# Patient Record
Sex: Male | Born: 1935 | Race: White | Hispanic: No | Marital: Married | Smoking: Former smoker
Health system: Southern US, Community
[De-identification: ages and names within clinical notes are randomized; demographics above are authoritative.]

## PROBLEM LIST (undated history)

## (undated) DIAGNOSIS — I4510 Unspecified right bundle-branch block: Secondary | ICD-10-CM

## (undated) DIAGNOSIS — E669 Obesity, unspecified: Secondary | ICD-10-CM

## (undated) DIAGNOSIS — M5135 Other intervertebral disc degeneration, thoracolumbar region: Secondary | ICD-10-CM

## (undated) DIAGNOSIS — Z7901 Long term (current) use of anticoagulants: Secondary | ICD-10-CM

## (undated) DIAGNOSIS — R972 Elevated prostate specific antigen [PSA]: Secondary | ICD-10-CM

## (undated) DIAGNOSIS — Z8739 Personal history of other diseases of the musculoskeletal system and connective tissue: Secondary | ICD-10-CM

## (undated) DIAGNOSIS — I2510 Atherosclerotic heart disease of native coronary artery without angina pectoris: Secondary | ICD-10-CM

## (undated) DIAGNOSIS — E785 Hyperlipidemia, unspecified: Secondary | ICD-10-CM

## (undated) DIAGNOSIS — N138 Other obstructive and reflux uropathy: Secondary | ICD-10-CM

## (undated) DIAGNOSIS — N400 Enlarged prostate without lower urinary tract symptoms: Secondary | ICD-10-CM

## (undated) DIAGNOSIS — M47814 Spondylosis without myelopathy or radiculopathy, thoracic region: Secondary | ICD-10-CM

## (undated) DIAGNOSIS — N401 Enlarged prostate with lower urinary tract symptoms: Secondary | ICD-10-CM

## (undated) DIAGNOSIS — I4891 Unspecified atrial fibrillation: Secondary | ICD-10-CM

## (undated) DIAGNOSIS — I10 Essential (primary) hypertension: Secondary | ICD-10-CM

## (undated) DIAGNOSIS — D369 Benign neoplasm, unspecified site: Secondary | ICD-10-CM

## (undated) DIAGNOSIS — J309 Allergic rhinitis, unspecified: Secondary | ICD-10-CM

## (undated) DIAGNOSIS — M51369 Other intervertebral disc degeneration, lumbar region without mention of lumbar back pain or lower extremity pain: Secondary | ICD-10-CM

## (undated) DIAGNOSIS — K4090 Unilateral inguinal hernia, without obstruction or gangrene, not specified as recurrent: Secondary | ICD-10-CM

## (undated) DIAGNOSIS — I82409 Acute embolism and thrombosis of unspecified deep veins of unspecified lower extremity: Secondary | ICD-10-CM

## (undated) DIAGNOSIS — M5136 Other intervertebral disc degeneration, lumbar region: Secondary | ICD-10-CM

## (undated) DIAGNOSIS — I872 Venous insufficiency (chronic) (peripheral): Secondary | ICD-10-CM

## (undated) DIAGNOSIS — M1990 Unspecified osteoarthritis, unspecified site: Secondary | ICD-10-CM

## (undated) DIAGNOSIS — E882 Lipomatosis, not elsewhere classified: Secondary | ICD-10-CM

## (undated) DIAGNOSIS — R61 Generalized hyperhidrosis: Secondary | ICD-10-CM

## (undated) DIAGNOSIS — N281 Cyst of kidney, acquired: Secondary | ICD-10-CM

## (undated) DIAGNOSIS — D1720 Benign lipomatous neoplasm of skin and subcutaneous tissue of unspecified limb: Secondary | ICD-10-CM

## (undated) DIAGNOSIS — K219 Gastro-esophageal reflux disease without esophagitis: Secondary | ICD-10-CM

## (undated) DIAGNOSIS — K5790 Diverticulosis of intestine, part unspecified, without perforation or abscess without bleeding: Secondary | ICD-10-CM

## (undated) DIAGNOSIS — D649 Anemia, unspecified: Secondary | ICD-10-CM

## (undated) DIAGNOSIS — I700 Atherosclerosis of aorta: Secondary | ICD-10-CM

## (undated) DIAGNOSIS — J439 Emphysema, unspecified: Secondary | ICD-10-CM

## (undated) DIAGNOSIS — N210 Calculus in bladder: Secondary | ICD-10-CM

## (undated) DIAGNOSIS — C3490 Malignant neoplasm of unspecified part of unspecified bronchus or lung: Secondary | ICD-10-CM

## (undated) DIAGNOSIS — R0609 Other forms of dyspnea: Secondary | ICD-10-CM

## (undated) DIAGNOSIS — L570 Actinic keratosis: Secondary | ICD-10-CM

## (undated) DIAGNOSIS — I429 Cardiomyopathy, unspecified: Secondary | ICD-10-CM

## (undated) HISTORY — DX: Elevated prostate specific antigen (PSA): R97.20

## (undated) HISTORY — DX: Acute embolism and thrombosis of unspecified deep veins of unspecified lower extremity: I82.409

## (undated) HISTORY — DX: Malignant neoplasm of unspecified part of unspecified bronchus or lung: C34.90

## (undated) HISTORY — DX: Anemia, unspecified: D64.9

## (undated) HISTORY — DX: Generalized hyperhidrosis: R61

## (undated) HISTORY — DX: Lipomatosis, not elsewhere classified: E88.2

## (undated) HISTORY — DX: Personal history of other diseases of the musculoskeletal system and connective tissue: Z87.39

## (undated) HISTORY — PX: TONSILLECTOMY AND ADENOIDECTOMY: SUR1326

## (undated) HISTORY — DX: Essential (primary) hypertension: I10

## (undated) HISTORY — DX: Actinic keratosis: L57.0

## (undated) HISTORY — DX: Benign lipomatous neoplasm of skin and subcutaneous tissue of unspecified limb: D17.20

## (undated) HISTORY — PX: PROSTATE BIOPSY: SHX241

## (undated) HISTORY — PX: DENTAL SURGERY: SHX609

## (undated) HISTORY — PX: GUM SURGERY: SHX658

## (undated) HISTORY — DX: Obesity, unspecified: E66.9

## (undated) MED FILL — Dexamethasone Sodium Phosphate Inj 100 MG/10ML: INTRAMUSCULAR | Qty: 1 | Status: AC

---

## 1995-08-26 HISTORY — PX: OTHER SURGICAL HISTORY: SHX169

## 2004-10-01 ENCOUNTER — Encounter: Payer: Self-pay | Admitting: Family Medicine

## 2004-10-23 ENCOUNTER — Encounter: Payer: Self-pay | Admitting: Family Medicine

## 2004-11-23 ENCOUNTER — Encounter: Payer: Self-pay | Admitting: Family Medicine

## 2005-04-11 ENCOUNTER — Ambulatory Visit: Payer: Self-pay | Admitting: Internal Medicine

## 2005-04-11 LAB — HM COLONOSCOPY

## 2005-09-22 ENCOUNTER — Ambulatory Visit: Payer: Self-pay | Admitting: *Deleted

## 2007-04-07 ENCOUNTER — Ambulatory Visit: Payer: Self-pay | Admitting: Family Medicine

## 2010-01-24 ENCOUNTER — Ambulatory Visit: Payer: Self-pay | Admitting: Family Medicine

## 2011-10-01 ENCOUNTER — Ambulatory Visit: Payer: Self-pay | Admitting: Ophthalmology

## 2013-01-14 ENCOUNTER — Ambulatory Visit: Payer: Self-pay | Admitting: Family Medicine

## 2013-06-03 ENCOUNTER — Ambulatory Visit: Payer: Self-pay | Admitting: Family Medicine

## 2013-06-30 ENCOUNTER — Ambulatory Visit: Payer: Self-pay | Admitting: Pain Medicine

## 2013-07-14 ENCOUNTER — Ambulatory Visit: Payer: Self-pay | Admitting: Pain Medicine

## 2013-07-19 ENCOUNTER — Ambulatory Visit: Payer: Self-pay | Admitting: Pain Medicine

## 2013-08-04 ENCOUNTER — Ambulatory Visit: Payer: Self-pay | Admitting: Pain Medicine

## 2014-08-31 DIAGNOSIS — N411 Chronic prostatitis: Secondary | ICD-10-CM | POA: Diagnosis not present

## 2014-08-31 DIAGNOSIS — N400 Enlarged prostate without lower urinary tract symptoms: Secondary | ICD-10-CM | POA: Diagnosis not present

## 2014-08-31 DIAGNOSIS — I82409 Acute embolism and thrombosis of unspecified deep veins of unspecified lower extremity: Secondary | ICD-10-CM | POA: Diagnosis not present

## 2014-08-31 DIAGNOSIS — I10 Essential (primary) hypertension: Secondary | ICD-10-CM | POA: Diagnosis not present

## 2014-11-06 DIAGNOSIS — I480 Paroxysmal atrial fibrillation: Secondary | ICD-10-CM | POA: Diagnosis not present

## 2014-11-06 DIAGNOSIS — I82402 Acute embolism and thrombosis of unspecified deep veins of left lower extremity: Secondary | ICD-10-CM | POA: Diagnosis not present

## 2014-11-06 DIAGNOSIS — I38 Endocarditis, valve unspecified: Secondary | ICD-10-CM | POA: Diagnosis not present

## 2014-11-06 DIAGNOSIS — I10 Essential (primary) hypertension: Secondary | ICD-10-CM | POA: Diagnosis not present

## 2014-11-07 DIAGNOSIS — I82409 Acute embolism and thrombosis of unspecified deep veins of unspecified lower extremity: Secondary | ICD-10-CM | POA: Diagnosis not present

## 2014-11-07 DIAGNOSIS — N411 Chronic prostatitis: Secondary | ICD-10-CM | POA: Diagnosis not present

## 2014-11-07 DIAGNOSIS — Z0000 Encounter for general adult medical examination without abnormal findings: Secondary | ICD-10-CM | POA: Diagnosis not present

## 2014-11-07 DIAGNOSIS — N400 Enlarged prostate without lower urinary tract symptoms: Secondary | ICD-10-CM | POA: Diagnosis not present

## 2014-11-28 ENCOUNTER — Ambulatory Visit
Admit: 2014-11-28 | Disposition: A | Discharge status: Home / Self Care | Payer: Self-pay | Attending: Family Medicine | Admitting: Family Medicine

## 2014-11-28 DIAGNOSIS — I82409 Acute embolism and thrombosis of unspecified deep veins of unspecified lower extremity: Secondary | ICD-10-CM | POA: Diagnosis not present

## 2014-11-28 DIAGNOSIS — R05 Cough: Secondary | ICD-10-CM | POA: Diagnosis not present

## 2014-11-28 DIAGNOSIS — J189 Pneumonia, unspecified organism: Secondary | ICD-10-CM | POA: Diagnosis not present

## 2014-11-28 DIAGNOSIS — R5381 Other malaise: Secondary | ICD-10-CM | POA: Diagnosis not present

## 2014-12-14 DIAGNOSIS — J189 Pneumonia, unspecified organism: Secondary | ICD-10-CM | POA: Diagnosis not present

## 2014-12-14 DIAGNOSIS — R5381 Other malaise: Secondary | ICD-10-CM | POA: Diagnosis not present

## 2014-12-14 DIAGNOSIS — I82409 Acute embolism and thrombosis of unspecified deep veins of unspecified lower extremity: Secondary | ICD-10-CM | POA: Diagnosis not present

## 2015-01-11 DIAGNOSIS — I4891 Unspecified atrial fibrillation: Secondary | ICD-10-CM | POA: Diagnosis not present

## 2015-01-11 DIAGNOSIS — I82409 Acute embolism and thrombosis of unspecified deep veins of unspecified lower extremity: Secondary | ICD-10-CM | POA: Diagnosis not present

## 2015-01-11 DIAGNOSIS — J189 Pneumonia, unspecified organism: Secondary | ICD-10-CM | POA: Diagnosis not present

## 2015-01-24 ENCOUNTER — Other Ambulatory Visit: Payer: Self-pay | Admitting: Family Medicine

## 2015-01-24 DIAGNOSIS — I480 Paroxysmal atrial fibrillation: Secondary | ICD-10-CM | POA: Diagnosis not present

## 2015-01-25 ENCOUNTER — Telehealth: Payer: Self-pay

## 2015-01-25 LAB — PROTIME-INR
INR: 2.1 — AB (ref 0.8–1.2)
Prothrombin Time: 21.7 s — ABNORMAL HIGH (ref 9.1–12.0)

## 2015-01-25 NOTE — Telephone Encounter (Signed)
Tried to call pt. Left message to call back.

## 2015-01-25 NOTE — Telephone Encounter (Signed)
INR 2.1--same dose--PT 1 month.

## 2015-01-25 NOTE — Telephone Encounter (Signed)
PT/INR  Results    PT 21.7    INR 2.1

## 2015-01-26 NOTE — Telephone Encounter (Signed)
Pt returned call. Thanks TNP °

## 2015-01-26 NOTE — Telephone Encounter (Signed)
Pt verbalized fully understanding of MD's instructions, transferred pt to front desk to make appointment in for weeks.

## 2015-01-30 ENCOUNTER — Telehealth: Payer: Self-pay

## 2015-01-30 NOTE — Telephone Encounter (Signed)
Spoke with patient and patient states he was already advised of this, thank you-aa

## 2015-01-30 NOTE — Telephone Encounter (Signed)
-----   Message from Jerrol Banana., MD sent at 01/30/2015  7:18 AM EDT ----- INR good--same dose --repeat PT 1 month

## 2015-02-28 DIAGNOSIS — N411 Chronic prostatitis: Secondary | ICD-10-CM | POA: Insufficient documentation

## 2015-02-28 DIAGNOSIS — N400 Enlarged prostate without lower urinary tract symptoms: Secondary | ICD-10-CM | POA: Insufficient documentation

## 2015-02-28 DIAGNOSIS — I480 Paroxysmal atrial fibrillation: Secondary | ICD-10-CM | POA: Insufficient documentation

## 2015-02-28 DIAGNOSIS — M4800 Spinal stenosis, site unspecified: Secondary | ICD-10-CM | POA: Insufficient documentation

## 2015-02-28 DIAGNOSIS — R972 Elevated prostate specific antigen [PSA]: Secondary | ICD-10-CM | POA: Insufficient documentation

## 2015-02-28 DIAGNOSIS — M549 Dorsalgia, unspecified: Secondary | ICD-10-CM

## 2015-02-28 DIAGNOSIS — M1990 Unspecified osteoarthritis, unspecified site: Secondary | ICD-10-CM | POA: Insufficient documentation

## 2015-02-28 DIAGNOSIS — R5381 Other malaise: Secondary | ICD-10-CM | POA: Insufficient documentation

## 2015-02-28 DIAGNOSIS — I82409 Acute embolism and thrombosis of unspecified deep veins of unspecified lower extremity: Secondary | ICD-10-CM | POA: Insufficient documentation

## 2015-02-28 DIAGNOSIS — Z709 Sex counseling, unspecified: Secondary | ICD-10-CM | POA: Insufficient documentation

## 2015-02-28 DIAGNOSIS — E669 Obesity, unspecified: Secondary | ICD-10-CM | POA: Insufficient documentation

## 2015-02-28 DIAGNOSIS — D649 Anemia, unspecified: Secondary | ICD-10-CM | POA: Insufficient documentation

## 2015-02-28 DIAGNOSIS — G8929 Other chronic pain: Secondary | ICD-10-CM | POA: Insufficient documentation

## 2015-02-28 DIAGNOSIS — J309 Allergic rhinitis, unspecified: Secondary | ICD-10-CM | POA: Insufficient documentation

## 2015-02-28 DIAGNOSIS — J189 Pneumonia, unspecified organism: Secondary | ICD-10-CM | POA: Insufficient documentation

## 2015-02-28 DIAGNOSIS — I10 Essential (primary) hypertension: Secondary | ICD-10-CM | POA: Insufficient documentation

## 2015-02-28 DIAGNOSIS — M9979 Connective tissue and disc stenosis of intervertebral foramina of abdomen and other regions: Secondary | ICD-10-CM | POA: Insufficient documentation

## 2015-02-28 DIAGNOSIS — N529 Male erectile dysfunction, unspecified: Secondary | ICD-10-CM | POA: Insufficient documentation

## 2015-02-28 DIAGNOSIS — E882 Lipomatosis, not elsewhere classified: Secondary | ICD-10-CM | POA: Insufficient documentation

## 2015-02-28 DIAGNOSIS — R61 Generalized hyperhidrosis: Secondary | ICD-10-CM | POA: Insufficient documentation

## 2015-02-28 DIAGNOSIS — R5383 Other fatigue: Secondary | ICD-10-CM

## 2015-03-02 ENCOUNTER — Ambulatory Visit (INDEPENDENT_AMBULATORY_CARE_PROVIDER_SITE_OTHER): Payer: Commercial Managed Care - HMO

## 2015-03-02 DIAGNOSIS — I480 Paroxysmal atrial fibrillation: Secondary | ICD-10-CM | POA: Diagnosis not present

## 2015-03-02 DIAGNOSIS — I4891 Unspecified atrial fibrillation: Secondary | ICD-10-CM | POA: Insufficient documentation

## 2015-03-02 DIAGNOSIS — I82409 Acute embolism and thrombosis of unspecified deep veins of unspecified lower extremity: Secondary | ICD-10-CM | POA: Insufficient documentation

## 2015-03-02 DIAGNOSIS — I10 Essential (primary) hypertension: Secondary | ICD-10-CM | POA: Insufficient documentation

## 2015-03-02 DIAGNOSIS — E782 Mixed hyperlipidemia: Secondary | ICD-10-CM | POA: Insufficient documentation

## 2015-03-02 DIAGNOSIS — I38 Endocarditis, valve unspecified: Secondary | ICD-10-CM | POA: Insufficient documentation

## 2015-03-02 LAB — POCT INR
INR: 2.9
PT: 35

## 2015-03-02 NOTE — Patient Instructions (Addendum)
PT/INR  INR 2.9  Continue same dose, Warfarin 5 mg Daily. Come back in 4 weeks for PT/INR. Call if any questions or concerns.

## 2015-03-14 ENCOUNTER — Other Ambulatory Visit: Payer: Self-pay | Admitting: Family Medicine

## 2015-03-16 ENCOUNTER — Other Ambulatory Visit: Payer: Self-pay | Admitting: Family Medicine

## 2015-03-30 ENCOUNTER — Ambulatory Visit (INDEPENDENT_AMBULATORY_CARE_PROVIDER_SITE_OTHER): Payer: Commercial Managed Care - HMO

## 2015-03-30 DIAGNOSIS — I480 Paroxysmal atrial fibrillation: Secondary | ICD-10-CM | POA: Diagnosis not present

## 2015-03-30 LAB — POCT INR
INR: 3.1
PT: 37

## 2015-03-30 NOTE — Patient Instructions (Signed)
PT/INR today  INR 3.1   Hold Coumadin today and then take same dose, coumadin 5 mg daily. Come back in 2 weeks. Call if any questions or concerns.

## 2015-04-27 ENCOUNTER — Ambulatory Visit (INDEPENDENT_AMBULATORY_CARE_PROVIDER_SITE_OTHER): Payer: Commercial Managed Care - HMO

## 2015-04-27 DIAGNOSIS — I480 Paroxysmal atrial fibrillation: Secondary | ICD-10-CM

## 2015-04-27 LAB — POCT INR: INR: 4

## 2015-04-27 MED ORDER — WARFARIN SODIUM 4 MG PO TABS: 4.0000 mg | ORAL_TABLET | Freq: Once | ORAL | Status: DC

## 2015-04-27 NOTE — Patient Instructions (Signed)
Hold Coumadin for 2 days (Today and Tomorrow) and then take 4 mg daily except 5 mg on Mondays and Fridays.Call if any questions or concerns. Come back in 2 weeks.

## 2015-05-09 DIAGNOSIS — E782 Mixed hyperlipidemia: Secondary | ICD-10-CM | POA: Diagnosis not present

## 2015-05-09 DIAGNOSIS — I482 Chronic atrial fibrillation: Secondary | ICD-10-CM | POA: Diagnosis not present

## 2015-05-09 DIAGNOSIS — I10 Essential (primary) hypertension: Secondary | ICD-10-CM | POA: Diagnosis not present

## 2015-05-11 ENCOUNTER — Other Ambulatory Visit: Payer: Self-pay

## 2015-05-11 ENCOUNTER — Ambulatory Visit (INDEPENDENT_AMBULATORY_CARE_PROVIDER_SITE_OTHER): Payer: Commercial Managed Care - HMO

## 2015-05-11 DIAGNOSIS — Z23 Encounter for immunization: Secondary | ICD-10-CM

## 2015-05-11 DIAGNOSIS — I480 Paroxysmal atrial fibrillation: Secondary | ICD-10-CM | POA: Diagnosis not present

## 2015-05-11 LAB — POCT INR
INR: 2
PT: 23.7

## 2015-05-11 MED ORDER — WARFARIN SODIUM 4 MG PO TABS: 4.0000 mg | ORAL_TABLET | Freq: Once | ORAL | Status: DC

## 2015-05-11 NOTE — Patient Instructions (Signed)
Take 4 mg daily except 5 mg on Mondays, Wednesday and Fridays.Call if any questions or concerns. Come back in 2 weeks.

## 2015-05-11 NOTE — Telephone Encounter (Signed)
Pt is requesting a refill with [facility].   Thanks,

## 2015-05-25 ENCOUNTER — Ambulatory Visit (INDEPENDENT_AMBULATORY_CARE_PROVIDER_SITE_OTHER): Payer: Commercial Managed Care - HMO

## 2015-05-25 DIAGNOSIS — I480 Paroxysmal atrial fibrillation: Secondary | ICD-10-CM

## 2015-05-25 LAB — POCT INR
INR: 2.7
PT: 32.8

## 2015-05-25 NOTE — Patient Instructions (Signed)
Take 4 mg daily except 5 mg on Mondays, Wednesday and Fridays.Call if any questions or concerns. Come back in 4 weeks.

## 2015-06-22 ENCOUNTER — Ambulatory Visit (INDEPENDENT_AMBULATORY_CARE_PROVIDER_SITE_OTHER): Payer: Commercial Managed Care - HMO

## 2015-06-22 DIAGNOSIS — I480 Paroxysmal atrial fibrillation: Secondary | ICD-10-CM

## 2015-06-22 LAB — POCT INR
INR: 3
PT: 36.3

## 2015-06-22 NOTE — Patient Instructions (Signed)
Anticoagulation Dose Instructions as of 06/22/2015      Dorene Grebe Tue Wed Thu Fri Sat   New Dose 4 mg 5 mg 4 mg 4 mg 4 mg 5 mg 4 mg    Description        Take 4 mg daily except 5 mg on Mondays and Fridays.Call if any questions or concerns. Come back in 4 weeks.

## 2015-06-25 DIAGNOSIS — Z961 Presence of intraocular lens: Secondary | ICD-10-CM | POA: Diagnosis not present

## 2015-07-25 ENCOUNTER — Telehealth: Payer: Self-pay

## 2015-07-25 ENCOUNTER — Ambulatory Visit: Payer: Self-pay

## 2015-07-25 ENCOUNTER — Other Ambulatory Visit: Payer: Self-pay

## 2015-07-25 ENCOUNTER — Ambulatory Visit (INDEPENDENT_AMBULATORY_CARE_PROVIDER_SITE_OTHER): Payer: Commercial Managed Care - HMO

## 2015-07-25 DIAGNOSIS — I480 Paroxysmal atrial fibrillation: Secondary | ICD-10-CM

## 2015-07-25 LAB — POCT INR
INR: 2.5
PT: 29.7

## 2015-07-25 MED ORDER — WARFARIN SODIUM 4 MG PO TABS: 4.0000 mg | ORAL_TABLET | Freq: Once | ORAL | Status: DC

## 2015-07-25 NOTE — Patient Instructions (Signed)
Take 4 mg daily except 5 mg on Mondays and Fridays.Call if any questions or concerns. Come back in 4 weeks.

## 2015-07-25 NOTE — Telephone Encounter (Signed)
Patient is requesting a refill on Warfarin 4 mg tablet be sent to [facility].

## 2015-07-25 NOTE — Progress Notes (Signed)
Patient ID: Casey Gallegos, male   DOB: 05-01-1935, 80 y.o.   MRN: [id number] Anticoagulation Dose Instructions as of 07/25/2015      Dorene Grebe Tue Wed Thu Fri Sat   New Dose 4 mg 5 mg 4 mg 4 mg 4 mg 5 mg 4 mg    Description        Take 4 mg daily except 5 mg on Mondays and Fridays.Call if any questions or concerns. Come back in 4 weeks.

## 2015-08-24 ENCOUNTER — Ambulatory Visit (INDEPENDENT_AMBULATORY_CARE_PROVIDER_SITE_OTHER): Payer: Commercial Managed Care - HMO

## 2015-08-24 DIAGNOSIS — I480 Paroxysmal atrial fibrillation: Secondary | ICD-10-CM

## 2015-08-24 LAB — POCT INR
INR: 2.4
PT: 28.6

## 2015-08-24 NOTE — Patient Instructions (Signed)
Anticoagulation Dose Instructions as of 08/24/2015      Casey Gallegos Tue Wed Thu Fri Sat   New Dose 4 mg 5 mg 4 mg 4 mg 4 mg 5 mg 4 mg    Description        Take 4 mg daily except 5 mg on Mondays and Fridays.Call if any questions or concerns. Come back in 4 weeks.

## 2015-09-21 ENCOUNTER — Ambulatory Visit (INDEPENDENT_AMBULATORY_CARE_PROVIDER_SITE_OTHER): Payer: Commercial Managed Care - HMO

## 2015-09-21 DIAGNOSIS — I480 Paroxysmal atrial fibrillation: Secondary | ICD-10-CM | POA: Diagnosis not present

## 2015-09-21 LAB — POCT INR
INR: 2.5
PT: 29.7

## 2015-09-21 NOTE — Patient Instructions (Signed)
Anticoagulation Dose Instructions as of 09/21/2015      Dorene Grebe Tue Wed Thu Fri Sat   New Dose 4 mg 5 mg 4 mg 4 mg 4 mg 5 mg 4 mg    Description        Take 4 mg daily except 5 mg on Mondays and Fridays.Call if any questions or concerns. Come back in 4 weeks.

## 2015-10-18 ENCOUNTER — Ambulatory Visit (INDEPENDENT_AMBULATORY_CARE_PROVIDER_SITE_OTHER): Payer: Commercial Managed Care - HMO | Admitting: Family Medicine

## 2015-10-18 VITALS — BP 142/78 | HR 60 | Temp 97.5°F | Resp 16 | Ht 69.0 in | Wt 218.0 lb

## 2015-10-18 DIAGNOSIS — Z1211 Encounter for screening for malignant neoplasm of colon: Secondary | ICD-10-CM | POA: Diagnosis not present

## 2015-10-18 DIAGNOSIS — M5136 Other intervertebral disc degeneration, lumbar region: Secondary | ICD-10-CM | POA: Diagnosis not present

## 2015-10-18 DIAGNOSIS — I10 Essential (primary) hypertension: Secondary | ICD-10-CM

## 2015-10-18 DIAGNOSIS — Z0000 Encounter for general adult medical examination without abnormal findings: Secondary | ICD-10-CM | POA: Diagnosis not present

## 2015-10-18 DIAGNOSIS — E785 Hyperlipidemia, unspecified: Secondary | ICD-10-CM

## 2015-10-18 MED ORDER — OXYCODONE-ACETAMINOPHEN 5-325 MG PO TABS: 1.0000 | ORAL_TABLET | Freq: Three times a day (TID) | ORAL | Status: DC | PRN

## 2015-10-18 NOTE — Progress Notes (Signed)
Patient ID: Casey Gallegos, male   DOB: 26-Apr-1935, 81 y.o.   MRN: [id number] [name], Male    DOB: [birth year], 81 y.o.   MRN: [id number] Visit Date: 10/18/2015  Today's Provider: [name], MD   Chief Complaint  Patient presents with  . Medicare Wellness   Subjective:   Casey Gallegos is a 81 y.o. male who presents today for his Subsequent Annual Wellness Visit. He feels well. He reports exercising daily. He reports he is sleeping well. He needs refill on his oxycodone for degenerative disc disease.his anticoagulation for atrial fibrillation is going well. No indication or recurrent pulmonary embolus. He needs lab work drawn for follow-up of hypertension and hyperlipidemia. Review of Systems  Constitutional: Negative.   HENT: Positive for hearing loss and tinnitus.   Eyes: Negative.   Respiratory: Negative.   Cardiovascular: Positive for leg swelling.  Gastrointestinal: Negative.   Endocrine: Negative.   Genitourinary: Negative.   Musculoskeletal: Positive for back pain.  Skin: Negative.   Allergic/Immunologic: Negative.   Neurological: Negative.   Hematological: Negative.   Psychiatric/Behavioral: Negative.     Patient Active Problem List   Diagnosis Date Noted  . A-fib ([place]) 03/02/2015  . Deep vein thrombosis ([place]) 03/02/2015  . Essential (primary) hypertension 03/02/2015  . Combined fat and carbohydrate induced hyperlipemia 03/02/2015  . Heart valve disease 03/02/2015  . Allergic rhinitis 02/28/2015  . Absolute anemia 02/28/2015  . 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 Back pain, chronic 02/28/2015  . Sex counseling 02/28/2015  . Narrowing of intervertebral disc space 02/28/2015  . Acute thromboembolism of deep veins of lower extremity ([place]) 02/28/2015  . ED (erectile dysfunction) of organic origin 02/28/2015  . Abnormal prostate specific antigen 02/28/2015  . BP (high blood pressure) 02/28/2015  . Lipomatosis 02/28/2015  . Malaise and fatigue  02/28/2015  . Generalized hyperhidrosis 02/28/2015  . Arthritis, degenerative 02/28/2015  . Adiposity 02/28/2015  . AF (paroxysmal atrial fibrillation) ([place]) 02/28/2015  . Chronic prostatitis 02/28/2015  . Spinal stenosis 02/28/2015  . Atypical pneumonia 02/28/2015    Social History   Social History  . Marital Status: Married    Spouse Name: N/A  . Number of Children: N/A  . Years of Education: N/A   Occupational History  . Not on file.   Social History Main Topics  . Smoking status: Former Smoker    Quit date: 08/26/1971  . Smokeless tobacco: Not on file     Comment: Smoked for about 20 years.  . Alcohol Use: Yes     Comment: Occasional  . Drug Use: Not on file  . Sexual Activity: Not on file   Other Topics Concern  . Not on file   Social History Narrative  . No narrative on file    Past Surgical History  Procedure Laterality Date  . Bilateral radical keratotomy Bilateral 1997  . Gum surgery    . Dental surgery      Patient had implants  . Tonsillectomy and adenoidectomy  age 53  . L4 - s1 decompression Left     Left-sided    His family history includes CAD in his father; Cancer in his mother; Diabetes in his son; Heart attack in his father; Hypertension in his sister.    Outpatient Prescriptions Prior to Visit  Medication Sig Dispense Refill  . amLODipine (NORVASC) 5 MG tablet TAKE 1 TABLET EVERY DAY 90 tablet 3  . benazepril (LOTENSIN) 40 MG tablet TAKE 1 TABLET EVERY DAY  90 tablet 3  . carvedilol (COREG) 6.25 MG tablet TAKE 1 TABLET TWICE DAILY 180 tablet 3  . doxazosin (CARDURA) 8 MG tablet TAKE 1 TABLET EVERY DAY 90 tablet 3  . gabapentin (NEURONTIN) 300 MG capsule Take by mouth.    . MULTIPLE VITAMIN PO Take by mouth.    . sildenafil (VIAGRA) 100 MG tablet Take by mouth.    . traMADol (ULTRAM) 50 MG tablet Take 50 mg by mouth.    . triamterene-hydrochlorothiazide (MAXZIDE-25) 37.5-25 MG per tablet TAKE 1 TABLET EVERY DAY 90 tablet 3  . warfarin  (COUMADIN) 1 MG tablet Take by mouth.    . warfarin (COUMADIN) 4 MG tablet Take 1 tablet (4 mg total) by mouth one time only at 6 PM. 90 tablet 3  . warfarin (COUMADIN) 5 MG tablet TAKE 1 TABLET EVERY DAY 90 tablet 3   No facility-administered medications prior to visit.    Allergies  Allergen Reactions  . Doxycycline     Sun sensitivity    Patient Care Team: Jerrol Banana., MD as PCP - General (Family Medicine)  Objective:   Vitals:  Filed Vitals:   10/18/15 1005  BP: 142/78  Pulse: 60  Temp: 97.5 F (36.4 C)  TempSrc: Oral  Resp: 16  Height: '5\' 9"'$  (1.753 m)  Weight: 218 lb (98.884 kg)    Physical Exam  Constitutional: He is oriented to person, place, and time. He appears well-developed and well-nourished.  HENT:  Head: Normocephalic and atraumatic.  Right Ear: External ear normal.  Left Ear: External ear normal.  Nose: Nose normal.  Mouth/Throat: Oropharynx is clear and moist.  Eyes: Conjunctivae and EOM are normal. Pupils are equal, round, and reactive to light.  Neck: Normal range of motion. Neck supple.  Cardiovascular: Normal rate, regular rhythm, normal heart sounds and intact distal pulses.   Pulmonary/Chest: Effort normal and breath sounds normal.  Abdominal: Soft. Bowel sounds are normal.  Musculoskeletal: Normal range of motion.  Neurological: He is alert and oriented to person, place, and time.  Skin: Skin is warm and dry.  Psychiatric: He has a normal mood and affect. His behavior is normal. Judgment and thought content normal.    Activities of Daily Living In your present state of health, do you have any difficulty performing the following activities: 10/18/2015  Hearing? Y  Vision? N  Difficulty concentrating or making decisions? N  Walking or climbing stairs? N  Dressing or bathing? N  Doing errands, shopping? N    Fall Risk Assessment Fall Risk  10/18/2015  Falls in the past year? No     Depression Screen PHQ 2/9 Scores 10/18/2015   PHQ - 2 Score 0    Cognitive Testing - 6-CIT    Year: 0 4 points  Month: 0 3 points  Memorize "Pia Mau, [address], [place]"  Time (within 1 hour:) 0 3 points  Count backwards from 20: 0 2 4 points  Name months of year: 0 2 4 points  Repeat Address: 0 '2 4 6 8 10 '$ points   Total Score: 5/28  Interpretation : Normal (0-7) Abnormal (8-28)    Assessment & Plan:     Annual Wellness Visit  Reviewed patient's Family Medical History Reviewed and updated list of patient's medical providers Assessment of cognitive impairment was done Assessed patient's functional ability Established a written schedule for health screening [place] Completed and Reviewed  Exercise Activities and Dietary recommendations Goals    None  Immunization History  Administered Date(s) Administered  . Influenza, High Dose Seasonal PF 05/11/2015  . Pneumococcal Conjugate-13 01/09/2014  . Pneumococcal Polysaccharide-23 08/08/2004  . Td 10/25/2008    Health Maintenance  Topic Date Due  . ZOSTAVAX  10/04/1996  . INFLUENZA VACCINE  03/25/2016  . TETANUS/TDAP  10/26/2018  . PNA vac Low Risk Adult  Completed      Discussed health benefits of physical activity, and encouraged him to engage in regular exercise appropriate for his age and condition.  Chronic low back pain secondary due to degenerative disc disease Oxycodone  Hypertension Atrial fibrillation Hyperlipidemia DVT/pulmonary embolus Labs drawn for follow-up of above problems. I have done the exam and reviewed the above chart and it is accurate to the best of my knowledge.  Miguel Aschoff MD [facility] 10/18/2015 10:08 AM  ------------------------------------------------------------------------------------------------------------

## 2015-10-19 LAB — LIPID PANEL WITH LDL/HDL RATIO
CHOLESTEROL TOTAL: 178 mg/dL (ref 100–199)
HDL: 67 mg/dL (ref >39)
LDL CALC: 97 mg/dL (ref 0–99)
LDl/HDL Ratio: 1.4 ratio units (ref 0.0–3.6)
TRIGLYCERIDES: 72 mg/dL (ref 0–149)
VLDL CHOLESTEROL CAL: 14 mg/dL (ref 5–40)

## 2015-10-19 LAB — COMPREHENSIVE METABOLIC PANEL
A/G RATIO: 1.7 (ref 1.1–2.5)
ALBUMIN: 4.1 g/dL (ref 3.5–4.8)
ALK PHOS: 67 IU/L (ref 39–117)
ALT: 20 IU/L (ref 0–44)
AST: 22 IU/L (ref 0–40)
BILIRUBIN TOTAL: 0.8 mg/dL (ref 0.0–1.2)
BUN / CREAT RATIO: 21 (ref 10–22)
BUN: 23 mg/dL (ref 8–27)
CHLORIDE: 105 mmol/L (ref 96–106)
CO2: 25 mmol/L (ref 18–29)
Calcium: 9.1 mg/dL (ref 8.6–10.2)
Creatinine, Ser: 1.12 mg/dL (ref 0.76–1.27)
GFR calc non Af Amer: 62 mL/min/{1.73_m2} (ref >59)
GFR, EST AFRICAN AMERICAN: 72 mL/min/{1.73_m2} (ref >59)
GLOBULIN, TOTAL: 2.4 g/dL (ref 1.5–4.5)
Glucose: 114 mg/dL — ABNORMAL HIGH (ref 65–99)
Potassium: 5.1 mmol/L (ref 3.5–5.2)
SODIUM: 145 mmol/L — AB (ref 134–144)
Total Protein: 6.5 g/dL (ref 6.0–8.5)

## 2015-10-19 LAB — CBC WITH DIFFERENTIAL/PLATELET
BASOS: 1 %
Basophils Absolute: 0 10*3/uL (ref 0.0–0.2)
EOS (ABSOLUTE): 0.1 10*3/uL (ref 0.0–0.4)
EOS: 2 %
HEMATOCRIT: 39.5 % (ref 37.5–51.0)
HEMOGLOBIN: 13.3 g/dL (ref 12.6–17.7)
IMMATURE GRANS (ABS): 0 10*3/uL (ref 0.0–0.1)
Immature Granulocytes: 0 %
LYMPHS ABS: 1.5 10*3/uL (ref 0.7–3.1)
Lymphs: 29 %
MCH: 30.9 pg (ref 26.6–33.0)
MCHC: 33.7 g/dL (ref 31.5–35.7)
MCV: 92 fL (ref 79–97)
MONOCYTES: 11 %
Monocytes Absolute: 0.5 10*3/uL (ref 0.1–0.9)
NEUTROS ABS: 2.8 10*3/uL (ref 1.4–7.0)
Neutrophils: 57 %
Platelets: 162 10*3/uL (ref 150–379)
RBC: 4.3 x10E6/uL (ref 4.14–5.80)
RDW: 14.4 % (ref 12.3–15.4)
WBC: 5 10*3/uL (ref 3.4–10.8)

## 2015-10-19 LAB — TSH: TSH: 1.83 u[IU]/mL (ref 0.450–4.500)

## 2015-10-22 ENCOUNTER — Ambulatory Visit (INDEPENDENT_AMBULATORY_CARE_PROVIDER_SITE_OTHER): Payer: Commercial Managed Care - HMO

## 2015-10-22 DIAGNOSIS — I480 Paroxysmal atrial fibrillation: Secondary | ICD-10-CM

## 2015-10-22 LAB — POCT INR
INR: 2
PT: 23.7

## 2015-10-22 NOTE — Patient Instructions (Signed)
Anticoagulation Dose Instructions as of 10/22/2015      Dorene Grebe Tue Wed Thu Fri Sat   New Dose 4 mg 5 mg 4 mg 5 mg 4 mg 5 mg 4 mg    Description        Take 4 mg daily except 5 mg on Mondays, Wednesday and Fridays.Call if any questions or concerns. Come back in 3 weeks.

## 2015-10-22 NOTE — Progress Notes (Signed)
Advised  ED 

## 2015-10-23 ENCOUNTER — Telehealth: Payer: Self-pay

## 2015-10-23 NOTE — Telephone Encounter (Signed)
Patient was not seen.

## 2015-11-07 DIAGNOSIS — I429 Cardiomyopathy, unspecified: Secondary | ICD-10-CM | POA: Diagnosis not present

## 2015-11-07 DIAGNOSIS — I420 Dilated cardiomyopathy: Secondary | ICD-10-CM | POA: Insufficient documentation

## 2015-11-07 DIAGNOSIS — I10 Essential (primary) hypertension: Secondary | ICD-10-CM | POA: Diagnosis not present

## 2015-11-07 DIAGNOSIS — I482 Chronic atrial fibrillation: Secondary | ICD-10-CM | POA: Diagnosis not present

## 2015-11-09 ENCOUNTER — Telehealth: Payer: Self-pay

## 2015-11-09 NOTE — Telephone Encounter (Signed)
Was pt advised of lab results-aa

## 2015-11-15 DIAGNOSIS — Z86010 Personal history of colonic polyps: Secondary | ICD-10-CM | POA: Diagnosis not present

## 2015-11-15 DIAGNOSIS — Z1211 Encounter for screening for malignant neoplasm of colon: Secondary | ICD-10-CM | POA: Diagnosis not present

## 2015-11-16 ENCOUNTER — Ambulatory Visit (INDEPENDENT_AMBULATORY_CARE_PROVIDER_SITE_OTHER): Payer: Commercial Managed Care - HMO

## 2015-11-16 DIAGNOSIS — I480 Paroxysmal atrial fibrillation: Secondary | ICD-10-CM | POA: Diagnosis not present

## 2015-11-16 LAB — POCT INR
INR: 1.9
PT: 23.4

## 2015-11-16 NOTE — Patient Instructions (Signed)
Anticoagulation Dose Instructions as of 11/16/2015      Casey Gallegos Tue Wed Thu Fri Sat   New Dose 0 mg 0 mg 0 mg 0 mg 0 mg 0 mg 0 mg    Description        5 mg daily except 4 mg on M-W-F, f/u in 2 weeks

## 2015-11-22 DIAGNOSIS — I38 Endocarditis, valve unspecified: Secondary | ICD-10-CM

## 2015-11-22 DIAGNOSIS — I429 Cardiomyopathy, unspecified: Secondary | ICD-10-CM | POA: Diagnosis not present

## 2015-11-22 DIAGNOSIS — I428 Other cardiomyopathies: Secondary | ICD-10-CM

## 2015-11-22 HISTORY — DX: Other cardiomyopathies: I42.8

## 2015-11-22 HISTORY — DX: Endocarditis, valve unspecified: I38

## 2015-11-27 DIAGNOSIS — I482 Chronic atrial fibrillation: Secondary | ICD-10-CM | POA: Diagnosis not present

## 2015-11-27 DIAGNOSIS — I071 Rheumatic tricuspid insufficiency: Secondary | ICD-10-CM | POA: Diagnosis not present

## 2015-11-27 DIAGNOSIS — I480 Paroxysmal atrial fibrillation: Secondary | ICD-10-CM | POA: Diagnosis not present

## 2015-11-27 DIAGNOSIS — I429 Cardiomyopathy, unspecified: Secondary | ICD-10-CM | POA: Diagnosis not present

## 2015-11-27 DIAGNOSIS — I10 Essential (primary) hypertension: Secondary | ICD-10-CM | POA: Diagnosis not present

## 2015-11-30 ENCOUNTER — Ambulatory Visit (INDEPENDENT_AMBULATORY_CARE_PROVIDER_SITE_OTHER): Payer: Commercial Managed Care - HMO

## 2015-11-30 DIAGNOSIS — I480 Paroxysmal atrial fibrillation: Secondary | ICD-10-CM | POA: Diagnosis not present

## 2015-11-30 LAB — POCT INR
INR: 1.9
PT: 22.9

## 2015-11-30 NOTE — Patient Instructions (Signed)
Anticoagulation Dose Instructions as of 11/30/2015      Casey Gallegos Tue Wed Thu Fri Sat   New Dose 0 mg 0 mg 0 mg 0 mg 0 mg 0 mg 0 mg    Description        5 mg daily except 4 mg on M-F, f/u in 2 weeks

## 2015-12-21 ENCOUNTER — Ambulatory Visit (INDEPENDENT_AMBULATORY_CARE_PROVIDER_SITE_OTHER): Payer: Commercial Managed Care - HMO

## 2015-12-21 DIAGNOSIS — I480 Paroxysmal atrial fibrillation: Secondary | ICD-10-CM | POA: Diagnosis not present

## 2015-12-21 LAB — POCT INR
INR: 2.3
PT: 27.3

## 2015-12-21 NOTE — Patient Instructions (Signed)
Anticoagulation Dose Instructions as of 12/21/2015      Dorene Grebe Tue Wed Thu Fri Sat   New Dose 0 mg 0 mg 0 mg 0 mg 0 mg 0 mg 0 mg    Description        5 mg daily except 4 mg on M-F, f/u in 4 weeks

## 2016-01-10 ENCOUNTER — Encounter: Payer: Self-pay | Admitting: *Deleted

## 2016-01-11 ENCOUNTER — Encounter: Payer: Self-pay | Admitting: Anesthesiology

## 2016-01-11 ENCOUNTER — Ambulatory Visit: Payer: Commercial Managed Care - HMO | Admitting: Anesthesiology

## 2016-01-11 ENCOUNTER — Encounter
Admission: RE | Disposition: A | Discharge status: Home / Self Care | Payer: Self-pay | Source: Ambulatory Visit | Attending: Unknown Physician Specialty

## 2016-01-11 ENCOUNTER — Ambulatory Visit
Admission: RE | Admit: 2016-01-11 | Discharge: 2016-01-11 | Disposition: A | Discharge status: Home / Self Care | Payer: Commercial Managed Care - HMO | Source: Ambulatory Visit | Attending: Unknown Physician Specialty | Admitting: Unknown Physician Specialty

## 2016-01-11 DIAGNOSIS — Z7901 Long term (current) use of anticoagulants: Secondary | ICD-10-CM | POA: Diagnosis not present

## 2016-01-11 DIAGNOSIS — Z86718 Personal history of other venous thrombosis and embolism: Secondary | ICD-10-CM | POA: Diagnosis not present

## 2016-01-11 DIAGNOSIS — Z86010 Personal history of colonic polyps: Secondary | ICD-10-CM | POA: Diagnosis not present

## 2016-01-11 DIAGNOSIS — K635 Polyp of colon: Secondary | ICD-10-CM | POA: Insufficient documentation

## 2016-01-11 DIAGNOSIS — D122 Benign neoplasm of ascending colon: Secondary | ICD-10-CM | POA: Insufficient documentation

## 2016-01-11 DIAGNOSIS — Z79899 Other long term (current) drug therapy: Secondary | ICD-10-CM | POA: Insufficient documentation

## 2016-01-11 DIAGNOSIS — D120 Benign neoplasm of cecum: Secondary | ICD-10-CM | POA: Diagnosis not present

## 2016-01-11 DIAGNOSIS — K640 First degree hemorrhoids: Secondary | ICD-10-CM | POA: Insufficient documentation

## 2016-01-11 DIAGNOSIS — Z79891 Long term (current) use of opiate analgesic: Secondary | ICD-10-CM | POA: Insufficient documentation

## 2016-01-11 DIAGNOSIS — D123 Benign neoplasm of transverse colon: Secondary | ICD-10-CM | POA: Insufficient documentation

## 2016-01-11 DIAGNOSIS — I10 Essential (primary) hypertension: Secondary | ICD-10-CM | POA: Insufficient documentation

## 2016-01-11 DIAGNOSIS — Z1211 Encounter for screening for malignant neoplasm of colon: Secondary | ICD-10-CM | POA: Insufficient documentation

## 2016-01-11 DIAGNOSIS — Z87891 Personal history of nicotine dependence: Secondary | ICD-10-CM | POA: Diagnosis not present

## 2016-01-11 DIAGNOSIS — D125 Benign neoplasm of sigmoid colon: Secondary | ICD-10-CM | POA: Diagnosis not present

## 2016-01-11 HISTORY — PX: COLONOSCOPY WITH PROPOFOL: SHX5780

## 2016-01-11 LAB — HM COLONOSCOPY

## 2016-01-11 LAB — SURGICAL PATHOLOGY

## 2016-01-11 SURGERY — COLONOSCOPY WITH PROPOFOL
Anesthesia: General

## 2016-01-11 MED ORDER — MIDAZOLAM HCL 2 MG/2ML IJ SOLN
INTRAMUSCULAR | Status: DC | PRN
  Administered 2016-01-11: 1 mg via INTRAVENOUS

## 2016-01-11 MED ORDER — PROPOFOL 500 MG/50ML IV EMUL
INTRAVENOUS | Status: DC | PRN
  Administered 2016-01-11: 120 ug/kg/min via INTRAVENOUS

## 2016-01-11 MED ORDER — SODIUM CHLORIDE 0.9 % IV SOLN
INTRAVENOUS | Status: DC
Start: 2016-01-11 — End: 2016-01-11
  Administered 2016-01-11: 1000 mL via INTRAVENOUS

## 2016-01-11 MED ORDER — FENTANYL CITRATE (PF) 100 MCG/2ML IJ SOLN
INTRAMUSCULAR | Status: DC | PRN
  Administered 2016-01-11: 50 ug via INTRAVENOUS

## 2016-01-11 MED ORDER — LIDOCAINE HCL (PF) 1 % IJ SOLN
2.0000 mL | Freq: Once | INTRAMUSCULAR | Status: AC
  Administered 2016-01-11: 0.03 mL via INTRADERMAL

## 2016-01-11 MED ORDER — LIDOCAINE HCL (PF) 1 % IJ SOLN
INTRAMUSCULAR | Status: AC
  Administered 2016-01-11: 0.03 mL via INTRADERMAL
  Filled 2016-01-11: qty 2

## 2016-01-11 NOTE — Anesthesia Procedure Notes (Signed)
Performed by: COOK-MARTIN, Barry [place] Pre-anesthesia Checklist: Patient identified, Emergency Drugs available, Suction available, Patient being monitored and Timeout performed Patient Re-evaluated:Patient Re-evaluated prior to inductionOxygen Delivery Method: Nasal cannula Preoxygenation: Pre-oxygenation with 100% oxygen Intubation Type: IV induction Placement Confirmation: positive ETCO2 and CO2 detector       

## 2016-01-11 NOTE — Op Note (Signed)
[facility] Patient Name: Casey Gallegos Procedure Date: 01/11/2016 11:50 AM MRN: [id number] Account #: [id number] Date of Birth: [birth year] Admit Type: Outpatient Age: 81 Room: [facility] ENDO ROOM 1 Gender: Male Note Status: Finalized Procedure:            Colonoscopy Indications:          High risk colon cancer surveillance: Personal history                        of colonic polyps Providers:            Manya Silvas, MD Referring MD:         Laney Pastor (Referring MD) Medicines:            Propofol per Anesthesia Complications:        No immediate complications. Procedure:            Pre-Anesthesia Assessment:                       - After reviewing the risks and benefits, the patient                        was deemed in satisfactory condition to undergo the                        procedure.                       After obtaining informed consent, the colonoscope was                        passed under direct vision. Throughout the procedure,                        the patient's blood pressure, pulse, and oxygen                        saturations were monitored continuously. The                        Colonoscope was introduced through the anus and                        advanced to the the cecum, identified by appendiceal                        orifice and ileocecal valve. The colonoscopy was                        performed without difficulty. The patient tolerated the                        procedure well. The quality of the bowel preparation                        was good. Findings:      A diminutive polyp was found in the cecum. The polyp was sessile. The       polyp was removed with a hot snare. Resection and retrieval were       complete.      A small polyp was found in the  ascending colon. The polyp was sessile.       The polyp was removed with a hot snare. Resection and retrieval were       complete.      A diminutive polyp was  found in the transverse colon. The polyp was       sessile. The polyp was removed with a jumbo cold forceps. Resection and       retrieval were complete.      A small polyp was found in the splenic flexure. The polyp was sessile.       The polyp was removed with a hot snare. Resection and retrieval were       complete.      A diminutive polyp was found in the sigmoid colon. The polyp was       sessile. The polyp was removed with a jumbo cold forceps. Resection and       retrieval were complete.      Internal hemorrhoids were found during endoscopy. The hemorrhoids were       small and Grade I (internal hemorrhoids that do not prolapse).      The exam was otherwise without abnormality. Impression:           - One diminutive polyp in the cecum, removed with a hot                        snare. Resected and retrieved.                       - One small polyp in the ascending colon, removed with                        a hot snare. Resected and retrieved.                       - One diminutive polyp in the transverse colon, removed                        with a jumbo cold forceps. Resected and retrieved.                       - One small polyp at the splenic flexure, removed with                        a hot snare. Resected and retrieved.                       - One diminutive polyp in the sigmoid colon, removed                        with a jumbo cold forceps. Resected and retrieved.                       - Internal hemorrhoids.                       - The examination was otherwise normal. Recommendation:       - Await pathology results. Manya Silvas, MD 01/11/2016 12:29:38 PM This report has been signed electronically. Number of Addenda: 0 Note Initiated On: 01/11/2016 11:50 AM Scope Withdrawal Time: 0 hours 20 minutes 56 seconds  Total Procedure Duration: 0  hours 26 minutes 2 seconds       [facility]

## 2016-01-11 NOTE — Anesthesia Postprocedure Evaluation (Signed)
Anesthesia Post Note  [name]  Procedure(s) Performed: Procedure(s) (LRB): COLONOSCOPY WITH PROPOFOL (N/A)  Patient location during evaluation: Endoscopy Anesthesia Type: General Level of consciousness: awake and alert Pain management: pain level controlled Vital Signs Assessment: post-procedure vital signs reviewed and stable Respiratory status: spontaneous breathing, nonlabored ventilation, respiratory function stable and patient connected to nasal cannula oxygen Cardiovascular status: blood pressure returned to baseline and stable Postop Assessment: no signs of nausea or vomiting Anesthetic complications: no    Last Vitals:  Filed Vitals:   01/11/16 1300 01/11/16 1310  BP: 110/67 126/89  Pulse: 67 68  Temp:    Resp: 20 15    Last Pain: There were no vitals filed for this visit.               Martha Clan

## 2016-01-11 NOTE — Transfer of Care (Signed)
Immediate Anesthesia Transfer of Care Note  [name]  Procedure(s) Performed: Procedure(s): COLONOSCOPY WITH PROPOFOL (N/A)  Patient Location: PACU  Anesthesia Type:General  Level of Consciousness: awake and sedated  Airway & Oxygen Therapy: Patient Spontanous Breathing and Patient connected to nasal cannula oxygen  Post-op Assessment: Report given to RN and Post -op Vital signs reviewed and stable  Post vital signs: Reviewed and stable  Last Vitals:  Filed Vitals:   01/11/16 1103  BP: 107/70  Pulse: 75  Temp: 36.6 C  Resp: 16    Last Pain: There were no vitals filed for this visit.       Complications: No apparent anesthesia complications

## 2016-01-11 NOTE — Anesthesia Preprocedure Evaluation (Signed)
Anesthesia Evaluation  Patient identified by MRN, date of birth, ID band Patient awake    Reviewed: Allergy & Precautions, H&P , NPO status , Patient's Chart, lab work & pertinent test results, reviewed documented beta blocker date and time   History of Anesthesia Complications Negative for: history of anesthetic complications  Airway Mallampati: III  TM Distance: >3 FB Neck ROM: full    Dental no notable dental hx. (+) Implants, Missing   Pulmonary neg pulmonary ROS, former smoker,    Pulmonary exam normal breath sounds clear to auscultation       Cardiovascular Exercise Tolerance: Good hypertension, (-) angina+ Peripheral Vascular Disease  (-) CAD, (-) Past MI, (-) Cardiac Stents and (-) CABG Normal cardiovascular exam+ dysrhythmias Atrial Fibrillation (-) Valvular Problems/Murmurs Rhythm:regular Rate:Normal     Neuro/Psych negative neurological ROS  negative psych ROS   GI/Hepatic negative GI ROS, Neg liver ROS,   Endo/Other  negative endocrine ROS  Renal/GU negative Renal ROS  negative genitourinary   Musculoskeletal   Abdominal   Peds  Hematology negative hematology ROS (+)   Anesthesia Other Findings Past Medical History:   H/O degenerative disc disease                                Night sweats                                                 DVT (deep venous thrombosis) (HCC)                             Comment:Monitor with PT/INR. Target 2.0 - 3.0    Anemia                                                       Obesity                                                      Lipomatosis                                                  Hypertension                                                 Reproductive/Obstetrics negative OB ROS                             Anesthesia Physical Anesthesia Plan  ASA: II  Anesthesia Plan: General   Post-op Pain Management:    Induction:    Airway Management Planned:   Additional Equipment:   Intra-op Plan:   Post-operative  Plan:   Informed Consent: I have reviewed the patients History and Physical, chart, labs and discussed the procedure including the risks, benefits and alternatives for the proposed anesthesia with the patient or authorized representative who has indicated his/her understanding and acceptance.   Dental Advisory Given  Plan Discussed with: Anesthesiologist, CRNA and Surgeon  Anesthesia Plan Comments:         Anesthesia Quick Evaluation

## 2016-01-11 NOTE — H&P (Signed)
Primary Care Physician:  [name], MD Primary Gastroenterologist:  Dr. Vira Agar  Pre-Procedure History & Physical: HPI:  Casey Gallegos is a 81 y.o. male is here for an colonoscopy.   Past Medical History  Diagnosis Date  . H/O degenerative disc disease   . Night sweats   . DVT (deep venous thrombosis) (HCC)     Monitor with PT/INR. Target 2.0 - 3.0   . Anemia   . Obesity   . Lipomatosis   . Hypertension     Past Surgical History  Procedure Laterality Date  . Bilateral radical keratotomy Bilateral 1997  . Gum surgery    . Dental surgery      Patient had implants  . Tonsillectomy and adenoidectomy  age 46  . L4 - s1 decompression Left     Left-sided    Prior to Admission medications   Medication Sig Start Date End Date Taking? Authorizing Provider  amLODipine (NORVASC) 5 MG tablet TAKE 1 TABLET EVERY DAY 03/16/15  Yes Jerrol Banana., MD  benazepril (LOTENSIN) 40 MG tablet TAKE 1 TABLET EVERY DAY 03/16/15  Yes Jerrol Banana., MD  carvedilol (COREG) 6.25 MG tablet TAKE 1 TABLET TWICE DAILY 03/16/15  Yes Jerrol Banana., MD  doxazosin (CARDURA) 8 MG tablet TAKE 1 TABLET EVERY DAY 03/16/15  Yes Jerrol Banana., MD  gabapentin (NEURONTIN) 300 MG capsule Take by mouth. 01/12/15  Yes Historical Provider, MD  MULTIPLE VITAMIN PO Take by mouth.   Yes Historical Provider, MD  oxyCODONE-acetaminophen (ROXICET) 5-325 MG tablet Take 1 tablet by mouth every 8 (eight) hours as needed for severe pain. 10/18/15  Yes Richard Maceo Pro., MD  sildenafil (VIAGRA) 100 MG tablet Take by mouth. 11/27/11  Yes Historical Provider, MD  traMADol (ULTRAM) 50 MG tablet Take 50 mg by mouth. Reported on 01/11/2016   Yes Historical Provider, MD  triamterene-hydrochlorothiazide (MAXZIDE-25) 37.5-25 MG per tablet TAKE 1 TABLET EVERY DAY 03/16/15  Yes Jerrol Banana., MD  warfarin (COUMADIN) 1 MG tablet Take by mouth. 08/10/13  Yes Historical Provider, MD  warfarin (COUMADIN)  4 MG tablet Take 1 tablet (4 mg total) by mouth one time only at 6 PM. 07/25/15  Yes Jerrol Banana., MD  warfarin (COUMADIN) 5 MG tablet TAKE 1 TABLET EVERY DAY 03/14/15  Yes Jerrol Banana., MD    Allergies as of 01/02/2016 - Review Complete 10/18/2015  Allergen Reaction Noted  . Doxycycline  02/28/2015    Family History  Problem Relation Age of Onset  . Cancer Mother     secondary to cancinoma of the jaw from her dipping snuff.  . Heart attack Father   . CAD Father   . Hypertension Sister   . Diabetes Son     Type I diabetes    Social History   Social History  . Marital Status: Married    Spouse Name: N/A  . Number of Children: N/A  . Years of Education: N/A   Occupational History  . Not on file.   Social History Main Topics  . Smoking status: Former Smoker    Quit date: 08/26/1971  . Smokeless tobacco: Not on file     Comment: Smoked for about 20 years.  . Alcohol Use: Yes     Comment: Occasional  . Drug Use: Not on file  . Sexual Activity: Not on file   Other Topics Concern  . Not on file  Social History Narrative    Review of Systems: See HPI, otherwise negative ROS  Physical Exam: BP 107/70 mmHg  Pulse 75  Temp(Src) 97.9 F (36.6 C) (Tympanic)  Resp 16  Ht '5\' 9"'$  (1.753 m)  Wt 90.719 kg (200 lb)  BMI 29.52 kg/m2  SpO2 99% General:   Alert,  pleasant and cooperative in NAD Head:  Normocephalic and atraumatic. Neck:  Supple; no masses or thyromegaly. Lungs:  Clear throughout to auscultation.    Heart:  Regular rate and rhythm. Abdomen:  Soft, nontender and nondistended. Normal bowel sounds, without guarding, and without rebound.   Neurologic:  Alert and  oriented x4;  grossly normal neurologically.  Impression/Plan: PISTOL KESSENICH is here for an colonoscopy to be performed for [facility] colon polyps  Risks, benefits, limitations, and alternatives regarding  colonoscopy have been reviewed with the patient.  Questions have been answered.   All parties agreeable.   Gaylyn Cheers, MD  01/11/2016, 11:54 AM

## 2016-01-15 ENCOUNTER — Encounter: Payer: Self-pay | Admitting: Unknown Physician Specialty

## 2016-01-18 ENCOUNTER — Ambulatory Visit (INDEPENDENT_AMBULATORY_CARE_PROVIDER_SITE_OTHER): Payer: Commercial Managed Care - HMO

## 2016-01-18 DIAGNOSIS — I480 Paroxysmal atrial fibrillation: Secondary | ICD-10-CM

## 2016-01-18 LAB — POCT INR
INR: 1.3
PT: 15.9

## 2016-01-18 NOTE — Patient Instructions (Signed)
Anticoagulation Dose Instructions as of 01/18/2016      Casey Gallegos Tue Wed Thu Fri Sat   New Dose 0 mg 0 mg 0 mg 0 mg 0 mg 0 mg 0 mg    Description        5 mg daily except 4 mg on M-F, f/u in 2 weeks

## 2016-01-31 ENCOUNTER — Ambulatory Visit (INDEPENDENT_AMBULATORY_CARE_PROVIDER_SITE_OTHER): Payer: Commercial Managed Care - HMO | Admitting: Emergency Medicine

## 2016-01-31 DIAGNOSIS — I480 Paroxysmal atrial fibrillation: Secondary | ICD-10-CM | POA: Diagnosis not present

## 2016-01-31 LAB — POCT INR
INR: 2.4
PT: 29.3

## 2016-01-31 NOTE — Patient Instructions (Addendum)
Anticoagulation Dose Instructions as of 01/31/2016      Dorene Grebe Tue Wed Thu Fri Sat   New Dose 5 mg 4 mg 5 mg 5 mg 5 mg 4 mg 5 mg    Description        Continue same dose, f/u in 2 weeks and if is still at target, will go back to 1 month.

## 2016-02-14 ENCOUNTER — Other Ambulatory Visit: Payer: Self-pay

## 2016-02-14 ENCOUNTER — Ambulatory Visit (INDEPENDENT_AMBULATORY_CARE_PROVIDER_SITE_OTHER): Payer: Commercial Managed Care - HMO

## 2016-02-14 DIAGNOSIS — I480 Paroxysmal atrial fibrillation: Secondary | ICD-10-CM

## 2016-02-14 LAB — POCT INR
INR: 3.9
PT: 47.1

## 2016-02-14 MED ORDER — GABAPENTIN 300 MG PO CAPS
300.0000 mg | ORAL_CAPSULE | Freq: Three times a day (TID) | ORAL | Status: DC
Start: 2016-02-14 — End: 2017-03-22

## 2016-02-14 MED ORDER — TRIAMTERENE-HCTZ 37.5-25 MG PO TABS: 1.0000 | ORAL_TABLET | Freq: Every day | ORAL | Status: DC

## 2016-02-14 MED ORDER — WARFARIN SODIUM 4 MG PO TABS: 4.0000 mg | ORAL_TABLET | Freq: Once | ORAL | Status: DC

## 2016-02-14 MED ORDER — DOXAZOSIN MESYLATE 8 MG PO TABS: 8.0000 mg | ORAL_TABLET | Freq: Every day | ORAL | Status: DC

## 2016-02-14 MED ORDER — CARVEDILOL 6.25 MG PO TABS: 6.2500 mg | ORAL_TABLET | Freq: Two times a day (BID) | ORAL | Status: DC

## 2016-02-14 MED ORDER — BENAZEPRIL HCL 40 MG PO TABS: 40.0000 mg | ORAL_TABLET | Freq: Every day | ORAL | Status: DC

## 2016-02-14 MED ORDER — AMLODIPINE BESYLATE 5 MG PO TABS: 5.0000 mg | ORAL_TABLET | Freq: Every day | ORAL | Status: DC

## 2016-02-14 NOTE — Patient Instructions (Signed)
Anticoagulation Dose Instructions as of 02/14/2016      Dorene Grebe Tue Wed Thu Fri Sat   New Dose 5 mg 5 mg 5 mg 4 mg 5 mg 5 mg 4 mg    Description        Skip today and tomorrow then take 5 mg daily except 4 mg on Saturdays and Wednesdays. Re check in 1 to 2 weeks.

## 2016-02-15 ENCOUNTER — Ambulatory Visit: Payer: Commercial Managed Care - HMO

## 2016-03-05 ENCOUNTER — Ambulatory Visit (INDEPENDENT_AMBULATORY_CARE_PROVIDER_SITE_OTHER): Payer: Commercial Managed Care - HMO

## 2016-03-05 DIAGNOSIS — I482 Chronic atrial fibrillation, unspecified: Secondary | ICD-10-CM

## 2016-03-05 LAB — POCT INR
INR: 2.1
PT: 24.7

## 2016-03-05 NOTE — Patient Instructions (Signed)
Anticoagulation Dose Instructions as of 03/05/2016      Dorene Grebe Tue Wed Thu Fri Sat   New Dose 5 mg 5 mg 5 mg 4 mg 5 mg 5 mg 4 mg    Description        Take 5 mg daily except 4 mg on Saturdays and Wednesdays. Re check in 3-4 weeks

## 2016-04-04 ENCOUNTER — Ambulatory Visit (INDEPENDENT_AMBULATORY_CARE_PROVIDER_SITE_OTHER): Payer: Commercial Managed Care - HMO

## 2016-04-04 DIAGNOSIS — I482 Chronic atrial fibrillation, unspecified: Secondary | ICD-10-CM

## 2016-04-04 LAB — POCT INR
INR: 2.7
PT: 32.2

## 2016-04-04 NOTE — Patient Instructions (Signed)
Anticoagulation Dose Instructions as of 04/04/2016      Casey Gallegos Tue Wed Thu Fri Sat   New Dose 5 mg 5 mg 5 mg 4 mg 5 mg 5 mg 4 mg    Description   Take 5 mg daily except 4 mg on Saturdays and Wednesdays. Re check in 4 weeks

## 2016-04-11 ENCOUNTER — Ambulatory Visit (INDEPENDENT_AMBULATORY_CARE_PROVIDER_SITE_OTHER): Payer: Commercial Managed Care - HMO | Admitting: Family Medicine

## 2016-04-11 ENCOUNTER — Ambulatory Visit
Admission: RE | Admit: 2016-04-11 | Discharge: 2016-04-11 | Disposition: A | Discharge status: Home / Self Care | Payer: Commercial Managed Care - HMO | Source: Ambulatory Visit | Attending: Family Medicine | Admitting: Family Medicine

## 2016-04-11 VITALS — BP 82/52 | HR 76 | Temp 97.4°F | Resp 18 | Wt 198.0 lb

## 2016-04-11 DIAGNOSIS — J40 Bronchitis, not specified as acute or chronic: Secondary | ICD-10-CM | POA: Diagnosis not present

## 2016-04-11 DIAGNOSIS — R05 Cough: Secondary | ICD-10-CM | POA: Insufficient documentation

## 2016-04-11 DIAGNOSIS — R059 Cough, unspecified: Secondary | ICD-10-CM

## 2016-04-11 DIAGNOSIS — R300 Dysuria: Secondary | ICD-10-CM

## 2016-04-11 LAB — POCT URINALYSIS DIPSTICK
Bilirubin, UA: NEGATIVE
Glucose, UA: NEGATIVE
Ketones, UA: NEGATIVE
LEUKOCYTES UA: NEGATIVE
Nitrite, UA: NEGATIVE
Spec Grav, UA: 1.01
UROBILINOGEN UA: 2
pH, UA: 7.5

## 2016-04-11 MED ORDER — CEFDINIR 300 MG PO CAPS: 600.0000 mg | ORAL_CAPSULE | Freq: Every day | ORAL | 0 refills | Status: DC

## 2016-04-11 NOTE — Progress Notes (Signed)
[name] Male    DOB: 05/12/1935   81 y.o.   MRN: [id number] Visit Date: 04/11/2016  Today's Provider: Lelon Huh, MD   Chief Complaint  Patient presents with  . URI  . Fatigue   Subjective:    URI   This is a new problem. Episode onset: 1 week ago. The problem has been unchanged. Maximum temperature: had fever for 3 days; now resolved. Associated symptoms include abdominal pain, congestion, coughing (productive with yellow phlegm), dysuria, neck pain, rhinorrhea, sneezing and a sore throat (resolved). Pertinent negatives include no chest pain, diarrhea, ear pain, headaches, nausea, vomiting or wheezing. Treatments tried: Nyquil. The treatment provided no relief.       Allergies  Allergen Reactions  . Doxycycline     Sun sensitivity  . Prednisone    Current Meds  Medication Sig  . amLODipine (NORVASC) 5 MG tablet Take 1 tablet (5 mg total) by mouth daily.  . benazepril (LOTENSIN) 40 MG tablet Take 1 tablet (40 mg total) by mouth daily.  . carvedilol (COREG) 6.25 MG tablet Take 1 tablet (6.25 mg total) by mouth 2 (two) times daily.  Marland Kitchen doxazosin (CARDURA) 8 MG tablet Take 1 tablet (8 mg total) by mouth daily.  Marland Kitchen gabapentin (NEURONTIN) 300 MG capsule Take 1 capsule (300 mg total) by mouth 3 (three) times daily.  . MULTIPLE VITAMIN PO Take by mouth.  . oxyCODONE-acetaminophen (ROXICET) 5-325 MG tablet Take 1 tablet by mouth every 8 (eight) hours as needed for severe pain.  . sildenafil (VIAGRA) 100 MG tablet Take by mouth.  . traMADol (ULTRAM) 50 MG tablet Take 50 mg by mouth. Reported on 01/11/2016  . triamterene-hydrochlorothiazide (MAXZIDE-25) 37.5-25 MG tablet Take 1 tablet by mouth daily.  Marland Kitchen warfarin (COUMADIN) 1 MG tablet Take by mouth.  . warfarin (COUMADIN) 4 MG tablet Take 1 tablet (4 mg total) by mouth one time only at 6 PM.  . warfarin (COUMADIN) 5 MG tablet TAKE 1 TABLET EVERY DAY    Review of Systems  Constitutional: Negative for appetite  change, chills and fever.  HENT: Positive for congestion, postnasal drip, rhinorrhea, sneezing and sore throat (resolved). Negative for ear pain.   Respiratory: Positive for cough (productive with yellow phlegm). Negative for chest tightness, shortness of breath and wheezing.   Cardiovascular: Negative for chest pain and palpitations.  Gastrointestinal: Positive for abdominal pain. Negative for diarrhea, nausea and vomiting.  Genitourinary: Positive for dysuria.  Musculoskeletal: Positive for back pain, myalgias and neck pain.  Neurological: Positive for weakness (when standing). Negative for headaches.    Social History  Substance Use Topics  . Smoking status: Former Smoker    Quit date: 08/26/1971  . Smokeless tobacco: Not on file     Comment: Smoked for about 20 years.  . Alcohol use Yes     Comment: Occasional   Objective:   BP (!) 82/52 (BP Location: Right Arm, Patient Position: Sitting, Cuff Size: Large)   Pulse 76   Temp 97.4 F (36.3 C) (Oral)   Resp 18   Wt 198 lb (89.8 kg)   SpO2 97% Comment: room air  BMI 29.24 kg/m   Physical Exam  General Appearance:    Alert, cooperative, no distress  HENT:   ENT exam normal, no neck nodes or sinus tenderness  Eyes:    PERRL, conjunctiva/corneas clear, EOM's intact       Lungs:     Occasional expiratory wheeze, no rales, ,  respirations unlabored  Heart:    Regular rate and rhythm  Neurologic:   Awake, alert, oriented x 3. No apparent focal neurological           defect.      Chest XR: Negiative     Assessment & Plan:     1. Cough  - DG Chest 2 View; Future  2. Dysuria  - POCT Urinalysis Dipstick  3. Bronchitis  - cefdinir (OMNICEF) 300 MG capsule; Take 2 capsules (600 mg total) by mouth daily.  Dispense: 20 capsule; Refill: 0       Lelon Huh, MD  [facility]

## 2016-04-13 ENCOUNTER — Encounter: Payer: Self-pay | Admitting: Emergency Medicine

## 2016-04-13 ENCOUNTER — Emergency Department
Admission: EM | Admit: 2016-04-13 | Discharge: 2016-04-13 | Disposition: A | Discharge status: Home / Self Care | Payer: Commercial Managed Care - HMO | Attending: Emergency Medicine | Admitting: Emergency Medicine

## 2016-04-13 ENCOUNTER — Emergency Department: Payer: Commercial Managed Care - HMO

## 2016-04-13 DIAGNOSIS — Z87891 Personal history of nicotine dependence: Secondary | ICD-10-CM | POA: Diagnosis not present

## 2016-04-13 DIAGNOSIS — Z789 Other specified health status: Secondary | ICD-10-CM

## 2016-04-13 DIAGNOSIS — Y658 Other specified misadventures during surgical and medical care: Secondary | ICD-10-CM | POA: Diagnosis not present

## 2016-04-13 DIAGNOSIS — J40 Bronchitis, not specified as acute or chronic: Secondary | ICD-10-CM | POA: Diagnosis not present

## 2016-04-13 DIAGNOSIS — T887XXA Unspecified adverse effect of drug or medicament, initial encounter: Secondary | ICD-10-CM | POA: Diagnosis not present

## 2016-04-13 DIAGNOSIS — R05 Cough: Secondary | ICD-10-CM | POA: Diagnosis present

## 2016-04-13 DIAGNOSIS — I10 Essential (primary) hypertension: Secondary | ICD-10-CM | POA: Diagnosis not present

## 2016-04-13 DIAGNOSIS — R0602 Shortness of breath: Secondary | ICD-10-CM | POA: Diagnosis not present

## 2016-04-13 DIAGNOSIS — Z79899 Other long term (current) drug therapy: Secondary | ICD-10-CM | POA: Insufficient documentation

## 2016-04-13 DIAGNOSIS — I4891 Unspecified atrial fibrillation: Secondary | ICD-10-CM | POA: Insufficient documentation

## 2016-04-13 DIAGNOSIS — T368X5A Adverse effect of other systemic antibiotics, initial encounter: Secondary | ICD-10-CM | POA: Insufficient documentation

## 2016-04-13 DIAGNOSIS — Z7901 Long term (current) use of anticoagulants: Secondary | ICD-10-CM | POA: Insufficient documentation

## 2016-04-13 HISTORY — DX: Unspecified atrial fibrillation: I48.91

## 2016-04-13 LAB — BASIC METABOLIC PANEL
ANION GAP: 9 (ref 5–15)
BUN: 22 mg/dL — AB (ref 6–20)
CALCIUM: 9 mg/dL (ref 8.9–10.3)
CO2: 25 mmol/L (ref 22–32)
Chloride: 104 mmol/L (ref 101–111)
Creatinine, Ser: 1.12 mg/dL (ref 0.61–1.24)
GFR calc Af Amer: >60 mL/min (ref >60)
Glucose, Bld: 132 mg/dL — ABNORMAL HIGH (ref 65–99)
POTASSIUM: 3.5 mmol/L (ref 3.5–5.1)
SODIUM: 138 mmol/L (ref 135–145)

## 2016-04-13 LAB — CBC
HEMATOCRIT: 41.1 % (ref 40.0–52.0)
HEMOGLOBIN: 14.6 g/dL (ref 13.0–18.0)
MCH: 32.3 pg (ref 26.0–34.0)
MCHC: 35.4 g/dL (ref 32.0–36.0)
MCV: 91.3 fL (ref 80.0–100.0)
Platelets: 173 10*3/uL (ref 150–440)
RBC: 4.5 MIL/uL (ref 4.40–5.90)
RDW: 14 % (ref 11.5–14.5)
WBC: 6.5 10*3/uL (ref 3.8–10.6)

## 2016-04-13 LAB — TROPONIN I

## 2016-04-13 MED ORDER — AZITHROMYCIN 500 MG PO TABS
500.0000 mg | ORAL_TABLET | Freq: Once | ORAL | Status: AC
  Administered 2016-04-13: 500 mg via ORAL
  Filled 2016-04-13: qty 1

## 2016-04-13 MED ORDER — AZITHROMYCIN 250 MG PO TABS: ORAL_TABLET | ORAL | 0 refills | Status: DC

## 2016-04-13 NOTE — Discharge Instructions (Signed)
You were evaluated for coughing up yellow phlegm, and sensory smell changes after starting the antibiotic. Stop taking the previous antibiotic, and now take azithromycin.  Return to the emergency room for any worsening trouble breathing, shortness breath, chest pain, fever, confusion or altered mental status, or any other symptoms concerning to you.

## 2016-04-13 NOTE — ED Provider Notes (Signed)
[facility] Emergency Department Provider Note ____________________________________________   I have reviewed the triage vital signs and the triage nursing note.  HISTORY  Chief Complaint Shortness of Breath   Historian Patient and spouse  HPI Casey Gallegos is a 81 y.o. male who reports that he has had upper respiratory symptoms for about 2 weeks now. His wife got better from the upper respiratory symptoms about a week ago and because he is not improving he saw his primary care physician's office on Friday. Dr. Rosanna Randy was not available, and so he saw Dr. Caryn Section. He had a chest x-ray which she was told was normal and he was diagnosed with bronchitis and placed on cefdinir  antibiotic. He took 2 pills on Friday, 2 pills on Saturday, and 2 pills this morning. Since yesterday he is reporting extreme changes in his sense of smell. He is having conversion to food, and really everything. He thinks it's due to the antibiotic.  No fevers, but persistent cough productive of yellow sputum. No confusion altered mental status. Spouse states that he has an issue ongoing with occasionally low blood pressure when he stands up. No coughing up blood. No pleuritic chest pain. No wheezing or paroxysms of cough.    Past Medical History:  Diagnosis Date  . A-fib ([place])   . Anemia   . DVT (deep venous thrombosis) (HCC)    Monitor with PT/INR. Target 2.0 - 3.0   . H/O degenerative disc disease   . Hypertension   . Lipomatosis   . Night sweats   . Obesity     Patient Active Problem List   Diagnosis Date Noted  . A-fib ([place]) 03/02/2015  . Deep vein thrombosis ([place]) 03/02/2015  . Essential (primary) hypertension 03/02/2015  . Combined fat and carbohydrate induced hyperlipemia 03/02/2015  . Heart valve disease 03/02/2015  . Allergic rhinitis 02/28/2015  . Absolute anemia 02/28/2015  . 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 Back pain, chronic 02/28/2015  . Sex counseling  02/28/2015  . Narrowing of intervertebral disc space 02/28/2015  . Acute thromboembolism of deep veins of lower extremity ([place]) 02/28/2015  . ED (erectile dysfunction) of organic origin 02/28/2015  . Abnormal prostate specific antigen 02/28/2015  . BP (high blood pressure) 02/28/2015  . Lipomatosis 02/28/2015  . Malaise and fatigue 02/28/2015  . Generalized hyperhidrosis 02/28/2015  . Arthritis, degenerative 02/28/2015  . Adiposity 02/28/2015  . AF (paroxysmal atrial fibrillation) ([place]) 02/28/2015  . Chronic prostatitis 02/28/2015  . Spinal stenosis 02/28/2015  . Atypical pneumonia 02/28/2015    Past Surgical History:  Procedure Laterality Date  . Bilateral Radical Keratotomy Bilateral 1997  . COLONOSCOPY WITH PROPOFOL N/A 01/11/2016   Procedure: COLONOSCOPY WITH PROPOFOL;  Surgeon: Manya Silvas, MD;  Location: [facility] ENDOSCOPY;  Service: Endoscopy;  Laterality: N/A; repeat 3 years, tubular adenoma  . deep vein thrombosis    . DENTAL SURGERY     Patient had implants  . GUM SURGERY    . L4 - S1 decompression Left    Left-sided  . TONSILLECTOMY AND ADENOIDECTOMY  age 79    Prior to Admission medications   Medication Sig Start Date End Date Taking? Authorizing Provider  amLODipine (NORVASC) 5 MG tablet Take 1 tablet (5 mg total) by mouth daily. 02/14/16   Jerrol Banana., MD  azithromycin (ZITHROMAX) 250 MG tablet '250mg'$  daily for 4 days 04/13/16   Lisa Roca, MD  benazepril (LOTENSIN) 40 MG tablet Take 1 tablet (40 mg total)  by mouth daily. 02/14/16   Richard Maceo Pro., MD  carvedilol (COREG) 6.25 MG tablet Take 1 tablet (6.25 mg total) by mouth 2 (two) times daily. 02/14/16   Richard Maceo Pro., MD  cefdinir (OMNICEF) 300 MG capsule Take 2 capsules (600 mg total) by mouth daily. 04/11/16 04/21/16  Birdie Sons, MD  doxazosin (CARDURA) 8 MG tablet Take 1 tablet (8 mg total) by mouth daily. 02/14/16   Richard Maceo Pro., MD  gabapentin (NEURONTIN) 300 MG capsule Take  1 capsule (300 mg total) by mouth 3 (three) times daily. 02/14/16   Richard Maceo Pro., MD  MULTIPLE VITAMIN PO Take by mouth.    Historical Provider, MD  oxyCODONE-acetaminophen (ROXICET) 5-325 MG tablet Take 1 tablet by mouth every 8 (eight) hours as needed for severe pain. 10/18/15   Jerrol Banana., MD  sildenafil (VIAGRA) 100 MG tablet Take by mouth. 11/27/11   Historical Provider, MD  traMADol (ULTRAM) 50 MG tablet Take 50 mg by mouth. Reported on 01/11/2016    Historical Provider, MD  triamterene-hydrochlorothiazide (MAXZIDE-25) 37.5-25 MG tablet Take 1 tablet by mouth daily. 02/14/16   Richard Maceo Pro., MD  warfarin (COUMADIN) 1 MG tablet Take by mouth. 08/10/13   Historical Provider, MD  warfarin (COUMADIN) 4 MG tablet Take 1 tablet (4 mg total) by mouth one time only at 6 PM. 02/14/16   Jerrol Banana., MD  warfarin (COUMADIN) 5 MG tablet TAKE 1 TABLET EVERY DAY 03/14/15   Jerrol Banana., MD    Allergies  Allergen Reactions  . Doxycycline     Sun sensitivity  . Prednisone     Family History  Problem Relation Age of Onset  . Cancer Mother     secondary to cancinoma of the jaw from her dipping snuff.  . Heart attack Father   . CAD Father   . Hypertension Sister   . Diabetes Son     Type I diabetes    Social History Social History  Substance Use Topics  . Smoking status: Former Smoker    Quit date: 08/26/1971  . Smokeless tobacco: Never Used     Comment: Smoked for about 20 years.  . Alcohol use Yes     Comment: Occasional    Review of Systems  Constitutional: Negative for feverThis week. Eyes: Negative for visual changes. ENT: Negative for sore throat. Perception of smell is hypersensitive. Cardiovascular: Negative for chest pain. Respiratory: Negative for shortness of breath. Positive for intermittent cough. Gastrointestinal: Negative for abdominal pain, vomiting and diarrhea. Genitourinary: Negative for dysuria. Musculoskeletal: Negative for  back pain. Skin: Negative for rash. Neurological: Negative for headache. 10 point Review of Systems otherwise negative ____________________________________________   PHYSICAL EXAM:  VITAL SIGNS: ED Triage Vitals  Enc Vitals Group     BP 04/13/16 0813 121/79     Pulse Rate 04/13/16 0813 81     Resp 04/13/16 0813 20     Temp 04/13/16 0813 97.7 F (36.5 C)     Temp Source 04/13/16 0813 Oral     SpO2 04/13/16 0813 100 %     Weight 04/13/16 0815 198 lb (89.8 kg)     Height 04/13/16 0815 '5\' 9"'$  (1.753 m)     Head Circumference --      Peak Flow --      Pain Score 04/13/16 0815 0     Pain Loc --      Pain Edu? --  Excl. in [place]? --      Constitutional: Alert and oriented. Well appearing and in no distress. HEENT   Head: Normocephalic and atraumatic.      Eyes: Conjunctivae are normal. PERRL. Normal extraocular movements.      Ears:         Nose: No congestion/rhinnorhea.   Mouth/Throat: Mucous membranes are moist.   Neck: No stridor. Cardiovascular/Chest: Normal rate, regular rhythm.  No murmurs, rubs, or gallops. Respiratory: Normal respiratory effort without tachypnea nor retractions. Breath sounds are clear and equal bilaterally. No wheezes/rales/rhonchi.  Able to take a big deep breath without any coughing or wheezing. Gastrointestinal: Soft. No distention, no guarding, no rebound. Nontender.    Genitourinary/rectal:Deferred Musculoskeletal: Nontender with normal range of motion in all extremities. No joint effusions.  No lower extremity tenderness.  No edema. Neurologic:  Normal speech and language. No gross or focal neurologic deficits are appreciated. Skin:  Skin is warm, dry and intact. No rash noted. Psychiatric: Somewhat anxious and upset, but overall speech and behavior are normal. Patient exhibits appropriate insight and judgment.  ____________________________________________   EKG I, Lisa Roca, MD, the attending physician have personally viewed and  interpreted all ECGs.  90 bpm. Right bundle branch block. Normal axis. Nonspecific ST and T-wave ____________________________________________  LABS (pertinent positives/negatives)  Labs Reviewed  BASIC METABOLIC PANEL - Abnormal; Notable for the following:       Result Value   Glucose, Bld 132 (*)    BUN 22 (*)    All other components within normal limits  CBC  TROPONIN I  URINALYSIS COMPLETEWITH MICROSCOPIC ([facility] ONLY)    ____________________________________________  RADIOLOGY All Xrays were viewed by me. Imaging interpreted by Radiologist.  Chest x-ray two-view: No edema or consolidation. __________________________________________  PROCEDURES  Procedure(s) performed: None  Critical Care performed: None  ____________________________________________   ED COURSE / ASSESSMENT AND PLAN  Pertinent labs & imaging results that were available during my care of the patient were reviewed by me and considered in my medical decision making (see chart for details).   Mr. Gains is here complaining of side effect due to the antibiotic he started on Friday. He states he is unable to take it anymore because of this changed to sense of smell.  In terms of the indication for an antibiotic, I do think he is having upper respiratory infection/bronchitis. He is productive of yellow sputum, and although his laboratory studies and x-ray are clear, I will change his antibiotic to azithromycin.  No wheezing and I don't think that an albuterol inhaler would be helpful this point in time. He is not presenting with any worsening in terms of no hypoxia or altered mental status for something such as sepsis.   Patient will make a follow-up appointment with Dr. Alben Spittle office this week.    CONSULTATIONS:   None   Patient / Family / Caregiver informed of clinical course, medical decision-making process, and agree with plan.   I discussed return precautions, follow-up instructions, and  discharged instructions with patient and/or family.   ___________________________________________   FINAL CLINICAL IMPRESSION(S) / ED DIAGNOSES   Final diagnoses:  Antibiotic drug intolerance  Bronchitis              Note: This dictation was prepared with Dragon dictation. Any transcriptional errors that result from this process are unintentional    Lisa Roca, MD 04/13/16 1313

## 2016-04-13 NOTE — ED Triage Notes (Signed)
Seen by Dr. Alm Bustard and diagnosed with bronchitis last Friday.  Patient has been feeling sick for about one week.  Started on Cefdinir, but patient not improved.  Patient c/o feeling ill, sensitive to smells, feels like something is "hung up" in esophagus and intermittent SOB.

## 2016-04-15 ENCOUNTER — Ambulatory Visit (INDEPENDENT_AMBULATORY_CARE_PROVIDER_SITE_OTHER): Payer: Commercial Managed Care - HMO | Admitting: Family Medicine

## 2016-04-15 VITALS — BP 108/76 | HR 82 | Temp 97.6°F | Resp 18 | Wt 194.0 lb

## 2016-04-15 DIAGNOSIS — F418 Other specified anxiety disorders: Secondary | ICD-10-CM | POA: Diagnosis not present

## 2016-04-15 DIAGNOSIS — I482 Chronic atrial fibrillation, unspecified: Secondary | ICD-10-CM

## 2016-04-15 DIAGNOSIS — J40 Bronchitis, not specified as acute or chronic: Secondary | ICD-10-CM

## 2016-04-15 DIAGNOSIS — M5136 Other intervertebral disc degeneration, lumbar region: Secondary | ICD-10-CM

## 2016-04-15 DIAGNOSIS — F32A Depression, unspecified: Secondary | ICD-10-CM

## 2016-04-15 DIAGNOSIS — R5383 Other fatigue: Secondary | ICD-10-CM

## 2016-04-15 DIAGNOSIS — N410 Acute prostatitis: Secondary | ICD-10-CM

## 2016-04-15 DIAGNOSIS — R431 Parosmia: Secondary | ICD-10-CM | POA: Diagnosis not present

## 2016-04-15 DIAGNOSIS — N4889 Other specified disorders of penis: Secondary | ICD-10-CM | POA: Diagnosis not present

## 2016-04-15 DIAGNOSIS — F329 Major depressive disorder, single episode, unspecified: Secondary | ICD-10-CM

## 2016-04-15 DIAGNOSIS — F419 Anxiety disorder, unspecified: Secondary | ICD-10-CM

## 2016-04-15 DIAGNOSIS — R439 Unspecified disturbances of smell and taste: Secondary | ICD-10-CM

## 2016-04-15 LAB — POCT URINALYSIS DIPSTICK
BILIRUBIN UA: NEGATIVE
GLUCOSE UA: NEGATIVE
Ketones, UA: NEGATIVE
LEUKOCYTES UA: NEGATIVE
NITRITE UA: NEGATIVE
Protein, UA: NEGATIVE
RBC UA: NEGATIVE
Spec Grav, UA: 1.02
UROBILINOGEN UA: 0.2
pH, UA: 7

## 2016-04-15 MED ORDER — SULFAMETHOXAZOLE-TRIMETHOPRIM 800-160 MG PO TABS: 1.0000 | ORAL_TABLET | Freq: Two times a day (BID) | ORAL | 0 refills | Status: DC

## 2016-04-15 MED ORDER — SERTRALINE HCL 50 MG PO TABS: 50.0000 mg | ORAL_TABLET | Freq: Every day | ORAL | 5 refills | Status: DC

## 2016-04-15 MED ORDER — DOXYCYCLINE MONOHYDRATE 100 MG PO TABS
100.0000 mg | ORAL_TABLET | Freq: Two times a day (BID) | ORAL | 0 refills | Status: DC
Start: 2016-04-15 — End: 2016-04-29

## 2016-04-15 MED ORDER — ALPRAZOLAM 0.25 MG PO TABS: 0.2500 mg | ORAL_TABLET | Freq: Four times a day (QID) | ORAL | 2 refills | Status: DC | PRN

## 2016-04-15 NOTE — Progress Notes (Signed)
Subjective:  HPI  Patient is here to follow up after ER visit and other concerns. Patient was seen by Dr Caryn Section on the 18th and was diagnosed with bronchitis and treated with Cefdnir. After 3 doses he noticed his smell of things has become more intense and really bothered him. Wife states he developed anxiety and panic feeling and had trouble breathing because of that. Patient went to ER on 8/20 at that time, chest xray was repeated and it was clear, EKG was done his O2 was normal. His antibiotic was switched to [place].  Patient states that the smell sensation is better but is still there and bothers him. He has also had severe back pain and penile pain. Urine was not checked at ER. He feels bad and does not understand why. Wife states he has felt weak and has not gardened in 6 days which is unusual for him. He also feels like he has "something hung in his throat."  MMSE - Mini Mental State Exam 04/15/2016  Orientation to time 4  Orientation to Place 5  Registration 3  Attention/ Calculation 5  Recall 1  Language- name 2 objects 2  Language- repeat 1  Language- follow 3 step command 3  Language- read & follow direction 1  Write a sentence 1  Copy design 1  Total score 27   Depression screen [facility] 2/9 04/15/2016 10/18/2015  Decreased Interest 1 0  Down, Depressed, Hopeless 0 0  PHQ - 2 Score 1 0  Altered sleeping 0 -  Tired, decreased energy 1 -  Change in appetite 1 -  Feeling bad or failure about yourself  0 -  Trouble concentrating 0 -  Moving slowly or fidgety/restless 1 -  Suicidal thoughts 0 -  PHQ-9 Score 4 -  Difficult doing work/chores Not difficult at all -     Prior to Admission medications   Medication Sig Start Date End Date Taking? Authorizing Provider  amLODipine (NORVASC) 5 MG tablet Take 1 tablet (5 mg total) by mouth daily. 02/14/16  Yes Richard Maceo Pro., MD  azithromycin [facility]) 250 MG tablet '250mg'$  daily for 4 days 04/13/16  Yes Lisa Roca, MD    benazepril (LOTENSIN) 40 MG tablet Take 1 tablet (40 mg total) by mouth daily. 02/14/16  Yes Richard Maceo Pro., MD  carvedilol (COREG) 6.25 MG tablet Take 1 tablet (6.25 mg total) by mouth 2 (two) times daily. 02/14/16  Yes Richard Maceo Pro., MD  doxazosin (CARDURA) 8 MG tablet Take 1 tablet (8 mg total) by mouth daily. 02/14/16  Yes Richard Maceo Pro., MD  gabapentin (NEURONTIN) 300 MG capsule Take 1 capsule (300 mg total) by mouth 3 (three) times daily. 02/14/16  Yes Richard Maceo Pro., MD  MULTIPLE VITAMIN PO Take by mouth.   Yes Historical Provider, MD  oxyCODONE-acetaminophen (ROXICET) 5-325 MG tablet Take 1 tablet by mouth every 8 (eight) hours as needed for severe pain. 10/18/15  Yes Richard Maceo Pro., MD  sildenafil (VIAGRA) 100 MG tablet Take by mouth. 11/27/11  Yes Historical Provider, MD  traMADol (ULTRAM) 50 MG tablet Take 50 mg by mouth. Reported on 01/11/2016   Yes Historical Provider, MD  triamterene-hydrochlorothiazide (MAXZIDE-25) 37.5-25 MG tablet Take 1 tablet by mouth daily. 02/14/16  Yes Richard Maceo Pro., MD  warfarin (COUMADIN) 1 MG tablet Take by mouth. 08/10/13  Yes Historical Provider, MD  warfarin (COUMADIN) 4 MG tablet Take 1 tablet (4 mg total) by mouth  one time only at 6 PM. 02/14/16  Yes Jerrol Banana., MD  warfarin (COUMADIN) 5 MG tablet TAKE 1 TABLET EVERY DAY 03/14/15  Yes Jerrol Banana., MD    Patient Active Problem List   Diagnosis Date Noted  . A-fib ([place]) 03/02/2015  . Deep vein thrombosis ([place]) 03/02/2015  . Essential (primary) hypertension 03/02/2015  . Combined fat and carbohydrate induced hyperlipemia 03/02/2015  . Heart valve disease 03/02/2015  . Allergic rhinitis 02/28/2015  . Absolute anemia 02/28/2015  . 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 Back pain, chronic 02/28/2015  . Sex counseling 02/28/2015  . Narrowing of intervertebral disc space 02/28/2015  . Acute thromboembolism of deep veins of lower extremity ([place])  02/28/2015  . ED (erectile dysfunction) of organic origin 02/28/2015  . Abnormal prostate specific antigen 02/28/2015  . BP (high blood pressure) 02/28/2015  . Lipomatosis 02/28/2015  . Malaise and fatigue 02/28/2015  . Generalized hyperhidrosis 02/28/2015  . Arthritis, degenerative 02/28/2015  . Adiposity 02/28/2015  . AF (paroxysmal atrial fibrillation) ([place]) 02/28/2015  . Chronic prostatitis 02/28/2015  . Spinal stenosis 02/28/2015  . Atypical pneumonia 02/28/2015    Past Medical History:  Diagnosis Date  . A-fib ([place])   . Anemia   . DVT (deep venous thrombosis) (HCC)    Monitor with PT/INR. Target 2.0 - 3.0   . H/O degenerative disc disease   . Hypertension   . Lipomatosis   . Night sweats   . Obesity     Social History   Social History  . Marital status: Married    Spouse name: N/A  . Number of children: N/A  . Years of education: N/A   Occupational History  . Not on file.   Social History Main Topics  . Smoking status: Former Smoker    Quit date: 08/26/1971  . Smokeless tobacco: Never Used     Comment: Smoked for about 20 years.  . Alcohol use Yes     Comment: Occasional  . Drug use: Unknown  . Sexual activity: Not on file   Other Topics Concern  . Not on file   Social History Narrative  . No narrative on file    Allergies  Allergen Reactions  . Doxycycline     Sun sensitivity  . Prednisone     Review of Systems  Constitutional: Positive for malaise/fatigue.  HENT:       Increased smell sensation  Eyes: Negative.   Respiratory: Negative.        Feels like something is stuck in the throat/chest.  Cardiovascular: Negative.   Gastrointestinal: Negative.   Genitourinary: Positive for dysuria.  Musculoskeletal: Positive for back pain and joint pain.  Skin: Negative.   Neurological: Positive for weakness.  Endo/Heme/Allergies: Negative.   Psychiatric/Behavioral: The patient is nervous/anxious.     Immunization History  Administered  Date(s) Administered  . Influenza, High Dose Seasonal PF 05/11/2015  . Pneumococcal Conjugate-13 01/09/2014  . Pneumococcal Polysaccharide-23 08/08/2004  . Td 10/25/2008   Objective:  BP 108/76   Pulse 82   Temp 97.6 F (36.4 C)   Resp 18   Wt 194 lb (88 kg)   SpO2 98%   BMI 28.65 kg/m   Physical Exam  Constitutional: He is oriented to person, place, and time and well-developed, well-nourished, and in no distress.  HENT:  Head: Normocephalic and atraumatic.  Right Ear: External ear normal.  Left Ear: External ear normal.  Nose: Nose normal.  Eyes: Conjunctivae are normal.  Pupils are equal, round, and reactive to light.  Neck: Normal range of motion. Neck supple.  Cardiovascular: Normal rate, regular rhythm, normal heart sounds and intact distal pulses.   No murmur heard. Pulmonary/Chest: Effort normal and breath sounds normal. No respiratory distress. He has no wheezes.  Abdominal: He exhibits no distension. There is no tenderness. There is no rebound.  Neurological: He is alert and oriented to person, place, and time.  Skin: Skin is warm and dry.  Psychiatric: Affect normal.    Lab Results  Component Value Date   WBC 6.5 04/13/2016   HGB 14.6 04/13/2016   HCT 41.1 04/13/2016   PLT 173 04/13/2016   GLUCOSE 132 (H) 04/13/2016   CHOL 178 10/18/2015   TRIG 72 10/18/2015   HDL 67 10/18/2015   LDLCALC 97 10/18/2015   TSH 1.830 10/18/2015   INR 2.7 04/04/2016    CMP     Component Value Date/Time   NA 138 04/13/2016 0819   NA 145 (H) 10/18/2015 1111   K 3.5 04/13/2016 0819   CL 104 04/13/2016 0819   CO2 25 04/13/2016 0819   GLUCOSE 132 (H) 04/13/2016 0819   BUN 22 (H) 04/13/2016 0819   BUN 23 10/18/2015 1111   CREATININE 1.12 04/13/2016 0819   CALCIUM 9.0 04/13/2016 0819   PROT 6.5 10/18/2015 1111   ALBUMIN 4.1 10/18/2015 1111   AST 22 10/18/2015 1111   ALT 20 10/18/2015 1111   ALKPHOS 67 10/18/2015 1111   BILITOT 0.8 10/18/2015 1111   GFRNONAA >60  04/13/2016 0819   GFRAA >60 04/13/2016 0819    Assessment and Plan :  1. Chronic atrial fibrillation (HCC) PT/INR 1 week. Follow up visit in 2 weeks.  2. Smell disturbance Discussed options for this. Offered referral to ENT if symptoms not better. This could becoming from depression and/or prostatitis issue. Patient feels anxious about this, will go ahead and refer to ENT.  3. Other fatigue UA is normal. Multifactorial probably. - POCT urinalysis dipstick  4. Penile pain Will treat as possible prostatitis. - POCT urinalysis dipstick  5. Acute prostatitis/chronic Will treat as possible infection. Follow.  6. Bronchitis The "choking sensation" present is probably due to bronchitis. Will follow. May need referral. Patient can use Robitussin.  7.MCI/possible early dementia Could be multifactorial at this time. MMSE 27/30 today. Follow.  8.DDD Discussed the use of Oxycodone can be contributing to the memory issues he is having and may need to switch to [place]. Will follow.  9.HTN-stop Maxzide. Re check on the next visit.  Patient was seen and examined by Dr. Eulas Post and note was scribed by Theressa Millard, RMA. I have done the exam and reviewed the above chart and it is accurate to the best of my knowledge.    Miguel Aschoff MD [place] 04/15/2016 2:33 PM

## 2016-04-15 NOTE — Patient Instructions (Addendum)
Can use Robitussin over the counter. Stop Maxzide. Start Doxycycline on Saturday 04/19/16.

## 2016-04-22 ENCOUNTER — Other Ambulatory Visit: Payer: Self-pay | Admitting: Family Medicine

## 2016-04-23 ENCOUNTER — Ambulatory Visit (INDEPENDENT_AMBULATORY_CARE_PROVIDER_SITE_OTHER): Payer: Commercial Managed Care - HMO

## 2016-04-23 DIAGNOSIS — I482 Chronic atrial fibrillation, unspecified: Secondary | ICD-10-CM

## 2016-04-23 LAB — POCT INR: INR: 3.8

## 2016-04-23 NOTE — Progress Notes (Signed)
Take 4 mg daily. Return in 1 week

## 2016-04-29 ENCOUNTER — Encounter: Payer: Self-pay | Admitting: Family Medicine

## 2016-04-29 ENCOUNTER — Ambulatory Visit (INDEPENDENT_AMBULATORY_CARE_PROVIDER_SITE_OTHER): Payer: Commercial Managed Care - HMO | Admitting: Family Medicine

## 2016-04-29 VITALS — BP 138/74 | HR 64 | Temp 97.8°F | Resp 16 | Wt 214.0 lb

## 2016-04-29 DIAGNOSIS — R300 Dysuria: Secondary | ICD-10-CM | POA: Diagnosis not present

## 2016-04-29 DIAGNOSIS — F419 Anxiety disorder, unspecified: Secondary | ICD-10-CM

## 2016-04-29 DIAGNOSIS — J40 Bronchitis, not specified as acute or chronic: Secondary | ICD-10-CM

## 2016-04-29 DIAGNOSIS — F329 Major depressive disorder, single episode, unspecified: Secondary | ICD-10-CM

## 2016-04-29 DIAGNOSIS — F418 Other specified anxiety disorders: Secondary | ICD-10-CM

## 2016-04-29 DIAGNOSIS — R431 Parosmia: Secondary | ICD-10-CM

## 2016-04-29 DIAGNOSIS — I482 Chronic atrial fibrillation, unspecified: Secondary | ICD-10-CM

## 2016-04-29 DIAGNOSIS — N410 Acute prostatitis: Secondary | ICD-10-CM

## 2016-04-29 DIAGNOSIS — R439 Unspecified disturbances of smell and taste: Secondary | ICD-10-CM

## 2016-04-29 LAB — POCT INR: INR: 2.9

## 2016-04-29 NOTE — Progress Notes (Signed)
Subjective:  HPI Pt is here for a 2 week follow up for prostatitis, bronchitis and hypertension. He reports that he is feeling much better today. The "choking" sensation in his throat and his sensitivity to smell has gone away. He finished his Doxycycline yesterday. Wife canceled his appointments for ENT yesterday as well.    INR was 2.9 today. He is taking 4 mg daily.   Prior to Admission medications   Medication Sig Start Date End Date Taking? Authorizing Provider  ALPRAZolam (XANAX) 0.25 MG tablet Take 1 tablet (0.25 mg total) by mouth every 6 (six) hours as needed for anxiety. 04/15/16   Robben Jagiello Maceo Pro., MD  amLODipine (NORVASC) 5 MG tablet Take 1 tablet (5 mg total) by mouth daily. 02/14/16   Jace Dowe Maceo Pro., MD  benazepril (LOTENSIN) 40 MG tablet Take 1 tablet (40 mg total) by mouth daily. 02/14/16   Antwaun Buth Maceo Pro., MD  carvedilol (COREG) 6.25 MG tablet Take 1 tablet (6.25 mg total) by mouth 2 (two) times daily. 02/14/16   Perl Folmar Maceo Pro., MD  doxazosin (CARDURA) 8 MG tablet Take 1 tablet (8 mg total) by mouth daily. 02/14/16   Jaquanda Wickersham Maceo Pro., MD  doxycycline (ADOXA) 100 MG tablet Take 1 tablet (100 mg total) by mouth 2 (two) times daily. 04/15/16   Maddux First Maceo Pro., MD  gabapentin (NEURONTIN) 300 MG capsule Take 1 capsule (300 mg total) by mouth 3 (three) times daily. 02/14/16   Daily Doe Maceo Pro., MD  MULTIPLE VITAMIN PO Take by mouth.    Historical Provider, MD  oxyCODONE-acetaminophen (ROXICET) 5-325 MG tablet Take 1 tablet by mouth every 8 (eight) hours as needed for severe pain. 10/18/15   Armie Moren Maceo Pro., MD  sertraline (ZOLOFT) 50 MG tablet Take 1 tablet (50 mg total) by mouth daily. 04/15/16   Jerrol Banana., MD  sildenafil (VIAGRA) 100 MG tablet Take by mouth. 11/27/11   Historical Provider, MD  traMADol (ULTRAM) 50 MG tablet Take 50 mg by mouth. Reported on 01/11/2016    Historical Provider, MD  warfarin (COUMADIN) 1 MG tablet Take  by mouth. 08/10/13   Historical Provider, MD  warfarin (COUMADIN) 4 MG tablet Take 1 tablet (4 mg total) by mouth one time only at 6 PM. 02/14/16   Jerrol Banana., MD  warfarin (COUMADIN) 5 MG tablet TAKE 1 TABLET EVERY DAY 04/22/16   Jerrol Banana., MD    Patient Active Problem List   Diagnosis Date Noted  . A-fib ([place]) 03/02/2015  . Deep vein thrombosis ([place]) 03/02/2015  . Essential (primary) hypertension 03/02/2015  . Combined fat and carbohydrate induced hyperlipemia 03/02/2015  . Heart valve disease 03/02/2015  . Allergic rhinitis 02/28/2015  . Absolute anemia 02/28/2015  . 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 Back pain, chronic 02/28/2015  . Sex counseling 02/28/2015  . Narrowing of intervertebral disc space 02/28/2015  . Acute thromboembolism of deep veins of lower extremity ([place]) 02/28/2015  . ED (erectile dysfunction) of organic origin 02/28/2015  . Abnormal prostate specific antigen 02/28/2015  . BP (high blood pressure) 02/28/2015  . Lipomatosis 02/28/2015  . Malaise and fatigue 02/28/2015  . Generalized hyperhidrosis 02/28/2015  . Arthritis, degenerative 02/28/2015  . Adiposity 02/28/2015  . AF (paroxysmal atrial fibrillation) ([place]) 02/28/2015  . Chronic prostatitis 02/28/2015  . Spinal stenosis 02/28/2015  . Atypical pneumonia 02/28/2015    Past Medical History:  Diagnosis Date  . A-fib (  HCC)   . Anemia   . DVT (deep venous thrombosis) (HCC)    Monitor with PT/INR. Target 2.0 - 3.0   . H/O degenerative disc disease   . Hypertension   . Lipomatosis   . Night sweats   . Obesity     Social History   Social History  . Marital status: Married    Spouse name: N/A  . Number of children: N/A  . Years of education: N/A   Occupational History  . Not on file.   Social History Main Topics  . Smoking status: Former Smoker    Quit date: 08/26/1971  . Smokeless tobacco: Never Used     Comment: Smoked for about 20 years.  . Alcohol use Yes      Comment: Occasional  . Drug use: Unknown  . Sexual activity: Not on file   Other Topics Concern  . Not on file   Social History Narrative  . No narrative on file    Allergies  Allergen Reactions  . Cefdinir Nausea Only    hypersensitivity to smell  . Doxycycline     Sun sensitivity  . Prednisone   . Sertraline Other (See Comments)    Hallucinations     Review of Systems  Constitutional: Negative.   HENT: Negative.   Eyes: Negative.   Respiratory: Negative.   Cardiovascular: Negative.   Gastrointestinal: Negative.   Genitourinary: Negative.   Musculoskeletal: Negative.   Skin: Negative.   Neurological: Negative.   Endo/Heme/Allergies: Negative.   Psychiatric/Behavioral: Negative.     Immunization History  Administered Date(s) Administered  . Influenza, High Dose Seasonal PF 05/11/2015  . Pneumococcal Conjugate-13 01/09/2014  . Pneumococcal Polysaccharide-23 08/08/2004  . Td 10/25/2008   Objective:  BP 138/74 (BP Location: Left Arm, Patient Position: Sitting, Cuff Size: Normal)   Pulse 64   Temp 97.8 F (36.6 C) (Oral)   Resp 16   Wt 214 lb (97.1 kg)   BMI 31.60 kg/m   Physical Exam  Constitutional: He is oriented to person, place, and time and well-developed, well-nourished, and in no distress.  HENT:  Head: Normocephalic and atraumatic.  Right Ear: External ear normal.  Left Ear: External ear normal.  Nose: Nose normal.  Eyes: Conjunctivae and EOM are normal. Pupils are equal, round, and reactive to light.  Neck: Normal range of motion. Neck supple.  Cardiovascular: Normal rate, regular rhythm, normal heart sounds and intact distal pulses.   Pulmonary/Chest: Effort normal and breath sounds normal.  Abdominal: Soft.  Musculoskeletal: Normal range of motion. He exhibits edema (trace).  Neurological: He is alert and oriented to person, place, and time. He has normal reflexes. Gait normal. GCS score is 15.  Skin: Skin is warm and dry.  Psychiatric:  Mood, memory, affect and judgment normal.    Lab Results  Component Value Date   WBC 6.5 04/13/2016   HGB 14.6 04/13/2016   HCT 41.1 04/13/2016   PLT 173 04/13/2016   GLUCOSE 132 (H) 04/13/2016   CHOL 178 10/18/2015   TRIG 72 10/18/2015   HDL 67 10/18/2015   LDLCALC 97 10/18/2015   TSH 1.830 10/18/2015   INR 3.8 04/23/2016    CMP     Component Value Date/Time   NA 138 04/13/2016 0819   NA 145 (H) 10/18/2015 1111   K 3.5 04/13/2016 0819   CL 104 04/13/2016 0819   CO2 25 04/13/2016 0819   GLUCOSE 132 (H) 04/13/2016 0819   BUN 22 (H) 04/13/2016 2595  BUN 23 10/18/2015 1111   CREATININE 1.12 04/13/2016 0819   CALCIUM 9.0 04/13/2016 0819   PROT 6.5 10/18/2015 1111   ALBUMIN 4.1 10/18/2015 1111   AST 22 10/18/2015 1111   ALT 20 10/18/2015 1111   ALKPHOS 67 10/18/2015 1111   BILITOT 0.8 10/18/2015 1111   GFRNONAA >60 04/13/2016 0819   GFRAA >60 04/13/2016 0819    Assessment and Plan :  1. Chronic atrial fibrillation (HCC)  - POCT INR 2.9 today. Continue 4 mg daily and recheck in 2-3 weeks.   2. Acute prostatitis Improved.   3. Bronchitis   4. Anxiety and depression 40% improved per wife. Patient and wife are pleased with progress and they would like to stay on the same dose of medication. Return to clinic 2-3 months  5. Smell disturbance  6. Mild cognitive impairment 7. Atrial fibrillation Lifelong anticoagulation 8. History of DVT  HPI, Exam, and A&P Transcribed under the direction and in the presence of Lemon Whitacre L. Cranford Mon, MD  Electronically Signed: Webb Laws, [place] MD [facility] 04/29/2016 1:49 PM

## 2016-05-01 ENCOUNTER — Telehealth: Payer: Self-pay

## 2016-05-01 LAB — URINE CULTURE: Organism ID, Bacteria: NO GROWTH

## 2016-05-01 NOTE — Telephone Encounter (Signed)
Pt advised.   Thanks,   -Laura  

## 2016-05-01 NOTE — Telephone Encounter (Signed)
-----   Message from Jerrol Banana., MD sent at 05/01/2016  9:09 AM EDT ----- Urine is normal.

## 2016-05-02 ENCOUNTER — Ambulatory Visit: Payer: Commercial Managed Care - HMO

## 2016-05-21 ENCOUNTER — Ambulatory Visit (INDEPENDENT_AMBULATORY_CARE_PROVIDER_SITE_OTHER): Payer: Commercial Managed Care - HMO

## 2016-05-21 DIAGNOSIS — Z23 Encounter for immunization: Secondary | ICD-10-CM

## 2016-05-21 DIAGNOSIS — I482 Chronic atrial fibrillation, unspecified: Secondary | ICD-10-CM

## 2016-05-21 LAB — POCT INR
INR: 1.9
PT: 23.9

## 2016-05-21 NOTE — Patient Instructions (Signed)
Anticoagulation Dose Instructions as of 05/21/2016      Casey Gallegos Tue Wed Thu Fri Sat   New Dose 4 mg 4 mg 4 mg 4 mg 4 mg 4 mg 4 mg    Description   Take 8 mg for 2 days, then resume 4 mg daily. Return in 2 weeks

## 2016-05-28 DIAGNOSIS — I071 Rheumatic tricuspid insufficiency: Secondary | ICD-10-CM | POA: Diagnosis not present

## 2016-05-28 DIAGNOSIS — I82409 Acute embolism and thrombosis of unspecified deep veins of unspecified lower extremity: Secondary | ICD-10-CM | POA: Diagnosis not present

## 2016-05-28 DIAGNOSIS — I10 Essential (primary) hypertension: Secondary | ICD-10-CM | POA: Diagnosis not present

## 2016-05-28 DIAGNOSIS — I482 Chronic atrial fibrillation: Secondary | ICD-10-CM | POA: Diagnosis not present

## 2016-05-28 DIAGNOSIS — I429 Cardiomyopathy, unspecified: Secondary | ICD-10-CM | POA: Diagnosis not present

## 2016-05-28 DIAGNOSIS — E782 Mixed hyperlipidemia: Secondary | ICD-10-CM | POA: Diagnosis not present

## 2016-06-04 ENCOUNTER — Telehealth: Payer: Self-pay

## 2016-06-04 ENCOUNTER — Ambulatory Visit (INDEPENDENT_AMBULATORY_CARE_PROVIDER_SITE_OTHER): Payer: Commercial Managed Care - HMO

## 2016-06-04 DIAGNOSIS — I482 Chronic atrial fibrillation, unspecified: Secondary | ICD-10-CM

## 2016-06-04 DIAGNOSIS — M5136 Other intervertebral disc degeneration, lumbar region: Secondary | ICD-10-CM

## 2016-06-04 LAB — POCT INR
AVAILABLE: 22
INR: 1.8

## 2016-06-04 NOTE — Patient Instructions (Signed)
Anticoagulation Dose Instructions as of 06/04/2016      Dorene Grebe Tue Wed Thu Fri Sat   New Dose 4 mg 5 mg 4 mg 5 mg 4 mg 5 mg 4 mg    Description   Take  5 mg  M,W, F and every other day '4mg'$ . Return in 2 weeks

## 2016-06-04 NOTE — Telephone Encounter (Signed)
Patient is requesting refill on his prescription for Oxycodone Hcl '10mg'$  sent over to [facility]. KW

## 2016-06-05 ENCOUNTER — Other Ambulatory Visit: Payer: Self-pay

## 2016-06-05 DIAGNOSIS — M5136 Other intervertebral disc degeneration, lumbar region: Secondary | ICD-10-CM

## 2016-06-05 MED ORDER — OXYCODONE-ACETAMINOPHEN 5-325 MG PO TABS: 1.0000 | ORAL_TABLET | Freq: Four times a day (QID) | ORAL | 0 refills | Status: DC | PRN

## 2016-06-05 MED ORDER — OXYCODONE-ACETAMINOPHEN 5-325 MG PO TABS: 1.0000 | ORAL_TABLET | Freq: Three times a day (TID) | ORAL | 0 refills | Status: DC | PRN

## 2016-06-05 NOTE — Telephone Encounter (Signed)
Pt advised, RX ready-aa

## 2016-06-05 NOTE — Telephone Encounter (Signed)
Ok to write rx for 100 tabs.

## 2016-06-20 ENCOUNTER — Ambulatory Visit (INDEPENDENT_AMBULATORY_CARE_PROVIDER_SITE_OTHER): Payer: Commercial Managed Care - HMO

## 2016-06-20 DIAGNOSIS — I482 Chronic atrial fibrillation, unspecified: Secondary | ICD-10-CM

## 2016-06-20 LAB — POCT INR
INR: 2.4
PT: 28.7

## 2016-06-20 NOTE — Patient Instructions (Signed)
Anticoagulation Dose Instructions as of 06/20/2016      Casey Gallegos Tue Wed Thu Fri Sat   New Dose 4 mg 5 mg 4 mg 5 mg 4 mg 5 mg 4 mg    Description   Alternate 4 mg and 5 mg. Return in 4 weeks

## 2016-06-30 ENCOUNTER — Telehealth: Payer: Self-pay | Admitting: Family Medicine

## 2016-06-30 DIAGNOSIS — N411 Chronic prostatitis: Secondary | ICD-10-CM

## 2016-06-30 DIAGNOSIS — N400 Enlarged prostate without lower urinary tract symptoms: Secondary | ICD-10-CM

## 2016-06-30 NOTE — Telephone Encounter (Signed)
Order put in-aa

## 2016-06-30 NOTE — Telephone Encounter (Signed)
Pt is requesting referral to see Dr Hollice Espy for urinary frequency.history of enlarged prostate.He states that he used to see Dr Bernardo Heater and had records transferred to Dr Erlene Quan.He already has appointment scheduled 07/16/16 but has [employer] and needs approval from PCP

## 2016-07-03 ENCOUNTER — Telehealth: Payer: Self-pay | Admitting: Family Medicine

## 2016-07-03 NOTE — Telephone Encounter (Signed)
Called Pt to schedule AWV with [facility] and CPE with Rosanna Randy 2/27- knb

## 2016-07-08 IMAGING — CR DG CHEST 2V
1 series · 2 of 2 positions shown · non-contrast
Comparison: Chest x-ray of 01/14/2013

CLINICAL DATA: Productive cough for 3 weeks

EXAM:
CHEST  2 VIEW

[Series 1: kdxr chest pa (or ap) and lat · 0.14mm/px · 2 of 2 slices shown]
[im 1/2]
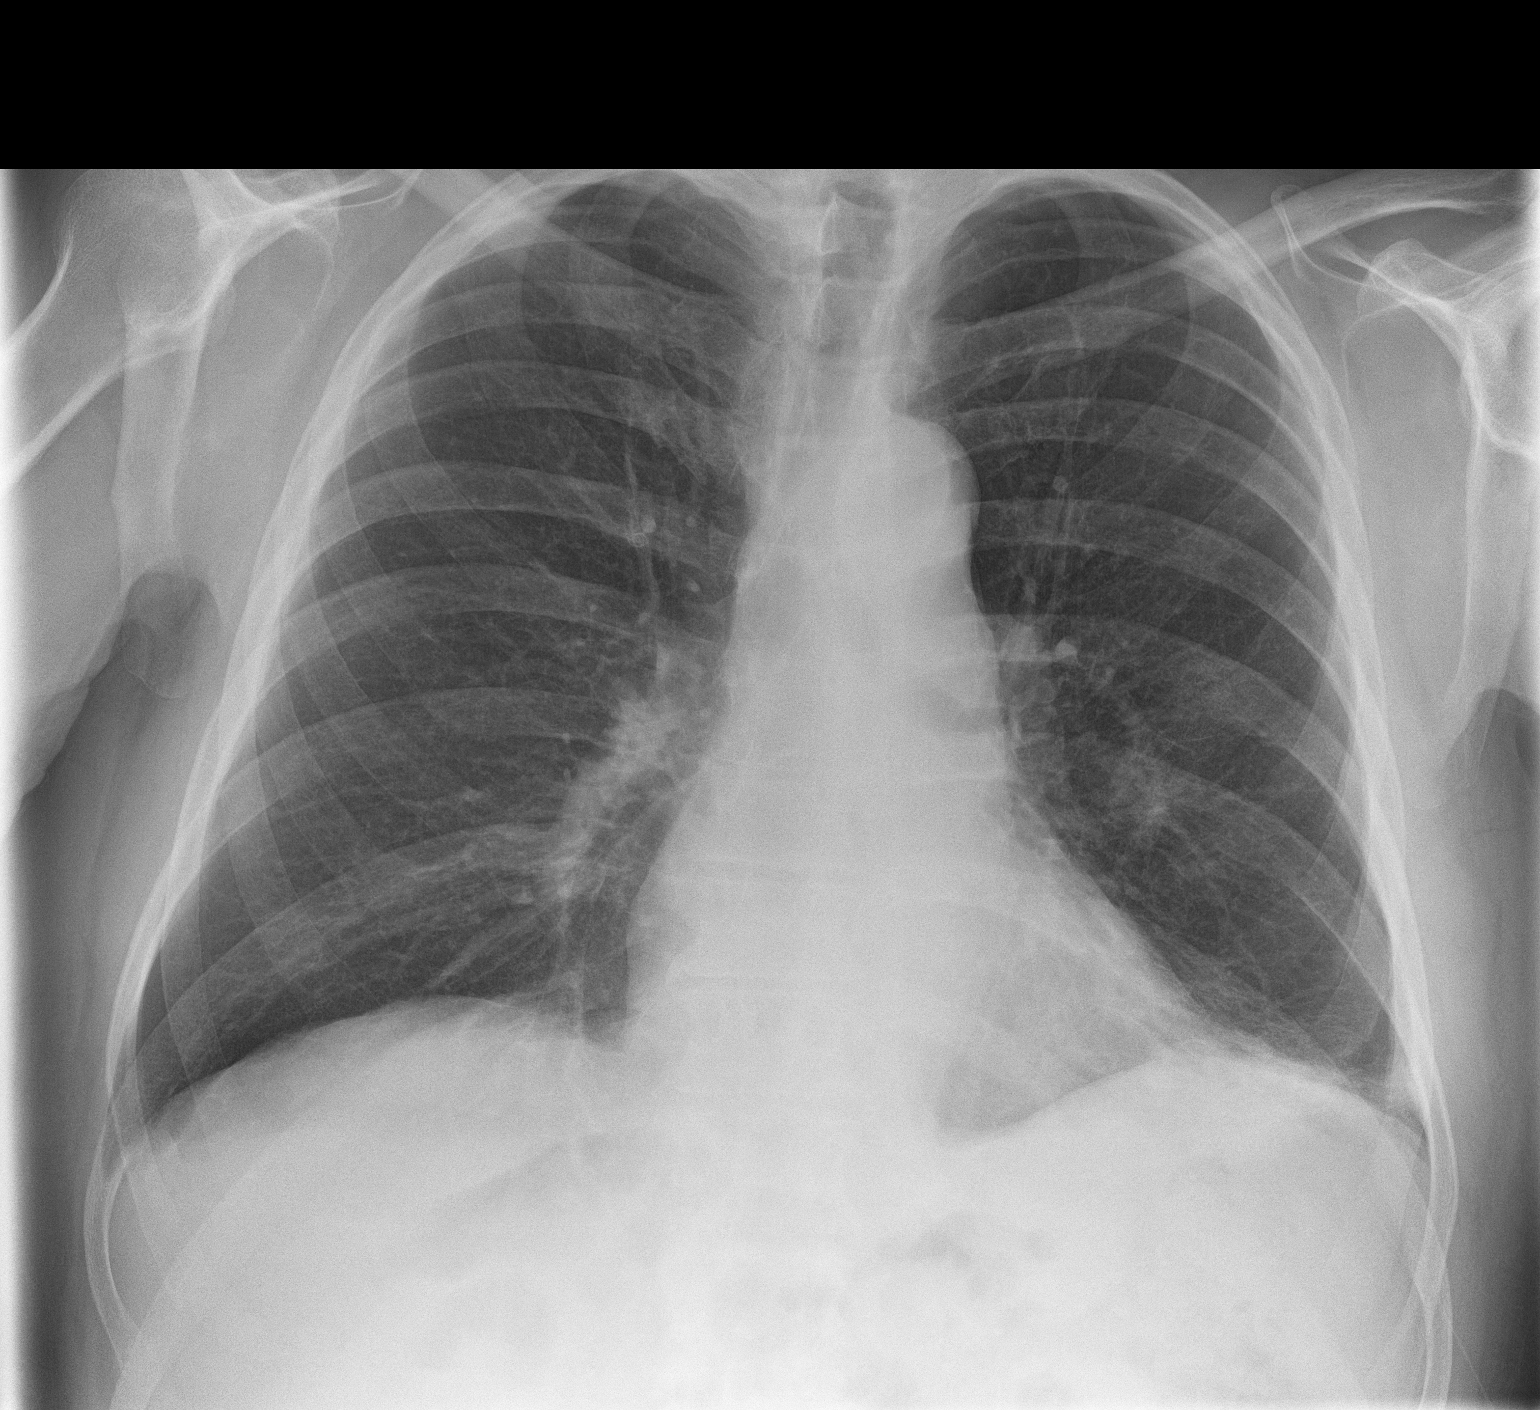
[im 2/2]
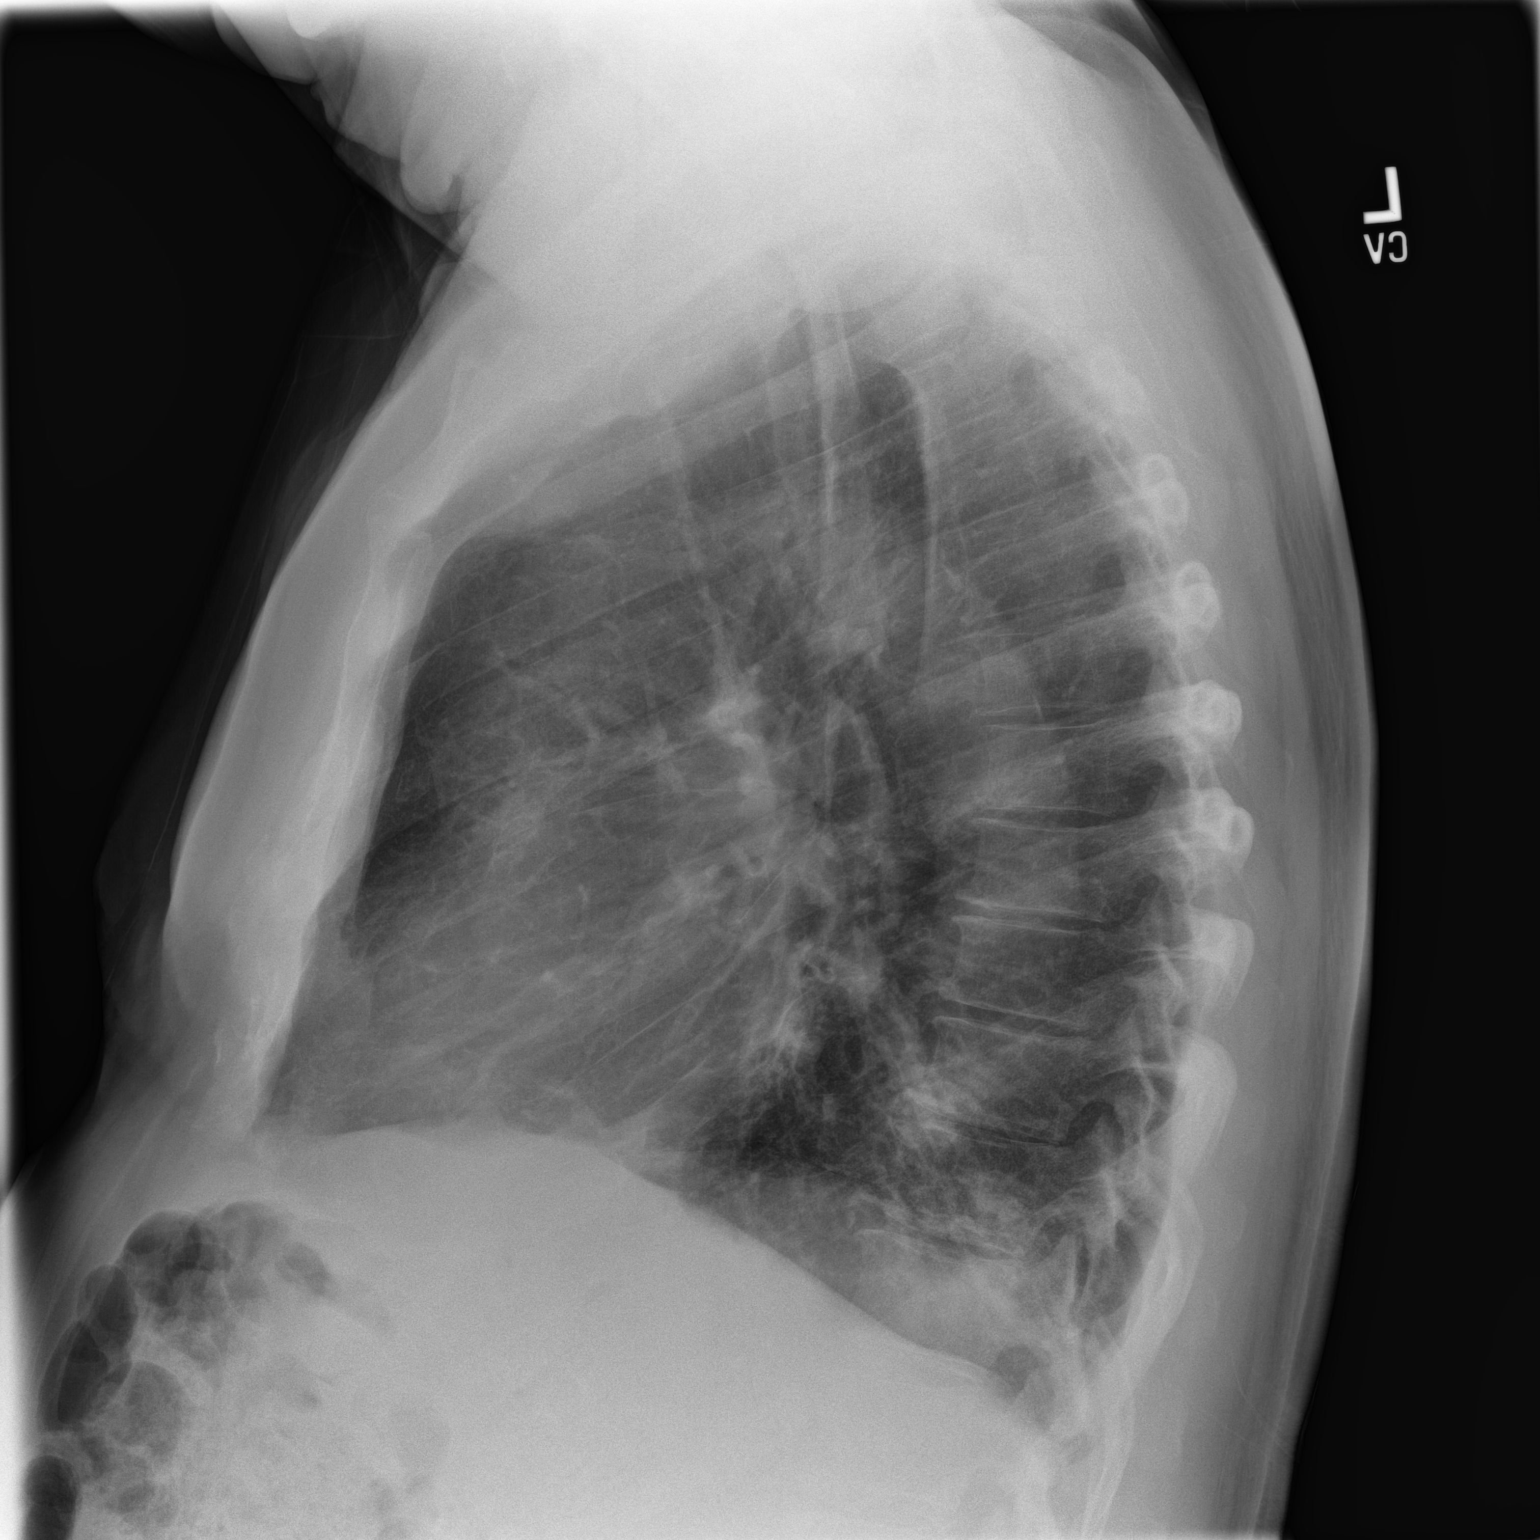

[2 of 2 positions shown; findings below may reference images not displayed]

FINDINGS: There is a parenchymal opacity deep at the left lung base consistent
with left lower lobe pneumonia. No definite effusion is seen. There
is some peribronchial thickening as well which may indicate
bronchitis. The right lung is clear. Mediastinal and hilar contours
are unremarkable. The heart is within normal limits in size. No bony
abnormality is seen.
IMPRESSION: 1. Left lower lobe pneumonia.
2. Probable bronchitis.

## 2016-07-16 ENCOUNTER — Ambulatory Visit (INDEPENDENT_AMBULATORY_CARE_PROVIDER_SITE_OTHER): Payer: Commercial Managed Care - HMO

## 2016-07-16 ENCOUNTER — Ambulatory Visit: Payer: Commercial Managed Care - HMO | Admitting: Urology

## 2016-07-16 ENCOUNTER — Encounter: Payer: Self-pay | Admitting: Urology

## 2016-07-16 VITALS — BP 175/95 | HR 60 | Ht 69.0 in | Wt 208.0 lb

## 2016-07-16 DIAGNOSIS — N138 Other obstructive and reflux uropathy: Secondary | ICD-10-CM | POA: Diagnosis not present

## 2016-07-16 DIAGNOSIS — I482 Chronic atrial fibrillation, unspecified: Secondary | ICD-10-CM

## 2016-07-16 DIAGNOSIS — N401 Enlarged prostate with lower urinary tract symptoms: Secondary | ICD-10-CM | POA: Diagnosis not present

## 2016-07-16 DIAGNOSIS — Z87898 Personal history of other specified conditions: Secondary | ICD-10-CM

## 2016-07-16 DIAGNOSIS — N529 Male erectile dysfunction, unspecified: Secondary | ICD-10-CM | POA: Diagnosis not present

## 2016-07-16 LAB — URINALYSIS, COMPLETE
Bilirubin, UA: NEGATIVE
Glucose, UA: NEGATIVE
KETONES UA: NEGATIVE
Leukocytes, UA: NEGATIVE
NITRITE UA: NEGATIVE
PH UA: 7 (ref 5.0–7.5)
Protein, UA: NEGATIVE
RBC, UA: NEGATIVE
SPEC GRAV UA: 1.015 (ref 1.005–1.030)
Urobilinogen, Ur: 0.2 mg/dL (ref 0.2–1.0)

## 2016-07-16 LAB — POCT INR
INR: 2.7
PT: 32.3

## 2016-07-16 LAB — BLADDER SCAN AMB NON-IMAGING

## 2016-07-16 LAB — MICROSCOPIC EXAMINATION
Bacteria, UA: NONE SEEN
EPITHELIAL CELLS (NON RENAL): NONE SEEN /HPF (ref 0–10)
WBC UA: NONE SEEN /HPF (ref 0–?)

## 2016-07-16 MED ORDER — FINASTERIDE 5 MG PO TABS: 5.0000 mg | ORAL_TABLET | Freq: Every day | ORAL | 3 refills | Status: DC

## 2016-07-16 NOTE — Progress Notes (Signed)
07/16/2016 9:41 AM   Casey Gallegos 1935-06-15 [id number]  Referring provider: Jerrol Banana., MD [address] [place], [place] [address]  Chief Complaint  Patient presents with  . Urinary Frequency    New Patient  . Dysuria    HPI: 81 year old male who presents today to establish care of Casey Gallegos. He was previously followed by [facility] urology and is interested in establishing care here [place].  BPH with LUTS  TRUS vol 72 cc in 2012.  IPSS today 26/3  His primary complain today is incomplete bladder emyting.  He notes that his is worst in the AM and voids several times first thing in the AM to try and empty.  He has a weak stram.  He also has .  He gets up 1 nightly to void.   He has some post void dribbling.  He occationally has urgency particurly when getting up after sitting prolonged period of time.    He occationally has dysuria inclduing today.  No blood in the urine.  PVR 237 today  He is on cardura 8 mg fo r BPH.    ED  He does have baseline ED.  He has used used Viagra in the past (100 mg) but did not help much.  Cialsis did work best.  He is not intesretd in treatment at time.    History of elevated PSA Personal history of elevated PSA status post negative prostate biopsy in 05/2011 by Dr. Bernardo Heater.    Review of previous records indicate that his PSA was as high as 11.6 in May 2011. He was treated with him. Course of antibiotics and decreased his PSA to 4.5. His history of fluctuating PSA and has been as high as 13.      IPSS    Row Name 07/16/16 1000         International Prostate Symptom Score   How often have you had the sensation of not emptying your bladder? Almost always     How often have you had to urinate less than every two hours? Almost always     How often have you found you stopped and started again several times when you urinated? About half the time     How often have you found it difficult to  postpone urination? More than half the time     How often have you had a weak urinary stream? Almost always     How often have you had to strain to start urination? About half the time     How many times did you typically get up at night to urinate? 1 Time     Total IPSS Score 26       Quality of Life due to urinary symptoms   If you were to spend the rest of your life with your urinary condition just the way it is now how would you feel about that? Mixed        Score:  1-7 Mild 8-19 Moderate 20-35 Severe   PMH: Past Medical History:  Diagnosis Date  . A-fib ([place])   . Anemia   . DVT (deep venous thrombosis) (HCC)    Monitor with PT/INR. Target 2.0 - 3.0   . Elevated PSA   . H/O degenerative disc disease   . Hypertension   . Lipomatosis   . Night sweats   . Obesity     Surgical History: Past Surgical History:  Procedure Laterality Date  . Bilateral  Radical Keratotomy Bilateral 1997  . COLONOSCOPY WITH PROPOFOL N/A 01/11/2016   Procedure: COLONOSCOPY WITH PROPOFOL;  Surgeon: Manya Silvas, MD;  Location: [facility] ENDOSCOPY;  Service: Endoscopy;  Laterality: N/A; repeat 3 years, tubular adenoma  . deep vein thrombosis    . DENTAL SURGERY     Patient had implants  . GUM SURGERY    . L4 - S1 decompression Left    Left-sided  . PROSTATE BIOPSY    . TONSILLECTOMY AND ADENOIDECTOMY  age 11    Home Medications:    Medication List       Accurate as of 07/16/16 11:59 PM. Always use your most recent med list.          ALPRAZolam 0.25 MG tablet Commonly known as:  XANAX Take 1 tablet (0.25 mg total) by mouth every 6 (six) hours as needed for anxiety.   amLODipine 5 MG tablet Commonly known as:  NORVASC Take 1 tablet (5 mg total) by mouth daily.   benazepril 40 MG tablet Commonly known as:  LOTENSIN Take 1 tablet (40 mg total) by mouth daily.   carvedilol 6.25 MG tablet Commonly known as:  COREG Take 1 tablet (6.25 mg total) by mouth 2 (two) times daily.     doxazosin 8 MG tablet Commonly known as:  CARDURA Take 1 tablet (8 mg total) by mouth daily.   finasteride 5 MG tablet Commonly known as:  PROSCAR Take 1 tablet (5 mg total) by mouth daily.   gabapentin 300 MG capsule Commonly known as:  NEURONTIN Take 1 capsule (300 mg total) by mouth 3 (three) times daily.   MULTIPLE VITAMIN PO Take by mouth.   oxyCODONE-acetaminophen 5-325 MG tablet Commonly known as:  ROXICET Take 1 tablet by mouth every 6 (six) hours as needed for severe pain.   VIAGRA 100 MG tablet Generic drug:  sildenafil Take by mouth.   warfarin 1 MG tablet Commonly known as:  COUMADIN Take by mouth.   warfarin 4 MG tablet Commonly known as:  COUMADIN Take 1 tablet (4 mg total) by mouth one time only at 6 PM.   warfarin 5 MG tablet Commonly known as:  COUMADIN TAKE 1 TABLET EVERY DAY       Allergies:  Allergies  Allergen Reactions  . Cefdinir Nausea Only    hypersensitivity to smell  . Doxycycline     Sun sensitivity  . Prednisone   . Sertraline Other (See Comments)    Hallucinations     Family History: Family History  Problem Relation Age of Onset  . Cancer Mother     secondary to cancinoma of the jaw from her dipping snuff.  . Heart attack Father   . CAD Father   . Hypertension Sister   . Diabetes Son     Type I diabetes  . Prostate cancer Neg Hx   . Kidney cancer Neg Hx     Social History:  reports that he quit smoking about 44 years ago. He has never used smokeless tobacco. He reports that he drinks alcohol. He reports that he does not use drugs.  ROS: UROLOGY Frequent Urination?: Yes Hard to postpone urination?: Yes Burning/pain with urination?: Yes Get up at night to urinate?: Yes Leakage of urine?: Yes Urine stream starts and stops?: Yes Trouble starting stream?: Yes Do you have to strain to urinate?: Yes Blood in urine?: No Urinary tract infection?: Yes Sexually transmitted disease?: No Injury to kidneys or bladder?:  No Painful intercourse?: No Weak  stream?: Yes Erection problems?: Yes Penile pain?: Yes  Gastrointestinal Nausea?: No Vomiting?: No Indigestion/heartburn?: No Diarrhea?: No Constipation?: No  Constitutional Fever: No Night sweats?: No Weight loss?: No Fatigue?: No  Skin Skin rash/lesions?: No Itching?: No  Eyes Blurred vision?: No Double vision?: No  Ears/Nose/Throat Sore throat?: No Sinus problems?: No  Hematologic/Lymphatic Swollen glands?: No Easy bruising?: Yes  Cardiovascular Leg swelling?: Yes Chest pain?: No  Respiratory Cough?: No Shortness of breath?: No  Endocrine Excessive thirst?: No  Musculoskeletal Back pain?: Yes Joint pain?: No  Neurological Headaches?: No Dizziness?: No  Psychologic Depression?: No Anxiety?: No  Physical Exam: BP (!) 175/95   Pulse 60   Ht '5\' 9"'$  (1.753 m)   Wt 208 lb (94.3 kg)   BMI 30.72 kg/m   Constitutional:  Alert and oriented, No acute distress. HEENT: [place] AT, moist mucus membranes.  Trachea midline, no masses. Cardiovascular: No clubbing, cyanosis, or edema. Respiratory: Normal respiratory effort, no increased work of breathing. GI: Abdomen is soft, nontender, nondistended, no abdominal masses GU: No CVA tenderness.  Rectal: Sphincter tone. 50+ cc prostate, no nodules, nontender. Skin: No rashes, bruises or suspicious lesions. Neurologic: Grossly intact, no focal deficits, moving all 4 extremities. Psychiatric: Normal mood and affect.  Laboratory Data: Lab Results  Component Value Date   WBC 6.5 04/13/2016   HGB 14.6 04/13/2016   HCT 41.1 04/13/2016   MCV 91.3 04/13/2016   PLT 173 04/13/2016    Lab Results  Component Value Date   CREATININE 1.12 04/13/2016    Urinalysis Results for orders placed or performed in visit on 07/16/16  Microscopic Examination  Result Value Ref Range   WBC, UA None seen 0 - 5 /hpf   RBC, UA 0-2 0 - 2 /hpf   Epithelial Cells (non renal) None seen 0 - 10  /hpf   Bacteria, UA None seen None seen/Few  Urinalysis, Complete  Result Value Ref Range   Specific Gravity, UA 1.015 1.005 - 1.030   pH, UA 7.0 5.0 - 7.5   Color, UA Yellow Yellow   Appearance Ur Clear Clear   Leukocytes, UA Negative Negative   Protein, UA Negative Negative/Trace   Glucose, UA Negative Negative   Ketones, UA Negative Negative   RBC, UA Negative Negative   Bilirubin, UA Negative Negative   Urobilinogen, Ur 0.2 0.2 - 1.0 mg/dL   Nitrite, UA Negative Negative   Microscopic Examination See below:   PSA  Result Value Ref Range   Prostate Specific Ag, Serum 12.1 (H) 0.0 - 4.0 ng/mL  BLADDER SCAN AMB NON-IMAGING  Result Value Ref Range   Scan Result 258m      Assessment & Plan:   1. BPH with obstruction/lower urinary tract symptoms/ incomplete bladder emptying Fairly symptomatic with primarily obstructive urinary symptoms Elevated postvoid residual today, patient states he does better in the afternoon and evening's Reviewed signs and symptoms of urinary retention in detail along with instructions for seeking urgent care for unable to void Recommend the addition of finasteride to regimen- and continue Cardura If fails maximal medical therapy, may need outlet procedure If symptoms fail to improve, will plan for cystoscopy next visit to evaluate his anatomy for surgical planning purposes Removal of this plan - Urinalysis, Complete - BLADDER SCAN AMB NON-IMAGING - PSA  2. History of elevated PSA Check PSA today came history of worsening urinary symptoms- known history of PSA fluctuation up to 13 in the past Will likely not intervene unless PSA is significantly  elevated  3. Erectile dysfunction, unspecified erectile dysfunction type Discussed alternative options for treatment of refractory ED, not interested at this time   Return in about 3 months (around 10/16/2016) for recheck IPSS, PVR, possible cysto.  Hollice Espy, MD  [facility] [address], [place], [place] [address] [phone]

## 2016-07-16 NOTE — Patient Instructions (Signed)
Anticoagulation Dose Instructions as of 07/16/2016      Casey Gallegos Tue Wed Thu Fri Sat   New Dose 4 mg 5 mg 4 mg 5 mg 4 mg 5 mg 4 mg    Description   Alternate 4 mg and 5 mg. Return in 4 weeks

## 2016-07-17 LAB — PSA: PROSTATE SPECIFIC AG, SERUM: 12.1 ng/mL — AB (ref 0.0–4.0)

## 2016-07-28 ENCOUNTER — Ambulatory Visit (INDEPENDENT_AMBULATORY_CARE_PROVIDER_SITE_OTHER): Payer: Commercial Managed Care - HMO | Admitting: Family Medicine

## 2016-07-28 ENCOUNTER — Encounter: Payer: Self-pay | Admitting: Family Medicine

## 2016-07-28 VITALS — BP 136/72 | HR 64 | Temp 97.6°F | Resp 16 | Wt 209.0 lb

## 2016-07-28 DIAGNOSIS — I10 Essential (primary) hypertension: Secondary | ICD-10-CM | POA: Diagnosis not present

## 2016-07-28 DIAGNOSIS — R739 Hyperglycemia, unspecified: Secondary | ICD-10-CM | POA: Diagnosis not present

## 2016-07-28 DIAGNOSIS — R972 Elevated prostate specific antigen [PSA]: Secondary | ICD-10-CM | POA: Diagnosis not present

## 2016-07-28 DIAGNOSIS — I482 Chronic atrial fibrillation, unspecified: Secondary | ICD-10-CM

## 2016-07-28 NOTE — Progress Notes (Signed)
Subjective:  HPI  Hypertension, follow-up:  BP Readings from Last 3 Encounters:  07/28/16 136/72  07/16/16 (!) 175/95  04/29/16 138/74    He was last seen for hypertension 3 months ago.  BP at that visit was 175/95. Management since that visit includes Pt BP was low (he had bronchitis) and Triamterene- HCTZ was stopped, but pt noticed it going back up and restarted the medication. He reports good compliance with treatment. He is not having side effects.  He is exercising. He is adherent to low salt diet.   Outside blood pressures are running about like today, but it was elevated this morning. He is experiencing none.  Patient denies chest pain, chest pressure/discomfort, claudication, dyspnea, exertional chest pressure/discomfort, fatigue, irregular heart beat, lower extremity edema, near-syncope, orthopnea, palpitations, paroxysmal nocturnal dyspnea and syncope.   Cardiovascular risk factors include advanced age (older than 82 for men, 26 for women), hypertension, male gender and smoking/ tobacco exposure.  Wt Readings from Last 3 Encounters:  07/28/16 209 lb (94.8 kg)  07/16/16 208 lb (94.3 kg)  04/29/16 214 lb (97.1 kg)   ------------------------------------------------------------------------ Anxiety/Depression- Pt reports that he is doing well emotionally.   Pt went to Urology because Dr. Bernardo Heater was in [facility] and he did not want to drive there. He has questions about his PSA. She checked it and he saw it on MyChart but Ashley at [facility] did not call him.   Prior to Admission medications   Medication Sig Start Date End Date Taking? Authorizing Provider  ALPRAZolam (XANAX) 0.25 MG tablet Take 1 tablet (0.25 mg total) by mouth every 6 (six) hours as needed for anxiety. 04/15/16   Richard Maceo Pro., MD  amLODipine (NORVASC) 5 MG tablet Take 1 tablet (5 mg total) by mouth daily. 02/14/16   Richard Maceo Pro., MD  benazepril (LOTENSIN) 40 MG tablet Take 1 tablet (40 mg  total) by mouth daily. 02/14/16   Richard Maceo Pro., MD  carvedilol (COREG) 6.25 MG tablet Take 1 tablet (6.25 mg total) by mouth 2 (two) times daily. 02/14/16   Richard Maceo Pro., MD  doxazosin (CARDURA) 8 MG tablet Take 1 tablet (8 mg total) by mouth daily. 02/14/16   Richard Maceo Pro., MD  finasteride (PROSCAR) 5 MG tablet Take 1 tablet (5 mg total) by mouth daily. 07/16/16   Hollice Espy, MD  gabapentin (NEURONTIN) 300 MG capsule Take 1 capsule (300 mg total) by mouth 3 (three) times daily. 02/14/16   Richard Maceo Pro., MD  MULTIPLE VITAMIN PO Take by mouth.    Historical Provider, MD  oxyCODONE-acetaminophen (ROXICET) 5-325 MG tablet Take 1 tablet by mouth every 6 (six) hours as needed for severe pain. 06/05/16   Richard Maceo Pro., MD  sildenafil (VIAGRA) 100 MG tablet Take by mouth. 11/27/11   Historical Provider, MD  warfarin (COUMADIN) 1 MG tablet Take by mouth. 08/10/13   Historical Provider, MD  warfarin (COUMADIN) 4 MG tablet Take 1 tablet (4 mg total) by mouth one time only at 6 PM. 02/14/16   Jerrol Banana., MD  warfarin (COUMADIN) 5 MG tablet TAKE 1 TABLET EVERY DAY 04/22/16   Jerrol Banana., MD    Patient Active Problem List   Diagnosis Date Noted  . A-fib ([place]) 03/02/2015  . Deep vein thrombosis ([place]) 03/02/2015  . Essential (primary) hypertension 03/02/2015  . Combined fat and carbohydrate induced hyperlipemia 03/02/2015  . Heart valve disease 03/02/2015  .  Allergic rhinitis 02/28/2015  . Absolute anemia 02/28/2015  . 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 Back pain, chronic 02/28/2015  . Sex counseling 02/28/2015  . Narrowing of intervertebral disc space 02/28/2015  . Acute thromboembolism of deep veins of lower extremity ([place]) 02/28/2015  . ED (erectile dysfunction) of organic origin 02/28/2015  . Abnormal prostate specific antigen 02/28/2015  . BP (high blood pressure) 02/28/2015  . Lipomatosis 02/28/2015  . Malaise and fatigue  02/28/2015  . Generalized hyperhidrosis 02/28/2015  . Arthritis, degenerative 02/28/2015  . Adiposity 02/28/2015  . AF (paroxysmal atrial fibrillation) ([place]) 02/28/2015  . Chronic prostatitis 02/28/2015  . Spinal stenosis 02/28/2015  . Atypical pneumonia 02/28/2015    Past Medical History:  Diagnosis Date  . A-fib ([place])   . Anemia   . DVT (deep venous thrombosis) (HCC)    Monitor with PT/INR. Target 2.0 - 3.0   . Elevated PSA   . H/O degenerative disc disease   . Hypertension   . Lipomatosis   . Night sweats   . Obesity     Social History   Social History  . Marital status: Married    Spouse name: N/A  . Number of children: N/A  . Years of education: N/A   Occupational History  . Not on file.   Social History Main Topics  . Smoking status: Former Smoker    Quit date: 08/26/1971  . Smokeless tobacco: Never Used     Comment: Smoked for about 20 years.  . Alcohol use Yes     Comment: Occasional  . Drug use: No  . Sexual activity: Not on file   Other Topics Concern  . Not on file   Social History Narrative  . No narrative on file    Allergies  Allergen Reactions  . Cefdinir Nausea Only    hypersensitivity to smell  . Doxycycline     Sun sensitivity  . Prednisone   . Sertraline Other (See Comments)    Hallucinations     Review of Systems  Constitutional: Negative.   HENT: Negative.   Eyes: Negative.   Respiratory: Negative.   Cardiovascular: Negative.   Gastrointestinal: Negative.   Genitourinary: Negative.   Musculoskeletal: Negative.   Skin: Negative.   Neurological: Negative.   Endo/Heme/Allergies: Negative.   Psychiatric/Behavioral: Negative.     Immunization History  Administered Date(s) Administered  . Influenza, High Dose Seasonal PF 05/11/2015, 05/21/2016  . Pneumococcal Conjugate-13 01/09/2014  . Pneumococcal Polysaccharide-23 08/08/2004  . Td 10/25/2008    Objective:  BP 136/72 (BP Location: Left Arm, Patient Position: Sitting,  Cuff Size: Normal)   Pulse 64   Temp 97.6 F (36.4 C) (Oral)   Resp 16   Wt 209 lb (94.8 kg)   BMI 30.86 kg/m   Physical Exam  Constitutional: He is oriented to person, place, and time and well-developed, well-nourished, and in no distress.  HENT:  Head: Normocephalic and atraumatic.  Eyes: Conjunctivae and EOM are normal. Pupils are equal, round, and reactive to light.  Neck: Normal range of motion. Neck supple.  Cardiovascular: Normal rate, regular rhythm, normal heart sounds and intact distal pulses.   Pulmonary/Chest: Effort normal and breath sounds normal.  Abdominal: Soft.  Musculoskeletal: Normal range of motion.  Neurological: He is alert and oriented to person, place, and time. He has normal reflexes. Gait normal. GCS score is 15.  Skin: Skin is warm and dry.  Psychiatric: Mood, memory, affect and judgment normal.    Lab Results  Component Value Date   WBC 6.5 04/13/2016   HGB 14.6 04/13/2016   HCT 41.1 04/13/2016   PLT 173 04/13/2016   GLUCOSE 132 (H) 04/13/2016   CHOL 178 10/18/2015   TRIG 72 10/18/2015   HDL 67 10/18/2015   LDLCALC 97 10/18/2015   TSH 1.830 10/18/2015   INR 2.7 07/16/2016    CMP     Component Value Date/Time   NA 138 04/13/2016 0819   NA 145 (H) 10/18/2015 1111   K 3.5 04/13/2016 0819   CL 104 04/13/2016 0819   CO2 25 04/13/2016 0819   GLUCOSE 132 (H) 04/13/2016 0819   BUN 22 (H) 04/13/2016 0819   BUN 23 10/18/2015 1111   CREATININE 1.12 04/13/2016 0819   CALCIUM 9.0 04/13/2016 0819   PROT 6.5 10/18/2015 1111   ALBUMIN 4.1 10/18/2015 1111   AST 22 10/18/2015 1111   ALT 20 10/18/2015 1111   ALKPHOS 67 10/18/2015 1111   BILITOT 0.8 10/18/2015 1111   GFRNONAA >60 04/13/2016 0819   GFRAA >60 04/13/2016 0819    Assessment and Plan :  1. Essential hypertension  - CBC with Differential/Platelet - TSH  2. Chronic atrial fibrillation (HCC)   3. Abnormal prostate specific antigen   4. Hyperglycemia  - Hemoglobin A1c -  Comprehensive metabolic panel 5. LS DDD Pt in chronic pain and this is stasrting to affect him emotionally. 6.Mild Depression Follow clinically.  HPI, Exam, and A&P Transcribed under the direction and in the presence of Richard L. Cranford Mon, MD  Electronically Signed: Webb Laws, [place] MD [facility] 07/28/2016 11:29 AM

## 2016-07-29 LAB — CBC WITH DIFFERENTIAL/PLATELET
BASOS: 0 %
Basophils Absolute: 0 10*3/uL (ref 0.0–0.2)
EOS (ABSOLUTE): 0.1 10*3/uL (ref 0.0–0.4)
Eos: 2 %
Hematocrit: 40.4 % (ref 37.5–51.0)
Hemoglobin: 13.2 g/dL (ref 13.0–17.7)
IMMATURE GRANULOCYTES: 0 %
Immature Grans (Abs): 0 10*3/uL (ref 0.0–0.1)
LYMPHS ABS: 1.8 10*3/uL (ref 0.7–3.1)
Lymphs: 28 %
MCH: 30.6 pg (ref 26.6–33.0)
MCHC: 32.7 g/dL (ref 31.5–35.7)
MCV: 94 fL (ref 79–97)
MONOS ABS: 0.7 10*3/uL (ref 0.1–0.9)
Monocytes: 11 %
NEUTROS PCT: 59 %
Neutrophils Absolute: 3.7 10*3/uL (ref 1.4–7.0)
PLATELETS: 176 10*3/uL (ref 150–379)
RBC: 4.32 x10E6/uL (ref 4.14–5.80)
RDW: 14.9 % (ref 12.3–15.4)
WBC: 6.3 10*3/uL (ref 3.4–10.8)

## 2016-07-29 LAB — COMPREHENSIVE METABOLIC PANEL
ALK PHOS: 72 IU/L (ref 39–117)
ALT: 24 IU/L (ref 0–44)
AST: 18 IU/L (ref 0–40)
Albumin/Globulin Ratio: 1.9 (ref 1.2–2.2)
Albumin: 4.1 g/dL (ref 3.5–4.8)
BUN/Creatinine Ratio: 22 (ref 10–24)
BUN: 22 mg/dL (ref 8–27)
Bilirubin Total: 0.8 mg/dL (ref 0.0–1.2)
CO2: 25 mmol/L (ref 18–29)
CREATININE: 1 mg/dL (ref 0.76–1.27)
Calcium: 9.1 mg/dL (ref 8.6–10.2)
Chloride: 103 mmol/L (ref 96–106)
GFR calc Af Amer: 82 mL/min/{1.73_m2} (ref >59)
GFR calc non Af Amer: 71 mL/min/{1.73_m2} (ref >59)
Globulin, Total: 2.2 g/dL (ref 1.5–4.5)
Glucose: 93 mg/dL (ref 65–99)
Potassium: 4.3 mmol/L (ref 3.5–5.2)
Sodium: 141 mmol/L (ref 134–144)
TOTAL PROTEIN: 6.3 g/dL (ref 6.0–8.5)

## 2016-07-29 LAB — HEMOGLOBIN A1C
Est. average glucose Bld gHb Est-mCnc: 108 mg/dL
Hgb A1c MFr Bld: 5.4 % (ref 4.8–5.6)

## 2016-07-29 LAB — TSH: TSH: 2.27 u[IU]/mL (ref 0.450–4.500)

## 2016-08-04 ENCOUNTER — Telehealth: Payer: Self-pay | Admitting: Family Medicine

## 2016-08-04 NOTE — Telephone Encounter (Signed)
Pt called this morning saying someone has called him Friday.  He thinks it could be about labs.  His call back is [phone]  [facility]

## 2016-08-05 DIAGNOSIS — H43813 Vitreous degeneration, bilateral: Secondary | ICD-10-CM | POA: Diagnosis not present

## 2016-08-21 ENCOUNTER — Telehealth: Payer: Self-pay | Admitting: Family Medicine

## 2016-08-21 NOTE — Telephone Encounter (Signed)
Called Pt to schedule AWV with [facility] - knb °

## 2016-08-27 ENCOUNTER — Ambulatory Visit (INDEPENDENT_AMBULATORY_CARE_PROVIDER_SITE_OTHER): Payer: Commercial Managed Care - HMO

## 2016-08-27 DIAGNOSIS — I482 Chronic atrial fibrillation, unspecified: Secondary | ICD-10-CM

## 2016-08-27 LAB — POCT INR
INR: 2.4
PT: 29.4

## 2016-08-27 NOTE — Patient Instructions (Signed)
Anticoagulation Dose Instructions as of 08/27/2016      Casey Gallegos Tue Wed Thu Fri Sat   New Dose 4 mg 5 mg 4 mg 5 mg 4 mg 5 mg 4 mg    Description   Alternate 4 mg and 5 mg. Return in 4 weeks

## 2016-09-05 NOTE — Telephone Encounter (Signed)
2nd message Called Pt to schedule AWV with [facility] - knb

## 2016-09-24 ENCOUNTER — Ambulatory Visit (INDEPENDENT_AMBULATORY_CARE_PROVIDER_SITE_OTHER): Payer: Medicare HMO

## 2016-09-24 DIAGNOSIS — I482 Chronic atrial fibrillation, unspecified: Secondary | ICD-10-CM

## 2016-09-24 LAB — POCT INR
INR: 2.2
PT: 26.9

## 2016-09-24 NOTE — Patient Instructions (Signed)
Anticoagulation Dose Instructions as of 09/24/2016      Casey Gallegos Tue Wed Thu Fri Sat   New Dose 4 mg 5 mg 4 mg 5 mg 4 mg 5 mg 4 mg    Description   Alternate 4 mg and 5 mg. Return in 4 weeks

## 2016-10-22 ENCOUNTER — Ambulatory Visit (INDEPENDENT_AMBULATORY_CARE_PROVIDER_SITE_OTHER): Payer: Medicare HMO

## 2016-10-22 ENCOUNTER — Encounter: Payer: Self-pay | Admitting: Urology

## 2016-10-22 ENCOUNTER — Telehealth: Payer: Self-pay | Admitting: Family Medicine

## 2016-10-22 ENCOUNTER — Ambulatory Visit: Payer: Medicare HMO | Admitting: Urology

## 2016-10-22 VITALS — BP 148/75 | HR 62 | Ht 69.0 in | Wt 212.0 lb

## 2016-10-22 DIAGNOSIS — R3129 Other microscopic hematuria: Secondary | ICD-10-CM | POA: Diagnosis not present

## 2016-10-22 DIAGNOSIS — Z87898 Personal history of other specified conditions: Secondary | ICD-10-CM | POA: Diagnosis not present

## 2016-10-22 DIAGNOSIS — N401 Enlarged prostate with lower urinary tract symptoms: Secondary | ICD-10-CM

## 2016-10-22 DIAGNOSIS — N529 Male erectile dysfunction, unspecified: Secondary | ICD-10-CM | POA: Diagnosis not present

## 2016-10-22 DIAGNOSIS — R319 Hematuria, unspecified: Secondary | ICD-10-CM | POA: Diagnosis not present

## 2016-10-22 DIAGNOSIS — I482 Chronic atrial fibrillation, unspecified: Secondary | ICD-10-CM

## 2016-10-22 DIAGNOSIS — N138 Other obstructive and reflux uropathy: Secondary | ICD-10-CM | POA: Diagnosis not present

## 2016-10-22 LAB — URINALYSIS, COMPLETE
Bilirubin, UA: NEGATIVE
GLUCOSE, UA: NEGATIVE
Ketones, UA: NEGATIVE
Leukocytes, UA: NEGATIVE
NITRITE UA: NEGATIVE
PH UA: 7.5 (ref 5.0–7.5)
Protein, UA: NEGATIVE
Specific Gravity, UA: 1.015 (ref 1.005–1.030)
UUROB: 1 mg/dL (ref 0.2–1.0)

## 2016-10-22 LAB — MICROSCOPIC EXAMINATION: Bacteria, UA: NONE SEEN

## 2016-10-22 LAB — BLADDER SCAN AMB NON-IMAGING

## 2016-10-22 LAB — POCT INR
INR: 3.5
PT: 42.1

## 2016-10-22 NOTE — Patient Instructions (Signed)
Anticoagulation Dose Instructions as of 10/22/2016      Casey Gallegos Tue Wed Thu Fri Sat   New Dose 4 mg 5 mg 4 mg 5 mg 4 mg 5 mg 4 mg    Description   Hold x 2 days then resume Alternate 4 mg and 5 mg. Return in 2 weeks

## 2016-10-22 NOTE — Progress Notes (Signed)
10/22/2016 5:10 PM   Casey Gallegos 01-May-1935 [id number]  Referring provider: Jerrol Banana., MD [address] [place], [place] [address]  Chief Complaint  Patient presents with  . Benign Prostatic Hypertrophy    HPI: 82 year old male with BPH, incomplete bladder emptying, and history of elevated PSA.    BPH with LUTS  TRUS vol 72 cc in 2012.  IPSS today 12/2 improved from 26/3 last visit.    He occationally has dysuria inclduing today again.  No gross blood in the urine.  Overall, he is improved.   He has less frequency, urgency, and feels like he is doing a better job emptying his bladder.  PVR 196, slightly improved from 237.    He is on cardura 8 mg fo BPH and finasteride 5 mg started 06/2016.    ED  He does have baseline ED.  He has used used Viagra in the past (100 mg) but did not help much.  Cialsis did work best.  He is not intesretd in treatment at time.    History of elevated PSA Personal history of elevated PSA status post negative prostate biopsy in 05/2011 by Dr. Bernardo Heater.    Review of previous records indicate that his PSA was as high as 11.6 in May 2011. He was treated with him. Course of antibiotics and decreased his PSA to 4.5. His history of fluctuating PSA and has been as high as 13.    Most recent PSA 12.1 on 06/2016.  Rectal exam on 06/2016 shows 50+ cc prostate, otherwise no nodules.      IPSS    Row Name 10/22/16 1000         International Prostate Symptom Score   How often have you had the sensation of not emptying your bladder? Less than half the time     How often have you had to urinate less than every two hours? About half the time     How often have you found you stopped and started again several times when you urinated? Less than half the time     How often have you found it difficult to postpone urination? Less than half the time     How often have you had a weak urinary stream? Less than half the time     How often  have you had to strain to start urination? Less than 1 in 5 times     How many times did you typically get up at night to urinate? 1 Time     Total IPSS Score 13       Quality of Life due to urinary symptoms   If you were to spend the rest of your life with your urinary condition just the way it is now how would you feel about that? Mostly Satisfied        Score:  1-7 Mild 8-19 Moderate 20-35 Severe   PMH: Past Medical History:  Diagnosis Date  . A-fib ([place])   . Anemia   . DVT (deep venous thrombosis) ([facility])    Monitor with PT/INR. Target 2.0 - 3.0   . Elevated PSA   . H/O degenerative disc disease   . Hypertension   . Lipomatosis   . Night sweats   . Obesity     Surgical History: Past Surgical History:  Procedure Laterality Date  . Bilateral Radical Keratotomy Bilateral 1997  . COLONOSCOPY WITH PROPOFOL N/A 01/11/2016   Procedure: COLONOSCOPY WITH PROPOFOL;  Surgeon: Herbie Baltimore  Federico Flake, MD;  Location: [facility] ENDOSCOPY;  Service: Endoscopy;  Laterality: N/A; repeat 3 years, tubular adenoma  . deep vein thrombosis    . DENTAL SURGERY     Patient had implants  . GUM SURGERY    . L4 - S1 decompression Left    Left-sided  . PROSTATE BIOPSY    . TONSILLECTOMY AND ADENOIDECTOMY  age 43    Home Medications:  Allergies as of 10/22/2016      Reactions   Cefdinir Nausea Only   hypersensitivity to smell   Doxycycline    Sun sensitivity   Prednisone    Sertraline Other (See Comments)   Hallucinations       Medication List       Accurate as of 10/22/16  5:10 PM. Always use your most recent med list.          ALPRAZolam 0.25 MG tablet Commonly known as:  XANAX Take 1 tablet (0.25 mg total) by mouth every 6 (six) hours as needed for anxiety.   amLODipine 5 MG tablet Commonly known as:  NORVASC Take 1 tablet (5 mg total) by mouth daily.   benazepril 40 MG tablet Commonly known as:  LOTENSIN Take 1 tablet (40 mg total) by mouth daily.   carvedilol 6.25 MG  tablet Commonly known as:  COREG Take 1 tablet (6.25 mg total) by mouth 2 (two) times daily.   doxazosin 8 MG tablet Commonly known as:  CARDURA Take 1 tablet (8 mg total) by mouth daily.   finasteride 5 MG tablet Commonly known as:  PROSCAR Take 1 tablet (5 mg total) by mouth daily.   gabapentin 300 MG capsule Commonly known as:  NEURONTIN Take 1 capsule (300 mg total) by mouth 3 (three) times daily.   MULTIPLE VITAMIN PO Take by mouth.   oxyCODONE-acetaminophen 5-325 MG tablet Commonly known as:  ROXICET Take 1 tablet by mouth every 6 (six) hours as needed for severe pain.   triamterene-hydrochlorothiazide 37.5-25 MG tablet Commonly known as:  MAXZIDE-25   VIAGRA 100 MG tablet Generic drug:  sildenafil Take by mouth.   warfarin 1 MG tablet Commonly known as:  COUMADIN Take by mouth.   warfarin 4 MG tablet Commonly known as:  COUMADIN Take 1 tablet (4 mg total) by mouth one time only at 6 PM.   warfarin 5 MG tablet Commonly known as:  COUMADIN TAKE 1 TABLET EVERY DAY       Allergies:  Allergies  Allergen Reactions  . Cefdinir Nausea Only    hypersensitivity to smell  . Doxycycline     Sun sensitivity  . Prednisone   . Sertraline Other (See Comments)    Hallucinations     Family History: Family History  Problem Relation Age of Onset  . Cancer Mother     secondary to cancinoma of the jaw from her dipping snuff.  . Heart attack Father   . CAD Father   . Hypertension Sister   . Diabetes Son     Type I diabetes  . Prostate cancer Neg Hx   . Kidney cancer Neg Hx     Social History:  reports that he quit smoking about 45 years ago. He has never used smokeless tobacco. He reports that he drinks alcohol. He reports that he does not use drugs.  ROS: UROLOGY Frequent Urination?: No Hard to postpone urination?: No Burning/pain with urination?: Yes Get up at night to urinate?: No Leakage of urine?: No Urine stream starts and stops?:  No Trouble  starting stream?: No Do you have to strain to urinate?: No Blood in urine?: No Urinary tract infection?: No Sexually transmitted disease?: No Injury to kidneys or bladder?: No Painful intercourse?: No Weak stream?: No Erection problems?: No Penile pain?: No  Gastrointestinal Nausea?: No Vomiting?: No Indigestion/heartburn?: No Diarrhea?: No Constipation?: No  Constitutional Fever: No Night sweats?: No Weight loss?: No Fatigue?: No  Skin Skin rash/lesions?: No Itching?: No  Eyes Blurred vision?: No Double vision?: No  Ears/Nose/Throat Sore throat?: No Sinus problems?: No  Hematologic/Lymphatic Swollen glands?: No Easy bruising?: No  Cardiovascular Leg swelling?: No Chest pain?: No  Respiratory Cough?: No Shortness of breath?: No  Endocrine Excessive thirst?: No  Musculoskeletal Back pain?: No Joint pain?: No  Neurological Headaches?: No Dizziness?: No  Psychologic Depression?: No Anxiety?: No  Physical Exam: BP (!) 148/75   Pulse 62   Ht '5\' 9"'$  (1.753 m)   Wt 212 lb (96.2 kg)   BMI 31.31 kg/m   Constitutional:  Alert and oriented, No acute distress. HEENT: [place] AT, moist mucus membranes.  Trachea midline, no masses. Cardiovascular: No clubbing, cyanosis, or edema. Respiratory: Normal respiratory effort, no increased work of breathing. GI: Abdomen is soft, nontender, nondistended, no abdominal masses GU: No CVA tenderness.  Skin: No rashes, bruises or suspicious lesions. Neurologic: Grossly intact, no focal deficits, moving all 4 extremities. Psychiatric: Normal mood and affect.  Laboratory Data: Lab Results  Component Value Date   WBC 6.3 07/28/2016   HGB 14.6 04/13/2016   HCT 40.4 07/28/2016   MCV 94 07/28/2016   PLT 176 07/28/2016    Lab Results  Component Value Date   CREATININE 1.00 07/28/2016    Urinalysis Results for orders placed or performed in visit on 10/22/16  Microscopic Examination  Result Value Ref Range    WBC, UA 0-5 0 - 5 /hpf   RBC, UA 3-10 (A) 0 - 2 /hpf   Epithelial Cells (non renal) 0-10 0 - 10 /hpf   Bacteria, UA None seen None seen/Few  Urinalysis, Complete  Result Value Ref Range   Specific Gravity, UA 1.015 1.005 - 1.030   pH, UA 7.5 5.0 - 7.5   Color, UA Yellow Yellow   Appearance Ur Clear Clear   Leukocytes, UA Negative Negative   Protein, UA Negative Negative/Trace   Glucose, UA Negative Negative   Ketones, UA Negative Negative   RBC, UA Trace (A) Negative   Bilirubin, UA Negative Negative   Urobilinogen, Ur 1.0 0.2 - 1.0 mg/dL   Nitrite, UA Negative Negative   Microscopic Examination See below:   BLADDER SCAN AMB NON-IMAGING  Result Value Ref Range   Scan Result 196lb    UA reviewed 3-10 RBC/ HPF, otherwise negative  Assessment & Plan:   1. BPH with obstruction/lower urinary tract symptoms/ incomplete bladder emptying Improving on dual therapy with finasteride and Cardura Discussed outlet procedure for refractory symptoms, currently not interested PVR today still mildly elevated, we'll continue to follow No significant sequela of incomplete emptying including no recurrent infections, stones, or overt retention We'll continue to follow closely - Urinalysis, Complete - BLADDER SCAN AMB NON-IMAGING - PSA  2. History of elevated PSA Check PSA today came history of worsening urinary symptoms- known history of PSA fluctuation up to 13 in the past Stable, will defer any further PSA screening given age and comorbidities  3. Erectile dysfunction, unspecified erectile dysfunction type Previously scussed alternative options for treatment of refractory ED, not interested at this  time  4. Microsocpic hematuria/ dysuria Repeat UA next visit Urine cytology today given dysuria and microscopic came We'll discuss whether or not to pursue microscopic hematuria given his age and comorbidities if persistent next visit   Return in about 4 months (around 02/19/2017) for PVR,  IPSS.  Hollice Espy, MD  [facility] [address] [place],  [address] [phone]

## 2016-10-22 NOTE — Telephone Encounter (Signed)
Called Pt to schedule AWV with [facility] - knb °

## 2016-10-29 NOTE — Telephone Encounter (Signed)
2nd call - knb

## 2016-11-03 ENCOUNTER — Other Ambulatory Visit: Payer: Self-pay | Admitting: Family Medicine

## 2016-11-04 ENCOUNTER — Other Ambulatory Visit: Payer: Self-pay | Admitting: Urology

## 2016-11-05 ENCOUNTER — Ambulatory Visit (INDEPENDENT_AMBULATORY_CARE_PROVIDER_SITE_OTHER): Payer: Medicare HMO

## 2016-11-05 DIAGNOSIS — I482 Chronic atrial fibrillation, unspecified: Secondary | ICD-10-CM

## 2016-11-05 LAB — POCT INR
INR: 2.4
PT: 29.2

## 2016-11-05 NOTE — Patient Instructions (Signed)
Anticoagulation Dose Instructions as of 11/05/2016      Dorene Grebe Tue Wed Thu Fri Sat   New Dose 4 mg 5 mg 4 mg 5 mg 4 mg 5 mg 4 mg    Description    Alternate 4 mg and 5 mg. Return in 4 weeks

## 2016-11-25 DIAGNOSIS — I420 Dilated cardiomyopathy: Secondary | ICD-10-CM | POA: Diagnosis not present

## 2016-11-25 DIAGNOSIS — E782 Mixed hyperlipidemia: Secondary | ICD-10-CM | POA: Diagnosis not present

## 2016-11-25 DIAGNOSIS — I10 Essential (primary) hypertension: Secondary | ICD-10-CM | POA: Diagnosis not present

## 2016-11-25 DIAGNOSIS — I482 Chronic atrial fibrillation: Secondary | ICD-10-CM | POA: Diagnosis not present

## 2016-12-03 ENCOUNTER — Ambulatory Visit (INDEPENDENT_AMBULATORY_CARE_PROVIDER_SITE_OTHER): Payer: Medicare HMO

## 2016-12-03 ENCOUNTER — Other Ambulatory Visit: Payer: Self-pay | Admitting: Family Medicine

## 2016-12-03 DIAGNOSIS — I482 Chronic atrial fibrillation, unspecified: Secondary | ICD-10-CM

## 2016-12-03 LAB — POCT INR
INR: 2.1
PT: 24.8

## 2016-12-03 NOTE — Progress Notes (Signed)
Anticoagulation Dose Instructions as of 12/03/2016      Casey Gallegos Tue Wed Thu Fri Sat   New Dose 4 mg 5 mg 4 mg 5 mg 4 mg 5 mg 4 mg    Description    Alternate 4 mg and 5 mg. Return in 4 weeks

## 2016-12-31 ENCOUNTER — Ambulatory Visit (INDEPENDENT_AMBULATORY_CARE_PROVIDER_SITE_OTHER): Payer: Medicare HMO

## 2016-12-31 DIAGNOSIS — I482 Chronic atrial fibrillation, unspecified: Secondary | ICD-10-CM

## 2016-12-31 LAB — POCT INR
INR: 2.7
PT: 33

## 2016-12-31 NOTE — Patient Instructions (Signed)
Anticoagulation Dose Instructions as of 12/31/2016      Casey Gallegos Tue Wed Thu Fri Sat   New Dose 4 mg 5 mg 4 mg 5 mg 4 mg 5 mg 4 mg    Description    Alternate 4 mg and 5 mg. Recheck at your FU on 02/02/2017.

## 2017-01-13 ENCOUNTER — Ambulatory Visit (INDEPENDENT_AMBULATORY_CARE_PROVIDER_SITE_OTHER): Payer: Medicare HMO

## 2017-01-13 VITALS — BP 137/62 | HR 72 | Temp 97.5°F | Ht 69.0 in | Wt 212.8 lb

## 2017-01-13 DIAGNOSIS — Z0000 Encounter for general adult medical examination without abnormal findings: Secondary | ICD-10-CM | POA: Diagnosis not present

## 2017-01-13 NOTE — Progress Notes (Signed)
Subjective:   Casey Gallegos is a 82 y.o. male who presents for Medicare Annual/Subsequent preventive examination.  Review of Systems:  N/A  Cardiac Risk Factors include: advanced age (>20mn, >>110women);obesity (BMI >30kg/m2);dyslipidemia;hypertension;male gender     Objective:    Vitals: BP 137/62 (BP Location: Right Arm)   Pulse 72   Temp 97.5 F (36.4 C) (Oral)   Ht '5\' 9"'$  (1.753 m)   Wt 212 lb 12.8 oz (96.5 kg)   BMI 31.43 kg/m   Body mass index is 31.43 kg/m.  Tobacco History  Smoking Status  . Former Smoker  . Quit date: 08/26/1971  Smokeless Tobacco  . Never Used    Comment: Smoked for about 20 years.     Counseling given: Not Answered   Past Medical History:  Diagnosis Date  . [place]   . Anemia   . DVT (deep venous thrombosis) (H[facility])    Monitor with PT/INR. Target 2.0 - 3.0   . Elevated PSA   . H/O degenerative disc disease   . Hypertension   . Lipomatosis   . Night sweats   . Obesity    Past Surgical History:  Procedure Laterality Date  . Bilateral Radical Keratotomy Bilateral 1997  . COLONOSCOPY WITH PROPOFOL N/A 01/11/2016   Procedure: COLONOSCOPY WITH PROPOFOL;  Surgeon: RManya Silvas MD;  [facility];  Service: Endoscopy;  Laterality: N/A; repeat 3 years, tubular adenoma  . deep vein thrombosis    . DENTAL SURGERY     Patient had implants  . GUM SURGERY    . L4 - S1 decompression Left    Left-sided  . PROSTATE BIOPSY    . TONSILLECTOMY AND ADENOIDECTOMY  age 82  Family History  Problem Relation Age of Onset  . Cancer Mother        secondary to cancinoma of the jaw from her dipping snuff.  . Heart attack Father   . CAD Father   . Hypertension Sister   . Diabetes Son        Type I diabetes  . Prostate cancer Neg Hx   . Kidney cancer Neg Hx    History  Sexual Activity  . Sexual activity: Not on file    Outpatient Encounter Prescriptions as of 01/13/2017  Medication Sig  . amLODipine (NORVASC) 5 MG tablet TAKE  1 TABLET EVERY DAY  . benazepril (LOTENSIN) 40 MG tablet TAKE 1 TABLET EVERY DAY  . carvedilol (COREG) 6.25 MG tablet TAKE 1 TABLET TWICE DAILY  . doxazosin (CARDURA) 8 MG tablet TAKE 1 TABLET EVERY DAY  . finasteride (PROSCAR) 5 MG tablet Take 1 tablet (5 mg total) by mouth daily.  .Marland Kitchengabapentin (NEURONTIN) 300 MG capsule Take 1 capsule (300 mg total) by mouth 3 (three) times daily.  . MULTIPLE VITAMIN PO Take by mouth.  . oxyCODONE-acetaminophen (ROXICET) 5-325 MG tablet Take 1 tablet by mouth every 6 (six) hours as needed for severe pain.  .Marland Kitchentriamterene-hydrochlorothiazide (MAXZIDE-25) 37.5-25 MG tablet TAKE 1 TABLET EVERY DAY  . warfarin (COUMADIN) 4 MG tablet Take 1 tablet (4 mg total) by mouth one time only at 6 PM. (Patient taking differently: Take 4 mg by mouth. )  . warfarin (COUMADIN) 5 MG tablet TAKE 1 TABLET EVERY DAY (Patient taking differently: TAKE 1 TABLET EVERY other DAY, alternates with 4 mg)  . ALPRAZolam (XANAX) 0.25 MG tablet Take 1 tablet (0.25 mg total) by mouth every 6 (six) hours as needed for anxiety. (Patient  not taking: Reported on 01/13/2017)  . sildenafil (VIAGRA) 100 MG tablet Take by mouth.  . warfarin (COUMADIN) 1 MG tablet Take by mouth.   No facility-administered encounter medications on file as of 01/13/2017.     Activities of Daily Living In your present state of health, do you have any difficulty performing the following activities: 01/13/2017  Hearing? Y  Vision? Y  Difficulty concentrating or making decisions? N  Walking or climbing stairs? N  Dressing or bathing? N  Doing errands, shopping? N  Preparing Food and eating ? N  Using the Toilet? N  In the past six months, have you accidently leaked urine? N  Do you have problems with loss of bowel control? N  Managing your Medications? N  Managing your Finances? N  Housekeeping or managing your Housekeeping? N  Some recent data might be hidden    Patient Care Team: Jerrol Banana., MD as  PCP - General (Family Medicine) Hollice Espy, MD as Consulting Physician (Urology) Corey Skains, MD as Consulting Physician (Cardiology) Leandrew Koyanagi, MD as Referring Physician (Ophthalmology) Barbette Merino, NP as Nurse Practitioner (Nurse Practitioner)   Assessment:     Exercise Activities and Dietary recommendations Current Exercise Habits: Structured exercise class (gardening), Type of exercise: Other - see comments;strength training/weights;stretching (bicycle), Time (Minutes): 60, Frequency (Times/Week): 5, Weekly Exercise (Minutes/Week): 300, Intensity: Moderate  Goals    . Increase water intake      Fall Risk Fall Risk  01/13/2017 04/15/2016 10/18/2015  Falls in the past year? No No No   Depression Screen PHQ 2/9 Scores 01/13/2017 04/15/2016 10/18/2015  PHQ - 2 Score 2 1 0  PHQ- 9 Score 5 4 -    Cognitive Function MMSE - Mini Mental State Exam 04/15/2016  Orientation to time 4  Orientation to Place 5  Registration 3  Attention/ Calculation 5  Recall 1  Language- name 2 objects 2  Language- repeat 1  Language- follow 3 step command 3  Language- read & follow direction 1  Write a sentence 1  Copy design 1  Total score 27     6CIT Screen 01/13/2017  What Year? 0 points  What month? 0 points  What time? 0 points  Count back from 20 0 points  Months in reverse 0 points  Repeat phrase 2 points  Total Score 2    Immunization History  Administered Date(s) Administered  . Influenza, High Dose Seasonal PF 05/11/2015, 05/21/2016  . Pneumococcal Conjugate-13 01/09/2014  . Pneumococcal Polysaccharide-23 08/08/2004  . Td 10/25/2008   Screening Tests Health Maintenance  Topic Date Due  . INFLUENZA VACCINE  03/25/2017  . TETANUS/TDAP  10/26/2018  . PNA vac Low Risk Adult  Completed      Plan:  I have personally reviewed and addressed the Medicare Annual Wellness questionnaire and have noted the following in the patient's chart:  A. Medical and  social history B. Use of alcohol, tobacco or illicit drugs  C. Current medications and supplements D. Functional ability and status E.  Nutritional status F.  Physical activity G. Advance directives H. List of other physicians I.  Hospitalizations, surgeries, and ER visits in previous 12 months J.  [place] such as hearing and vision if needed, cognitive and depression L. Referrals and appointments - none  In addition, I have reviewed and discussed with patient certain preventive protocols, quality metrics, and best practice recommendations. A written personalized care plan for preventive services as well as general  preventive health recommendations were provided to patient.  See attached scanned questionnaire for additional information.   Signed,  Fabio Neighbors, LPN Nurse Health Advisor   MD Recommendations: None. I have reviewed the health advisors note, was  available for consultation and I agree with documentation and plan. Miguel Aschoff MD [facility]

## 2017-01-13 NOTE — Patient Instructions (Signed)
Casey Gallegos , Thank you for taking time to come for your Medicare Wellness Visit. I appreciate your ongoing commitment to your health goals. Please review the following plan we discussed and let me know if I can assist you in the future.   Screening recommendations/referrals: Colonoscopy: completed 10/22/16 Recommended yearly ophthalmology/optometry visit for glaucoma screening and checkup Recommended yearly dental visit for hygiene and checkup  Vaccinations: Influenza vaccine: up to date, due 04/2017 Pneumococcal vaccine: completed series Tdap vaccine: completed 10/25/08, due 10/2018 Shingles vaccine: declined   Advanced directives: Please bring a copy of your POA (Power of [place]) and/or Living Will to your next appointment.   Conditions/risks identified: Recommend increasing water intake to 5-6 glasses a day.  Next appointment: 02/02/17 @ 9:00 AM  Preventive Care 65 Years and Older, Male Preventive care refers to lifestyle choices and visits with your health care provider that can promote health and wellness. What does preventive care include?  A yearly physical exam. This is also called an annual well check.  Dental exams once or twice a year.  Routine eye exams. Ask your health care provider how often you should have your eyes checked.  Personal lifestyle choices, including:  Daily care of your teeth and gums.  Regular physical activity.  Eating a healthy diet.  Avoiding tobacco and drug use.  Limiting alcohol use.  Practicing safe sex.  Taking low doses of aspirin every day.  Taking vitamin and mineral supplements as recommended by your health care provider. What happens during an annual well check? The services and screenings done by your health care provider during your annual well check will depend on your age, overall health, lifestyle risk factors, and family history of disease. Counseling  Your health care provider may ask you questions about  your:  Alcohol use.  Tobacco use.  Drug use.  Emotional well-being.  Home and relationship well-being.  Sexual activity.  Eating habits.  History of falls.  Memory and ability to understand (cognition).  Work and work Statistician. Screening  You may have the following tests or measurements:  Height, weight, and BMI.  Blood pressure.  Lipid and cholesterol levels. These may be checked every 5 years, or more frequently if you are over 80 years old.  Skin check.  Lung cancer screening. You may have this screening every year starting at age 80 if you have a 30-pack-year history of smoking and currently smoke or have quit within the past 15 years.  Fecal occult blood test (FOBT) of the stool. You may have this test every year starting at age 80.  Flexible sigmoidoscopy or colonoscopy. You may have a sigmoidoscopy every 5 years or a colonoscopy every 10 years starting at age 80.  Prostate cancer screening. Recommendations will vary depending on your family history and other risks.  Hepatitis C blood test.  Hepatitis B blood test.  Sexually transmitted disease (STD) testing.  Diabetes screening. This is done by checking your blood sugar (glucose) after you have not eaten for a while (fasting). You may have this done every 1-3 years.  Abdominal aortic aneurysm (AAA) screening. You may need this if you are a current or former smoker.  Osteoporosis. You may be screened starting at age 18 if you are at high risk. Talk with your health care provider about your test results, treatment options, and if necessary, the need for more tests. Vaccines  Your health care provider may recommend certain vaccines, such as:  Influenza vaccine. This is recommended every year.  Tetanus, diphtheria, and acellular pertussis (Tdap, Td) vaccine. You may need a Td booster every 10 years.  Zoster vaccine. You may need this after age 3.  Pneumococcal 13-valent conjugate (PCV13) vaccine.  One dose is recommended after age 80.  Pneumococcal polysaccharide (PPSV23) vaccine. One dose is recommended after age 80. Talk to your health care provider about which screenings and vaccines you need and how often you need them. This information is not intended to replace advice given to you by your health care provider. Make sure you discuss any questions you have with your health care provider. Document Released: 09/07/2015 Document Revised: 04/30/2016 Document Reviewed: 06/12/2015 Elsevier Interactive Patient Education  2017 [place] Prevention in the Home Falls can cause injuries. They can happen to people of all ages. There are many things you can do to make your home safe and to help prevent falls. What can I do on the outside of my home?  Regularly fix the edges of walkways and driveways and fix any cracks.  Remove anything that might make you trip as you walk through a door, such as a raised step or threshold.  Trim any bushes or trees on the path to your home.  Use bright outdoor lighting.  Clear any walking paths of anything that might make someone trip, such as rocks or tools.  Regularly check to see if handrails are loose or broken. Make sure that both sides of any steps have handrails.  Any raised decks and porches should have guardrails on the edges.  Have any leaves, snow, or ice cleared regularly.  Use sand or salt on walking paths during winter.  Clean up any spills in your garage right away. This includes oil or grease spills. What can I do in the bathroom?  Use night lights.  Install grab bars by the toilet and in the tub and shower. Do not use towel bars as grab bars.  Use non-skid mats or decals in the tub or shower.  If you need to sit down in the shower, use a plastic, non-slip stool.  Keep the floor dry. Clean up any water that spills on the floor as soon as it happens.  Remove soap buildup in the tub or shower regularly.  Attach bath  mats securely with double-sided non-slip rug tape.  Do not have throw rugs and other things on the floor that can make you trip. What can I do in the bedroom?  Use night lights.  Make sure that you have a light by your bed that is easy to reach.  Do not use any sheets or blankets that are too big for your bed. They should not hang down onto the floor.  Have a firm chair that has side arms. You can use this for support while you get dressed.  Do not have throw rugs and other things on the floor that can make you trip. What can I do in the kitchen?  Clean up any spills right away.  Avoid walking on wet floors.  Keep items that you use a lot in easy-to-reach places.  If you need to reach something above you, use a strong step stool that has a grab bar.  Keep electrical cords out of the way.  Do not use floor polish or wax that makes floors slippery. If you must use wax, use non-skid floor wax.  Do not have throw rugs and other things on the floor that can make you trip. What can I do  with my stairs?  Do not leave any items on the stairs.  Make sure that there are handrails on both sides of the stairs and use them. Fix handrails that are broken or loose. Make sure that handrails are as long as the stairways.  Check any carpeting to make sure that it is firmly attached to the stairs. Fix any carpet that is loose or worn.  Avoid having throw rugs at the top or bottom of the stairs. If you do have throw rugs, attach them to the floor with carpet tape.  Make sure that you have a light switch at the top of the stairs and the bottom of the stairs. If you do not have them, ask someone to add them for you. What else can I do to help prevent falls?  Wear shoes that:  Do not have high heels.  Have rubber bottoms.  Are comfortable and fit you well.  Are closed at the toe. Do not wear sandals.  If you use a stepladder:  Make sure that it is fully opened. Do not climb a closed  stepladder.  Make sure that both sides of the stepladder are locked into place.  Ask someone to hold it for you, if possible.  Clearly mark and make sure that you can see:  Any grab bars or handrails.  First and last steps.  Where the edge of each step is.  Use tools that help you move around (mobility aids) if they are needed. These include:  Canes.  Walkers.  Scooters.  Crutches.  Turn on the lights when you go into a dark area. Replace any light bulbs as soon as they burn out.  Set up your furniture so you have a clear path. Avoid moving your furniture around.  If any of your floors are uneven, fix them.  If there are any pets around you, be aware of where they are.  Review your medicines with your doctor. Some medicines can make you feel dizzy. This can increase your chance of falling. Ask your doctor what other things that you can do to help prevent falls. This information is not intended to replace advice given to you by your health care provider. Make sure you discuss any questions you have with your health care provider. Document Released: 06/07/2009 Document Revised: 01/17/2016 Document Reviewed: 09/15/2014 Elsevier Interactive Patient Education  2017 [employer].

## 2017-01-15 ENCOUNTER — Other Ambulatory Visit: Payer: Self-pay | Admitting: Family Medicine

## 2017-01-15 DIAGNOSIS — I480 Paroxysmal atrial fibrillation: Secondary | ICD-10-CM

## 2017-02-02 ENCOUNTER — Ambulatory Visit (INDEPENDENT_AMBULATORY_CARE_PROVIDER_SITE_OTHER): Payer: Medicare HMO | Admitting: Family Medicine

## 2017-02-02 ENCOUNTER — Encounter: Payer: Self-pay | Admitting: Family Medicine

## 2017-02-02 VITALS — BP 136/72 | HR 68 | Temp 97.8°F | Resp 16 | Ht 69.0 in | Wt 203.0 lb

## 2017-02-02 DIAGNOSIS — I482 Chronic atrial fibrillation, unspecified: Secondary | ICD-10-CM

## 2017-02-02 DIAGNOSIS — M5136 Other intervertebral disc degeneration, lumbar region: Secondary | ICD-10-CM | POA: Diagnosis not present

## 2017-02-02 DIAGNOSIS — I10 Essential (primary) hypertension: Secondary | ICD-10-CM

## 2017-02-02 LAB — POCT INR
ESTIMATED AVERAGE GLUCOSE: 26.8
INR: 2.2

## 2017-02-02 MED ORDER — OXYCODONE-ACETAMINOPHEN 5-325 MG PO TABS: 1.0000 | ORAL_TABLET | Freq: Four times a day (QID) | ORAL | 0 refills | Status: DC | PRN

## 2017-02-02 NOTE — Progress Notes (Signed)
[name] Male    DOB: Oct 24, 1934   82 y.o.   MRN: [id number] Visit Date: 02/02/2017  Today's Provider: [name], MD   Chief Complaint  Patient presents with  . Hypertension  . Atrial Fibrillation   Subjective:    HPI  Hypertension, follow-up:  BP Readings from Last 3 Encounters:  02/02/17 136/72  01/13/17 137/62  10/22/16 (!) 148/75    He was last seen for hypertension 6 months ago.  BP at that visit was 148/72. Management since that visit includes no changes. He reports good compliance with treatment. He is not having side effects.  He is exercising. He is adherent to low salt diet.   Outside blood pressures are checked daily. He is experiencing none.  Patient denies exertional chest pressure/discomfort, lower extremity edema and palpitations.     Weight trend: stable Wt Readings from Last 3 Encounters:  02/02/17 203 lb (92.1 kg)  01/13/17 212 lb 12.8 oz (96.5 kg)  10/22/16 212 lb (96.2 kg)    Current diet: well balanced   Atrial Fibrillation, follow up: Patient was last seen 1 month ago. No changes were made in his Warfarin dose. He is currently alternating 4mg  and 5mg , and denies any side effects.    Anticoagulation Warfarin Dose Instructions as of 02/02/2017      Dorene Grebe Tue Wed Thu Fri Sat   New Dose 4 mg 5 mg 4 mg 5 mg 4 mg 5 mg 4 mg    Description    Alternate 4 mg and 5 mg.      Lab Results  Component Value Date   INR 2.2 02/02/2017   INR 2.7 12/31/2016   INR 2.1 12/03/2016           Allergies  Allergen Reactions  . Cefdinir Nausea Only    hypersensitivity to smell  . Doxycycline     Sun sensitivity  . Prednisone   . Sertraline Other (See Comments)    Hallucinations      Current Outpatient Prescriptions:  .  amLODipine (NORVASC) 5 MG tablet, TAKE 1 TABLET EVERY DAY, Disp: 90 tablet, Rfl: 3 .  benazepril (LOTENSIN) 40 MG tablet, TAKE 1 TABLET EVERY DAY, Disp: 90 tablet, Rfl: 3 .  carvedilol  (COREG) 6.25 MG tablet, TAKE 1 TABLET TWICE DAILY, Disp: 180 tablet, Rfl: 3 .  doxazosin (CARDURA) 8 MG tablet, TAKE 1 TABLET EVERY DAY, Disp: 90 tablet, Rfl: 3 .  finasteride (PROSCAR) 5 MG tablet, Take 1 tablet (5 mg total) by mouth daily., Disp: 90 tablet, Rfl: 3 .  gabapentin (NEURONTIN) 300 MG capsule, Take 1 capsule (300 mg total) by mouth 3 (three) times daily., Disp: 270 capsule, Rfl: 3 .  MULTIPLE VITAMIN PO, Take by mouth., Disp: , Rfl:  .  oxyCODONE-acetaminophen (ROXICET) 5-325 MG tablet, Take 1 tablet by mouth every 6 (six) hours as needed for severe pain., Disp: 100 tablet, Rfl: 0 .  sildenafil (VIAGRA) 100 MG tablet, Take by mouth., Disp: , Rfl:  .  triamterene-hydrochlorothiazide (MAXZIDE-25) 37.5-25 MG tablet, TAKE 1 TABLET EVERY DAY, Disp: 90 tablet, Rfl: 3 .  warfarin (COUMADIN) 1 MG tablet, Take by mouth., Disp: , Rfl:  .  warfarin (COUMADIN) 4 MG tablet, TAKE 1 TABLET ONE TIME DAILY  AT  6PM, Disp: 90 tablet, Rfl: 3 .  warfarin (COUMADIN) 5 MG tablet, TAKE 1 TABLET EVERY DAY, Disp: 90 tablet, Rfl: 3 .  ALPRAZolam (XANAX) 0.25 MG tablet, Take  1 tablet (0.25 mg total) by mouth every 6 (six) hours as needed for anxiety. (Patient not taking: Reported on 01/13/2017), Disp: 55 tablet, Rfl: 2  Review of Systems  Constitutional: Negative.   Eyes: Negative.   Respiratory: Negative.   Cardiovascular: Negative.   Endocrine: Negative.   Musculoskeletal: Positive for back pain.  Allergic/Immunologic: Negative.   Neurological: Negative.   Psychiatric/Behavioral: Negative.     Social History  Substance Use Topics  . Smoking status: Former Smoker    Quit date: 08/26/1971  . Smokeless tobacco: Never Used     Comment: Smoked for about 20 years.  . Alcohol use Yes     Comment: Occasional   Objective:   BP 136/72 (BP Location: Right Arm, Patient Position: Sitting, Cuff Size: Normal)   Pulse 68   Temp 97.8 F (36.6 C)   Resp 16   Ht 5\' 9"  (1.753 m)   Wt 203 lb (92.1 kg)   SpO2  99%   BMI 29.98 kg/m  Vitals:   02/02/17 1036  BP: 136/72  Pulse: 68  Resp: 16  Temp: 97.8 F (36.6 C)  SpO2: 99%  Weight: 203 lb (92.1 kg)  Height: 5\' 9"  (1.753 m)     Physical Exam  Constitutional: He is oriented to person, place, and time. He appears well-developed and well-nourished.  HENT:  Head: Normocephalic and atraumatic.  Right Ear: External ear normal.  Left Ear: External ear normal.  Nose: Nose normal.  Eyes: Conjunctivae are normal. No scleral icterus.  Neck: No thyromegaly present.  Cardiovascular: Normal rate, regular rhythm and normal heart sounds.   Pulmonary/Chest: Effort normal and breath sounds normal.  Abdominal: Soft.  Musculoskeletal: Normal range of motion. He exhibits no edema.  Neurological: He is alert and oriented to person, place, and time.  Skin: Skin is warm and dry.  Psychiatric: He has a normal mood and affect. His behavior is normal. Judgment and thought content normal.        Assessment & Plan:     1. Essential hypertension   2. Chronic atrial fibrillation (HCC)  - POCT INR  3. DDD (degenerative disc disease), lumbar Chronic back pain. - oxyCODONE-acetaminophen (ROXICET) 5-325 MG tablet; Take 1 tablet by mouth every 6 (six) hours as needed for severe pain.  Dispense: 50 tablet; Refill: 0       Madysyn Hanken Cranford Mon, MD  [facility]

## 2017-02-02 NOTE — Patient Instructions (Addendum)
Be sure to stay hydrated during the hot summer months.  Ok to take 1/2 tablet of the triamterene/HCTZ when BP drops too low.

## 2017-02-19 ENCOUNTER — Encounter: Payer: Self-pay | Admitting: Urology

## 2017-02-19 ENCOUNTER — Ambulatory Visit: Payer: Medicare HMO | Admitting: Urology

## 2017-02-19 VITALS — BP 119/68 | HR 91 | Ht 69.0 in | Wt 200.0 lb

## 2017-02-19 DIAGNOSIS — R3129 Other microscopic hematuria: Secondary | ICD-10-CM

## 2017-02-19 DIAGNOSIS — N400 Enlarged prostate without lower urinary tract symptoms: Secondary | ICD-10-CM | POA: Diagnosis not present

## 2017-02-19 LAB — URINALYSIS, COMPLETE
BILIRUBIN UA: NEGATIVE
GLUCOSE, UA: NEGATIVE
Ketones, UA: NEGATIVE
Leukocytes, UA: NEGATIVE
Nitrite, UA: NEGATIVE
PH UA: 6.5 (ref 5.0–7.5)
PROTEIN UA: NEGATIVE
RBC, UA: NEGATIVE
SPEC GRAV UA: 1.02 (ref 1.005–1.030)
Urobilinogen, Ur: 4 mg/dL — ABNORMAL HIGH (ref 0.2–1.0)

## 2017-02-19 LAB — BLADDER SCAN AMB NON-IMAGING

## 2017-02-19 NOTE — Progress Notes (Signed)
02/19/2017 9:35 AM   [name] June 09, 1935 [id number]  Referring provider: Jerrol Banana., MD [address] [place], [place] [address]  Chief Complaint  Patient presents with  . Benign Prostatic Hypertrophy    4 months    HPI: 82 year old male with BPH, incomplete bladder emptying, history of elevated PSA, and microscopic hematuria.    BPH with LUTS  TRUS vol 72 cc in 2012.  IPSS today as below, Overall quite pleased and improved.  Today, he is doing quite well. At last visit, he was having dysuria but this is since completely resolved. He no longer gets up at night for the most part. His stream is improved. He feels he is emptying his bladder better. No significant urgency or frequency.  PVR today 9 cc, down from great than 200 cc in the past.     He is on cardura 8 mg fo BPH and finasteride 5 mg started 06/2016.   ED  He does have baseline ED.  He has used used Viagra in the past (100 mg) but did not help much.  Cialsis did work best.  He is not intesretd in treatment at time.    History of elevated PSA Personal history of elevated PSA status post negative prostate biopsy in 05/2011 by Dr. Bernardo Heater.    Review of previous records indicate that his PSA was as high as 11.6 in May 2011. He was treated with him. Course of antibiotics and decreased his PSA to 4.5. His history of fluctuating PSA and has been as high as 13.    Most recent PSA 12.1 on 06/2016.  Rectal exam on 06/2016 shows 50+ cc prostate, otherwise no nodules.   Microscopic hematuria Incidental microscopic hematuria last visit 09/1016 (3-10 RBC) with associated dysuria which has resolved.  Urine cytology 09/1016 negative.    UA today without blood.    He is on chronic Coumadin.  Smoked 1 ppd x 20 years, quit 23 yeear       [place] Name 02/19/17 0900         International Prostate Symptom Score   How often have you had the sensation of not emptying your bladder? Less than 1  in 5     How often have you had to urinate less than every two hours? Less than half the time     How often have you found you stopped and started again several times when you urinated? Less than 1 in 5 times     How often have you found it difficult to postpone urination? Less than 1 in 5 times     How often have you had a weak urinary stream? Less than 1 in 5 times     How often have you had to strain to start urination? Less than 1 in 5 times     How many times did you typically get up at night to urinate? 1 Time     Total IPSS Score 8       Quality of Life due to urinary symptoms   If you were to spend the rest of your life with your urinary condition just the way it is now how would you feel about that? Pleased        Score:  1-7 Mild 8-19 Moderate 20-35 Severe   PMH: Past Medical History:  Diagnosis Date  . A-fib ([place])   . Anemia   . DVT (deep venous thrombosis) ([place])  Monitor with PT/INR. Target 2.0 - 3.0   . Elevated PSA   . H/O degenerative disc disease   . Hypertension   . Lipomatosis   . Night sweats   . Obesity     Surgical History: Past Surgical History:  Procedure Laterality Date  . Bilateral Radical Keratotomy Bilateral 1997  . COLONOSCOPY WITH PROPOFOL N/A 01/11/2016   Procedure: COLONOSCOPY WITH PROPOFOL;  Surgeon: Manya Silvas, MD;  Location[facility] ENDOSCOPY;  Service: Endoscopy;  Laterality: N/A; repeat 3 years, tubular adenoma  . deep vein thrombosis    . DENTAL SURGERY     Patient had implants  . GUM SURGERY    . L4 - S1 decompression Left    Left-sided  . PROSTATE BIOPSY    . TONSILLECTOMY AND ADENOIDECTOMY  age 60    Home Medications:  Allergies as of 02/19/2017      Reactions   Cefdinir Nausea Only   hypersensitivity to smell   Doxycycline    Sun sensitivity   Prednisone    Sertraline Other (See Comments)   Hallucinations       Medication List       Accurate as of 02/19/17  9:35 AM. Always use your most recent med list.            ALPRAZolam 0.25 MG tablet Commonly known as:  XANAX Take 1 tablet (0.25 mg total) by mouth every 6 (six) hours as needed for anxiety.   amLODipine 5 MG tablet Commonly known as:  NORVASC TAKE 1 TABLET EVERY DAY   benazepril 40 MG tablet Commonly known as:  LOTENSIN TAKE 1 TABLET EVERY DAY   carvedilol 6.25 MG tablet Commonly known as:  COREG TAKE 1 TABLET TWICE DAILY   doxazosin 8 MG tablet Commonly known as:  CARDURA TAKE 1 TABLET EVERY DAY   finasteride 5 MG tablet Commonly known as:  PROSCAR Take 1 tablet (5 mg total) by mouth daily.   gabapentin 300 MG capsule Commonly known as:  NEURONTIN Take 1 capsule (300 mg total) by mouth 3 (three) times daily.   MULTIPLE VITAMIN PO Take by mouth.   oxyCODONE-acetaminophen 5-325 MG tablet Commonly known as:  ROXICET Take 1 tablet by mouth every 6 (six) hours as needed for severe pain.   triamterene-hydrochlorothiazide 37.5-25 MG tablet Commonly known as:  MAXZIDE-25 TAKE 1 TABLET EVERY DAY   VIAGRA 100 MG tablet Generic drug:  sildenafil Take by mouth.   warfarin 1 MG tablet Commonly known as:  COUMADIN Take by mouth.   warfarin 4 MG tablet Commonly known as:  COUMADIN TAKE 1 TABLET ONE TIME DAILY  AT  6PM   warfarin 5 MG tablet Commonly known as:  COUMADIN TAKE 1 TABLET EVERY DAY       Allergies:  Allergies  Allergen Reactions  . Cefdinir Nausea Only    hypersensitivity to smell  . Doxycycline     Sun sensitivity  . Prednisone   . Sertraline Other (See Comments)    Hallucinations     Family History: Family History  Problem Relation Age of Onset  . Cancer Mother        secondary to cancinoma of the jaw from her dipping snuff.  . Heart attack Father   . CAD Father   . Hypertension Sister   . Diabetes Son        Type I diabetes  . Prostate cancer Neg Hx   . Kidney cancer Neg Hx     Social History:  reports that he quit smoking about 45 years ago. He has never used smokeless tobacco.  He reports that he drinks alcohol. He reports that he does not use drugs.  ROS: UROLOGY Frequent Urination?: No Hard to postpone urination?: No Burning/pain with urination?: No Get up at night to urinate?: No Leakage of urine?: No Urine stream starts and stops?: No Trouble starting stream?: No Do you have to strain to urinate?: No Blood in urine?: No Urinary tract infection?: No Sexually transmitted disease?: No Injury to kidneys or bladder?: No Painful intercourse?: No Weak stream?: No Erection problems?: No Penile pain?: No  Gastrointestinal Nausea?: No Vomiting?: No Indigestion/heartburn?: No Diarrhea?: No Constipation?: No  Constitutional Fever: No Night sweats?: No Weight loss?: No Fatigue?: No  Skin Skin rash/lesions?: No Itching?: No  Eyes Blurred vision?: No Double vision?: No  Ears/Nose/Throat Sore throat?: No Sinus problems?: No  Hematologic/Lymphatic Swollen glands?: No Easy bruising?: No  Cardiovascular Leg swelling?: No Chest pain?: No  Respiratory Cough?: No Shortness of breath?: No  Endocrine Excessive thirst?: No  Musculoskeletal Back pain?: No Joint pain?: No  Neurological Headaches?: No Dizziness?: No  Psychologic Depression?: No Anxiety?: No  Physical Exam: BP 119/68   Pulse 91   Ht 5\' 9"  (1.753 m)   Wt 200 lb (90.7 kg)   BMI 29.53 kg/m   Constitutional:  Alert and oriented, No acute distress. HEENT: [place] AT, moist mucus membranes.  Trachea midline, no masses. Cardiovascular: No clubbing, cyanosis, or edema. Respiratory: Normal respiratory effort, no increased work of breathing. GI: Abdomen is soft, nontender, nondistended. GU: No CVA tenderness.  Skin: No rashes, bruises or suspicious lesions. Neurologic: Grossly intact, no focal deficits, moving all 4 extremities. Psychiatric: Normal mood and affect.  Laboratory Data: Lab Results  Component Value Date   WBC 6.3 07/28/2016   HGB 13.2 07/28/2016   HCT 40.4  07/28/2016   MCV 94 07/28/2016   PLT 176 07/28/2016    Lab Results  Component Value Date   CREATININE 1.00 07/28/2016   Urinalysis: UA reviewed today, no evidence of blood   Imaging Results for orders placed or performed in visit on 02/19/17  BLADDER SCAN AMB NON-IMAGING  Result Value Ref Range   Scan Result 59ml     Assessment & Plan:   1. BPH with obstruction/lower urinary tract symptoms/ incomplete bladder emptying Improving on dual therapy with finasteride and Cardura PVR improved today REcheck symptoms in 6 months or sooner if symptomatic - Urinalysis, Complete - BLADDER SCAN AMB NON-IMAGING  2. History of elevated PSA Will defer any further PSA screening given age and comorbidities  3. Erectile dysfunction, unspecified erectile dysfunction type Previously scussed alternative options for treatment of refractory ED, not interested at this time  4. Microsocpic hematuria/ dysuria Incidental isolated episode of microscopic blood at the time of dysuria Urine cytology negative No evidence of blood today on UA We discussed pursuing at minimum cystoscopy for further evaluation, risk and benefits including possibility of undiagnosed bladder cancer After thoughtful lengthy discussion, he would like to defer this at this time and consider at next visit if persistent Declined upper tract imaging as well  Return in about 6 months (around 08/21/2017) for IPSS, PVR, UA, possible cysto if blood.  Hollice Espy, MD  [facility] [address] [place], [place], [place] [address] [phone]

## 2017-03-04 ENCOUNTER — Ambulatory Visit (INDEPENDENT_AMBULATORY_CARE_PROVIDER_SITE_OTHER): Payer: Medicare HMO

## 2017-03-04 DIAGNOSIS — I482 Chronic atrial fibrillation, unspecified: Secondary | ICD-10-CM

## 2017-03-04 LAB — POCT INR
INR: 1.8
PT: 21.1

## 2017-03-04 NOTE — Patient Instructions (Signed)
Anticoagulation Warfarin Dose Instructions as of 03/04/2017      Casey Gallegos Tue Wed Thu Fri Sat   New Dose 5 mg 4 mg 5 mg 5 mg 5 mg 4 mg 5 mg    Description   5 mg q d except 4 mg M and F F/U 2 weeks

## 2017-03-18 ENCOUNTER — Ambulatory Visit (INDEPENDENT_AMBULATORY_CARE_PROVIDER_SITE_OTHER): Payer: Medicare HMO

## 2017-03-18 DIAGNOSIS — I482 Chronic atrial fibrillation, unspecified: Secondary | ICD-10-CM

## 2017-03-18 LAB — POCT INR
INR: 1.8
PT: 22.1

## 2017-03-18 NOTE — Patient Instructions (Signed)
Anticoagulation Warfarin Dose Instructions as of 03/18/2017      Dorene Grebe Tue Wed Thu Fri Sat   New Dose 5 mg 5 mg 5 mg 5 mg 5 mg 5 mg 5 mg    Description   5 mg daily. Recheck 2 weeks.

## 2017-03-22 ENCOUNTER — Other Ambulatory Visit: Payer: Self-pay | Admitting: Family Medicine

## 2017-03-22 ENCOUNTER — Other Ambulatory Visit: Payer: Self-pay | Admitting: Urology

## 2017-04-01 ENCOUNTER — Ambulatory Visit (INDEPENDENT_AMBULATORY_CARE_PROVIDER_SITE_OTHER): Payer: Medicare HMO

## 2017-04-01 DIAGNOSIS — I482 Chronic atrial fibrillation, unspecified: Secondary | ICD-10-CM

## 2017-04-01 LAB — POCT INR
INR: 2.2
Prothrombin Time: 26.9

## 2017-04-01 NOTE — Patient Instructions (Signed)
Anticoagulation Warfarin Dose Instructions as of 04/01/2017      Casey Gallegos Tue Wed Thu Fri Sat   New Dose 5 mg 5 mg 5 mg 5 mg 5 mg 5 mg 5 mg    Description   5 mg daily. Recheck  4weeks.

## 2017-04-29 ENCOUNTER — Ambulatory Visit (INDEPENDENT_AMBULATORY_CARE_PROVIDER_SITE_OTHER): Payer: Medicare HMO

## 2017-04-29 DIAGNOSIS — I482 Chronic atrial fibrillation, unspecified: Secondary | ICD-10-CM

## 2017-04-29 LAB — POCT INR
INR: 2.7
Prothrombin Time: 32.1

## 2017-04-29 NOTE — Patient Instructions (Signed)
Anticoagulation Warfarin Dose Instructions as of 04/29/2017      Casey Gallegos Tue Wed Thu Fri Sat   New Dose 5 mg 5 mg 5 mg 5 mg 5 mg 5 mg 5 mg    Description   Hold 1 dose, return back to 5mg  daily starting tomorrow and return back to office in one month

## 2017-05-27 ENCOUNTER — Ambulatory Visit (INDEPENDENT_AMBULATORY_CARE_PROVIDER_SITE_OTHER): Payer: Medicare HMO

## 2017-05-27 ENCOUNTER — Ambulatory Visit: Payer: Self-pay

## 2017-05-27 DIAGNOSIS — Z23 Encounter for immunization: Secondary | ICD-10-CM

## 2017-05-27 DIAGNOSIS — I482 Chronic atrial fibrillation, unspecified: Secondary | ICD-10-CM

## 2017-05-27 LAB — POCT INR
INR: 2.9
Prothrombin Time: 35.3

## 2017-05-27 NOTE — Patient Instructions (Signed)
Anticoagulation Warfarin Dose Instructions as of 05/27/2017      Casey Gallegos Tue Wed Thu Fri Sat   New Dose 5 mg 5 mg 5 mg 5 mg 5 mg 5 mg 5 mg    Description   Hold one day and return back to 5mg  daily

## 2017-06-03 DIAGNOSIS — E782 Mixed hyperlipidemia: Secondary | ICD-10-CM | POA: Diagnosis not present

## 2017-06-03 DIAGNOSIS — I82409 Acute embolism and thrombosis of unspecified deep veins of unspecified lower extremity: Secondary | ICD-10-CM | POA: Diagnosis not present

## 2017-06-03 DIAGNOSIS — I071 Rheumatic tricuspid insufficiency: Secondary | ICD-10-CM | POA: Diagnosis not present

## 2017-06-03 DIAGNOSIS — I10 Essential (primary) hypertension: Secondary | ICD-10-CM | POA: Diagnosis not present

## 2017-06-03 DIAGNOSIS — I482 Chronic atrial fibrillation: Secondary | ICD-10-CM | POA: Diagnosis not present

## 2017-06-03 DIAGNOSIS — I420 Dilated cardiomyopathy: Secondary | ICD-10-CM | POA: Diagnosis not present

## 2017-06-24 ENCOUNTER — Ambulatory Visit (INDEPENDENT_AMBULATORY_CARE_PROVIDER_SITE_OTHER): Payer: Medicare HMO

## 2017-06-24 DIAGNOSIS — I482 Chronic atrial fibrillation, unspecified: Secondary | ICD-10-CM

## 2017-06-24 LAB — POCT INR
INR: 3.4
PT: 40.8

## 2017-06-24 NOTE — Patient Instructions (Signed)
Anticoagulation Warfarin Dose Instructions as of 06/24/2017      Casey Gallegos Tue Wed Thu Fri Sat   New Dose 4 mg 4 mg 4 mg 4 mg 4 mg 4 mg 4 mg    Description   Dx: Atrial Fibrillation ( I48.2) Current dose: 5mg  QD PT: 40.8 INR:3.4 Today's changes: change to 4mg  daily Recheck: 2 weeks

## 2017-06-26 ENCOUNTER — Ambulatory Visit: Payer: Self-pay

## 2017-07-02 ENCOUNTER — Other Ambulatory Visit: Payer: Self-pay | Admitting: Family Medicine

## 2017-07-07 ENCOUNTER — Other Ambulatory Visit: Payer: Self-pay

## 2017-07-07 DIAGNOSIS — I824Y9 Acute embolism and thrombosis of unspecified deep veins of unspecified proximal lower extremity: Secondary | ICD-10-CM

## 2017-07-08 ENCOUNTER — Ambulatory Visit: Payer: Medicare HMO

## 2017-07-08 DIAGNOSIS — I824Y9 Acute embolism and thrombosis of unspecified deep veins of unspecified proximal lower extremity: Secondary | ICD-10-CM | POA: Diagnosis not present

## 2017-07-09 ENCOUNTER — Telehealth: Payer: Self-pay | Admitting: Family Medicine

## 2017-07-09 LAB — PROTIME-INR
INR: 1.8 — AB
Prothrombin Time: 18.6 s — ABNORMAL HIGH (ref 9.0–11.5)

## 2017-07-09 NOTE — Telephone Encounter (Signed)
Dr Caryn Section I think I sent these results to you, please review-Anastasiya V Hopkins, RMA

## 2017-07-09 NOTE — Telephone Encounter (Signed)
Pt called to see how he needed to take his warfarin (COUMADIN) based on his PT/INR. The results were routed to another provider b/c Dr. Rosanna Randy is out of the office until Monday. Please advise. Thanks TNP

## 2017-07-29 ENCOUNTER — Ambulatory Visit: Payer: Medicare HMO

## 2017-07-29 DIAGNOSIS — I482 Chronic atrial fibrillation, unspecified: Secondary | ICD-10-CM

## 2017-07-29 LAB — POCT INR
INR: 2.7
PT: 32.1

## 2017-08-03 ENCOUNTER — Ambulatory Visit: Payer: Medicare HMO | Admitting: Family Medicine

## 2017-08-26 ENCOUNTER — Encounter: Payer: Self-pay | Admitting: Urology

## 2017-08-26 ENCOUNTER — Ambulatory Visit: Payer: Medicare HMO | Admitting: Urology

## 2017-08-26 VITALS — BP 101/65 | HR 80 | Ht 69.0 in | Wt 200.0 lb

## 2017-08-26 DIAGNOSIS — N401 Enlarged prostate with lower urinary tract symptoms: Secondary | ICD-10-CM | POA: Diagnosis not present

## 2017-08-26 DIAGNOSIS — Z87898 Personal history of other specified conditions: Secondary | ICD-10-CM

## 2017-08-26 DIAGNOSIS — R3129 Other microscopic hematuria: Secondary | ICD-10-CM

## 2017-08-26 DIAGNOSIS — N138 Other obstructive and reflux uropathy: Secondary | ICD-10-CM | POA: Diagnosis not present

## 2017-08-26 DIAGNOSIS — N529 Male erectile dysfunction, unspecified: Secondary | ICD-10-CM | POA: Diagnosis not present

## 2017-08-26 DIAGNOSIS — N400 Enlarged prostate without lower urinary tract symptoms: Secondary | ICD-10-CM

## 2017-08-26 LAB — URINALYSIS, COMPLETE
Bilirubin, UA: NEGATIVE
GLUCOSE, UA: NEGATIVE
KETONES UA: NEGATIVE
Leukocytes, UA: NEGATIVE
NITRITE UA: NEGATIVE
PROTEIN UA: NEGATIVE
RBC, UA: NEGATIVE
SPEC GRAV UA: 1.015 (ref 1.005–1.030)
UUROB: 0.2 mg/dL (ref 0.2–1.0)
pH, UA: 7 (ref 5.0–7.5)

## 2017-08-26 LAB — MICROSCOPIC EXAMINATION
Epithelial Cells (non renal): NONE SEEN /hpf (ref 0–10)
RBC, UA: NONE SEEN /hpf (ref 0–?)
WBC UA: NONE SEEN /HPF (ref 0–?)

## 2017-08-26 LAB — BLADDER SCAN AMB NON-IMAGING

## 2017-08-26 NOTE — Progress Notes (Signed)
08/26/2017 5:32 PM   [name] 1935-04-30 [id number]  Referring provider: Jerrol Banana., MD [address] [place], [place] [address]  Chief Complaint  Patient presents with  . Benign Prostatic Hypertrophy    57month    HPI: 83 year old male with BPH, incomplete bladder emptying, history of elevated PSA, and microscopic hematuria who returns for routine 6 months follow up.  BPH with LUTS TRUS vol 72 cc in 2012. IPSS today as below, 7/1.  Overall pleased. PVR 40 cc today, previously as high as 200 cc. He is on cardura 8 mg fo BPH and finasteride 5 mg started 06/2016.   ED  He does have baseline ED.  He has used used Viagra in the past (100 mg) but did not help much.  Cialsis did work best.  He is not intesretd in treatment at time.    History of elevated PSA Personal history of elevated PSA status post negative prostate biopsy in 05/2011 by Dr. Bernardo Heater.    Review of previous records indicate that his PSA was as high as 11.6 in May 2011. He was treated with him. Course of antibiotics and decreased his PSA to 4.5. His history of fluctuating PSA and has been as high as 13.    Most recent PSA 12.1 on 06/2016.  Rectal exam on 06/2016 shows 50+ cc prostate, otherwise no nodules.   No recent repeat PSA.    Microscopic hematuria Incidental microscopic hematuria visit 09/1016 (3-10 RBC) with associated dysuria which has resolved.  Urine cytology 09/1016 negative.    UA today without blood x multiple times including today again.  He is on chronic Coumadin.  Smoked 1 ppd x 20 years, quit 40 year  IPSS    Row Name 08/26/17 1000         International Prostate Symptom Score   How often have you had the sensation of not emptying your bladder?  Less than 1 in 5     How often have you had to urinate less than every two hours?  Less than 1 in 5 times     How often have you found you stopped and started again several times when you urinated?  Less than 1 in 5  times     How often have you found it difficult to postpone urination?  Less than 1 in 5 times     How often have you had a weak urinary stream?  Less than 1 in 5 times     How often have you had to strain to start urination?  Less than 1 in 5 times     How many times did you typically get up at night to urinate?  1 Time     Total IPSS Score  7       Quality of Life due to urinary symptoms   If you were to spend the rest of your life with your urinary condition just the way it is now how would you feel about that?  Pleased        Score:  1-7 Mild 8-19 Moderate 20-35 Severe   PMH: Past Medical History:  Diagnosis Date  . A-fib ([place])   . Anemia   . DVT (deep venous thrombosis) ([facility])    Monitor with PT/INR. Target 2.0 - 3.0   . Elevated PSA   . H/O degenerative disc disease   . Hypertension   . Lipomatosis   . Night sweats   . Obesity  Surgical History: Past Surgical History:  Procedure Laterality Date  . Bilateral Radical Keratotomy Bilateral 1997  . COLONOSCOPY WITH PROPOFOL N/A 01/11/2016   Procedure: COLONOSCOPY WITH PROPOFOL;  Surgeon: Manya Silvas, MD;  Location: [facility];  Service: Endoscopy;  Laterality: N/A; repeat 3 years, tubular adenoma  . deep vein thrombosis    . DENTAL SURGERY     Patient had implants  . GUM SURGERY    . L4 - S1 decompression Left    Left-sided  . PROSTATE BIOPSY    . TONSILLECTOMY AND ADENOIDECTOMY  age 33    Home Medications:  Allergies as of 08/26/2017      Reactions   Cefdinir Nausea Only   hypersensitivity to smell   Doxycycline    Sun sensitivity   Prednisone    Sertraline Other (See Comments)   Hallucinations       Medication List        Accurate as of 08/26/17  5:32 PM. Always use your most recent med list.          ALPRAZolam 0.25 MG tablet Commonly known as:  XANAX Take 1 tablet (0.25 mg total) by mouth every 6 (six) hours as needed for anxiety.   amLODipine 5 MG tablet Commonly known as:   NORVASC TAKE 1 TABLET EVERY DAY   benazepril 40 MG tablet Commonly known as:  LOTENSIN TAKE 1 TABLET EVERY DAY   carvedilol 6.25 MG tablet Commonly known as:  COREG TAKE 1 TABLET TWICE DAILY   doxazosin 8 MG tablet Commonly known as:  CARDURA TAKE 1 TABLET EVERY DAY   finasteride 5 MG tablet Commonly known as:  PROSCAR TAKE 1 TABLET EVERY DAY   gabapentin 300 MG capsule Commonly known as:  NEURONTIN TAKE 1 CAPSULE THREE TIMES DAILY   MULTIPLE VITAMIN PO Take by mouth.   triamterene-hydrochlorothiazide 37.5-25 MG tablet Commonly known as:  MAXZIDE-25 TAKE 1 TABLET EVERY DAY   VIAGRA 100 MG tablet Generic drug:  sildenafil Take by mouth.   warfarin 1 MG tablet Commonly known as:  COUMADIN Take as directed by the anticoagulation clinic. If you are unsure how to take this medication, talk to your nurse or doctor. Original instructions:  Take by mouth.   warfarin 4 MG tablet Commonly known as:  COUMADIN Take as directed by the anticoagulation clinic. If you are unsure how to take this medication, talk to your nurse or doctor. Original instructions:  TAKE 1 TABLET ONE TIME DAILY  AT  6PM   warfarin 5 MG tablet Commonly known as:  COUMADIN Take as directed by the anticoagulation clinic. If you are unsure how to take this medication, talk to your nurse or doctor. Original instructions:  TAKE 1 TABLET EVERY DAY       Allergies:  Allergies  Allergen Reactions  . Cefdinir Nausea Only    hypersensitivity to smell  . Doxycycline     Sun sensitivity  . Prednisone   . Sertraline Other (See Comments)    Hallucinations     Family History: Family History  Problem Relation Age of Onset  . Cancer Mother        secondary to cancinoma of the jaw from her dipping snuff.  . Heart attack Father   . CAD Father   . Hypertension Sister   . Diabetes Son        Type I diabetes  . Prostate cancer Neg Hx   . Kidney cancer Neg Hx     Social History:  reports that he quit  smoking about 46 years ago. he has never used smokeless tobacco. He reports that he drinks alcohol. He reports that he does not use drugs.  ROS: UROLOGY Frequent Urination?: No Hard to postpone urination?: No Burning/pain with urination?: No Get up at night to urinate?: No Leakage of urine?: No Urine stream starts and stops?: No Trouble starting stream?: No Do you have to strain to urinate?: No Blood in urine?: No Urinary tract infection?: No Sexually transmitted disease?: No Injury to kidneys or bladder?: No Painful intercourse?: No Weak stream?: No Erection problems?: No Penile pain?: No  Gastrointestinal Nausea?: No Vomiting?: No Indigestion/heartburn?: No Diarrhea?: No Constipation?: No  Constitutional Fever: No Night sweats?: No Weight loss?: No Fatigue?: No  Skin Skin rash/lesions?: No Itching?: No  Eyes Blurred vision?: No Double vision?: No  Ears/Nose/Throat Sore throat?: No Sinus problems?: No  Hematologic/Lymphatic Swollen glands?: No Easy bruising?: No  Cardiovascular Leg swelling?: No Chest pain?: No  Respiratory Cough?: No Shortness of breath?: No  Endocrine Excessive thirst?: No  Musculoskeletal Back pain?: No Joint pain?: No  Neurological Headaches?: No Dizziness?: No  Psychologic Depression?: No Anxiety?: No  Physical Exam: BP 101/65   Pulse 80   Ht 5\' 9"  (1.753 m)   Wt 200 lb (90.7 kg)   BMI 29.53 kg/m   Constitutional:  Alert and oriented, No acute distress. HEENT: [place] AT, moist mucus membranes.  Trachea midline, no masses. Cardiovascular: No clubbing, cyanosis, or edema. Respiratory: Normal respiratory effort, no increased work of breathing. GI: Abdomen is soft, nontender, nondistended. GU: No CVA tenderness.  Rectal: Deferred given age, will continue to check PSA since starting finasteride  Skin: No rashes, bruises or suspicious lesions. Neurologic: Grossly intact, no focal deficits, moving all 4  extremities. Psychiatric: Normal mood and affect.  Laboratory Data: Lab Results  Component Value Date   WBC 6.3 07/28/2016   HGB 13.2 07/28/2016   HCT 40.4 07/28/2016   MCV 94 07/28/2016   PLT 176 07/28/2016    Lab Results  Component Value Date   CREATININE 1.00 07/28/2016   Urinalysis: UA reviewed today, no evidence of blood  Imaging  Results for orders placed or performed in visit on 08/26/17  Microscopic Examination  Result Value Ref Range   WBC, UA None seen 0 - 5 /hpf   RBC, UA None seen 0 - 2 /hpf   Epithelial Cells (non renal) None seen 0 - 10 /hpf   Mucus, UA Present (A) Not Estab.   Bacteria, UA Few (A) None seen/Few  Urinalysis, Complete  Result Value Ref Range   Specific Gravity, UA 1.015 1.005 - 1.030   pH, UA 7.0 5.0 - 7.5   Color, UA Yellow Yellow   Appearance Ur Clear Clear   Leukocytes, UA Negative Negative   Protein, UA Negative Negative/Trace   Glucose, UA Negative Negative   Ketones, UA Negative Negative   RBC, UA Negative Negative   Bilirubin, UA Negative Negative   Urobilinogen, Ur 0.2 0.2 - 1.0 mg/dL   Nitrite, UA Negative Negative   Microscopic Examination See below:   BLADDER SCAN AMB NON-IMAGING  Result Value Ref Range   Scan Result 61ml    Results for orders placed or performed in visit on 08/26/17  Microscopic Examination  Result Value Ref Range   WBC, UA None seen 0 - 5 /hpf   RBC, UA None seen 0 - 2 /hpf   Epithelial Cells (non renal) None seen 0 - 10 /  hpf   Mucus, UA Present (A) Not Estab.   Bacteria, UA Few (A) None seen/Few  Urinalysis, Complete  Result Value Ref Range   Specific Gravity, UA 1.015 1.005 - 1.030   pH, UA 7.0 5.0 - 7.5   Color, UA Yellow Yellow   Appearance Ur Clear Clear   Leukocytes, UA Negative Negative   Protein, UA Negative Negative/Trace   Glucose, UA Negative Negative   Ketones, UA Negative Negative   RBC, UA Negative Negative   Bilirubin, UA Negative Negative   Urobilinogen, Ur 0.2 0.2 - 1.0  mg/dL   Nitrite, UA Negative Negative   Microscopic Examination See below:   BLADDER SCAN AMB NON-IMAGING  Result Value Ref Range   Scan Result 54ml      Assessment & Plan:   1. BPH with obstruction/lower urinary tract symptoms/ incomplete bladder emptying Improving on dual therapy with finasteride and Cardura PVR improved today REcheck in 12 months since doing very well - Urinalysis, Complete - BLADDER SCAN AMB NON-IMAGING  2. History of elevated PSA Recheck PSA x1 today after starting finasteride, anticipate drop in value Will likely defer further PSA screening based on age and comorbidities in the future  3. Erectile dysfunction, unspecified erectile dysfunction type Previously scussed alternative options for treatment of refractory ED, not interested at this time  4. Microsocpic hematuria/ dysuria Incidental isolated episode of microscopic blood at the time of dysuria Urine cytology negative No evidence of blood again today Declined cysto in the past, will continue to follow UA annually  Return in about 1 year (around 08/26/2018) for IPSS/ PVR/ UA.  Hollice Espy, MD  [facility] [address] [place], [place],  [address] [phone]

## 2017-08-27 LAB — PSA: Prostate Specific Ag, Serum: 5.5 ng/mL — ABNORMAL HIGH (ref 0.0–4.0)

## 2017-08-28 ENCOUNTER — Ambulatory Visit (INDEPENDENT_AMBULATORY_CARE_PROVIDER_SITE_OTHER): Payer: Medicare HMO

## 2017-08-28 DIAGNOSIS — I482 Chronic atrial fibrillation, unspecified: Secondary | ICD-10-CM

## 2017-08-28 LAB — POCT INR
INR: 2.7
PT: 32.4

## 2017-08-28 NOTE — Patient Instructions (Signed)
Description   Dx: Atrial Fibrillation ( I48.2) Alternate 4mg  and 5mg  daily. Recheck in 4 weeks.

## 2017-09-07 ENCOUNTER — Ambulatory Visit (INDEPENDENT_AMBULATORY_CARE_PROVIDER_SITE_OTHER): Payer: Medicare HMO | Admitting: Family Medicine

## 2017-09-07 ENCOUNTER — Other Ambulatory Visit: Payer: Self-pay

## 2017-09-07 ENCOUNTER — Other Ambulatory Visit: Payer: Self-pay | Admitting: Family Medicine

## 2017-09-07 VITALS — BP 98/56 | HR 72 | Temp 97.7°F | Resp 16 | Wt 211.0 lb

## 2017-09-07 DIAGNOSIS — I10 Essential (primary) hypertension: Secondary | ICD-10-CM

## 2017-09-07 DIAGNOSIS — D649 Anemia, unspecified: Secondary | ICD-10-CM | POA: Diagnosis not present

## 2017-09-07 DIAGNOSIS — R7309 Other abnormal glucose: Secondary | ICD-10-CM

## 2017-09-07 DIAGNOSIS — I38 Endocarditis, valve unspecified: Secondary | ICD-10-CM

## 2017-09-07 DIAGNOSIS — I480 Paroxysmal atrial fibrillation: Secondary | ICD-10-CM

## 2017-09-07 MED ORDER — TRIAMTERENE-HCTZ 37.5-25 MG PO TABS: ORAL_TABLET | ORAL | 12 refills | Status: DC

## 2017-09-07 NOTE — Progress Notes (Signed)
Casey Gallegos  MRN: [id number] DOB: 19-Dec-1934  Subjective:  HPI  The patient is an 83 year old male who presents for follow up of chronic disease.  He was last seen on 02/02/17.    Hypertension-On his last visit in June the patient was instructed that if his blood pressure were to drop low he was to decrease his Triamtene/HCTZ in half.  Patient states he has not had to decrease the dose any.  He checks his blood pressure periodically and states it runs 100-110 over 60-70.    Elevated glucose-the patient has not had his A1C checked since 07/2016.  At that time it was 5.4 and his glucose was 132.  Patient does not check his glucose and states he was unaware that his glucose was ever elevated.   Lipids-labs were normal on 10/18/15.  Patient had his INR last on 08/28/17 and was instructed to recheck in 4 weeks.    Patient Active Problem List   Diagnosis Date Noted  . A-fib ([place]) 03/02/2015  . Deep vein thrombosis ([place]) 03/02/2015  . Essential (primary) hypertension 03/02/2015  . Combined fat and carbohydrate induced hyperlipemia 03/02/2015  . Heart valve disease 03/02/2015  . Allergic rhinitis 02/28/2015  . Absolute anemia 02/28/2015  . 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 Back pain, chronic 02/28/2015  . Sex counseling 02/28/2015  . Narrowing of intervertebral disc space 02/28/2015  . Acute thromboembolism of deep veins of lower extremity ([place]) 02/28/2015  . ED (erectile dysfunction) of organic origin 02/28/2015  . Abnormal prostate specific antigen 02/28/2015  . BP (high blood pressure) 02/28/2015  . Lipomatosis 02/28/2015  . Malaise and fatigue 02/28/2015  . Generalized hyperhidrosis 02/28/2015  . Arthritis, degenerative 02/28/2015  . Adiposity 02/28/2015  . AF (paroxysmal atrial fibrillation) ([place]) 02/28/2015  . Chronic prostatitis 02/28/2015  . Spinal stenosis 02/28/2015  . Atypical pneumonia 02/28/2015    Past Medical History:  Diagnosis Date  . A-fib ([place])   .  Anemia   . DVT (deep venous thrombosis) ([facility])    Monitor with PT/INR. Target 2.0 - 3.0   . Elevated PSA   . H/O degenerative disc disease   . Hypertension   . Lipomatosis   . Night sweats   . Obesity     Social History   Socioeconomic History  . Marital status: Married    Spouse name: Not on file  . Number of children: Not on file  . Years of education: Not on file  . Highest education level: Not on file  Social Needs  . Financial resource strain: Not on file  . Food insecurity - worry: Not on file  . Food insecurity - inability: Not on file  . Transportation needs - medical: Not on file  . Transportation needs - non-medical: Not on file  Occupational History  . Not on file  Tobacco Use  . Smoking status: Former Smoker    Last attempt to quit: 08/26/1971    Years since quitting: 46.0  . Smokeless tobacco: Never Used  . Tobacco comment: Smoked for about 20 years.  Substance and Sexual Activity  . Alcohol use: Yes    Comment: Occasional  . Drug use: No  . Sexual activity: Not on file  Other Topics Concern  . Not on file  Social History Narrative  . Not on file    Outpatient Encounter Medications as of 09/07/2017  Medication Sig Note  . ALPRAZolam (XANAX) 0.25 MG tablet Take 1 tablet (0.25 mg total)  by mouth every 6 (six) hours as needed for anxiety.   Marland Kitchen amLODipine (NORVASC) 5 MG tablet TAKE 1 TABLET EVERY DAY   . benazepril (LOTENSIN) 40 MG tablet TAKE 1 TABLET EVERY DAY   . carvedilol (COREG) 6.25 MG tablet TAKE 1 TABLET TWICE DAILY   . doxazosin (CARDURA) 8 MG tablet TAKE 1 TABLET EVERY DAY   . finasteride (PROSCAR) 5 MG tablet TAKE 1 TABLET EVERY DAY   . gabapentin (NEURONTIN) 300 MG capsule TAKE 1 CAPSULE THREE TIMES DAILY   . MULTIPLE VITAMIN PO Take by mouth. 02/28/2015: Received from: [facility]  . sildenafil (VIAGRA) 100 MG tablet Take by mouth. 02/28/2015: Medication taken as needed. Samples given Received from: [facility]    . triamterene-hydrochlorothiazide (MAXZIDE-25) 37.5-25 MG tablet TAKE 1 TABLET EVERY DAY   . warfarin (COUMADIN) 1 MG tablet Take by mouth. 02/28/2015: Received from: [facility]  . warfarin (COUMADIN) 4 MG tablet TAKE 1 TABLET ONE TIME DAILY  AT  6PM   . warfarin (COUMADIN) 5 MG tablet TAKE 1 TABLET EVERY DAY    No facility-administered encounter medications on file as of 09/07/2017.     Allergies  Allergen Reactions  . Cefdinir Nausea Only    hypersensitivity to smell  . Doxycycline     Sun sensitivity  . Prednisone   . Sertraline Other (See Comments)    Hallucinations     Review of Systems  Constitutional: Negative for fever and malaise/fatigue.  HENT: Positive for congestion. Negative for ear discharge, ear pain, hearing loss, nosebleeds, sore throat and tinnitus. Sinus pain: Sinus pressure.   Eyes: Negative for blurred vision, double vision, photophobia, pain, discharge and redness.  Respiratory: Positive for cough (slight cough from sinus drainage) and wheezing (questionable). Negative for shortness of breath.   Cardiovascular: Negative for chest pain, palpitations, orthopnea and leg swelling.  Gastrointestinal: Negative.   Genitourinary: Negative.  Negative for frequency.  Musculoskeletal: Positive for back pain.  Neurological: Negative.  Negative for weakness.  Endo/Heme/Allergies: Negative for polydipsia.  Psychiatric/Behavioral: Negative.     Objective:  BP (!) 98/56 (BP Location: Right Arm, Patient Position: Sitting, Cuff Size: Normal)   Pulse 72   Temp 97.7 F (36.5 C) (Oral)   Resp 16   Wt 211 lb (95.7 kg)   BMI 31.16 kg/m   Physical Exam  Constitutional: He is oriented to person, place, and time and well-developed, well-nourished, and in no distress.  HENT:  Head: Normocephalic and atraumatic.  Eyes: Conjunctivae are normal.  Neck: No thyromegaly present.  Cardiovascular: Normal rate, regular rhythm and normal heart sounds.   Pulmonary/Chest: Effort normal and breath sounds normal.  Abdominal: Soft.  Neurological: He is alert and oriented to person, place, and time. Gait normal. GCS score is 15.  Skin: Skin is warm and dry.  Psychiatric: Mood, memory, affect and judgment normal.    Assessment and Plan :  1. Essential (primary) hypertension  - Comprehensive metabolic panel - Lipid Panel With LDL/HDL Ratio - TSH - triamterene-hydrochlorothiazide (MAXZIDE-25) 37.5-25 MG tablet; 1/2 tab daily  Dispense: 45 tablet; Refill: 12  2. Heart valve disease  - Lipid Panel With LDL/HDL Ratio  3. Anemia, unspecified type  - CBC with Differential/Platelet  4. Elevated glucose  - Comprehensive metabolic panel - Hemoglobin A1c - Lipid Panel With LDL/HDL Ratio - TSH 5.Afib  I have done the exam and reviewed the chart and it is accurate to the best of my knowledge. Development worker, community has  been used and  any errors in dictation or transcription are unintentional. Miguel Aschoff M.D. [facility]

## 2017-09-08 DIAGNOSIS — I10 Essential (primary) hypertension: Secondary | ICD-10-CM | POA: Diagnosis not present

## 2017-09-08 DIAGNOSIS — D649 Anemia, unspecified: Secondary | ICD-10-CM | POA: Diagnosis not present

## 2017-09-08 DIAGNOSIS — R7309 Other abnormal glucose: Secondary | ICD-10-CM | POA: Diagnosis not present

## 2017-09-08 DIAGNOSIS — I38 Endocarditis, valve unspecified: Secondary | ICD-10-CM | POA: Diagnosis not present

## 2017-09-09 LAB — CBC WITH DIFFERENTIAL/PLATELET
BASOS: 0 %
Basophils Absolute: 0 10*3/uL (ref 0.0–0.2)
EOS (ABSOLUTE): 0.2 10*3/uL (ref 0.0–0.4)
EOS: 2 %
HEMATOCRIT: 41.4 % (ref 37.5–51.0)
Hemoglobin: 14 g/dL (ref 13.0–17.7)
Immature Grans (Abs): 0 10*3/uL (ref 0.0–0.1)
Immature Granulocytes: 0 %
LYMPHS ABS: 1.8 10*3/uL (ref 0.7–3.1)
Lymphs: 25 %
MCH: 31.5 pg (ref 26.6–33.0)
MCHC: 33.8 g/dL (ref 31.5–35.7)
MCV: 93 fL (ref 79–97)
Monocytes Absolute: 0.6 10*3/uL (ref 0.1–0.9)
Monocytes: 8 %
NEUTROS ABS: 4.6 10*3/uL (ref 1.4–7.0)
NEUTROS PCT: 65 %
PLATELETS: 196 10*3/uL (ref 150–379)
RBC: 4.44 x10E6/uL (ref 4.14–5.80)
RDW: 15.1 % (ref 12.3–15.4)
WBC: 7.2 10*3/uL (ref 3.4–10.8)

## 2017-09-09 LAB — COMPREHENSIVE METABOLIC PANEL
A/G RATIO: 1.7 (ref 1.2–2.2)
ALT: 21 IU/L (ref 0–44)
AST: 21 IU/L (ref 0–40)
Albumin: 3.9 g/dL (ref 3.5–4.7)
Alkaline Phosphatase: 74 IU/L (ref 39–117)
BILIRUBIN TOTAL: 1.2 mg/dL (ref 0.0–1.2)
BUN/Creatinine Ratio: 20 (ref 10–24)
BUN: 28 mg/dL — ABNORMAL HIGH (ref 8–27)
CALCIUM: 9.1 mg/dL (ref 8.6–10.2)
CHLORIDE: 103 mmol/L (ref 96–106)
CO2: 26 mmol/L (ref 20–29)
Creatinine, Ser: 1.37 mg/dL — ABNORMAL HIGH (ref 0.76–1.27)
GFR calc Af Amer: 56 mL/min/{1.73_m2} — ABNORMAL LOW (ref >59)
GFR calc non Af Amer: 48 mL/min/{1.73_m2} — ABNORMAL LOW (ref >59)
GLOBULIN, TOTAL: 2.3 g/dL (ref 1.5–4.5)
Glucose: 103 mg/dL — ABNORMAL HIGH (ref 65–99)
POTASSIUM: 4.1 mmol/L (ref 3.5–5.2)
SODIUM: 143 mmol/L (ref 134–144)
Total Protein: 6.2 g/dL (ref 6.0–8.5)

## 2017-09-09 LAB — HEMOGLOBIN A1C
ESTIMATED AVERAGE GLUCOSE: 117 mg/dL
Hgb A1c MFr Bld: 5.7 % — ABNORMAL HIGH (ref 4.8–5.6)

## 2017-09-09 LAB — LIPID PANEL WITH LDL/HDL RATIO
Cholesterol, Total: 190 mg/dL (ref 100–199)
HDL: 62 mg/dL (ref >39)
LDL Calculated: 101 mg/dL — ABNORMAL HIGH (ref 0–99)
LDl/HDL Ratio: 1.6 ratio (ref 0.0–3.6)
TRIGLYCERIDES: 133 mg/dL (ref 0–149)
VLDL CHOLESTEROL CAL: 27 mg/dL (ref 5–40)

## 2017-09-09 LAB — TSH: TSH: 2.23 u[IU]/mL (ref 0.450–4.500)

## 2017-09-10 ENCOUNTER — Telehealth: Payer: Self-pay | Admitting: Emergency Medicine

## 2017-09-10 NOTE — Telephone Encounter (Signed)
-----   Message from Jerrol Banana., MD sent at 09/10/2017 12:34 PM EST ----- Labs okay but there is been a slight decline in kidney function.  Avoid Advil or Aleve.  This is not an abnormal decline with age either.

## 2017-09-10 NOTE — Telephone Encounter (Signed)
LMCTB

## 2017-09-10 NOTE — Telephone Encounter (Signed)
Advised patient of results.  

## 2017-09-17 DIAGNOSIS — H2640 Unspecified secondary cataract: Secondary | ICD-10-CM | POA: Diagnosis not present

## 2017-09-21 ENCOUNTER — Other Ambulatory Visit: Payer: Self-pay | Admitting: Family Medicine

## 2017-09-21 DIAGNOSIS — I10 Essential (primary) hypertension: Secondary | ICD-10-CM

## 2017-09-30 ENCOUNTER — Ambulatory Visit (INDEPENDENT_AMBULATORY_CARE_PROVIDER_SITE_OTHER): Payer: Medicare HMO

## 2017-09-30 DIAGNOSIS — I482 Chronic atrial fibrillation, unspecified: Secondary | ICD-10-CM

## 2017-09-30 LAB — POCT INR
INR: 2.6
PT: 31.7

## 2017-09-30 NOTE — Patient Instructions (Signed)
Dx: Atrial Fibrillation ( I48.2) Continue alternating 4mg  and 5mg  daily.  Recheck in four weeks.

## 2017-10-21 ENCOUNTER — Other Ambulatory Visit: Payer: Self-pay | Admitting: Physical Medicine and Rehabilitation

## 2017-10-21 DIAGNOSIS — M48062 Spinal stenosis, lumbar region with neurogenic claudication: Secondary | ICD-10-CM | POA: Diagnosis not present

## 2017-10-21 DIAGNOSIS — M5416 Radiculopathy, lumbar region: Secondary | ICD-10-CM | POA: Diagnosis not present

## 2017-10-21 DIAGNOSIS — M5136 Other intervertebral disc degeneration, lumbar region: Secondary | ICD-10-CM | POA: Diagnosis not present

## 2017-10-21 DIAGNOSIS — L819 Disorder of pigmentation, unspecified: Secondary | ICD-10-CM | POA: Diagnosis not present

## 2017-10-24 ENCOUNTER — Ambulatory Visit
Admission: RE | Admit: 2017-10-24 | Discharge: 2017-10-24 | Disposition: A | Discharge status: Home / Self Care | Payer: Medicare HMO | Source: Ambulatory Visit | Attending: Physical Medicine and Rehabilitation | Admitting: Physical Medicine and Rehabilitation

## 2017-10-24 DIAGNOSIS — M5416 Radiculopathy, lumbar region: Secondary | ICD-10-CM

## 2017-10-24 DIAGNOSIS — M5116 Intervertebral disc disorders with radiculopathy, lumbar region: Secondary | ICD-10-CM | POA: Insufficient documentation

## 2017-10-24 DIAGNOSIS — M8938 Hypertrophy of bone, other site: Secondary | ICD-10-CM | POA: Insufficient documentation

## 2017-10-24 DIAGNOSIS — M48061 Spinal stenosis, lumbar region without neurogenic claudication: Secondary | ICD-10-CM | POA: Diagnosis not present

## 2017-10-24 DIAGNOSIS — M2578 Osteophyte, vertebrae: Secondary | ICD-10-CM | POA: Insufficient documentation

## 2017-10-24 DIAGNOSIS — M545 Low back pain: Secondary | ICD-10-CM | POA: Diagnosis not present

## 2017-10-27 ENCOUNTER — Ambulatory Visit (INDEPENDENT_AMBULATORY_CARE_PROVIDER_SITE_OTHER): Payer: Medicare HMO | Admitting: Vascular Surgery

## 2017-10-27 ENCOUNTER — Encounter (INDEPENDENT_AMBULATORY_CARE_PROVIDER_SITE_OTHER): Payer: Self-pay | Admitting: Vascular Surgery

## 2017-10-27 VITALS — BP 121/76 | HR 56 | Resp 15 | Ht 69.0 in | Wt 209.0 lb

## 2017-10-27 DIAGNOSIS — M79609 Pain in unspecified limb: Secondary | ICD-10-CM | POA: Insufficient documentation

## 2017-10-27 DIAGNOSIS — I482 Chronic atrial fibrillation, unspecified: Secondary | ICD-10-CM

## 2017-10-27 DIAGNOSIS — I10 Essential (primary) hypertension: Secondary | ICD-10-CM

## 2017-10-27 DIAGNOSIS — M4800 Spinal stenosis, site unspecified: Secondary | ICD-10-CM

## 2017-10-27 DIAGNOSIS — M79605 Pain in left leg: Secondary | ICD-10-CM | POA: Diagnosis not present

## 2017-10-27 NOTE — Patient Instructions (Signed)

## 2017-10-27 NOTE — Progress Notes (Signed)
Patient ID: Casey Gallegos, male   DOB: 1935/08/04, 83 y.o.   MRN: [id number]  Chief Complaint  Patient presents with  . New Patient (Initial Visit)    LE color changes    HPI Casey Gallegos is a 83 y.o. male.  I am asked to see the patient by Dr. Ahmed Prima for evaluation of left leg pain and discoloration.  The patient reports having a blood clot in the left leg about 8 years ago.  He also has back surgery with spinal stenosis predominantly affecting the left leg.  He has noticed more discoloration and more prominent varicosities on the left leg.  This is associated with some swelling and heaviness in the leg.  He gets cramps at night.  He reports no ulceration or infection.  No significant right leg symptoms.  Walking seems to make the pain somewhat worse but not consistently so.  Swelling is an intermittent symptom.   Past Medical History:  Diagnosis Date  . A-fib ([place])   . Anemia   . DVT (deep venous thrombosis) ([facility])    Monitor with PT/INR. Target 2.0 - 3.0   . Elevated PSA   . H/O degenerative disc disease   . Hypertension   . Lipomatosis   . Night sweats   . Obesity     Past Surgical History:  Procedure Laterality Date  . Bilateral Radical Keratotomy Bilateral 1997  . COLONOSCOPY WITH PROPOFOL N/A 01/11/2016   Procedure: COLONOSCOPY WITH PROPOFOL;  Surgeon: Manya Silvas, MD;  Location: [facility] ENDOSCOPY;  Service: Endoscopy;  Laterality: N/A; repeat 3 years, tubular adenoma  . deep vein thrombosis    . DENTAL SURGERY     Patient had implants  . GUM SURGERY    . L4 - S1 decompression Left    Left-sided  . PROSTATE BIOPSY    . TONSILLECTOMY AND ADENOIDECTOMY  age 72    Family History  Problem Relation Age of Onset  . Cancer Mother        secondary to cancinoma of the jaw from her dipping snuff.  . Heart attack Father   . CAD Father   . Hypertension Sister   . Diabetes Son        Type I diabetes  . Prostate cancer Neg Hx   . Kidney cancer Neg Hx      Social  History Social History   Tobacco Use  . Smoking status: Former Smoker    Last attempt to quit: 08/26/1971    Years since quitting: 46.2  . Smokeless tobacco: Never Used  . Tobacco comment: Smoked for about 20 years.  Substance Use Topics  . Alcohol use: Yes    Comment: Occasional  . Drug use: No     Allergies  Allergen Reactions  . Cefdinir Nausea Only    hypersensitivity to smell  . Doxycycline     Sun sensitivity  . Prednisone   . Sertraline Other (See Comments)    Hallucinations     Current Outpatient Medications  Medication Sig Dispense Refill  . amLODipine (NORVASC) 5 MG tablet TAKE 1 TABLET EVERY DAY 90 tablet 3  . benazepril (LOTENSIN) 40 MG tablet TAKE 1 TABLET EVERY DAY 90 tablet 3  . carvedilol (COREG) 6.25 MG tablet TAKE 1 TABLET TWICE DAILY 180 tablet 3  . doxazosin (CARDURA) 8 MG tablet TAKE 1 TABLET EVERY DAY 90 tablet 3  . finasteride (PROSCAR) 5 MG tablet TAKE 1 TABLET EVERY DAY 90 tablet 3  .  gabapentin (NEURONTIN) 300 MG capsule TAKE 1 CAPSULE THREE TIMES DAILY 270 capsule 3  . Magnesium 500 MG TABS Take by mouth.    . MULTIPLE VITAMIN PO Take by mouth.    . sildenafil (VIAGRA) 100 MG tablet Take by mouth.    . triamterene-hydrochlorothiazide (MAXZIDE-25) 37.5-25 MG tablet TAKE 1/2 TABLET EVERY DAY 45 tablet 3  . warfarin (COUMADIN) 1 MG tablet Take by mouth.    . warfarin (COUMADIN) 4 MG tablet TAKE 1 TABLET ONE TIME DAILY  AT  6PM 90 tablet 3  . warfarin (COUMADIN) 5 MG tablet TAKE 1 TABLET EVERY DAY 90 tablet 3  . ALPRAZolam (XANAX) 0.25 MG tablet Take 1 tablet (0.25 mg total) by mouth every 6 (six) hours as needed for anxiety. (Patient not taking: Reported on 10/27/2017) 55 tablet 2   No current facility-administered medications for this visit.       REVIEW OF SYSTEMS (Negative unless checked)  Constitutional: [] Weight loss  [] Fever  [] Chills Cardiac: [] Chest pain   [] Chest pressure   [] Palpitations   [] Shortness of breath when laying flat    [] Shortness of breath at rest   [] Shortness of breath with exertion. Vascular:  [x] Pain in legs with walking   [x] Pain in legs at rest   [] Pain in legs when laying flat   [] Claudication   [] Pain in feet when walking  [] Pain in feet at rest  [] Pain in feet when laying flat   [x] History of DVT   [] Phlebitis   [x] Swelling in legs   [] Varicose veins   [] Non-healing ulcers Pulmonary:   [] Uses home oxygen   [] Productive cough   [] Hemoptysis   [] Wheeze  [] COPD   [] Asthma Neurologic:  [] Dizziness  [] Blackouts   [] Seizures   [] History of stroke   [] History of TIA  [] Aphasia   [] Temporary blindness   [] Dysphagia   [] Weakness or numbness in arms   [] Weakness or numbness in legs Musculoskeletal:  [x] Arthritis   [] Joint swelling   [] Joint pain   [x] Low back pain Hematologic:  [] Easy bruising  [] Easy bleeding   [] Hypercoagulable state   [] Anemic  [] Hepatitis Gastrointestinal:  [] Blood in stool   [] Vomiting blood  [] Gastroesophageal reflux/heartburn   [] Abdominal pain Genitourinary:  [] Chronic kidney disease   [] Difficult urination  [] Frequent urination  [] Burning with urination   [] Hematuria Skin:  [] Rashes   [] Ulcers   [] Wounds Psychological:  [] History of anxiety   []  History of major depression.    Physical Exam BP 121/76 (BP Location: Right Arm, Patient Position: Sitting)   Pulse (!) 56   Resp 15   Ht 5\' 9"  (1.753 m)   Wt 94.8 kg (209 lb)   BMI 30.86 kg/m  Gen:  WD/WN, NAD.  Appears younger than stated age Head: [place]/AT, No temporalis wasting. Ear/Nose/Throat: Hearing grossly intact, nares w/o erythema or drainage, oropharynx w/o Erythema/Exudate Eyes: Conjunctiva clear, sclera non-icteric  Neck: trachea midline.  No JVD.  Pulmonary:  Good air movement, respirations not labored, no use of accessory muscles Cardiac: Irregularly irregular Vascular:  Vessel Right Left  Radial Palpable Palpable                          PT  1+ palpable  1+ palpable  DP Palpable  1+ palpable   Gastrointestinal:  soft, non-tender/non-distended. Musculoskeletal: M/S 5/5 throughout.  Extremities without ischemic changes.  No deformity or atrophy.  Mild left lower extremity edema.  Stasis dermatitis with moderate varicosities of the left  lower extremities Neurologic: Sensation grossly intact in extremities.  Symmetrical.  Speech is fluent. Motor exam as listed above. Psychiatric: Judgment intact, Mood & affect appropriate for pt's clinical situation. Dermatologic: No rashes or ulcers noted.  No cellulitis or open wounds.  Radiology Mr Lumbar Spine Wo Contrast  Result Date: 10/24/2017 CLINICAL DATA:  Initial evaluation for left-sided low back pain radiating into left lower extremity, bilateral foot numbness, worse on left. History of prior lumbar surgery. EXAM: MRI LUMBAR SPINE WITHOUT CONTRAST TECHNIQUE: Multiplanar, multisequence MR imaging of the lumbar spine was performed. No intravenous contrast was administered. COMPARISON:  Prior MRI from 06/03/2013. FINDINGS: Segmentation: Normal segmentation. Lowest well-formed disc labeled the L5-S1 level. Alignment: Chronic 4 mm retrolisthesis of L3 on L4 and L4 on L5, similar to previous. Vertebral bodies otherwise normally aligned with preservation of the normal lumbar lordosis. Vertebrae: Vertebral body heights are maintained without evidence for acute or chronic fracture. Chronic endplate Schmorl's nodes present at the inferior endplates of L1 and L3. Chronic reactive endplate changes present about the L2-3 through L5-S1 interspaces. Underlying bone marrow signal intensity within normal limits. No discrete or worrisome osseous lesions. No abnormal marrow edema. Conus medullaris and cauda equina: Conus extends to the L1-2 level. Conus and cauda equina appear normal. Paraspinal and other soft tissues: Paraspinous soft tissues within normal limits. Scattered T2 hyperintense cyst noted within the kidneys bilaterally, right greater than left. Visualized visceral structures  otherwise unremarkable. Disc levels: T11-12: Seen only on sagittal projection. Diffuse disc bulge with disc desiccation. Mild bilateral facet hypertrophy. No significant spinal stenosis. Mild bilateral foraminal narrowing. T12-L1: Unremarkable. L1-2: Mild diffuse disc bulge. Mild right greater than left facet hypertrophy. No canal or foraminal stenosis. L2-3: Chronic intervertebral disc space narrowing with diffuse disc bulge and disc desiccation. Left far lateral/extraforaminal reactive endplate changes with marginal endplate osteophytic spurring. Exiting left L2 nerve root displaced by the intervertebral spurring/reactive endplate changes. Mild facet hypertrophy. No significant canal or neural foraminal stenosis. L3-4: Chronic intervertebral disc space narrowing with diffuse disc bulge with disc desiccation. Right far lateral/extraforaminal reactive endplate changes with marginal endplate osteophytic spurring. Moderate right with mild left facet arthrosis. Resultant moderate right lateral recess and foraminal stenosis. Either the right L3 or descending L4 nerve roots could be affected. Mild canal and left neural foraminal stenosis present as well. L4-5: Mild disc bulge with disc desiccation, intervertebral disc space narrowing, and reactive endplate changes, greater on the left. Sequelae of remote left hemi laminectomy. Mild to moderate facet hypertrophy. No significant canal or lateral recess stenosis. Moderate right with severe left L4 foraminal stenosis due to disc bulge, endplate changes, and facet disease. L5-S1: Chronic intervertebral disc space narrowing with diffuse disc bulge and disc desiccation. Chronic reactive endplate changes with marginal endplate osteophytic spurring. Remote left hemi laminectomy. Mild bilateral facet hypertrophy, slightly greater on the left. Left-sided ligamentum flavum thickening closely approximates the descending left S1 nerve root without impingement. No significant canal  stenosis. Moderate bilateral L5 foraminal stenosis. IMPRESSION: 1. Chronic degenerative disc bulge with reactive endplate changes and facet hypertrophy at L4-5 with resultant severe left and moderate right L4 foraminal narrowing. 2. Multifactorial degenerative changes at L5-S1 with resultant moderate bilateral L5 foraminal stenosis. 3. Right eccentric disc osteophyte with facet hypertrophy at L3-4, resulting in moderate right lateral recess and L3 foraminal stenosis. Either the right L3 or descending L4 nerve roots could be affected. Electronically Signed   By: Jeannine Boga M.D.   On: 10/24/2017 21:13  Labs Recent Results (from the past 2160 hour(s))  Urinalysis, Complete     Status: None   Collection Time: 08/26/17  9:59 AM  Result Value Ref Range   Specific Gravity, UA 1.015 1.005 - 1.030   pH, UA 7.0 5.0 - 7.5   Color, UA Yellow Yellow   Appearance Ur Clear Clear   Leukocytes, UA Negative Negative   Protein, UA Negative Negative/Trace   Glucose, UA Negative Negative   Ketones, UA Negative Negative   RBC, UA Negative Negative   Bilirubin, UA Negative Negative   Urobilinogen, Ur 0.2 0.2 - 1.0 mg/dL   Nitrite, UA Negative Negative   Microscopic Examination See below:   Microscopic Examination     Status: Abnormal   Collection Time: 08/26/17  9:59 AM  Result Value Ref Range   WBC, UA None seen 0 - 5 /hpf   RBC, UA None seen 0 - 2 /hpf   Epithelial Cells (non renal) None seen 0 - 10 /hpf   Mucus, UA Present (A) Not Estab.   Bacteria, UA Few (A) None seen/Few  BLADDER SCAN AMB NON-IMAGING     Status: None   Collection Time: 08/26/17 10:05 AM  Result Value Ref Range   Scan Result 30ml   PSA     Status: Abnormal   Collection Time: 08/26/17 10:32 AM  Result Value Ref Range   Prostate Specific Ag, Serum 5.5 (H) 0.0 - 4.0 ng/mL    Comment: Roche ECLIA methodology. According to the American Urological Association, Serum PSA should decrease and remain at undetectable levels  after radical prostatectomy. The AUA defines biochemical recurrence as an initial PSA value 0.2 ng/mL or greater followed by a subsequent confirmatory PSA value 0.2 ng/mL or greater. Values obtained with different assay methods or kits cannot be used interchangeably. Results cannot be interpreted as absolute evidence of the presence or absence of malignant disease.   POCT INR     Status: None   Collection Time: 08/28/17 11:18 AM  Result Value Ref Range   INR 2.7    PT 32.4   CBC with Differential/Platelet     Status: None   Collection Time: 09/08/17  9:04 AM  Result Value Ref Range   WBC 7.2 3.4 - 10.8 x10E3/uL   RBC 4.44 4.14 - 5.80 x10E6/uL   Hemoglobin 14.0 13.0 - 17.7 g/dL   Hematocrit 41.4 37.5 - 51.0 %   MCV 93 79 - 97 fL   MCH 31.5 26.6 - 33.0 pg   MCHC 33.8 31.5 - 35.7 g/dL   RDW 15.1 12.3 - 15.4 %   Platelets 196 150 - 379 x10E3/uL   Neutrophils 65 Not Estab. %   Lymphs 25 Not Estab. %   Monocytes 8 Not Estab. %   Eos 2 Not Estab. %   Basos 0 Not Estab. %   Neutrophils Absolute 4.6 1.4 - 7.0 x10E3/uL   Lymphocytes Absolute 1.8 0.7 - 3.1 x10E3/uL   Monocytes Absolute 0.6 0.1 - 0.9 x10E3/uL   EOS (ABSOLUTE) 0.2 0.0 - 0.4 x10E3/uL   Basophils Absolute 0.0 0.0 - 0.2 x10E3/uL   Immature Granulocytes 0 Not Estab. %   Immature Grans (Abs) 0.0 0.0 - 0.1 x10E3/uL  Comprehensive metabolic panel     Status: Abnormal   Collection Time: 09/08/17  9:04 AM  Result Value Ref Range   Glucose 103 (H) 65 - 99 mg/dL   BUN 28 (H) 8 - 27 mg/dL   Creatinine, Ser 1.37 (H) 0.76 -  1.27 mg/dL   GFR calc non Af Amer 48 (L) >59 mL/min/1.73   GFR calc Af Amer 56 (L) >59 mL/min/1.73   BUN/Creatinine Ratio 20 10 - 24   Sodium 143 134 - 144 mmol/L   Potassium 4.1 3.5 - 5.2 mmol/L   Chloride 103 96 - 106 mmol/L   CO2 26 20 - 29 mmol/L   Calcium 9.1 8.6 - 10.2 mg/dL   Total Protein 6.2 6.0 - 8.5 g/dL   Albumin 3.9 3.5 - 4.7 g/dL   Globulin, Total 2.3 1.5 - 4.5 g/dL   Albumin/Globulin  Ratio 1.7 1.2 - 2.2   Bilirubin Total 1.2 0.0 - 1.2 mg/dL   Alkaline Phosphatase 74 39 - 117 IU/L   AST 21 0 - 40 IU/L   ALT 21 0 - 44 IU/L  Hemoglobin A1c     Status: Abnormal   Collection Time: 09/08/17  9:04 AM  Result Value Ref Range   Hgb A1c MFr Bld 5.7 (H) 4.8 - 5.6 %    Comment:          Prediabetes: 5.7 - 6.4          Diabetes: >6.4          Glycemic control for adults with diabetes: <7.0    Est. average glucose Bld gHb Est-mCnc 117 mg/dL  Lipid Panel With LDL/HDL Ratio     Status: Abnormal   Collection Time: 09/08/17  9:04 AM  Result Value Ref Range   Cholesterol, Total 190 100 - 199 mg/dL   Triglycerides 133 0 - 149 mg/dL   HDL 62 >39 mg/dL   VLDL Cholesterol Cal 27 5 - 40 mg/dL   LDL Calculated 101 (H) 0 - 99 mg/dL   LDl/HDL Ratio 1.6 0.0 - 3.6 ratio    Comment:                                     LDL/HDL Ratio                                             Men  Women                               1/2 Avg.Risk  1.0    1.5                                   Avg.Risk  3.6    3.2                                2X Avg.Risk  6.2    5.0                                3X Avg.Risk  8.0    6.1   TSH     Status: None   Collection Time: 09/08/17  9:04 AM  Result Value Ref Range   TSH 2.230 0.450 - 4.500 uIU/mL  POCT INR     Status: Normal   Collection Time: 09/30/17 10:50 AM  Result  Value Ref Range   INR 2.6     Comment: Continue current alternating 4mg  and 5mg .  Recheck in four weeks.    PT 31.7     Assessment/Plan:  BP (high blood pressure) blood pressure control important in reducing the progression of atherosclerotic disease. On appropriate oral medications.   A-fib On anticoagulation  Spinal stenosis Could certainly be contributing to lower extremity symptoms  Pain in limb  Recommend:  The patient has atypical pain symptoms for pure atherosclerotic disease. However, on physical exam there is evidence of mixed venous and arterial disease, given the diminished  pulses and the edema associated with venous changes of the legs.  Noninvasive studies including ABI's and venous ultrasound of the legs will be obtained and the patient will follow up with me to review these studies.  The patient should continue walking and begin a more formal exercise program. The patient should continue his antiplatelet therapy and aggressive treatment of the lipid abnormalities.  The patient should begin wearing graduated compression socks 15-20 mmHg strength to control edema.       Leotis Pain 10/27/2017, 11:54 AM   This note was created with Dragon medical transcription system.  Any errors from dictation are unintentional.

## 2017-10-27 NOTE — Assessment & Plan Note (Signed)
Could certainly be contributing to lower extremity symptoms

## 2017-10-27 NOTE — Assessment & Plan Note (Signed)
blood pressure control important in reducing the progression of atherosclerotic disease. On appropriate oral medications.  

## 2017-10-27 NOTE — Assessment & Plan Note (Signed)
On anticoagulation 

## 2017-10-27 NOTE — Assessment & Plan Note (Signed)
Recommend:  The patient has atypical pain symptoms for pure atherosclerotic disease. However, on physical exam there is evidence of mixed venous and arterial disease, given the diminished pulses and the edema associated with venous changes of the legs.  Noninvasive studies including ABI's and venous ultrasound of the legs will be obtained and the patient will follow up with me to review these studies.  The patient should continue walking and begin a more formal exercise program. The patient should continue his antiplatelet therapy and aggressive treatment of the lipid abnormalities.  The patient should begin wearing graduated compression socks 15-20 mmHg strength to control edema.

## 2017-10-28 ENCOUNTER — Ambulatory Visit (INDEPENDENT_AMBULATORY_CARE_PROVIDER_SITE_OTHER): Payer: Medicare HMO

## 2017-10-28 DIAGNOSIS — I482 Chronic atrial fibrillation, unspecified: Secondary | ICD-10-CM

## 2017-10-28 LAB — POCT INR
INR: 2.5
PT: 30.2

## 2017-10-28 NOTE — Patient Instructions (Signed)
Description   Dx: Atrial Fibrillation ( I48.2) Continue alternating 4mg  and 5mg  daily.  Recheck in four weeks.

## 2017-11-11 ENCOUNTER — Ambulatory Visit (INDEPENDENT_AMBULATORY_CARE_PROVIDER_SITE_OTHER): Payer: Medicare HMO | Admitting: Vascular Surgery

## 2017-11-11 ENCOUNTER — Encounter (INDEPENDENT_AMBULATORY_CARE_PROVIDER_SITE_OTHER): Payer: Self-pay | Admitting: Vascular Surgery

## 2017-11-11 ENCOUNTER — Ambulatory Visit (INDEPENDENT_AMBULATORY_CARE_PROVIDER_SITE_OTHER): Payer: Medicare HMO

## 2017-11-11 VITALS — BP 156/90 | HR 61 | Resp 16 | Wt 212.0 lb

## 2017-11-11 DIAGNOSIS — I872 Venous insufficiency (chronic) (peripheral): Secondary | ICD-10-CM | POA: Insufficient documentation

## 2017-11-11 DIAGNOSIS — I890 Lymphedema, not elsewhere classified: Secondary | ICD-10-CM

## 2017-11-11 DIAGNOSIS — M79605 Pain in left leg: Secondary | ICD-10-CM

## 2017-11-11 NOTE — Progress Notes (Signed)
Subjective:    Patient ID: Casey Gallegos, male    DOB: May 03, 1935, 83 y.o.   MRN: [id number] Chief Complaint  Patient presents with  . Follow-up    pt conv abi,left ven reflux   Patient presents to review vascular studies.  The patient was last seen on October 27, 2017 for evaluation of left lower extremity swelling, varicose veins and bluish tint.  The patient's symptoms remained stable.  The patient has not started to engage in conservative therapy at this time.  The patient denies a worsening in his symptoms.  The patient underwent a bilateral ABI which was notable for no evidence of significant bilateral lower extremity arterial disease.  Bilateral triphasic tibials the patient underwent a left lower extremity venous duplex which was notable for abnormal reflux times in the left common femoral vein, left femoral vein in the thigh, left popliteal vein, left great saphenous vein at the proximal thigh, left great saphenous vein at the distal thigh, left great saphenous vein at the knee, left great saphenous vein at the proximal calf and left small saphenous vein at the level of the saphenous popliteal junction.  There is no evidence of deep vein or superficial thrombophlebitis.  The patient denies any fever, nausea vomiting.   Review of Systems  Constitutional: Negative.   HENT: Negative.   Eyes: Negative.   Respiratory: Negative.   Cardiovascular: Positive for leg swelling.       Varicose veins Bluish tint to toes  Gastrointestinal: Negative.   Endocrine: Negative.   Genitourinary: Negative.   Musculoskeletal: Negative.   Skin: Negative.   Allergic/Immunologic: Negative.   Neurological: Negative.   Hematological: Negative.   Psychiatric/Behavioral: Negative.       Objective:   Physical Exam  Constitutional: He is oriented to person, place, and time. He appears well-developed and well-nourished. No distress.  HENT:  Head: Normocephalic and atraumatic.  Eyes: Conjunctivae are normal.  Pupils are equal, round, and reactive to light.  Neck: Normal range of motion.  Cardiovascular: Normal rate, regular rhythm, normal heart sounds and intact distal pulses.  Pulses:      Radial pulses are 2+ on the right side, and 2+ on the left side.       Dorsalis pedis pulses are 2+ on the right side, and 2+ on the left side.       Posterior tibial pulses are 2+ on the right side, and 2+ on the left side.  Pulmonary/Chest: Effort normal and breath sounds normal.  Musculoskeletal: Normal range of motion. He exhibits edema (Mild to moderate left lower extremity edema).  Neurological: He is alert and oriented to person, place, and time.  Skin: Skin is warm and dry. He is not diaphoretic.  Psychiatric: He has a normal mood and affect. His behavior is normal. Judgment and thought content normal.  Vitals reviewed.  BP (!) 156/90 (BP Location: Right Arm)   Pulse 61   Resp 16   Wt 212 lb (96.2 kg)   BMI 31.31 kg/m   Past Medical History:  Diagnosis Date  . A-fib ([place])   . Anemia   . DVT (deep venous thrombosis) ([facility])    Monitor with PT/INR. Target 2.0 - 3.0   . Elevated PSA   . H/O degenerative disc disease   . Hypertension   . Lipomatosis   . Night sweats   . Obesity    Social History   Socioeconomic History  . Marital status: Married    Spouse name: Not  on file  . Number of children: Not on file  . Years of education: Not on file  . Highest education level: Not on file  Social Needs  . Financial resource strain: Not on file  . Food insecurity - worry: Not on file  . Food insecurity - inability: Not on file  . Transportation needs - medical: Not on file  . Transportation needs - non-medical: Not on file  Occupational History  . Not on file  Tobacco Use  . Smoking status: Former Smoker    Last attempt to quit: 08/26/1971    Years since quitting: 46.2  . Smokeless tobacco: Never Used  . Tobacco comment: Smoked for about 20 years.  Substance and Sexual Activity  .  Alcohol use: Yes    Comment: Occasional  . Drug use: No  . Sexual activity: Not on file  Other Topics Concern  . Not on file  Social History Narrative  . Not on file   Past Surgical History:  Procedure Laterality Date  . Bilateral Radical Keratotomy Bilateral 1997  . COLONOSCOPY WITH PROPOFOL N/A 01/11/2016   Procedure: COLONOSCOPY WITH PROPOFOL;  Surgeon: Manya Silvas, MD;  Location: [facility] ENDOSCOPY;  Service: Endoscopy;  Laterality: N/A; repeat 3 years, tubular adenoma  . deep vein thrombosis    . DENTAL SURGERY     Patient had implants  . GUM SURGERY    . L4 - S1 decompression Left    Left-sided  . PROSTATE BIOPSY    . TONSILLECTOMY AND ADENOIDECTOMY  age 58   Family History  Problem Relation Age of Onset  . Cancer Mother        secondary to cancinoma of the jaw from her dipping snuff.  . Heart attack Father   . CAD Father   . Hypertension Sister   . Diabetes Son        Type I diabetes  . Prostate cancer Neg Hx   . Kidney cancer Neg Hx    Allergies  Allergen Reactions  . Cefdinir Nausea Only    hypersensitivity to smell  . Doxycycline     Sun sensitivity  . Prednisone   . Sertraline Other (See Comments)    Hallucinations       Assessment & Plan:  Patient presents to review vascular studies.  The patient was last seen on October 27, 2017 for evaluation of left lower extremity swelling, varicose veins and bluish tint.  The patient's symptoms remained stable.  The patient has not started to engage in conservative therapy at this time.  The patient denies a worsening in his symptoms.  The patient underwent a bilateral ABI which was notable for no evidence of significant bilateral lower extremity arterial disease.  Bilateral triphasic tibials the patient underwent a left lower extremity venous duplex which was notable for abnormal reflux times in the left common femoral vein, left femoral vein in the thigh, left popliteal vein, left great saphenous vein at the proximal  thigh, left great saphenous vein at the distal thigh, left great saphenous vein at the knee, left great saphenous vein at the proximal calf and left small saphenous vein at the level of the saphenous popliteal junction.  There is no evidence of deep vein or superficial thrombophlebitis.  The patient denies any fever, nausea vomiting.  1. Chronic venous insufficiency - New The patient was encouraged to wear graduated compression stockings (20-30 mmHg) on a daily basis. The patient was instructed to begin wearing the stockings first thing in  the morning and removing them in the evening. The patient was instructed specifically not to sleep in the stockings. Prescription given. In addition, behavioral modification including elevation during the day will be initiated. Anti-inflammatories for pain. The patient is likely to benefit from endovenous laser ablation. I have discussed the risks and benefits of the procedure. The risks primarily include DVT, recanalization, bleeding, infection, and inability to gain access. The patient will follow up in three months to asses conservative management.  Information on chronic venous insufficiency and compression stockings was given to the patient. The patient was instructed to call the office in the interim if any worsening edema or ulcerations to the legs, feet or toes occurs. The patient expresses their understanding.  2. Lymphedema - New As above  Current Outpatient Medications on File Prior to Visit  Medication Sig Dispense Refill  . amLODipine (NORVASC) 5 MG tablet TAKE 1 TABLET EVERY DAY 90 tablet 3  . benazepril (LOTENSIN) 40 MG tablet TAKE 1 TABLET EVERY DAY 90 tablet 3  . carvedilol (COREG) 6.25 MG tablet TAKE 1 TABLET TWICE DAILY 180 tablet 3  . doxazosin (CARDURA) 8 MG tablet TAKE 1 TABLET EVERY DAY 90 tablet 3  . finasteride (PROSCAR) 5 MG tablet TAKE 1 TABLET EVERY DAY 90 tablet 3  . gabapentin (NEURONTIN) 300 MG capsule TAKE 1 CAPSULE THREE TIMES  DAILY 270 capsule 3  . Magnesium 500 MG TABS Take by mouth.    . MULTIPLE VITAMIN PO Take by mouth.    . sildenafil (VIAGRA) 100 MG tablet Take by mouth.    . triamterene-hydrochlorothiazide (MAXZIDE-25) 37.5-25 MG tablet TAKE 1/2 TABLET EVERY DAY 45 tablet 3  . warfarin (COUMADIN) 1 MG tablet Take by mouth.    . warfarin (COUMADIN) 4 MG tablet TAKE 1 TABLET ONE TIME DAILY  AT  6PM 90 tablet 3  . warfarin (COUMADIN) 5 MG tablet TAKE 1 TABLET EVERY DAY 90 tablet 3  . ALPRAZolam (XANAX) 0.25 MG tablet Take 1 tablet (0.25 mg total) by mouth every 6 (six) hours as needed for anxiety. (Patient not taking: Reported on 10/27/2017) 55 tablet 2   No current facility-administered medications on file prior to visit.    There are no Patient Instructions on file for this visit. No Follow-up on file.  KIMBERLY A STEGMAYER, PA-C

## 2017-11-20 IMAGING — CR DG CHEST 2V
1 series · 2 of 2 positions shown · non-contrast
Comparison: Lungs are mildly hyperexpanded.

CLINICAL DATA: Productive cough with sore throat weakness and
chills. Fever for 1 week.

EXAM:
CHEST  2 VIEW

[Series 1: dg chest 2 view · 0.14mm/px · 2 of 2 slices shown]
[im 1/2]
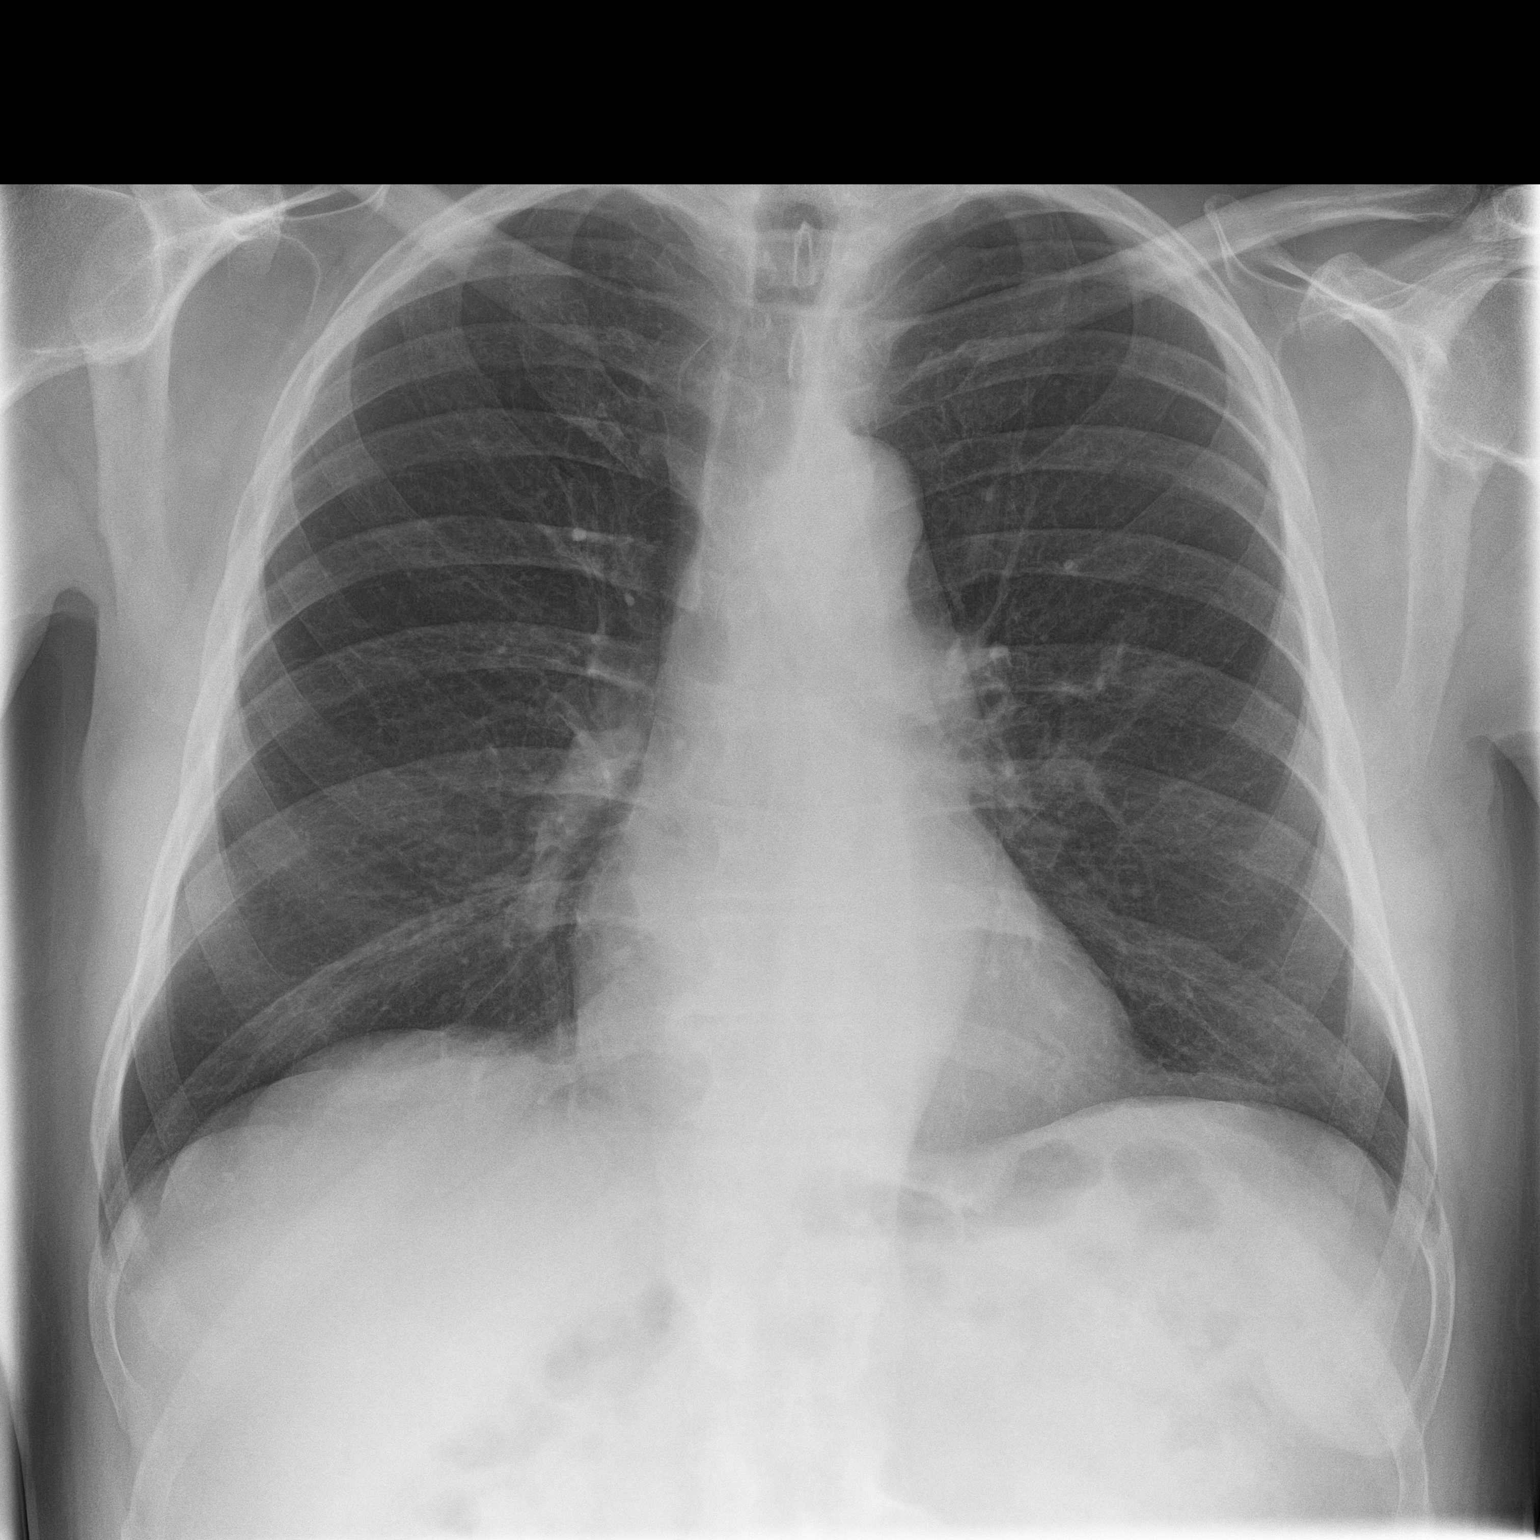
[im 2/2]
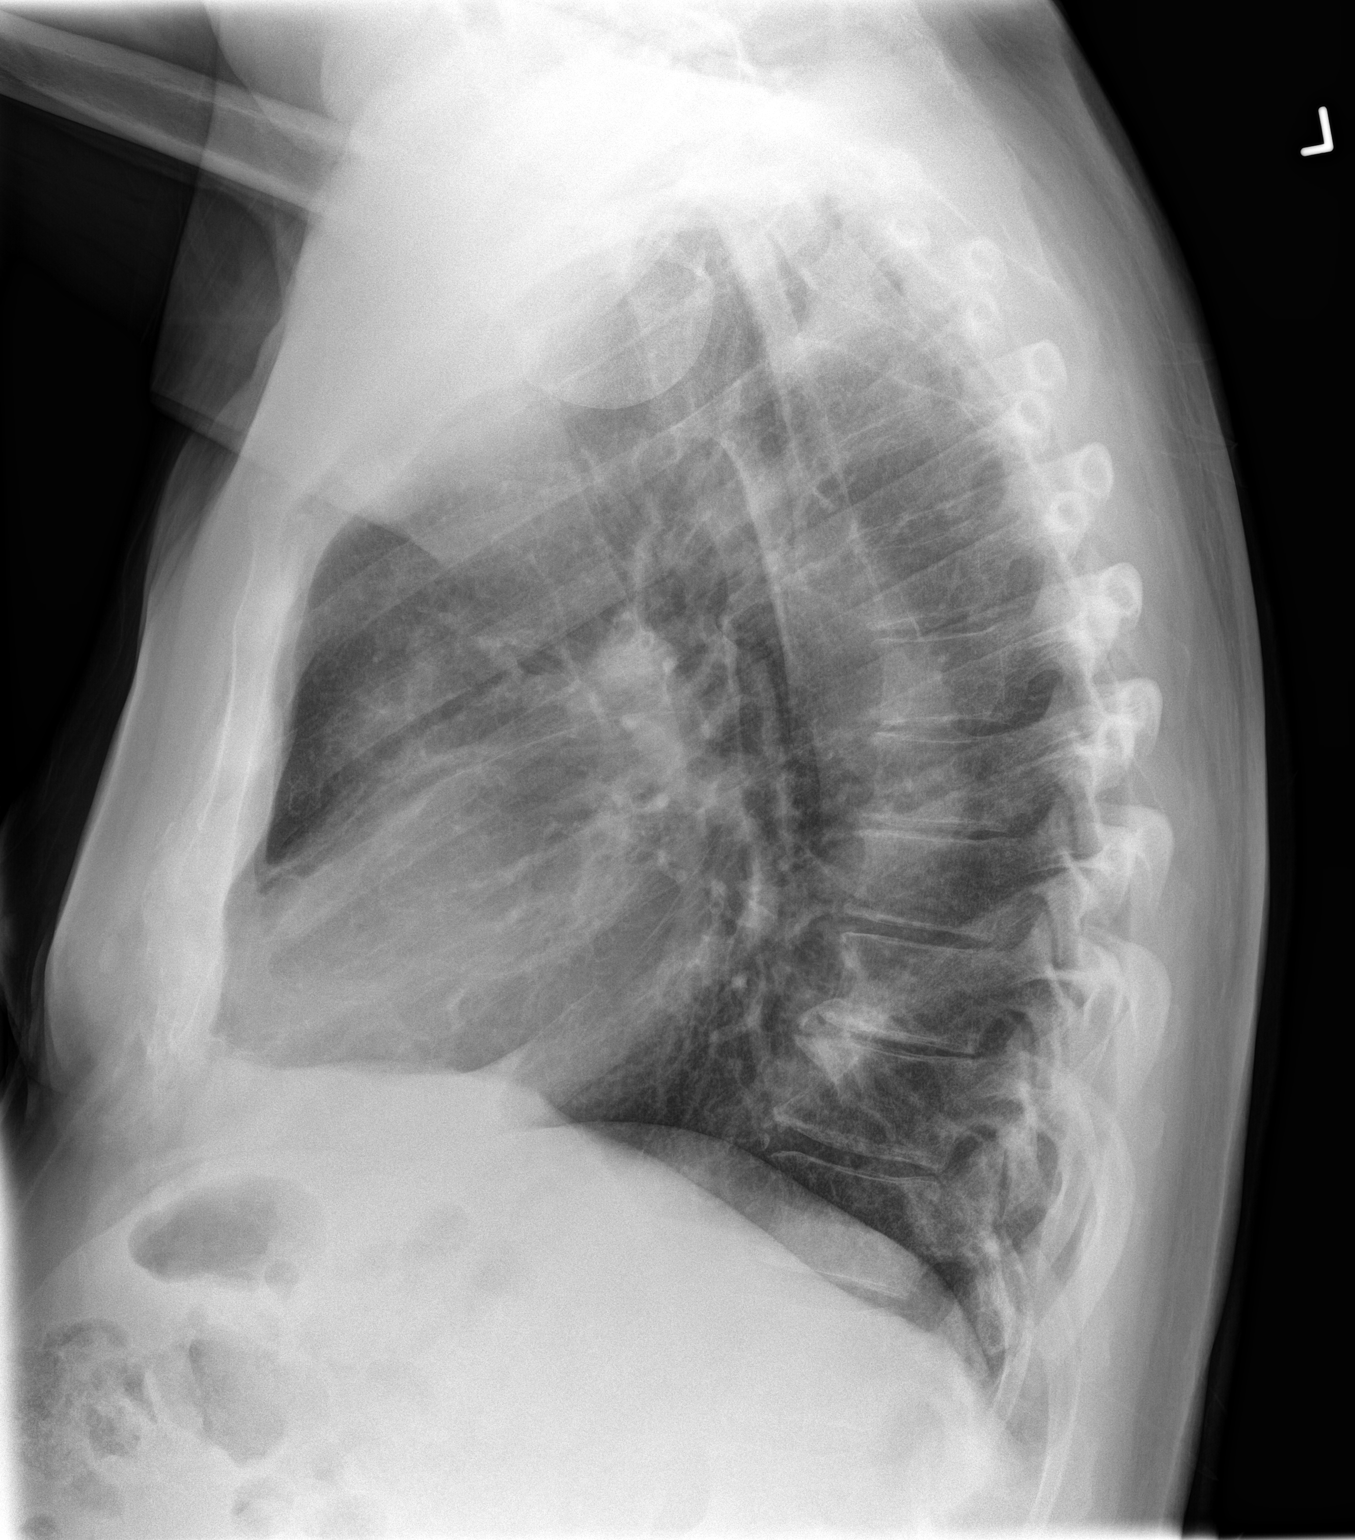

[2 of 2 positions shown; findings below may reference images not displayed]

The lungs are clear
wiithout focal pneumonia, edema, pneumothorax or pleural effusion.
The cardiopericardial silhouette is within normal limits for size.
The visualized bony structures of the thorax are intact.
FINDINGS: The heart size and mediastinal contours are within normal limits.
Both lungs are clear. The visualized skeletal structures are
unremarkable.
IMPRESSION: No active cardiopulmonary disease.

## 2017-11-22 IMAGING — CR DG CHEST 2V
1 series · 2 of 2 positions shown · non-contrast
Comparison: April 11, 2016

CLINICAL DATA: Shortness of Breath

EXAM:
CHEST  2 VIEW

[Series 1: w chest pa · 0.14mm/px · 2 of 2 slices shown]
[im 1/2]
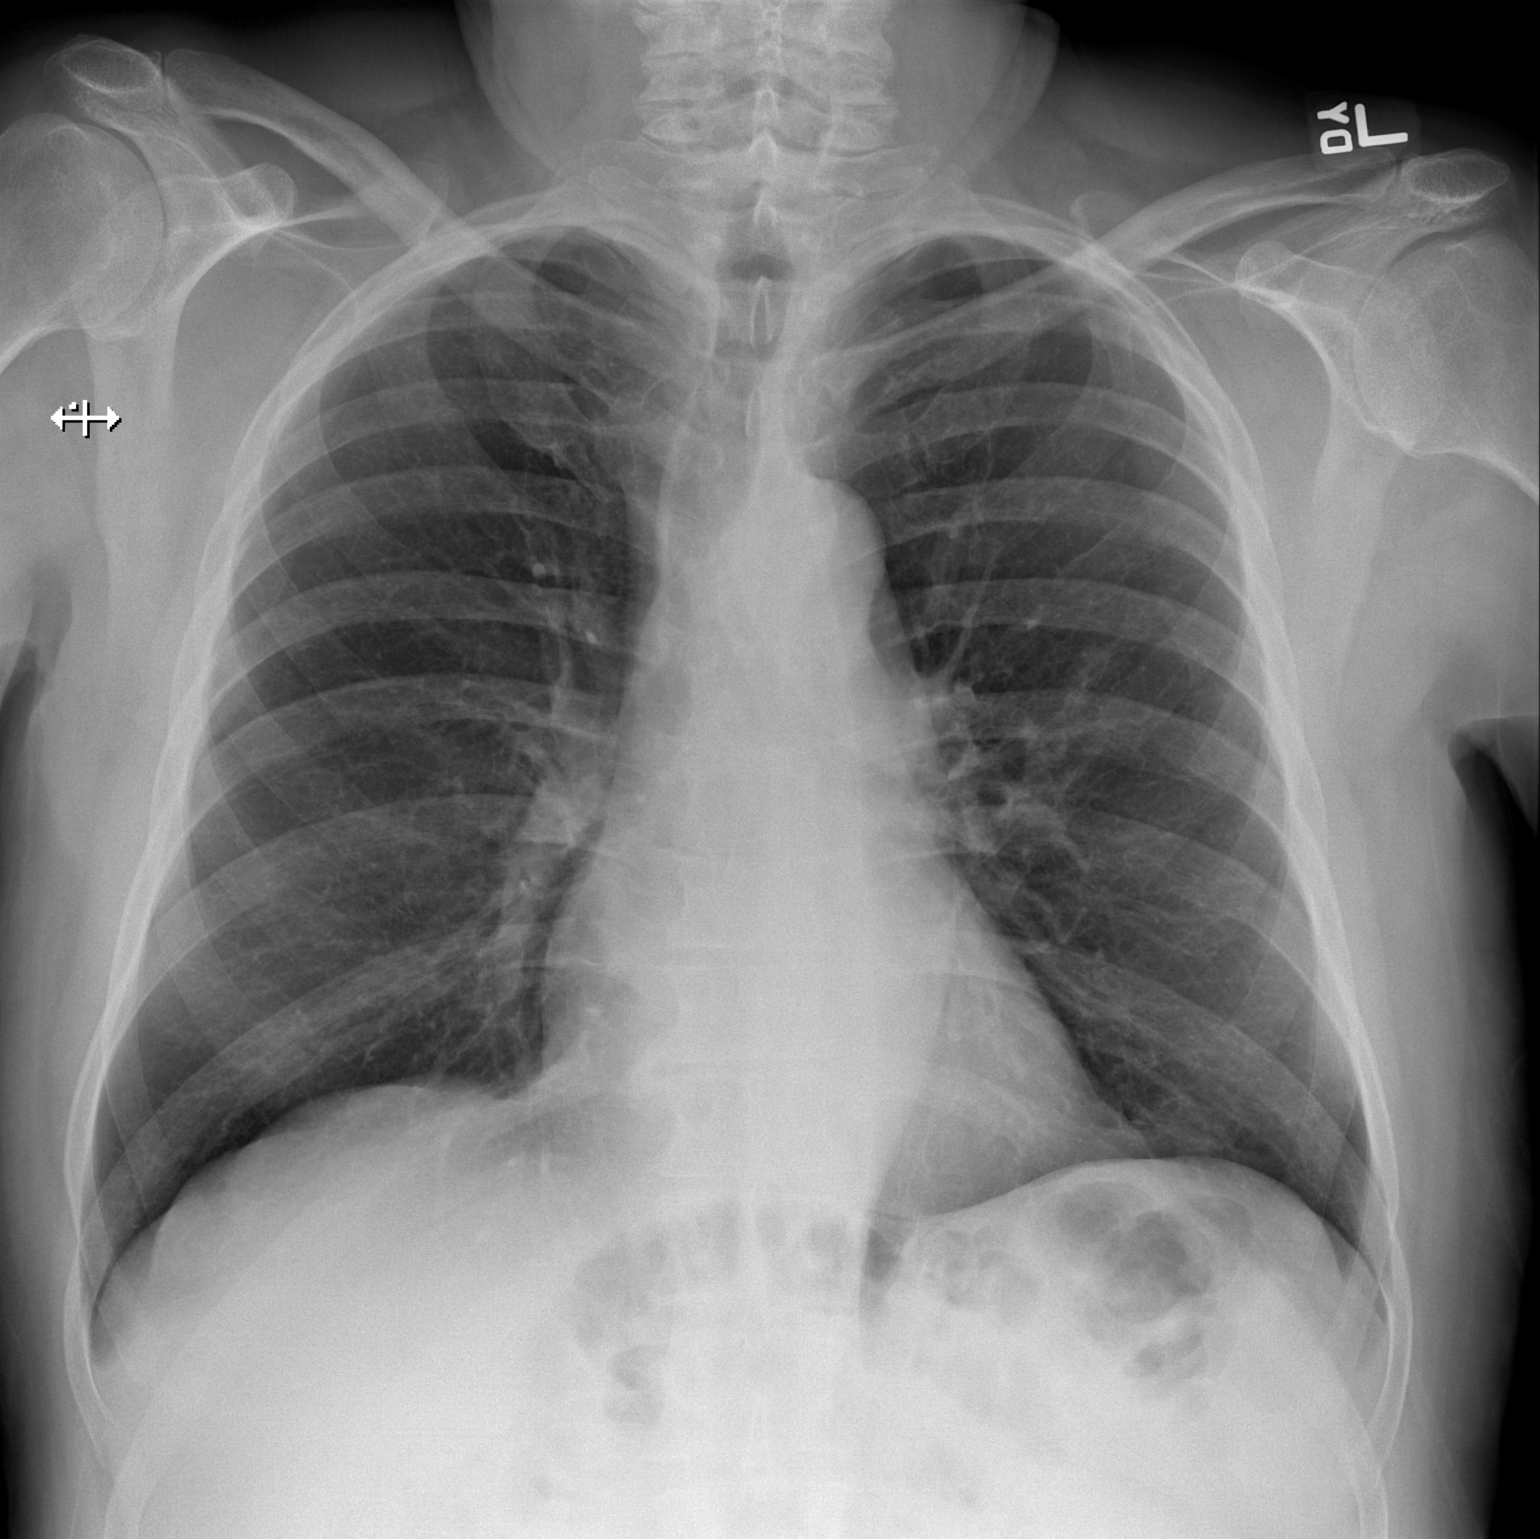
[im 2/2]
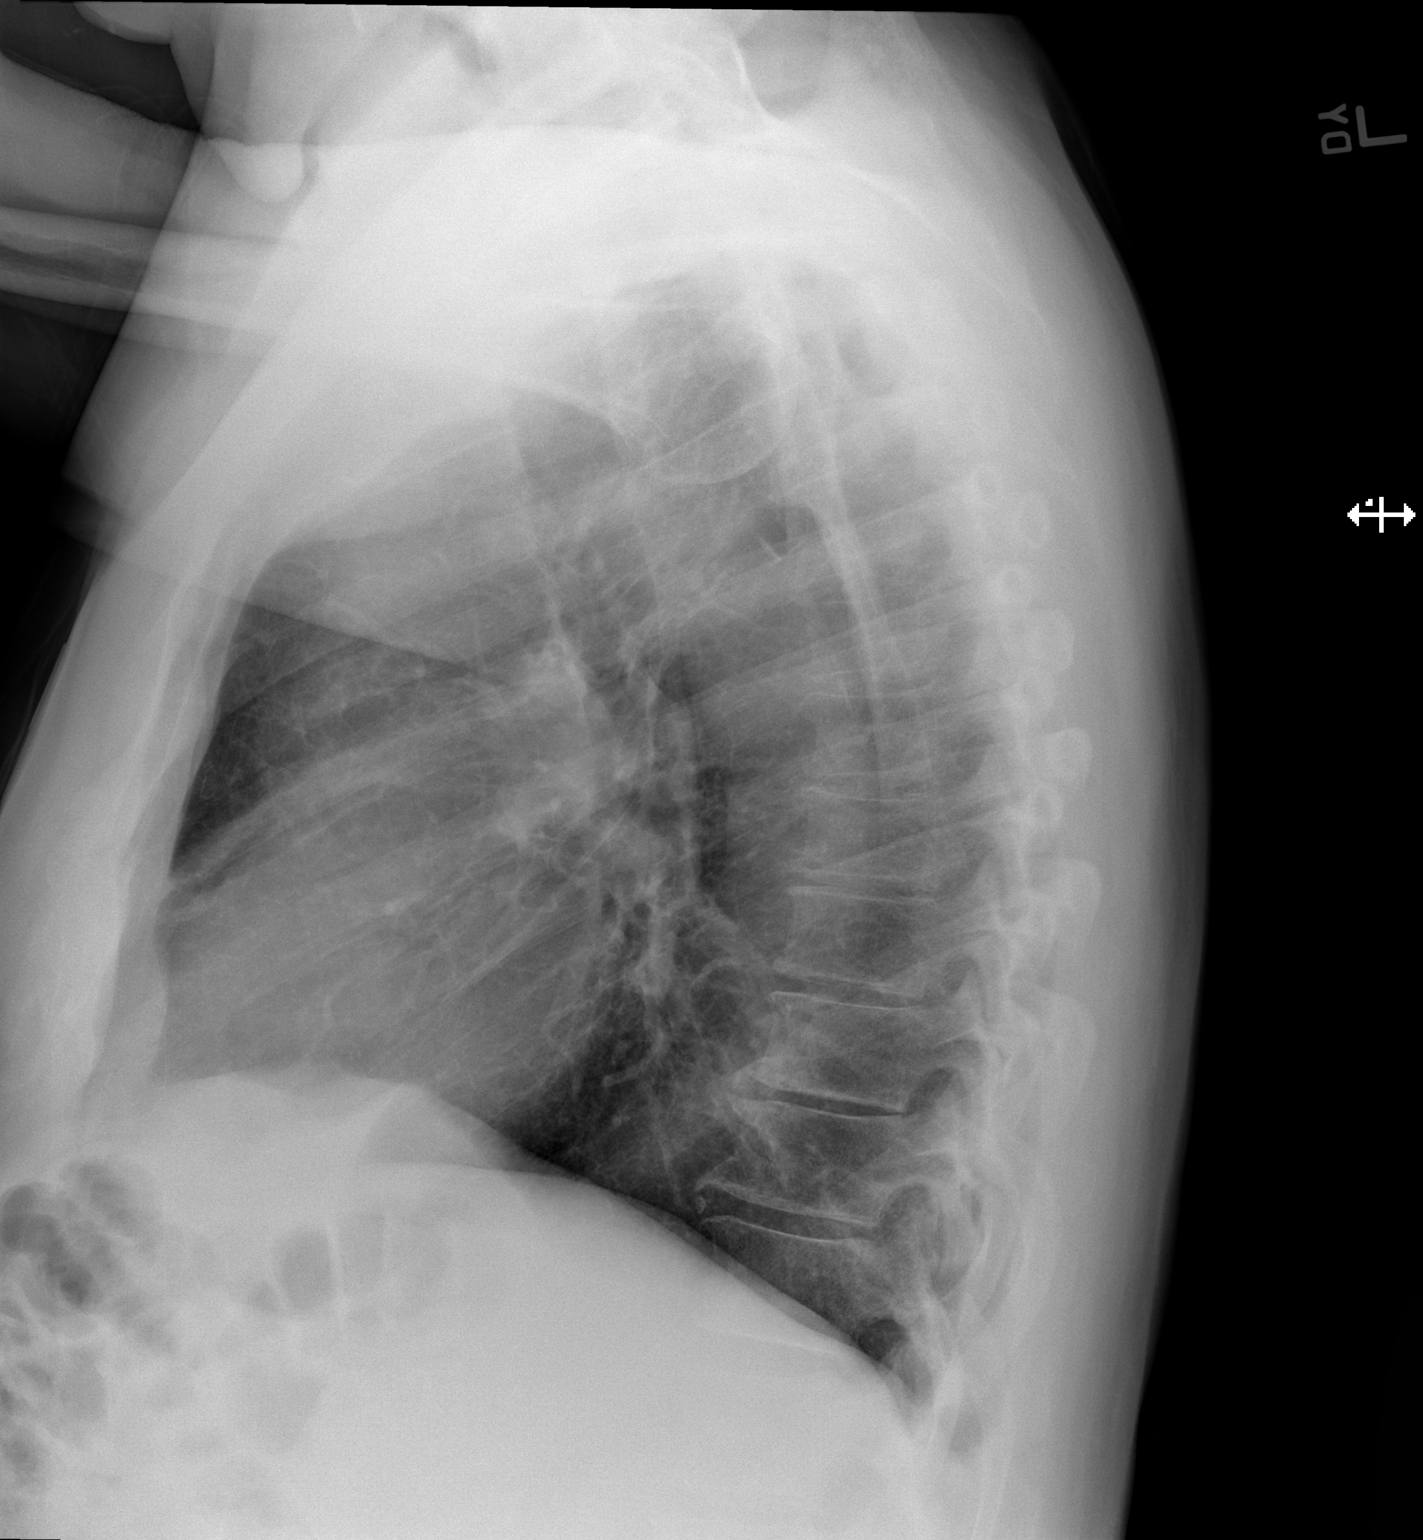

[2 of 2 positions shown; findings below may reference images not displayed]

FINDINGS: Lungs are clear. Heart size and pulmonary vascularity are normal. No
adenopathy. There is degenerative change in the lower thoracic spine
region.
IMPRESSION: No edema or consolidation.

## 2017-11-24 DIAGNOSIS — M5416 Radiculopathy, lumbar region: Secondary | ICD-10-CM | POA: Diagnosis not present

## 2017-11-24 DIAGNOSIS — L819 Disorder of pigmentation, unspecified: Secondary | ICD-10-CM | POA: Diagnosis not present

## 2017-11-24 DIAGNOSIS — M5136 Other intervertebral disc degeneration, lumbar region: Secondary | ICD-10-CM | POA: Diagnosis not present

## 2017-11-25 ENCOUNTER — Ambulatory Visit (INDEPENDENT_AMBULATORY_CARE_PROVIDER_SITE_OTHER): Payer: Medicare HMO

## 2017-11-25 DIAGNOSIS — I482 Chronic atrial fibrillation, unspecified: Secondary | ICD-10-CM

## 2017-11-25 LAB — POCT INR
INR: 2.7
PT: 32.1

## 2017-11-25 NOTE — Patient Instructions (Signed)
Description   Dx: Atrial Fibrillation ( I48.2) Continue alternating 4mg  and 5mg  daily.  Recheck in four weeks.

## 2017-12-07 ENCOUNTER — Ambulatory Visit: Payer: Self-pay | Admitting: Family Medicine

## 2017-12-09 DIAGNOSIS — I420 Dilated cardiomyopathy: Secondary | ICD-10-CM | POA: Diagnosis not present

## 2017-12-09 DIAGNOSIS — I10 Essential (primary) hypertension: Secondary | ICD-10-CM | POA: Diagnosis not present

## 2017-12-09 DIAGNOSIS — I952 Hypotension due to drugs: Secondary | ICD-10-CM | POA: Diagnosis not present

## 2017-12-09 DIAGNOSIS — I482 Chronic atrial fibrillation: Secondary | ICD-10-CM | POA: Diagnosis not present

## 2017-12-10 ENCOUNTER — Ambulatory Visit (INDEPENDENT_AMBULATORY_CARE_PROVIDER_SITE_OTHER): Payer: Medicare HMO | Admitting: Family Medicine

## 2017-12-10 ENCOUNTER — Other Ambulatory Visit: Payer: Self-pay

## 2017-12-10 VITALS — BP 118/72 | HR 60 | Temp 98.0°F | Resp 16 | Wt 203.0 lb

## 2017-12-10 DIAGNOSIS — I482 Chronic atrial fibrillation, unspecified: Secondary | ICD-10-CM

## 2017-12-10 DIAGNOSIS — I10 Essential (primary) hypertension: Secondary | ICD-10-CM | POA: Diagnosis not present

## 2017-12-10 DIAGNOSIS — M5416 Radiculopathy, lumbar region: Secondary | ICD-10-CM | POA: Diagnosis not present

## 2017-12-10 DIAGNOSIS — M2560 Stiffness of unspecified joint, not elsewhere classified: Secondary | ICD-10-CM | POA: Diagnosis not present

## 2017-12-10 DIAGNOSIS — M6281 Muscle weakness (generalized): Secondary | ICD-10-CM | POA: Diagnosis not present

## 2017-12-10 DIAGNOSIS — M9979 Connective tissue and disc stenosis of intervertebral foramina of abdomen and other regions: Secondary | ICD-10-CM | POA: Diagnosis not present

## 2017-12-10 DIAGNOSIS — M5442 Lumbago with sciatica, left side: Secondary | ICD-10-CM | POA: Diagnosis not present

## 2017-12-10 MED ORDER — OXYCODONE HCL 5 MG PO CAPS: 5.0000 mg | ORAL_CAPSULE | Freq: Four times a day (QID) | ORAL | 0 refills | Status: DC | PRN

## 2017-12-10 NOTE — Progress Notes (Signed)
Casey Gallegos  MRN: [id number] DOB: [birth year]  Subjective:  HPI  The patient is an 83 year old male who presents for follow  Up of chronic health.  He was last seen for this on 09/07/17.    Hyperglycemia-the patient had A1C done on 09/08/17 and it was 5.7 with diet control.    Hypertension-the patient had a visit with cardiology yesterday for hypotension.  The patient has had low blood pressure for the last 5 weeks associated with dyspnea and dizziness.   The patient was instructed to decrease his diuretic on the last visit.  Since that time the readings he has been getting have been better.    Patient Active Problem List   Diagnosis Date Noted  . Chronic venous insufficiency 11/11/2017  . Lymphedema 11/11/2017  . Pain in limb 10/27/2017  . A-fib ([place]) 03/02/2015  . Deep vein thrombosis ([place]) 03/02/2015  . Essential (primary) hypertension 03/02/2015  . Combined fat and carbohydrate induced hyperlipemia 03/02/2015  . Heart valve disease 03/02/2015  . Allergic rhinitis 02/28/2015  . Absolute anemia 02/28/2015  . 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 Back pain, chronic 02/28/2015  . Sex counseling 02/28/2015  . Narrowing of intervertebral disc space 02/28/2015  . Acute thromboembolism of deep veins of lower extremity ([place]) 02/28/2015  . ED (erectile dysfunction) of organic origin 02/28/2015  . Abnormal prostate specific antigen 02/28/2015  . BP (high blood pressure) 02/28/2015  . Lipomatosis 02/28/2015  . Malaise and fatigue 02/28/2015  . Generalized hyperhidrosis 02/28/2015  . Arthritis, degenerative 02/28/2015  . Adiposity 02/28/2015  . AF (paroxysmal atrial fibrillation) ([place]) 02/28/2015  . Chronic prostatitis 02/28/2015  . Spinal stenosis 02/28/2015  . Atypical pneumonia 02/28/2015    Past Medical History:  Diagnosis Date  . A-fib ([place])   . Anemia   . DVT (deep venous thrombosis) ([facility])    Monitor with PT/INR. Target 2.0 - 3.0   . Elevated PSA   . H/O  degenerative disc disease   . Hypertension   . Lipomatosis   . Night sweats   . Obesity     Social History   Socioeconomic History  . Marital status: Married    Spouse name: Not on file  . Number of children: Not on file  . Years of education: Not on file  . Highest education level: Not on file  Occupational History  . Not on file  Social Needs  . Financial resource strain: Not on file  . Food insecurity:    Worry: Not on file    Inability: Not on file  . Transportation needs:    Medical: Not on file    Non-medical: Not on file  Tobacco Use  . Smoking status: Former Smoker    Last attempt to quit: 08/26/1971    Years since quitting: 46.3  . Smokeless tobacco: Never Used  . Tobacco comment: Smoked for about 20 years.  Substance and Sexual Activity  . Alcohol use: Yes    Comment: Occasional  . Drug use: No  . Sexual activity: Not on file  Lifestyle  . Physical activity:    Days per week: Not on file    Minutes per session: Not on file  . Stress: Not on file  Relationships  . Social connections:    Talks on phone: Not on file    Gets together: Not on file    Attends religious service: Not on file    Active member of club or organization: Not on  file    Attends meetings of clubs or organizations: Not on file    Relationship status: Not on file  . Intimate partner violence:    Fear of current or ex partner: Not on file    Emotionally abused: Not on file    Physically abused: Not on file    Forced sexual activity: Not on file  Other Topics Concern  . Not on file  Social History Narrative  . Not on file    Outpatient Encounter Medications as of 12/10/2017  Medication Sig Note  . ALPRAZolam (XANAX) 0.25 MG tablet Take 1 tablet (0.25 mg total) by mouth every 6 (six) hours as needed for anxiety.   Marland Kitchen amLODipine (NORVASC) 5 MG tablet TAKE 1 TABLET EVERY DAY   . benazepril (LOTENSIN) 40 MG tablet TAKE 1 TABLET EVERY DAY   . carvedilol (COREG) 6.25 MG tablet TAKE 1  TABLET TWICE DAILY   . doxazosin (CARDURA) 8 MG tablet TAKE 1 TABLET EVERY DAY   . finasteride (PROSCAR) 5 MG tablet TAKE 1 TABLET EVERY DAY   . gabapentin (NEURONTIN) 300 MG capsule TAKE 1 CAPSULE THREE TIMES DAILY   . Magnesium 500 MG TABS Take by mouth.   . MULTIPLE VITAMIN PO Take by mouth. 02/28/2015: Received from: [facility]  . sildenafil (VIAGRA) 100 MG tablet Take by mouth. 02/28/2015: Medication taken as needed. Samples given Received from: [facility]  . triamterene-hydrochlorothiazide (MAXZIDE-25) 37.5-25 MG tablet TAKE 1/2 TABLET EVERY DAY   . warfarin (COUMADIN) 1 MG tablet Take by mouth. 02/28/2015: Received from: [facility]  . warfarin (COUMADIN) 4 MG tablet TAKE 1 TABLET ONE TIME DAILY  AT  6PM   . warfarin (COUMADIN) 5 MG tablet TAKE 1 TABLET EVERY DAY    No facility-administered encounter medications on file as of 12/10/2017.     Allergies  Allergen Reactions  . Cefdinir Nausea Only    hypersensitivity to smell  . Doxycycline     Sun sensitivity  . Prednisone   . Sertraline Other (See Comments)    Hallucinations     Review of Systems  Constitutional: Negative.   Eyes: Negative.   Respiratory: Negative.   Cardiovascular: Negative.   Musculoskeletal: Positive for back pain.  Neurological: Positive for sensory change.       Pt says he has intermittent numbness of feet.  Endo/Heme/Allergies: Negative.   Psychiatric/Behavioral: Negative.     Objective:  BP 118/72 (BP Location: Right Arm, Patient Position: Sitting, Cuff Size: Normal)   Pulse 60   Temp 98 F (36.7 C) (Oral)   Resp 16   Wt 203 lb (92.1 kg)   BMI 29.98 kg/m   Physical Exam  Constitutional: He is oriented to person, place, and time and well-developed, well-nourished, and in no distress.  HENT:  Head: Normocephalic and atraumatic.  Eyes: Conjunctivae are normal. No scleral icterus.  Neck: No thyromegaly present.  Cardiovascular: Normal rate,  regular rhythm, normal heart sounds and intact distal pulses.  Pulmonary/Chest: Effort normal.  Lymphadenopathy:    He has no cervical adenopathy.  Neurological: He is alert and oriented to person, place, and time. Gait normal. GCS score is 15.  Skin: Skin is warm and dry.  Psychiatric: Mood, memory, affect and judgment normal.    Assessment and Plan :  Afib HTN Chronic Back Pain/ LS DDD Refill Oxycodone 5mg  ,#100  I have done the exam and reviewed the chart and it is accurate to the best of my knowledge.  Development worker, community has been used and  any errors in dictation or transcription are unintentional. Miguel Aschoff M.D. [facility]

## 2017-12-15 ENCOUNTER — Other Ambulatory Visit: Payer: Self-pay

## 2017-12-15 ENCOUNTER — Telehealth: Payer: Self-pay

## 2017-12-15 NOTE — Telephone Encounter (Signed)
ok 

## 2017-12-15 NOTE — Telephone Encounter (Signed)
Patient needs his oxycodone changed from capsules to tablets and sent to [facility].

## 2017-12-16 DIAGNOSIS — M5416 Radiculopathy, lumbar region: Secondary | ICD-10-CM | POA: Diagnosis not present

## 2017-12-16 MED ORDER — OXYCODONE HCL 5 MG PO TABA: 5.0000 mg | ORAL_TABLET | Freq: Four times a day (QID) | ORAL | 0 refills | Status: DC | PRN

## 2017-12-18 ENCOUNTER — Other Ambulatory Visit: Payer: Self-pay | Admitting: Family Medicine

## 2017-12-18 NOTE — Telephone Encounter (Signed)
Medication is Oxycodone.

## 2017-12-18 NOTE — Telephone Encounter (Signed)
Spoke with pharmacy and pharmacist said there was nothing wrong with the rx. Pt advised.

## 2017-12-18 NOTE — Telephone Encounter (Signed)
Pt states his mail order pharmacy says there is a problem with the RX  And we need to cll [phone]  Pt is requesting we call him back once we have reach out to the pharmacy.    Pt is out of medication.

## 2017-12-22 DIAGNOSIS — M5416 Radiculopathy, lumbar region: Secondary | ICD-10-CM | POA: Diagnosis not present

## 2017-12-24 ENCOUNTER — Telehealth: Payer: Self-pay

## 2017-12-24 ENCOUNTER — Other Ambulatory Visit: Payer: Self-pay | Admitting: Emergency Medicine

## 2017-12-24 DIAGNOSIS — M5136 Other intervertebral disc degeneration, lumbar region: Secondary | ICD-10-CM

## 2017-12-24 DIAGNOSIS — M5416 Radiculopathy, lumbar region: Secondary | ICD-10-CM | POA: Diagnosis not present

## 2017-12-24 MED ORDER — OXYCODONE HCL 5 MG PO TABS
5.0000 mg | ORAL_TABLET | ORAL | 0 refills | Status: DC | PRN
Start: 2017-12-24 — End: 2019-04-18

## 2017-12-24 NOTE — Telephone Encounter (Signed)
[facility] called wanted to verify that we wanted this brand of Oxycodone (abuse deter) sent to pharmacy. I told her that we had issues with pt insurance not coving the capsules of the 5 mg and were sending in the tablets and could only be 30 at a time.  Needs the generic oxycodone tablets 5 mg sent to pharmacy. Please verify this is the correct one before sending in. They needs this sent ASAP so they can get it out to pt. Thanks.

## 2017-12-24 NOTE — Telephone Encounter (Signed)
LM for pt to CB and schedule AWV. Pt will be due after 01/13/18. -MM

## 2017-12-28 DIAGNOSIS — M5416 Radiculopathy, lumbar region: Secondary | ICD-10-CM | POA: Diagnosis not present

## 2017-12-30 ENCOUNTER — Ambulatory Visit: Payer: Self-pay

## 2017-12-30 ENCOUNTER — Ambulatory Visit (INDEPENDENT_AMBULATORY_CARE_PROVIDER_SITE_OTHER): Payer: Medicare HMO

## 2017-12-30 DIAGNOSIS — I482 Chronic atrial fibrillation, unspecified: Secondary | ICD-10-CM

## 2017-12-30 LAB — POCT INR
INR: 2.6
PT: 31.6

## 2017-12-30 NOTE — Patient Instructions (Signed)
Description   Dx: Atrial Fibrillation ( I48.2) Continue alternating 4mg  and 5mg  daily.  Recheck in four weeks.

## 2017-12-31 NOTE — Telephone Encounter (Signed)
Pt is scheduled on 03/10/18 by Con Memos.

## 2018-01-01 DIAGNOSIS — M5416 Radiculopathy, lumbar region: Secondary | ICD-10-CM | POA: Diagnosis not present

## 2018-01-04 ENCOUNTER — Telehealth (INDEPENDENT_AMBULATORY_CARE_PROVIDER_SITE_OTHER): Payer: Self-pay | Admitting: Vascular Surgery

## 2018-01-06 DIAGNOSIS — M5416 Radiculopathy, lumbar region: Secondary | ICD-10-CM | POA: Diagnosis not present

## 2018-01-11 DIAGNOSIS — M5416 Radiculopathy, lumbar region: Secondary | ICD-10-CM | POA: Diagnosis not present

## 2018-01-13 DIAGNOSIS — M5416 Radiculopathy, lumbar region: Secondary | ICD-10-CM | POA: Diagnosis not present

## 2018-01-19 DIAGNOSIS — M5416 Radiculopathy, lumbar region: Secondary | ICD-10-CM | POA: Diagnosis not present

## 2018-01-26 ENCOUNTER — Other Ambulatory Visit: Payer: Self-pay | Admitting: Urology

## 2018-01-26 DIAGNOSIS — M5416 Radiculopathy, lumbar region: Secondary | ICD-10-CM | POA: Diagnosis not present

## 2018-01-28 DIAGNOSIS — M5416 Radiculopathy, lumbar region: Secondary | ICD-10-CM | POA: Diagnosis not present

## 2018-01-29 ENCOUNTER — Ambulatory Visit (INDEPENDENT_AMBULATORY_CARE_PROVIDER_SITE_OTHER): Payer: Medicare HMO | Admitting: Vascular Surgery

## 2018-01-29 ENCOUNTER — Ambulatory Visit (INDEPENDENT_AMBULATORY_CARE_PROVIDER_SITE_OTHER): Payer: Medicare HMO | Admitting: Emergency Medicine

## 2018-01-29 DIAGNOSIS — I482 Chronic atrial fibrillation, unspecified: Secondary | ICD-10-CM

## 2018-01-29 LAB — POCT INR
INR: 2.1 (ref 2.0–3.0)
PT: 25.2

## 2018-01-29 NOTE — Patient Instructions (Signed)
Description   Dx: Atrial Fibrillation ( I48.2) Continue alternating 4mg  and 5mg  daily.  Recheck in four weeks.

## 2018-02-01 DIAGNOSIS — M5416 Radiculopathy, lumbar region: Secondary | ICD-10-CM | POA: Diagnosis not present

## 2018-02-09 DIAGNOSIS — M5416 Radiculopathy, lumbar region: Secondary | ICD-10-CM | POA: Diagnosis not present

## 2018-02-11 DIAGNOSIS — M5416 Radiculopathy, lumbar region: Secondary | ICD-10-CM | POA: Diagnosis not present

## 2018-03-10 ENCOUNTER — Ambulatory Visit: Payer: Medicare HMO

## 2018-03-10 ENCOUNTER — Ambulatory Visit (INDEPENDENT_AMBULATORY_CARE_PROVIDER_SITE_OTHER): Payer: Medicare HMO | Admitting: Family Medicine

## 2018-03-10 ENCOUNTER — Ambulatory Visit (INDEPENDENT_AMBULATORY_CARE_PROVIDER_SITE_OTHER): Payer: Medicare HMO

## 2018-03-10 VITALS — BP 114/62 | HR 66 | Temp 98.7°F | Ht 69.0 in | Wt 196.8 lb

## 2018-03-10 DIAGNOSIS — Z0000 Encounter for general adult medical examination without abnormal findings: Secondary | ICD-10-CM | POA: Diagnosis not present

## 2018-03-10 DIAGNOSIS — I482 Chronic atrial fibrillation, unspecified: Secondary | ICD-10-CM

## 2018-03-10 LAB — POCT INR
INR: 2.1 (ref 2.0–3.0)
PT: 25.3

## 2018-03-10 NOTE — Patient Instructions (Signed)
Description   Dx: Atrial Fibrillation ( I48.2) Continue alternating 4mg  and 5mg  daily.  Recheck in four weeks.

## 2018-03-10 NOTE — Progress Notes (Signed)
Subjective:   Casey Gallegos is a 83 y.o. male who presents for Medicare Annual/Subsequent preventive examination.  Review of Systems:  N/A  Cardiac Risk Factors include: advanced age (>3men, >39 women);hypertension;male gender     Objective:    Vitals: BP 114/62 (BP Location: Right Arm)   Pulse 66   Temp 98.7 F (37.1 C) (Oral)   Ht 5\' 9"  (1.753 m)   Wt 196 lb 12.8 oz (89.3 kg)   BMI 29.06 kg/m   Body mass index is 29.06 kg/m.  Advanced Directives 03/10/2018 01/13/2017 04/13/2016 01/11/2016 10/18/2015  Does Patient Have a Medical Advance Directive? Yes Yes No Yes Yes  Type of Paramedic of [place];Living will Living will;Healthcare Power of [place];Living will  Copy of [place] in Chart? No - copy requested No - copy requested - - -  Would patient like information on creating a medical advance directive? - - Yes - Educational materials given - -    Tobacco Social History   Tobacco Use  Smoking Status Former Smoker  . Last attempt to quit: 08/26/1971  . Years since quitting: 46.5  Smokeless Tobacco Never Used  Tobacco Comment   Smoked for about 20 years.     Counseling given: Not Answered Comment: Smoked for about 20 years.   Clinical Intake:  Pre-visit preparation completed: Yes  Pain : No/denies pain Pain Score: 0-No pain     Nutritional Status: BMI 25 -29 Overweight Nutritional Risks: None Diabetes: No  How often do you need to have someone help you when you read instructions, pamphlets, or other written materials from your doctor or pharmacy?: 1 - Never  Interpreter Needed?: No  Information entered by :: [facility], LPN  Past Medical History:  Diagnosis Date  . A-fib ([place])   . Anemia   . DVT (deep venous thrombosis) ([facility])    Monitor with PT/INR. Target 2.0 - 3.0   . Elevated PSA   . H/O degenerative disc disease   . Hypertension   . Lipomatosis   . Night sweats   .  Obesity    Past Surgical History:  Procedure Laterality Date  . Bilateral Radical Keratotomy Bilateral 1997  . COLONOSCOPY WITH PROPOFOL N/A 01/11/2016   Procedure: COLONOSCOPY WITH PROPOFOL;  Surgeon: Manya Silvas, MD;  Location: [facility] ENDOSCOPY;  Service: Endoscopy;  Laterality: N/A; repeat 3 years, tubular adenoma  . deep vein thrombosis    . DENTAL SURGERY     Patient had implants  . GUM SURGERY    . L4 - S1 decompression Left    Left-sided  . PROSTATE BIOPSY    . TONSILLECTOMY AND ADENOIDECTOMY  age 32   Family History  Problem Relation Age of Onset  . Cancer Mother        secondary to cancinoma of the jaw from her dipping snuff.  . Heart attack Father   . CAD Father   . Hypertension Sister   . Diabetes Son        Type I diabetes  . Prostate cancer Neg Hx   . Kidney cancer Neg Hx    Social History   Socioeconomic History  . Marital status: Married    Spouse name: Not on file  . Number of children: 3  . Years of education: Not on file  . Highest education level: Associate degree: occupational, Hotel manager, or vocational program  Occupational History  . Occupation: retired  Scientific laboratory technician  .  Financial resource strain: Not hard at all  . Food insecurity:    Worry: Never true    Inability: Never true  . Transportation needs:    Medical: No    Non-medical: No  Tobacco Use  . Smoking status: Former Smoker    Last attempt to quit: 08/26/1971    Years since quitting: 46.5  . Smokeless tobacco: Never Used  . Tobacco comment: Smoked for about 20 years.  Substance and Sexual Activity  . Alcohol use: Not Currently  . Drug use: No  . Sexual activity: Not on file  Lifestyle  . Physical activity:    Days per week: Not on file    Minutes per session: Not on file  . Stress: Not at all  Relationships  . Social connections:    Talks on phone: Not on file    Gets together: Not on file    Attends religious service: Not on file    Active member of club or organization: Not  on file    Attends meetings of clubs or organizations: Not on file    Relationship status: Not on file  Other Topics Concern  . Not on file  Social History Narrative  . Not on file    Outpatient Encounter Medications as of 03/10/2018  Medication Sig  . amLODipine (NORVASC) 5 MG tablet TAKE 1 TABLET EVERY DAY  . benazepril (LOTENSIN) 40 MG tablet TAKE 1 TABLET EVERY DAY  . carvedilol (COREG) 6.25 MG tablet TAKE 1 TABLET TWICE DAILY  . CORTIZONE-10 DIABETICS SKIN 1 % lotion Apply 1 application topically daily.   Marland Kitchen doxazosin (CARDURA) 8 MG tablet TAKE 1 TABLET EVERY DAY  . finasteride (PROSCAR) 5 MG tablet TAKE 1 TABLET EVERY DAY  . gabapentin (NEURONTIN) 300 MG capsule TAKE 1 CAPSULE THREE TIMES DAILY (Patient taking differently: TAKE 1 CAPSULE THREE TIMES DAILY (1 in AM and 2 in PM))  . Magnesium 500 MG TABS Take by mouth daily.   . MULTIPLE VITAMIN PO Take by mouth daily.   Marland Kitchen oxyCODONE (OXY IR/ROXICODONE) 5 MG immediate release tablet Take 1 tablet (5 mg total) by mouth every 4 (four) hours as needed for severe pain.  Marland Kitchen triamterene-hydrochlorothiazide (MAXZIDE-25) 37.5-25 MG tablet TAKE 1/2 TABLET EVERY DAY  . warfarin (COUMADIN) 4 MG tablet TAKE 1 TABLET ONE TIME DAILY  AT  6PM (Patient taking differently: TAKE 1 TABLET ONE TIME DAILY  AT  6PM every other day alternating with 5 mg.)  . warfarin (COUMADIN) 5 MG tablet TAKE 1 TABLET EVERY DAY (Patient taking differently: TAKE 1 TABLET EVERY OTHER DAY alternating with 4 mg)  . ALPRAZolam (XANAX) 0.25 MG tablet Take 1 tablet (0.25 mg total) by mouth every 6 (six) hours as needed for anxiety. (Patient not taking: Reported on 03/10/2018)  . OxyCODONE HCl, Abuse Deter, (OXAYDO) 5 MG TABA Take 5 mg by mouth every 6 (six) hours as needed. (Patient not taking: Reported on 03/10/2018)  . sildenafil (VIAGRA) 100 MG tablet Take by mouth as needed.   . warfarin (COUMADIN) 1 MG tablet Take by mouth.   No facility-administered encounter medications on  file as of 03/10/2018.     Activities of Daily Living In your present state of health, do you have any difficulty performing the following activities: 03/10/2018  Hearing? Y  Comment Pt declines use of hearing aids.   Vision? N  Difficulty concentrating or making decisions? N  Walking or climbing stairs? N  Dressing or bathing? N  Doing errands,  shopping? N  Preparing Food and eating ? N  Using the Toilet? N  In the past six months, have you accidently leaked urine? N  Do you have problems with loss of bowel control? N  Managing your Medications? N  Managing your Finances? N  Housekeeping or managing your Housekeeping? N  Some recent data might be hidden    Patient Care Team: Jerrol Banana., MD as PCP - General (Family Medicine) Hollice Espy, MD as Consulting Physician (Urology) Corey Skains, MD as Consulting Physician (Cardiology) Leandrew Koyanagi, MD as Referring Physician (Ophthalmology) Barbette Merino, NP as Nurse Practitioner (Nurse Practitioner)   Assessment:   This is a routine wellness examination for [place].  Exercise Activities and Dietary recommendations Current Exercise Habits: Structured exercise class, Type of exercise: strength training/weights;Other - see comments;treadmill;walking;stretching(stationary bike), Time (Minutes): 60, Frequency (Times/Week): 5, Weekly Exercise (Minutes/Week): 300, Intensity: Mild, Exercise limited by: None identified  Goals    . DIET - INCREASE WATER INTAKE     Recommend increasing water intake to 6-8 glasses a day.        Fall Risk Fall Risk  03/10/2018 01/13/2017 04/15/2016 10/18/2015  Falls in the past year? No No No No   Is the patient's home free of loose throw rugs in walkways, pet beds, electrical cords, etc?   yes      Grab bars in the bathroom? no      Handrails on the stairs?   no      Adequate lighting?   yes  Timed Get Up and Go Performed: N/A  Depression Screen PHQ 2/9 Scores 03/10/2018  01/13/2017 04/15/2016 10/18/2015  PHQ - 2 Score 2 2 1  0  PHQ- 9 Score 5 5 4  -    Cognitive Function: Pt declined screening today.   MMSE - Mini Mental State Exam 04/15/2016  Orientation to time 4  Orientation to Place 5  Registration 3  Attention/ Calculation 5  Recall 1  Language- name 2 objects 2  Language- repeat 1  Language- follow 3 step command 3  Language- read & follow direction 1  Write a sentence 1  Copy design 1  Total score 27     6CIT Screen 01/13/2017  What Year? 0 points  What month? 0 points  What time? 0 points  Count back from 20 0 points  Months in reverse 0 points  Repeat phrase 2 points  Total Score 2    Immunization History  Administered Date(s) Administered  . Influenza, High Dose Seasonal PF 05/11/2015, 05/21/2016, 05/27/2017  . Pneumococcal Conjugate-13 01/09/2014  . Pneumococcal Polysaccharide-23 08/08/2004  . Td 10/25/2008    Qualifies for Shingles Vaccine? Due for Shingles vaccine. Declined my offer to administer today. Education has been provided regarding the importance of this vaccine. Pt has been advised to call her insurance company to determine her out of pocket expense. Advised she may also receive this vaccine at her local pharmacy or Health Dept. Verbalized acceptance and understanding.  Screening Tests Health Maintenance  Topic Date Due  . INFLUENZA VACCINE  03/25/2018  . TETANUS/TDAP  10/26/2018  . PNA vac Low Risk Adult  Completed   Cancer Screenings: Lung: Low Dose CT Chest recommended if Age 27-80 years, 30 pack-year currently smoking OR have quit w/in 15years. Patient does not qualify. Colorectal: Up to date  Additional Screenings:  Hepatitis C Screening: N/A      Plan:  I have personally reviewed and addressed the Medicare Annual Wellness questionnaire and  have noted the following in the patient's chart:  A. Medical and social history B. Use of alcohol, tobacco or illicit drugs  C. Current medications and  supplements D. Functional ability and status E.  Nutritional status F.  Physical activity G. Advance directives H. List of other physicians I.  Hospitalizations, surgeries, and ER visits in previous 12 months J.  [place] such as hearing and vision if needed, cognitive and depression L. Referrals and appointments - none  In addition, I have reviewed and discussed with patient certain preventive protocols, quality metrics, and best practice recommendations. A written personalized care plan for preventive services as well as general preventive health recommendations were provided to patient.  See attached scanned questionnaire for additional information.   Signed,  Fabio Neighbors, LPN Nurse Health Advisor   Nurse Recommendations: None.

## 2018-03-10 NOTE — Patient Instructions (Addendum)
[name] , Thank you for taking time to come for your Medicare Wellness Visit. I appreciate your ongoing commitment to your health goals. Please review the following plan we discussed and let me know if I can assist you in the future.   Screening recommendations/referrals: Colonoscopy: Up to date Recommended yearly ophthalmology/optometry visit for glaucoma screening and checkup Recommended yearly dental visit for hygiene and checkup  Vaccinations: Influenza vaccine: Up to date Pneumococcal vaccine: Up to date Tdap vaccine: Up to date Shingles vaccine: Pt declines today.     Advanced directives: Please bring a copy of your POA (Power of Attorney) and/or Living Will to your next appointment.   Conditions/risks identified: Recommend increasing water intake to 6-8 glasses a day.   Next appointment: 10:00 AM today for PT/INR.  Preventive Care 22 Years and Older, Male Preventive care refers to lifestyle choices and visits with your health care provider that can promote health and wellness. What does preventive care include?  A yearly physical exam. This is also called an annual well check.  Dental exams once or twice a year.  Routine eye exams. Ask your health care provider how often you should have your eyes checked.  Personal lifestyle choices, including:  Daily care of your teeth and gums.  Regular physical activity.  Eating a healthy diet.  Avoiding tobacco and drug use.  Limiting alcohol use.  Practicing safe sex.  Taking low doses of aspirin every day.  Taking vitamin and mineral supplements as recommended by your health care provider. What happens during an annual well check? The services and screenings done by your health care provider during your annual well check will depend on your age, overall health, lifestyle risk factors, and family history of disease. Counseling  Your health care provider may ask you questions about your:  Alcohol use.  Tobacco  use.  Drug use.  Emotional well-being.  Home and relationship well-being.  Sexual activity.  Eating habits.  History of falls.  Memory and ability to understand (cognition).  Work and work Statistician. Screening  You may have the following tests or measurements:  Height, weight, and BMI.  Blood pressure.  Lipid and cholesterol levels. These may be checked every 5 years, or more frequently if you are over 18 years old.  Skin check.  Lung cancer screening. You may have this screening every year starting at age 59 if you have a 30-pack-year history of smoking and currently smoke or have quit within the past 15 years.  Fecal occult blood test (FOBT) of the stool. You may have this test every year starting at age 65.  Flexible sigmoidoscopy or colonoscopy. You may have a sigmoidoscopy every 5 years or a colonoscopy every 10 years starting at age 65.  Prostate cancer screening. Recommendations will vary depending on your family history and other risks.  Hepatitis C blood test.  Hepatitis B blood test.  Sexually transmitted disease (STD) testing.  Diabetes screening. This is done by checking your blood sugar (glucose) after you have not eaten for a while (fasting). You may have this done every 1-3 years.  Abdominal aortic aneurysm (AAA) screening. You may need this if you are a current or former smoker.  Osteoporosis. You may be screened starting at age 52 if you are at high risk. Talk with your health care provider about your test results, treatment options, and if necessary, the need for more tests. Vaccines  Your health care provider may recommend certain vaccines, such as:  Influenza vaccine.  This is recommended every year.  Tetanus, diphtheria, and acellular pertussis (Tdap, Td) vaccine. You may need a Td booster every 10 years.  Zoster vaccine. You may need this after age 67.  Pneumococcal 13-valent conjugate (PCV13) vaccine. One dose is recommended after age  71.  Pneumococcal polysaccharide (PPSV23) vaccine. One dose is recommended after age 86. Talk to your health care provider about which screenings and vaccines you need and how often you need them. This information is not intended to replace advice given to you by your health care provider. Make sure you discuss any questions you have with your health care provider. Document Released: 09/07/2015 Document Revised: 04/30/2016 Document Reviewed: 06/12/2015 Elsevier Interactive Patient Education  2017 [place] Prevention in the Home Falls can cause injuries. They can happen to people of all ages. There are many things you can do to make your home safe and to help prevent falls. What can I do on the outside of my home?  Regularly fix the edges of walkways and driveways and fix any cracks.  Remove anything that might make you trip as you walk through a door, such as a raised step or threshold.  Trim any bushes or trees on the path to your home.  Use bright outdoor lighting.  Clear any walking paths of anything that might make someone trip, such as rocks or tools.  Regularly check to see if handrails are loose or broken. Make sure that both sides of any steps have handrails.  Any raised decks and porches should have guardrails on the edges.  Have any leaves, snow, or ice cleared regularly.  Use sand or salt on walking paths during winter.  Clean up any spills in your garage right away. This includes oil or grease spills. What can I do in the bathroom?  Use night lights.  Install grab bars by the toilet and in the tub and shower. Do not use towel bars as grab bars.  Use non-skid mats or decals in the tub or shower.  If you need to sit down in the shower, use a plastic, non-slip stool.  Keep the floor dry. Clean up any water that spills on the floor as soon as it happens.  Remove soap buildup in the tub or shower regularly.  Attach bath mats securely with double-sided  non-slip rug tape.  Do not have throw rugs and other things on the floor that can make you trip. What can I do in the bedroom?  Use night lights.  Make sure that you have a light by your bed that is easy to reach.  Do not use any sheets or blankets that are too big for your bed. They should not hang down onto the floor.  Have a firm chair that has side arms. You can use this for support while you get dressed.  Do not have throw rugs and other things on the floor that can make you trip. What can I do in the kitchen?  Clean up any spills right away.  Avoid walking on wet floors.  Keep items that you use a lot in easy-to-reach places.  If you need to reach something above you, use a strong step stool that has a grab bar.  Keep electrical cords out of the way.  Do not use floor polish or wax that makes floors slippery. If you must use wax, use non-skid floor wax.  Do not have throw rugs and other things on the floor that can make  you trip. What can I do with my stairs?  Do not leave any items on the stairs.  Make sure that there are handrails on both sides of the stairs and use them. Fix handrails that are broken or loose. Make sure that handrails are as long as the stairways.  Check any carpeting to make sure that it is firmly attached to the stairs. Fix any carpet that is loose or worn.  Avoid having throw rugs at the top or bottom of the stairs. If you do have throw rugs, attach them to the floor with carpet tape.  Make sure that you have a light switch at the top of the stairs and the bottom of the stairs. If you do not have them, ask someone to add them for you. What else can I do to help prevent falls?  Wear shoes that:  Do not have high heels.  Have rubber bottoms.  Are comfortable and fit you well.  Are closed at the toe. Do not wear sandals.  If you use a stepladder:  Make sure that it is fully opened. Do not climb a closed stepladder.  Make sure that both  sides of the stepladder are locked into place.  Ask someone to hold it for you, if possible.  Clearly mark and make sure that you can see:  Any grab bars or handrails.  First and last steps.  Where the edge of each step is.  Use tools that help you move around (mobility aids) if they are needed. These include:  Canes.  Walkers.  Scooters.  Crutches.  Turn on the lights when you go into a dark area. Replace any light bulbs as soon as they burn out.  Set up your furniture so you have a clear path. Avoid moving your furniture around.  If any of your floors are uneven, fix them.  If there are any pets around you, be aware of where they are.  Review your medicines with your doctor. Some medicines can make you feel dizzy. This can increase your chance of falling. Ask your doctor what other things that you can do to help prevent falls. This information is not intended to replace advice given to you by your health care provider. Make sure you discuss any questions you have with your health care provider. Document Released: 06/07/2009 Document Revised: 01/17/2016 Document Reviewed: 09/15/2014 Elsevier Interactive Patient Education  2017 [employer].

## 2018-03-14 NOTE — Progress Notes (Signed)
INR adjusted. Depression ok.

## 2018-04-07 ENCOUNTER — Ambulatory Visit (INDEPENDENT_AMBULATORY_CARE_PROVIDER_SITE_OTHER): Payer: Medicare HMO

## 2018-04-07 DIAGNOSIS — I482 Chronic atrial fibrillation, unspecified: Secondary | ICD-10-CM

## 2018-04-07 LAB — POCT INR
INR: 1.6 — AB (ref 2.0–3.0)
PT: 19.1

## 2018-04-07 NOTE — Patient Instructions (Signed)
Increase to 5mg  daily recheck in one week

## 2018-04-12 ENCOUNTER — Ambulatory Visit (INDEPENDENT_AMBULATORY_CARE_PROVIDER_SITE_OTHER): Payer: Medicare HMO | Admitting: Family Medicine

## 2018-04-12 VITALS — BP 124/72 | HR 60 | Temp 97.6°F | Resp 16 | Wt 196.0 lb

## 2018-04-12 DIAGNOSIS — I482 Chronic atrial fibrillation, unspecified: Secondary | ICD-10-CM

## 2018-04-12 DIAGNOSIS — M9979 Connective tissue and disc stenosis of intervertebral foramina of abdomen and other regions: Secondary | ICD-10-CM

## 2018-04-12 DIAGNOSIS — I10 Essential (primary) hypertension: Secondary | ICD-10-CM

## 2018-04-12 LAB — POCT INR
INR: 1.9 — AB (ref 2.0–3.0)
PT: 22.4

## 2018-04-12 NOTE — Patient Instructions (Signed)
Description   Dx: Atrial Fibrillation ( I48.2) 10 mg for 2 days, then resume 5mg  daily recheck in 2 weeks

## 2018-04-12 NOTE — Progress Notes (Signed)
Casey Gallegos  MRN: [id number] DOB: Jun 28, 1935  Subjective:  HPI   The patient is an 83 year old male who presents for follow up of chronic health.  He was last seen for this on 12/10/17.   He feels well.  He stays very active tending his garden. Hypertension-The patient has had good readings on his last couple of visit.   BP Readings from Last 3 Encounters:  04/12/18 124/72  03/10/18 114/62  12/10/17 118/72   Atrial Fibrillation-The patient is due to have his INR checked today.    Patient Active Problem List   Diagnosis Date Noted  . Chronic venous insufficiency 11/11/2017  . Lymphedema 11/11/2017  . Pain in limb 10/27/2017  . A-fib ([place]) 03/02/2015  . Deep vein thrombosis ([place]) 03/02/2015  . Essential (primary) hypertension 03/02/2015  . Combined fat and carbohydrate induced hyperlipemia 03/02/2015  . Heart valve disease 03/02/2015  . Allergic rhinitis 02/28/2015  . Absolute anemia 02/28/2015  . 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 Back pain, chronic 02/28/2015  . Sex counseling 02/28/2015  . Narrowing of intervertebral disc space 02/28/2015  . Acute thromboembolism of deep veins of lower extremity ([place]) 02/28/2015  . ED (erectile dysfunction) of organic origin 02/28/2015  . Abnormal prostate specific antigen 02/28/2015  . BP (high blood pressure) 02/28/2015  . Lipomatosis 02/28/2015  . Malaise and fatigue 02/28/2015  . Generalized hyperhidrosis 02/28/2015  . Arthritis, degenerative 02/28/2015  . Adiposity 02/28/2015  . AF (paroxysmal atrial fibrillation) ([place]) 02/28/2015  . Chronic prostatitis 02/28/2015  . Spinal stenosis 02/28/2015  . Atypical pneumonia 02/28/2015    Past Medical History:  Diagnosis Date  . A-fib ([place])   . Anemia   . DVT (deep venous thrombosis) (HCC)    Monitor with PT/INR. Target 2.0 - 3.0   . Elevated PSA   . H/O degenerative disc disease   . Hypertension   . Lipomatosis   . Night sweats   . Obesity     Social History    Socioeconomic History  . Marital status: Married    Spouse name: Not on file  . Number of children: 3  . Years of education: Not on file  . Highest education level: Associate degree: occupational, Hotel manager, or vocational program  Occupational History  . Occupation: retired  Scientific laboratory technician  . Financial resource strain: Not hard at all  . Food insecurity:    Worry: Never true    Inability: Never true  . Transportation needs:    Medical: No    Non-medical: No  Tobacco Use  . Smoking status: Former Smoker    Last attempt to quit: 08/26/1971    Years since quitting: 46.6  . Smokeless tobacco: Never Used  . Tobacco comment: Smoked for about 20 years.  Substance and Sexual Activity  . Alcohol use: Not Currently  . Drug use: No  . Sexual activity: Not on file  Lifestyle  . Physical activity:    Days per week: Not on file    Minutes per session: Not on file  . Stress: Not at all  Relationships  . Social connections:    Talks on phone: Not on file    Gets together: Not on file    Attends religious service: Not on file    Active member of club or organization: Not on file    Attends meetings of clubs or organizations: Not on file    Relationship status: Not on file  . Intimate partner violence:  Fear of current or ex partner: Not on file    Emotionally abused: Not on file    Physically abused: Not on file    Forced sexual activity: Not on file  Other Topics Concern  . Not on file  Social History Narrative  . Not on file    Outpatient Encounter Medications as of 04/12/2018  Medication Sig Note  . amLODipine (NORVASC) 5 MG tablet TAKE 1 TABLET EVERY DAY   . benazepril (LOTENSIN) 40 MG tablet TAKE 1 TABLET EVERY DAY   . carvedilol (COREG) 6.25 MG tablet TAKE 1 TABLET TWICE DAILY   . CORTIZONE-10 DIABETICS SKIN 1 % lotion Apply 1 application topically daily.    Marland Kitchen doxazosin (CARDURA) 8 MG tablet TAKE 1 TABLET EVERY DAY   . finasteride (PROSCAR) 5 MG tablet TAKE 1 TABLET  EVERY DAY   . gabapentin (NEURONTIN) 300 MG capsule TAKE 1 CAPSULE THREE TIMES DAILY (Patient taking differently: TAKE 1 CAPSULE THREE TIMES DAILY (1 in AM and 2 in PM))   . Magnesium 500 MG TABS Take by mouth daily.    . MULTIPLE VITAMIN PO Take by mouth daily.  02/28/2015: Received from: [facility]  . oxyCODONE (OXY IR/ROXICODONE) 5 MG immediate release tablet Take 1 tablet (5 mg total) by mouth every 4 (four) hours as needed for severe pain.   Marland Kitchen triamterene-hydrochlorothiazide (MAXZIDE-25) 37.5-25 MG tablet TAKE 1/2 TABLET EVERY DAY   . warfarin (COUMADIN) 1 MG tablet Take by mouth. 02/28/2015: Received from: [facility]  . warfarin (COUMADIN) 4 MG tablet TAKE 1 TABLET ONE TIME DAILY  AT  6PM (Patient taking differently: TAKE 1 TABLET ONE TIME DAILY  AT  6PM every other day alternating with 5 mg.)   . warfarin (COUMADIN) 5 MG tablet TAKE 1 TABLET EVERY DAY (Patient taking differently: TAKE 1 TABLET EVERY OTHER DAY alternating with 4 mg)   . [DISCONTINUED] ALPRAZolam (XANAX) 0.25 MG tablet Take 1 tablet (0.25 mg total) by mouth every 6 (six) hours as needed for anxiety. (Patient not taking: Reported on 03/10/2018)   . [DISCONTINUED] OxyCODONE HCl, Abuse Deter, (OXAYDO) 5 MG TABA Take 5 mg by mouth every 6 (six) hours as needed. (Patient not taking: Reported on 03/10/2018)   . [DISCONTINUED] sildenafil (VIAGRA) 100 MG tablet Take by mouth as needed.  02/28/2015: Medication taken as needed. Samples given Received from: [facility]   No facility-administered encounter medications on file as of 04/12/2018.     Allergies  Allergen Reactions  . Cefdinir Nausea Only    hypersensitivity to smell  . Doxycycline     Sun sensitivity  . Prednisone   . Sertraline Other (See Comments)    Hallucinations     Review of Systems  Constitutional: Negative for fever and malaise/fatigue.  Respiratory: Negative for cough, shortness of breath and wheezing.    Cardiovascular: Negative for chest pain, palpitations, orthopnea, claudication and leg swelling.    Objective:  BP 124/72 (BP Location: Right Arm, Patient Position: Sitting, Cuff Size: Normal)   Pulse 60   Temp 97.6 F (36.4 C) (Oral)   Resp 16   Wt 196 lb (88.9 kg)   BMI 28.94 kg/m   Physical Exam  Constitutional: He is oriented to person, place, and time and well-developed, well-nourished, and in no distress.  HENT:  Head: Normocephalic and atraumatic.  Right Ear: External ear normal.  Left Ear: External ear normal.  Nose: Nose normal.  Eyes: Conjunctivae are normal. No scleral icterus.  Neck: No thyromegaly present.  Cardiovascular: Normal rate, regular rhythm and normal heart sounds.  Pulmonary/Chest: Effort normal and breath sounds normal.  Abdominal: Soft. Bowel sounds are normal.  Musculoskeletal: He exhibits no edema.  Lymphadenopathy:    He has no cervical adenopathy.  Neurological: He is alert and oriented to person, place, and time. Gait normal. GCS score is 15.  Skin: Skin is warm and dry.  Psychiatric: Mood, memory, affect and judgment normal.    Assessment and Plan :  1. Chronic atrial fibrillation (HCC) 10mg  for 2 days then back to 5mg --INR 2 weeks. - POCT INR--1.9 2.LS DDD Chronic left foot drop.Pain controlled. 3.HTN  I have done the exam and reviewed the chart and it is accurate to the best of my knowledge. Development worker, community has been used and  any errors in dictation or transcription are unintentional. Miguel Aschoff M.D. [facility]

## 2018-04-28 ENCOUNTER — Ambulatory Visit (INDEPENDENT_AMBULATORY_CARE_PROVIDER_SITE_OTHER): Payer: Medicare HMO

## 2018-04-28 DIAGNOSIS — I482 Chronic atrial fibrillation, unspecified: Secondary | ICD-10-CM

## 2018-04-28 LAB — POCT INR
INR: 2.4 (ref 2.0–3.0)
PT: 28.9

## 2018-04-28 NOTE — Progress Notes (Deleted)
Continue 5mg  a day.  Recheck in three weeks.

## 2018-04-28 NOTE — Progress Notes (Signed)
Here for PT

## 2018-04-28 NOTE — Patient Instructions (Signed)
Continue 5mg  a day.  Recheck in three weeks.

## 2018-05-21 ENCOUNTER — Other Ambulatory Visit: Payer: Self-pay | Admitting: Family Medicine

## 2018-05-26 ENCOUNTER — Ambulatory Visit (INDEPENDENT_AMBULATORY_CARE_PROVIDER_SITE_OTHER): Payer: Medicare HMO

## 2018-05-26 VITALS — BP 124/70 | Ht 70.0 in | Wt 196.0 lb

## 2018-05-26 DIAGNOSIS — I4891 Unspecified atrial fibrillation: Secondary | ICD-10-CM

## 2018-05-26 DIAGNOSIS — I482 Chronic atrial fibrillation, unspecified: Secondary | ICD-10-CM

## 2018-05-26 DIAGNOSIS — Z23 Encounter for immunization: Secondary | ICD-10-CM

## 2018-05-26 LAB — POCT INR
INR: 2.4 (ref 2.0–3.0)
PT: 28.9

## 2018-05-26 NOTE — Patient Instructions (Signed)
Description   Dx: Atrial Fibrillation ( I48.2) Continue 5mg  a day.  Recheck in four weeks.

## 2018-05-26 NOTE — Progress Notes (Signed)
Patient here today for PT/INR and influenza vaccine.

## 2018-06-09 DIAGNOSIS — R9431 Abnormal electrocardiogram [ECG] [EKG]: Secondary | ICD-10-CM | POA: Diagnosis not present

## 2018-06-09 DIAGNOSIS — E782 Mixed hyperlipidemia: Secondary | ICD-10-CM | POA: Diagnosis not present

## 2018-06-09 DIAGNOSIS — I420 Dilated cardiomyopathy: Secondary | ICD-10-CM | POA: Diagnosis not present

## 2018-06-09 DIAGNOSIS — I482 Chronic atrial fibrillation, unspecified: Secondary | ICD-10-CM | POA: Diagnosis not present

## 2018-06-09 DIAGNOSIS — I10 Essential (primary) hypertension: Secondary | ICD-10-CM | POA: Diagnosis not present

## 2018-06-23 ENCOUNTER — Ambulatory Visit (INDEPENDENT_AMBULATORY_CARE_PROVIDER_SITE_OTHER): Payer: Medicare HMO

## 2018-06-23 DIAGNOSIS — I4891 Unspecified atrial fibrillation: Secondary | ICD-10-CM

## 2018-06-23 DIAGNOSIS — I824Y9 Acute embolism and thrombosis of unspecified deep veins of unspecified proximal lower extremity: Secondary | ICD-10-CM | POA: Diagnosis not present

## 2018-06-23 DIAGNOSIS — I482 Chronic atrial fibrillation, unspecified: Secondary | ICD-10-CM | POA: Diagnosis not present

## 2018-06-23 LAB — POCT INR: INR: 2.3 (ref 2.0–3.0)

## 2018-07-12 ENCOUNTER — Other Ambulatory Visit: Payer: Self-pay | Admitting: Family Medicine

## 2018-07-12 DIAGNOSIS — I10 Essential (primary) hypertension: Secondary | ICD-10-CM

## 2018-07-20 ENCOUNTER — Ambulatory Visit (INDEPENDENT_AMBULATORY_CARE_PROVIDER_SITE_OTHER): Payer: Medicare HMO

## 2018-07-20 DIAGNOSIS — I482 Chronic atrial fibrillation, unspecified: Secondary | ICD-10-CM

## 2018-07-20 LAB — POCT INR
INR: 2.2 (ref 2.0–3.0)
PT: 26

## 2018-07-20 NOTE — Patient Instructions (Signed)
Description   Dx: Atrial Fibrillation ( I48.2) Current Coumadin dose: 5mg  daily PT: 26.0  INR: 2.2 Today's Changes: NO CHANGE Recheck: 4 weeks

## 2018-08-04 ENCOUNTER — Other Ambulatory Visit: Payer: Self-pay | Admitting: Family Medicine

## 2018-08-04 DIAGNOSIS — I480 Paroxysmal atrial fibrillation: Secondary | ICD-10-CM

## 2018-08-24 ENCOUNTER — Ambulatory Visit (INDEPENDENT_AMBULATORY_CARE_PROVIDER_SITE_OTHER): Payer: Medicare HMO

## 2018-08-24 DIAGNOSIS — I4891 Unspecified atrial fibrillation: Secondary | ICD-10-CM

## 2018-08-24 DIAGNOSIS — I482 Chronic atrial fibrillation, unspecified: Secondary | ICD-10-CM | POA: Diagnosis not present

## 2018-08-24 LAB — POCT INR
INR: 2.9 (ref 2.0–3.0)
PT: 34.3

## 2018-08-24 NOTE — Patient Instructions (Addendum)
Description   Dx: Atrial Fibrillation ( I48.2) Current Coumadin dose: 5mg  daily. No changes. Recheck in 4 weeks.

## 2018-08-31 ENCOUNTER — Encounter: Payer: Self-pay | Admitting: Urology

## 2018-08-31 ENCOUNTER — Ambulatory Visit: Payer: Medicare HMO | Admitting: Urology

## 2018-08-31 VITALS — BP 128/84 | HR 65 | Ht 71.0 in | Wt 204.0 lb

## 2018-08-31 DIAGNOSIS — R3129 Other microscopic hematuria: Secondary | ICD-10-CM

## 2018-08-31 DIAGNOSIS — R339 Retention of urine, unspecified: Secondary | ICD-10-CM | POA: Diagnosis not present

## 2018-08-31 DIAGNOSIS — N401 Enlarged prostate with lower urinary tract symptoms: Secondary | ICD-10-CM

## 2018-08-31 DIAGNOSIS — N138 Other obstructive and reflux uropathy: Secondary | ICD-10-CM | POA: Diagnosis not present

## 2018-08-31 LAB — URINALYSIS, COMPLETE
Bilirubin, UA: NEGATIVE
Glucose, UA: NEGATIVE
Ketones, UA: NEGATIVE
LEUKOCYTES UA: NEGATIVE
NITRITE UA: NEGATIVE
Protein, UA: NEGATIVE
RBC, UA: NEGATIVE
Specific Gravity, UA: 1.015 (ref 1.005–1.030)
Urobilinogen, Ur: 0.2 mg/dL (ref 0.2–1.0)
pH, UA: 6 (ref 5.0–7.5)

## 2018-08-31 LAB — MICROSCOPIC EXAMINATION
Epithelial Cells (non renal): NONE SEEN /hpf (ref 0–10)
WBC UA: NONE SEEN /HPF (ref 0–5)

## 2018-08-31 LAB — BLADDER SCAN AMB NON-IMAGING

## 2018-08-31 NOTE — Progress Notes (Signed)
08/31/2018  3:27 PM   Casey Gallegos November 28, 1934 [id number]  Referring provider: Jerrol Banana., MD [address] [place], [place] [address]  Chief Complaint  Patient presents with  . Hematuria    HPI: 84 year old male with BPH, incomplete bladder emptying, history of elevated PSA, and microscopic hematuria who returns for routine 12 month follow up.  BPH with LUTS TRUS vol 72 cc in 2012. IPSS today as below, 9/2.  Mostly satisfied. PVR 107 cc today, previously was 40 cc. He remains on cardura 8 mg and finasteride 5 mg (since 06/2016).  He admits to emptying very well throughout the day but reports his first time of the day it is more difficult. He reports that 2-3 weeks ago he was experiencing difficulty starting stream and frequency that lasted 3-4 days. He started drinking cranberry juice and his symptoms ceased. Denied associated fevers.  ED  Not discussed today. Baseline ED.  He has previously used Viagra in the past (100 mg) to no avail. He found Cialsis most effective.  He remains uninterested in treatment at time.   History of elevated PSA Personal history of elevated PSA status post negative prostate biopsy in 05/2011 by Dr. Bernardo Heater.    Review of previous records indicate that his PSA was as high as 11.6 in May 2011. He was treated with a course of antibiotics and decreased his PSA to 4.5. His history of fluctuating PSA and has been as high as 13.    Most recent PSA was 5.5 (08/26/2017) which is down significantly from his prior of 12.1 (06/2016).  Microscopic hematuria Incidental microscopic hematuria visit 09/1016 (3-10 RBC) with associated dysuria which has resolved.  Urine cytology 09/1016 negative.    UAs since have resulted without blood multiple times.  Today returned the same.   He is on chronic Coumadin.  Smoked 1 ppd x 20 years, quit 40 year  IPSS    Row Name 08/31/18 1000         International Prostate Symptom Score   How often have you  had the sensation of not emptying your bladder?  Less than 1 in 5     How often have you had to urinate less than every two hours?  Less than half the time     How often have you found you stopped and started again several times when you urinated?  Less than half the time     How often have you found it difficult to postpone urination?  Less than half the time     How often have you had a weak urinary stream?  Less than 1 in 5 times     How often have you had to strain to start urination?  Not at All     How many times did you typically get up at night to urinate?  1 Time     Total IPSS Score  9       Quality of Life due to urinary symptoms   If you were to spend the rest of your life with your urinary condition just the way it is now how would you feel about that?  Mostly Satisfied        Score:  1-7 Mild 8-19 Moderate 20-35 Severe   PMH: Past Medical History:  Diagnosis Date  . A-fib ([place])   . Anemia   . DVT (deep venous thrombosis) (HCC)    Monitor with PT/INR. Target 2.0 - 3.0   . Elevated  PSA   . H/O degenerative disc disease   . Hypertension   . Lipomatosis   . Night sweats   . Obesity     Surgical History: Past Surgical History:  Procedure Laterality Date  . Bilateral Radical Keratotomy Bilateral 1997  . COLONOSCOPY WITH PROPOFOL N/A 01/11/2016   Procedure: COLONOSCOPY WITH PROPOFOL;  Surgeon: Manya Silvas, MD;  Location: [facility] ENDOSCOPY;  Service: Endoscopy;  Laterality: N/A; repeat 3 years, tubular adenoma  . deep vein thrombosis    . DENTAL SURGERY     Patient had implants  . GUM SURGERY    . L4 - S1 decompression Left    Left-sided  . PROSTATE BIOPSY    . TONSILLECTOMY AND ADENOIDECTOMY  age 28    Home Medications:  Allergies as of 08/31/2018      Reactions   Cefdinir Nausea Only   hypersensitivity to smell   Doxycycline    Sun sensitivity   Prednisone    Sertraline Other (See Comments)   Hallucinations       Medication List       Accurate  as of August 31, 2018  3:27 PM. Always use your most recent med list.        amLODipine 5 MG tablet Commonly known as:  NORVASC TAKE 1 TABLET EVERY DAY   benazepril 40 MG tablet Commonly known as:  LOTENSIN TAKE 1 TABLET EVERY DAY   carvedilol 6.25 MG tablet Commonly known as:  COREG TAKE 1 TABLET TWICE DAILY   CORTIZONE-10 DIABETICS SKIN 1 % lotion Generic drug:  hydrocortisone Apply 1 application topically daily.   doxazosin 8 MG tablet Commonly known as:  CARDURA TAKE 1 TABLET EVERY DAY   finasteride 5 MG tablet Commonly known as:  PROSCAR TAKE 1 TABLET EVERY DAY   gabapentin 300 MG capsule Commonly known as:  NEURONTIN TAKE 1 CAPSULE THREE TIMES DAILY (1 in AM and 2 in PM)   LUBRICANT EYE DROPS OP   Magnesium 500 MG Tabs Take by mouth daily.   MULTIPLE VITAMIN PO Take by mouth daily.   oxyCODONE 5 MG immediate release tablet Commonly known as:  Oxy IR/ROXICODONE Take 1 tablet (5 mg total) by mouth every 4 (four) hours as needed for severe pain.   triamterene-hydrochlorothiazide 37.5-25 MG tablet Commonly known as:  MAXZIDE-25 TAKE 1/2 TABLET EVERY DAY   warfarin 1 MG tablet Commonly known as:  COUMADIN Take as directed by the anticoagulation clinic. If you are unsure how to take this medication, talk to your nurse or doctor. Original instructions:  Take by mouth.   warfarin 5 MG tablet Commonly known as:  COUMADIN Take as directed by the anticoagulation clinic. If you are unsure how to take this medication, talk to your nurse or doctor. Original instructions:  TAKE 1 TABLET EVERY OTHER DAY alternating with 4 mg   warfarin 4 MG tablet Commonly known as:  COUMADIN Take as directed by the anticoagulation clinic. If you are unsure how to take this medication, talk to your nurse or doctor. Original instructions:  TAKE 1 TABLET ONE TIME DAILY  AT  6PM every other day alternating with 5 mg.       Allergies:  Allergies  Allergen Reactions  . Cefdinir  Nausea Only    hypersensitivity to smell  . Doxycycline     Sun sensitivity  . Prednisone   . Sertraline Other (See Comments)    Hallucinations     Family History: Family History  Problem Relation  Age of Onset  . Cancer Mother        secondary to cancinoma of the jaw from her dipping snuff.  . Heart attack Father   . CAD Father   . Hypertension Sister   . Diabetes Son        Type I diabetes  . Prostate cancer Neg Hx   . Kidney cancer Neg Hx     Social History:  reports that he quit smoking about 47 years ago. He has never used smokeless tobacco. He reports previous alcohol use. He reports that he does not use drugs.  ROS: UROLOGY Frequent Urination?: No Hard to postpone urination?: No Burning/pain with urination?: No Get up at night to urinate?: No Leakage of urine?: No Urine stream starts and stops?: No Trouble starting stream?: No Do you have to strain to urinate?: No Blood in urine?: No Urinary tract infection?: No Sexually transmitted disease?: No Injury to kidneys or bladder?: No Painful intercourse?: No Weak stream?: No Erection problems?: No Penile pain?: No  Gastrointestinal Nausea?: No Vomiting?: No Indigestion/heartburn?: No Diarrhea?: No Constipation?: No  Constitutional Fever: No Night sweats?: No Weight loss?: No Fatigue?: No  Skin Skin rash/lesions?: No Itching?: No  Eyes Blurred vision?: No Double vision?: No  Ears/Nose/Throat Sore throat?: No Sinus problems?: No  Hematologic/Lymphatic Swollen glands?: No Easy bruising?: No  Cardiovascular Leg swelling?: No Chest pain?: No  Respiratory Cough?: No Shortness of breath?: No  Endocrine Excessive thirst?: No  Musculoskeletal Back pain?: No Joint pain?: No  Neurological Headaches?: No Dizziness?: No  Psychologic Depression?: No Anxiety?: No  Physical Exam: BP 128/84 (BP Location: Left Arm, Patient Position: Sitting)   Pulse 65   Ht 5\' 11"  (1.803 m)   Wt 204  lb (92.5 kg)   BMI 28.45 kg/m   Constitutional:  Alert and oriented, No acute distress. Respiratory: Normal respiratory effort, no increased work of breathing. GI: Abdomen is soft, nontender, nondistended. GU: No CVA tenderness.  Rectal: Deferred given age Skin: No rashes, bruises or suspicious lesions. Neurologic: Grossly intact, no focal deficits, moving all 4 extremities. Psychiatric: Normal mood and affect.  Laboratory Data: Lab Results  Component Value Date   WBC 7.2 09/08/2017   HGB 14.0 09/08/2017   HCT 41.4 09/08/2017   MCV 93 09/08/2017   PLT 196 09/08/2017    Lab Results  Component Value Date   CREATININE 1.37 (H) 09/08/2017   Urinalysis: UA reviewed today, no evidence of blood. See Epic.  Imaging  Results for orders placed or performed in visit on 08/31/18  Microscopic Examination  Result Value Ref Range   WBC, UA None seen 0 - 5 /hpf   RBC, UA 0-2 0 - 2 /hpf   Epithelial Cells (non renal) None seen 0 - 10 /hpf   Casts Present (A) None seen /lpf   Cast Type Hyaline casts N/A   Mucus, UA Present (A) Not Estab.   Bacteria, UA Moderate (A) None seen/Few  Urinalysis, Complete  Result Value Ref Range   Specific Gravity, UA 1.015 1.005 - 1.030   pH, UA 6.0 5.0 - 7.5   Color, UA Yellow Yellow   Appearance Ur Clear Clear   Leukocytes, UA Negative Negative   Protein, UA Negative Negative/Trace   Glucose, UA Negative Negative   Ketones, UA Negative Negative   RBC, UA Negative Negative   Bilirubin, UA Negative Negative   Urobilinogen, Ur 0.2 0.2 - 1.0 mg/dL   Nitrite, UA Negative Negative   Microscopic Examination  See below:   BLADDER SCAN AMB NON-IMAGING  Result Value Ref Range   Scan Result 157ml     Assessment & Plan:   1. BPH with obstruction/lower urinary tract symptoms/ incomplete bladder emptying Stable on dual therapy with finasteride and Cardura PVR worsened today, but overall reasonable Will continue to monitor closely Return  2. History  of elevated PSA Repeat PSA last year with appropriate decline after starting finasteride. Will defer prostate cancer screening today based on agent comorbidities Consider PSA next visit pending overall health status  3. Microsocpic hematuria UA today revealed no evidence of blood Previously declined cysto, will continue to follow UA annually  Return in about 1 year (around 09/01/2019) for IPSS, PVR, UA, possible PSA.   [facility] [address], [place], [place] [address] [phone]  I have reviewed the above documentation for accuracy and completeness, and I agree with the above.   Hollice Espy, MD

## 2018-09-18 ENCOUNTER — Other Ambulatory Visit: Payer: Self-pay | Admitting: Family Medicine

## 2018-09-21 ENCOUNTER — Ambulatory Visit (INDEPENDENT_AMBULATORY_CARE_PROVIDER_SITE_OTHER): Payer: Medicare HMO | Admitting: *Deleted

## 2018-09-21 DIAGNOSIS — I482 Chronic atrial fibrillation, unspecified: Secondary | ICD-10-CM

## 2018-09-21 LAB — POCT INR
INR: 2.7 (ref 2.0–3.0)
PT: 32.2

## 2018-09-21 NOTE — Patient Instructions (Signed)
Dx: Atrial Fibrillation ( I48.2) Current Coumadin dose: 5mg  daily. No changes. Recheck in 4 weeks.

## 2018-09-23 ENCOUNTER — Telehealth: Payer: Self-pay

## 2018-09-23 NOTE — Telephone Encounter (Signed)
Patient states spoke with [facility] order pharmacy today and they have not received the refill approval for gabapentin (NEURONTIN) 300 MG capsule.

## 2018-09-23 NOTE — Telephone Encounter (Signed)
Advised that the pharmacy has the Rx and asked for clarification of the directions this morning and this was done.

## 2018-10-19 ENCOUNTER — Ambulatory Visit (INDEPENDENT_AMBULATORY_CARE_PROVIDER_SITE_OTHER): Payer: Medicare HMO

## 2018-10-19 DIAGNOSIS — I482 Chronic atrial fibrillation, unspecified: Secondary | ICD-10-CM | POA: Diagnosis not present

## 2018-10-19 LAB — POCT INR
INR: 3 (ref 2.0–3.0)
PT: 35.6

## 2018-10-19 NOTE — Patient Instructions (Signed)
Description   Dx: Atrial Fibrillation ( I48.2) Current Coumadin dose: 5mg  daily. No changes. Recheck in 4 weeks.

## 2018-11-16 ENCOUNTER — Ambulatory Visit (INDEPENDENT_AMBULATORY_CARE_PROVIDER_SITE_OTHER): Payer: Medicare HMO

## 2018-11-16 ENCOUNTER — Other Ambulatory Visit: Payer: Self-pay | Admitting: Urology

## 2018-11-16 ENCOUNTER — Other Ambulatory Visit: Payer: Self-pay

## 2018-11-16 DIAGNOSIS — I482 Chronic atrial fibrillation, unspecified: Secondary | ICD-10-CM

## 2018-11-16 DIAGNOSIS — I4891 Unspecified atrial fibrillation: Secondary | ICD-10-CM

## 2018-11-16 LAB — POCT INR
INR: 2.5 (ref 2.0–3.0)
PT: 30

## 2018-11-16 NOTE — Patient Instructions (Signed)
Description   Dx: Atrial Fibrillation ( I48.2) Current Coumadin dose: 5mg  daily. No changes. Recheck in 4 weeks.

## 2018-12-09 DIAGNOSIS — I10 Essential (primary) hypertension: Secondary | ICD-10-CM | POA: Diagnosis not present

## 2018-12-09 DIAGNOSIS — I420 Dilated cardiomyopathy: Secondary | ICD-10-CM | POA: Diagnosis not present

## 2018-12-09 DIAGNOSIS — I482 Chronic atrial fibrillation, unspecified: Secondary | ICD-10-CM | POA: Diagnosis not present

## 2018-12-20 DIAGNOSIS — I482 Chronic atrial fibrillation, unspecified: Secondary | ICD-10-CM | POA: Diagnosis not present

## 2018-12-21 ENCOUNTER — Other Ambulatory Visit: Payer: Self-pay

## 2018-12-21 ENCOUNTER — Ambulatory Visit (INDEPENDENT_AMBULATORY_CARE_PROVIDER_SITE_OTHER): Payer: Medicare HMO

## 2018-12-21 DIAGNOSIS — I4891 Unspecified atrial fibrillation: Secondary | ICD-10-CM

## 2018-12-21 DIAGNOSIS — I482 Chronic atrial fibrillation, unspecified: Secondary | ICD-10-CM | POA: Diagnosis not present

## 2018-12-21 LAB — POCT INR
INR: 2.4 (ref 2.0–3.0)
PT: 28.3

## 2018-12-21 NOTE — Patient Instructions (Signed)
Continue 5mg  daily; recheck in four weeks.

## 2018-12-23 ENCOUNTER — Other Ambulatory Visit: Payer: Self-pay | Admitting: Family Medicine

## 2018-12-23 DIAGNOSIS — I480 Paroxysmal atrial fibrillation: Secondary | ICD-10-CM

## 2018-12-23 DIAGNOSIS — I482 Chronic atrial fibrillation, unspecified: Secondary | ICD-10-CM | POA: Diagnosis not present

## 2018-12-23 DIAGNOSIS — I420 Dilated cardiomyopathy: Secondary | ICD-10-CM | POA: Diagnosis not present

## 2018-12-28 ENCOUNTER — Ambulatory Visit (INDEPENDENT_AMBULATORY_CARE_PROVIDER_SITE_OTHER): Payer: Medicare HMO | Admitting: Family Medicine

## 2018-12-28 ENCOUNTER — Encounter: Payer: Self-pay | Admitting: Family Medicine

## 2018-12-28 ENCOUNTER — Other Ambulatory Visit: Payer: Self-pay

## 2018-12-28 VITALS — BP 124/74 | HR 68 | Temp 97.6°F | Wt 202.4 lb

## 2018-12-28 DIAGNOSIS — R7309 Other abnormal glucose: Secondary | ICD-10-CM | POA: Diagnosis not present

## 2018-12-28 DIAGNOSIS — N400 Enlarged prostate without lower urinary tract symptoms: Secondary | ICD-10-CM

## 2018-12-28 DIAGNOSIS — I10 Essential (primary) hypertension: Secondary | ICD-10-CM

## 2018-12-28 DIAGNOSIS — E782 Mixed hyperlipidemia: Secondary | ICD-10-CM | POA: Diagnosis not present

## 2018-12-28 DIAGNOSIS — I429 Cardiomyopathy, unspecified: Secondary | ICD-10-CM | POA: Diagnosis not present

## 2018-12-28 DIAGNOSIS — R972 Elevated prostate specific antigen [PSA]: Secondary | ICD-10-CM | POA: Diagnosis not present

## 2018-12-28 DIAGNOSIS — I82409 Acute embolism and thrombosis of unspecified deep veins of unspecified lower extremity: Secondary | ICD-10-CM

## 2018-12-28 DIAGNOSIS — I4891 Unspecified atrial fibrillation: Secondary | ICD-10-CM | POA: Diagnosis not present

## 2018-12-28 NOTE — Progress Notes (Signed)
[name] Male    DOB: [birth year]   84 y.o.   MRN: [id number] Visit Date: 12/28/2018  Today's Provider: [name], MD   Chief Complaint  Patient presents with   Hypertension   Subjective:     HPI  Hypertension, follow-up:  BP Readings from Last 3 Encounters:  12/28/18 124/74  08/31/18 128/84  05/26/18 124/70    He was last seen for hypertension 9 months ago.  BP at that visit was 124/72. Management changes since that visit include no change. He reports good compliance with treatment. He is not having side effects.  He is exercising. He is adherent to low salt diet.   Outside blood pressures are being checked at home. He is experiencing none.  Patient denies chest pain, chest pressure/discomfort, claudication, dyspnea, exertional chest pressure/discomfort, fatigue, irregular heart beat, lower extremity edema, near-syncope, orthopnea, palpitations, paroxysmal nocturnal dyspnea, syncope and tachypnea.   Cardiovascular risk factors include advanced age (older than 29 for men, 15 for women), hypertension and male gender.  Use of agents associated with hypertension: none.     Weight trend: stable Wt Readings from Last 3 Encounters:  12/28/18 202 lb 6.4 oz (91.8 kg)  08/31/18 204 lb (92.5 kg)  05/26/18 196 lb (88.9 kg)   Overall patient has been feeling fairly well.  He remains active with his garden during the coronavirus pandemic. ------------------------------------------------------------------------  Allergies  Allergen Reactions   Cefdinir Nausea Only    hypersensitivity to smell   Doxycycline     Sun sensitivity   Prednisone    Sertraline Other (See Comments)    Hallucinations      Current Outpatient Medications:    amLODipine (NORVASC) 5 MG tablet, TAKE 1 TABLET EVERY DAY, Disp: 90 tablet, Rfl: 3   benazepril (LOTENSIN) 40 MG tablet, TAKE 1 TABLET EVERY DAY, Disp: 90 tablet, Rfl: 3   Carboxymethylcellulose Sodium  (LUBRICANT EYE DROPS OP), , Disp: , Rfl:    carvedilol (COREG) 6.25 MG tablet, TAKE 1 TABLET TWICE DAILY, Disp: 180 tablet, Rfl: 3   CORTIZONE-10 DIABETICS SKIN 1 % lotion, Apply 1 application topically daily. , Disp: , Rfl:    doxazosin (CARDURA) 8 MG tablet, TAKE 1 TABLET EVERY DAY, Disp: 90 tablet, Rfl: 3   finasteride (PROSCAR) 5 MG tablet, TAKE 1 TABLET EVERY DAY, Disp: 90 tablet, Rfl: 3   gabapentin (NEURONTIN) 300 MG capsule, TAKE 1 CAPSULE THREE TIMES DAILY (1 IN THE MORNING AND 2 IN THE EVENING), Disp: 270 capsule, Rfl: 3   Magnesium 500 MG TABS, Take by mouth daily. , Disp: , Rfl:    MULTIPLE VITAMIN PO, Take by mouth daily. , Disp: , Rfl:    oxyCODONE (OXY IR/ROXICODONE) 5 MG immediate release tablet, Take 1 tablet (5 mg total) by mouth every 4 (four) hours as needed for severe pain., Disp: 30 tablet, Rfl: 0   triamterene-hydrochlorothiazide (MAXZIDE-25) 37.5-25 MG tablet, TAKE 1/2 TABLET EVERY DAY, Disp: 45 tablet, Rfl: 3   warfarin (COUMADIN) 1 MG tablet, Take by mouth., Disp: , Rfl:    warfarin (COUMADIN) 4 MG tablet, TAKE 1 TABLET EVERY OTHER DAY AT 6PM ALTERNATING WITH 5MG , Disp: 45 tablet, Rfl: 2   warfarin (COUMADIN) 5 MG tablet, TAKE 1 TABLET EVERY OTHER DAY alternating with 4 mg, Disp: 90 tablet, Rfl: 0  Review of Systems  Constitutional: Negative.   Eyes: Negative.   Respiratory: Negative.   Cardiovascular: Negative.   Gastrointestinal: Negative.   Endocrine:  Negative.   Musculoskeletal: Negative.   Allergic/Immunologic: Negative.   Psychiatric/Behavioral: Negative.     Social History   Tobacco Use   Smoking status: Former Smoker    Last attempt to quit: 08/26/1971    Years since quitting: 47.3   Smokeless tobacco: Never Used   Tobacco comment: Smoked for about 20 years.  Substance Use Topics   Alcohol use: Not Currently      Objective:   BP 124/74 (BP Location: Right Arm, Patient Position: Sitting, Cuff Size: Normal)    Pulse 68    Temp 97.6  F (36.4 C) (Oral)    Wt 202 lb 6.4 oz (91.8 kg)    SpO2 97%    BMI 28.23 kg/m  Vitals:   12/28/18 0812  BP: 124/74  Pulse: 68  Temp: 97.6 F (36.4 C)  TempSrc: Oral  SpO2: 97%  Weight: 202 lb 6.4 oz (91.8 kg)     Physical Exam Vitals signs reviewed.  Constitutional:      Appearance: He is well-developed.  HENT:     Head: Normocephalic and atraumatic.     Right Ear: External ear normal.     Left Ear: External ear normal.     Nose: Nose normal.  Eyes:     General: No scleral icterus.    Conjunctiva/sclera: Conjunctivae normal.  Neck:     Thyroid: No thyromegaly.  Cardiovascular:     Rate and Rhythm: Normal rate and regular rhythm.     Heart sounds: Normal heart sounds.  Pulmonary:     Effort: Pulmonary effort is normal.     Breath sounds: Normal breath sounds.  Abdominal:     Palpations: Abdomen is soft.  Musculoskeletal: Normal range of motion.  Skin:    General: Skin is warm and dry.  Neurological:     Mental Status: He is alert and oriented to person, place, and time. Mental status is at baseline.  Psychiatric:        Mood and Affect: Mood normal.        Behavior: Behavior normal.        Thought Content: Thought content normal.        Judgment: Judgment normal.         Assessment & Plan    1. Atrial fibrillation, unspecified type ([place]) On warfarin.  2. Elevated glucose   3. Essential hypertension Controlled on amlodipine, benazepril, carvedolol, backside. RTC 1months. - CBC with Differential - Comprehensive Metabolic Panel (CMET) - TSH  4. Elevated PSA  - PSA  5. Combined fat and carbohydrate induced hyperlipemia  - Lipid Profile  6. BPH with elevated PSA  - PSA  7. Acute thromboembolism of deep veins of lower extremity, unspecified laterality ([place])   8. Cardiomyopathy, unspecified type [facility]) Per cardiology,Dr K.     Leasha Goldberger Cranford Mon, MD  [facility]

## 2018-12-29 DIAGNOSIS — E782 Mixed hyperlipidemia: Secondary | ICD-10-CM | POA: Diagnosis not present

## 2018-12-29 DIAGNOSIS — I10 Essential (primary) hypertension: Secondary | ICD-10-CM | POA: Diagnosis not present

## 2018-12-29 DIAGNOSIS — I482 Chronic atrial fibrillation, unspecified: Secondary | ICD-10-CM | POA: Diagnosis not present

## 2018-12-29 DIAGNOSIS — I420 Dilated cardiomyopathy: Secondary | ICD-10-CM | POA: Diagnosis not present

## 2018-12-29 LAB — COMPREHENSIVE METABOLIC PANEL
ALT: 19 IU/L (ref 0–44)
AST: 23 IU/L (ref 0–40)
Albumin/Globulin Ratio: 2.1 (ref 1.2–2.2)
Albumin: 4 g/dL (ref 3.6–4.6)
Alkaline Phosphatase: 68 IU/L (ref 39–117)
BUN/Creatinine Ratio: 19 (ref 10–24)
BUN: 22 mg/dL (ref 8–27)
Bilirubin Total: 0.8 mg/dL (ref 0.0–1.2)
CO2: 22 mmol/L (ref 20–29)
Calcium: 9.1 mg/dL (ref 8.6–10.2)
Chloride: 105 mmol/L (ref 96–106)
Creatinine, Ser: 1.16 mg/dL (ref 0.76–1.27)
GFR calc Af Amer: 67 mL/min/{1.73_m2} (ref >59)
GFR calc non Af Amer: 58 mL/min/{1.73_m2} — ABNORMAL LOW (ref >59)
Globulin, Total: 1.9 g/dL (ref 1.5–4.5)
Glucose: 97 mg/dL (ref 65–99)
Potassium: 4 mmol/L (ref 3.5–5.2)
Sodium: 142 mmol/L (ref 134–144)
Total Protein: 5.9 g/dL — ABNORMAL LOW (ref 6.0–8.5)

## 2018-12-29 LAB — CBC WITH DIFFERENTIAL/PLATELET
Basophils Absolute: 0 10*3/uL (ref 0.0–0.2)
Basos: 1 %
EOS (ABSOLUTE): 0.2 10*3/uL (ref 0.0–0.4)
Eos: 3 %
Hematocrit: 38.7 % (ref 37.5–51.0)
Hemoglobin: 13.5 g/dL (ref 13.0–17.7)
Immature Grans (Abs): 0 10*3/uL (ref 0.0–0.1)
Immature Granulocytes: 0 %
Lymphocytes Absolute: 1.4 10*3/uL (ref 0.7–3.1)
Lymphs: 27 %
MCH: 32.3 pg (ref 26.6–33.0)
MCHC: 34.9 g/dL (ref 31.5–35.7)
MCV: 93 fL (ref 79–97)
Monocytes Absolute: 0.6 10*3/uL (ref 0.1–0.9)
Monocytes: 12 %
Neutrophils Absolute: 2.9 10*3/uL (ref 1.4–7.0)
Neutrophils: 57 %
Platelets: 148 10*3/uL — ABNORMAL LOW (ref 150–450)
RBC: 4.18 x10E6/uL (ref 4.14–5.80)
RDW: 13.5 % (ref 11.6–15.4)
WBC: 5.1 10*3/uL (ref 3.4–10.8)

## 2018-12-29 LAB — LIPID PANEL
Chol/HDL Ratio: 2.4 ratio (ref 0.0–5.0)
Cholesterol, Total: 177 mg/dL (ref 100–199)
HDL: 75 mg/dL (ref >39)
LDL Calculated: 89 mg/dL (ref 0–99)
Triglycerides: 66 mg/dL (ref 0–149)
VLDL Cholesterol Cal: 13 mg/dL (ref 5–40)

## 2018-12-29 LAB — TSH: TSH: 1.93 u[IU]/mL (ref 0.450–4.500)

## 2018-12-29 LAB — PSA: Prostate Specific Ag, Serum: 3.7 ng/mL (ref 0.0–4.0)

## 2018-12-30 DIAGNOSIS — I429 Cardiomyopathy, unspecified: Secondary | ICD-10-CM | POA: Insufficient documentation

## 2019-01-19 ENCOUNTER — Ambulatory Visit (INDEPENDENT_AMBULATORY_CARE_PROVIDER_SITE_OTHER): Payer: Medicare HMO

## 2019-01-19 ENCOUNTER — Other Ambulatory Visit: Payer: Self-pay

## 2019-01-19 DIAGNOSIS — I4891 Unspecified atrial fibrillation: Secondary | ICD-10-CM

## 2019-01-19 DIAGNOSIS — I482 Chronic atrial fibrillation, unspecified: Secondary | ICD-10-CM

## 2019-01-19 LAB — POCT INR
INR: 2.9 (ref 2.0–3.0)
PT: 34.3

## 2019-01-19 NOTE — Patient Instructions (Signed)
Dx: Atrial Fibrillation ( I48.2) Current Coumadin dose: 5mg  daily. No changes. Recheck in 4 weeks.

## 2019-02-02 ENCOUNTER — Other Ambulatory Visit: Payer: Self-pay | Admitting: Family Medicine

## 2019-02-16 ENCOUNTER — Other Ambulatory Visit: Payer: Self-pay

## 2019-02-16 ENCOUNTER — Ambulatory Visit (INDEPENDENT_AMBULATORY_CARE_PROVIDER_SITE_OTHER): Payer: Medicare HMO

## 2019-02-16 DIAGNOSIS — I482 Chronic atrial fibrillation, unspecified: Secondary | ICD-10-CM

## 2019-02-16 DIAGNOSIS — I4891 Unspecified atrial fibrillation: Secondary | ICD-10-CM

## 2019-02-16 LAB — POCT INR
INR: 3.5 — AB (ref 2.0–3.0)
PT: 42

## 2019-03-02 ENCOUNTER — Other Ambulatory Visit: Payer: Self-pay

## 2019-03-02 ENCOUNTER — Ambulatory Visit (INDEPENDENT_AMBULATORY_CARE_PROVIDER_SITE_OTHER): Payer: Medicare HMO

## 2019-03-02 DIAGNOSIS — I4891 Unspecified atrial fibrillation: Secondary | ICD-10-CM

## 2019-03-02 DIAGNOSIS — I482 Chronic atrial fibrillation, unspecified: Secondary | ICD-10-CM

## 2019-03-02 LAB — POCT INR
INR: 2.4 (ref 2.0–3.0)
PT: 28.3

## 2019-03-02 NOTE — Patient Instructions (Signed)
Description   Dx: A-Fib  Current Coumadin dose: 5mg  QD PT: 28.3 INR: 2.4 Today's Changes: NO CHANGE Recheck: 4 weeks

## 2019-03-16 ENCOUNTER — Other Ambulatory Visit: Payer: Self-pay

## 2019-03-16 ENCOUNTER — Ambulatory Visit (INDEPENDENT_AMBULATORY_CARE_PROVIDER_SITE_OTHER): Payer: Medicare HMO

## 2019-03-16 ENCOUNTER — Ambulatory Visit: Payer: Self-pay

## 2019-03-16 DIAGNOSIS — Z0000 Encounter for general adult medical examination without abnormal findings: Secondary | ICD-10-CM | POA: Diagnosis not present

## 2019-03-16 NOTE — Progress Notes (Signed)
Subjective:   Casey Gallegos is a 84 y.o. male who presents for Medicare Annual/Subsequent preventive examination.    This visit is being conducted through telemedicine due to the COVID-19 pandemic. This patient has given me verbal consent via doximity to conduct this visit, patient states they are participating from their home address. Some vital signs may be absent or patient reported.    Patient identification: identified by name, DOB, and current address  Review of Systems:  N/A  Cardiac Risk Factors include: advanced age (>70men, >36 women);hypertension;male gender     Objective:    Vitals: There were no vitals taken for this visit.  There is no height or weight on file to calculate BMI. Unable to obtain vitals due to visit being conducted via telephonically.   Advanced Directives 03/16/2019 03/10/2018 01/13/2017 04/13/2016 01/11/2016 10/18/2015  Does Patient Have a Medical Advance Directive? Yes Yes Yes No Yes Yes  Type of Paramedic of [place];Living will [place];Living will Living will;Healthcare Power of [place];Living will  Copy of [place] in Chart? No - copy requested No - copy requested No - copy requested - - -  Would patient like information on creating a medical advance directive? - - - Yes - Educational materials given - -    Tobacco Social History   Tobacco Use  Smoking Status Former Smoker  . Quit date: 08/26/1971  . Years since quitting: 47.5  Smokeless Tobacco Never Used  Tobacco Comment   Smoked for about 20 years.     Counseling given: Not Answered Comment: Smoked for about 20 years.   Clinical Intake:  Pre-visit preparation completed: Yes  Pain Score: 0-No pain     Nutritional Risks: None Diabetes: No  How often do you need to have someone help you when you read instructions, pamphlets, or other written materials from your doctor or pharmacy?: 1 -  Never  Interpreter Needed?: No  Information entered by :: [facility], LPN  Past Medical History:  Diagnosis Date  . A-fib ([place])   . Anemia   . DVT (deep venous thrombosis) ([facility])    Monitor with PT/INR. Target 2.0 - 3.0   . Elevated PSA   . H/O degenerative disc disease   . Hypertension   . Lipomatosis   . Night sweats   . Obesity    Past Surgical History:  Procedure Laterality Date  . Bilateral Radical Keratotomy Bilateral 1997  . COLONOSCOPY WITH PROPOFOL N/A 01/11/2016   Procedure: COLONOSCOPY WITH PROPOFOL;  Surgeon: Manya Silvas, MD;  Location: [facility] ENDOSCOPY;  Service: Endoscopy;  Laterality: N/A; repeat 3 years, tubular adenoma  . deep vein thrombosis    . DENTAL SURGERY     Patient had implants  . GUM SURGERY    . L4 - S1 decompression Left    Left-sided  . PROSTATE BIOPSY    . TONSILLECTOMY AND ADENOIDECTOMY  age 46   Family History  Problem Relation Age of Onset  . Cancer Mother        secondary to cancinoma of the jaw from her dipping snuff.  . Heart attack Father   . CAD Father   . Hypertension Sister   . Diabetes Son        Type I diabetes  . Prostate cancer Neg Hx   . Kidney cancer Neg Hx    Social History   Socioeconomic History  . Marital status: Married  Spouse name: Not on file  . Number of children: 3  . Years of education: Not on file  . Highest education level: Associate degree: occupational, Hotel manager, or vocational program  Occupational History  . Occupation: retired  Scientific laboratory technician  . Financial resource strain: Not hard at all  . Food insecurity    Worry: Never true    Inability: Never true  . Transportation needs    Medical: No    Non-medical: No  Tobacco Use  . Smoking status: Former Smoker    Quit date: 08/26/1971    Years since quitting: 47.5  . Smokeless tobacco: Never Used  . Tobacco comment: Smoked for about 20 years.  Substance and Sexual Activity  . Alcohol use: Not Currently  . Drug use: No  . Sexual activity: Not  on file  Lifestyle  . Physical activity    Days per week: 0 days    Minutes per session: 0 min  . Stress: Not at all  Relationships  . Social Herbalist on phone: Patient refused    Gets together: Patient refused    Attends religious service: Patient refused    Active member of club or organization: Patient refused    Attends meetings of clubs or organizations: Patient refused    Relationship status: Patient refused  Other Topics Concern  . Not on file  Social History Narrative  . Not on file    Outpatient Encounter Medications as of 03/16/2019  Medication Sig  . amLODipine (NORVASC) 5 MG tablet TAKE 1 TABLET EVERY DAY  . benazepril (LOTENSIN) 40 MG tablet TAKE 1 TABLET EVERY DAY  . Carboxymethylcellulose Sodium (LUBRICANT EYE DROPS OP) as needed.   . carvedilol (COREG) 6.25 MG tablet TAKE 1 TABLET TWICE DAILY  . CORTIZONE-10 DIABETICS SKIN 1 % lotion Apply 1 application topically as needed.   . doxazosin (CARDURA) 8 MG tablet TAKE 1 TABLET EVERY DAY  . finasteride (PROSCAR) 5 MG tablet TAKE 1 TABLET EVERY DAY  . gabapentin (NEURONTIN) 300 MG capsule TAKE 1 CAPSULE THREE TIMES DAILY (1 IN THE MORNING AND 2 IN THE EVENING)  . Magnesium 500 MG TABS Take by mouth daily.   . Multiple Vitamin (MULTI-VITAMIN) tablet Take 1 tablet by mouth daily.   . MULTIPLE VITAMIN PO Take by mouth daily.   Marland Kitchen oxyCODONE (OXY IR/ROXICODONE) 5 MG immediate release tablet Take 1 tablet (5 mg total) by mouth every 4 (four) hours as needed for severe pain.  Marland Kitchen triamterene-hydrochlorothiazide (MAXZIDE-25) 37.5-25 MG tablet TAKE 1/2 TABLET EVERY DAY  . warfarin (COUMADIN) 5 MG tablet TAKE 1 TABLET EVERY OTHER DAY alternating with 4 mg (Patient taking differently: Take 5 mg by mouth one time only at 6 PM. )  . warfarin (COUMADIN) 1 MG tablet Take by mouth.  . warfarin (COUMADIN) 4 MG tablet TAKE 1 TABLET EVERY OTHER DAY AT 6PM ALTERNATING WITH 5MG  (Patient not taking: Reported on 03/16/2019)   No  facility-administered encounter medications on file as of 03/16/2019.     Activities of Daily Living In your present state of health, do you have any difficulty performing the following activities: 03/16/2019  Hearing? N  Vision? N  Difficulty concentrating or making decisions? N  Walking or climbing stairs? N  Dressing or bathing? N  Doing errands, shopping? N  Preparing Food and eating ? N  Using the Toilet? N  In the past six months, have you accidently leaked urine? N  Do you have problems with loss  of bowel control? N  Managing your Medications? N  Managing your Finances? N  Housekeeping or managing your Housekeeping? N  Some recent data might be hidden    Patient Care Team: Jerrol Banana., MD as PCP - General (Family Medicine) Hollice Espy, MD as Consulting Physician (Urology) Corey Skains, MD as Consulting Physician (Cardiology) Leandrew Koyanagi, MD as Referring Physician (Ophthalmology)   Assessment:   This is a routine wellness examination for Lucille.  Exercise Activities and Dietary recommendations Current Exercise Habits: The patient does not participate in regular exercise at present, Exercise limited by: None identified  Goals    . DIET - INCREASE WATER INTAKE     Recommend increasing water intake to 6-8 glasses a day.        Fall Risk: Fall Risk  03/16/2019 12/28/2018 03/10/2018 01/13/2017 04/15/2016  Falls in the past year? 0 0 No No No    FALL RISK PREVENTION PERTAINING TO THE HOME:  Any stairs in or around the home? No  If so, are there any without handrails? N/A  Home free of loose throw rugs in walkways, pet beds, electrical cords, etc? Yes  Adequate lighting in your home to reduce risk of falls? Yes   ASSISTIVE DEVICES UTILIZED TO PREVENT FALLS:  Life alert? No  Use of a cane, walker or w/c? No  Grab bars in the bathroom? No  Shower chair or bench in shower? No  Elevated toilet seat or a handicapped toilet? No   TIMED UP AND  GO:  Was the test performed? No .    Depression Screen   PHQ 2/9 Scores 03/16/2019 03/10/2018 01/13/2017 04/15/2016  PHQ - 2 Score 0 2 2 1   PHQ- 9 Score - 5 5 4     Cognitive Function: Declined today.  MMSE - Mini Mental State Exam 04/15/2016  Orientation to time 4  Orientation to Place 5  Registration 3  Attention/ Calculation 5  Recall 1  Language- name 2 objects 2  Language- repeat 1  Language- follow 3 step command 3  Language- read & follow direction 1  Write a sentence 1  Copy design 1  Total score 27     6CIT Screen 01/13/2017  What Year? 0 points  What month? 0 points  What time? 0 points  Count back from 20 0 points  Months in reverse 0 points  Repeat phrase 2 points  Total Score 2    Immunization History  Administered Date(s) Administered  . Influenza, High Dose Seasonal PF 05/11/2015, 05/21/2016, 05/27/2017, 05/26/2018  . Pneumococcal Conjugate-13 01/09/2014  . Pneumococcal Polysaccharide-23 08/08/2004  . Td 10/25/2008    Qualifies for Shingles Vaccine? Yes . Due for Shingrix. Education has been provided regarding the importance of this vaccine. Pt has been advised to call insurance company to determine out of pocket expense. Advised may also receive vaccine at local pharmacy or Health Dept. Verbalized acceptance and understanding.  Tdap: Although this vaccine is not a covered service during a Wellness Exam, does the patient still wish to receive this vaccine today?  No .   Flu Vaccine: Up to date  Pneumococcal Vaccine: Up to date  Screening Tests Health Maintenance  Topic Date Due  . TETANUS/TDAP  10/26/2018  . INFLUENZA VACCINE  03/26/2019  . PNA vac Low Risk Adult  Completed   Cancer Screenings:  Colorectal Screening: No longer required.   Lung Cancer Screening: (Low Dose CT Chest recommended if Age 44-80 years, 30 pack-year currently smoking OR  have quit w/in 15years.) does not qualify.   Additional Screening:  Vision Screening:  Recommended annual ophthalmology exams for early detection of glaucoma and other disorders of the eye.  Dental Screening: Recommended annual dental exams for proper oral hygiene  Community Resource Referral:  CRR required this visit?  No        Plan:  I have personally reviewed and addressed the Medicare Annual Wellness questionnaire and have noted the following in the patient's chart:  A. Medical and social history B. Use of alcohol, tobacco or illicit drugs  C. Current medications and supplements D. Functional ability and status E.  Nutritional status F.  Physical activity G. Advance directives H. List of other physicians I.  Hospitalizations, surgeries, and ER visits in previous 12 months J.  [place] such as hearing and vision if needed, cognitive and depression L. Referrals and appointments   In addition, I have reviewed and discussed with patient certain preventive protocols, quality metrics, and best practice recommendations. A written personalized care plan for preventive services as well as general preventive health recommendations were provided to patient.   [place] Score, [place]  [id number] Nurse Health Advisor   Nurse Notes: None.

## 2019-03-16 NOTE — Patient Instructions (Signed)
[name] , Thank you for taking time to come for your Medicare Wellness Visit. I appreciate your ongoing commitment to your health goals. Please review the following plan we discussed and let me know if I can assist you in the future.   Screening recommendations/referrals: Colonoscopy: No longer required.  Recommended yearly ophthalmology/optometry visit for glaucoma screening and checkup Recommended yearly dental visit for hygiene and checkup  Vaccinations: Influenza vaccine: Up to date Pneumococcal vaccine: Completed series Tdap vaccine: Pt declines today.  Shingles vaccine: Pt declines today.     Advanced directives: Please bring a copy of your POA (Power of Attorney) and/or Living Will to your next appointment.   Conditions/risks identified: Continue to increase water intake to 6-8 8 oz glasses a day.  Next appointment: 03/30/19 for PT/INR check.  Preventive Care 32 Years and Older, Male Preventive care refers to lifestyle choices and visits with your health care provider that can promote health and wellness. What does preventive care include?  A yearly physical exam. This is also called an annual well check.  Dental exams once or twice a year.  Routine eye exams. Ask your health care provider how often you should have your eyes checked.  Personal lifestyle choices, including:  Daily care of your teeth and gums.  Regular physical activity.  Eating a healthy diet.  Avoiding tobacco and drug use.  Limiting alcohol use.  Practicing safe sex.  Taking low doses of aspirin every day.  Taking vitamin and mineral supplements as recommended by your health care provider. What happens during an annual well check? The services and screenings done by your health care provider during your annual well check will depend on your age, overall health, lifestyle risk factors, and family history of disease. Counseling  Your health care provider may ask you questions about your:   Alcohol use.  Tobacco use.  Drug use.  Emotional well-being.  Home and relationship well-being.  Sexual activity.  Eating habits.  History of falls.  Memory and ability to understand (cognition).  Work and work Statistician. Screening  You may have the following tests or measurements:  Height, weight, and BMI.  Blood pressure.  Lipid and cholesterol levels. These may be checked every 5 years, or more frequently if you are over 3 years old.  Skin check.  Lung cancer screening. You may have this screening every year starting at age 87 if you have a 30-pack-year history of smoking and currently smoke or have quit within the past 15 years.  Fecal occult blood test (FOBT) of the stool. You may have this test every year starting at age 21.  Flexible sigmoidoscopy or colonoscopy. You may have a sigmoidoscopy every 5 years or a colonoscopy every 10 years starting at age 4.  Prostate cancer screening. Recommendations will vary depending on your family history and other risks.  Hepatitis C blood test.  Hepatitis B blood test.  Sexually transmitted disease (STD) testing.  Diabetes screening. This is done by checking your blood sugar (glucose) after you have not eaten for a while (fasting). You may have this done every 1-3 years.  Abdominal aortic aneurysm (AAA) screening. You may need this if you are a current or former smoker.  Osteoporosis. You may be screened starting at age 64 if you are at high risk. Talk with your health care provider about your test results, treatment options, and if necessary, the need for more tests. Vaccines  Your health care provider may recommend certain vaccines, such as:  Influenza vaccine. This is recommended every year.  Tetanus, diphtheria, and acellular pertussis (Tdap, Td) vaccine. You may need a Td booster every 10 years.  Zoster vaccine. You may need this after age 79.  Pneumococcal 13-valent conjugate (PCV13) vaccine. One dose is  recommended after age 51.  Pneumococcal polysaccharide (PPSV23) vaccine. One dose is recommended after age 76. Talk to your health care provider about which screenings and vaccines you need and how often you need them. This information is not intended to replace advice given to you by your health care provider. Make sure you discuss any questions you have with your health care provider. Document Released: 09/07/2015 Document Revised: 04/30/2016 Document Reviewed: 06/12/2015 Elsevier Interactive Patient Education  2017 [place] [employer] Prevention in the Home Falls can cause injuries. They can happen to people of all ages. There are many things you can do to make your home safe and to help prevent falls. What can I do on the outside of my home?  Regularly fix the edges of walkways and driveways and fix any cracks.  Remove anything that might make you trip as you walk through a door, such as a raised step or threshold.  Trim any bushes or trees on the path to your home.  Use bright outdoor lighting.  Clear any walking paths of anything that might make someone trip, such as rocks or tools.  Regularly check to see if handrails are loose or broken. Make sure that both sides of any steps have handrails.  Any raised decks and porches should have guardrails on the edges.  Have any leaves, snow, or ice cleared regularly.  Use sand or salt on walking paths during winter.  Clean up any spills in your garage right away. This includes oil or grease spills. What can I do in the bathroom?  Use night lights.  Install grab bars by the toilet and in the tub and shower. Do not use towel bars as grab bars.  Use non-skid mats or decals in the tub or shower.  If you need to sit down in the shower, use a plastic, non-slip stool.  Keep the floor dry. Clean up any water that spills on the floor as soon as it happens.  Remove soap buildup in the tub or shower regularly.  Attach bath mats  securely with double-sided non-slip rug tape.  Do not have throw rugs and other things on the floor that can make you trip. What can I do in the bedroom?  Use night lights.  Make sure that you have a light by your bed that is easy to reach.  Do not use any sheets or blankets that are too big for your bed. They should not hang down onto the floor.  Have a firm chair that has side arms. You can use this for support while you get dressed.  Do not have throw rugs and other things on the floor that can make you trip. What can I do in the kitchen?  Clean up any spills right away.  Avoid walking on wet floors.  Keep items that you use a lot in easy-to-reach places.  If you need to reach something above you, use a strong step stool that has a grab bar.  Keep electrical cords out of the way.  Do not use floor polish or wax that makes floors slippery. If you must use wax, use non-skid floor wax.  Do not have throw rugs and other things on the floor that  can make you trip. What can I do with my stairs?  Do not leave any items on the stairs.  Make sure that there are handrails on both sides of the stairs and use them. Fix handrails that are broken or loose. Make sure that handrails are as long as the stairways.  Check any carpeting to make sure that it is firmly attached to the stairs. Fix any carpet that is loose or worn.  Avoid having throw rugs at the top or bottom of the stairs. If you do have throw rugs, attach them to the floor with carpet tape.  Make sure that you have a light switch at the top of the stairs and the bottom of the stairs. If you do not have them, ask someone to add them for you. What else can I do to help prevent falls?  Wear shoes that:  Do not have high heels.  Have rubber bottoms.  Are comfortable and fit you well.  Are closed at the toe. Do not wear sandals.  If you use a stepladder:  Make sure that it is fully opened. Do not climb a closed  stepladder.  Make sure that both sides of the stepladder are locked into place.  Ask someone to hold it for you, if possible.  Clearly mark and make sure that you can see:  Any grab bars or handrails.  First and last steps.  Where the edge of each step is.  Use tools that help you move around (mobility aids) if they are needed. These include:  Canes.  Walkers.  Scooters.  Crutches.  Turn on the lights when you go into a dark area. Replace any light bulbs as soon as they burn out.  Set up your furniture so you have a clear path. Avoid moving your furniture around.  If any of your floors are uneven, fix them.  If there are any pets around you, be aware of where they are.  Review your medicines with your doctor. Some medicines can make you feel dizzy. This can increase your chance of falling. Ask your doctor what other things that you can do to help prevent falls. This information is not intended to replace advice given to you by your health care provider. Make sure you discuss any questions you have with your health care provider. Document Released: 06/07/2009 Document Revised: 01/17/2016 Document Reviewed: 09/15/2014 Elsevier Interactive Patient Education  2017 [employer].

## 2019-03-30 ENCOUNTER — Ambulatory Visit (INDEPENDENT_AMBULATORY_CARE_PROVIDER_SITE_OTHER): Payer: Medicare HMO

## 2019-03-30 ENCOUNTER — Other Ambulatory Visit: Payer: Self-pay

## 2019-03-30 DIAGNOSIS — I482 Chronic atrial fibrillation, unspecified: Secondary | ICD-10-CM | POA: Diagnosis not present

## 2019-03-30 DIAGNOSIS — I4891 Unspecified atrial fibrillation: Secondary | ICD-10-CM

## 2019-03-30 LAB — POCT INR
INR: 3.5 — AB (ref 2.0–3.0)
PT: 41.6

## 2019-03-30 NOTE — Patient Instructions (Signed)
Description   Hold Coumadin for 2 days, then resume at 5 mg daily.  Follow up in 2 weeks

## 2019-04-13 ENCOUNTER — Ambulatory Visit (INDEPENDENT_AMBULATORY_CARE_PROVIDER_SITE_OTHER): Payer: Medicare HMO

## 2019-04-13 ENCOUNTER — Other Ambulatory Visit: Payer: Self-pay

## 2019-04-13 DIAGNOSIS — I482 Chronic atrial fibrillation, unspecified: Secondary | ICD-10-CM

## 2019-04-13 DIAGNOSIS — I4891 Unspecified atrial fibrillation: Secondary | ICD-10-CM | POA: Diagnosis not present

## 2019-04-13 LAB — POCT INR
INR: 3.7 — AB (ref 2.0–3.0)
PT: 44.3

## 2019-04-13 NOTE — Patient Instructions (Signed)
Description    $ mg daily

## 2019-04-18 ENCOUNTER — Other Ambulatory Visit: Payer: Self-pay | Admitting: Family Medicine

## 2019-04-18 DIAGNOSIS — M5136 Other intervertebral disc degeneration, lumbar region: Secondary | ICD-10-CM

## 2019-04-18 NOTE — Telephone Encounter (Signed)
Pt needing a refill on:  oxyCODONE (OXY IR/ROXICODONE) 5 MG immediate release tablet  Please fill at:  [place], [place] [phone] (Phone) [phone] (Fax)     Thanks, [facility]

## 2019-04-18 NOTE — Telephone Encounter (Signed)
Please review. Thanks!  

## 2019-04-19 MED ORDER — OXYCODONE HCL 5 MG PO TABS: 5.0000 mg | ORAL_TABLET | ORAL | 0 refills | Status: DC | PRN

## 2019-04-22 ENCOUNTER — Other Ambulatory Visit: Payer: Self-pay | Admitting: Family Medicine

## 2019-04-22 DIAGNOSIS — I10 Essential (primary) hypertension: Secondary | ICD-10-CM

## 2019-04-27 ENCOUNTER — Ambulatory Visit (INDEPENDENT_AMBULATORY_CARE_PROVIDER_SITE_OTHER): Payer: Medicare HMO

## 2019-04-27 ENCOUNTER — Other Ambulatory Visit: Payer: Self-pay

## 2019-04-27 DIAGNOSIS — I4891 Unspecified atrial fibrillation: Secondary | ICD-10-CM

## 2019-04-27 DIAGNOSIS — I482 Chronic atrial fibrillation, unspecified: Secondary | ICD-10-CM

## 2019-04-27 LAB — POCT INR
INR: 2.1 (ref 2.0–3.0)
PT: 24.9

## 2019-04-27 NOTE — Patient Instructions (Signed)
Description   4 mg daily     

## 2019-05-10 ENCOUNTER — Other Ambulatory Visit: Payer: Self-pay

## 2019-05-10 ENCOUNTER — Ambulatory Visit (INDEPENDENT_AMBULATORY_CARE_PROVIDER_SITE_OTHER): Payer: Medicare HMO | Admitting: *Deleted

## 2019-05-10 DIAGNOSIS — Z23 Encounter for immunization: Secondary | ICD-10-CM

## 2019-05-10 DIAGNOSIS — I4891 Unspecified atrial fibrillation: Secondary | ICD-10-CM | POA: Diagnosis not present

## 2019-05-10 DIAGNOSIS — I482 Chronic atrial fibrillation, unspecified: Secondary | ICD-10-CM

## 2019-05-10 LAB — POCT INR
INR: 1.8 — AB (ref 2.0–3.0)
PT: 21.8

## 2019-05-10 NOTE — Patient Instructions (Signed)
Changes made today: Take 5 mg M,W,F and 4 mg all other days. Recheck in 2 weeks.

## 2019-05-24 ENCOUNTER — Other Ambulatory Visit: Payer: Self-pay

## 2019-05-24 ENCOUNTER — Ambulatory Visit (INDEPENDENT_AMBULATORY_CARE_PROVIDER_SITE_OTHER): Payer: Medicare HMO

## 2019-05-24 DIAGNOSIS — I482 Chronic atrial fibrillation, unspecified: Secondary | ICD-10-CM | POA: Diagnosis not present

## 2019-05-24 LAB — POCT INR
INR: 2.1 (ref 2.0–3.0)
PT: 25.8

## 2019-05-24 NOTE — Patient Instructions (Signed)
Description   No changes made today: Take 5 mg M,W,F and 4 mg all other days. Recheck in 4 weeks.

## 2019-06-22 ENCOUNTER — Ambulatory Visit (INDEPENDENT_AMBULATORY_CARE_PROVIDER_SITE_OTHER): Payer: Medicare HMO

## 2019-06-22 ENCOUNTER — Other Ambulatory Visit: Payer: Self-pay

## 2019-06-22 DIAGNOSIS — I482 Chronic atrial fibrillation, unspecified: Secondary | ICD-10-CM | POA: Diagnosis not present

## 2019-06-22 LAB — POCT INR
INR: 2 (ref 2.0–3.0)
PT: 24.4

## 2019-06-22 NOTE — Patient Instructions (Signed)
Description   No changes made today: Take 5 mg M,W,F and 4 mg all other days. Recheck in 4 weeks.

## 2019-06-28 DIAGNOSIS — Z961 Presence of intraocular lens: Secondary | ICD-10-CM | POA: Diagnosis not present

## 2019-07-04 NOTE — Progress Notes (Signed)
[name], Male    DOB: 09-18-1934, 84 y.o.   MRN: [id number] Visit Date: 07/05/2019  Today's Provider: [name], MD   Chief Complaint  Patient presents with  . Annual Exam   Subjective:     Annual wellness visit Casey Gallegos is a 84 y.o. male. He feels well. He reports exercising Everyday at the gym walking at treadmill and stationary bike. He reports he is sleeping well. ----------------------------------------------------------- Last Labs 12/28/2018  Review of Systems  Constitutional: Negative.   HENT: Negative.   Eyes: Negative.   Respiratory: Negative.   Cardiovascular: Negative.   Gastrointestinal: Negative.   Endocrine: Negative.   Musculoskeletal: Positive for back pain.  Allergic/Immunologic: Negative.   Neurological: Positive for numbness.       Feet go Numb from time to time.   Psychiatric/Behavioral: Negative.     Social History   Socioeconomic History  . Marital status: Married    Spouse name: Not on file  . Number of children: 3  . Years of education: Not on file  . Highest education level: Associate degree: occupational, Hotel manager, or vocational program  Occupational History  . Occupation: retired  Scientific laboratory technician  . Financial resource strain: Not hard at all  . Food insecurity    Worry: Never true    Inability: Never true  . Transportation needs    Medical: No    Non-medical: No  Tobacco Use  . Smoking status: Former Smoker    Quit date: 08/26/1971    Years since quitting: 47.8  . Smokeless tobacco: Never Used  . Tobacco comment: Smoked for about 20 years.  Substance and Sexual Activity  . Alcohol use: Not Currently  . Drug use: No  . Sexual activity: Not on file  Lifestyle  . Physical activity    Days per week: 0 days    Minutes per session: 0 min  . Stress: Not at all  Relationships  . Social Herbalist on phone: Patient refused    Gets together: Patient refused    Attends religious service:  Patient refused    Active member of club or organization: Patient refused    Attends meetings of clubs or organizations: Patient refused    Relationship status: Patient refused  . Intimate partner violence    Fear of current or ex partner: Patient refused    Emotionally abused: Patient refused    Physically abused: Patient refused    Forced sexual activity: Patient refused  Other Topics Concern  . Not on file  Social History Narrative  . Not on file    Past Medical History:  Diagnosis Date  . A-fib ([place])   . Anemia   . DVT (deep venous thrombosis) (HCC)    Monitor with PT/INR. Target 2.0 - 3.0   . Elevated PSA   . H/O degenerative disc disease   . Hypertension   . Lipomatosis   . Night sweats   . Obesity      Patient Active Problem List   Diagnosis Date Noted  . Cardiomyopathy ([place]) 12/30/2018  . Chronic venous insufficiency 11/11/2017  . Lymphedema 11/11/2017  . Pain in limb 10/27/2017  . A-fib ([place]) 03/02/2015  . Deep vein thrombosis ([place]) 03/02/2015  . Essential (primary) hypertension 03/02/2015  . Combined fat and carbohydrate induced hyperlipemia 03/02/2015  . Heart valve disease 03/02/2015  . Allergic rhinitis 02/28/2015  . Absolute anemia 02/28/2015  . Benign fibroma of  prostate 02/28/2015  . Back pain, chronic 02/28/2015  . Sex counseling 02/28/2015  . Narrowing of intervertebral disc space 02/28/2015  . Acute thromboembolism of deep veins of lower extremity ([place]) 02/28/2015  . ED (erectile dysfunction) of organic origin 02/28/2015  . Abnormal prostate specific antigen 02/28/2015  . BP (high blood pressure) 02/28/2015  . Lipomatosis 02/28/2015  . Malaise and fatigue 02/28/2015  . Generalized hyperhidrosis 02/28/2015  . Arthritis, degenerative 02/28/2015  . Adiposity 02/28/2015  . AF (paroxysmal atrial fibrillation) ([place]) 02/28/2015  . Chronic prostatitis 02/28/2015  . Spinal stenosis 02/28/2015  . Atypical pneumonia 02/28/2015    Past Surgical  History:  Procedure Laterality Date  . Bilateral Radical Keratotomy Bilateral 1997  . COLONOSCOPY WITH PROPOFOL N/A 01/11/2016   Procedure: COLONOSCOPY WITH PROPOFOL;  Surgeon: Manya Silvas, MD;  Location: [facility] ENDOSCOPY;  Service: Endoscopy;  Laterality: N/A; repeat 3 years, tubular adenoma  . deep vein thrombosis    . DENTAL SURGERY     Patient had implants  . GUM SURGERY    . L4 - S1 decompression Left    Left-sided  . PROSTATE BIOPSY    . TONSILLECTOMY AND ADENOIDECTOMY  age 23    His family history includes CAD in his father; Cancer in his mother; Diabetes in his son; Heart attack in his father; Hypertension in his sister. There is no history of Prostate cancer or Kidney cancer.   Current Outpatient Medications:  .  amLODipine (NORVASC) 5 MG tablet, TAKE 1 TABLET EVERY DAY, Disp: 90 tablet, Rfl: 3 .  benazepril (LOTENSIN) 40 MG tablet, TAKE 1 TABLET EVERY DAY, Disp: 90 tablet, Rfl: 3 .  Carboxymethylcellulose Sodium (LUBRICANT EYE DROPS OP), as needed. , Disp: , Rfl:  .  carvedilol (COREG) 6.25 MG tablet, TAKE 1 TABLET TWICE DAILY, Disp: 180 tablet, Rfl: 3 .  CORTIZONE-10 DIABETICS SKIN 1 % lotion, Apply 1 application topically as needed. , Disp: , Rfl:  .  doxazosin (CARDURA) 8 MG tablet, TAKE 1 TABLET EVERY DAY, Disp: 90 tablet, Rfl: 3 .  finasteride (PROSCAR) 5 MG tablet, TAKE 1 TABLET EVERY DAY, Disp: 90 tablet, Rfl: 3 .  gabapentin (NEURONTIN) 300 MG capsule, TAKE 1 CAPSULE THREE TIMES DAILY (1 IN THE MORNING AND 2 IN THE EVENING), Disp: 270 capsule, Rfl: 3 .  Magnesium 500 MG TABS, Take by mouth daily. , Disp: , Rfl:  .  Multiple Vitamin (MULTI-VITAMIN) tablet, Take 1 tablet by mouth daily. , Disp: , Rfl:  .  MULTIPLE VITAMIN PO, Take by mouth daily. , Disp: , Rfl:  .  oxyCODONE (OXY IR/ROXICODONE) 5 MG immediate release tablet, Take 1 tablet (5 mg total) by mouth every 4 (four) hours as needed for severe pain., Disp: 30 tablet, Rfl: 0 .  triamterene-hydrochlorothiazide  (MAXZIDE-25) 37.5-25 MG tablet, TAKE 1/2 TABLET EVERY DAY, Disp: 45 tablet, Rfl: 3 .  warfarin (COUMADIN) 1 MG tablet, Take by mouth., Disp: , Rfl:  .  warfarin (COUMADIN) 4 MG tablet, TAKE 1 TABLET EVERY OTHER DAY AT 6PM ALTERNATING WITH 5MG  (Patient not taking: Reported on 03/16/2019), Disp: 45 tablet, Rfl: 2 .  warfarin (COUMADIN) 5 MG tablet, TAKE 1 TABLET EVERY OTHER DAY alternating with 4 mg (Patient taking differently: Take 5 mg by mouth one time only at 6 PM. ), Disp: 90 tablet, Rfl: 0  Patient Care Team: Jerrol Banana., MD as PCP - General (Family Medicine) Hollice Espy, MD as Consulting Physician (Urology) Corey Skains, MD as Consulting Physician (Cardiology)  Leandrew Koyanagi, MD as Referring Physician (Ophthalmology)    Objective:    Vitals: BP 124/76   Pulse (!) 55   Temp (!) 97.1 F (36.2 C) (Temporal)   Resp 16   Ht 5\' 9"  (1.753 m)   Wt 199 lb (90.3 kg)   BMI 29.39 kg/m   Physical Exam Vitals signs reviewed.  Constitutional:      Appearance: He is well-developed.  HENT:     Head: Normocephalic and atraumatic.     Right Ear: External ear normal.     Left Ear: External ear normal.     Nose: Nose normal.  Eyes:     General: No scleral icterus.    Conjunctiva/sclera: Conjunctivae normal.  Neck:     Thyroid: No thyromegaly.  Cardiovascular:     Rate and Rhythm: Normal rate and regular rhythm.     Heart sounds: Normal heart sounds.  Pulmonary:     Effort: Pulmonary effort is normal.     Breath sounds: Normal breath sounds.  Abdominal:     Palpations: Abdomen is soft.  Musculoskeletal: Normal range of motion.  Skin:    General: Skin is warm and dry.  Neurological:     Mental Status: He is alert and oriented to person, place, and time. Mental status is at baseline.  Psychiatric:        Mood and Affect: Mood normal.        Behavior: Behavior normal.        Thought Content: Thought content normal.        Judgment: Judgment normal.      Activities of Daily Living In your present state of health, do you have any difficulty performing the following activities: 03/16/2019  Hearing? N  Vision? N  Difficulty concentrating or making decisions? N  Walking or climbing stairs? N  Dressing or bathing? N  Doing errands, shopping? N  Preparing Food and eating ? N  Using the Toilet? N  In the past six months, have you accidently leaked urine? N  Do you have problems with loss of bowel control? N  Managing your Medications? N  Managing your Finances? N  Housekeeping or managing your Housekeeping? N  Some recent data might be hidden    Fall Risk Assessment Fall Risk  03/16/2019 12/28/2018 03/10/2018 01/13/2017 04/15/2016  Falls in the past year? 0 0 No No No     Depression Screen PHQ 2/9 Scores 03/16/2019 03/10/2018 01/13/2017 04/15/2016  PHQ - 2 Score 0 2 2 1   PHQ- 9 Score - 5 5 4     6CIT Screen 01/13/2017  What Year? 0 points  What month? 0 points  What time? 0 points  Count back from 20 0 points  Months in reverse 0 points  Repeat phrase 2 points  Total Score 2      Assessment & Plan:     Annual Wellness Visit  Reviewed patient's Family Medical History Reviewed and updated list of patient's medical providers Assessment of cognitive impairment was done Assessed patient's functional ability Established a written schedule for health screening [place] Completed and Reviewed  Exercise Activities and Dietary recommendations Goals    . DIET - INCREASE WATER INTAKE     Recommend increasing water intake to 6-8 glasses a day.        Immunization History  Administered Date(s) Administered  . Fluad Quad(high Dose 65+) 05/10/2019  . Influenza, High Dose Seasonal PF 05/11/2015, 05/21/2016, 05/27/2017, 05/26/2018  . Pneumococcal Conjugate-13 01/09/2014  .  Pneumococcal Polysaccharide-23 08/08/2004  . Td 10/25/2008    Health Maintenance  Topic Date Due  . TETANUS/TDAP  10/26/2018  . INFLUENZA  VACCINE  Completed  . PNA vac Low Risk Adult  Completed     Discussed health benefits of physical activity, and encouraged him to engage in regular exercise appropriate for his age and condition.   1. Annual physical exam   2. Encounter for Medicare annual wellness exam   3. Essential (primary) hypertension   4. AF (paroxysmal atrial fibrillation) (HCC) Chronic coumadin.  5. Narrowing of intervertebral disc space   6. Chronic bilateral low back pain with bilateral sciatica Stable.  ------------------------------------------------------------------------------------------------------------    [name], MD  [facility]

## 2019-07-05 ENCOUNTER — Encounter: Payer: Self-pay | Admitting: Family Medicine

## 2019-07-05 ENCOUNTER — Ambulatory Visit (INDEPENDENT_AMBULATORY_CARE_PROVIDER_SITE_OTHER): Payer: Medicare HMO | Admitting: Family Medicine

## 2019-07-05 ENCOUNTER — Other Ambulatory Visit: Payer: Self-pay

## 2019-07-05 VITALS — BP 124/76 | HR 55 | Temp 97.1°F | Resp 16 | Ht 69.0 in | Wt 199.0 lb

## 2019-07-05 DIAGNOSIS — M5441 Lumbago with sciatica, right side: Secondary | ICD-10-CM

## 2019-07-05 DIAGNOSIS — I480 Paroxysmal atrial fibrillation: Secondary | ICD-10-CM

## 2019-07-05 DIAGNOSIS — Z0000 Encounter for general adult medical examination without abnormal findings: Secondary | ICD-10-CM | POA: Diagnosis not present

## 2019-07-05 DIAGNOSIS — M9979 Connective tissue and disc stenosis of intervertebral foramina of abdomen and other regions: Secondary | ICD-10-CM

## 2019-07-05 DIAGNOSIS — M5442 Lumbago with sciatica, left side: Secondary | ICD-10-CM

## 2019-07-05 DIAGNOSIS — I10 Essential (primary) hypertension: Secondary | ICD-10-CM | POA: Diagnosis not present

## 2019-07-05 DIAGNOSIS — G8929 Other chronic pain: Secondary | ICD-10-CM

## 2019-07-05 NOTE — Patient Instructions (Signed)
No Changes were made at this Visit.

## 2019-07-11 DIAGNOSIS — I420 Dilated cardiomyopathy: Secondary | ICD-10-CM | POA: Diagnosis not present

## 2019-07-11 DIAGNOSIS — E782 Mixed hyperlipidemia: Secondary | ICD-10-CM | POA: Diagnosis not present

## 2019-07-11 DIAGNOSIS — I82402 Acute embolism and thrombosis of unspecified deep veins of left lower extremity: Secondary | ICD-10-CM | POA: Diagnosis not present

## 2019-07-11 DIAGNOSIS — I482 Chronic atrial fibrillation, unspecified: Secondary | ICD-10-CM | POA: Diagnosis not present

## 2019-07-11 DIAGNOSIS — I10 Essential (primary) hypertension: Secondary | ICD-10-CM | POA: Diagnosis not present

## 2019-07-19 ENCOUNTER — Other Ambulatory Visit: Payer: Self-pay | Admitting: *Deleted

## 2019-07-19 ENCOUNTER — Other Ambulatory Visit: Payer: Self-pay

## 2019-07-19 ENCOUNTER — Ambulatory Visit (INDEPENDENT_AMBULATORY_CARE_PROVIDER_SITE_OTHER): Payer: Medicare HMO | Admitting: *Deleted

## 2019-07-19 DIAGNOSIS — I482 Chronic atrial fibrillation, unspecified: Secondary | ICD-10-CM

## 2019-07-19 DIAGNOSIS — M5136 Other intervertebral disc degeneration, lumbar region: Secondary | ICD-10-CM

## 2019-07-19 LAB — POCT INR
INR: 2.3 (ref 2.0–3.0)
PT: 27.2

## 2019-07-19 MED ORDER — OXYCODONE HCL 5 MG PO TABS: 5.0000 mg | ORAL_TABLET | ORAL | 0 refills | Status: DC | PRN

## 2019-07-19 NOTE — Patient Instructions (Signed)
No changes made today: Take 5 mg M,W,F and 4 mg all other days. Recheck in 4 weeks.

## 2019-07-19 NOTE — Telephone Encounter (Signed)
Please advise refill? 

## 2019-07-20 ENCOUNTER — Ambulatory Visit: Payer: Self-pay

## 2019-07-21 ENCOUNTER — Other Ambulatory Visit: Payer: Self-pay | Admitting: Family Medicine

## 2019-07-21 DIAGNOSIS — I480 Paroxysmal atrial fibrillation: Secondary | ICD-10-CM

## 2019-08-16 ENCOUNTER — Other Ambulatory Visit: Payer: Self-pay

## 2019-08-16 ENCOUNTER — Ambulatory Visit (INDEPENDENT_AMBULATORY_CARE_PROVIDER_SITE_OTHER): Payer: Medicare HMO

## 2019-08-16 DIAGNOSIS — I4891 Unspecified atrial fibrillation: Secondary | ICD-10-CM | POA: Diagnosis not present

## 2019-08-16 DIAGNOSIS — I482 Chronic atrial fibrillation, unspecified: Secondary | ICD-10-CM

## 2019-08-16 LAB — POCT INR
INR: 2 (ref 2.0–3.0)
PT: 28

## 2019-08-16 NOTE — Patient Instructions (Signed)
Description   No changes made today: Take 5 mg M,W,F and 4 mg all other days. Recheck in 4 weeks.

## 2019-08-25 ENCOUNTER — Other Ambulatory Visit: Payer: Self-pay | Admitting: Urology

## 2019-08-31 ENCOUNTER — Other Ambulatory Visit: Payer: Self-pay | Admitting: Family Medicine

## 2019-09-06 ENCOUNTER — Ambulatory Visit: Payer: Medicare HMO | Admitting: Urology

## 2019-09-06 ENCOUNTER — Other Ambulatory Visit: Payer: Self-pay

## 2019-09-06 ENCOUNTER — Encounter: Payer: Self-pay | Admitting: Urology

## 2019-09-06 VITALS — BP 137/85 | HR 85 | Ht 69.0 in | Wt 200.0 lb

## 2019-09-06 DIAGNOSIS — R3129 Other microscopic hematuria: Secondary | ICD-10-CM | POA: Diagnosis not present

## 2019-09-06 DIAGNOSIS — Z87448 Personal history of other diseases of urinary system: Secondary | ICD-10-CM | POA: Diagnosis not present

## 2019-09-06 DIAGNOSIS — N401 Enlarged prostate with lower urinary tract symptoms: Secondary | ICD-10-CM

## 2019-09-06 DIAGNOSIS — R339 Retention of urine, unspecified: Secondary | ICD-10-CM | POA: Diagnosis not present

## 2019-09-06 DIAGNOSIS — N138 Other obstructive and reflux uropathy: Secondary | ICD-10-CM | POA: Diagnosis not present

## 2019-09-06 LAB — URINALYSIS, COMPLETE
Bilirubin, UA: NEGATIVE
Glucose, UA: NEGATIVE
Ketones, UA: NEGATIVE
Leukocytes,UA: NEGATIVE
Nitrite, UA: NEGATIVE
Protein,UA: NEGATIVE
Specific Gravity, UA: 1.02 (ref 1.005–1.030)
Urobilinogen, Ur: 0.2 mg/dL (ref 0.2–1.0)
pH, UA: 7 (ref 5.0–7.5)

## 2019-09-06 LAB — MICROSCOPIC EXAMINATION
Bacteria, UA: NONE SEEN
WBC, UA: NONE SEEN /hpf (ref 0–5)

## 2019-09-06 LAB — BLADDER SCAN AMB NON-IMAGING

## 2019-09-06 NOTE — Progress Notes (Signed)
09/06/2019 10:37 AM   Casey Gallegos 02-26-1935 [id number]  Referring provider: Jerrol Banana., MD [address] [place],  [place] [address]  Chief Complaint  Patient presents with  . Hematuria    1 yr follow up    HPI: 85 year old male with BPH, incomplete bladder emptying, history of elevated PSA, and microscopic hematuria who returns for routine annual follow up.  HE was last seen 08/2018.    BPH with LUTS TRUS vol 72 cc in 2012.  IPSS 12/2.    PVR has been moderately elevated in the past around 100.  PVR today is 121.  He remains on cardura 8 mg and finasteride 5 mg (since 06/2016).    He reports that since starting finasteride, his symptoms have improved markedly.  He believes that this medication has been very helpful.  He is overall pleased with his urinary symptoms.  He does have some daytime frequency which he associates with hydrochlorothiazide.  The effect wears off several hours after taking this medication.  No urinary tract infections over the past year.  No episodes of gross hematuria.  No episodes of urinary retention.  ED  Not discussed today.  History of elevated PSA Personal history of elevated PSA status post negative prostate biopsy in 05/2011 by Dr. Bernardo Heater.    Review of previous records indicate that his PSA was as high as 13.  Most recent PSA performed by PCP on 12/28/2018 was 3.7.  Microscopic hematuria Incidental microscopic hematuria visit 09/1016 (3-10 RBC) with associated dysuria which has resolved.  Urine cytology 09/1016 negative.    He is on chronic Coumadin.  Smoked 1 ppd x 20 years, quit 40 year  Analysis today is negative.    IPSS    Row Name 09/06/19 1000         International Prostate Symptom Score   How often have you had the sensation of not emptying your bladder?  Less than 1 in 5     How often have you had to urinate less than every two hours?  Less than half the time     How often have you found  you stopped and started again several times when you urinated?  Less than half the time     How often have you found it difficult to postpone urination?  Less than half the time     How often have you had a weak urinary stream?  Less than half the time     How often have you had to strain to start urination?  Less than half the time     How many times did you typically get up at night to urinate?  1 Time     Total IPSS Score  12       Quality of Life due to urinary symptoms   If you were to spend the rest of your life with your urinary condition just the way it is now how would you feel about that?  Mostly Satisfied        Score:  1-7 Mild 8-19 Moderate 20-35 Severe   PMH: Past Medical History:  Diagnosis Date  . A-fib ([place])   . Anemia   . DVT (deep venous thrombosis) (HCC)    Monitor with PT/INR. Target 2.0 - 3.0   . Elevated PSA   . H/O degenerative disc disease   . Hypertension   . Lipomatosis   . Night sweats   . Obesity  Surgical History: Past Surgical History:  Procedure Laterality Date  . Bilateral Radical Keratotomy Bilateral 1997  . COLONOSCOPY WITH PROPOFOL N/A 01/11/2016   Procedure: COLONOSCOPY WITH PROPOFOL;  Surgeon: Manya Silvas, MD;  Location: [facility] ENDOSCOPY;  Service: Endoscopy;  Laterality: N/A; repeat 3 years, tubular adenoma  . deep vein thrombosis    . DENTAL SURGERY     Patient had implants  . GUM SURGERY    . L4 - S1 decompression Left    Left-sided  . PROSTATE BIOPSY    . TONSILLECTOMY AND ADENOIDECTOMY  age 48    Home Medications:  Allergies as of 09/06/2019      Reactions   Cefdinir Nausea Only   hypersensitivity to smell   Doxycycline    Sun sensitivity   Prednisone    Sertraline Other (See Comments)   Hallucinations       Medication List       Accurate as of September 06, 2019 10:37 AM. If you have any questions, ask your nurse or doctor.        amLODipine 5 MG tablet Commonly known as: NORVASC TAKE 1 TABLET EVERY  DAY   benazepril 40 MG tablet Commonly known as: LOTENSIN TAKE 1 TABLET EVERY DAY   carvedilol 6.25 MG tablet Commonly known as: COREG TAKE 1 TABLET TWICE DAILY   Cortizone-10 Diabetics Skin 1 % lotion Generic drug: hydrocortisone Apply 1 application topically as needed.   doxazosin 8 MG tablet Commonly known as: CARDURA TAKE 1 TABLET EVERY DAY   finasteride 5 MG tablet Commonly known as: PROSCAR TAKE 1 TABLET EVERY DAY   gabapentin 300 MG capsule Commonly known as: NEURONTIN TAKE 1 CAPSULE THREE TIMES DAILY (1 IN THE MORNING AND 2 IN THE EVENING)   LUBRICANT EYE DROPS OP as needed.   Magnesium 500 MG Tabs Take by mouth daily.   MULTIPLE VITAMIN PO Take by mouth daily.   Multi-Vitamin tablet Take 1 tablet by mouth daily.   oxyCODONE 5 MG immediate release tablet Commonly known as: Oxy IR/ROXICODONE Take 1 tablet (5 mg total) by mouth every 4 (four) hours as needed for severe pain.   triamterene-hydrochlorothiazide 37.5-25 MG tablet Commonly known as: MAXZIDE-25 TAKE 1/2 TABLET EVERY DAY   warfarin 1 MG tablet Commonly known as: COUMADIN Take as directed by the anticoagulation clinic. If you are unsure how to take this medication, talk to your nurse or doctor. Original instructions: Take by mouth.   warfarin 4 MG tablet Commonly known as: COUMADIN Take as directed by the anticoagulation clinic. If you are unsure how to take this medication, talk to your nurse or doctor. Original instructions: TAKE 1 TABLET EVERY OTHER DAY AT 6PM ALTERNATING WITH 5MG    warfarin 5 MG tablet Commonly known as: COUMADIN Take as directed by the anticoagulation clinic. If you are unsure how to take this medication, talk to your nurse or doctor. Original instructions: Take 1 tablet (5 mg total) by mouth daily at 6 PM.       Allergies:  Allergies  Allergen Reactions  . Cefdinir Nausea Only    hypersensitivity to smell  . Doxycycline     Sun sensitivity  . Prednisone   .  Sertraline Other (See Comments)    Hallucinations     Family History: Family History  Problem Relation Age of Onset  . Cancer Mother        secondary to cancinoma of the jaw from her dipping snuff.  . Heart attack Father   .  CAD Father   . Hypertension Sister   . Diabetes Son        Type I diabetes  . Prostate cancer Neg Hx   . Kidney cancer Neg Hx     Social History:  reports that he quit smoking about 48 years ago. He has never used smokeless tobacco. He reports previous alcohol use. He reports that he does not use drugs.  ROS: UROLOGY Frequent Urination?: No Hard to postpone urination?: No Burning/pain with urination?: No Get up at night to urinate?: No Leakage of urine?: No Urine stream starts and stops?: No Trouble starting stream?: No Do you have to strain to urinate?: No Blood in urine?: No Urinary tract infection?: No Sexually transmitted disease?: No Injury to kidneys or bladder?: No Painful intercourse?: No Weak stream?: No Erection problems?: No Penile pain?: No  Gastrointestinal Nausea?: No Vomiting?: No Indigestion/heartburn?: No Diarrhea?: No Constipation?: No  Constitutional Fever: No Night sweats?: No Weight loss?: No Fatigue?: No  Skin Skin rash/lesions?: No Itching?: No  Eyes Blurred vision?: No Double vision?: No  Ears/Nose/Throat Sore throat?: No Sinus problems?: No  Hematologic/Lymphatic Swollen glands?: No Easy bruising?: Yes  Cardiovascular Leg swelling?: Yes Chest pain?: No  Respiratory Cough?: No Shortness of breath?: No  Endocrine Excessive thirst?: No  Musculoskeletal Back pain?: No Joint pain?: No  Neurological Headaches?: No Dizziness?: No  Psychologic Depression?: No Anxiety?: No  Physical Exam: BP 137/85 (BP Location: Left Arm, Patient Position: Sitting, Cuff Size: Large)   Pulse 85   Ht 5\' 9"  (1.753 m)   Wt 200 lb (90.7 kg)   BMI 29.53 kg/m   Constitutional:  Alert and oriented, No acute  distress. HEENT: [place] AT, moist mucus membranes.  Trachea midline, no masses. Cardiovascular: No clubbing, cyanosis, or edema. Respiratory: Normal respiratory effort, no increased work of breathing. Skin: No rashes, bruises or suspicious lesions. Neurologic: Grossly intact, no focal deficits, moving all 4 extremities. Psychiatric: Normal mood and affect.  Laboratory Data: Lab Results  Component Value Date   WBC 5.1 12/28/2018   HGB 13.5 12/28/2018   HCT 38.7 12/28/2018   MCV 93 12/28/2018   PLT 148 (L) 12/28/2018    Lab Results  Component Value Date   CREATININE 1.16 12/28/2018   PSA as above  Urinalysis Negative, no evidence of microscopic hematuria, see epic.  Pertinent Imaging: Results for orders placed or performed in visit on 09/06/19  Bladder Scan (Post Void Residual) in office  Result Value Ref Range   Scan Result 157mL     Assessment & Plan:    1. BPH with obstruction/lower urinary tract symptoms Symptoms well controlled on finasteride/doxazosin We will continue these medications indefinitely Reviewed the option of outlet surgery, not interested in any surgical procedure PSA is low and at baseline, will likely defer further PSA screening given his age and comorbidities - Bladder Scan (Post Void Residual) in office  2. Incomplete bladder emptying PVRs have been borderline elevated, will continue to follow annually Overall, he has no sequela including no history of frank urinary retention, infections, or any other warning symptoms Urinary retention precautions reviewed  3. History of hematuria No evidence of microscopic hematuria, will continue to check annually - Urinalysis, Complete   Return in 1 year (on 09/05/2020) for [facility] for , IPSS/ PVR/ UA, 1 yr follow up.  Hollice Espy, MD  [facility] [address], [place], [place] [address] [phone]

## 2019-09-13 ENCOUNTER — Other Ambulatory Visit: Payer: Self-pay

## 2019-09-13 ENCOUNTER — Ambulatory Visit (INDEPENDENT_AMBULATORY_CARE_PROVIDER_SITE_OTHER): Payer: Medicare HMO

## 2019-09-13 DIAGNOSIS — I482 Chronic atrial fibrillation, unspecified: Secondary | ICD-10-CM | POA: Diagnosis not present

## 2019-09-13 DIAGNOSIS — I4891 Unspecified atrial fibrillation: Secondary | ICD-10-CM

## 2019-09-13 DIAGNOSIS — M5136 Other intervertebral disc degeneration, lumbar region: Secondary | ICD-10-CM

## 2019-09-13 LAB — POCT INR
INR: 2.4 (ref 2.0–3.0)
PT: 28.9

## 2019-09-13 MED ORDER — OXYCODONE HCL 5 MG PO TABS: 5.0000 mg | ORAL_TABLET | ORAL | 0 refills | Status: DC | PRN

## 2019-09-26 ENCOUNTER — Other Ambulatory Visit: Payer: Self-pay | Admitting: Family Medicine

## 2019-10-11 ENCOUNTER — Other Ambulatory Visit: Payer: Self-pay

## 2019-10-11 ENCOUNTER — Ambulatory Visit (INDEPENDENT_AMBULATORY_CARE_PROVIDER_SITE_OTHER): Payer: Medicare HMO

## 2019-10-11 DIAGNOSIS — I4891 Unspecified atrial fibrillation: Secondary | ICD-10-CM | POA: Diagnosis not present

## 2019-10-11 LAB — POCT INR
INR: 2.4 (ref 2.0–3.0)
PT: 29.3

## 2019-10-11 NOTE — Patient Instructions (Signed)
Description   No changes made today: Take 5 mg M,W,F and 4 mg all other days. Recheck in 4 weeks.

## 2019-11-08 ENCOUNTER — Other Ambulatory Visit: Payer: Self-pay

## 2019-11-08 ENCOUNTER — Ambulatory Visit (INDEPENDENT_AMBULATORY_CARE_PROVIDER_SITE_OTHER): Payer: Medicare HMO

## 2019-11-08 DIAGNOSIS — I4891 Unspecified atrial fibrillation: Secondary | ICD-10-CM | POA: Diagnosis not present

## 2019-11-08 LAB — POCT INR
INR: 2.3 (ref 2.0–3.0)
PT: 28.1

## 2019-11-08 NOTE — Patient Instructions (Signed)
Description   No changes made today: Take 5 mg M,W,F and 4 mg all other days. Recheck in 4 weeks.

## 2019-12-06 ENCOUNTER — Ambulatory Visit (INDEPENDENT_AMBULATORY_CARE_PROVIDER_SITE_OTHER): Payer: Medicare HMO

## 2019-12-06 ENCOUNTER — Other Ambulatory Visit: Payer: Self-pay

## 2019-12-06 DIAGNOSIS — I4891 Unspecified atrial fibrillation: Secondary | ICD-10-CM

## 2019-12-06 LAB — POCT INR
INR: 2.2 (ref 2.0–3.0)
PT: 25.9

## 2019-12-06 NOTE — Patient Instructions (Signed)
Description   No changes made today: Take 5 mg M,W,F and 4 mg all other days. Recheck in 4 weeks.

## 2019-12-10 ENCOUNTER — Other Ambulatory Visit: Payer: Self-pay | Admitting: Family Medicine

## 2019-12-10 DIAGNOSIS — I10 Essential (primary) hypertension: Secondary | ICD-10-CM

## 2019-12-10 NOTE — Telephone Encounter (Signed)
Requested medication (s) are due for refill today: no  Requested medication (s) are on the active medication list: yes  Last refill:  Warfarin:08/31/19 #90 1 RF   Maxzide-25:04/22/19 #45 3 RF   Future visit scheduled: 01/03/20 yes  Notes to clinic:  Coumadin not delegated to NT to refill Maxzide should have enough med to last until 01/20/19   Requested Prescriptions  Pending Prescriptions Disp Refills   warfarin (COUMADIN) 5 MG tablet [Pharmacy Med Name: WARFARIN SODIUM 5 MG Tablet] 90 tablet 1    Sig: TAKE 1 TABLET EVERY DAY  AT  [place]      Hematology:  Anticoagulants - warfarin Failed - 12/10/2019  9:21 AM      Failed - This refill cannot be delegated      Failed - If the patient is managed by Coumadin Clinic - route to their Pool. If not, forward to the provider.      Failed - Valid encounter within last 3 months    Recent Outpatient Visits           5 months ago Annual physical exam   [facility] Jerrol Banana., MD   11 months ago Essential hypertension   [facility] Jerrol Banana., MD   1 year ago Essential (primary) hypertension   [facility] Jerrol Banana., MD   1 year ago Chronic atrial fibrillation [facility])   [facility] Jerrol Banana., MD   2 years ago Chronic atrial fibrillation [facility])   [facility] Jerrol Banana., MD       Future Appointments             In 1 week Ralene Bathe, MD [place]   In 9 months McGowan, Gordan [facility]            Passed - INR in normal range and within 30 days    INR  Date Value Ref Range Status  12/06/2019 2.2 2.0 - 3.0 Final  07/08/2017 1.8 (H)  Final    Comment:    Reference Range                     0.9-1.1 Moderate-intensity Warfarin Therapy 2.0-3.0 Higher-intensity Warfarin Therapy   3.0-4.0  .             triamterene-hydrochlorothiazide (MAXZIDE-25) 37.5-25  MG tablet [Pharmacy Med Name: TRIAMTERENE/HYDROCHLOROTHIAZIDE 37.5-25 MG Tablet] 45 tablet 3    Sig: TAKE 1/2 TABLET EVERY DAY      Cardiovascular: Diuretic Combos Passed - 12/10/2019  9:21 AM      Passed - K in normal range and within 360 days    Potassium  Date Value Ref Range Status  12/28/2018 4.0 3.5 - 5.2 mmol/L Final          Passed - Na in normal range and within 360 days    Sodium  Date Value Ref Range Status  12/28/2018 142 134 - 144 mmol/L Final          Passed - Cr in normal range and within 360 days    Creatinine, Ser  Date Value Ref Range Status  12/28/2018 1.16 0.76 - 1.27 mg/dL Final          Passed - Ca in normal range and within 360 days    Calcium  Date Value Ref Range Status  12/28/2018 9.1 8.6 - 10.2 mg/dL Final  Passed - Last BP in normal range    BP Readings from Last 1 Encounters:  09/06/19 137/85          Passed - Valid encounter within last 6 months    Recent Outpatient Visits           5 months ago Annual physical exam   [facility] Jerrol Banana., MD   11 months ago Essential hypertension   [facility] Jerrol Banana., MD   1 year ago Essential (primary) hypertension   [facility] Jerrol Banana., MD   1 year ago Chronic atrial fibrillation [facility])   [facility] Jerrol Banana., MD   2 years ago Chronic atrial fibrillation [facility])   [facility] Jerrol Banana., MD       Future Appointments             In 1 week Ralene Bathe, MD [place]   In 9 months McGowan, Shannon A, [place]

## 2019-12-21 ENCOUNTER — Ambulatory Visit: Payer: Medicare HMO | Admitting: Dermatology

## 2019-12-29 NOTE — Progress Notes (Signed)
Established patient visit   [name]   DOB: February 23, 1935   85 y.o. Male  MRN: [id number] Visit Date: 01/03/2020  Today's healthcare provider: [name], MD   Chief Complaint  Patient presents with  . Hypertension   Subjective    HPI  Patient complains of worsening neuropathic symptoms of his legs and feet with tingling and discomfort and a cold feeling along  with numbness. Hypertension, follow-up  BP Readings from Last 3 Encounters:  01/03/20 121/73  09/06/19 137/85  07/05/19 124/76   Wt Readings from Last 3 Encounters:  01/03/20 202 lb 12.8 oz (92 kg)  09/06/19 200 lb (90.7 kg)  07/05/19 199 lb (90.3 kg)     He was last seen for hypertension 6 months ago.  BP at that visit was 124/76. Management since that visit includes; continue medications. He reports excellent compliance with treatment. He is not having side effects.  He is exercising. He is not adherent to low salt diet.   Outside blood pressures are is checking.  He does not smoke.  Use of agents associated with hypertension: none.   ---------------------------------------------------------------------------------------------------  AF (paroxysmal atrial fibrillation) (HCC) From 07/04/2020-Chronic coumadin. Last 12/06/2019 INR-2.2, PT-25.9  Chronic bilateral low back pain with bilateral sciatica From 07/04/2020-Stable.  Social History   Tobacco Use  . Smoking status: Former Smoker    Quit date: 08/26/1971    Years since quitting: 48.3  . Smokeless tobacco: Never Used  . Tobacco comment: Smoked for about 20 years.  Substance Use Topics  . Alcohol use: Not Currently  . Drug use: No       Medications: Outpatient Medications Prior to Visit  Medication Sig  . amLODipine (NORVASC) 5 MG tablet TAKE 1 TABLET EVERY DAY  . benazepril (LOTENSIN) 40 MG tablet TAKE 1 TABLET EVERY DAY  . carvedilol (COREG) 6.25 MG tablet TAKE 1 TABLET TWICE DAILY  . CORTIZONE-10 DIABETICS SKIN 1  % lotion Apply 1 application topically as needed.   . doxazosin (CARDURA) 8 MG tablet TAKE 1 TABLET EVERY DAY  . finasteride (PROSCAR) 5 MG tablet TAKE 1 TABLET EVERY DAY  . gabapentin (NEURONTIN) 300 MG capsule TAKE 1 CAPSULE EVERY MORNING AND TAKE 2 CAPSULES EVERY EVENING  . Magnesium 500 MG TABS Take by mouth daily.   . Multiple Vitamin (MULTI-VITAMIN) tablet Take 1 tablet by mouth daily.   . MULTIPLE VITAMIN PO Take by mouth daily.   Marland Kitchen oxyCODONE (OXY IR/ROXICODONE) 5 MG immediate release tablet Take 1 tablet (5 mg total) by mouth every 4 (four) hours as needed for severe pain.  Marland Kitchen triamterene-hydrochlorothiazide (MAXZIDE-25) 37.5-25 MG tablet TAKE 1/2 TABLET EVERY DAY  . warfarin (COUMADIN) 1 MG tablet Take by mouth.  . warfarin (COUMADIN) 4 MG tablet TAKE 1 TABLET EVERY OTHER DAY AT 6PM ALTERNATING WITH 5MG   . warfarin (COUMADIN) 5 MG tablet TAKE 1 TABLET EVERY DAY  AT  6PM  . Carboxymethylcellulose Sodium (LUBRICANT EYE DROPS OP) as needed.    No facility-administered medications prior to visit.    Review of Systems  Constitutional: Negative for appetite change, chills and fever.  HENT: Negative.   Eyes: Negative.   Respiratory: Negative for chest tightness, shortness of breath and wheezing.   Cardiovascular: Negative for chest pain and palpitations.  Gastrointestinal: Negative for abdominal pain, nausea and vomiting.  Endocrine: Negative.   Musculoskeletal: Positive for back pain.  Allergic/Immunologic: Negative.   Neurological: Positive for numbness.  Hematological: Negative.   Psychiatric/Behavioral:  Negative.        Objective    BP 121/73 (BP Location: Right Arm, Patient Position: Sitting, Cuff Size: Large)   Pulse 70   Temp (!) 96.9 F (36.1 C) (Temporal)   Ht 5\' 9"  (1.753 m)   Wt 202 lb 12.8 oz (92 kg)   SpO2 98%   BMI 29.95 kg/m  BP Readings from Last 3 Encounters:  01/03/20 121/73  09/06/19 137/85  07/05/19 124/76      Physical Exam Vitals reviewed.   Constitutional:      Appearance: He is well-developed.  HENT:     Head: Normocephalic and atraumatic.     Right Ear: External ear normal.     Left Ear: External ear normal.     Nose: Nose normal.  Eyes:     General: No scleral icterus.    Conjunctiva/sclera: Conjunctivae normal.  Neck:     Thyroid: No thyromegaly.  Cardiovascular:     Rate and Rhythm: Normal rate and regular rhythm.     Heart sounds: Normal heart sounds.  Pulmonary:     Effort: Pulmonary effort is normal.     Breath sounds: Normal breath sounds.  Abdominal:     Palpations: Abdomen is soft.  Skin:    General: Skin is warm and dry.     Capillary Refill: Capillary refill takes 2 to 3 seconds.  Neurological:     Mental Status: He is alert and oriented to person, place, and time. Mental status is at baseline.     Comments: Mild decrease sensation of feet legs distally  Psychiatric:        Mood and Affect: Mood normal.        Behavior: Behavior normal.        Thought Content: Thought content normal.        Judgment: Judgment normal.       No results found for any visits on 01/03/20.  Assessment & Plan     1. AF (paroxysmal atrial fibrillation) (HCC) Lifelong Coumadin - POCT INR; Standing - POCT INR - B12 - CBC with Differential/Platelet - Comprehensive metabolic panel - TSH - Lipid panel  2. Essential (primary) hypertension Good control with Dyazide amlodipine benazepril and carvedilol and doxazosin - B12 - CBC with Differential/Platelet - Comprehensive metabolic panel - TSH - Lipid panel  3. Atrial fibrillation, unspecified type ([place])  - B12 - CBC with Differential/Platelet - Comprehensive metabolic panel - TSH - Lipid panel  4. Combined hyperlipemia  - B12 - CBC with Differential/Platelet - Comprehensive metabolic panel - TSH - Lipid panel  5. Chronic kidney disease, unspecified CKD stage Avoid nonsteroidals - B12 - CBC with Differential/Platelet - Comprehensive metabolic  panel - TSH - Lipid panel  6. Tingling B12 level  - gabapentin (NEURONTIN) 300 MG capsule; TAKE 1 CAPSULE EVERY MORNING, TAKE 1 IN THE AFTERNOON AND TAKE 2 CAPSULES AT BED TIME  Dispense: 360 capsule; Refill: 3  7. DDD (degenerative disc disease), lumbar  - oxyCODONE (OXY IR/ROXICODONE) 5 MG immediate release tablet; Take 1 tablet (5 mg total) by mouth every 4 (four) hours as needed for severe pain.  Dispense: 30 tablet; Refill: 0  8. Polyneuropathy Increase gabapentin to 300 twice a day and 600 mg at bedtime   No follow-ups on file.      I, [name], MD, have reviewed all documentation for this visit. The documentation on 01/08/20 for the exam, diagnosis, procedures, and orders are all accurate and complete.  Dezarae Mcclaran Cranford Mon, MD  [facility] [phone] (phone) [phone] (fax)  [place]

## 2020-01-03 ENCOUNTER — Other Ambulatory Visit: Payer: Self-pay

## 2020-01-03 ENCOUNTER — Ambulatory Visit (INDEPENDENT_AMBULATORY_CARE_PROVIDER_SITE_OTHER): Payer: Medicare HMO | Admitting: Family Medicine

## 2020-01-03 ENCOUNTER — Encounter: Payer: Self-pay | Admitting: Family Medicine

## 2020-01-03 VITALS — BP 121/73 | HR 70 | Temp 96.9°F | Ht 69.0 in | Wt 202.8 lb

## 2020-01-03 DIAGNOSIS — I4891 Unspecified atrial fibrillation: Secondary | ICD-10-CM | POA: Diagnosis not present

## 2020-01-03 DIAGNOSIS — I480 Paroxysmal atrial fibrillation: Secondary | ICD-10-CM

## 2020-01-03 DIAGNOSIS — M5136 Other intervertebral disc degeneration, lumbar region: Secondary | ICD-10-CM

## 2020-01-03 DIAGNOSIS — N189 Chronic kidney disease, unspecified: Secondary | ICD-10-CM

## 2020-01-03 DIAGNOSIS — E782 Mixed hyperlipidemia: Secondary | ICD-10-CM | POA: Diagnosis not present

## 2020-01-03 DIAGNOSIS — R202 Paresthesia of skin: Secondary | ICD-10-CM

## 2020-01-03 DIAGNOSIS — I10 Essential (primary) hypertension: Secondary | ICD-10-CM | POA: Diagnosis not present

## 2020-01-03 DIAGNOSIS — G629 Polyneuropathy, unspecified: Secondary | ICD-10-CM | POA: Diagnosis not present

## 2020-01-03 LAB — POCT INR
INR: 1.9 — AB (ref 2.0–3.0)
PT: 30

## 2020-01-03 MED ORDER — GABAPENTIN 300 MG PO CAPS: ORAL_CAPSULE | ORAL | 3 refills | Status: DC

## 2020-01-03 NOTE — Patient Instructions (Addendum)
WARFARIN - TAKE 4 MG Monday, Wednesday & Friday AND 5 MG EVERY OTHER DAY!!

## 2020-01-04 DIAGNOSIS — N189 Chronic kidney disease, unspecified: Secondary | ICD-10-CM | POA: Diagnosis not present

## 2020-01-04 DIAGNOSIS — D649 Anemia, unspecified: Secondary | ICD-10-CM | POA: Diagnosis not present

## 2020-01-04 DIAGNOSIS — R5383 Other fatigue: Secondary | ICD-10-CM | POA: Diagnosis not present

## 2020-01-04 DIAGNOSIS — E782 Mixed hyperlipidemia: Secondary | ICD-10-CM | POA: Diagnosis not present

## 2020-01-04 DIAGNOSIS — R5381 Other malaise: Secondary | ICD-10-CM | POA: Diagnosis not present

## 2020-01-04 DIAGNOSIS — I480 Paroxysmal atrial fibrillation: Secondary | ICD-10-CM | POA: Diagnosis not present

## 2020-01-04 DIAGNOSIS — I10 Essential (primary) hypertension: Secondary | ICD-10-CM | POA: Diagnosis not present

## 2020-01-04 DIAGNOSIS — I4891 Unspecified atrial fibrillation: Secondary | ICD-10-CM | POA: Diagnosis not present

## 2020-01-05 LAB — CBC WITH DIFFERENTIAL/PLATELET
Basophils Absolute: 0 10*3/uL (ref 0.0–0.2)
Basos: 1 %
EOS (ABSOLUTE): 0.2 10*3/uL (ref 0.0–0.4)
Eos: 4 %
Hematocrit: 36.6 % — ABNORMAL LOW (ref 37.5–51.0)
Hemoglobin: 12.5 g/dL — ABNORMAL LOW (ref 13.0–17.7)
Immature Grans (Abs): 0 10*3/uL (ref 0.0–0.1)
Immature Granulocytes: 0 %
Lymphocytes Absolute: 1.3 10*3/uL (ref 0.7–3.1)
Lymphs: 26 %
MCH: 31.7 pg (ref 26.6–33.0)
MCHC: 34.2 g/dL (ref 31.5–35.7)
MCV: 93 fL (ref 79–97)
Monocytes Absolute: 0.5 10*3/uL (ref 0.1–0.9)
Monocytes: 11 %
Neutrophils Absolute: 2.8 10*3/uL (ref 1.4–7.0)
Neutrophils: 58 %
Platelets: 140 10*3/uL — ABNORMAL LOW (ref 150–450)
RBC: 3.94 x10E6/uL — ABNORMAL LOW (ref 4.14–5.80)
RDW: 13.5 % (ref 11.6–15.4)
WBC: 4.8 10*3/uL (ref 3.4–10.8)

## 2020-01-05 LAB — COMPREHENSIVE METABOLIC PANEL
ALT: 17 IU/L (ref 0–44)
AST: 21 IU/L (ref 0–40)
Albumin/Globulin Ratio: 2.2 (ref 1.2–2.2)
Albumin: 4 g/dL (ref 3.6–4.6)
Alkaline Phosphatase: 68 IU/L (ref 39–117)
BUN/Creatinine Ratio: 22 (ref 10–24)
BUN: 24 mg/dL (ref 8–27)
Bilirubin Total: 0.8 mg/dL (ref 0.0–1.2)
CO2: 22 mmol/L (ref 20–29)
Calcium: 8.7 mg/dL (ref 8.6–10.2)
Chloride: 109 mmol/L — ABNORMAL HIGH (ref 96–106)
Creatinine, Ser: 1.09 mg/dL (ref 0.76–1.27)
GFR calc Af Amer: 72 mL/min/{1.73_m2} (ref >59)
GFR calc non Af Amer: 62 mL/min/{1.73_m2} (ref >59)
Globulin, Total: 1.8 g/dL (ref 1.5–4.5)
Glucose: 105 mg/dL — ABNORMAL HIGH (ref 65–99)
Potassium: 4.1 mmol/L (ref 3.5–5.2)
Sodium: 143 mmol/L (ref 134–144)
Total Protein: 5.8 g/dL — ABNORMAL LOW (ref 6.0–8.5)

## 2020-01-05 LAB — LIPID PANEL
Chol/HDL Ratio: 2.4 ratio (ref 0.0–5.0)
Cholesterol, Total: 165 mg/dL (ref 100–199)
HDL: 70 mg/dL (ref >39)
LDL Chol Calc (NIH): 82 mg/dL (ref 0–99)
Triglycerides: 69 mg/dL (ref 0–149)
VLDL Cholesterol Cal: 13 mg/dL (ref 5–40)

## 2020-01-05 LAB — TSH: TSH: 1.39 u[IU]/mL (ref 0.450–4.500)

## 2020-01-05 LAB — VITAMIN B12: Vitamin B-12: 362 pg/mL (ref 232–1245)

## 2020-01-08 MED ORDER — OXYCODONE HCL 5 MG PO TABS: 5.0000 mg | ORAL_TABLET | ORAL | 0 refills | Status: DC | PRN

## 2020-01-09 ENCOUNTER — Telehealth: Payer: Self-pay

## 2020-01-09 NOTE — Telephone Encounter (Signed)
Patient given results through [place] and has read Dr. Alben Spittle comments.

## 2020-01-09 NOTE — Telephone Encounter (Signed)
-----   Message from Jerrol Banana., MD sent at 01/08/2020 10:31 AM EDT ----- Labs stable.

## 2020-01-11 ENCOUNTER — Other Ambulatory Visit: Payer: Self-pay | Admitting: Family Medicine

## 2020-01-16 DIAGNOSIS — I2699 Other pulmonary embolism without acute cor pulmonale: Secondary | ICD-10-CM | POA: Diagnosis not present

## 2020-01-16 DIAGNOSIS — I10 Essential (primary) hypertension: Secondary | ICD-10-CM | POA: Diagnosis not present

## 2020-01-16 DIAGNOSIS — I071 Rheumatic tricuspid insufficiency: Secondary | ICD-10-CM | POA: Diagnosis not present

## 2020-01-16 DIAGNOSIS — I420 Dilated cardiomyopathy: Secondary | ICD-10-CM | POA: Diagnosis not present

## 2020-01-16 DIAGNOSIS — E782 Mixed hyperlipidemia: Secondary | ICD-10-CM | POA: Diagnosis not present

## 2020-01-16 DIAGNOSIS — I482 Chronic atrial fibrillation, unspecified: Secondary | ICD-10-CM | POA: Diagnosis not present

## 2020-01-16 DIAGNOSIS — I82409 Acute embolism and thrombosis of unspecified deep veins of unspecified lower extremity: Secondary | ICD-10-CM | POA: Diagnosis not present

## 2020-01-24 ENCOUNTER — Ambulatory Visit (INDEPENDENT_AMBULATORY_CARE_PROVIDER_SITE_OTHER): Payer: Medicare HMO

## 2020-01-24 ENCOUNTER — Other Ambulatory Visit: Payer: Self-pay

## 2020-01-24 DIAGNOSIS — I480 Paroxysmal atrial fibrillation: Secondary | ICD-10-CM

## 2020-01-24 LAB — POCT INR
INR: 2.1 (ref 2.0–3.0)
PT: 24.7

## 2020-01-24 NOTE — Patient Instructions (Signed)
Description   No changes made today: Take 4 mg M,W,F and 5 mg all other days. Recheck in 4 weeks.

## 2020-02-21 ENCOUNTER — Other Ambulatory Visit: Payer: Self-pay

## 2020-02-21 ENCOUNTER — Ambulatory Visit (INDEPENDENT_AMBULATORY_CARE_PROVIDER_SITE_OTHER): Payer: Medicare HMO

## 2020-02-21 DIAGNOSIS — I4811 Longstanding persistent atrial fibrillation: Secondary | ICD-10-CM | POA: Diagnosis not present

## 2020-02-21 DIAGNOSIS — I480 Paroxysmal atrial fibrillation: Secondary | ICD-10-CM | POA: Diagnosis not present

## 2020-02-21 LAB — POCT INR
INR: 2.7 (ref 2.0–3.0)
PT: 32

## 2020-02-21 NOTE — Patient Instructions (Signed)
Continue 5mg  daily except 4mg  M, W, F.  Recheck in four weeks.

## 2020-03-02 ENCOUNTER — Other Ambulatory Visit: Payer: Self-pay | Admitting: Family Medicine

## 2020-03-15 ENCOUNTER — Telehealth: Payer: Self-pay | Admitting: Family Medicine

## 2020-03-15 NOTE — Telephone Encounter (Signed)
Copied from [place] [phone]. Topic: Medicare AWV >> Mar 15, 2020  1:53 PM Cher Nakai R wrote: Reason for CRM: Left message for patient to call back and schedule Medicare Annual Wellness Visit (AWV) either virtually or in office.  Last AWV  03/16/2019  Please schedule at anytime with [facility] Health Advisor.

## 2020-03-20 ENCOUNTER — Other Ambulatory Visit: Payer: Self-pay

## 2020-03-20 ENCOUNTER — Ambulatory Visit (INDEPENDENT_AMBULATORY_CARE_PROVIDER_SITE_OTHER): Payer: Medicare HMO

## 2020-03-20 DIAGNOSIS — I480 Paroxysmal atrial fibrillation: Secondary | ICD-10-CM

## 2020-03-20 LAB — POCT INR
INR: 2.6 (ref 2.0–3.0)
PT: 31.3

## 2020-03-20 NOTE — Patient Instructions (Signed)
Description   No changes made today: Take 4 mg M,W,F and 5 mg all other days. Recheck in 4 weeks.(patient has an appointment with Dr. Darnell Level 04/04/2020 requested for his INR to be check then instead of the 08/17 so he doesn't have to come in so often.

## 2020-03-22 ENCOUNTER — Other Ambulatory Visit: Payer: Self-pay

## 2020-03-22 ENCOUNTER — Ambulatory Visit: Payer: Medicare HMO | Admitting: Dermatology

## 2020-03-22 ENCOUNTER — Encounter: Payer: Self-pay | Admitting: Dermatology

## 2020-03-22 DIAGNOSIS — L814 Other melanin hyperpigmentation: Secondary | ICD-10-CM | POA: Diagnosis not present

## 2020-03-22 DIAGNOSIS — L720 Epidermal cyst: Secondary | ICD-10-CM | POA: Diagnosis not present

## 2020-03-22 DIAGNOSIS — D171 Benign lipomatous neoplasm of skin and subcutaneous tissue of trunk: Secondary | ICD-10-CM | POA: Diagnosis not present

## 2020-03-22 DIAGNOSIS — D1723 Benign lipomatous neoplasm of skin and subcutaneous tissue of right leg: Secondary | ICD-10-CM | POA: Diagnosis not present

## 2020-03-22 DIAGNOSIS — Q825 Congenital non-neoplastic nevus: Secondary | ICD-10-CM

## 2020-03-22 DIAGNOSIS — D229 Melanocytic nevi, unspecified: Secondary | ICD-10-CM

## 2020-03-22 DIAGNOSIS — L821 Other seborrheic keratosis: Secondary | ICD-10-CM

## 2020-03-22 DIAGNOSIS — L570 Actinic keratosis: Secondary | ICD-10-CM | POA: Diagnosis not present

## 2020-03-22 DIAGNOSIS — L578 Other skin changes due to chronic exposure to nonionizing radiation: Secondary | ICD-10-CM

## 2020-03-22 DIAGNOSIS — Z1283 Encounter for screening for malignant neoplasm of skin: Secondary | ICD-10-CM

## 2020-03-22 DIAGNOSIS — D1800 Hemangioma unspecified site: Secondary | ICD-10-CM

## 2020-03-22 NOTE — Patient Instructions (Addendum)
Recommend daily broad spectrum sunscreen SPF 30+ to sun-exposed areas, reapply every 2 hours as needed. Call for new or changing lesions. Prior to procedure, discussed risks of blister formation, small wound, skin dyspigmentation, or rare scar following cryotherapy.  Liquid nitrogen was applied for 10-12 seconds to the skin lesion and the expected blistering or scabbing reaction explained. Do not pick at the area. Patient reminded to expect hypopigmented scars from the procedure. Return if lesion fails to fully resolve.  Cryotherapy Aftercare  . Wash gently with soap and water everyday.   Marland Kitchen Apply Vaseline and Band-Aid daily until healed.   Pre-Operative Instructions  You are scheduled for a surgical procedure at [facility]. We recommend you read the following instructions. If you have any questions or concerns, please call the office at [phone].  1. Shower and wash the entire body with soap and water the day of your surgery paying special attention to cleansing at and around the planned surgery site.  2. Avoid aspirin or aspirin containing products at least fourteen (14) days prior to your surgical procedure and for at least one week (7 Days) after your surgical procedure. If you take aspirin on a regular basis for heart disease or history of stroke or for any other reason, we may recommend you continue taking aspirin but please notify us if you take this on a regular basis. Aspirin can cause more bleeding to occur during surgery as well as prolonged bleeding and bruising after surgery.   3. Avoid other nonsteroidal pain medications at least one week prior to surgery and at least one week prior to your surgery. These include medications such as Ibuprofen (Motrin, Advil and Nuprin), Naprosyn, Voltaren, Relafen, etc. If medications are used for therapeutic reasons, please inform us as they can cause increased bleeding or prolonged bleeding during and bruising after surgical procedures.    4. Please advice [place] if you are taking any "blood thinner" medications such as Coumadin or Dipyridamole or Plavix or similar medications. These cause increased bleeding and prolonged bleeding during and bruising after surgical procedures. We may have to consider discontinuing these medications briefly prior to and shortly after your surgery, if safe to do so.   5. Please inform us of all medications you are currently taking. All medications that are taken regularly should be taken the day of surgery as you always do. Nevertheless, we need to be informed of what medications you are taking prior to surgery to whether they will affect the procedure or cause any complications.   6. Please inform us of any medication allergies. Also inform us of whether you have allergies to Latex or rubber products or whether you have had any adverse reaction to Lidocaine or Epinephrine.  7. Please inform us of any prosthetic or artificial body parts such as artificial heart valve, joint replacements, etc., or similar condition that might require preoperative antibiotics.   8. We recommend avoidance of alcohol at least two weeks prior to surgery and continued avoidence for at least two weeks after surgery.   9. We recommend discontinuation of tobacco smoking at least two weeks prior to surgery and continued abstinence for at least two weeks after surgery.  10. Do not plan strenuous exercise, strenuous work or strenuous lifting for approximately four weeks after your surgery.   11. We request if you are unable to make your scheduled surgical appointment, please call us at least a week in advance or as soon as you are aware of a problem  sot aht we can cancel or reschedule you.   12. You MAKE TAKE TYLENOL (acetaminophen) for pain as it is not a blood thinner.   13. PLEASE PLAN TO BE IN TOWN FOR TWO WEEKS FOLLOWING SURGERY, THIS IS IMPORTANT SO YOU CAN BE CHECKED FOR DRESSING CHANGES, SUTURE REMOVAL AND TO MONITOR FOR  POSSIBLE COMPLICATIONS.

## 2020-03-22 NOTE — Progress Notes (Signed)
New Patient Visit  Subjective  Casey Gallegos is a 85 y.o. male who presents for the following: TBSE. The patient presents for Total-Body Skin Exam (TBSE) for skin cancer screening and mole check. Patient presents today as a new patient to establish care. He does have a few areas of concern on his back and chest, neck and right elbow. Patient states that he does not have a skin cancer hx.  Patient has familial hereditary lipomatosis, patient has multiple lipoma. Patient also has history of Afib that is currently being followed by Dr. Serafina Royals  The following portions of the chart were reviewed this encounter and updated as appropriate:  Tobacco  Allergies  Meds  Problems  Med Hx  Surg Hx  Fam Hx     Review of Systems:  No other skin or systemic complaints except as noted in HPI or Assessment and Plan.  Objective  Well appearing patient in no apparent distress; mood and affect are within normal limits.  A full examination was performed including scalp, head, eyes, ears, nose, lips, neck, chest, axillae, abdomen, back, buttocks, bilateral upper extremities, bilateral lower extremities, hands, feet, fingers, toes, fingernails, and toenails. All findings within normal limits unless otherwise noted below.  Objective  Right Antecubital Fossa: 4.0 cm x 2.0 cm rubbery subcutaneous nodule with symptoms and/or recent change.  Discussed surgical excision to remove, including resulting scar and possible recurrence.  Patient will schedule for surgery. Pre-op information given.   Objective  Left Abdomen (side) - Upper: 3.0 cm rubbery subcutaneous nodule with symptoms and/or recent change.  Discussed surgical excision to remove, including resulting scar and possible recurrence.  Patient will schedule for surgery. Pre-op information given.   Objective  Neck - Posterior, Right Upper Back: Cyst with symptoms and/or recent change.  Discussed surgical excision to remove, including resulting  scar and possible recurrence.  Patient will schedule for surgery. Pre-op information given.   Objective  Left posterior neck x 2 (2): Erythematous thin papules/macules with gritty scale.   Objective  Left Knee - Anterior: Red patch   Assessment & Plan    Lipoma of right upper extremity Right Antecubital Fossa Discussed removal of symptomatic lipoma as needed  Lipoma of torso Left Abdomen (side) - Upper Discussed excision if symptomatic  Epidermal inclusion cyst (2) Neck - Posterior; Right Upper Back Discussed surgical option.  Patient may consider.  Nondangerous discussed.  AK (actinic keratosis) Left posterior neck x 2  Cryotherapy today with Liquid nitrogen destruction after consent. Prior to procedure, discussed risks of blister formation, small wound, skin dyspigmentation, or rare scar following cryotherapy.   Vascular birthmark Left Knee - Anterior   Lentigines - Scattered tan macules - Discussed due to sun exposure - Benign, observe - Call for any changes  Seborrheic Keratoses - Stuck-on, waxy, tan-brown papules and plaques  - Discussed benign etiology and prognosis. - Observe - Call for any changes  Melanocytic Nevi - Tan-brown and/or pink-flesh-colored symmetric macules and papules - Benign appearing on exam today - Observation - Call clinic for new or changing moles - Recommend daily use of broad spectrum spf 30+ sunscreen to sun-exposed areas.   Hemangiomas - Red papules - Discussed benign nature - Observe - Call for any changes  Actinic Damage - diffuse scaly erythematous macules with underlying dyspigmentation - Recommend daily broad spectrum sunscreen SPF 30+ to sun-exposed areas, reapply every 2 hours as needed.  - Call for new or changing lesions.  Skin cancer screening performed today.  Return for  Schedule first avail surgery R anticubital Lipoma.  Marene Lenz, CMA, am acting as scribe for Sarina Ser, MD . Documentation:  I have reviewed the above documentation for accuracy and completeness, and I agree with the above.  Sarina Ser, MD

## 2020-03-26 ENCOUNTER — Encounter: Payer: Self-pay | Admitting: Dermatology

## 2020-04-02 NOTE — Progress Notes (Signed)
I,[name],acting as a scribe for [name], MD.,have documented all relevant documentation on the behalf of [name], MD,as directed by  [name], MD while in the presence of [name], MD.   Established patient visit   [name]   DOB: October 25, 1934   85 y.o. Male  MRN: [id number] Visit Date: 04/04/2020  Today's healthcare provider: [name], MD   Chief Complaint  Patient presents with  . Atrial Fibrillation  . Follow-up  . Hypertension   Subjective    HPI  Patient doing well and having no complaints.  He is off his Dyazide.  He is on over-the-counter B12.  He works out daily and is active in his garden.  Does not mind the need of the summer.  He tries to stay hydrated. Hypertension, follow-up  BP Readings from Last 3 Encounters:  04/04/20 122/63  01/03/20 121/73  09/06/19 137/85   Wt Readings from Last 3 Encounters:  04/04/20 199 lb (90.3 kg)  01/03/20 202 lb 12.8 oz (92 kg)  09/06/19 200 lb (90.7 kg)     He was last seen for hypertension 3 months ago.  BP at that visit was 121/73. Management since that visit includes; Good control with Dyazide amlodipine benazepril and carvedilol and doxazosin. He reports good compliance with treatment. He is not having side effects. none He is exercising. He is adherent to low salt diet.   Outside blood pressures are normal.  He does not smoke.  Use of agents associated with hypertension: none.   --------------------------------------------------------------------  AF (paroxysmal atrial fibrillation) (HCC) From 01/03/2020-Lifelong Coumadin.  Chronic kidney disease, unspecified CKD stage From 01/03/2020-Avoid nonsteroidals  DDD (degenerative disc disease), lumbar From 01/03/2020-refilled oxycodone 5 MG.  Polyneuropathy From 01/03/2020-Increased gabapentin to 300 twice a day and 600 mg at bedtime.      Medications: Outpatient Medications Prior to Visit   Medication Sig  . amLODipine (NORVASC) 5 MG tablet TAKE 1 TABLET EVERY DAY  . benazepril (LOTENSIN) 40 MG tablet TAKE 1 TABLET EVERY DAY  . carvedilol (COREG) 6.25 MG tablet TAKE 1 TABLET TWICE DAILY  . CORTIZONE-10 DIABETICS SKIN 1 % lotion Apply 1 application topically as needed.   . doxazosin (CARDURA) 8 MG tablet TAKE 1 TABLET EVERY DAY  . finasteride (PROSCAR) 5 MG tablet TAKE 1 TABLET EVERY DAY  . gabapentin (NEURONTIN) 300 MG capsule TAKE 1 CAPSULE EVERY MORNING, TAKE 1 IN THE AFTERNOON AND TAKE 2 CAPSULES AT BED TIME  . Magnesium 500 MG TABS Take by mouth daily.   . Multiple Vitamin (MULTI-VITAMIN) tablet Take 1 tablet by mouth daily.   . MULTIPLE VITAMIN PO Take by mouth daily.   Marland Kitchen oxyCODONE (OXY IR/ROXICODONE) 5 MG immediate release tablet Take 1 tablet (5 mg total) by mouth every 4 (four) hours as needed for severe pain.  Marland Kitchen triamterene-hydrochlorothiazide (MAXZIDE-25) 37.5-25 MG tablet TAKE 1/2 TABLET EVERY DAY  . warfarin (COUMADIN) 1 MG tablet Take by mouth.  . warfarin (COUMADIN) 4 MG tablet TAKE 1 TABLET EVERY OTHER DAY AT 6PM ALTERNATING WITH 5MG   . warfarin (COUMADIN) 5 MG tablet TAKE 1 TABLET EVERY DAY  AT  6PM  . Carboxymethylcellulose Sodium (LUBRICANT EYE DROPS OP) as needed.  (Patient not taking: Reported on 04/04/2020)   No facility-administered medications prior to visit.    Review of Systems  Constitutional: Negative for appetite change, chills and fever.  Respiratory: Negative for chest tightness, shortness of breath and wheezing.  Cardiovascular: Negative for chest pain and palpitations.  Gastrointestinal: Negative for abdominal pain, nausea and vomiting.       Objective    BP 122/63 (BP Location: Right Arm, Patient Position: Sitting, Cuff Size: Large)   Pulse (!) 53   Temp 98.1 F (36.7 C) (Other (Comment))   Resp 18   Ht 5\' 10"  (1.778 m)   Wt 199 lb (90.3 kg)   SpO2 96%   BMI 28.55 kg/m  BP Readings from Last 3 Encounters:  04/04/20 122/63   01/03/20 121/73  09/06/19 137/85   Wt Readings from Last 3 Encounters:  04/04/20 199 lb (90.3 kg)  01/03/20 202 lb 12.8 oz (92 kg)  09/06/19 200 lb (90.7 kg)      Physical Exam  BP 122/63 (BP Location: Right Arm, Patient Position: Sitting, Cuff Size: Large)   Pulse (!) 53   Temp 98.1 F (36.7 C) (Other (Comment))   Resp 18   Ht 5\' 10"  (1.778 m)   Wt 199 lb (90.3 kg)   SpO2 96%   BMI 28.55 kg/m   General Appearance:    Alert, cooperative, no distress, appears stated age  Head:    Normocephalic, without obvious abnormality, atraumatic  Eyes:    PERRL, conjunctiva/corneas clear, EOM's intact, fundi    benign, both eyes       Ears:    Normal TM's and external ear canals, both ears  Nose:   Nares normal, septum midline, mucosa normal, no drainage   or sinus tenderness  Throat:   Lips, mucosa, and tongue normal; teeth and gums normal  Neck:   Supple, symmetrical, trachea midline, no adenopathy;       thyroid:  No enlargement/tenderness/nodules; no carotid   bruit or JVD  Back:     Symmetric, no curvature, ROM normal, no CVA tenderness  Lungs:     Clear to auscultation bilaterally, respirations unlabored  Chest wall:    No tenderness or deformity  Heart:    Regular rate and rhythm, S1 and S2 normal, no murmur, rub   or gallop  Abdomen:     Soft, non-tender, bowel sounds active all four quadrants,    no masses, no organomegaly  Genitalia:    Normal male without lesion, discharge or tenderness  Rectal:    Normal tone, normal prostate, no masses or tenderness;   guaiac negative stool  Extremities:   Extremities normal, atraumatic, no cyanosis or edema  Pulses:   2+ and symmetric all extremities  Skin:   Skin color, texture, turgor normal, no rashes or lesions  Lymph nodes:   Cervical, supraclavicular, and axillary nodes normal  Neurologic:   CNII-XII intact. Normal strength, sensation and reflexes      throughout     Results for orders placed or performed in visit on  04/04/20  POCT INR  Result Value Ref Range   INR 2.6 2.0 - 3.0   PT 30.7     Assessment & Plan     1. AF (paroxysmal atrial fibrillation) ([place]) Patient has been on Coumadin well controlled for years. - POCT INR  2. Longstanding persistent atrial fibrillation ([place])   3. Essential (primary) hypertension    Return in about 3 months (around 07/05/2020) for AWV.         Allisson Schindel Cranford Mon, MD  [facility] [phone] (phone) [phone] (fax)  [place]

## 2020-04-04 ENCOUNTER — Encounter: Payer: Self-pay | Admitting: Family Medicine

## 2020-04-04 ENCOUNTER — Ambulatory Visit (INDEPENDENT_AMBULATORY_CARE_PROVIDER_SITE_OTHER): Payer: Medicare HMO | Admitting: Family Medicine

## 2020-04-04 ENCOUNTER — Other Ambulatory Visit: Payer: Self-pay

## 2020-04-04 VITALS — BP 122/63 | HR 53 | Temp 98.1°F | Resp 18 | Ht 70.0 in | Wt 199.0 lb

## 2020-04-04 DIAGNOSIS — I4811 Longstanding persistent atrial fibrillation: Secondary | ICD-10-CM | POA: Diagnosis not present

## 2020-04-04 DIAGNOSIS — I10 Essential (primary) hypertension: Secondary | ICD-10-CM | POA: Diagnosis not present

## 2020-04-04 DIAGNOSIS — I480 Paroxysmal atrial fibrillation: Secondary | ICD-10-CM

## 2020-04-04 LAB — POCT INR
INR: 2.6 (ref 2.0–3.0)
PT: 30.7

## 2020-04-04 NOTE — Patient Instructions (Signed)
No changes made today: Take 4 mg M,W,F and 5 mg all other days. Recheck in 4 weeks. 05/02/2020

## 2020-04-04 NOTE — Progress Notes (Deleted)
Subjective:   Casey Gallegos is a 85 y.o. male who presents for Medicare Annual/Subsequent preventive examination.  I connected with Halden Phegley today by telephone and verified that I am speaking with the correct person using two identifiers. Location patient: home Location provider: work Persons participating in the virtual visit: patient, provider.   I discussed the limitations, risks, security and privacy concerns of performing an evaluation and management service by telephone and the availability of in person appointments. I also discussed with the patient that there may be a patient responsible charge related to this service. The patient expressed understanding and verbally consented to this telephonic visit.    Interactive audio and video telecommunications were attempted between this provider and patient, however failed, due to patient having technical difficulties OR patient did not have access to video capability.  We continued and completed visit with audio only.   Review of Systems    N/A        Objective:    There were no vitals filed for this visit. There is no height or weight on file to calculate BMI.  Advanced Directives 03/16/2019 03/10/2018 01/13/2017 04/13/2016 01/11/2016 10/18/2015  Does Patient Have a Medical Advance Directive? Yes Yes Yes No Yes Yes  Type of Paramedic of [place];Living will [place];Living will Living will;Healthcare Power of [place];Living will  Copy of [place] in Chart? No - copy requested No - copy requested No - copy requested - - -  Would patient like information on creating a medical advance directive? - - - Yes - Educational materials given - -    Current Medications (verified) Outpatient Encounter Medications as of 04/05/2020  Medication Sig  . amLODipine (NORVASC) 5 MG tablet TAKE 1 TABLET EVERY DAY  . benazepril (LOTENSIN) 40 MG tablet TAKE  1 TABLET EVERY DAY  . Carboxymethylcellulose Sodium (LUBRICANT EYE DROPS OP) as needed.  (Patient not taking: Reported on 04/04/2020)  . carvedilol (COREG) 6.25 MG tablet TAKE 1 TABLET TWICE DAILY  . CORTIZONE-10 DIABETICS SKIN 1 % lotion Apply 1 application topically as needed.   . doxazosin (CARDURA) 8 MG tablet TAKE 1 TABLET EVERY DAY  . finasteride (PROSCAR) 5 MG tablet TAKE 1 TABLET EVERY DAY  . gabapentin (NEURONTIN) 300 MG capsule TAKE 1 CAPSULE EVERY MORNING, TAKE 1 IN THE AFTERNOON AND TAKE 2 CAPSULES AT BED TIME  . Magnesium 500 MG TABS Take by mouth daily.   . Multiple Vitamin (MULTI-VITAMIN) tablet Take 1 tablet by mouth daily.   . MULTIPLE VITAMIN PO Take by mouth daily.   Marland Kitchen oxyCODONE (OXY IR/ROXICODONE) 5 MG immediate release tablet Take 1 tablet (5 mg total) by mouth every 4 (four) hours as needed for severe pain.  Marland Kitchen triamterene-hydrochlorothiazide (MAXZIDE-25) 37.5-25 MG tablet TAKE 1/2 TABLET EVERY DAY  . warfarin (COUMADIN) 1 MG tablet Take by mouth.  . warfarin (COUMADIN) 4 MG tablet TAKE 1 TABLET EVERY OTHER DAY AT 6PM ALTERNATING WITH 5MG   . warfarin (COUMADIN) 5 MG tablet TAKE 1 TABLET EVERY DAY  AT  6PM   No facility-administered encounter medications on file as of 04/05/2020.    Allergies (verified) Cefdinir, Doxycycline, Prednisone, and Sertraline   History: Past Medical History:  Diagnosis Date  . A-fib ([place])   . Anemia   . DVT (deep venous thrombosis) ([facility])    Monitor with PT/INR. Target 2.0 - 3.0   . Elevated PSA   . H/O  degenerative disc disease   . Hypertension   . Lipomatosis   . Night sweats   . Obesity    Past Surgical History:  Procedure Laterality Date  . Bilateral Radical Keratotomy Bilateral 1997  . COLONOSCOPY WITH PROPOFOL N/A 01/11/2016   Procedure: COLONOSCOPY WITH PROPOFOL;  Surgeon: Manya Silvas, MD;  Location: [facility] ENDOSCOPY;  Service: Endoscopy;  Laterality: N/A; repeat 3 years, tubular adenoma  . deep vein thrombosis    . DENTAL  SURGERY     Patient had implants  . GUM SURGERY    . L4 - S1 decompression Left    Left-sided  . PROSTATE BIOPSY    . TONSILLECTOMY AND ADENOIDECTOMY  age 52   Family History  Problem Relation Age of Onset  . Cancer Mother        secondary to cancinoma of the jaw from her dipping snuff.  . Heart attack Father   . CAD Father   . Hypertension Sister   . Diabetes Son        Type I diabetes  . Prostate cancer Neg Hx   . Kidney cancer Neg Hx    Social History   Socioeconomic History  . Marital status: Married    Spouse name: Not on file  . Number of children: 3  . Years of education: Not on file  . Highest education level: Associate degree: occupational, Hotel manager, or vocational program  Occupational History  . Occupation: retired  Tobacco Use  . Smoking status: Former Smoker    Quit date: 08/26/1971    Years since quitting: 48.6  . Smokeless tobacco: Never Used  . Tobacco comment: Smoked for about 20 years.  Vaping Use  . Vaping Use: Never used  Substance and Sexual Activity  . Alcohol use: Not Currently  . Drug use: No  . Sexual activity: Not on file  Other Topics Concern  . Not on file  Social History Narrative  . Not on file   Social Determinants of Health   Financial Resource Strain:   . Difficulty of Paying Living Expenses:   Food Insecurity:   . Worried About Charity fundraiser in the Last Year:   . Arboriculturist in the Last Year:   Transportation Needs:   . Film/video editor (Medical):   Marland Kitchen Lack of Transportation (Non-Medical):   Physical Activity:   . Days of Exercise per Week:   . Minutes of Exercise per Session:   Stress:   . Feeling of Stress :   Social Connections:   . Frequency of Communication with Friends and Family:   . Frequency of Social Gatherings with Friends and Family:   . Attends Religious Services:   . Active Member of Clubs or Organizations:   . Attends Archivist Meetings:   Marland Kitchen Marital Status:     Tobacco  Counseling Counseling given: Not Answered Comment: Smoked for about 20 years.   Clinical Intake:                 Diabetic? No         Activities of Daily Living No flowsheet data found.  Patient Care Team: Jerrol Banana., MD as PCP - General (Family Medicine) Hollice Espy, MD as Consulting Physician (Urology) Corey Skains, MD as Consulting Physician (Cardiology) Leandrew Koyanagi, MD as Referring Physician (Ophthalmology)  Indicate any recent Medical Services you may have received from other than [facility] providers in the past year (date may be approximate).  Assessment:   This is a routine wellness examination for Mohammed[place].  Hearing/Vision screen No exam data present  Dietary issues and exercise activities discussed:    Goals    . DIET - INCREASE WATER INTAKE     Recommend increasing water intake to 6-8 glasses a day.       Depression Screen PHQ 2/9 Scores 03/16/2019 03/10/2018 01/13/2017 04/15/2016 10/18/2015  PHQ - 2 Score 0 2 2 1  0  PHQ- 9 Score - 5 5 4  -    Fall Risk Fall Risk  03/16/2019 12/28/2018 03/10/2018 01/13/2017 04/15/2016  Falls in the past year? 0 0 No No No    Any stairs in or around the home? {YES/NO:21197} If so, are there any without handrails? {YES/NO:21197} Home free of loose throw rugs in walkways, pet beds, electrical cords, etc? Yes  Adequate lighting in your home to reduce risk of falls? Yes   ASSISTIVE DEVICES UTILIZED TO PREVENT FALLS:  Life alert? {YES/NO:21197} Use of a cane, walker or w/c? {YES/NO:21197} Grab bars in the bathroom? {YES/NO:21197} Shower chair or bench in shower? {YES/NO:21197} Elevated toilet seat or a handicapped toilet? {YES/NO:21197}  Cognitive Function: MMSE - Mini Mental State Exam 04/15/2016  Orientation to time 4  Orientation to Place 5  Registration 3  Attention/ Calculation 5  Recall 1  Language- name 2 objects 2  Language- repeat 1  Language- follow 3 step command 3    Language- read & follow direction 1  Write a sentence 1  Copy design 1  Total score 27     6CIT Screen 01/13/2017  What Year? 0 points  What month? 0 points  What time? 0 points  Count back from 20 0 points  Months in reverse 0 points  Repeat phrase 2 points  Total Score 2    Immunizations Immunization History  Administered Date(s) Administered  . Fluad Quad(high Dose 65+) 05/10/2019  . Influenza, High Dose Seasonal PF 05/11/2015, 05/21/2016, 05/27/2017, 05/26/2018  . PFIZER SARS-COV-2 Vaccination 09/09/2019, 09/30/2019  . Pneumococcal Conjugate-13 01/09/2014  . Pneumococcal Polysaccharide-23 08/08/2004  . Td 10/25/2008    TDAP status: Due, Education has been provided regarding the importance of this vaccine. Advised may receive this vaccine at local pharmacy or Health Dept. Aware to provide a copy of the vaccination record if obtained from local pharmacy or Health Dept. Verbalized acceptance and understanding. Flu Vaccine status: Up to date Pneumococcal vaccine status: Up to date Covid-19 vaccine status: Completed vaccines  Qualifies for Shingles Vaccine? Yes   Zostavax completed No   Shingrix Completed?: No.    Education has been provided regarding the importance of this vaccine. Patient has been advised to call insurance company to determine out of pocket expense if they have not yet received this vaccine. Advised may also receive vaccine at local pharmacy or Health Dept. Verbalized acceptance and understanding.  Screening Tests Health Maintenance  Topic Date Due  . TETANUS/TDAP  10/26/2018  . INFLUENZA VACCINE  03/25/2020  . COVID-19 Vaccine  Completed  . PNA vac Low Risk Adult  Completed    Health Maintenance  Health Maintenance Due  Topic Date Due  . TETANUS/TDAP  10/26/2018  . INFLUENZA VACCINE  03/25/2020    Colorectal cancer screening: No longer required.    Additional Screening:  Vision Screening: Recommended annual ophthalmology exams for early  detection of glaucoma and other disorders of the eye. Is the patient up to date with their annual eye exam?  Yes  Who is the provider or  what is the name of the office in which the patient attends annual eye exams? *** If pt is not established with a provider, would they like to be referred to a provider to establish care? No .   Dental Screening: Recommended annual dental exams for proper oral hygiene  Community Resource Referral / Chronic Care Management: CRR required this visit?  No   CCM required this visit?  No      Plan:     I have personally reviewed and noted the following in the patient's chart:   . Medical and social history . Use of alcohol, tobacco or illicit drugs  . Current medications and supplements . Functional ability and status . Nutritional status . Physical activity . Advanced directives . List of other physicians . Hospitalizations, surgeries, and ER visits in previous 12 months . Vitals . Screenings to include cognitive, depression, and falls . Referrals and appointments  In addition, I have reviewed and discussed with patient certain preventive protocols, quality metrics, and best practice recommendations. A written personalized care plan for preventive services as well as general preventive health recommendations were provided to patient.     Eldene Plocher [place], [place]   [id number]   Nurse Notes: ***

## 2020-04-05 ENCOUNTER — Telehealth: Payer: Self-pay

## 2020-04-05 ENCOUNTER — Other Ambulatory Visit: Payer: Self-pay

## 2020-04-05 NOTE — Telephone Encounter (Signed)
LMTCB to r/s missed AWV. Direct # left to CB on.

## 2020-04-10 ENCOUNTER — Ambulatory Visit: Payer: Self-pay

## 2020-04-18 ENCOUNTER — Telehealth: Payer: Self-pay | Admitting: Family Medicine

## 2020-04-18 NOTE — Telephone Encounter (Signed)
Copied from [place] [id number]. Topic: Medicare AWV >> Apr 18, 2020  4:39 PM Cher Nakai R wrote: Reason for CRM:  Left message for patient to call back and schedule Medicare Annual Wellness Visit (AWV) either virtually or in office.  Last AWV 03/16/2019  Please schedule at anytime with [facility] Health Advisor.  If any questions, please contact me at [phone]

## 2020-04-24 ENCOUNTER — Other Ambulatory Visit: Payer: Self-pay | Admitting: Family Medicine

## 2020-04-24 NOTE — Telephone Encounter (Signed)
Requested medication (s) are due for refill today: yes  Requested medication (s) are on the active medication list: yes  Last refill:  03/27/2020  Future visit scheduled: yes   Notes to clinic:  This refill cannot be delegated   Requested Prescriptions  Pending Prescriptions Disp Refills   warfarin (COUMADIN) 5 MG tablet [Pharmacy Med Name: WARFARIN SODIUM 5 MG Tablet] 90 tablet 1    Sig: TAKE 1 TABLET EVERY DAY  AT  [place]      Hematology:  Anticoagulants - warfarin Failed - 04/24/2020  9:12 AM      Failed - This refill cannot be delegated      Failed - If the patient is managed by Coumadin Clinic - route to their Pool. If not, forward to the provider.      Passed - INR in normal range and within 30 days    INR  Date Value Ref Range Status  04/04/2020 2.6 2.0 - 3.0 Final  07/08/2017 1.8 (H)  Final    Comment:    Reference Range                     0.9-1.1 Moderate-intensity Warfarin Therapy 2.0-3.0 Higher-intensity Warfarin Therapy   3.0-4.0  .           Passed - Valid encounter within last 3 months    Recent Outpatient Visits           2 weeks ago AF (paroxysmal atrial fibrillation) [facility])   [facility] Jerrol Banana., MD   3 months ago AF (paroxysmal atrial fibrillation) [facility])   [facility] Jerrol Banana., MD   9 months ago Annual physical exam   [facility] Jerrol Banana., MD   1 year ago Essential hypertension   [facility] Jerrol Banana., MD   2 years ago Essential (primary) hypertension   [facility] Jerrol Banana., MD       Future Appointments             In 4 months McGowan, Gordan Payment [facility]             Signed Prescriptions Disp Refills   benazepril (LOTENSIN) 40 MG tablet 90 tablet 1    Sig: TAKE 1 TABLET EVERY DAY      Cardiovascular:  ACE Inhibitors Passed - 04/24/2020  9:12 AM      Passed - Cr in  normal range and within 180 days    Creatinine, Ser  Date Value Ref Range Status  01/04/2020 1.09 0.76 - 1.27 mg/dL Final          Passed - K in normal range and within 180 days    Potassium  Date Value Ref Range Status  01/04/2020 4.1 3.5 - 5.2 mmol/L Final          Passed - Patient is not pregnant      Passed - Last BP in normal range    BP Readings from Last 1 Encounters:  04/04/20 122/63          Passed - Valid encounter within last 6 months    Recent Outpatient Visits           2 weeks ago AF (paroxysmal atrial fibrillation) [facility])   [facility] Jerrol Banana., MD   3 months ago AF (paroxysmal atrial fibrillation) [facility])   [facility] Post  Brooke Bonito., MD   9 months ago Annual physical exam   [facility] Jerrol Banana., MD   1 year ago Essential hypertension   [facility] Jerrol Banana., MD   2 years ago Essential (primary) hypertension   [facility] Jerrol Banana., MD       Future Appointments             In 4 months McGowan, Gordan Payment [facility]

## 2020-05-01 ENCOUNTER — Ambulatory Visit (INDEPENDENT_AMBULATORY_CARE_PROVIDER_SITE_OTHER): Payer: Medicare HMO

## 2020-05-01 ENCOUNTER — Other Ambulatory Visit: Payer: Self-pay

## 2020-05-01 DIAGNOSIS — M5136 Other intervertebral disc degeneration, lumbar region: Secondary | ICD-10-CM

## 2020-05-01 DIAGNOSIS — I480 Paroxysmal atrial fibrillation: Secondary | ICD-10-CM | POA: Diagnosis not present

## 2020-05-01 DIAGNOSIS — I482 Chronic atrial fibrillation, unspecified: Secondary | ICD-10-CM

## 2020-05-01 DIAGNOSIS — Z23 Encounter for immunization: Secondary | ICD-10-CM | POA: Diagnosis not present

## 2020-05-01 LAB — POCT INR
INR: 2.4 (ref 2.0–3.0)
PT: 28.4

## 2020-05-01 NOTE — Patient Instructions (Signed)
Description   No changes made today: Take 4 mg M,W,F and 5 mg all other days. Recheck in 4 weeks.

## 2020-05-02 MED ORDER — OXYCODONE HCL 5 MG PO TABS: 5.0000 mg | ORAL_TABLET | ORAL | 0 refills | Status: DC | PRN

## 2020-05-08 ENCOUNTER — Telehealth: Payer: Self-pay

## 2020-05-08 NOTE — Telephone Encounter (Signed)
Per Dr. Serafina Royals, patient to stop Warfarin three days prior to surgery. Patient informed.

## 2020-05-14 ENCOUNTER — Other Ambulatory Visit: Payer: Self-pay | Admitting: Urology

## 2020-05-15 ENCOUNTER — Other Ambulatory Visit: Payer: Self-pay

## 2020-05-15 ENCOUNTER — Ambulatory Visit: Payer: Medicare HMO | Admitting: Dermatology

## 2020-05-15 ENCOUNTER — Telehealth: Payer: Self-pay

## 2020-05-15 DIAGNOSIS — D1721 Benign lipomatous neoplasm of skin and subcutaneous tissue of right arm: Secondary | ICD-10-CM

## 2020-05-15 DIAGNOSIS — L570 Actinic keratosis: Secondary | ICD-10-CM

## 2020-05-15 MED ORDER — MUPIROCIN 2 % EX OINT: 1.0000 "application " | TOPICAL_OINTMENT | Freq: Every day | CUTANEOUS | 0 refills | Status: DC

## 2020-05-15 NOTE — Patient Instructions (Addendum)
  Restart Warfarin on Friday 05/18/20     Wound Care Instructions  1. Cleanse wound gently with soap and water once a day then pat dry with clean gauze. Apply a thing coat of Petrolatum (petroleum jelly, "Vaseline") over the wound (unless you have an allergy to this). We recommend that you use a new, sterile tube of Vaseline. Do not pick or remove scabs. Do not remove the yellow or white "healing tissue" from the base of the wound.  2. Cover the wound with fresh, clean, nonstick gauze and secure with paper tape. You may use Band-Aids in place of gauze and tape if the would is small enough, but would recommend trimming much of the tape off as there is often too much. Sometimes Band-Aids can irritate the skin.  3. You should call the office for your biopsy report after 1 week if you have not already been contacted.  4. If you experience any problems, such as abnormal amounts of bleeding, swelling, significant bruising, significant pain, or evidence of infection, please call the office immediately.  5. FOR ADULT SURGERY PATIENTS: If you need something for pain relief you may take 1 extra strength Tylenol (acetaminophen) AND 2 Ibuprofen (200mg  each) together every 4 hours as needed for pain. (do not take these if you are allergic to them or if you have a reason you should not take them.) Typically, you may only need pain medication for 1 to 3 days.

## 2020-05-15 NOTE — Progress Notes (Signed)
Follow-Up Visit   Subjective  Casey Gallegos is a 85 y.o. male who presents for the following: Lipoma (R anticubital, pt presents for excision) and Actinic Keratosis (L post neck, txted with LN2 03/22/20, pt states not ).  The following portions of the chart were reviewed this encounter and updated as appropriate:  Tobacco  Allergies  Meds  Problems  Med Hx  Surg Hx  Fam Hx     Review of Systems:  No other skin or systemic complaints except as noted in HPI or Assessment and Plan.  Objective  Well appearing patient in no apparent distress; mood and affect are within normal limits.  A focused examination was performed including R arm. Relevant physical exam findings are noted in the Assessment and Plan.  Objective    Right Antecubital: Rubbery nodule 4.1 x 2.0cm  R tricep, R deltoid: Rubbery nodules  Objective  L posterior neck: Residual pink scaly macules   Assessment & Plan  Lipoma of right upper extremity (2) Right Antecubital; R tricep, R deltoid  Discussed excising lipomas of R tricep and R deltoid in 2 surgeries  Start Mupirocin oint qd to excision site  Pt to restart warfarin on Friday 05/18/20  Skin excision - Right Antecubital  Lesion length (cm):  4.1 Lesion width (cm):  2 Margin per side (cm):  0 Total excision diameter (cm):  4.1 Informed consent: discussed and consent obtained   Timeout: patient name, date of birth, surgical site, and procedure verified   Procedure prep:  Patient was prepped and draped in usual sterile fashion Prep type:  Isopropyl alcohol and povidone-iodine Anesthesia: the lesion was anesthetized in a standard fashion   Anesthetic:  1% lidocaine w/ epinephrine 1-100,000 buffered w/ 8.4% NaHCO3 Instrument used: #15 blade   Hemostasis achieved with: pressure   Hemostasis achieved with comment:  Electrocautery Outcome: patient tolerated procedure well with no complications   Post-procedure details: sterile dressing applied and  wound care instructions given   Dressing type: bandage and pressure dressing (Mupirocin)    Skin repair - Right Antecubital Complexity:  Complex Final length (cm):  3 Reason for type of repair: reduce tension to allow closure, reduce the risk of dehiscence, infection, and necrosis, reduce subcutaneous dead space and avoid a hematoma, allow closure of the large defect, preserve normal anatomy, preserve normal anatomical and functional relationships and enhance both functionality and cosmetic results   Undermining: area extensively undermined   Undermining comment:  Undermining Defect 2.5 Subcutaneous layers (deep stitches):  Suture size:  4-0 Suture type: Vicryl (polyglactin 910)   Subcutaneous suture technique: Inverted Dermal. Fine/surface layer approximation (top stitches):  Suture size:  4-0 Suture type: nylon   Stitches: horizontal mattress   Stitches comment:  Nylon Suture removal (days):  7 Hemostasis achieved with: pressure Outcome: patient tolerated procedure well with no complications   Post-procedure details: sterile dressing applied and wound care instructions given   Dressing type: bandage and pressure dressing (Mupirocin)   Additional details:  Horizontal mattress x 3  mupirocin ointment (BACTROBAN) 2 % - Right Antecubital  Specimen 1 - Surgical pathology Differential Diagnosis: D48.5 Lipoma vs othr Check Margins: No Rubbery nodule 4.2 x 2.0cm  AK (actinic keratosis) L posterior neck  Plan Ln2 on f/u  Return in about 1 week (around 05/22/2020) for suture removal, and txt AKs L post neck, surgeries x 2 for Lipomas.   I, [name], RMA, am acting as scribe for Sarina Ser, MD .  Documentation: I have  reviewed the above documentation for accuracy and completeness, and I agree with the above.  Sarina Ser, MD

## 2020-05-15 NOTE — Telephone Encounter (Signed)
Left pt msg to call if any problems after today's surgery.Casey Gallegos

## 2020-05-16 ENCOUNTER — Encounter: Payer: Self-pay | Admitting: Dermatology

## 2020-05-18 ENCOUNTER — Other Ambulatory Visit: Payer: Self-pay | Admitting: Family Medicine

## 2020-05-22 ENCOUNTER — Ambulatory Visit (INDEPENDENT_AMBULATORY_CARE_PROVIDER_SITE_OTHER): Payer: Medicare HMO | Admitting: Dermatology

## 2020-05-22 ENCOUNTER — Encounter: Payer: Self-pay | Admitting: Dermatology

## 2020-05-22 ENCOUNTER — Other Ambulatory Visit: Payer: Self-pay

## 2020-05-22 DIAGNOSIS — Z4802 Encounter for removal of sutures: Secondary | ICD-10-CM

## 2020-05-22 DIAGNOSIS — L570 Actinic keratosis: Secondary | ICD-10-CM

## 2020-05-22 DIAGNOSIS — D1721 Benign lipomatous neoplasm of skin and subcutaneous tissue of right arm: Secondary | ICD-10-CM

## 2020-05-22 NOTE — Progress Notes (Signed)
   Follow-Up Visit   Subjective  Casey Gallegos is a 85 y.o. male who presents for the following: Lipoma bx proven post op (R anticubitum, 1 wk f/u, pt presents for suture removal) and Actinic Keratosis (L post neck, residual aks to txt). He would like a spot of the right shoulder checked.  He thinks it is a lipoma and wonders about excision on this.  The following portions of the chart were reviewed this encounter and updated as appropriate:  Tobacco  Allergies  Meds  Problems  Med Hx  Surg Hx  Fam Hx     Review of Systems:  No other skin or systemic complaints except as noted in HPI or Assessment and Plan.  Objective  Well appearing patient in no apparent distress; mood and affect are within normal limits.  A focused examination was performed including neck, R arm. Relevant physical exam findings are noted in the Assessment and Plan.  Objective  L post neck x 4 (4): Pink scaly macules   R anterior deltoid; Right anticubital  Objective  R anterior deltoid: 4.2cm rubbery nodule  Right anticubital: Healing excision site   Assessment & Plan  AK (actinic keratosis) (4) L post neck x 4  Destruction of lesion - L post neck x 4 Complexity: simple   Destruction method: cryotherapy   Informed consent: discussed and consent obtained   Timeout:  patient name, date of birth, surgical site, and procedure verified Lesion destroyed using liquid nitrogen: Yes   Region frozen until ice ball extended beyond lesion: Yes   Outcome: patient tolerated procedure well with no complications   Post-procedure details: wound care instructions given    Lipoma of right upper extremity (2) R anterior deltoid; Right anticubital  Bx proven, R anticubital  Healing excision site Wound cleansed, sutures removed, wound cleansed and steri strips applied. Discussed pathology results.   R ant deltoid will schedule pt for excision  Other Related Medications mupirocin ointment (BACTROBAN) 2  %  Return for to be scheduled for surgery  Lipoma of R ant deltoid.  I, [name], RMA, am acting as scribe for Sarina Ser, MD .  Documentation: I have reviewed the above documentation for accuracy and completeness, and I agree with the above.  Sarina Ser, MD

## 2020-05-23 ENCOUNTER — Telehealth: Payer: Self-pay

## 2020-05-23 NOTE — Telephone Encounter (Signed)
Copied from [place] [id number]. Topic: General - Other >> May 23, 2020  1:45 PM [name] wrote: Patient would like clarification on if he can get his inr checked after his AWV. Please advise

## 2020-05-28 NOTE — Telephone Encounter (Signed)
This encounter was created in error - please disregard.

## 2020-05-28 NOTE — Progress Notes (Signed)
Subjective:   Casey Gallegos is a 85 y.o. male who presents for Medicare Annual/Subsequent preventive examination.  I connected with Casey Gallegos today by telephone and verified that I am speaking with the correct person using two identifiers. Location patient: home Location provider: work Persons participating in the virtual visit: patient, provider.   I discussed the limitations, risks, security and privacy concerns of performing an evaluation and management service by telephone and the availability of in person appointments. I also discussed with the patient that there may be a patient responsible charge related to this service. The patient expressed understanding and verbally consented to this telephonic visit.    Interactive audio and video telecommunications were attempted between this provider and patient, however failed, due to patient having technical difficulties OR patient did not have access to video capability.  We continued and completed visit with audio only.   Review of Systems    N/A  Cardiac Risk Factors include: advanced age (>22men, >20 women);hypertension;male gender     Objective:    There were no vitals filed for this visit. There is no height or weight on file to calculate BMI.  Advanced Directives 05/29/2020 03/16/2019 03/10/2018 01/13/2017 04/13/2016 01/11/2016 10/18/2015  Does Patient Have a Medical Advance Directive? Yes Yes Yes Yes No Yes Yes  Type of Paramedic of [place];Living will [place];Living will [place];Living will Living will;Healthcare Power of [place];Living will  Copy of Casey Gallegos in Chart? Yes - validated most recent copy scanned in chart (See row information) No - copy requested No - copy requested No - copy requested - - -  Would patient like information on creating a medical advance directive? No - Patient declined - - - Yes -  Educational materials given - -    Current Medications (verified) Outpatient Encounter Medications as of 05/29/2020  Medication Sig  . amLODipine (NORVASC) 5 MG tablet TAKE 1 TABLET EVERY DAY  . benazepril (LOTENSIN) 40 MG tablet TAKE 1 TABLET EVERY DAY  . Carboxymethylcellulose Sodium (LUBRICANT EYE DROPS OP) as needed.   . carvedilol (COREG) 6.25 MG tablet TAKE 1 TABLET TWICE DAILY  . CORTIZONE-10 DIABETICS SKIN 1 % lotion Apply 1 application topically as needed.   . doxazosin (CARDURA) 8 MG tablet TAKE 1 TABLET EVERY DAY  . finasteride (PROSCAR) 5 MG tablet TAKE 1 TABLET EVERY DAY  . gabapentin (NEURONTIN) 300 MG capsule TAKE 1 CAPSULE EVERY MORNING, TAKE 1 IN THE AFTERNOON AND TAKE 2 CAPSULES AT BED TIME  . Magnesium 500 MG TABS Take by mouth daily.   . Multiple Vitamin (MULTI-VITAMIN) tablet Take 1 tablet by mouth daily.   Marland Kitchen oxyCODONE (OXY IR/ROXICODONE) 5 MG immediate release tablet Take 1 tablet (5 mg total) by mouth every 4 (four) hours as needed for severe pain.  Marland Kitchen triamterene-hydrochlorothiazide (MAXZIDE-25) 37.5-25 MG tablet TAKE 1/2 TABLET EVERY DAY  . warfarin (COUMADIN) 5 MG tablet TAKE 1 TABLET EVERY DAY  AT  6PM  . MULTIPLE VITAMIN PO Take by mouth daily.  (Patient not taking: Reported on 05/29/2020)  . mupirocin ointment (BACTROBAN) 2 % Apply 1 application topically daily. Qd to excision site on right arm (Patient not taking: Reported on 05/29/2020)  . warfarin (COUMADIN) 1 MG tablet Take by mouth. (Patient not taking: Reported on 05/29/2020)  . warfarin (COUMADIN) 4 MG tablet TAKE 1 TABLET EVERY OTHER DAY AT 6PM ALTERNATING WITH 5MG  (Patient not taking:  Reported on 05/29/2020)   No facility-administered encounter medications on file as of 05/29/2020.    Allergies (verified) Cefdinir, Doxycycline, Prednisone, and Sertraline   History: Past Medical History:  Diagnosis Date  . A-fib ([place])   . Anemia   . DVT (deep venous thrombosis) ([facility])    Monitor with PT/INR. Target 2.0  - 3.0   . Elevated PSA   . H/O degenerative disc disease   . Hypertension   . Lipoma of arm   . Lipomatosis   . Night sweats   . Obesity    Past Surgical History:  Procedure Laterality Date  . Bilateral Radical Keratotomy Bilateral 1997  . COLONOSCOPY WITH PROPOFOL N/A 01/11/2016   Procedure: COLONOSCOPY WITH PROPOFOL;  Surgeon: Manya Silvas, MD;  Location[facility] ENDOSCOPY;  Service: Endoscopy;  Laterality: N/A; repeat 3 years, tubular adenoma  . deep vein thrombosis    . DENTAL SURGERY     Patient had implants  . GUM SURGERY    . L4 - S1 decompression Left    Left-sided  . PROSTATE BIOPSY    . TONSILLECTOMY AND ADENOIDECTOMY  age 23   Family History  Problem Relation Age of Onset  . Cancer Mother        secondary to cancinoma of the jaw from her dipping snuff.  . Heart attack Father   . CAD Father   . Hypertension Sister   . Diabetes Son        Type I diabetes  . Prostate cancer Neg Hx   . Kidney cancer Neg Hx    Social History   Socioeconomic History  . Marital status: Married    Spouse name: Not on file  . Number of children: 3  . Years of education: Not on file  . Highest education level: Associate degree: occupational, Hotel manager, or vocational program  Occupational History  . Occupation: retired  Tobacco Use  . Smoking status: Former Smoker    Quit date: 08/26/1971    Years since quitting: 48.7  . Smokeless tobacco: Never Used  . Tobacco comment: Smoked for about 20 years.  Vaping Use  . Vaping Use: Never used  Substance and Sexual Activity  . Alcohol use: Not Currently  . Drug use: No  . Sexual activity: Not on file  Other Topics Concern  . Not on file  Social History Narrative  . Not on file   Social Determinants of Health   Financial Resource Strain: Low Risk   . Difficulty of Paying Living Expenses: Not hard at all  Food Insecurity: No Food Insecurity  . Worried About Charity fundraiser in the Last Year: Never true  . Ran Out of Food in the  Last Year: Never true  Transportation Needs: No Transportation Needs  . Lack of Transportation (Medical): No  . Lack of Transportation (Non-Medical): No  Physical Activity: Sufficiently Active  . Days of Exercise per Week: 6 days  . Minutes of Exercise per Session: 60 min  Stress: No Stress Concern Present  . Feeling of Stress : Not at all  Social Connections: Moderately Integrated  . Frequency of Communication with Friends and Family: More than three times a week  . Frequency of Social Gatherings with Friends and Family: More than three times a week  . Attends Religious Services: More than 4 times per year  . Active Member of Clubs or Organizations: No  . Attends Archivist Meetings: Never  . Marital Status: Married    Tobacco  Counseling Counseling given: Not Answered Comment: Smoked for about 20 years.   Clinical Intake:  Pre-visit preparation completed: Yes  Pain : No/denies pain     Nutritional Risks: None Diabetes: No  How often do you need to have someone help you when you read instructions, pamphlets, or other written materials from your doctor or pharmacy?: 1 - Never  Diabetic? No  Interpreter Needed?: No  Information entered by :: [place], LPN   Activities of Daily Living In your present state of health, do you have any difficulty performing the following activities: 05/29/2020  Hearing? N  Vision? N  Difficulty concentrating or making decisions? N  Walking or climbing stairs? N  Dressing or bathing? N  Doing errands, shopping? N  Preparing Food and eating ? N  Using the Toilet? N  In the past six months, have you accidently leaked urine? N  Do you have problems with loss of bowel control? N  Managing your Medications? N  Managing your Finances? N  Housekeeping or managing your Housekeeping? N  Some recent data might be hidden    Patient Care Team: Jerrol Banana., MD as PCP - General (Family Medicine) Hollice Espy, MD as  Consulting Physician (Urology) Corey Skains, MD as Consulting Physician (Cardiology) Leandrew Koyanagi, MD as Referring Physician (Ophthalmology) Ralene Bathe, MD (Dermatology)  Indicate any recent Medical Services you may have received from other than [facility] providers in the past year (date may be approximate).     Assessment:   This is a routine wellness examination for Jermaine.  Hearing/Vision screen No exam data present  Dietary issues and exercise activities discussed: Current Exercise Habits: Structured exercise class, Type of exercise: treadmill;strength training/weights;stretching, Time (Minutes): 60, Frequency (Times/Week): 6, Weekly Exercise (Minutes/Week): 360, Intensity: Mild, Exercise limited by: None identified  Goals    . DIET - INCREASE WATER INTAKE     Recommend increasing water intake to 6-8 glasses a day.       Depression Screen PHQ 2/9 Scores 05/29/2020 03/16/2019 03/10/2018 01/13/2017 04/15/2016 10/18/2015  PHQ - 2 Score 0 0 2 2 1  0  PHQ- 9 Score - - 5 5 4  -    Fall Risk Fall Risk  05/29/2020 03/16/2019 12/28/2018 03/10/2018 01/13/2017  Falls in the past year? 0 0 0 No No  Number falls in past yr: 0 - - - -  Injury with Fall? 0 - - - -    Any stairs in or around the home? No  If so, are there any without handrails? No  Home free of loose throw rugs in walkways, pet beds, electrical cords, etc? Yes  Adequate lighting in your home to reduce risk of falls? Yes   ASSISTIVE DEVICES UTILIZED TO PREVENT FALLS:  Life alert? No  Use of a cane, walker or w/c? No  Grab bars in the bathroom? No  Shower chair or bench in shower? No  Elevated toilet seat or a handicapped toilet? No    Cognitive Function: Declined today.   MMSE - Mini Mental State Exam 04/15/2016  Orientation to time 4  Orientation to Place 5  Registration 3  Attention/ Calculation 5  Recall 1  Language- name 2 objects 2  Language- repeat 1  Language- follow 3 step command 3  Language-  read & follow direction 1  Write a sentence 1  Copy design 1  Total score 27     6CIT Screen 01/13/2017  What Year? 0 points  What month? 0 points  What time? 0 points  Count back from 20 0 points  Months in reverse 0 points  Repeat phrase 2 points  Total Score 2    Immunizations Immunization History  Administered Date(s) Administered  . Fluad Quad(high Dose 65+) 05/10/2019  . Influenza, High Dose Seasonal PF 05/11/2015, 05/21/2016, 05/27/2017, 05/26/2018, 05/01/2020  . PFIZER SARS-COV-2 Vaccination 09/09/2019, 09/30/2019  . Pneumococcal Conjugate-13 01/09/2014  . Pneumococcal Polysaccharide-23 08/08/2004  . Td 10/25/2008    TDAP status: Due, Education has been provided regarding the importance of this vaccine. Advised may receive this vaccine at local pharmacy or Health Dept. Aware to provide a copy of the vaccination record if obtained from local pharmacy or Health Dept. Verbalized acceptance and understanding. Flu Vaccine status: Up to date Pneumococcal vaccine status: Up to date Covid-19 vaccine status: Completed vaccines  Qualifies for Shingles Vaccine? Yes   Zostavax completed No   Shingrix Completed?: No.    Education has been provided regarding the importance of this vaccine. Patient has been advised to call insurance company to determine out of pocket expense if they have not yet received this vaccine. Advised may also receive vaccine at local pharmacy or Health Dept. Verbalized acceptance and understanding.  Screening Tests Health Maintenance  Topic Date Due  . TETANUS/TDAP  10/26/2018  . INFLUENZA VACCINE  Completed  . COVID-19 Vaccine  Completed  . PNA vac Low Risk Adult  Completed    Health Maintenance  Health Maintenance Due  Topic Date Due  . TETANUS/TDAP  10/26/2018    Colorectal cancer screening: No longer required.   Lung Cancer Screening: (Low Dose CT Chest recommended if Age 43-80 years, 30 pack-year currently smoking OR have quit w/in  15years.) does not qualify.   Additional Screening:  Vision Screening: Recommended annual ophthalmology exams for early detection of glaucoma and other disorders of the eye. Is the patient up to date with their annual eye exam?  Yes  Who is the provider or what is the name of the office in which the patient attends annual eye exams? Dr Wallace Going @ [place] If pt is not established with a provider, would they like to be referred to a provider to establish care? No .   Dental Screening: Recommended annual dental exams for proper oral hygiene  Community Resource Referral / Chronic Care Management: CRR required this visit?  No   CCM required this visit?  No      Plan:     I have personally reviewed and noted the following in the patient's chart:   . Medical and social history . Use of alcohol, tobacco or illicit drugs  . Current medications and supplements . Functional ability and status . Nutritional status . Physical activity . Advanced directives . List of other physicians . Hospitalizations, surgeries, and ER visits in previous 12 months . Vitals . Screenings to include cognitive, depression, and falls . Referrals and appointments  In addition, I have reviewed and discussed with patient certain preventive protocols, quality metrics, and best practice recommendations. A written personalized care plan for preventive services as well as general preventive health recommendations were provided to patient.     Gesselle Fitzsimons [place], [place]   [id number]/08/[id number]   Nurse Notes: None.

## 2020-05-29 ENCOUNTER — Ambulatory Visit (INDEPENDENT_AMBULATORY_CARE_PROVIDER_SITE_OTHER): Payer: Medicare HMO

## 2020-05-29 ENCOUNTER — Other Ambulatory Visit: Payer: Self-pay

## 2020-05-29 DIAGNOSIS — I480 Paroxysmal atrial fibrillation: Secondary | ICD-10-CM | POA: Diagnosis not present

## 2020-05-29 DIAGNOSIS — Z0000 Encounter for general adult medical examination without abnormal findings: Secondary | ICD-10-CM

## 2020-05-29 LAB — POCT INR
INR: 1.6 — AB (ref 2.0–3.0)
PT: 18.7

## 2020-05-29 NOTE — Patient Instructions (Signed)
Casey Gallegos , Thank you for taking time to come for your Medicare Wellness Visit. I appreciate your ongoing commitment to your health goals. Please review the following plan we discussed and let me know if I can assist you in the future.   Screening recommendations/referrals: Colonoscopy: No longer required.  Recommended yearly ophthalmology/optometry visit for glaucoma screening and checkup Recommended yearly dental visit for hygiene and checkup  Vaccinations: Influenza vaccine: Done 05/01/20 Pneumococcal vaccine: Completed series Tdap vaccine: Currently due, declined at this time. Shingles vaccine: Shingrix discussed. Please contact your pharmacy for coverage information.     Advanced directives: Currently on file.  Conditions/risks identified: Recommend increasing water intake to 6-8 glasses a day.   Next appointment: 07/25/20 @ 9:20 AM with Dr Rosanna Randy. Declined scheduling an AWV for 2022 at this time.   Preventive Care 42 Years and Older, Male Preventive care refers to lifestyle choices and visits with your health care provider that can promote health and wellness. What does preventive care include?  A yearly physical exam. This is also called an annual well check.  Dental exams once or twice a year.  Routine eye exams. Ask your health care provider how often you should have your eyes checked.  Personal lifestyle choices, including:  Daily care of your teeth and gums.  Regular physical activity.  Eating a healthy diet.  Avoiding tobacco and drug use.  Limiting alcohol use.  Practicing safe sex.  Taking low doses of aspirin every day.  Taking vitamin and mineral supplements as recommended by your health care provider. What happens during an annual well check? The services and screenings done by your health care provider during your annual well check will depend on your age, overall health, lifestyle risk factors, and family history of disease. Counseling  Your health  care provider may ask you questions about your:  Alcohol use.  Tobacco use.  Drug use.  Emotional well-being.  Home and relationship well-being.  Sexual activity.  Eating habits.  History of falls.  Memory and ability to understand (cognition).  Work and work Statistician. Screening  You may have the following tests or measurements:  Height, weight, and BMI.  Blood pressure.  Lipid and cholesterol levels. These may be checked every 5 years, or more frequently if you are over 57 years old.  Skin check.  Lung cancer screening. You may have this screening every year starting at age 2 if you have a 30-pack-year history of smoking and currently smoke or have quit within the past 15 years.  Fecal occult blood test (FOBT) of the stool. You may have this test every year starting at age 62.  Flexible sigmoidoscopy or colonoscopy. You may have a sigmoidoscopy every 5 years or a colonoscopy every 10 years starting at age 20.  Prostate cancer screening. Recommendations will vary depending on your family history and other risks.  Hepatitis C blood test.  Hepatitis B blood test.  Sexually transmitted disease (STD) testing.  Diabetes screening. This is done by checking your blood sugar (glucose) after you have not eaten for a while (fasting). You may have this done every 1-3 years.  Abdominal aortic aneurysm (AAA) screening. You may need this if you are a current or former smoker.  Osteoporosis. You may be screened starting at age 38 if you are at high risk. Talk with your health care provider about your test results, treatment options, and if necessary, the need for more tests. Vaccines  Your health care provider may recommend certain vaccines,  such as:  Influenza vaccine. This is recommended every year.  Tetanus, diphtheria, and acellular pertussis (Tdap, Td) vaccine. You may need a Td booster every 10 years.  Zoster vaccine. You may need this after age  36.  Pneumococcal 13-valent conjugate (PCV13) vaccine. One dose is recommended after age 64.  Pneumococcal polysaccharide (PPSV23) vaccine. One dose is recommended after age 89. Talk to your health care provider about which screenings and vaccines you need and how often you need them. This information is not intended to replace advice given to you by your health care provider. Make sure you discuss any questions you have with your health care provider. Document Released: 09/07/2015 Document Revised: 04/30/2016 Document Reviewed: 06/12/2015 Elsevier Interactive Patient Education  2017 [place] Prevention in the Home Falls can cause injuries. They can happen to people of all ages. There are many things you can do to make your home safe and to help prevent falls. What can I do on the outside of my home?  Regularly fix the edges of walkways and driveways and fix any cracks.  Remove anything that might make you trip as you walk through a door, such as a raised step or threshold.  Trim any bushes or trees on the path to your home.  Use bright outdoor lighting.  Clear any walking paths of anything that might make someone trip, such as rocks or tools.  Regularly check to see if handrails are loose or broken. Make sure that both sides of any steps have handrails.  Any raised decks and porches should have guardrails on the edges.  Have any leaves, snow, or ice cleared regularly.  Use sand or salt on walking paths during winter.  Clean up any spills in your garage right away. This includes oil or grease spills. What can I do in the bathroom?  Use night lights.  Install grab bars by the toilet and in the tub and shower. Do not use towel bars as grab bars.  Use non-skid mats or decals in the tub or shower.  If you need to sit down in the shower, use a plastic, non-slip stool.  Keep the floor dry. Clean up any water that spills on the floor as soon as it happens.  Remove  soap buildup in the tub or shower regularly.  Attach bath mats securely with double-sided non-slip rug tape.  Do not have throw rugs and other things on the floor that can make you trip. What can I do in the bedroom?  Use night lights.  Make sure that you have a light by your bed that is easy to reach.  Do not use any sheets or blankets that are too big for your bed. They should not hang down onto the floor.  Have a firm chair that has side arms. You can use this for support while you get dressed.  Do not have throw rugs and other things on the floor that can make you trip. What can I do in the kitchen?  Clean up any spills right away.  Avoid walking on wet floors.  Keep items that you use a lot in easy-to-reach places.  If you need to reach something above you, use a strong step stool that has a grab bar.  Keep electrical cords out of the way.  Do not use floor polish or wax that makes floors slippery. If you must use wax, use non-skid floor wax.  Do not have throw rugs and other things on  the floor that can make you trip. What can I do with my stairs?  Do not leave any items on the stairs.  Make sure that there are handrails on both sides of the stairs and use them. Fix handrails that are broken or loose. Make sure that handrails are as long as the stairways.  Check any carpeting to make sure that it is firmly attached to the stairs. Fix any carpet that is loose or worn.  Avoid having throw rugs at the top or bottom of the stairs. If you do have throw rugs, attach them to the floor with carpet tape.  Make sure that you have a light switch at the top of the stairs and the bottom of the stairs. If you do not have them, ask someone to add them for you. What else can I do to help prevent falls?  Wear shoes that:  Do not have high heels.  Have rubber bottoms.  Are comfortable and fit you well.  Are closed at the toe. Do not wear sandals.  If you use a  stepladder:  Make sure that it is fully opened. Do not climb a closed stepladder.  Make sure that both sides of the stepladder are locked into place.  Ask someone to hold it for you, if possible.  Clearly mark and make sure that you can see:  Any grab bars or handrails.  First and last steps.  Where the edge of each step is.  Use tools that help you move around (mobility aids) if they are needed. These include:  Canes.  Walkers.  Scooters.  Crutches.  Turn on the lights when you go into a dark area. Replace any light bulbs as soon as they burn out.  Set up your furniture so you have a clear path. Avoid moving your furniture around.  If any of your floors are uneven, fix them.  If there are any pets around you, be aware of where they are.  Review your medicines with your doctor. Some medicines can make you feel dizzy. This can increase your chance of falling. Ask your doctor what other things that you can do to help prevent falls. This information is not intended to replace advice given to you by your health care provider. Make sure you discuss any questions you have with your health care provider. Document Released: 06/07/2009 Document Revised: 01/17/2016 Document Reviewed: 09/15/2014 Elsevier Interactive Patient Education  2017 [employer].

## 2020-05-29 NOTE — Patient Instructions (Signed)
Description   Today's changes: Take 5 mg daily.Recheck in 1-2 weeks.

## 2020-05-29 NOTE — Telephone Encounter (Signed)
Completed telephonic AWV today.

## 2020-06-13 ENCOUNTER — Ambulatory Visit (INDEPENDENT_AMBULATORY_CARE_PROVIDER_SITE_OTHER): Payer: Medicare HMO

## 2020-06-13 ENCOUNTER — Other Ambulatory Visit: Payer: Self-pay

## 2020-06-13 DIAGNOSIS — I480 Paroxysmal atrial fibrillation: Secondary | ICD-10-CM | POA: Diagnosis not present

## 2020-06-13 LAB — POCT INR
INR: 3.1 — AB (ref 2.0–3.0)
PT: 37.6

## 2020-06-13 NOTE — Patient Instructions (Signed)
Description   Today's changes: Take 5 mg daily except 2.5 mg on Wed.  Recheck in 2 weeks.

## 2020-06-27 ENCOUNTER — Other Ambulatory Visit: Payer: Self-pay

## 2020-06-27 ENCOUNTER — Ambulatory Visit (INDEPENDENT_AMBULATORY_CARE_PROVIDER_SITE_OTHER): Payer: Medicare HMO

## 2020-06-27 DIAGNOSIS — I480 Paroxysmal atrial fibrillation: Secondary | ICD-10-CM

## 2020-06-27 LAB — POCT INR
INR: 3.1 — AB (ref 2.0–3.0)
PT: 37

## 2020-06-27 NOTE — Patient Instructions (Signed)
Description   Today's changes: Take 5 mg daily except 2.5 mg on Wed.  Recheck in 2 weeks.

## 2020-06-28 DIAGNOSIS — H26491 Other secondary cataract, right eye: Secondary | ICD-10-CM | POA: Diagnosis not present

## 2020-07-10 ENCOUNTER — Ambulatory Visit: Payer: Medicare HMO | Admitting: Dermatology

## 2020-07-11 ENCOUNTER — Other Ambulatory Visit: Payer: Self-pay

## 2020-07-11 ENCOUNTER — Ambulatory Visit (INDEPENDENT_AMBULATORY_CARE_PROVIDER_SITE_OTHER): Payer: Medicare HMO

## 2020-07-11 DIAGNOSIS — I480 Paroxysmal atrial fibrillation: Secondary | ICD-10-CM

## 2020-07-11 LAB — POCT INR
INR: 2.9 (ref 2.0–3.0)
PT: 34.3

## 2020-07-11 NOTE — Patient Instructions (Signed)
Description   Take 5 mg daily except 2.5 mg on Wed.  Recheck in 3 weeks.

## 2020-07-24 ENCOUNTER — Other Ambulatory Visit: Payer: Self-pay

## 2020-07-24 ENCOUNTER — Ambulatory Visit: Payer: Medicare HMO | Admitting: Dermatology

## 2020-07-24 DIAGNOSIS — D485 Neoplasm of uncertain behavior of skin: Secondary | ICD-10-CM | POA: Diagnosis not present

## 2020-07-24 DIAGNOSIS — D1721 Benign lipomatous neoplasm of skin and subcutaneous tissue of right arm: Secondary | ICD-10-CM | POA: Diagnosis not present

## 2020-07-24 MED ORDER — AZITHROMYCIN 250 MG PO TABS: ORAL_TABLET | ORAL | 0 refills | Status: DC

## 2020-07-24 NOTE — Patient Instructions (Signed)

## 2020-07-24 NOTE — Progress Notes (Signed)
   Follow-Up Visit   Subjective  Casey Gallegos is a 85 y.o. male who presents for the following: lipoma (R ant deltoid - patient is here today for excision).  The following portions of the chart were reviewed this encounter and updated as appropriate:  Tobacco  Allergies  Meds  Problems  Med Hx  Surg Hx  Fam Hx     Review of Systems:  No other skin or systemic complaints except as noted in HPI or Assessment and Plan.  Objective  Well appearing patient in no apparent distress; mood and affect are within normal limits.  A focused examination was performed including the trunk and extremities. Relevant physical exam findings are noted in the Assessment and Plan.  Objective  Right anterior deltoid: 5.1 cm Rubbery nodule   Assessment & Plan  Neoplasm of uncertain behavior of skin -symptomatic growing lipoma Right anterior deltoid  Skin excision  Lesion length (cm):  5.1 Lesion width (cm):  5.1 Margin per side (cm):  0.1 Total excision diameter (cm):  5.3 Informed consent: discussed and consent obtained   Timeout: patient name, date of birth, surgical site, and procedure verified   Procedure prep:  Patient was prepped and draped in usual sterile fashion Prep type:  Isopropyl alcohol and povidone-iodine Anesthesia: the lesion was anesthetized in a standard fashion   Anesthesia comment:  8.0 cc Anesthetic:  1% lidocaine w/ epinephrine 1-100,000 buffered w/ 8.4% NaHCO3 Instrument used: #15 blade   Hemostasis achieved with: pressure   Outcome: patient tolerated procedure well with no complications    Skin repair Complexity:  Complex  Final length (cm):  3 Informed consent: discussed and consent obtained   Timeout: patient name, date of birth, surgical site, and procedure verified   Procedure prep:  Patient was prepped and draped in usual sterile fashion Prep type:  Povidone-iodine Anesthesia: the lesion was anesthetized in a standard fashion   Reason for type of repair:  reduce tension to allow closure, reduce the risk of dehiscence, infection, and necrosis, reduce subcutaneous dead space and avoid a hematoma, allow closure of the large defect, preserve normal anatomy, preserve normal anatomical and functional relationships and enhance both functionality and cosmetic results   Undermining comment:  Undermining defect 5.1cm Subcutaneous layers (deep stitches):  Suture size:  2-0 Suture type: Vicryl (polyglactin 910)   Fine/surface layer approximation (top stitches):  Suture size:  3-0 Suture type: nylon   Stitches: horizontal mattress   Suture removal (days):  7 Hemostasis achieved with: suture and pressure Outcome: patient tolerated procedure well with no complications   Post-procedure details: sterile dressing applied and wound care instructions given   Dressing type: bandage, pressure dressing and bacitracin    azithromycin (ZITHROMAX Z-PAK) 250 MG tablet  Specimen 1 - Surgical pathology Differential Diagnosis: D48.5 Lipoma vs other Check Margins: No Rubbery nodule 5.1cm  Start Mupirocin 2% ointment to aa's QD after dressing change (patient already has at home). Start Z-pak since patient is allergic to Doxycycline and Cefdinir. OK to restart anticoagulant in three days.  Return in about 1 week (around 07/31/2020).  Luther Redo, CMA, am acting as scribe for Sarina Ser, MD .  I, Casey Gallegos, RMA, am acting as scribe for Sarina Ser, MD .  Documentation: I have reviewed the above documentation for accuracy and completeness, and I agree with the above.  Sarina Ser, MD

## 2020-07-25 ENCOUNTER — Encounter: Payer: Self-pay | Admitting: Family Medicine

## 2020-07-25 ENCOUNTER — Ambulatory Visit (INDEPENDENT_AMBULATORY_CARE_PROVIDER_SITE_OTHER): Payer: Medicare HMO | Admitting: Family Medicine

## 2020-07-25 ENCOUNTER — Telehealth: Payer: Self-pay

## 2020-07-25 VITALS — BP 131/71 | HR 60 | Temp 98.1°F | Resp 18 | Ht 70.0 in | Wt 205.0 lb

## 2020-07-25 DIAGNOSIS — I10 Essential (primary) hypertension: Secondary | ICD-10-CM

## 2020-07-25 DIAGNOSIS — I480 Paroxysmal atrial fibrillation: Secondary | ICD-10-CM

## 2020-07-25 DIAGNOSIS — E782 Mixed hyperlipidemia: Secondary | ICD-10-CM | POA: Diagnosis not present

## 2020-07-25 DIAGNOSIS — Z0000 Encounter for general adult medical examination without abnormal findings: Secondary | ICD-10-CM | POA: Diagnosis not present

## 2020-07-25 DIAGNOSIS — N189 Chronic kidney disease, unspecified: Secondary | ICD-10-CM | POA: Diagnosis not present

## 2020-07-25 NOTE — Patient Instructions (Signed)
Take over-the-counter Glycolax and Peri colace daily.

## 2020-07-25 NOTE — Telephone Encounter (Signed)
Pt doing fine after yesterdays surgery./sh 

## 2020-07-25 NOTE — Progress Notes (Signed)
I,[name],acting as a scribe for Casey Durie, MD.,have documented all relevant documentation on the behalf of Casey Durie, MD,as directed by  Casey Durie, MD while in the presence of Casey Durie, MD.   Complete physical exam   [name]   DOB: December 05, 1934   85 y.o. Male  MRN: [id number] Visit Date: 07/25/2020  Today's healthcare provider: [name], MD   Chief Complaint  Patient presents with  . Annual Exam   Subjective    Casey Gallegos is a 85 y.o. male who presents today for a complete physical exam.  Patient works out daily.  He remains very active.  He is married and has a Casey Gallegos of 3 (1 daughter and 2 sons) and has 8 grandchildren. He reports consuming a general and low sodium diet. Home exercise routine includes walking. He generally feels well. He reports sleeping well. He does not have additional problems to discuss today.  He is feeling well. HPI  He has chronic back pain and left-sided neuropathy.  He takes an occasional oxycodone for this.  Much less frequently than in the past. Patient had AWV with [facility] 04/05/2020.  Past Medical History:  Diagnosis Date  . A-fib ([place])   . Anemia   . DVT (deep venous thrombosis) ([facility])    Monitor with PT/INR. Target 2.0 - 3.0   . Elevated PSA   . H/O degenerative disc disease   . Hypertension   . Lipoma of arm   . Lipomatosis   . Night sweats   . Obesity    Past Surgical History:  Procedure Laterality Date  . Bilateral Radical Keratotomy Bilateral 1997  . COLONOSCOPY WITH PROPOFOL N/A 01/11/2016   Procedure: COLONOSCOPY WITH PROPOFOL;  Surgeon: Manya Silvas, MD;  Location: [facility] ENDOSCOPY;  Service: Endoscopy;  Laterality: N/A; repeat 3 years, tubular adenoma  . deep vein thrombosis    . DENTAL SURGERY     Patient had implants  . GUM SURGERY    . L4 - S1 decompression Left    Left-sided  . PROSTATE BIOPSY    . TONSILLECTOMY AND ADENOIDECTOMY  age 34   Social History    Socioeconomic History  . Marital status: Married    Spouse name: Not on file  . Number of children: 3  . Years of education: Not on file  . Highest education level: Associate degree: occupational, Hotel manager, or vocational program  Occupational History  . Occupation: retired  Tobacco Use  . Smoking status: Former Smoker    Quit date: 08/26/1971    Years since quitting: 48.9  . Smokeless tobacco: Never Used  . Tobacco comment: Smoked for about 20 years.  Vaping Use  . Vaping Use: Never used  Substance and Sexual Activity  . Alcohol use: Not Currently  . Drug use: No  . Sexual activity: Not on file  Other Topics Concern  . Not on file  Social History Narrative  . Not on file   Social Determinants of Health   Financial Resource Strain: Low Risk   . Difficulty of Paying Living Expenses: Not hard at all  Food Insecurity: No Food Insecurity  . Worried About Charity fundraiser in the Last Year: Never true  . Ran Out of Food in the Last Year: Never true  Transportation Needs: No Transportation Needs  . Lack of Transportation (Medical): No  . Lack of Transportation (Non-Medical): No  Physical Activity: Sufficiently Active  . Days  of Exercise per Week: 6 days  . Minutes of Exercise per Session: 60 min  Stress: No Stress Concern Present  . Feeling of Stress : Not at all  Social Connections: Moderately Integrated  . Frequency of Communication with Friends and Family: More than three times a week  . Frequency of Social Gatherings with Friends and Family: More than three times a week  . Attends Religious Services: More than 4 times per year  . Active Member of Clubs or Organizations: No  . Attends Archivist Meetings: Never  . Marital Status: Married  Human resources officer Violence: Not At Risk  . Fear of Current or Ex-Partner: No  . Emotionally Abused: No  . Physically Abused: No  . Sexually Abused: No   Family Status  Relation Name Status  . Casey Gallegos  Deceased at age  22       died from cancer  . Casey Gallegos  Deceased at age 53       with CAD and MI  . Casey Gallegos  Alive  . Casey Gallegos  Alive  . Neg Hx  (Not Specified)   Family History  Problem Relation Age of Onset  . Cancer Casey Gallegos        secondary to cancinoma of the jaw from her dipping snuff.  . Heart attack Casey Gallegos   . CAD Casey Gallegos   . Hypertension Casey Gallegos   . Diabetes Casey Gallegos        Type I diabetes  . Prostate cancer Neg Hx   . Kidney cancer Neg Hx    Allergies  Allergen Reactions  . Cefdinir Nausea Only    hypersensitivity to smell  . Doxycycline     Sun sensitivity  . Prednisone   . Sertraline Other (See Comments)    Hallucinations     Patient Care Team: Jerrol Banana., MD as PCP - General (Family Medicine) Hollice Espy, MD as Consulting Physician (Urology) Corey Skains, MD as Consulting Physician (Cardiology) Leandrew Koyanagi, MD as Referring Physician (Ophthalmology) Ralene Bathe, MD (Dermatology)   Medications: Outpatient Medications Prior to Visit  Medication Sig  . amLODipine (NORVASC) 5 MG tablet TAKE 1 TABLET EVERY DAY  . azithromycin (ZITHROMAX Z-PAK) 250 MG tablet Take as directed  . benazepril (LOTENSIN) 40 MG tablet TAKE 1 TABLET EVERY DAY  . Carboxymethylcellulose Sodium (LUBRICANT EYE DROPS OP) as needed.   . carvedilol (COREG) 6.25 MG tablet TAKE 1 TABLET TWICE DAILY  . CORTIZONE-10 DIABETICS SKIN 1 % lotion Apply 1 application topically as needed.   . doxazosin (CARDURA) 8 MG tablet TAKE 1 TABLET EVERY DAY  . finasteride (PROSCAR) 5 MG tablet TAKE 1 TABLET EVERY DAY  . gabapentin (NEURONTIN) 300 MG capsule TAKE 1 CAPSULE EVERY MORNING, TAKE 1 IN THE AFTERNOON AND TAKE 2 CAPSULES AT BED TIME  . Magnesium 500 MG TABS Take by mouth daily.   . Multiple Vitamin (MULTI-VITAMIN) tablet Take 1 tablet by mouth daily.   . MULTIPLE VITAMIN PO Take by mouth daily.   . mupirocin ointment (BACTROBAN) 2 % Apply 1 application topically daily. Qd to excision site on right  arm  . oxyCODONE (OXY IR/ROXICODONE) 5 MG immediate release tablet Take 1 tablet (5 mg total) by mouth every 4 (four) hours as needed for severe pain.  Marland Kitchen triamterene-hydrochlorothiazide (MAXZIDE-25) 37.5-25 MG tablet TAKE 1/2 TABLET EVERY DAY  . warfarin (COUMADIN) 1 MG tablet Take by mouth.   . warfarin (COUMADIN) 4 MG tablet TAKE 1 TABLET EVERY OTHER DAY AT  6PM ALTERNATING WITH 5MG   . warfarin (COUMADIN) 5 MG tablet TAKE 1 TABLET EVERY DAY  AT  6PM   No facility-administered medications prior to visit.    Review of Systems  Cardiovascular: Positive for leg swelling.  Gastrointestinal: Positive for constipation.  Musculoskeletal: Positive for back pain.  All other systems reviewed and are negative.   Last hemoglobin A1c Lab Results  Component Value Date   HGBA1C 5.7 (H) 09/08/2017      Objective    BP 131/71 (BP Location: Left Arm, Patient Position: Sitting, Cuff Size: Large)   Pulse 60   Temp 98.1 F (36.7 C) (Oral)   Resp 18   Ht 5\' 10"  (1.778 m)   Wt 205 lb (93 kg)   SpO2 99%   BMI 29.41 kg/m  BP Readings from Last 3 Encounters:  07/25/20 131/71  04/04/20 122/63  01/03/20 121/73   Wt Readings from Last 3 Encounters:  07/25/20 205 lb (93 kg)  04/04/20 199 lb (90.3 kg)  01/03/20 202 lb 12.8 oz (92 kg)      Physical Exam Vitals reviewed.  Constitutional:      Appearance: He is well-developed.  HENT:     Head: Normocephalic and atraumatic.     Right Ear: External ear normal.     Left Ear: External ear normal.     Nose: Nose normal.  Eyes:     General: No scleral icterus.    Conjunctiva/sclera: Conjunctivae normal.  Neck:     Thyroid: No thyromegaly.  Cardiovascular:     Rate and Rhythm: Normal rate and regular rhythm.     Heart sounds: Normal heart sounds.  Pulmonary:     Effort: Pulmonary effort is normal.     Breath sounds: Normal breath sounds.  Abdominal:     Palpations: Abdomen is soft.  Skin:    General: Skin is warm and dry.     Comments:  He has multiple small lipomas.  One that was painful was removed from his right upper arm yesterday.  Neurological:     Mental Status: He is alert and oriented to person, place, and time. Mental status is at baseline.  Psychiatric:        Mood and Affect: Mood normal.        Behavior: Behavior normal.        Thought Content: Thought content normal.        Judgment: Judgment normal.       Last depression screening scores PHQ 2/9 Scores 05/29/2020 03/16/2019 03/10/2018  PHQ - 2 Score 0 0 2  PHQ- 9 Score - - 5   Last fall risk screening Fall Risk  05/29/2020  Falls in the past year? 0  Number falls in past yr: 0  Injury with Fall? 0   Last Audit-C alcohol use screening Alcohol Use Disorder Test (AUDIT) 05/29/2020  1. How often do you have a drink containing alcohol? 0  2. How many drinks containing alcohol do you have on a typical day when you are drinking? 0  3. How often do you have six or more drinks on one occasion? 0  AUDIT-C Score 0  Alcohol Brief Interventions/Follow-up AUDIT Score <7 follow-up not indicated   A score of 3 or more in women, and 4 or more in men indicates increased risk for alcohol abuse, EXCEPT if all of the points are from question 1   No results found for any visits on 07/25/20.  Assessment & Plan    Routine  Health Maintenance and Physical Exam  Exercise Activities and Dietary recommendations Goals    . DIET - INCREASE WATER INTAKE     Recommend increasing water intake to 6-8 glasses a day.        Immunization History  Administered Date(s) Administered  . Fluad Quad(high Dose 65+) 05/10/2019  . Influenza, High Dose Seasonal PF 05/11/2015, 05/21/2016, 05/27/2017, 05/26/2018, 05/01/2020  . PFIZER SARS-COV-2 Vaccination 09/09/2019, 09/30/2019, 05/25/2020  . Pneumococcal Conjugate-13 01/09/2014  . Pneumococcal Polysaccharide-23 08/08/2004  . Td 10/25/2008    Health Maintenance  Topic Date Due  . TETANUS/TDAP  10/26/2018  . INFLUENZA VACCINE   Completed  . COVID-19 Vaccine  Completed  . PNA vac Low Risk Adult  Completed    Discussed health benefits of physical activity, and encouraged him to engage in regular exercise appropriate for his age and condition.  1. Annual physical exam  - TSH - CBC w/Diff/Platelet - Comprehensive Metabolic Panel (CMET)  2. Essential hypertension  - TSH - CBC w/Diff/Platelet - Comprehensive Metabolic Panel (CMET)  3. AF (paroxysmal atrial fibrillation) (HCC)  - TSH - CBC w/Diff/Platelet - Comprehensive Metabolic Panel (CMET)  4. Combined fat and carbohydrate induced hyperlipemia  - TSH - CBC w/Diff/Platelet - Comprehensive Metabolic Panel (CMET)  5. Chronic kidney disease, unspecified CKD stage  - TSH - CBC w/Diff/Platelet - Comprehensive Metabolic Panel (CMET)   Return in about 6 months (around 01/23/2021).        Farhiya Rosten Cranford Mon, MD  [facility] [phone] (phone) [phone] (fax)  [place]

## 2020-07-26 ENCOUNTER — Encounter: Payer: Self-pay | Admitting: Dermatology

## 2020-07-26 LAB — CBC WITH DIFFERENTIAL/PLATELET
Basophils Absolute: 0 10*3/uL (ref 0.0–0.2)
Basos: 1 %
EOS (ABSOLUTE): 0.2 10*3/uL (ref 0.0–0.4)
Eos: 3 %
Hematocrit: 38.1 % (ref 37.5–51.0)
Hemoglobin: 13.3 g/dL (ref 13.0–17.7)
Immature Grans (Abs): 0 10*3/uL (ref 0.0–0.1)
Immature Granulocytes: 1 %
Lymphocytes Absolute: 1.2 10*3/uL (ref 0.7–3.1)
Lymphs: 19 %
MCH: 31.9 pg (ref 26.6–33.0)
MCHC: 34.9 g/dL (ref 31.5–35.7)
MCV: 91 fL (ref 79–97)
Monocytes Absolute: 0.7 10*3/uL (ref 0.1–0.9)
Monocytes: 11 %
Neutrophils Absolute: 4.2 10*3/uL (ref 1.4–7.0)
Neutrophils: 65 %
Platelets: 168 10*3/uL (ref 150–450)
RBC: 4.17 x10E6/uL (ref 4.14–5.80)
RDW: 13.5 % (ref 11.6–15.4)
WBC: 6.4 10*3/uL (ref 3.4–10.8)

## 2020-07-26 LAB — COMPREHENSIVE METABOLIC PANEL
ALT: 15 IU/L (ref 0–44)
AST: 19 IU/L (ref 0–40)
Albumin/Globulin Ratio: 1.6 (ref 1.2–2.2)
Albumin: 4 g/dL (ref 3.6–4.6)
Alkaline Phosphatase: 88 IU/L (ref 44–121)
BUN/Creatinine Ratio: 19 (ref 10–24)
BUN: 21 mg/dL (ref 8–27)
Bilirubin Total: 1.4 mg/dL — ABNORMAL HIGH (ref 0.0–1.2)
CO2: 24 mmol/L (ref 20–29)
Calcium: 9 mg/dL (ref 8.6–10.2)
Chloride: 107 mmol/L — ABNORMAL HIGH (ref 96–106)
Creatinine, Ser: 1.1 mg/dL (ref 0.76–1.27)
GFR calc Af Amer: 71 mL/min/{1.73_m2} (ref >59)
GFR calc non Af Amer: 62 mL/min/{1.73_m2} (ref >59)
Globulin, Total: 2.5 g/dL (ref 1.5–4.5)
Glucose: 72 mg/dL (ref 65–99)
Potassium: 4.5 mmol/L (ref 3.5–5.2)
Sodium: 141 mmol/L (ref 134–144)
Total Protein: 6.5 g/dL (ref 6.0–8.5)

## 2020-07-26 LAB — TSH: TSH: 2.33 u[IU]/mL (ref 0.450–4.500)

## 2020-07-27 DIAGNOSIS — I480 Paroxysmal atrial fibrillation: Secondary | ICD-10-CM | POA: Diagnosis not present

## 2020-07-27 DIAGNOSIS — I420 Dilated cardiomyopathy: Secondary | ICD-10-CM | POA: Diagnosis not present

## 2020-07-27 DIAGNOSIS — I495 Sick sinus syndrome: Secondary | ICD-10-CM | POA: Diagnosis not present

## 2020-07-27 DIAGNOSIS — E782 Mixed hyperlipidemia: Secondary | ICD-10-CM | POA: Diagnosis not present

## 2020-07-27 DIAGNOSIS — I82409 Acute embolism and thrombosis of unspecified deep veins of unspecified lower extremity: Secondary | ICD-10-CM | POA: Diagnosis not present

## 2020-07-27 DIAGNOSIS — I482 Chronic atrial fibrillation, unspecified: Secondary | ICD-10-CM | POA: Diagnosis not present

## 2020-07-27 DIAGNOSIS — I071 Rheumatic tricuspid insufficiency: Secondary | ICD-10-CM | POA: Diagnosis not present

## 2020-07-27 DIAGNOSIS — I10 Essential (primary) hypertension: Secondary | ICD-10-CM | POA: Diagnosis not present

## 2020-07-31 ENCOUNTER — Other Ambulatory Visit: Payer: Self-pay

## 2020-07-31 ENCOUNTER — Ambulatory Visit (INDEPENDENT_AMBULATORY_CARE_PROVIDER_SITE_OTHER): Payer: Medicare HMO | Admitting: Dermatology

## 2020-07-31 DIAGNOSIS — L720 Epidermal cyst: Secondary | ICD-10-CM

## 2020-07-31 DIAGNOSIS — D1721 Benign lipomatous neoplasm of skin and subcutaneous tissue of right arm: Secondary | ICD-10-CM

## 2020-07-31 NOTE — Progress Notes (Signed)
   Follow-Up Visit   Subjective  Casey Gallegos is a 85 y.o. male who presents for the following: Follow-up (Post op of right ant deltoid - Lipoma).  The following portions of the chart were reviewed this encounter and updated as appropriate:  Tobacco  Allergies  Meds  Problems  Med Hx  Surg Hx  Fam Hx      Objective  Well appearing patient in no apparent distress; mood and affect are within normal limits.  A focused examination was performed including right anterior deltoid. Relevant physical exam findings are noted in the Assessment and Plan.  Objective  Right ant deltoid: Well healed   Objective  Neck - Posterior: Subcutaneous nodule.   Assessment & Plan  Lipoma of right upper extremity Right ant deltoid  Biopsy proven Lipoma discussed  Encounter for Removal of Sutures - Incision site at the R ant deltoid  is clean, dry and intact - Wound cleansed, sutures removed, wound cleansed and steri strips applied.  - Discussed pathology results showing Lipoma  - Patient advised to keep steri-strips dry until they fall off. - Scars remodel for a full year. - Once steri-strips fall off, patient can apply over-the-counter silicone scar cream each night to help with scar remodeling if desired. - Patient advised to call with any concerns or if they notice any new or changing lesions.   Epidermal inclusion cyst Neck - Posterior  Plan on surgery removal  Return for return for cyst surgery .  IMarye Round, CMA, am acting as scribe for Sarina Ser, MD .  Documentation: I have reviewed the above documentation for accuracy and completeness, and I agree with the above.  Sarina Ser, MD

## 2020-08-01 ENCOUNTER — Ambulatory Visit (INDEPENDENT_AMBULATORY_CARE_PROVIDER_SITE_OTHER): Payer: Medicare HMO

## 2020-08-01 DIAGNOSIS — I480 Paroxysmal atrial fibrillation: Secondary | ICD-10-CM | POA: Diagnosis not present

## 2020-08-01 LAB — POCT INR
INR: 1.6 — AB (ref 2.0–3.0)
PT: 18.9

## 2020-08-01 NOTE — Patient Instructions (Signed)
Description   Take 5 mg today and then resume taking 5 mg daily except 2.5 mg on Wed.  Recheck in 3 weeks.

## 2020-08-02 ENCOUNTER — Telehealth: Payer: Self-pay

## 2020-08-02 NOTE — Telephone Encounter (Signed)
-----   Message from Jerrol Banana., MD sent at 08/02/2020  7:56 AM EST ----- If he is alternating 4 and 5 mg of Coumadin daily would go to 5 mg daily except for 4 mg on Tuesday and Saturday.  Repeat PT/INR in 2 weeks

## 2020-08-02 NOTE — Telephone Encounter (Signed)
Written by Jerrol Banana., MD on 08/02/2020 7:56 AM EST Seen by patient [name] on 08/02/2020 8:46 AM

## 2020-08-06 ENCOUNTER — Encounter: Payer: Self-pay | Admitting: Dermatology

## 2020-08-22 ENCOUNTER — Ambulatory Visit (INDEPENDENT_AMBULATORY_CARE_PROVIDER_SITE_OTHER): Payer: Medicare HMO

## 2020-08-22 ENCOUNTER — Other Ambulatory Visit: Payer: Self-pay

## 2020-08-22 DIAGNOSIS — M5136 Other intervertebral disc degeneration, lumbar region: Secondary | ICD-10-CM

## 2020-08-22 DIAGNOSIS — I480 Paroxysmal atrial fibrillation: Secondary | ICD-10-CM | POA: Diagnosis not present

## 2020-08-22 LAB — POCT INR
INR: 2.3 (ref 2.0–3.0)
PT: 28.1

## 2020-08-22 MED ORDER — OXYCODONE HCL 5 MG PO TABS: 5.0000 mg | ORAL_TABLET | ORAL | 0 refills | Status: DC | PRN

## 2020-08-22 NOTE — Patient Instructions (Signed)
Description   Take 5 mg daily except 2.5 mg on Wed.  Recheck in 4 weeks.

## 2020-08-30 DIAGNOSIS — I420 Dilated cardiomyopathy: Secondary | ICD-10-CM | POA: Diagnosis not present

## 2020-08-30 DIAGNOSIS — I071 Rheumatic tricuspid insufficiency: Secondary | ICD-10-CM | POA: Diagnosis not present

## 2020-08-30 DIAGNOSIS — I482 Chronic atrial fibrillation, unspecified: Secondary | ICD-10-CM | POA: Diagnosis not present

## 2020-08-31 DIAGNOSIS — E782 Mixed hyperlipidemia: Secondary | ICD-10-CM | POA: Diagnosis not present

## 2020-08-31 DIAGNOSIS — R9431 Abnormal electrocardiogram [ECG] [EKG]: Secondary | ICD-10-CM | POA: Diagnosis not present

## 2020-08-31 DIAGNOSIS — I4821 Permanent atrial fibrillation: Secondary | ICD-10-CM | POA: Diagnosis not present

## 2020-08-31 DIAGNOSIS — I420 Dilated cardiomyopathy: Secondary | ICD-10-CM | POA: Diagnosis not present

## 2020-08-31 DIAGNOSIS — I482 Chronic atrial fibrillation, unspecified: Secondary | ICD-10-CM | POA: Diagnosis not present

## 2020-08-31 DIAGNOSIS — I10 Essential (primary) hypertension: Secondary | ICD-10-CM | POA: Diagnosis not present

## 2020-09-04 NOTE — Progress Notes (Signed)
09/05/2020 12:06 PM   Casey Gallegos [birth year] [id number]  Referring provider: Jerrol Gallegos., MD [address] [place],  Casey Gallegos [id number]  Chief Complaint  Patient presents with  . Benign Prostatic Hypertrophy   Urological history 1. High risk hematuria - Former smoker - Incidental microscopic hematuria visit 09/1016 (3-10 RBC) with associated dysuria which has resolved.  Urine cytology 09/1016 negative - declined work up - UA today negative for micro heme  2. Elevated PSA - negative prostate biopsy in 05/2011 by Casey Gallegos - PSA trend  12.1 in 2015  5.5 in 2016  3.7 in 2021  3. BPH with LU TS - I PSS: 8/2 - taking doxazosin 8 mg and finasteride 5 mg daily  4. Incomplete bladder emptying - PVR's ~100 mL   HPI: Casey Gallegos is an 87 year old male who presents today for a year follow up.  He is mostly satisfied with his urinary symptoms except for the occasional nocturia x 1.  Patient denies any modifying or aggravating factors.  Patient denies any gross hematuria, dysuria or suprapubic/flank pain.  Patient denies any fevers, chills, nausea or vomiting.   He has had no episodes of frank urinary retention, straining to urinate or weak urinary stream.  He also has had not had any issues with urinary tract infections.  His UA is negative for micro heme.   PVR is 195 mL.     IPSS    Row Name 09/05/20 1000         International Prostate Symptom Score   How often have you had the sensation of not emptying your bladder? Less than 1 in 5     How often have you had to urinate less than every two hours? Less than half the time     How often have you found you stopped and started again several times when you urinated? Less than 1 in 5 times     How often have you found it difficult to postpone urination? Less than 1 in 5 times     How often have you had a weak urinary stream? Less than 1 in 5 times     How often have you had to strain to start urination? Less  than 1 in 5 times     How many times did you typically get up at night to urinate? 1 Time     Total IPSS Score 8           Quality of Life due to urinary symptoms   If you were to spend the rest of your life with your urinary condition just the way it is now how would you feel about that? Mostly Satisfied            Score:  1-7 Mild 8-19 Moderate 20-35 Severe    PMH: Past Medical History:  Diagnosis Date  . A-fib (Casey Gallegos)   . Anemia   . DVT (deep venous thrombosis) ([facility])    Monitor with PT/INR. Target 2.0 - 3.0   . Elevated PSA   . H/O degenerative disc disease   . Hypertension   . Lipoma of arm   . Lipomatosis   . Night sweats   . Obesity     Surgical History: Past Surgical History:  Procedure Laterality Date  . Bilateral Radical Keratotomy Bilateral 1997  . COLONOSCOPY WITH PROPOFOL N/A 01/11/2016   Procedure: COLONOSCOPY WITH PROPOFOL;  Surgeon: Casey Silvas, MD;  Location: [facility] ENDOSCOPY;  Service: Endoscopy;  Laterality: N/A; repeat 3 years, tubular adenoma  . deep vein thrombosis    . DENTAL SURGERY     Patient had implants  . GUM SURGERY    . L4 - S1 decompression Left    Left-sided  . PROSTATE BIOPSY    . TONSILLECTOMY AND ADENOIDECTOMY  age 17    Home Medications:  Allergies as of 09/05/2020      Reactions   Cefdinir Nausea Only   hypersensitivity to smell   Doxycycline    Sun sensitivity   Prednisone    Sertraline Other (See Comments)   Hallucinations       Medication List       Accurate as of September 05, 2020 11:59 PM. If you have any questions, ask your nurse or doctor.        STOP taking these medications   azithromycin 250 MG tablet Commonly known as: Zithromax Z-Pak Stopped by: Casey Smigelski, PA-C   LUBRICANT EYE DROPS OP Stopped by: Casey Council, PA-C     TAKE these medications   amLODipine 5 MG tablet Commonly known as: NORVASC TAKE 1 TABLET EVERY DAY   benazepril 40 MG tablet Commonly known as: LOTENSIN TAKE 1  TABLET EVERY DAY   carvedilol 6.25 MG tablet Commonly known as: COREG TAKE 1 TABLET TWICE DAILY   Cortizone-10 Diabetics Skin 1 % lotion Generic drug: hydrocortisone Apply 1 application topically as needed.   doxazosin 8 MG tablet Commonly known as: CARDURA TAKE 1 TABLET EVERY DAY   finasteride 5 MG tablet Commonly known as: PROSCAR TAKE 1 TABLET EVERY DAY   gabapentin 300 MG capsule Commonly known as: NEURONTIN TAKE 1 CAPSULE EVERY MORNING, TAKE 1 IN THE AFTERNOON AND TAKE 2 CAPSULES AT BED TIME   Magnesium 500 MG Tabs Take by mouth daily.   MULTIPLE VITAMIN PO Take by mouth daily.   Multi-Vitamin tablet Take 1 tablet by mouth daily.   oxyCODONE 5 MG immediate release tablet Commonly known as: Oxy IR/ROXICODONE Take 1 tablet (5 mg total) by mouth every 4 (four) hours as needed for severe pain.   triamterene-hydrochlorothiazide 37.5-25 MG tablet Commonly known as: MAXZIDE-25 TAKE 1/2 TABLET EVERY DAY   warfarin 1 MG tablet Commonly known as: COUMADIN Take as directed by the anticoagulation clinic. If you are unsure how to take this medication, talk to your nurse or doctor. Original instructions: Take by mouth.   warfarin 4 MG tablet Commonly known as: COUMADIN Take as directed by the anticoagulation clinic. If you are unsure how to take this medication, talk to your nurse or doctor. Original instructions: TAKE 1 TABLET EVERY OTHER DAY AT 6PM ALTERNATING WITH 5MG    warfarin 5 MG tablet Commonly known as: COUMADIN Take as directed by the anticoagulation clinic. If you are unsure how to take this medication, talk to your nurse or doctor. Original instructions: TAKE 1 TABLET EVERY DAY  AT  6PM       Allergies:  Allergies  Allergen Reactions  . Cefdinir Nausea Only    hypersensitivity to smell  . Doxycycline     Sun sensitivity  . Prednisone   . Sertraline Other (See Comments)    Hallucinations     Family History: Family History  Problem Relation Age  of Onset  . Cancer Mother        secondary to cancinoma of the jaw from her dipping snuff.  . Heart attack Father   . CAD Father   . Hypertension Sister   .  Diabetes Son        Type I diabetes  . Prostate cancer Neg Hx   . Kidney cancer Neg Hx     Social History:  reports that he quit smoking about 49 years ago. He has never used smokeless tobacco. He reports previous alcohol use. He reports that he does not use drugs.  Pertinent ROS in HPI  Physical Exam: BP 140/74   Pulse (!) 56   Temp 98 F (36.7 C)   Ht 5\' 10"  (1.778 m)   Wt 199 lb (90.3 kg)   BMI 28.55 kg/m   Constitutional:  Well nourished. Alert and oriented, No acute distress. HEENT:  AT, mask in place.  Trachea midline Cardiovascular: No clubbing, cyanosis, or edema. Respiratory: Normal respiratory effort, no increased work of breathing. Neurologic: Grossly intact, no focal deficits, moving all 4 extremities. Psychiatric: Normal mood and affect.  Laboratory Data: Lab Results  Component Value Date   WBC 6.4 07/25/2020   HGB 13.3 07/25/2020   HCT 38.1 07/25/2020   MCV 91 07/25/2020   PLT 168 07/25/2020    Lab Results  Component Value Date   CREATININE 1.10 07/25/2020   PSA as above  Urinalysis Component     Latest Ref Rng & Units 09/05/2020  Specific Gravity, UA     1.005 - 1.030 1.020  pH, UA     5.0 - 7.5 7.0  Color, UA     Yellow Yellow  Appearance Ur     Clear Clear  Leukocytes,UA     Negative Negative  Protein,UA     Negative/Trace Negative  Glucose, UA     Negative Negative  Ketones, UA     Negative Negative  RBC, UA     Negative Negative  Bilirubin, UA     Negative Negative  Urobilinogen, Ur     0.2 - 1.0 mg/dL 0.2  Nitrite, UA     Negative Negative  Microscopic Examination      See below:   Component     Latest Ref Rng & Units 09/05/2020  WBC, UA     0 - 5 /hpf None seen  RBC     0 - 2 /hpf 0-2  Epithelial Cells (non renal)     0 - 10 /hpf None seen  Crystals      N/A Present (A)  Crystal Type     N/A Amorphous Sediment  Bacteria, UA     None seen/Few None seen  I have reviewed the labs.  Pertinent Imaging Results for CROSLEY, STEJSKAL (MRN [id number]) as of 09/05/2020 10:46  Ref. Range 09/05/2020 10:31  Scan Result Unknown 195    Assessment & Plan:    1. BPH with obstruction/lower urinary tract symptoms Symptoms well controlled on finasteride/doxazosin Patient will continue these medications indefinitely He is not interested in any surgical procedure at this time Bladder scan completed Return in 1 year for IPSS and PVR  2. Incomplete bladder emptying His PVRs have been borderline elevated, so we will continue to follow annually He has had no issues with urinary retention, infections or other warning symptoms Urinary retention precautions reviewed  3. History of hematuria No complaints of gross hematuria UA is negative for micro heme Return in 1 year for UA and patient to report any episodes of gross hematuria in the interim  Return in about 1 year (around 09/05/2021) for IPSS, PVR and UA.  Casey Gallegos, [place] [address] [place]  [place], [place] [address] [phone]

## 2020-09-05 ENCOUNTER — Encounter: Payer: Self-pay | Admitting: Urology

## 2020-09-05 ENCOUNTER — Ambulatory Visit: Payer: Medicare HMO | Admitting: Urology

## 2020-09-05 ENCOUNTER — Other Ambulatory Visit: Payer: Self-pay

## 2020-09-05 VITALS — BP 140/74 | HR 56 | Temp 98.0°F | Ht 70.0 in | Wt 199.0 lb

## 2020-09-05 DIAGNOSIS — R339 Retention of urine, unspecified: Secondary | ICD-10-CM

## 2020-09-05 DIAGNOSIS — R319 Hematuria, unspecified: Secondary | ICD-10-CM

## 2020-09-05 DIAGNOSIS — N138 Other obstructive and reflux uropathy: Secondary | ICD-10-CM

## 2020-09-05 DIAGNOSIS — N401 Enlarged prostate with lower urinary tract symptoms: Secondary | ICD-10-CM | POA: Diagnosis not present

## 2020-09-05 LAB — BLADDER SCAN AMB NON-IMAGING: Scan Result: 195

## 2020-09-06 LAB — URINALYSIS, COMPLETE
Bilirubin, UA: NEGATIVE
Glucose, UA: NEGATIVE
Ketones, UA: NEGATIVE
Leukocytes,UA: NEGATIVE
Nitrite, UA: NEGATIVE
Protein,UA: NEGATIVE
RBC, UA: NEGATIVE
Specific Gravity, UA: 1.02 (ref 1.005–1.030)
Urobilinogen, Ur: 0.2 mg/dL (ref 0.2–1.0)
pH, UA: 7 (ref 5.0–7.5)

## 2020-09-06 LAB — MICROSCOPIC EXAMINATION
Bacteria, UA: NONE SEEN
Epithelial Cells (non renal): NONE SEEN /hpf (ref 0–10)
WBC, UA: NONE SEEN /hpf (ref 0–5)

## 2020-09-19 ENCOUNTER — Other Ambulatory Visit: Payer: Self-pay

## 2020-09-19 ENCOUNTER — Ambulatory Visit (INDEPENDENT_AMBULATORY_CARE_PROVIDER_SITE_OTHER): Payer: Medicare HMO

## 2020-09-19 DIAGNOSIS — I480 Paroxysmal atrial fibrillation: Secondary | ICD-10-CM | POA: Diagnosis not present

## 2020-09-19 LAB — POCT INR
INR: 2.4 (ref 2.0–3.0)
PT: 28.6

## 2020-09-19 NOTE — Patient Instructions (Signed)
Description   Take 5 mg daily except 2.5 mg on Wed.  Recheck in 4 weeks.

## 2020-09-28 ENCOUNTER — Other Ambulatory Visit: Payer: Self-pay | Admitting: Family Medicine

## 2020-09-28 NOTE — Telephone Encounter (Signed)
Requested medication (s) are due for refill today: yes  Requested medication (s) are on the active medication list:  yes  Last refill:  08/14/2020  Future visit scheduled: yes  Notes to clinic:  this refill cannot be delegated    Requested Prescriptions  Pending Prescriptions Disp Refills   warfarin (COUMADIN) 5 MG tablet [Pharmacy Med Name: WARFARIN SODIUM 5 MG Tablet] 90 tablet 1    Sig: TAKE 1 TABLET EVERY DAY  AT  [place]      Hematology:  Anticoagulants - warfarin Failed - 09/28/2020 11:35 AM      Failed - This refill cannot be delegated      Failed - If the patient is managed by Coumadin Clinic - route to their Pool. If not, forward to the provider.      Failed - Valid encounter within last 3 months    Recent Outpatient Visits           2 months ago Annual physical exam   [facility] Jerrol Banana., MD   5 months ago AF (paroxysmal atrial fibrillation) [facility])   [facility] Jerrol Banana., MD   8 months ago AF (paroxysmal atrial fibrillation) [facility])   [facility] Jerrol Banana., MD   1 year ago Annual physical exam   [facility] Jerrol Banana., MD   1 year ago Essential hypertension   [facility] Jerrol Banana., MD       Future Appointments             In 2 months Jerrol Banana., MD [facility]   In 11 months McGowan, Gordan Payment [facility]             Passed - INR in normal range and within 30 days    INR  Date Value Ref Range Status  09/19/2020 2.4 2.0 - 3.0 Final  07/08/2017 1.8 (H)  Final    Comment:    Reference Range                     0.9-1.1 Moderate-intensity Warfarin Therapy 2.0-3.0 Higher-intensity Warfarin Therapy   3.0-4.0  .            Signed Prescriptions Disp Refills   benazepril (LOTENSIN) 40 MG tablet 90 tablet 1    Sig: TAKE 1 TABLET EVERY DAY      Cardiovascular:   ACE Inhibitors Failed - 09/28/2020 11:35 AM      Failed - Last BP in normal range    BP Readings from Last 1 Encounters:  09/05/20 140/74          Passed - Cr in normal range and within 180 days    Creatinine, Ser  Date Value Ref Range Status  07/25/2020 1.10 0.76 - 1.27 mg/dL Final          Passed - K in normal range and within 180 days    Potassium  Date Value Ref Range Status  07/25/2020 4.5 3.5 - 5.2 mmol/L Final          Passed - Patient is not pregnant      Passed - Valid encounter within last 6 months    Recent Outpatient Visits           2 months ago Annual physical exam   [facility] Jerrol Banana., MD   5 months ago AF (  paroxysmal atrial fibrillation) [facility])   [facility] Jerrol Banana., MD   8 months ago AF (paroxysmal atrial fibrillation) [facility])   [facility] Jerrol Banana., MD   1 year ago Annual physical exam   [facility] Jerrol Banana., MD   1 year ago Essential hypertension   [facility] Jerrol Banana., MD       Future Appointments             In 2 months Jerrol Banana., MD [facility] [place]   In 11 months McGowan, Gordan Payment [facility]

## 2020-10-09 ENCOUNTER — Other Ambulatory Visit: Payer: Self-pay

## 2020-10-09 ENCOUNTER — Ambulatory Visit: Payer: Medicare HMO | Admitting: Dermatology

## 2020-10-09 ENCOUNTER — Encounter: Payer: Self-pay | Admitting: Dermatology

## 2020-10-09 ENCOUNTER — Other Ambulatory Visit: Payer: Self-pay | Admitting: Family Medicine

## 2020-10-09 ENCOUNTER — Other Ambulatory Visit: Payer: Self-pay | Admitting: Dermatology

## 2020-10-09 ENCOUNTER — Telehealth: Payer: Self-pay

## 2020-10-09 DIAGNOSIS — L720 Epidermal cyst: Secondary | ICD-10-CM

## 2020-10-09 DIAGNOSIS — L578 Other skin changes due to chronic exposure to nonionizing radiation: Secondary | ICD-10-CM | POA: Diagnosis not present

## 2020-10-09 DIAGNOSIS — L570 Actinic keratosis: Secondary | ICD-10-CM

## 2020-10-09 DIAGNOSIS — D492 Neoplasm of unspecified behavior of bone, soft tissue, and skin: Secondary | ICD-10-CM

## 2020-10-09 NOTE — Telephone Encounter (Signed)
Lft pt message to call if any problems after today's surgery.Casey Gallegos

## 2020-10-09 NOTE — Patient Instructions (Signed)

## 2020-10-09 NOTE — Progress Notes (Signed)
Follow-Up Visit   Subjective  Casey Gallegos is a 86 y.o. male who presents for the following: Cyst (Posterior neck, pt presents for excison).  The following portions of the chart were reviewed this encounter and updated as appropriate:   Tobacco  Allergies  Meds  Problems  Med Hx  Surg Hx  Fam Hx     Review of Systems:  No other skin or systemic complaints except as noted in HPI or Assessment and Plan.  Objective  Well appearing patient in no apparent distress; mood and affect are within normal limits.  A focused examination was performed including posterior neck. Relevant physical exam findings are noted in the Assessment and Plan.  Objective  Posterior neck: Cystic pap 1.2cm  Objective  L post neck: Pink macule  Assessment & Plan  Neoplasm of skin Posterior neck  Skin excision  Lesion length (cm):  1.2 Lesion width (cm):  1.2 Margin per side (cm):  0 Total excision diameter (cm):  1.2 Informed consent: discussed and consent obtained   Timeout: patient name, date of birth, surgical site, and procedure verified   Procedure prep:  Patient was prepped and draped in usual sterile fashion Prep type:  Isopropyl alcohol and povidone-iodine Anesthesia: the lesion was anesthetized in a standard fashion   Anesthetic:  1% lidocaine w/ epinephrine 1-100,000 buffered w/ 8.4% NaHCO3 (13cc) Instrument used comment:  15c blade Hemostasis achieved with: pressure   Hemostasis achieved with comment:  Electrocautery Outcome: patient tolerated procedure well with no complications   Post-procedure details: sterile dressing applied and wound care instructions given   Dressing type: bandage and pressure dressing (Mupirocin)    Skin repair Complexity:  Complex Final length (cm):  3 Reason for type of repair: reduce tension to allow closure, reduce the risk of dehiscence, infection, and necrosis, reduce subcutaneous dead space and avoid a hematoma, allow closure of the large defect,  preserve normal anatomy, preserve normal anatomical and functional relationships and enhance both functionality and cosmetic results   Undermining: area extensively undermined   Undermining comment:  Undermining Defect 1.2cm Subcutaneous layers (deep stitches):  Suture size:  4-0 Suture type: Vicryl (polyglactin 910)   Subcutaneous suture technique: Inverted Dermal. Fine/surface layer approximation (top stitches):  Suture size:  4-0 Suture type: nylon   Stitches: horizontal mattress   Suture removal (days):  7 Hemostasis achieved with: pressure Outcome: patient tolerated procedure well with no complications   Post-procedure details: sterile dressing applied and wound care instructions given   Dressing type: bandage, pressure dressing and bacitracin (Mupirocin)    Specimen 1 - Surgical pathology Differential Diagnosis: D48.5 Cyst vs other  Check Margins: No Cystic pap 1.2cm  Cyst vs other, posterior neck, excised today  Start Mupirocin oint qd to excision site with dressing changes  AK (actinic keratosis) L post neck Residual AK vs other,  Plan recheck and possible LN2 on f/u  Actinic Damage - chronic, secondary to cumulative UV radiation exposure/sun exposure over time - diffuse scaly erythematous macules with underlying dyspigmentation - Recommend daily broad spectrum sunscreen SPF 30+ to sun-exposed areas, reapply every 2 hours as needed.  - Call for new or changing lesions.  Return in about 1 week (around 10/16/2020) for suture removal, Residual AK vs other LN2 L post neck.   I, [name], RMA, am acting as scribe for Sarina Ser, MD .  Documentation: I have reviewed the above documentation for accuracy and completeness, and I agree with the above.  Sarina Ser, MD

## 2020-10-16 ENCOUNTER — Encounter: Payer: Self-pay | Admitting: Dermatology

## 2020-10-16 ENCOUNTER — Other Ambulatory Visit: Payer: Self-pay

## 2020-10-16 ENCOUNTER — Ambulatory Visit (INDEPENDENT_AMBULATORY_CARE_PROVIDER_SITE_OTHER): Payer: Medicare HMO | Admitting: Dermatology

## 2020-10-16 DIAGNOSIS — L570 Actinic keratosis: Secondary | ICD-10-CM | POA: Diagnosis not present

## 2020-10-16 DIAGNOSIS — L720 Epidermal cyst: Secondary | ICD-10-CM

## 2020-10-16 DIAGNOSIS — L578 Other skin changes due to chronic exposure to nonionizing radiation: Secondary | ICD-10-CM

## 2020-10-16 DIAGNOSIS — Z4802 Encounter for removal of sutures: Secondary | ICD-10-CM

## 2020-10-16 NOTE — Progress Notes (Signed)
   Follow-Up Visit   Subjective  Casey Gallegos is a 86 y.o. male who presents for the following: post op./suture removal  (Patient is here today for suture removal for pathology proven cyst of the post neck) and Actinic Keratosis (L post neck - recheck and treat if persistent today).  The following portions of the chart were reviewed this encounter and updated as appropriate:   Tobacco  Allergies  Meds  Problems  Med Hx  Surg Hx  Fam Hx     Review of Systems:  No other skin or systemic complaints except as noted in HPI or Assessment and Plan.  Objective  Well appearing patient in no apparent distress; mood and affect are within normal limits.  A focused examination was performed including the neck . Relevant physical exam findings are noted in the Assessment and Plan.  Objective  L post neck x 1: Erythematous thin papules/macules with gritty scale.   Objective  Post neck: Healing excision site   Assessment & Plan  AK (actinic keratosis) L post neck x 1  Recheck at follow up appointment - if persistent plan biopsy   Destruction of lesion - L post neck x 1 Complexity: simple   Destruction method: cryotherapy   Informed consent: discussed and consent obtained   Timeout:  patient name, date of birth, surgical site, and procedure verified Lesion destroyed using liquid nitrogen: Yes   Region frozen until ice ball extended beyond lesion: Yes   Outcome: patient tolerated procedure well with no complications   Post-procedure details: wound care instructions given    Epidermal inclusion cyst Post neck  Encounter for Removal of Sutures - Incision site at the post neck is clean, dry and intact - Wound cleansed, sutures removed, wound cleansed and steri strips applied.  - Discussed pathology results showing a benign cyst  - Patient advised to keep steri-strips dry until they fall off. - Scars remodel for a full year. - Once steri-strips fall off, patient can apply  over-the-counter silicone scar cream each night to help with scar remodeling if desired. - Patient advised to call with any concerns or if they notice any new or changing lesions.   Actinic Damage - chronic, secondary to cumulative UV radiation exposure/sun exposure over time - diffuse scaly erythematous macules with underlying dyspigmentation - Recommend daily broad spectrum sunscreen SPF 30+ to sun-exposed areas, reapply every 2 hours as needed.  - Call for new or changing lesions.  Return in about 2 months (around 12/14/2020) for AK recheck, if + bx.  Luther Redo, CMA, am acting as scribe for Sarina Ser, MD .  Documentation: I have reviewed the above documentation for accuracy and completeness, and I agree with the above.  Sarina Ser, MD

## 2020-10-17 ENCOUNTER — Ambulatory Visit (INDEPENDENT_AMBULATORY_CARE_PROVIDER_SITE_OTHER): Payer: Medicare HMO

## 2020-10-17 DIAGNOSIS — I480 Paroxysmal atrial fibrillation: Secondary | ICD-10-CM

## 2020-10-17 LAB — POCT INR
INR: 1.7 — AB (ref 2.0–3.0)
PT: 20.9

## 2020-10-17 NOTE — Patient Instructions (Signed)
Description   Patient is to take 10 mg today and then resume taking 5 mg daily. Return in 3 weeks

## 2020-11-07 ENCOUNTER — Ambulatory Visit (INDEPENDENT_AMBULATORY_CARE_PROVIDER_SITE_OTHER): Payer: Medicare HMO

## 2020-11-07 ENCOUNTER — Other Ambulatory Visit: Payer: Self-pay

## 2020-11-07 DIAGNOSIS — I4811 Longstanding persistent atrial fibrillation: Secondary | ICD-10-CM | POA: Diagnosis not present

## 2020-11-07 DIAGNOSIS — I480 Paroxysmal atrial fibrillation: Secondary | ICD-10-CM

## 2020-11-07 DIAGNOSIS — M5136 Other intervertebral disc degeneration, lumbar region: Secondary | ICD-10-CM

## 2020-11-07 LAB — POCT INR
INR: 3.1 — AB (ref 2.0–3.0)
PT: 37.4

## 2020-11-07 NOTE — Patient Instructions (Signed)
Change to 5mg  daily except 2.5mg  on Wednesdays.  Recheck in 3 weeks.

## 2020-11-09 MED ORDER — OXYCODONE HCL 5 MG PO TABS: 5.0000 mg | ORAL_TABLET | ORAL | 0 refills | Status: DC | PRN

## 2020-11-28 ENCOUNTER — Ambulatory Visit (INDEPENDENT_AMBULATORY_CARE_PROVIDER_SITE_OTHER): Payer: Medicare HMO | Admitting: *Deleted

## 2020-11-28 ENCOUNTER — Ambulatory Visit: Payer: Self-pay | Admitting: Family Medicine

## 2020-11-28 ENCOUNTER — Other Ambulatory Visit: Payer: Self-pay

## 2020-11-28 DIAGNOSIS — I4811 Longstanding persistent atrial fibrillation: Secondary | ICD-10-CM | POA: Diagnosis not present

## 2020-11-28 DIAGNOSIS — I480 Paroxysmal atrial fibrillation: Secondary | ICD-10-CM

## 2020-11-28 LAB — POCT INR
INR: 2.2 (ref 2.0–3.0)
PT: 26.9

## 2020-11-28 NOTE — Patient Instructions (Signed)
Continue 5 mg daily except 2.5mg  on Wednesdays.  Recheck in 3 weeks.

## 2020-12-07 ENCOUNTER — Other Ambulatory Visit: Payer: Self-pay | Admitting: Family Medicine

## 2020-12-07 DIAGNOSIS — I10 Essential (primary) hypertension: Secondary | ICD-10-CM

## 2020-12-10 DIAGNOSIS — M5136 Other intervertebral disc degeneration, lumbar region: Secondary | ICD-10-CM | POA: Diagnosis not present

## 2020-12-10 DIAGNOSIS — M5416 Radiculopathy, lumbar region: Secondary | ICD-10-CM | POA: Diagnosis not present

## 2020-12-10 DIAGNOSIS — M48062 Spinal stenosis, lumbar region with neurogenic claudication: Secondary | ICD-10-CM | POA: Diagnosis not present

## 2020-12-19 ENCOUNTER — Ambulatory Visit: Payer: Medicare HMO | Admitting: Dermatology

## 2020-12-19 ENCOUNTER — Encounter: Payer: Self-pay | Admitting: Dermatology

## 2020-12-19 ENCOUNTER — Other Ambulatory Visit: Payer: Self-pay

## 2020-12-19 DIAGNOSIS — L570 Actinic keratosis: Secondary | ICD-10-CM | POA: Diagnosis not present

## 2020-12-19 DIAGNOSIS — L821 Other seborrheic keratosis: Secondary | ICD-10-CM | POA: Diagnosis not present

## 2020-12-19 DIAGNOSIS — Z872 Personal history of diseases of the skin and subcutaneous tissue: Secondary | ICD-10-CM

## 2020-12-19 DIAGNOSIS — L578 Other skin changes due to chronic exposure to nonionizing radiation: Secondary | ICD-10-CM

## 2020-12-19 DIAGNOSIS — L814 Other melanin hyperpigmentation: Secondary | ICD-10-CM | POA: Diagnosis not present

## 2020-12-19 NOTE — Progress Notes (Signed)
   Follow-Up Visit   Subjective  Casey Gallegos is a 86 y.o. male who presents for the following: Actinic Keratosis (Recheck the L post neck for persistence ). The patient has other spots to be checked today.  The following portions of the chart were reviewed this encounter and updated as appropriate:   Tobacco  Allergies  Meds  Problems  Med Hx  Surg Hx  Fam Hx     Review of Systems:  No other skin or systemic complaints except as noted in HPI or Assessment and Plan.  Objective  Well appearing patient in no apparent distress; mood and affect are within normal limits.  A focused examination was performed including the face and neck. Relevant physical exam findings are noted in the Assessment and Plan.  Objective  L post neck: Clear.   Objective  L forehead x 1: Erythematous thin papules/macules with gritty scale.   Assessment & Plan  History of actinic keratosis L post neck  Clear. Observe for recurrence. Call clinic for new or changing lesions.  Recommend regular skin exams, daily broad-spectrum spf 30+ sunscreen use, and photoprotection.     AK (actinic keratosis) L forehead x 1  Destruction of lesion - L forehead x 1 Complexity: simple   Destruction method: cryotherapy   Informed consent: discussed and consent obtained   Timeout:  patient name, date of birth, surgical site, and procedure verified Lesion destroyed using liquid nitrogen: Yes   Region frozen until ice ball extended beyond lesion: Yes   Outcome: patient tolerated procedure well with no complications   Post-procedure details: wound care instructions given     Actinic Damage - chronic, secondary to cumulative UV radiation exposure/sun exposure over time - diffuse scaly erythematous macules with underlying dyspigmentation - Recommend daily broad spectrum sunscreen SPF 30+ to sun-exposed areas, reapply every 2 hours as needed.  - Recommend staying in the shade or wearing long sleeves, sun glasses  (UVA+UVB protection) and wide brim hats (4-inch brim around the entire circumference of the hat). - Call for new or changing lesions.  Lentigines - Scattered tan macules - Due to sun exposure - Benign-appering, observe - Recommend daily broad spectrum sunscreen SPF 30+ to sun-exposed areas, reapply every 2 hours as needed. - Call for any changes  Seborrheic Keratoses - Stuck-on, waxy, tan-brown papules and/or plaques  - Benign-appearing - Discussed benign etiology and prognosis. - Observe - Call for any changes  Return in about 1 year (around 12/19/2021) for UBSE.  Luther Redo, CMA, am acting as scribe for Sarina Ser, MD .  Documentation: I have reviewed the above documentation for accuracy and completeness, and I agree with the above.  Sarina Ser, MD

## 2020-12-20 ENCOUNTER — Encounter: Payer: Self-pay | Admitting: Dermatology

## 2020-12-20 DIAGNOSIS — R791 Abnormal coagulation profile: Secondary | ICD-10-CM | POA: Diagnosis not present

## 2020-12-21 DIAGNOSIS — M5416 Radiculopathy, lumbar region: Secondary | ICD-10-CM | POA: Diagnosis not present

## 2020-12-21 DIAGNOSIS — M5136 Other intervertebral disc degeneration, lumbar region: Secondary | ICD-10-CM | POA: Diagnosis not present

## 2020-12-21 DIAGNOSIS — R791 Abnormal coagulation profile: Secondary | ICD-10-CM | POA: Diagnosis not present

## 2020-12-21 DIAGNOSIS — M48062 Spinal stenosis, lumbar region with neurogenic claudication: Secondary | ICD-10-CM | POA: Diagnosis not present

## 2020-12-24 ENCOUNTER — Encounter: Payer: Self-pay | Admitting: Family Medicine

## 2020-12-24 ENCOUNTER — Other Ambulatory Visit: Payer: Self-pay

## 2020-12-24 ENCOUNTER — Ambulatory Visit (INDEPENDENT_AMBULATORY_CARE_PROVIDER_SITE_OTHER): Payer: Medicare HMO | Admitting: Family Medicine

## 2020-12-24 VITALS — BP 131/75 | HR 59 | Temp 98.4°F | Resp 16 | Ht 69.5 in | Wt 188.0 lb

## 2020-12-24 DIAGNOSIS — M5442 Lumbago with sciatica, left side: Secondary | ICD-10-CM

## 2020-12-24 DIAGNOSIS — I10 Essential (primary) hypertension: Secondary | ICD-10-CM | POA: Diagnosis not present

## 2020-12-24 DIAGNOSIS — G8929 Other chronic pain: Secondary | ICD-10-CM

## 2020-12-24 DIAGNOSIS — M5441 Lumbago with sciatica, right side: Secondary | ICD-10-CM

## 2020-12-24 DIAGNOSIS — M9979 Connective tissue and disc stenosis of intervertebral foramina of abdomen and other regions: Secondary | ICD-10-CM

## 2020-12-24 DIAGNOSIS — I482 Chronic atrial fibrillation, unspecified: Secondary | ICD-10-CM

## 2020-12-24 NOTE — Patient Instructions (Signed)
Alpha lipoic acid daily.

## 2020-12-24 NOTE — Progress Notes (Signed)
I,Casey Gallegos,acting as a scribe for [name], MD.,have documented all relevant documentation on the behalf of [name], MD,as directed by  [name], MD while in the presence of [name], MD.   Established patient visit   [name]   DOB: 1934-11-12   86 y.o. Male  MRN: [id number] Visit Date: 12/24/2020  Today's healthcare provider: [name], MD   Chief Complaint  Patient presents with  . Follow-up  . Hypertension   Subjective    HPI  Patient comes in today for follow-up.  He feels pretty well but his neuropathy is slowly getting worse. He remains active daily.  He takes medications as prescribed. Hypertension, follow-up  BP Readings from Last 3 Encounters:  12/24/20 131/75  09/05/20 140/74  07/25/20 131/71   Wt Readings from Last 3 Encounters:  12/24/20 188 lb (85.3 kg)  09/05/20 199 lb (90.3 kg)  07/25/20 205 lb (93 kg)     He was last seen for hypertension 5 months ago.  BP at that visit was 131/71. Management since that visit includes; amlodipine and Coreg. He reports good compliance with treatment. He is not having side effects.  He is not exercising. He is adherent to low salt diet.   Outside blood pressures are checked occasionally.  He does not smoke.  Use of agents associated with hypertension: none.         Medications: Outpatient Medications Prior to Visit  Medication Sig  . amLODipine (NORVASC) 5 MG tablet TAKE 1 TABLET EVERY DAY  . benazepril (LOTENSIN) 40 MG tablet TAKE 1 TABLET EVERY DAY  . carvedilol (COREG) 6.25 MG tablet TAKE 1 TABLET TWICE DAILY  . CORTIZONE-10 DIABETICS SKIN 1 % lotion Apply 1 application topically as needed.   . doxazosin (CARDURA) 8 MG tablet TAKE 1 TABLET EVERY DAY  . finasteride (PROSCAR) 5 MG tablet TAKE 1 TABLET EVERY DAY  . gabapentin (NEURONTIN) 300 MG capsule TAKE 1 CAPSULE EVERY MORNING, TAKE 1 IN THE AFTERNOON AND TAKE 2 CAPSULES AT BED TIME   . Magnesium 500 MG TABS Take by mouth daily.   . Multiple Vitamin (MULTI-VITAMIN) tablet Take 1 tablet by mouth daily.   . MULTIPLE VITAMIN PO Take by mouth daily.   Marland Kitchen oxyCODONE (OXY IR/ROXICODONE) 5 MG immediate release tablet Take 1 tablet (5 mg total) by mouth every 4 (four) hours as needed for severe pain.  Marland Kitchen triamterene-hydrochlorothiazide (MAXZIDE-25) 37.5-25 MG tablet TAKE 1/2 TABLET EVERY DAY  . warfarin (COUMADIN) 1 MG tablet Take by mouth.   . warfarin (COUMADIN) 4 MG tablet TAKE 1 TABLET EVERY OTHER DAY AT 6PM ALTERNATING WITH 5MG   . warfarin (COUMADIN) 5 MG tablet TAKE 1 TABLET EVERY DAY  AT  6PM   No facility-administered medications prior to visit.    Review of Systems  Constitutional: Negative for appetite change, chills and fever.  Respiratory: Negative for chest tightness, shortness of breath and wheezing.   Cardiovascular: Negative for chest pain and palpitations.  Gastrointestinal: Negative for abdominal pain, nausea and vomiting.        Objective    BP 131/75   Pulse (!) 59   Temp 98.4 F (36.9 C)   Resp 16   Ht 5' 9.5" (1.765 m)   Wt 188 lb (85.3 kg)   BMI 27.36 kg/m  BP Readings from Last 3 Encounters:  12/24/20 131/75  09/05/20 140/74  07/25/20 131/71   Wt Readings from Last 3  Encounters:  12/24/20 188 lb (85.3 kg)  09/05/20 199 lb (90.3 kg)  07/25/20 205 lb (93 kg)       Physical Exam Vitals reviewed.  Constitutional:      Appearance: He is well-developed.  HENT:     Head: Normocephalic and atraumatic.     Right Ear: External ear normal.     Left Ear: External ear normal.     Nose: Nose normal.  Eyes:     General: No scleral icterus.    Conjunctiva/sclera: Conjunctivae normal.  Neck:     Thyroid: No thyromegaly.  Cardiovascular:     Rate and Rhythm: Normal rate and regular rhythm.     Heart sounds: Normal heart sounds.  Pulmonary:     Effort: Pulmonary effort is normal.     Breath sounds: Normal breath sounds.  Abdominal:      Palpations: Abdomen is soft.  Skin:    General: Skin is warm and dry.     Capillary Refill: Capillary refill takes 2 to 3 seconds.     Comments: Very well tanned.  Neurological:     Mental Status: He is alert and oriented to person, place, and time. Mental status is at baseline.     Comments: Mild decrease sensation of feet legs distally  Psychiatric:        Mood and Affect: Mood normal.        Behavior: Behavior normal.        Thought Content: Thought content normal.        Judgment: Judgment normal.       No results found for any visits on 12/24/20.  Assessment & Plan      1. Chronic atrial fibrillation (HCC) Lifelong Coumadin.  INR between 2-3  2. Primary hypertension Well-controlled on benazepril and carvedilol and Maxide  3. 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  4. Chronic bilateral low back pain with bilateral sciatica Chronic neuropathy.  Patient already on gabapentin a good dose.  Try daily alpha lipoic acid.   No follow-ups on file.      I, [name], MD, have reviewed all documentation for this visit. The documentation on 12/26/20 for the exam, diagnosis, procedures, and orders are all accurate and complete.    Kishia Shackett Cranford Mon, MD  [facility] [phone] (phone) [phone] (fax)  [place]

## 2021-01-14 DIAGNOSIS — M48062 Spinal stenosis, lumbar region with neurogenic claudication: Secondary | ICD-10-CM | POA: Diagnosis not present

## 2021-01-14 DIAGNOSIS — M5416 Radiculopathy, lumbar region: Secondary | ICD-10-CM | POA: Diagnosis not present

## 2021-01-14 DIAGNOSIS — M5136 Other intervertebral disc degeneration, lumbar region: Secondary | ICD-10-CM | POA: Diagnosis not present

## 2021-01-23 ENCOUNTER — Ambulatory Visit (INDEPENDENT_AMBULATORY_CARE_PROVIDER_SITE_OTHER): Payer: Medicare HMO | Admitting: *Deleted

## 2021-01-23 ENCOUNTER — Other Ambulatory Visit: Payer: Self-pay

## 2021-01-23 DIAGNOSIS — I480 Paroxysmal atrial fibrillation: Secondary | ICD-10-CM | POA: Diagnosis not present

## 2021-01-23 DIAGNOSIS — I482 Chronic atrial fibrillation, unspecified: Secondary | ICD-10-CM

## 2021-01-23 LAB — POCT INR
INR: 2.2 (ref 2.0–3.0)
PT: 26.3

## 2021-01-23 NOTE — Patient Instructions (Signed)
Continue 5 mg daily except 2.5mg  on Wednesdays.  Patient will have PT/INR checked at [facility] 02/06/2021 for a procedure. Than will resume schedule here 03/06/2021.

## 2021-02-06 DIAGNOSIS — Z7901 Long term (current) use of anticoagulants: Secondary | ICD-10-CM | POA: Diagnosis not present

## 2021-02-07 DIAGNOSIS — M5416 Radiculopathy, lumbar region: Secondary | ICD-10-CM | POA: Diagnosis not present

## 2021-02-07 DIAGNOSIS — M48062 Spinal stenosis, lumbar region with neurogenic claudication: Secondary | ICD-10-CM | POA: Diagnosis not present

## 2021-02-15 ENCOUNTER — Other Ambulatory Visit: Payer: Self-pay | Admitting: Family Medicine

## 2021-02-18 ENCOUNTER — Other Ambulatory Visit: Payer: Self-pay | Admitting: Family Medicine

## 2021-02-18 ENCOUNTER — Other Ambulatory Visit: Payer: Self-pay | Admitting: Urology

## 2021-03-04 DIAGNOSIS — M5136 Other intervertebral disc degeneration, lumbar region: Secondary | ICD-10-CM | POA: Diagnosis not present

## 2021-03-04 DIAGNOSIS — M48062 Spinal stenosis, lumbar region with neurogenic claudication: Secondary | ICD-10-CM | POA: Diagnosis not present

## 2021-03-04 DIAGNOSIS — M5416 Radiculopathy, lumbar region: Secondary | ICD-10-CM | POA: Diagnosis not present

## 2021-03-06 ENCOUNTER — Ambulatory Visit (INDEPENDENT_AMBULATORY_CARE_PROVIDER_SITE_OTHER): Payer: Medicare HMO

## 2021-03-06 ENCOUNTER — Other Ambulatory Visit: Payer: Self-pay

## 2021-03-06 DIAGNOSIS — I480 Paroxysmal atrial fibrillation: Secondary | ICD-10-CM

## 2021-03-06 LAB — POCT INR
INR: 2.5 (ref 2.0–3.0)
PT: 29.7

## 2021-03-06 NOTE — Patient Instructions (Signed)
Description   Continue 5 mg daily except 2.5mg  on Wednesdays.

## 2021-03-11 ENCOUNTER — Other Ambulatory Visit: Payer: Self-pay | Admitting: Family Medicine

## 2021-03-11 DIAGNOSIS — I10 Essential (primary) hypertension: Secondary | ICD-10-CM

## 2021-03-11 NOTE — Telephone Encounter (Signed)
Requested medications are due for refill today.  yes  Requested medications are on the active medications list.  yes  Last refill. 09/28/2020  Future visit scheduled.   yes  Notes to clinic.  Medication not delegated.

## 2021-03-11 NOTE — Telephone Encounter (Signed)
Requested Prescriptions  Pending Prescriptions Disp Refills  . triamterene-hydrochlorothiazide (MAXZIDE-25) 37.5-25 MG tablet [Pharmacy Med Name: TRIAMTERENE/HYDROCHLOROTHIAZIDE 37.5-25 MG Tablet] 45 tablet 0    Sig: TAKE 1/2 TABLET EVERY DAY     Cardiovascular: Diuretic Combos Passed - 03/11/2021  6:05 PM      Passed - K in normal range and within 360 days    Potassium  Date Value Ref Range Status  07/25/2020 4.5 3.5 - 5.2 mmol/L Final         Passed - Na in normal range and within 360 days    Sodium  Date Value Ref Range Status  07/25/2020 141 134 - 144 mmol/L Final         Passed - Cr in normal range and within 360 days    Creatinine, Ser  Date Value Ref Range Status  07/25/2020 1.10 0.76 - 1.27 mg/dL Final         Passed - Ca in normal range and within 360 days    Calcium  Date Value Ref Range Status  07/25/2020 9.0 8.6 - 10.2 mg/dL Final         Passed - Last BP in normal range    BP Readings from Last 1 Encounters:  12/24/20 131/75         Passed - Valid encounter within last 6 months    Recent Outpatient Visits          2 months ago Chronic atrial fibrillation [facility])   [facility] Jerrol Banana., MD   7 months ago Annual physical exam   [facility] Jerrol Banana., MD   11 months ago AF (paroxysmal atrial fibrillation) [facility])   [facility] Jerrol Banana., MD   1 year ago AF (paroxysmal atrial fibrillation) [facility])   [facility] Jerrol Banana., MD   1 year ago Annual physical exam   [facility] Jerrol Banana., MD      Future Appointments            In 1 month Jerrol Banana., MD [facility] [place]   In 5 months McGowan, Gordan Payment [place]   In 9 months Ralene Bathe, MD [place]           . warfarin (COUMADIN) 5 MG tablet [Pharmacy Med Name: WARFARIN SODIUM 5 MG Tablet] 90  tablet 1    Sig: TAKE 1 TABLET EVERY DAY  AT  6PM     Hematology:  Anticoagulants - warfarin Failed - 03/11/2021  6:05 PM      Failed - This refill cannot be delegated      Failed - If the patient is managed by Coumadin Clinic - route to their Pool. If not, forward to the provider.      Passed - INR in normal range and within 30 days    INR  Date Value Ref Range Status  03/06/2021 2.5 2.0 - 3.0 Final  07/08/2017 1.8 (H)  Final    Comment:    Reference Range                     0.9-1.1 Moderate-intensity Warfarin Therapy 2.0-3.0 Higher-intensity Warfarin Therapy   3.0-4.0  .          Passed - Valid encounter within last 3 months    Recent Outpatient Visits          2  months ago Chronic atrial fibrillation [facility])   [facility] Jerrol Banana., MD   7 months ago Annual physical exam   [facility] Jerrol Banana., MD   11 months ago AF (paroxysmal atrial fibrillation) [facility])   [facility] Jerrol Banana., MD   1 year ago AF (paroxysmal atrial fibrillation) [facility])   [facility] Jerrol Banana., MD   1 year ago Annual physical exam   [facility] Jerrol Banana., MD      Future Appointments            In 1 month Jerrol Banana., MD [facility] [place]   In 5 months McGowan, Gordan Payment [place]   In 9 months Ralene Bathe, MD [place]

## 2021-03-31 ENCOUNTER — Other Ambulatory Visit: Payer: Self-pay | Admitting: Family Medicine

## 2021-03-31 DIAGNOSIS — R202 Paresthesia of skin: Secondary | ICD-10-CM

## 2021-03-31 NOTE — Telephone Encounter (Signed)
Requested Prescriptions  Pending Prescriptions Disp Refills  . gabapentin (NEURONTIN) 300 MG capsule [Pharmacy Med Name: GABAPENTIN 300 MG Capsule] 360 capsule 3    Sig: TAKE 1 CAPSULE EVERY MORNING, 1 CAPSULE IN THE AFTERNOON AND 2 CAPSULES AT BEDTIME     Neurology: Anticonvulsants - gabapentin Passed - 03/31/2021  8:37 AM      Passed - Valid encounter within last 12 months    Recent Outpatient Visits          3 months ago Chronic atrial fibrillation [facility])   [facility] Jerrol Banana., MD   8 months ago Annual physical exam   [facility] Jerrol Banana., MD   12 months ago AF (paroxysmal atrial fibrillation) [facility])   [facility] Jerrol Banana., MD   1 year ago AF (paroxysmal atrial fibrillation) [facility])   [facility] Jerrol Banana., MD   1 year ago Annual physical exam   [facility] Jerrol Banana., MD      Future Appointments            In 1 month Jerrol Banana., MD [facility] [place]   In 5 months McGowan, Gordan Payment [place]   In 8 months Ralene Bathe, MD [place]

## 2021-04-03 ENCOUNTER — Other Ambulatory Visit: Payer: Self-pay

## 2021-04-03 ENCOUNTER — Ambulatory Visit (INDEPENDENT_AMBULATORY_CARE_PROVIDER_SITE_OTHER): Payer: Medicare HMO

## 2021-04-03 DIAGNOSIS — I480 Paroxysmal atrial fibrillation: Secondary | ICD-10-CM

## 2021-04-03 LAB — POCT INR
INR: 3.5 — AB (ref 2.0–3.0)
PT: 15

## 2021-04-03 NOTE — Patient Instructions (Signed)
Description   Continue 5 mg daily except 2.5mg  on Monday, Wednesday, and Friday

## 2021-04-17 ENCOUNTER — Other Ambulatory Visit: Payer: Self-pay

## 2021-04-17 ENCOUNTER — Ambulatory Visit (INDEPENDENT_AMBULATORY_CARE_PROVIDER_SITE_OTHER): Payer: Medicare HMO

## 2021-04-17 DIAGNOSIS — I482 Chronic atrial fibrillation, unspecified: Secondary | ICD-10-CM

## 2021-04-17 DIAGNOSIS — I480 Paroxysmal atrial fibrillation: Secondary | ICD-10-CM | POA: Diagnosis not present

## 2021-04-17 LAB — POCT INR
INR: 1.8 — AB (ref 2.0–3.0)
Prothrombin Time: 21.4

## 2021-04-17 NOTE — Patient Instructions (Signed)
Description    5 mg daily except 2.5mg  on Monday and Friday

## 2021-05-01 ENCOUNTER — Encounter: Payer: Self-pay | Admitting: Family Medicine

## 2021-05-01 ENCOUNTER — Other Ambulatory Visit: Payer: Self-pay

## 2021-05-01 ENCOUNTER — Ambulatory Visit (INDEPENDENT_AMBULATORY_CARE_PROVIDER_SITE_OTHER): Payer: Medicare HMO | Admitting: Family Medicine

## 2021-05-01 VITALS — BP 149/83 | HR 52 | Resp 18 | Ht 69.0 in | Wt 180.0 lb

## 2021-05-01 DIAGNOSIS — I480 Paroxysmal atrial fibrillation: Secondary | ICD-10-CM | POA: Diagnosis not present

## 2021-05-01 DIAGNOSIS — I10 Essential (primary) hypertension: Secondary | ICD-10-CM | POA: Diagnosis not present

## 2021-05-01 DIAGNOSIS — M9979 Connective tissue and disc stenosis of intervertebral foramina of abdomen and other regions: Secondary | ICD-10-CM | POA: Diagnosis not present

## 2021-05-01 DIAGNOSIS — E782 Mixed hyperlipidemia: Secondary | ICD-10-CM | POA: Diagnosis not present

## 2021-05-01 DIAGNOSIS — N189 Chronic kidney disease, unspecified: Secondary | ICD-10-CM | POA: Diagnosis not present

## 2021-05-01 NOTE — Progress Notes (Signed)
I,[name],acting as a scribe for [name], MD.,have documented all relevant documentation on the behalf of [name], MD,as directed by  [name], MD while in the presence of [name], MD.   Established patient visit   [name]   DOB: 01-19-1935   86 y.o. Male  MRN: [id number] Visit Date: 05/01/2021  Today's healthcare provider: [name], MD   Chief Complaint  Patient presents with   Follow-up   Hypertension   Hyperlipidemia   Subjective    HPI  Patient comes in today for follow-up.  He has had a 35 pound intentional weight loss working on dietary changes.  He is feeling fairly well.  He does have some irritation around his tailbone that has occurred as he has lost his weight.  Home blood pressure today was 117/74. Hypertension, follow-up  BP Readings from Last 3 Encounters:  05/01/21 (!) 149/83  12/24/20 131/75  09/05/20 140/74   Wt Readings from Last 3 Encounters:  05/01/21 180 lb (81.6 kg)  12/24/20 188 lb (85.3 kg)  09/05/20 199 lb (90.3 kg)     He was last seen for hypertension 4 months ago.  BP at that visit was 131/75. Management since that visit includes; Well-controlled on benazepril and carvedilol and Maxide. He reports good compliance with treatment. He is not having side effects. none He is exercising. He is not adherent to low salt diet.   Outside blood pressures are 115/74.  He does not smoke.  Use of agents associated with hypertension: none.   ---------------------------------------------------------------------------------------------------     Medications: Outpatient Medications Prior to Visit  Medication Sig   amLODipine (NORVASC) 5 MG tablet TAKE 1 TABLET EVERY DAY   benazepril (LOTENSIN) 40 MG tablet TAKE 1 TABLET EVERY DAY   carvedilol (COREG) 6.25 MG tablet TAKE 1 TABLET TWICE DAILY   CORTIZONE-10 DIABETICS SKIN 1 % lotion Apply 1 application topically as needed.     doxazosin (CARDURA) 8 MG tablet TAKE 1 TABLET EVERY DAY   finasteride (PROSCAR) 5 MG tablet TAKE 1 TABLET EVERY DAY   gabapentin (NEURONTIN) 300 MG capsule TAKE 1 CAPSULE EVERY MORNING, 1 CAPSULE IN THE AFTERNOON AND 2 CAPSULES AT BEDTIME   Magnesium 500 MG TABS Take by mouth daily.    Multiple Vitamin (MULTI-VITAMIN) tablet Take 1 tablet by mouth daily.    MULTIPLE VITAMIN PO Take by mouth daily.    oxyCODONE (OXY IR/ROXICODONE) 5 MG immediate release tablet Take 1 tablet (5 mg total) by mouth every 4 (four) hours as needed for severe pain.   triamterene-hydrochlorothiazide (MAXZIDE-25) 37.5-25 MG tablet TAKE 1/2 TABLET EVERY DAY   warfarin (COUMADIN) 1 MG tablet Take by mouth.    warfarin (COUMADIN) 4 MG tablet TAKE 1 TABLET EVERY OTHER DAY AT 6PM ALTERNATING WITH 5MG    warfarin (COUMADIN) 5 MG tablet TAKE 1 TABLET EVERY DAY  AT  6PM   No facility-administered medications prior to visit.    Review of Systems  Constitutional:  Negative for appetite change, chills and fever.  Respiratory:  Negative for chest tightness, shortness of breath and wheezing.   Cardiovascular:  Negative for chest pain and palpitations.  Gastrointestinal:  Negative for abdominal pain, nausea and vomiting.       Objective    BP (!) 149/83 (BP Location: Left Arm, Patient Position: Sitting, Cuff Size: Normal)   Pulse (!) 52   Resp 18   Ht 5\' 9"  (1.753 m)  Wt 180 lb (81.6 kg)   SpO2 98%   BMI 26.58 kg/m  BP Readings from Last 3 Encounters:  05/01/21 (!) 149/83  12/24/20 131/75  09/05/20 140/74   Wt Readings from Last 3 Encounters:  05/01/21 180 lb (81.6 kg)  12/24/20 188 lb (85.3 kg)  09/05/20 199 lb (90.3 kg)      Physical Exam Vitals reviewed.  Constitutional:      Appearance: He is well-developed.  HENT:     Head: Normocephalic and atraumatic.     Right Ear: External ear normal.     Left Ear: External ear normal.     Nose: Nose normal.  Eyes:     General: No scleral icterus.     Conjunctiva/sclera: Conjunctivae normal.  Neck:     Thyroid: No thyromegaly.  Cardiovascular:     Rate and Rhythm: Normal rate and regular rhythm.     Heart sounds: Normal heart sounds.  Pulmonary:     Effort: Pulmonary effort is normal.     Breath sounds: Normal breath sounds.  Abdominal:     Palpations: Abdomen is soft.  Skin:    General: Skin is warm and dry.     Capillary Refill: Capillary refill takes 2 to 3 seconds.     Comments: Very well tanned.  Mild tenderness all around the coccyx with some hypertrophic skin there but no discernible skin lesions, certainly no skin breakdown at this time.  Neurological:     Mental Status: He is alert and oriented to person, place, and time. Mental status is at baseline.     Comments: Mild decrease sensation of feet legs distally  Psychiatric:        Mood and Affect: Mood normal.        Behavior: Behavior normal.        Thought Content: Thought content normal.        Judgment: Judgment normal.      No results found for any visits on 05/01/21.  Assessment & Plan     1. Essential hypertension Good home blood pressure readings.  On amlodipine benazepril Coreg and Maxide - Lipid panel - TSH - CBC w/Diff/Platelet - Comprehensive Metabolic Panel (CMET)  2. Combined fat and carbohydrate induced hyperlipemia  - Lipid panel - TSH - CBC w/Diff/Platelet - Comprehensive Metabolic Panel (CMET)  3. AF (paroxysmal atrial fibrillation) (HCC) On Coumadin - Lipid panel - TSH - CBC w/Diff/Platelet - Comprehensive Metabolic Panel (CMET)  4. Chronic kidney disease, unspecified CKD stage  - Lipid panel - TSH - CBC w/Diff/Platelet - Comprehensive Metabolic Panel (CMET)   Return in about 4 months (around 08/31/2021).      I, [name], MD, have reviewed all documentation for this visit. The documentation on 05/04/21 for the exam, diagnosis, procedures, and orders are all accurate and complete.    Monserat Prestigiacomo Cranford Mon, MD   [facility] [phone] (phone) [phone] (fax)  [place]

## 2021-05-06 DIAGNOSIS — Z7901 Long term (current) use of anticoagulants: Secondary | ICD-10-CM | POA: Diagnosis not present

## 2021-05-06 DIAGNOSIS — M5416 Radiculopathy, lumbar region: Secondary | ICD-10-CM | POA: Diagnosis not present

## 2021-05-06 DIAGNOSIS — M48062 Spinal stenosis, lumbar region with neurogenic claudication: Secondary | ICD-10-CM | POA: Diagnosis not present

## 2021-05-06 DIAGNOSIS — M5136 Other intervertebral disc degeneration, lumbar region: Secondary | ICD-10-CM | POA: Diagnosis not present

## 2021-05-06 DIAGNOSIS — R200 Anesthesia of skin: Secondary | ICD-10-CM | POA: Diagnosis not present

## 2021-05-08 ENCOUNTER — Ambulatory Visit (INDEPENDENT_AMBULATORY_CARE_PROVIDER_SITE_OTHER): Payer: Medicare HMO

## 2021-05-08 ENCOUNTER — Other Ambulatory Visit: Payer: Self-pay

## 2021-05-08 DIAGNOSIS — M5136 Other intervertebral disc degeneration, lumbar region: Secondary | ICD-10-CM

## 2021-05-08 DIAGNOSIS — I482 Chronic atrial fibrillation, unspecified: Secondary | ICD-10-CM

## 2021-05-08 DIAGNOSIS — I480 Paroxysmal atrial fibrillation: Secondary | ICD-10-CM

## 2021-05-08 LAB — POCT INR
INR: 1.9 — AB (ref 2.0–3.0)
PT: 23

## 2021-05-08 MED ORDER — OXYCODONE HCL 5 MG PO TABS: 5.0000 mg | ORAL_TABLET | ORAL | 0 refills | Status: DC | PRN

## 2021-05-08 NOTE — Patient Instructions (Signed)
Increase to 5mg  daily recheck in three weeks.

## 2021-05-14 DIAGNOSIS — Z7901 Long term (current) use of anticoagulants: Secondary | ICD-10-CM | POA: Diagnosis not present

## 2021-05-15 DIAGNOSIS — M48062 Spinal stenosis, lumbar region with neurogenic claudication: Secondary | ICD-10-CM | POA: Diagnosis not present

## 2021-05-15 DIAGNOSIS — M5416 Radiculopathy, lumbar region: Secondary | ICD-10-CM | POA: Diagnosis not present

## 2021-05-16 DIAGNOSIS — R69 Illness, unspecified: Secondary | ICD-10-CM | POA: Diagnosis not present

## 2021-06-04 DIAGNOSIS — I420 Dilated cardiomyopathy: Secondary | ICD-10-CM | POA: Diagnosis not present

## 2021-06-04 DIAGNOSIS — E782 Mixed hyperlipidemia: Secondary | ICD-10-CM | POA: Diagnosis not present

## 2021-06-04 DIAGNOSIS — I482 Chronic atrial fibrillation, unspecified: Secondary | ICD-10-CM | POA: Diagnosis not present

## 2021-06-04 DIAGNOSIS — I10 Essential (primary) hypertension: Secondary | ICD-10-CM | POA: Diagnosis not present

## 2021-06-05 ENCOUNTER — Other Ambulatory Visit: Payer: Self-pay

## 2021-06-05 ENCOUNTER — Ambulatory Visit (INDEPENDENT_AMBULATORY_CARE_PROVIDER_SITE_OTHER): Payer: Medicare HMO

## 2021-06-05 DIAGNOSIS — I480 Paroxysmal atrial fibrillation: Secondary | ICD-10-CM

## 2021-06-05 DIAGNOSIS — I482 Chronic atrial fibrillation, unspecified: Secondary | ICD-10-CM

## 2021-06-05 LAB — POCT INR
INR: 3 (ref 2.0–3.0)
PT: 36.1

## 2021-06-05 NOTE — Patient Instructions (Signed)
Continue 5mg  daily.  Recheck in four weeks.

## 2021-06-10 DIAGNOSIS — M5136 Other intervertebral disc degeneration, lumbar region: Secondary | ICD-10-CM | POA: Diagnosis not present

## 2021-06-10 DIAGNOSIS — M48062 Spinal stenosis, lumbar region with neurogenic claudication: Secondary | ICD-10-CM | POA: Diagnosis not present

## 2021-06-10 DIAGNOSIS — M5416 Radiculopathy, lumbar region: Secondary | ICD-10-CM | POA: Diagnosis not present

## 2021-06-11 DIAGNOSIS — I482 Chronic atrial fibrillation, unspecified: Secondary | ICD-10-CM | POA: Diagnosis not present

## 2021-06-19 DIAGNOSIS — I482 Chronic atrial fibrillation, unspecified: Secondary | ICD-10-CM | POA: Diagnosis not present

## 2021-06-27 DIAGNOSIS — I420 Dilated cardiomyopathy: Secondary | ICD-10-CM | POA: Diagnosis not present

## 2021-06-27 DIAGNOSIS — I10 Essential (primary) hypertension: Secondary | ICD-10-CM | POA: Diagnosis not present

## 2021-06-27 DIAGNOSIS — E782 Mixed hyperlipidemia: Secondary | ICD-10-CM | POA: Diagnosis not present

## 2021-06-27 DIAGNOSIS — I482 Chronic atrial fibrillation, unspecified: Secondary | ICD-10-CM | POA: Diagnosis not present

## 2021-06-28 DIAGNOSIS — H26491 Other secondary cataract, right eye: Secondary | ICD-10-CM | POA: Diagnosis not present

## 2021-07-03 ENCOUNTER — Ambulatory Visit: Payer: Medicare HMO

## 2021-07-03 ENCOUNTER — Ambulatory Visit (INDEPENDENT_AMBULATORY_CARE_PROVIDER_SITE_OTHER): Payer: Medicare HMO

## 2021-07-03 ENCOUNTER — Other Ambulatory Visit: Payer: Self-pay

## 2021-07-03 DIAGNOSIS — I480 Paroxysmal atrial fibrillation: Secondary | ICD-10-CM | POA: Diagnosis not present

## 2021-07-03 DIAGNOSIS — Z23 Encounter for immunization: Secondary | ICD-10-CM | POA: Diagnosis not present

## 2021-07-03 LAB — POCT INR
INR: 3 (ref 2.0–3.0)
PT: 36.1

## 2021-07-03 NOTE — Patient Instructions (Signed)
Description   Continue 5mg  daily.  Recheck in four weeks.

## 2021-07-05 ENCOUNTER — Other Ambulatory Visit: Payer: Self-pay | Admitting: Family Medicine

## 2021-07-10 ENCOUNTER — Other Ambulatory Visit: Payer: Self-pay | Admitting: Family Medicine

## 2021-07-10 DIAGNOSIS — I10 Essential (primary) hypertension: Secondary | ICD-10-CM

## 2021-07-10 NOTE — Telephone Encounter (Signed)
Requested Prescriptions  Pending Prescriptions Disp Refills  . benazepril (LOTENSIN) 40 MG tablet [Pharmacy Med Name: BENAZEPRIL HCL 40 MG Tablet] 90 tablet 0    Sig: TAKE 1 TABLET EVERY DAY     Cardiovascular:  ACE Inhibitors Failed - 07/10/2021 10:09 AM      Failed - Cr in normal range and within 180 days    Creatinine, Ser  Date Value Ref Range Status  07/25/2020 1.10 0.76 - 1.27 mg/dL Final         Failed - K in normal range and within 180 days    Potassium  Date Value Ref Range Status  07/25/2020 4.5 3.5 - 5.2 mmol/L Final         Failed - Last BP in normal range    BP Readings from Last 1 Encounters:  05/01/21 (!) 149/83         Passed - Patient is not pregnant      Passed - Valid encounter within last 6 months    Recent Outpatient Visits          2 months ago Essential hypertension   [place], Richard L Jr., MD   6 months ago Chronic atrial fibrillation [facility])   [facility] Jerrol Banana., MD   11 months ago Annual physical exam   [facility] Jerrol Banana., MD   1 year ago AF (paroxysmal atrial fibrillation) [facility])   [facility] Jerrol Banana., MD   1 year ago AF (paroxysmal atrial fibrillation) [facility])   [facility] Jerrol Banana., MD      Future Appointments            In 1 month McGowan, Gordan Payment [place]   In 5 months Ralene Bathe, MD [place]           . triamterene-hydrochlorothiazide (MAXZIDE-25) 37.5-25 MG tablet [Pharmacy Med Name: TRIAMTERENE/HYDROCHLOROTHIAZIDE 37.5-25 MG Tablet] 45 tablet 0    Sig: TAKE 1/2 TABLET EVERY DAY     Cardiovascular: Diuretic Combos Failed - 07/10/2021 10:09 AM      Failed - Last BP in normal range    BP Readings from Last 1 Encounters:  05/01/21 (!) 149/83         Passed - K in normal range and within 360 days    Potassium  Date Value Ref Range  Status  07/25/2020 4.5 3.5 - 5.2 mmol/L Final         Passed - Na in normal range and within 360 days    Sodium  Date Value Ref Range Status  07/25/2020 141 134 - 144 mmol/L Final         Passed - Cr in normal range and within 360 days    Creatinine, Ser  Date Value Ref Range Status  07/25/2020 1.10 0.76 - 1.27 mg/dL Final         Passed - Ca in normal range and within 360 days    Calcium  Date Value Ref Range Status  07/25/2020 9.0 8.6 - 10.2 mg/dL Final         Passed - Valid encounter within last 6 months    Recent Outpatient Visits          2 months ago Essential hypertension   [facility] Jerrol Banana., MD   6 months ago Chronic atrial fibrillation [facility])   [facility]  Brooke Bonito., MD   11 months ago Annual physical exam   [facility] Jerrol Banana., MD   1 year ago AF (paroxysmal atrial fibrillation) [facility])   [facility] Jerrol Banana., MD   1 year ago AF (paroxysmal atrial fibrillation) [facility])   [facility] Jerrol Banana., MD      Future Appointments            In 1 month McGowan, Gordan Payment [place]   In 5 months Ralene Bathe, MD [place]

## 2021-07-31 ENCOUNTER — Ambulatory Visit: Payer: Medicare HMO

## 2021-07-31 ENCOUNTER — Other Ambulatory Visit: Payer: Self-pay

## 2021-07-31 DIAGNOSIS — I482 Chronic atrial fibrillation, unspecified: Secondary | ICD-10-CM

## 2021-07-31 NOTE — Patient Instructions (Signed)
Description   Continue 5mg  daily.  Recheck in four weeks.

## 2021-08-19 ENCOUNTER — Other Ambulatory Visit: Payer: Self-pay | Admitting: Family Medicine

## 2021-08-20 NOTE — Telephone Encounter (Signed)
Requested medication (s) are due for refill today: yes  Requested medication (s) are on the active medication list: yes  Last refill:  03/12/21 #90 with 1 refill  Future visit scheduled: yes  Notes to clinic:  Please review for refill. Refill not delegated per protocol.     Requested Prescriptions  Pending Prescriptions Disp Refills   warfarin (COUMADIN) 5 MG tablet [Pharmacy Med Name: WARFARIN SODIUM 5 MG Tablet] 90 tablet 1    Sig: TAKE 1 TABLET EVERY DAY  AT  6PM     Hematology:  Anticoagulants - warfarin Failed - 08/19/2021  9:04 AM      Failed - This refill cannot be delegated      Failed - If the patient is managed by Coumadin Clinic - route to their Pool. If not, forward to the provider.      Failed - INR in normal range and within 30 days    INR  Date Value Ref Range Status  07/03/2021 3.0 2.0 - 3.0 Final  07/08/2017 1.8 (H)  Final    Comment:    Reference Range                     0.9-1.1 Moderate-intensity Warfarin Therapy 2.0-3.0 Higher-intensity Warfarin Therapy   3.0-4.0  .           Failed - Valid encounter within last 3 months    Recent Outpatient Visits           3 months ago Essential hypertension   [facility] Jerrol Banana., MD   7 months ago Chronic atrial fibrillation [facility])   [facility] Jerrol Banana., MD   1 year ago Annual physical exam   [facility] Jerrol Banana., MD   1 year ago AF (paroxysmal atrial fibrillation) [facility])   [facility] Jerrol Banana., MD   1 year ago AF (paroxysmal atrial fibrillation) [facility])   [facility] Jerrol Banana., MD       Future Appointments             In 2 weeks McGowan, Gordan Payment [place]   In 4 months Ralene Bathe, MD [place]

## 2021-08-28 ENCOUNTER — Ambulatory Visit (INDEPENDENT_AMBULATORY_CARE_PROVIDER_SITE_OTHER): Payer: Medicare HMO

## 2021-08-28 ENCOUNTER — Other Ambulatory Visit: Payer: Self-pay

## 2021-08-28 DIAGNOSIS — E782 Mixed hyperlipidemia: Secondary | ICD-10-CM | POA: Diagnosis not present

## 2021-08-28 DIAGNOSIS — I480 Paroxysmal atrial fibrillation: Secondary | ICD-10-CM | POA: Diagnosis not present

## 2021-08-28 DIAGNOSIS — N189 Chronic kidney disease, unspecified: Secondary | ICD-10-CM | POA: Diagnosis not present

## 2021-08-28 DIAGNOSIS — I10 Essential (primary) hypertension: Secondary | ICD-10-CM | POA: Diagnosis not present

## 2021-08-28 LAB — POCT INR
INR: 3.2 — AB (ref 2.0–3.0)
PT: 38.8

## 2021-08-28 NOTE — Patient Instructions (Signed)
Description   Hold for 2 days then continue 5mg  daily.  Recheck in 3 weeks.

## 2021-08-29 LAB — CBC WITH DIFFERENTIAL/PLATELET
Basophils Absolute: 0.1 10*3/uL (ref 0.0–0.2)
Basos: 1 %
EOS (ABSOLUTE): 0.2 10*3/uL (ref 0.0–0.4)
Eos: 3 %
Hematocrit: 36.8 % — ABNORMAL LOW (ref 37.5–51.0)
Hemoglobin: 13.1 g/dL (ref 13.0–17.7)
Immature Grans (Abs): 0 10*3/uL (ref 0.0–0.1)
Immature Granulocytes: 0 %
Lymphocytes Absolute: 1.3 10*3/uL (ref 0.7–3.1)
Lymphs: 20 %
MCH: 32.7 pg (ref 26.6–33.0)
MCHC: 35.6 g/dL (ref 31.5–35.7)
MCV: 92 fL (ref 79–97)
Monocytes Absolute: 0.6 10*3/uL (ref 0.1–0.9)
Monocytes: 10 %
Neutrophils Absolute: 4.2 10*3/uL (ref 1.4–7.0)
Neutrophils: 66 %
Platelets: 144 10*3/uL — ABNORMAL LOW (ref 150–450)
RBC: 4.01 x10E6/uL — ABNORMAL LOW (ref 4.14–5.80)
RDW: 12.6 % (ref 11.6–15.4)
WBC: 6.3 10*3/uL (ref 3.4–10.8)

## 2021-08-29 LAB — LIPID PANEL
Chol/HDL Ratio: 2.2 ratio (ref 0.0–5.0)
Cholesterol, Total: 172 mg/dL (ref 100–199)
HDL: 77 mg/dL (ref >39)
LDL Chol Calc (NIH): 85 mg/dL (ref 0–99)
Triglycerides: 51 mg/dL (ref 0–149)
VLDL Cholesterol Cal: 10 mg/dL (ref 5–40)

## 2021-08-29 LAB — COMPREHENSIVE METABOLIC PANEL
ALT: 15 IU/L (ref 0–44)
AST: 20 IU/L (ref 0–40)
Albumin/Globulin Ratio: 2.2 (ref 1.2–2.2)
Albumin: 3.9 g/dL (ref 3.6–4.6)
Alkaline Phosphatase: 78 IU/L (ref 44–121)
BUN/Creatinine Ratio: 16 (ref 10–24)
BUN: 17 mg/dL (ref 8–27)
Bilirubin Total: 1 mg/dL (ref 0.0–1.2)
CO2: 24 mmol/L (ref 20–29)
Calcium: 9.1 mg/dL (ref 8.6–10.2)
Chloride: 107 mmol/L — ABNORMAL HIGH (ref 96–106)
Creatinine, Ser: 1.05 mg/dL (ref 0.76–1.27)
Globulin, Total: 1.8 g/dL (ref 1.5–4.5)
Glucose: 104 mg/dL — ABNORMAL HIGH (ref 70–99)
Potassium: 4.1 mmol/L (ref 3.5–5.2)
Sodium: 145 mmol/L — ABNORMAL HIGH (ref 134–144)
Total Protein: 5.7 g/dL — ABNORMAL LOW (ref 6.0–8.5)
eGFR: 70 mL/min/{1.73_m2} (ref >59)

## 2021-08-29 LAB — TSH: TSH: 2.39 u[IU]/mL (ref 0.450–4.500)

## 2021-09-05 NOTE — Progress Notes (Signed)
09/06/2021 7:01 PM   Casey Gallegos 07-08-1935 [id number]  Referring provider: Jerrol Banana., MD [address] [place],  [place] [address]  Chief Complaint  Patient presents with   Benign Prostatic Hypertrophy   Urological history 1. High risk hematuria - Former smoker - Incidental microscopic hematuria visit 09/1016 (3-10 RBC) with associated dysuria which has resolved.  Urine cytology 09/1016 negative - declined work up - no reports of gross heme - UA today negative for micro heme   2. Elevated PSA - negative prostate biopsy in 05/2011 by Dr. Bernardo Heater - PSA trend  12.1 in 2015  5.5 in 2016  3.7 in 2021  3. BPH with LU TS - I PSS: 7/2 - taking doxazosin 8 mg and finasteride 5 mg daily  4. Incomplete bladder emptying - PVR's ~150 mL   HPI: Casey Gallegos is a 87 y.o.  male who presents today for a year follow up.    He was seen a year ago for medication refill and recheck.  His urinary complaint at that visit was an occasional nocturia x1.  I PSS 8/2.  PVR 195 mL.  Today, he has his baseline nocturia x 1.     His UA is negative for micro heme.   PVR is 192 mL.     IPSS     Row Name 09/06/21 0800         International Prostate Symptom Score   How often have you had the sensation of not emptying your bladder? Less than 1 in 5     How often have you had to urinate less than every two hours? Less than 1 in 5 times     How often have you found you stopped and started again several times when you urinated? Less than 1 in 5 times     How often have you found it difficult to postpone urination? Less than 1 in 5 times     How often have you had a weak urinary stream? Less than 1 in 5 times     How often have you had to strain to start urination? Less than 1 in 5 times     How many times did you typically get up at night to urinate? 1 Time     Total IPSS Score 7       Quality of Life due to urinary symptoms   If you were to spend the rest of your  life with your urinary condition just the way it is now how would you feel about that? Mostly Satisfied               Score:  1-7 Mild 8-19 Moderate 20-35 Severe     PMH: Past Medical History:  Diagnosis Date   A-fib ([place])    Actinic keratosis    Anemia    DVT (deep venous thrombosis) (HCC)    Monitor with PT/INR. Target 2.0 - 3.0    Elevated PSA    H/O degenerative disc disease    Hypertension    Lipoma of arm    Lipomatosis    Night sweats    Obesity     Surgical History: Past Surgical History:  Procedure Laterality Date   Bilateral Radical Keratotomy Bilateral 1997   COLONOSCOPY WITH PROPOFOL N/A 01/11/2016   Procedure: COLONOSCOPY WITH PROPOFOL;  Surgeon: Manya Silvas, MD;  [facility]: [facility] ENDOSCOPY;  Service: Endoscopy;  Laterality: N/A; repeat 3 years, tubular adenoma  deep vein thrombosis     DENTAL SURGERY     Patient had implants   GUM SURGERY     L4 - S1 decompression Left    Left-sided   PROSTATE BIOPSY     TONSILLECTOMY AND ADENOIDECTOMY  age 58    Home Medications:  Allergies as of 09/06/2021       Reactions   Cefdinir Nausea Only   hypersensitivity to smell   Doxycycline    Sun sensitivity   Prednisone    Sertraline Other (See Comments)   Hallucinations         Medication List        Accurate as of September 06, 2021 11:59 PM. If you have any questions, ask your nurse or doctor.          amLODipine 5 MG tablet Commonly known as: NORVASC TAKE 1 TABLET EVERY DAY   benazepril 40 MG tablet Commonly known as: LOTENSIN TAKE 1 TABLET EVERY DAY   carvedilol 6.25 MG tablet Commonly known as: COREG TAKE 1 TABLET TWICE DAILY   Cortizone-10 Diabetics Skin 1 % lotion Generic drug: hydrocortisone Apply 1 application topically as needed.   doxazosin 8 MG tablet Commonly known as: CARDURA TAKE 1 TABLET EVERY DAY   finasteride 5 MG tablet Commonly known as: PROSCAR TAKE 1 TABLET EVERY DAY   gabapentin 300 MG  capsule Commonly known as: NEURONTIN TAKE 1 CAPSULE EVERY MORNING, 1 CAPSULE IN THE AFTERNOON AND 2 CAPSULES AT BEDTIME   Magnesium 500 MG Tabs Take by mouth daily.   MULTIPLE VITAMIN PO Take by mouth daily.   Multi-Vitamin tablet Take 1 tablet by mouth daily.   oxyCODONE 5 MG immediate release tablet Commonly known as: Oxy IR/ROXICODONE Take 1 tablet (5 mg total) by mouth every 4 (four) hours as needed for severe pain.   triamterene-hydrochlorothiazide 37.5-25 MG tablet Commonly known as: MAXZIDE-25 TAKE 1/2 TABLET EVERY DAY   warfarin 1 MG tablet Commonly known as: COUMADIN Take as directed by the anticoagulation clinic. If you are unsure how to take this medication, talk to your nurse or doctor. Original instructions: Take by mouth.   warfarin 4 MG tablet Commonly known as: COUMADIN Take as directed by the anticoagulation clinic. If you are unsure how to take this medication, talk to your nurse or doctor. Original instructions: TAKE 1 TABLET EVERY OTHER DAY AT 6PM ALTERNATING WITH 5MG    warfarin 5 MG tablet Commonly known as: COUMADIN Take as directed by the anticoagulation clinic. If you are unsure how to take this medication, talk to your nurse or doctor. Original instructions: TAKE 1 TABLET EVERY DAY  AT  6PM        Allergies:  Allergies  Allergen Reactions   Cefdinir Nausea Only    hypersensitivity to smell   Doxycycline     Sun sensitivity   Prednisone    Sertraline Other (See Comments)    Hallucinations     Family History: Family History  Problem Relation Age of Onset   Cancer Mother        secondary to cancinoma of the jaw from her dipping snuff.   Heart attack Father    CAD Father    Hypertension Sister    Diabetes Son        Type I diabetes   Prostate cancer Neg Hx    Kidney cancer Neg Hx     Social History:  reports that he quit smoking about 50 years ago. His smoking use included cigarettes.  He has never used smokeless tobacco. He  reports that he does not currently use alcohol. He reports that he does not use drugs.  Pertinent ROS in HPI  Physical Exam: BP (!) 142/71    Pulse (!) 56    Ht 5\' 9"  (1.753 m)    Wt 172 lb (78 kg)    BMI 25.40 kg/m   Constitutional:  Well nourished. Alert and oriented, No acute distress. HEENT: [place] AT, mask in place.  Trachea midline Cardiovascular: No clubbing, cyanosis, or edema. Respiratory: Normal respiratory effort, no increased work of breathing. Neurologic: Grossly intact, no focal deficits, moving all 4 extremities. Psychiatric: Normal mood and affect.   Laboratory Data: Lab Results  Component Value Date   WBC 6.3 08/28/2021   HGB 13.1 08/28/2021   HCT 36.8 (L) 08/28/2021   MCV 92 08/28/2021   PLT 144 (L) 08/28/2021    Lab Results  Component Value Date   CREATININE 1.05 08/28/2021   Component     Latest Ref Rng & Units 08/28/2021  TSH     0.450 - 4.500 uIU/mL 2.390   Component     Latest Ref Rng & Units 08/28/2021  Cholesterol, Total     100 - 199 mg/dL 172  Triglycerides     0 - 149 mg/dL 51  HDL Cholesterol     >39 mg/dL 77  VLDL Cholesterol Cal     5 - 40 mg/dL 10  LDL Chol Calc (NIH)     0 - 99 mg/dL 85  Total CHOL/HDL Ratio     0.0 - 5.0 ratio 2.2    Urinalysis Results for orders placed or performed in visit on 09/06/21  Microscopic Examination   Urine  Result Value Ref Range   WBC, UA 0-5 0 - 5 /hpf   RBC 0-2 0 - 2 /hpf   Epithelial Cells (non renal) 0-10 0 - 10 /hpf   Crystals Present N/A   Crystal Type Amorphous Sediment N/A   Bacteria, UA None seen None seen/Few  Urinalysis, Complete  Result Value Ref Range   Specific Gravity, UA 1.015 1.005 - 1.030   pH, UA 7.5 5.0 - 7.5   Color, UA Yellow Yellow   Appearance Ur Cloudy (A) Clear   Leukocytes,UA Negative Negative   Protein,UA Negative Negative/Trace   Glucose, UA Negative Negative   Ketones, UA Negative Negative   RBC, UA Negative Negative   Bilirubin, UA Negative Negative    Urobilinogen, Ur 1.0 0.2 - 1.0 mg/dL   Nitrite, UA Negative Negative   Microscopic Examination See below:   Bladder Scan (Post Void Residual) in office  Result Value Ref Range   Scan Result 114mL     I have reviewed the labs.  Pertinent Imaging  09/06/21 08:29  Scan Result 166mL     Assessment & Plan:    1. BPH with LUTS -aged out of PSA screening --UA benign -PVR < 300 cc -symptoms - well-controlled on finasteride/doxazosin -continue conservative management, avoiding bladder irritants and timed voiding's -Continue doxazosin 8 mg daily and finasteride 5 mg daily indefinitely-refills given  2. Incomplete bladder emptying -Serum creatinine 1.05 -No issues with urinary tract infection or retention episodes over the last year -Reviewed return precautions -PVR's continue to be moderate, so we will continue with annual monitoring  3. High risk hematuria -incidental micro heme 2018 - cytology negative -declined formal workup  -no reports of gross heme -UA negative for micro heme -continue with annual urinalysis  Return in  about 1 year (around 09/06/2022) for UA, I PSS and PVR .  Zara Council, PA-C  [facility] [address], [place], [place] [address] [phone]

## 2021-09-06 ENCOUNTER — Ambulatory Visit: Payer: Medicare HMO | Admitting: Urology

## 2021-09-06 ENCOUNTER — Other Ambulatory Visit: Payer: Self-pay

## 2021-09-06 ENCOUNTER — Encounter: Payer: Self-pay | Admitting: Urology

## 2021-09-06 VITALS — BP 142/71 | HR 56 | Ht 69.0 in | Wt 172.0 lb

## 2021-09-06 DIAGNOSIS — R319 Hematuria, unspecified: Secondary | ICD-10-CM

## 2021-09-06 DIAGNOSIS — R339 Retention of urine, unspecified: Secondary | ICD-10-CM

## 2021-09-06 DIAGNOSIS — N138 Other obstructive and reflux uropathy: Secondary | ICD-10-CM | POA: Diagnosis not present

## 2021-09-06 DIAGNOSIS — N401 Enlarged prostate with lower urinary tract symptoms: Secondary | ICD-10-CM

## 2021-09-06 LAB — MICROSCOPIC EXAMINATION: Bacteria, UA: NONE SEEN

## 2021-09-06 LAB — URINALYSIS, COMPLETE
Bilirubin, UA: NEGATIVE
Glucose, UA: NEGATIVE
Ketones, UA: NEGATIVE
Leukocytes,UA: NEGATIVE
Nitrite, UA: NEGATIVE
Protein,UA: NEGATIVE
RBC, UA: NEGATIVE
Specific Gravity, UA: 1.015 (ref 1.005–1.030)
Urobilinogen, Ur: 1 mg/dL (ref 0.2–1.0)
pH, UA: 7.5 (ref 5.0–7.5)

## 2021-09-06 LAB — BLADDER SCAN AMB NON-IMAGING

## 2021-09-10 ENCOUNTER — Ambulatory Visit (INDEPENDENT_AMBULATORY_CARE_PROVIDER_SITE_OTHER): Payer: Medicare HMO | Admitting: Family Medicine

## 2021-09-10 ENCOUNTER — Encounter: Payer: Medicare HMO | Admitting: Family Medicine

## 2021-09-10 ENCOUNTER — Other Ambulatory Visit: Payer: Self-pay

## 2021-09-10 ENCOUNTER — Encounter: Payer: Self-pay | Admitting: Family Medicine

## 2021-09-10 VITALS — BP 130/62 | HR 63 | Temp 98.3°F | Resp 18 | Ht 69.0 in | Wt 178.0 lb

## 2021-09-10 DIAGNOSIS — R972 Elevated prostate specific antigen [PSA]: Secondary | ICD-10-CM

## 2021-09-10 DIAGNOSIS — E782 Mixed hyperlipidemia: Secondary | ICD-10-CM | POA: Diagnosis not present

## 2021-09-10 DIAGNOSIS — I480 Paroxysmal atrial fibrillation: Secondary | ICD-10-CM

## 2021-09-10 DIAGNOSIS — I10 Essential (primary) hypertension: Secondary | ICD-10-CM

## 2021-09-10 DIAGNOSIS — M5441 Lumbago with sciatica, right side: Secondary | ICD-10-CM

## 2021-09-10 DIAGNOSIS — M5136 Other intervertebral disc degeneration, lumbar region: Secondary | ICD-10-CM

## 2021-09-10 DIAGNOSIS — I890 Lymphedema, not elsewhere classified: Secondary | ICD-10-CM | POA: Diagnosis not present

## 2021-09-10 DIAGNOSIS — Z0000 Encounter for general adult medical examination without abnormal findings: Secondary | ICD-10-CM | POA: Diagnosis not present

## 2021-09-10 DIAGNOSIS — T148XXA Other injury of unspecified body region, initial encounter: Secondary | ICD-10-CM | POA: Diagnosis not present

## 2021-09-10 DIAGNOSIS — M5442 Lumbago with sciatica, left side: Secondary | ICD-10-CM

## 2021-09-10 DIAGNOSIS — I429 Cardiomyopathy, unspecified: Secondary | ICD-10-CM

## 2021-09-10 DIAGNOSIS — G8929 Other chronic pain: Secondary | ICD-10-CM

## 2021-09-10 DIAGNOSIS — I872 Venous insufficiency (chronic) (peripheral): Secondary | ICD-10-CM | POA: Diagnosis not present

## 2021-09-10 MED ORDER — OXYCODONE HCL 5 MG PO TABS: ORAL_TABLET | ORAL | 0 refills | Status: DC

## 2021-09-10 NOTE — Assessment & Plan Note (Signed)
Continue to follow with PMR. PDMP reviewed and appropriate. Oxycodone refilled today.

## 2021-09-10 NOTE — Assessment & Plan Note (Addendum)
Doing well on current regimen, no changes made today. Labs reviewed

## 2021-09-10 NOTE — Assessment & Plan Note (Signed)
Labs at goal. Continue to monitor.

## 2021-09-10 NOTE — Assessment & Plan Note (Addendum)
Work to control BP, lipids. Labs reviewed. Doing well on current regimen, no changes made today. Continue to follow with cardiology.

## 2021-09-10 NOTE — Progress Notes (Signed)
BP 130/62 (BP Location: Right Arm, Patient Position: Sitting, Cuff Size: Large)    Pulse 63    Temp 98.3 F (36.8 C) (Temporal)    Resp 18    Ht 5\' 9"  (1.753 m)    Wt 178 lb (80.7 kg)    SpO2 99%    BMI 26.29 kg/m    Subjective:    Patient ID: Casey Gallegos, male    DOB: [birth year], 87 y.o.   MRN: [id number]  HPI: Casey Gallegos is a 87 y.o. male presenting on 09/10/2021 for comprehensive medical examination. Current medical complaints include: pulled muscle  Hypertension, afib, cardiomyopathy: - follows with Cardiology - Medications: amlodipine, coreg, benazepril, maxzide, warfarin - Compliance: good - Denies any SOB, CP, vision changes, LE edema, medication SEs, or symptoms of hypotension  Coumadin Management.  The expected duration of coumadin treatment is lifelong The reason for anticoagulation is  A. Fib.  Present Coumadin dose: 5 mg daily Goal:  2.0-3.0 Excessive bruising: no Nose bleeding: no Rectal bleeding: no Prolonged menstrual cycles: N/A Eating diet with consistent amounts of foods containing Vitamin K:yes Any recent antibiotic use? no   Benign Prostatic Hypertrophy, Hematuria, elevated PSA - follows with urology, last appt 1/13. UA negative for blood. - negative prostate bx 2012. Last PSA 3.7 2021, down from 5.5, 5.5. - Medications: doxazosin, finasteride - Denies: weak stream gross hematuria  DDD - with chronic LBP. S/p lumbar spine surgery 2011. follows with PMR. On oxycodone, gabapentin. Getting ESI.   Pulled muscle - son recently had leg amputated from diabetes complication. Was helping to lift wheelchair about a week ago and felt sharp pain in RLQ/groin. Has not felt bulge. No trouble with Bms. Putting muscle rub cream on it, oxycodone with relief.   Depression Screen done today and results listed below:  Depression screen [facility] 2/9 09/10/2021 05/29/2020 03/16/2019 03/10/2018 01/13/2017  Decreased Interest 1 0 0 2 1  Down, Depressed, Hopeless 1 0 0 0 1  PHQ - 2  Score 2 0 0 2 2  Altered sleeping 0 - - 1 1  Tired, decreased energy 0 - - 1 1  Change in appetite 0 - - 0 0  Feeling bad or failure about yourself  0 - - 1 0  Trouble concentrating 0 - - 0 1  Moving slowly or fidgety/restless 0 - - 0 0  Suicidal thoughts 0 - - 0 0  PHQ-9 Score 2 - - 5 5  Difficult doing work/chores Not difficult at all - - Not difficult at all Not difficult at all    The patient does not have a history of falls. I did not complete a risk assessment for falls. A plan of care for falls was not documented.   Past Medical History:  Past Medical History:  Diagnosis Date   A-fib ([place])    Actinic keratosis    Anemia    DVT (deep venous thrombosis) (HCC)    Monitor with PT/INR. Target 2.0 - 3.0    Elevated PSA    H/O degenerative disc disease    Hypertension    Lipoma of arm    Lipomatosis    Night sweats    Obesity     Surgical History:  Past Surgical History:  Procedure Laterality Date   Bilateral Radical Keratotomy Bilateral 1997   COLONOSCOPY WITH PROPOFOL N/A 01/11/2016   Procedure: COLONOSCOPY WITH PROPOFOL;  Surgeon: Manya Silvas, MD;  Location: [facility] ENDOSCOPY;  Service: Endoscopy;  Laterality: N/A;  repeat 3 years, tubular adenoma   deep vein thrombosis     DENTAL SURGERY     Patient had implants   GUM SURGERY     L4 - S1 decompression Left    Left-sided   PROSTATE BIOPSY     TONSILLECTOMY AND ADENOIDECTOMY  age 40    Medications:  Current Outpatient Medications on File Prior to Visit  Medication Sig   amLODipine (NORVASC) 5 MG tablet TAKE 1 TABLET EVERY DAY   benazepril (LOTENSIN) 40 MG tablet TAKE 1 TABLET EVERY DAY   carvedilol (COREG) 6.25 MG tablet TAKE 1 TABLET TWICE DAILY   CORTIZONE-10 DIABETICS SKIN 1 % lotion Apply 1 application topically as needed.    doxazosin (CARDURA) 8 MG tablet TAKE 1 TABLET EVERY DAY   finasteride (PROSCAR) 5 MG tablet TAKE 1 TABLET EVERY DAY   gabapentin (NEURONTIN) 300 MG capsule TAKE 1 CAPSULE EVERY  MORNING, 1 CAPSULE IN THE AFTERNOON AND 2 CAPSULES AT BEDTIME   Magnesium 500 MG TABS Take by mouth daily.    Multiple Vitamin (MULTI-VITAMIN) tablet Take 1 tablet by mouth daily.    MULTIPLE VITAMIN PO Take by mouth daily.    triamterene-hydrochlorothiazide (MAXZIDE-25) 37.5-25 MG tablet TAKE 1/2 TABLET EVERY DAY   warfarin (COUMADIN) 1 MG tablet Take by mouth.    warfarin (COUMADIN) 4 MG tablet TAKE 1 TABLET EVERY OTHER DAY AT 6PM ALTERNATING WITH 5MG    warfarin (COUMADIN) 5 MG tablet TAKE 1 TABLET EVERY DAY  AT  6PM   No current facility-administered medications on file prior to visit.    Allergies:  Allergies  Allergen Reactions   Cefdinir Nausea Only    hypersensitivity to smell   Doxycycline     Sun sensitivity   Prednisone    Sertraline Other (See Comments)    Hallucinations     Social History:  Social History   Socioeconomic History   Marital status: Married    Spouse name: Not on file   Number of children: 3   Years of education: Not on file   Highest education level: Associate degree: occupational, Hotel manager, or vocational program  Occupational History   Occupation: retired  Tobacco Use   Smoking status: Former    Types: Cigarettes    Quit date: 08/26/1971    Years since quitting: 50.0   Smokeless tobacco: Never   Tobacco comments:    Smoked for about 20 years.  Vaping Use   Vaping Use: Never used  Substance and Sexual Activity   Alcohol use: Not Currently   Drug use: No   Sexual activity: Not on file  Other Topics Concern   Not on file  Social History Narrative   Not on file   Social Determinants of Health   Financial Resource Strain: Not on file  Food Insecurity: Not on file  Transportation Needs: Not on file  Physical Activity: Not on file  Stress: Not on file  Social Connections: Not on file  Intimate Partner Violence: Not on file   Social History   Tobacco Use  Smoking Status Former   Types: Cigarettes   Quit date: 08/26/1971   Years  since quitting: 50.0  Smokeless Tobacco Never  Tobacco Comments   Smoked for about 20 years.   Social History   Substance and Sexual Activity  Alcohol Use Not Currently    Family History:  Family History  Problem Relation Age of Onset   Cancer Mother        secondary to cancinoma  of the jaw from her dipping snuff.   Heart attack Father    CAD Father    Hypertension Sister    Diabetes Son        Type I diabetes   Prostate cancer Neg Hx    Kidney cancer Neg Hx     Past medical history, surgical history, medications, allergies, family history and social history reviewed with patient today and changes made to appropriate areas of the chart.      Objective:    BP 130/62 (BP Location: Right Arm, Patient Position: Sitting, Cuff Size: Large)    Pulse 63    Temp 98.3 F (36.8 C) (Temporal)    Resp 18    Ht 5\' 9"  (1.753 m)    Wt 178 lb (80.7 kg)    SpO2 99%    BMI 26.29 kg/m   Wt Readings from Last 3 Encounters:  09/10/21 178 lb (80.7 kg)  09/06/21 172 lb (78 kg)  05/01/21 180 lb (81.6 kg)    Physical Exam Vitals reviewed.  Constitutional:      Appearance: Normal appearance.  HENT:     Head: Normocephalic.     Right Ear: External ear normal.     Left Ear: External ear normal.     Nose: Nose normal.     Mouth/Throat:     Mouth: Mucous membranes are moist.     Pharynx: Oropharynx is clear.  Eyes:     Extraocular Movements: Extraocular movements intact.  Cardiovascular:     Rate and Rhythm: Normal rate.     Comments: Irregularly irregular rhythm Pulmonary:     Effort: Pulmonary effort is normal. No respiratory distress.     Breath sounds: Normal breath sounds.  Abdominal:     General: Bowel sounds are normal. There is no distension.     Palpations: Abdomen is soft. There is no mass.     Tenderness: There is no guarding or rebound.     Hernia: No hernia is present.     Comments: Slighlty TTP in RLQ. No redness, swelling, asymmetry.  Musculoskeletal:        General:  Normal range of motion.     Comments: Chronic LLE edema, no worse.   Skin:    General: Skin is warm and dry.  Neurological:     Mental Status: He is oriented to person, place, and time. Mental status is at baseline.     Gait: Gait normal.  Psychiatric:        Mood and Affect: Mood normal.    Results for orders placed or performed in visit on 09/06/21  Microscopic Examination   Urine  Result Value Ref Range   WBC, UA 0-5 0 - 5 /hpf   RBC 0-2 0 - 2 /hpf   Epithelial Cells (non renal) 0-10 0 - 10 /hpf   Crystals Present N/A   Crystal Type Amorphous Sediment N/A   Bacteria, UA None seen None seen/Few  Urinalysis, Complete  Result Value Ref Range   Specific Gravity, UA 1.015 1.005 - 1.030   pH, UA 7.5 5.0 - 7.5   Color, UA Yellow Yellow   Appearance Ur Cloudy (A) Clear   Leukocytes,UA Negative Negative   Protein,UA Negative Negative/Trace   Glucose, UA Negative Negative   Ketones, UA Negative Negative   RBC, UA Negative Negative   Bilirubin, UA Negative Negative   Urobilinogen, Ur 1.0 0.2 - 1.0 mg/dL   Nitrite, UA Negative Negative   Microscopic Examination See below:  Bladder Scan (Post Void Residual) in office  Result Value Ref Range   Scan Result 148mL       Assessment & Plan:   Problem List Items Addressed This Visit       Cardiovascular and Mediastinum   A-fib ([place])   AF (paroxysmal atrial fibrillation) ([place]) - Primary    In a fib today. Continue coumadin, recent INR reviewed. F/u next month for repeat INR.       Cardiomyopathy ([place])    Work to control BP, lipids. Labs reviewed. Doing well on current regimen, no changes made today. Continue to follow with cardiology.      Chronic venous insufficiency   Essential (primary) hypertension    Doing well on current regimen, no changes made today. Labs reviewed        Musculoskeletal and Integument   Muscle strain    No red flags on history or exam. No hernia. Continue supportive care. Return and emergency  precautions discussed.         Other   Abnormal prostate specific antigen    Continue to follow with urology.      Back pain, chronic    Continue to follow with PMR. PDMP reviewed and appropriate. Oxycodone refilled today.      Relevant Medications   oxyCODONE (OXY IR/ROXICODONE) 5 MG immediate release tablet   Combined fat and carbohydrate induced hyperlipemia    Labs at goal. Continue to monitor.      Lymphedema    Stable. Continue to monitor.      Other Visit Diagnoses     DDD (degenerative disc disease), lumbar       Relevant Medications   oxyCODONE (OXY IR/ROXICODONE) 5 MG immediate release tablet        LABORATORY TESTING:  Health maintenance labs ordered today as discussed above.   IMMUNIZATIONS:   - Tdap: Tetanus vaccination status reviewed: due. - Influenza: Given elsewhere - Pneumococcal: Up to date - HPV: Not applicable - Shingrix vaccine: Refused - COVID vaccine: has received 3 doses of mRNA vaccine  SCREENING: - Colonoscopy: Not applicable  Discussed with patient purpose of the colonoscopy is to detect colon cancer at curable precancerous or early stages   Tobacco use: former smoker, 20 pack years, quit smoking >15 years ago - AAA Screening:  eligible for screening, wants to discuss with Cardiologist first.   - Lung cancer screening: n/a  Hep C Screening: due STD testing and prevention (HIV/chl/gon/syphilis): no concerns Sexual History: Incontinence Symptoms: none  PATIENT COUNSELING:    Advanced Care Planning: A voluntary discussion about advance care planning including the explanation and discussion of advance directives.  Discussed health care proxy and Living will, and the patient was able to identify a health care proxy as wife, Trevion Hoben.  Patient does have a living will at present time. If patient does have living will, I have requested they bring this to the clinic to be scanned in to their chart.  Sexuality: Discussed sexually  transmitted diseases, partner selection, use of condoms, avoidance of unintended pregnancy  and contraceptive alternatives.   Advised to avoid cigarette smoking.  I discussed with the patient that most people either abstain from alcohol or drink within safe limits (<=14/week and <=4 drinks/occasion for males, <=7/weeks and <= 3 drinks/occasion for females) and that the risk for alcohol disorders and other health effects rises proportionally with the number of drinks per week and how often a drinker exceeds daily limits.  Discussed cessation/primary prevention of drug  use and availability of treatment for abuse.   Diet: Encouraged to adjust caloric intake to maintain  or achieve ideal body weight, to reduce intake of dietary saturated fat and total fat, to limit sodium intake by avoiding high sodium foods and not adding table salt, and to maintain adequate dietary potassium and calcium preferably from fresh fruits, vegetables, and low-fat dairy products.    stressed the importance of regular exercise  Injury prevention: Discussed safety belts, safety helmets, smoke detector, smoking near bedding or upholstery.   Dental health: Discussed importance of regular tooth brushing, flossing, and dental visits.   Follow up plan: NEXT PREVENTATIVE PHYSICAL DUE IN 1 YEAR. Return in about 6 months (around 03/10/2022).

## 2021-09-10 NOTE — Assessment & Plan Note (Addendum)
In a fib today. Continue coumadin, recent INR reviewed. F/u next month for repeat INR.

## 2021-09-10 NOTE — Assessment & Plan Note (Signed)
Continue to follow with urology.

## 2021-09-10 NOTE — Assessment & Plan Note (Signed)
Stable.       - Continue to monitor

## 2021-09-10 NOTE — Assessment & Plan Note (Signed)
No red flags on history or exam. No hernia. Continue supportive care. Return and emergency precautions discussed.

## 2021-09-13 ENCOUNTER — Telehealth: Payer: Self-pay | Admitting: Family Medicine

## 2021-09-13 NOTE — Telephone Encounter (Signed)
Pharmacy calling to get orders to change the supply amount to 30 days because that all they can fill. Left reference number: [id number]

## 2021-09-13 NOTE — Telephone Encounter (Signed)
Returned call to [facility]. The directions for oxycodone were changed back to previous directions with quaintly of 90.

## 2021-09-25 ENCOUNTER — Ambulatory Visit (INDEPENDENT_AMBULATORY_CARE_PROVIDER_SITE_OTHER): Payer: Medicare HMO

## 2021-09-25 ENCOUNTER — Other Ambulatory Visit: Payer: Self-pay

## 2021-09-25 DIAGNOSIS — I480 Paroxysmal atrial fibrillation: Secondary | ICD-10-CM

## 2021-09-25 LAB — POCT INR
INR: 2.8 (ref 2.0–3.0)
PT: 33.6

## 2021-09-25 NOTE — Patient Instructions (Signed)
Description   Continue 5mg  daily.  Recheck in 4 weeks.

## 2021-10-07 ENCOUNTER — Encounter: Payer: Self-pay | Admitting: Family Medicine

## 2021-10-07 ENCOUNTER — Ambulatory Visit (INDEPENDENT_AMBULATORY_CARE_PROVIDER_SITE_OTHER): Payer: Medicare HMO | Admitting: Family Medicine

## 2021-10-07 ENCOUNTER — Other Ambulatory Visit: Payer: Self-pay

## 2021-10-07 VITALS — BP 134/77 | HR 69 | Temp 98.1°F | Resp 16 | Wt 176.2 lb

## 2021-10-07 DIAGNOSIS — R1909 Other intra-abdominal and pelvic swelling, mass and lump: Secondary | ICD-10-CM

## 2021-10-07 DIAGNOSIS — K4090 Unilateral inguinal hernia, without obstruction or gangrene, not specified as recurrent: Secondary | ICD-10-CM | POA: Diagnosis not present

## 2021-10-07 NOTE — Progress Notes (Signed)
° °  SUBJECTIVE:   CHIEF COMPLAINT / HPI:   BULGE Duration: months. Initially felt sharp pain after helping lift wheelchair ~ a month ago. Initially no bulge but now feeling bulge with associated pain/discomfort, worse with standing/walking. Location:  RLQ  groin Painful:  yes with bulging Discomfort: yes, more after walking Bulge: yes Context: bigger Aggravating factors: coughing No trouble with BM.   OBJECTIVE:   BP 134/77 (BP Location: Right Arm, Patient Position: Sitting, Cuff Size: Normal)    Pulse 69    Temp 98.1 F (36.7 C) (Oral)    Resp 16    Wt 176 lb 3.2 oz (79.9 kg)    SpO2 99%    BMI 26.02 kg/m   Gen: well appearing, in NAD Abd: soft, slightly TTP in R groin with associated bulge.  Ext: WWP, no edema  Limited US of R groin: no subcutaneous swelling/edema of R groin. No abdominal contents/bowel appreciated   ASSESSMENT/PLAN:   Inguinal bulge Palpated on exam, reducible with lying. No abdominal contents/bowel appreciated on bedside US, will obtain formal imaging and refer to surgery. No red flags to warrant emergent presentation.     Myles Gip, DO

## 2021-10-07 NOTE — Assessment & Plan Note (Signed)
Palpated on exam, reducible with lying. No abdominal contents/bowel appreciated on bedside US, will obtain formal imaging and refer to surgery. No red flags to warrant emergent presentation.

## 2021-10-14 ENCOUNTER — Other Ambulatory Visit: Payer: Self-pay | Admitting: Family Medicine

## 2021-10-14 ENCOUNTER — Ambulatory Visit
Admission: RE | Admit: 2021-10-14 | Discharge: 2021-10-14 | Disposition: A | Discharge status: Home / Self Care | Payer: Medicare HMO | Source: Ambulatory Visit | Attending: Family Medicine | Admitting: Family Medicine

## 2021-10-14 ENCOUNTER — Other Ambulatory Visit: Payer: Self-pay

## 2021-10-14 DIAGNOSIS — R1909 Other intra-abdominal and pelvic swelling, mass and lump: Secondary | ICD-10-CM

## 2021-10-14 DIAGNOSIS — K4090 Unilateral inguinal hernia, without obstruction or gangrene, not specified as recurrent: Secondary | ICD-10-CM | POA: Diagnosis not present

## 2021-10-15 ENCOUNTER — Encounter: Payer: Self-pay | Admitting: Surgery

## 2021-10-15 ENCOUNTER — Ambulatory Visit: Payer: Medicare HMO | Admitting: Surgery

## 2021-10-15 VITALS — BP 161/81 | HR 77 | Temp 98.2°F | Ht 69.0 in | Wt 170.6 lb

## 2021-10-15 DIAGNOSIS — K4090 Unilateral inguinal hernia, without obstruction or gangrene, not specified as recurrent: Secondary | ICD-10-CM

## 2021-10-15 NOTE — Patient Instructions (Addendum)
You have chose to have your hernia repaired. This will be done by Dr. Christian Mate at [facility].  Your last dose of Coumadin would be Wednesday February 22nd for surgery on Monday February 27th.   Please see your (blue) Pre-care information that you have been given today. Our surgery scheduler will call you to verify surgery date and to go over information.   You will need to arrange to be out of work for 2 weeks and then return with a lifting restrictions for 4 more weeks. Please send any FMLA paperwork prior to surgery and we will fill this out and fax it back to your employer within 3 business days.  You may have a bruise in your groin and also swelling and brusing in your testicle area. You may use ice 4-5 times daily for 15-20 minutes each time. Make sure that you place a barrier between you and the ice pack. To decrease the swelling, you may roll up a bath towel and place it vertically in between your thighs with your testicles resting on the towel. You will want to keep this area elevated as much as possible for several days following surgery.     Inguinal Hernia, Adult Muscles help keep everything in the body in its proper place. But if a weak spot in the muscles develops, something can poke through. That is called a hernia. When this happens in the lower part of the belly (abdomen), it is called an inguinal hernia. (It takes its name from a part of the body in this region called the inguinal canal.) A weak spot in the wall of muscles lets some fat or part of the small intestine bulge through. An inguinal hernia can develop at any age. Men get them more often than women. CAUSES  In adults, an inguinal hernia develops over time. It can be triggered by: Suddenly straining the muscles of the lower abdomen. Lifting heavy objects. Straining to have a bowel movement. Difficult bowel movements (constipation) can lead to this. Constant coughing. This may be caused by smoking or lung disease. Being  overweight. Being pregnant. Working at a job that requires long periods of standing or heavy lifting. Having had an inguinal hernia before. One type can be an emergency situation. It is called a strangulated inguinal hernia. It develops if part of the small intestine slips through the weak spot and cannot get back into the abdomen. The blood supply can be cut off. If that happens, part of the intestine may die. This situation requires emergency surgery. SYMPTOMS  Often, a small inguinal hernia has no symptoms. It is found when a healthcare provider does a physical exam. Larger hernias usually have symptoms.  In adults, symptoms may include: A lump in the groin. This is easier to see when the person is standing. It might disappear when lying down. In men, a lump in the scrotum. Pain or burning in the groin. This occurs especially when lifting, straining or coughing. A dull ache or feeling of pressure in the groin. Signs of a strangulated hernia can include: A bulge in the groin that becomes very painful and tender to the touch. A bulge that turns red or purple. Fever, nausea and vomiting. Inability to have a bowel movement or to pass gas. DIAGNOSIS  To decide if you have an inguinal hernia, a healthcare provider will probably do a physical examination. This will include asking questions about any symptoms you have noticed. The healthcare provider might feel the groin area and ask  you to cough. If an inguinal hernia is felt, the healthcare provider may try to slide it back into the abdomen. Usually no other tests are needed. TREATMENT  Treatments can vary. The size of the hernia makes a difference. Options include: Watchful waiting. This is often suggested if the hernia is small and you have had no symptoms. No medical procedure will be done unless symptoms develop. You will need to watch closely for symptoms. If any occur, contact your healthcare provider right away. Surgery. This is used  if the hernia is larger or you have symptoms. Open surgery. This is usually an outpatient procedure (you will not stay overnight in a hospital). An cut (incision) is made through the skin in the groin. The hernia is put back inside the abdomen. The weak area in the muscles is then repaired by herniorrhaphy or hernioplasty. Herniorrhaphy: in this type of surgery, the weak muscles are sewn back together. Hernioplasty: a patch or mesh is used to close the weak area in the abdominal wall. Laparoscopy. In this procedure, a surgeon makes small incisions. A thin tube with a tiny video camera (called a laparoscope) is put into the abdomen. The surgeon repairs the hernia with mesh by looking with the video camera and using two long instruments. HOME CARE INSTRUCTIONS  After surgery to repair an inguinal hernia: You will need to take pain medicine prescribed by your healthcare provider. Follow all directions carefully. You will need to take care of the wound from the incision. Your activity will be restricted for awhile. This will probably include no heavy lifting for several weeks. You also should not do anything too active for a few weeks. When you can return to work will depend on the type of job that you have. During "watchful waiting" periods, you should: Maintain a healthy weight. Eat a diet high in fiber (fruits, vegetables and whole grains). Drink plenty of fluids to avoid constipation. This means drinking enough water and other liquids to keep your urine clear or pale yellow. Do not lift heavy objects. Do not stand for long periods of time. Quit smoking. This should keep you from developing a frequent cough. SEEK MEDICAL CARE IF:  A bulge develops in your groin area. You feel pain, a burning sensation or pressure in the groin. This might be worse if you are lifting or straining. You develop a fever of more than 100.5 F (38.1 C). SEEK IMMEDIATE MEDICAL CARE IF:  Pain in the groin increases  suddenly. A bulge in the groin gets bigger suddenly and does not go down. For men, there is sudden pain in the scrotum. Or, the size of the scrotum increases. A bulge in the groin area becomes red or purple and is painful to touch. You have nausea or vomiting that does not go away. You feel your heart beating much faster than normal. You cannot have a bowel movement or pass gas. You develop a fever of more than 102.0 F (38.9 C).   This information is not intended to replace advice given to you by your health care provider. Make sure you discuss any questions you have with your health care provider.   Document Released: 12/28/2008 Document Revised: 11/03/2011 Document Reviewed: 02/12/2015 Elsevier Interactive Patient Education [employer].

## 2021-10-15 NOTE — H&P (View-Only) (Signed)
Patient ID: Casey Gallegos, male   DOB: 20-Nov-1934, 87 y.o.   MRN: [id number]  Chief Complaint: Right inguinal hernia  History of Present Illness Casey Gallegos is a 87 y.o. male with a known bulge in the right groin for about 6 weeks.  He reports it spontaneously goes down at night.  He reports pain primarily when it is protruding.  Denies any bowel issues.  Noted an exacerbation when he attempted to lift/control his son's wheelchair.  Pain has been significantly worse since that time.  He currently takes Coumadin for history of DVT and was ready to come off of it when he had a diagnosis of intermittent atrial fibrillation.  He reports he has stopped his Coumadin for multiple other less significant procedures.  Past Medical History Past Medical History:  Diagnosis Date   A-fib ([place])    Actinic keratosis    Anemia    DVT (deep venous thrombosis) ([facility])    Monitor with PT/INR. Target 2.0 - 3.0    Elevated PSA    H/O degenerative disc disease    Hypertension    Lipoma of arm    Lipomatosis    Night sweats    Obesity       Past Surgical History:  Procedure Laterality Date   Bilateral Radical Keratotomy Bilateral 1997   COLONOSCOPY WITH PROPOFOL N/A 01/11/2016   Procedure: COLONOSCOPY WITH PROPOFOL;  Surgeon: Manya Silvas, MD;  Location: [facility] ENDOSCOPY;  Service: Endoscopy;  Laterality: N/A; repeat 3 years, tubular adenoma   deep vein thrombosis     DENTAL SURGERY     Patient had implants   GUM SURGERY     L4 - S1 decompression Left    Left-sided   PROSTATE BIOPSY     TONSILLECTOMY AND ADENOIDECTOMY  age 26    Allergies  Allergen Reactions   Cefdinir Nausea Only    hypersensitivity to smell   Doxycycline     Sun sensitivity   Prednisone    Sertraline Other (See Comments)    Hallucinations     Current Outpatient Medications  Medication Sig Dispense Refill   amLODipine (NORVASC) 5 MG tablet TAKE 1 TABLET EVERY DAY 90 tablet 3   benazepril (LOTENSIN) 40 MG tablet TAKE 1  TABLET EVERY DAY 90 tablet 0   carvedilol (COREG) 6.25 MG tablet TAKE 1 TABLET TWICE DAILY 180 tablet 3   CORTIZONE-10 DIABETICS SKIN 1 % lotion Apply 1 application topically as needed.      doxazosin (CARDURA) 8 MG tablet TAKE 1 TABLET EVERY DAY 90 tablet 3   finasteride (PROSCAR) 5 MG tablet TAKE 1 TABLET EVERY DAY 90 tablet 3   gabapentin (NEURONTIN) 300 MG capsule TAKE 1 CAPSULE EVERY MORNING, 1 CAPSULE IN THE AFTERNOON AND 2 CAPSULES AT BEDTIME 360 capsule 3   Magnesium 500 MG TABS Take by mouth daily.      Multiple Vitamin (MULTI-VITAMIN) tablet Take 1 tablet by mouth daily.      MULTIPLE VITAMIN PO Take by mouth daily.      oxyCODONE (OXY IR/ROXICODONE) 5 MG immediate release tablet 1/2 pill to 1 pill daily as needed for severe pain. 90 tablet 0   triamterene-hydrochlorothiazide (MAXZIDE-25) 37.5-25 MG tablet TAKE 1/2 TABLET EVERY DAY 45 tablet 0   warfarin (COUMADIN) 5 MG tablet TAKE 1 TABLET EVERY DAY  AT  6PM 90 tablet 1   warfarin (COUMADIN) 1 MG tablet Take by mouth.  (Patient not taking: Reported on 10/15/2021)  warfarin (COUMADIN) 4 MG tablet TAKE 1 TABLET EVERY OTHER DAY AT 6PM ALTERNATING WITH 5MG  (Patient not taking: Reported on 10/15/2021) 45 tablet 2   No current facility-administered medications for this visit.    Family History Family History  Problem Relation Age of Onset   Cancer Mother        secondary to cancinoma of the jaw from her dipping snuff.   Heart attack Father    CAD Father    Hypertension Sister    Diabetes Son        Type I diabetes   Prostate cancer Neg Hx    Kidney cancer Neg Hx       Social History Social History   Tobacco Use   Smoking status: Former    Types: Cigarettes    Quit date: 08/26/1971    Years since quitting: 50.1    Passive exposure: Never   Smokeless tobacco: Never   Tobacco comments:    Smoked for about 20 years.  Vaping Use   Vaping Use: Never used  Substance Use Topics   Alcohol use: Not Currently   Drug use: No      Review of Systems  Constitutional: Negative.   HENT:  Positive for hearing loss.   Eyes: Negative.   Respiratory: Negative.    Cardiovascular:  Positive for leg swelling.  Gastrointestinal: Negative.   Genitourinary: Negative.   Skin: Negative.   Neurological: Negative.   Psychiatric/Behavioral: Negative.       Physical Exam Blood pressure (!) 161/81, pulse 77, temperature 98.2 F (36.8 C), height 5\' 9"  (1.753 m), weight 170 lb 9.6 oz (77.4 kg), SpO2 98 %. Last Weight  Most recent update: 10/15/2021  4:07 PM    Weight  77.4 kg (170 lb 9.6 oz)             CONSTITUTIONAL: Well developed, and nourished, appropriately responsive and aware without distress.   EYES: Sclera non-icteric.   EARS, NOSE, MOUTH AND THROAT: Mask worn.   The oropharynx is clear. Oral mucosa is pink and moist.    Hearing is intact to voice.  NECK: Trachea is midline, and there is no jugular venous distension.  LYMPH NODES:  Lymph nodes in the neck are not enlarged. RESPIRATORY:  Lungs are clear, and breath sounds are equal bilaterally. Normal respiratory effort without pathologic use of accessory muscles. CARDIOVASCULAR: Heart is regular in rate and rhythm. GI: The abdomen is soft, nontender, and nondistended. There were no palpable masses. I did not appreciate hepatosplenomegaly.  GU: Right groin bulge palpable, readily reducible.  Right inguinal hernia readily reproducible with Valsalva.  No appreciable bulge in the left side. MUSCULOSKELETAL:  Symmetrical muscle tone appreciated in all four extremities.    SKIN: Skin turgor is normal. No pathologic skin lesions appreciated.  NEUROLOGIC:  Motor and sensation appear grossly normal.  Cranial nerves are grossly without defect. PSYCH:  Alert and oriented to person, place and time. Affect is appropriate for situation.  Data Reviewed I have personally reviewed what is currently available of the patient's imaging, recent labs and medical records.   Labs:   CBC Latest Ref Rng & Units 08/28/2021 07/25/2020 01/04/2020  WBC 3.4 - 10.8 x10E3/uL 6.3 6.4 4.8  Hemoglobin 13.0 - 17.7 g/dL 13.1 13.3 12.5(L)  Hematocrit 37.5 - 51.0 % 36.8(L) 38.1 36.6(L)  Platelets 150 - 450 x10E3/uL 144(L) 168 140(L)   CMP Latest Ref Rng & Units 08/28/2021 07/25/2020 01/04/2020  Glucose 70 - 99 mg/dL 104(H) 72 105(H)  BUN 8 - 27 mg/dL 17 21 24   Creatinine 0.76 - 1.27 mg/dL 1.05 1.10 1.09  Sodium 134 - 144 mmol/L 145(H) 141 143  Potassium 3.5 - 5.2 mmol/L 4.1 4.5 4.1  Chloride 96 - 106 mmol/L 107(H) 107(H) 109(H)  CO2 20 - 29 mmol/L 24 24 22   Calcium 8.6 - 10.2 mg/dL 9.1 9.0 8.7  Total Protein 6.0 - 8.5 g/dL 5.7(L) 6.5 5.8(L)  Total Bilirubin 0.0 - 1.2 mg/dL 1.0 1.4(H) 0.8  Alkaline Phos 44 - 121 IU/L 78 88 68  AST 0 - 40 IU/L 20 19 21   ALT 0 - 44 IU/L 15 15 17       Imaging: Radiology review:  The ultrasound of the right groin was reviewed, confirming the presence of a right inguinal hernia. Within last 24 hrs: No results found.  Assessment    Right inguinal hernia. 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/2015   Back pain, chronic 02/28/2015   Sex counseling 02/28/2015   Narrowing of intervertebral disc space 02/28/2015   Acute thromboembolism of deep veins of lower extremity ([place]) 02/28/2015   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  He desires to have repair soon as possible and we have scheduled him currently for February 27 at his request.  Therefore his last dose of Coumadin will be tomorrow. Robotic right inguinal hernia repair with mesh.  I discussed possibility of incarceration, strangulation, enlargement in size over time, and the need for emergency surgery in the face of these.  Also reviewed the techniques of reduction should incarceration occur, and when unsuccessful to present to the ED.  Also discussed that surgery risks include recurrence which can be up to 30% in the case of complex hernias, use of prosthetic materials (mesh) and the increased risk of infection and the possible need for re-operation and removal of mesh, possibility of post-op SBO or ileus, and the risks of general anesthetic including heart attack, stroke, sudden death or some reaction to anesthetic medications. The patient, and those present, appear to understand the risks, any and all questions were answered to the patient's satisfaction.  No guarantees were ever expressed or implied.   Face-to-face time spent with the patient and accompanying care providers(if present) was 45 minutes, with more than 50% of the time spent counseling, educating, and coordinating care of the patient.    These notes generated with voice recognition software. I apologize for typographical errors.  Ronny Bacon M.D., FACS 10/15/2021, 4:19 PM

## 2021-10-15 NOTE — Progress Notes (Signed)
Patient ID: Casey Gallegos, male   DOB: [birth year], 87 y.o.   MRN: [id number]  Chief Complaint: Right inguinal hernia  History of Present Illness Casey Gallegos is a 87 y.o. male with a known bulge in the right groin for about 6 weeks.  He reports it spontaneously goes down at night.  He reports pain primarily when it is protruding.  Denies any bowel issues.  Noted an exacerbation when he attempted to lift/control his son's wheelchair.  Pain has been significantly worse since that time.  He currently takes Coumadin for history of DVT and was ready to come off of it when he had a diagnosis of intermittent atrial fibrillation.  He reports he has stopped his Coumadin for multiple other less significant procedures.  Past Medical History Past Medical History:  Diagnosis Date   A-fib ([place])    Actinic keratosis    Anemia    DVT (deep venous thrombosis) (HCC)    Monitor with PT/INR. Target 2.0 - 3.0    Elevated PSA    H/O degenerative disc disease    Hypertension    Lipoma of arm    Lipomatosis    Night sweats    Obesity       Past Surgical History:  Procedure Laterality Date   Bilateral Radical Keratotomy Bilateral 1997   COLONOSCOPY WITH PROPOFOL N/A 01/11/2016   Procedure: COLONOSCOPY WITH PROPOFOL;  Surgeon: Manya Silvas, MD;  Location: [facility] ENDOSCOPY;  Service: Endoscopy;  Laterality: N/A; repeat 3 years, tubular adenoma   deep vein thrombosis     DENTAL SURGERY     Patient had implants   GUM SURGERY     L4 - S1 decompression Left    Left-sided   PROSTATE BIOPSY     TONSILLECTOMY AND ADENOIDECTOMY  age 48    Allergies  Allergen Reactions   Cefdinir Nausea Only    hypersensitivity to smell   Doxycycline     Sun sensitivity   Prednisone    Sertraline Other (See Comments)    Hallucinations     Current Outpatient Medications  Medication Sig Dispense Refill   amLODipine (NORVASC) 5 MG tablet TAKE 1 TABLET EVERY DAY 90 tablet 3   benazepril (LOTENSIN) 40 MG tablet TAKE 1  TABLET EVERY DAY 90 tablet 0   carvedilol (COREG) 6.25 MG tablet TAKE 1 TABLET TWICE DAILY 180 tablet 3   CORTIZONE-10 DIABETICS SKIN 1 % lotion Apply 1 application topically as needed.      doxazosin (CARDURA) 8 MG tablet TAKE 1 TABLET EVERY DAY 90 tablet 3   finasteride (PROSCAR) 5 MG tablet TAKE 1 TABLET EVERY DAY 90 tablet 3   gabapentin (NEURONTIN) 300 MG capsule TAKE 1 CAPSULE EVERY MORNING, 1 CAPSULE IN THE AFTERNOON AND 2 CAPSULES AT BEDTIME 360 capsule 3   Magnesium 500 MG TABS Take by mouth daily.      Multiple Vitamin (MULTI-VITAMIN) tablet Take 1 tablet by mouth daily.      MULTIPLE VITAMIN PO Take by mouth daily.      oxyCODONE (OXY IR/ROXICODONE) 5 MG immediate release tablet 1/2 pill to 1 pill daily as needed for severe pain. 90 tablet 0   triamterene-hydrochlorothiazide (MAXZIDE-25) 37.5-25 MG tablet TAKE 1/2 TABLET EVERY DAY 45 tablet 0   warfarin (COUMADIN) 5 MG tablet TAKE 1 TABLET EVERY DAY  AT  6PM 90 tablet 1   warfarin (COUMADIN) 1 MG tablet Take by mouth.  (Patient not taking: Reported on 10/15/2021)  warfarin (COUMADIN) 4 MG tablet TAKE 1 TABLET EVERY OTHER DAY AT 6PM ALTERNATING WITH 5MG  (Patient not taking: Reported on 10/15/2021) 45 tablet 2   No current facility-administered medications for this visit.    Family History Family History  Problem Relation Age of Onset   Cancer Mother        secondary to cancinoma of the jaw from her dipping snuff.   Heart attack Father    CAD Father    Hypertension Sister    Diabetes Son        Type I diabetes   Prostate cancer Neg Hx    Kidney cancer Neg Hx       Social History Social History   Tobacco Use   Smoking status: Former    Types: Cigarettes    Quit date: 08/26/1971    Years since quitting: 50.1    Passive exposure: Never   Smokeless tobacco: Never   Tobacco comments:    Smoked for about 20 years.  Vaping Use   Vaping Use: Never used  Substance Use Topics   Alcohol use: Not Currently   Drug use: No      Review of Systems  Constitutional: Negative.   HENT:  Positive for hearing loss.   Eyes: Negative.   Respiratory: Negative.    Cardiovascular:  Positive for leg swelling.  Gastrointestinal: Negative.   Genitourinary: Negative.   Skin: Negative.   Neurological: Negative.   Psychiatric/Behavioral: Negative.       Physical Exam Blood pressure (!) 161/81, pulse 77, temperature 98.2 F (36.8 C), height 5\' 9"  (1.753 m), weight 170 lb 9.6 oz (77.4 kg), SpO2 98 %. Last Weight  Most recent update: 10/15/2021  4:07 PM    Weight  77.4 kg (170 lb 9.6 oz)             CONSTITUTIONAL: Well developed, and nourished, appropriately responsive and aware without distress.   EYES: Sclera non-icteric.   EARS, NOSE, MOUTH AND THROAT: Mask worn.   The oropharynx is clear. Oral mucosa is pink and moist.    Hearing is intact to voice.  NECK: Trachea is midline, and there is no jugular venous distension.  LYMPH NODES:  Lymph nodes in the neck are not enlarged. RESPIRATORY:  Lungs are clear, and breath sounds are equal bilaterally. Normal respiratory effort without pathologic use of accessory muscles. CARDIOVASCULAR: Heart is regular in rate and rhythm. GI: The abdomen is soft, nontender, and nondistended. There were no palpable masses. I did not appreciate hepatosplenomegaly.  GU: Right groin bulge palpable, readily reducible.  Right inguinal hernia readily reproducible with Valsalva.  No appreciable bulge in the left side. MUSCULOSKELETAL:  Symmetrical muscle tone appreciated in all four extremities.    SKIN: Skin turgor is normal. No pathologic skin lesions appreciated.  NEUROLOGIC:  Motor and sensation appear grossly normal.  Cranial nerves are grossly without defect. PSYCH:  Alert and oriented to person, place and time. Affect is appropriate for situation.  Data Reviewed I have personally reviewed what is currently available of the patient's imaging, recent labs and medical records.   Labs:   CBC Latest Ref Rng & Units 08/28/2021 07/25/2020 01/04/2020  WBC 3.4 - 10.8 x10E3/uL 6.3 6.4 4.8  Hemoglobin 13.0 - 17.7 g/dL 13.1 13.3 12.5(L)  Hematocrit 37.5 - 51.0 % 36.8(L) 38.1 36.6(L)  Platelets 150 - 450 x10E3/uL 144(L) 168 140(L)   CMP Latest Ref Rng & Units 08/28/2021 07/25/2020 01/04/2020  Glucose 70 - 99 mg/dL 104(H) 72 105(H)  BUN 8 - 27 mg/dL 17 21 24   Creatinine 0.76 - 1.27 mg/dL 1.05 1.10 1.09  Sodium 134 - 144 mmol/L 145(H) 141 143  Potassium 3.5 - 5.2 mmol/L 4.1 4.5 4.1  Chloride 96 - 106 mmol/L 107(H) 107(H) 109(H)  CO2 20 - 29 mmol/L 24 24 22   Calcium 8.6 - 10.2 mg/dL 9.1 9.0 8.7  Total Protein 6.0 - 8.5 g/dL 5.7(L) 6.5 5.8(L)  Total Bilirubin 0.0 - 1.2 mg/dL 1.0 1.4(H) 0.8  Alkaline Phos 44 - 121 IU/L 78 88 68  AST 0 - 40 IU/L 20 19 21   ALT 0 - 44 IU/L 15 15 17       Imaging: Radiology review:  The ultrasound of the right groin was reviewed, confirming the presence of a right inguinal hernia. Within last 24 hrs: No results found.  Assessment    Right inguinal hernia. Patient Active Problem List   Diagnosis Date Noted   Inguinal bulge 10/07/2021   Muscle strain 09/10/2021   Cardiomyopathy ([place]) 12/30/2018   Chronic venous insufficiency 11/11/2017   Lymphedema 11/11/2017   Pain in limb 10/27/2017   A-fib ([place]) 03/02/2015   Deep vein thrombosis ([place]) 03/02/2015   Essential (primary) hypertension 03/02/2015   Combined fat and carbohydrate induced hyperlipemia 03/02/2015   Heart valve disease 03/02/2015   Allergic rhinitis 02/28/2015   Absolute anemia 02/28/2015   Benign fibroma of prostate 02/28/2015   Back pain, chronic 02/28/2015   Sex counseling 02/28/2015   Narrowing of intervertebral disc space 02/28/2015   Acute thromboembolism of deep veins of lower extremity ([place]) 02/28/2015   ED (erectile dysfunction) of organic origin 02/28/2015   Abnormal prostate specific antigen 02/28/2015   BP (high blood pressure) 02/28/2015   Lipomatosis 02/28/2015    Malaise and fatigue 02/28/2015   Generalized hyperhidrosis 02/28/2015   Arthritis, degenerative 02/28/2015   Adiposity 02/28/2015   AF (paroxysmal atrial fibrillation) ([place]) 02/28/2015   Chronic prostatitis 02/28/2015   Spinal stenosis 02/28/2015   Atypical pneumonia 02/28/2015    Plan    We will confirm with cardiology that it is okay to take him off his Coumadin.  He desires to have repair soon as possible and we have scheduled him currently for February 27 at his request.  Therefore his last dose of Coumadin will be tomorrow. Robotic right inguinal hernia repair with mesh.  I discussed possibility of incarceration, strangulation, enlargement in size over time, and the need for emergency surgery in the face of these.  Also reviewed the techniques of reduction should incarceration occur, and when unsuccessful to present to the ED.  Also discussed that surgery risks include recurrence which can be up to 30% in the case of complex hernias, use of prosthetic materials (mesh) and the increased risk of infection and the possible need for re-operation and removal of mesh, possibility of post-op SBO or ileus, and the risks of general anesthetic including heart attack, stroke, sudden death or some reaction to anesthetic medications. The patient, and those present, appear to understand the risks, any and all questions were answered to the patient's satisfaction.  No guarantees were ever expressed or implied.   Face-to-face time spent with the patient and accompanying care providers(if present) was 45 minutes, with more than 50% of the time spent counseling, educating, and coordinating care of the patient.    These notes generated with voice recognition software. I apologize for typographical errors.  Ronny Bacon M.D., FACS 10/15/2021, 4:19 PM

## 2021-10-16 ENCOUNTER — Ambulatory Visit: Payer: Self-pay | Admitting: Surgery

## 2021-10-16 ENCOUNTER — Telehealth: Payer: Self-pay | Admitting: Surgery

## 2021-10-16 DIAGNOSIS — K4090 Unilateral inguinal hernia, without obstruction or gangrene, not specified as recurrent: Secondary | ICD-10-CM

## 2021-10-16 NOTE — Telephone Encounter (Signed)
Patient has been advised of Pre-Admission date/time, COVID Testing date and Surgery date.  Surgery Date: 10/21/21 Preadmission Testing Date: 10/18/21 (phone 1p-5p) Covid Testing Date: Not needed.    Patient has been made aware to call [phone], between 1-3:00pm the day before surgery, to find out what time to arrive for surgery.

## 2021-10-17 NOTE — Progress Notes (Unsigned)
Cardiology clearance received from Dr Alveria Apley office. The patient is cleared at Low risk. He may stop his Warfarin for 4 days prior to surgery.

## 2021-10-18 ENCOUNTER — Other Ambulatory Visit: Payer: Self-pay

## 2021-10-18 ENCOUNTER — Encounter
Admission: RE | Admit: 2021-10-18 | Discharge: 2021-10-18 | Disposition: A | Discharge status: Home / Self Care | Payer: Medicare HMO | Source: Ambulatory Visit | Attending: Surgery | Admitting: Surgery

## 2021-10-18 ENCOUNTER — Encounter: Payer: Self-pay | Admitting: Surgery

## 2021-10-18 ENCOUNTER — Other Ambulatory Visit
Admission: RE | Admit: 2021-10-18 | Discharge: 2021-10-18 | Disposition: A | Discharge status: Home / Self Care | Payer: Medicare HMO | Source: Ambulatory Visit | Attending: Surgery | Admitting: Surgery

## 2021-10-18 DIAGNOSIS — K4090 Unilateral inguinal hernia, without obstruction or gangrene, not specified as recurrent: Secondary | ICD-10-CM | POA: Diagnosis not present

## 2021-10-18 DIAGNOSIS — Z01812 Encounter for preprocedural laboratory examination: Secondary | ICD-10-CM | POA: Diagnosis not present

## 2021-10-18 LAB — CBC WITH DIFFERENTIAL/PLATELET
Abs Immature Granulocytes: 0.01 10*3/uL (ref 0.00–0.07)
Basophils Absolute: 0.1 10*3/uL (ref 0.0–0.1)
Basophils Relative: 1 %
Eosinophils Absolute: 0.2 10*3/uL (ref 0.0–0.5)
Eosinophils Relative: 3 %
HCT: 35.8 % — ABNORMAL LOW (ref 39.0–52.0)
Hemoglobin: 11.7 g/dL — ABNORMAL LOW (ref 13.0–17.0)
Immature Granulocytes: 0 %
Lymphocytes Relative: 24 %
Lymphs Abs: 1.3 10*3/uL (ref 0.7–4.0)
MCH: 31.5 pg (ref 26.0–34.0)
MCHC: 32.7 g/dL (ref 30.0–36.0)
MCV: 96.5 fL (ref 80.0–100.0)
Monocytes Absolute: 0.6 10*3/uL (ref 0.1–1.0)
Monocytes Relative: 11 %
Neutro Abs: 3.3 10*3/uL (ref 1.7–7.7)
Neutrophils Relative %: 61 %
Platelets: 149 10*3/uL — ABNORMAL LOW (ref 150–400)
RBC: 3.71 MIL/uL — ABNORMAL LOW (ref 4.22–5.81)
RDW: 14.1 % (ref 11.5–15.5)
WBC: 5.4 10*3/uL (ref 4.0–10.5)
nRBC: 0 % (ref 0.0–0.2)

## 2021-10-18 LAB — COMPREHENSIVE METABOLIC PANEL
ALT: 16 U/L (ref 0–44)
AST: 19 U/L (ref 15–41)
Albumin: 3.6 g/dL (ref 3.5–5.0)
Alkaline Phosphatase: 57 U/L (ref 38–126)
Anion gap: 4 — ABNORMAL LOW (ref 5–15)
BUN: 24 mg/dL — ABNORMAL HIGH (ref 8–23)
CO2: 26 mmol/L (ref 22–32)
Calcium: 8.5 mg/dL — ABNORMAL LOW (ref 8.9–10.3)
Chloride: 109 mmol/L (ref 98–111)
Creatinine, Ser: 1.05 mg/dL (ref 0.61–1.24)
GFR, Estimated: >60 mL/min (ref >60)
Glucose, Bld: 94 mg/dL (ref 70–99)
Potassium: 4.1 mmol/L (ref 3.5–5.1)
Sodium: 139 mmol/L (ref 135–145)
Total Bilirubin: 1.1 mg/dL (ref 0.3–1.2)
Total Protein: 6.3 g/dL — ABNORMAL LOW (ref 6.5–8.1)

## 2021-10-18 LAB — PROTIME-INR
INR: 2.5 — ABNORMAL HIGH (ref 0.8–1.2)
Prothrombin Time: 27.1 seconds — ABNORMAL HIGH (ref 11.4–15.2)

## 2021-10-18 NOTE — Progress Notes (Signed)
Perioperative Services  Pre-Admission/Anesthesia Testing Clinical Review  Date: 10/18/21  Patient Demographics:  Name: Casey Gallegos DOB:   07-May-1935 MRN:   [id number]  Planned Surgical Procedure(s):    Case: [id number] Date/Time: 10/21/21 1322   Procedure: XI ROBOTIC ASSISTED INGUINAL HERNIA REPAIR WITH MESH (Right)   Anesthesia type: General   Pre-op diagnosis: right inguinal hernia   Location: [facility] OR ROOM 06 / [facility]   Surgeons: Ronny Bacon, MD   NOTE: Available PAT nursing documentation and vital signs have been reviewed. Clinical nursing staff has updated patient's PMH/PSHx, current medication list, and drug allergies/intolerances to ensure comprehensive history available to assist in medical decision making as it pertains to the aforementioned surgical procedure and anticipated anesthetic course. Extensive review of available clinical information performed. [place] PMH and PSHx updated with any diagnoses/procedures that  may have been inadvertently omitted during his intake with the pre-admission testing department's nursing staff.  Clinical Discussion:  Casey Gallegos is a 87 y.o. male who is submitted for pre-surgical anesthesia review and clearance prior to him undergoing the above procedure. Patient is a Former Smoker (quit 08/1971). Pertinent PMH includes: atrial fibrillation, NICM, VHD, RBBB, DVT, HTN, HLD, DOE, anemia, OA, DDD, BPH.  Patient is followed by cardiology Nehemiah Massed, MD). He was last seen in the cardiology clinic on 06/27/2021; notes reviewed.  At the time of his clinic visit, patient doing well overall from a cardiovascular perspective.  He denied any chest pain, however patient had chronic exertional dyspnea that was reported to be at baseline.  Patient denied any PND, orthopnea, palpitations, significant peripheral edema, vertiginous symptoms, or presyncope/syncope.  PMH significant for cardiovascular diagnoses.  TTE performed on  11/22/2015 revealed a reduced left ventricular systolic function with an EF of 40%.  There was moderate biatrial enlargement and mild right ventricular enlargement.  Moderate mitral valve, moderate to severe tricuspid valve, and trivial pulmonary valve regurgitation observed.  There was no evidence of a significant transvalvular gradient to suggest stenosis.  Most recent TTE was performed on 08/30/2020 to follow-up on patient's and ICM diagnosis.  Left ventricular systolic function stable with an EF of 45-50%.  Additionally, patient with ADHD diagnosis.  Study revealed improvement in patient's valvular function revealing only mild mitral and tricuspid valve regurgitation.  Long-term cardiac event monitor study performed on 06/04/2021 revealed predominant underlying atrial fibrillation with occasional PVCs.  Evidence of sick sinus syndrome and asymptomatic bradycardia with long RR intervals of >3 seconds noted.  Discussed potential for PPM placement for sick sinus syndrome if/when patient becomes more symptomatic.  Patient has a history of atrial fibrillation; CHA2DS2-VASc Score = 5 (age x 2, HTN, DVT x 2).  Rate and rhythm maintained on oral carvedilol.  Patient is chronically anticoagulated using daily warfarin; compliant with therapy with no evidence or reports of GI bleeding.  Blood pressure well controlled at 128/60 on currently prescribed CCB, ACEi, beta-blocker, diuretic, and alpha-blocker therapies.  Patient is not currently on any type of lipid-lowering therapies for his HLD and ASCVD prevention.  Functional capacity limited by age and underlying cardiovascular diagnoses, however patient still felt to be able to achieve at least 4 METS of activity without angina/anginal equivalent symptoms.  No changes were made to his medication regimen.  Patient follow-up with outpatient cardiology in 9 months or sooner if needed.  [name] is scheduled for an elective RIGHT ROBOTIC ASSISTED INGUINAL HERNIA  REPAIR WITH MESH on 10/21/2021 with Dr. Ronny Bacon, MD.  Given patient's past medical history significant for cardiovascular diagnoses, presurgical cardiac clearance was sought by the performing surgeon's office and PAT team. Per cardiology, "this patient is optimized for surgery and may proceed with the planned procedural course with a LOW risk of significant perioperative cardiovascular complications".  Again, this patient is on daily anticoagulation therapy.  He has been cleared by his cardiologist to hold his warfarin for 4 days prior to his procedure with plans to restart as soon as postoperative bleeding risk felt to be minimized by his primary attending surgeon.  The patient is aware that his last dose of warfarin will be on 10/16/2021.  Per cardiology, patient will not require preoperative enoxaparin bridging for this procedure.  Patient denies previous perioperative complications with anesthesia in the past. In review of the available records, it is noted that patient underwent a general anesthetic course here (ASA II) in 12/2015 without documented complications.   Vitals with BMI 10/18/2021 10/15/2021 10/07/2021  Height 5\' 9"  5\' 9"  -  Weight 172 lbs 170 lbs 10 oz 176 lbs 3 oz  BMI 25.39 40.98 11.91  Systolic - 478 295  Diastolic - 81 77  Pulse - 77 69    Providers/Specialists:   NOTE: Primary physician provider listed below. Patient may have been seen by APP or partner within same practice.   PROVIDER ROLE / SPECIALTY LAST Katy Apo, MD General Surgery (Surgeon) 10/15/2021  Jerrol Banana., MD Primary Care Provider 10/07/2021  Serafina Royals, MD Cardiology 06/27/2021  Sharlet Salina, MD Physiatry 06/10/2021   Allergies:  Cefdinir, Doxycycline, Prednisone, and Sertraline  Current Home Medications:   No current facility-administered medications for this encounter.    amLODipine (NORVASC) 5 MG tablet   benazepril (LOTENSIN) 40 MG tablet   carvedilol  (COREG) 6.25 MG tablet   CORTIZONE-10 DIABETICS SKIN 1 % lotion   doxazosin (CARDURA) 8 MG tablet   finasteride (PROSCAR) 5 MG tablet   gabapentin (NEURONTIN) 300 MG capsule   Magnesium 500 MG TABS   Multiple Vitamin (MULTI-VITAMIN) tablet   oxyCODONE (OXY IR/ROXICODONE) 5 MG immediate release tablet   triamterene-hydrochlorothiazide (MAXZIDE-25) 37.5-25 MG tablet   warfarin (COUMADIN) 5 MG tablet   Cyanocobalamin (VITAMIN B 12 PO)   History:   Past Medical History:  Diagnosis Date   A-fib (HCC)    a.) CHA2DS2-VASc = 5 (age x2, HTN, DVT x2). b.) rate/rhythm maintained on oral carvedilol; chronically anticoagulated using daily warfarin.   Actinic keratosis    Anemia    BPH (benign prostatic hyperplasia)    DOE (dyspnea on exertion)    DVT (deep venous thrombosis) (HCC)    Elevated PSA    H/O degenerative disc disease    HLD (hyperlipidemia)    Hypertension    Lipoma of arm    Lipomatosis    Long term current use of anticoagulant    a.) warfarin   NICM (nonischemic cardiomyopathy) ([place]) 11/22/2015   a.) TTE 11/22/2015: EF 40%. b.) TTE 12/27/2018: EF 45%. c.) TTE 08/30/2020: EF 45-50%.   Night sweats    Obesity    Osteoarthritis    RBBB (right bundle branch block)    Right inguinal hernia    VHD (valvular heart disease) 11/22/2015   a.) TTE 11/22/2015: EF 40%; mod MR, mod-sev TR, triv PR; mod BAE, mild RV enlargement. b.) TTE 12/27/2018: EF 45%; mod MR, mod-sev TR, triv PR; mod BAE, mild RV enlargement. c.) TTE 08/30/2020: EF 45-50%; mild MR/TR.   Past Surgical History:  Procedure Laterality Date   Bilateral Radical Keratotomy Bilateral 1997   COLONOSCOPY WITH PROPOFOL N/A 01/11/2016   Procedure: COLONOSCOPY WITH PROPOFOL;  Surgeon: Manya Silvas, MD;  Location: [facility] ENDOSCOPY;  Service: Endoscopy;  Laterality: N/A; repeat 3 years, tubular adenoma   deep vein thrombosis     DENTAL SURGERY     Patient had implants   GUM SURGERY     L4 - S1 decompression Left     Left-sided   PROSTATE BIOPSY     TONSILLECTOMY AND ADENOIDECTOMY  age 4   Family History  Problem Relation Age of Onset   Cancer Mother        secondary to cancinoma of the jaw from her dipping snuff.   Heart attack Father    CAD Father    Hypertension Sister    Diabetes Son        Type I diabetes   Prostate cancer Neg Hx    Kidney cancer Neg Hx    Social History   Tobacco Use   Smoking status: Former    Types: Cigarettes    Quit date: 08/26/1971    Years since quitting: 50.1    Passive exposure: Never   Smokeless tobacco: Never   Tobacco comments:    Smoked for about 20 years.  Vaping Use   Vaping Use: Never used  Substance Use Topics   Alcohol use: Not Currently   Drug use: No    Pertinent Clinical Results:  LABS: Labs reviewed: Acceptable for surgery.  Hospital Outpatient Visit on 10/18/2021  Component Date Value Ref Range Status   WBC 10/18/2021 5.4  4.0 - 10.5 K/uL Final   RBC 10/18/2021 3.71 (L)  4.22 - 5.81 MIL/uL Final   Hemoglobin 10/18/2021 11.7 (L)  13.0 - 17.0 g/dL Final   HCT 10/18/2021 35.8 (L)  39.0 - 52.0 % Final   MCV 10/18/2021 96.5  80.0 - 100.0 fL Final   MCH 10/18/2021 31.5  26.0 - 34.0 pg Final   MCHC 10/18/2021 32.7  30.0 - 36.0 g/dL Final   RDW 10/18/2021 14.1  11.5 - 15.5 % Final   Platelets 10/18/2021 149 (L)  150 - 400 K/uL Final   nRBC 10/18/2021 0.0  0.0 - 0.2 % Final   Neutrophils Relative % 10/18/2021 61  % Final   Neutro Abs 10/18/2021 3.3  1.7 - 7.7 K/uL Final   Lymphocytes Relative 10/18/2021 24  % Final   Lymphs Abs 10/18/2021 1.3  0.7 - 4.0 K/uL Final   Monocytes Relative 10/18/2021 11  % Final   Monocytes Absolute 10/18/2021 0.6  0.1 - 1.0 K/uL Final   Eosinophils Relative 10/18/2021 3  % Final   Eosinophils Absolute 10/18/2021 0.2  0.0 - 0.5 K/uL Final   Basophils Relative 10/18/2021 1  % Final   Basophils Absolute 10/18/2021 0.1  0.0 - 0.1 K/uL Final   Immature Granulocytes 10/18/2021 0  % Final   Abs Immature  Granulocytes 10/18/2021 0.01  0.00 - 0.07 K/uL Final   Performed at [facility], [address] [place],  [address]   Sodium 10/18/2021 139  135 - 145 mmol/L Final   Potassium 10/18/2021 4.1  3.5 - 5.1 mmol/L Final   Chloride 10/18/2021 109  98 - 111 mmol/L Final   CO2 10/18/2021 26  22 - 32 mmol/L Final   Glucose, Bld 10/18/2021 94  70 - 99 mg/dL Final   Glucose reference range applies only to samples taken after fasting for at  least 8 hours.   BUN 10/18/2021 24 (H)  8 - 23 mg/dL Final   Creatinine, Ser 10/18/2021 1.05  0.61 - 1.24 mg/dL Final   Calcium 10/18/2021 8.5 (L)  8.9 - 10.3 mg/dL Final   Total Protein 10/18/2021 6.3 (L)  6.5 - 8.1 g/dL Final   Albumin 10/18/2021 3.6  3.5 - 5.0 g/dL Final   AST 10/18/2021 19  15 - 41 U/L Final   ALT 10/18/2021 16  0 - 44 U/L Final   Alkaline Phosphatase 10/18/2021 57  38 - 126 U/L Final   Total Bilirubin 10/18/2021 1.1  0.3 - 1.2 mg/dL Final   GFR, Estimated 10/18/2021 >60  >60 mL/min Final   Comment: (NOTE) Calculated using the CKD-EPI Creatinine Equation (2021)    Anion gap 10/18/2021 4 (L)  5 - 15 Final   Performed at [facility], [address] [place], [place] [address]   Prothrombin Time 10/18/2021 27.1 (H)  11.4 - 15.2 seconds Final   INR 10/18/2021 2.5 (H)  0.8 - 1.2 Final   Comment: (NOTE) INR goal varies based on device and disease states. Performed at [facility], [address] [place], [place] [address]     ECG: Date: 06/04/2021 Rate: 51 bpm Rhythm: atrial fibrillation; RBBB Axis (leads I and aVF): Left axis deviation Intervals: QRS 138 ms. QTc 449 ms. ST segment and T wave changes: No evidence of acute ST segment elevation or depression Comparison: Similar to previous tracing obtained on 07/27/2020 NOTE: Tracing obtained at [facility]; unable for review. Above based on cardiologist's interpretation.    IMAGING / PROCEDURES: LONG TERM CARDIAC EVENT MONITOR STUDY  performed on 06/04/2021 Predominant underlying atrial fibrillation with occasional PVCs.   Evidence of sick sinus syndrome and asymptomatic bradycardia with long RR intervals of >3 seconds noted.   Discussed potential for PPM placement for sick sinus syndrome if/when patient becomes more symptomatic.  TRANSTHORACIC ECHOCARDIOGRAM performed on 08/30/2020 LVEF 45-50% Mild left ventricular systolic dysfunction Normal right ventricular systolic function Mild MR and TR No AR or PR No valvular stenosis No pericardial effusion  Impression and Plan:  Casey Gallegos has been referred for pre-anesthesia review and clearance prior to him undergoing the planned anesthetic and procedural courses. Available labs, pertinent testing, and imaging results were personally reviewed by me. This patient has been appropriately cleared by cardiology with an overall LOW risk of significant perioperative cardiovascular complications.  Based on clinical review performed today (10/18/21), barring any significant acute changes in the patient's overall condition, it is anticipated that he will be able to proceed with the planned surgical intervention. Any acute changes in clinical condition may necessitate his procedure being postponed and/or cancelled. Patient will meet with anesthesia team (MD and/or CRNA) on the day of his procedure for preoperative evaluation/assessment. Questions regarding anesthetic course will be fielded at that time.   Pre-surgical instructions were reviewed with the patient during his PAT appointment and questions were fielded by PAT clinical staff. Patient was advised that if any questions or concerns arise prior to his procedure then he should return a call to PAT and/or his surgeon's office to discuss.  Honor Loh, MSN, APRN, FNP-C, CEN [facility]  Peri-operative Services Nurse Practitioner Phone: [phone] Fax: [phone] 10/18/21 12:48 PM  NOTE: This note has  been prepared using Dragon dictation software. Despite my best ability to proofread, there is always the potential that unintentional transcriptional errors may still occur from this process.

## 2021-10-18 NOTE — Patient Instructions (Addendum)
Your procedure is scheduled on: Monday October 21, 2021. Report to Day Surgery inside [place] 2nd floor, stop by admissions desk before getting on elevator.  To find out your arrival time please call [phone] between 1PM - 3PM on Friday October 18, 2021.  Remember: Instructions that are not followed completely may result in serious medical risk,  up to and including death, or upon the discretion of your surgeon and anesthesiologist your  surgery may need to be rescheduled.     _X__ 1. Do not eat food or drink fluids after midnight the night before your procedure.                 No chewing gum or hard candies.   __X__2.  On the morning of surgery brush your teeth with toothpaste and water, you                may rinse your mouth with mouthwash if you wish.  Do not swallow any toothpaste or mouthwash.     _X__ 3.  No Alcohol for 24 hours before or after surgery.   _X__ 4.  Do Not Smoke or use e-cigarettes For 24 Hours Prior to Your Surgery.                 Do not use any chewable tobacco products for at least 6 hours prior to                 Surgery.  _X__  5.  Do not use any recreational drugs (marijuana, cocaine, heroin, ecstasy, MDMA or other)                For at least one week prior to your surgery.  Combination of these drugs with anesthesia                May have life threatening results.  ____  6.  Bring all medications with you on the day of surgery if instructed.   __X__  7.  Notify your doctor if there is any change in your medical condition      (cold, fever, infections).     Do not wear jewelry, make-up, hairpins, clips or nail polish. Do not wear lotions, powders, or perfumes. You may wear deodorant. Do not shave 48 hours prior to surgery. Men may shave face and neck. Do not bring valuables to the hospital.    [facility] is not responsible for any belongings or valuables.  Contacts, dentures or bridgework may not be worn into  surgery. Leave your suitcase in the car. After surgery it may be brought to your room. For patients admitted to the hospital, discharge time is determined by your treatment team.   Patients discharged the day of surgery will not be allowed to drive home.   Make arrangements for someone to be with you for the first 24 hours of your Same Day Discharge.   __X__ Take these medicines the morning of surgery with A SIP OF WATER:    1. amLODipine (NORVASC) 5 MG  2. carvedilol (COREG) 6.25 MG  3. doxazosin (CARDURA) 8 MG  4. finasteride (PROSCAR) 5 MG   5.  6.  ____ Fleet Enema (as directed)   __X__ Use CHG Soap (or wipes) as directed  ____ Use Benzoyl Peroxide Gel as instructed  ____ Use inhalers on the day of surgery  ____ Stop metformin 2 days prior to surgery    ____ Take 1/2 of usual insulin dose  the night before surgery. No insulin the morning          of surgery.   __X__ Stop warfarin (COUMADIN) 5 MG 4 days before your surgery as instructed by your doctor.   __X__ One Week prior to surgery- Stop Anti-inflammatories such as Ibuprofen, Aleve, Advil, Motrin, meloxicam (MOBIC), diclofenac, etodolac, ketorolac, Toradol, Daypro, piroxicam, Goody's or BC powders. OK TO USE TYLENOL IF NEEDED   __X__ Stop supplements until after surgery.    ____ Bring C-Pap to the hospital.    If you have any questions regarding your pre-procedure instructions,  Please call Pre-admit Testing at [phone]

## 2021-10-21 ENCOUNTER — Other Ambulatory Visit: Payer: Self-pay

## 2021-10-21 ENCOUNTER — Ambulatory Visit: Payer: Medicare HMO | Admitting: Urgent Care

## 2021-10-21 ENCOUNTER — Encounter
Admission: RE | Disposition: A | Discharge status: Home / Self Care | Payer: Self-pay | Source: Home / Self Care | Attending: Surgery

## 2021-10-21 ENCOUNTER — Encounter: Payer: Self-pay | Admitting: Surgery

## 2021-10-21 ENCOUNTER — Ambulatory Visit
Admission: RE | Admit: 2021-10-21 | Discharge: 2021-10-21 | Disposition: A | Discharge status: Home / Self Care | Payer: Medicare HMO | Attending: Surgery | Admitting: Surgery

## 2021-10-21 DIAGNOSIS — Z87891 Personal history of nicotine dependence: Secondary | ICD-10-CM | POA: Diagnosis not present

## 2021-10-21 DIAGNOSIS — Z7901 Long term (current) use of anticoagulants: Secondary | ICD-10-CM | POA: Insufficient documentation

## 2021-10-21 DIAGNOSIS — I480 Paroxysmal atrial fibrillation: Secondary | ICD-10-CM | POA: Insufficient documentation

## 2021-10-21 DIAGNOSIS — K4090 Unilateral inguinal hernia, without obstruction or gangrene, not specified as recurrent: Secondary | ICD-10-CM | POA: Insufficient documentation

## 2021-10-21 DIAGNOSIS — I509 Heart failure, unspecified: Secondary | ICD-10-CM | POA: Insufficient documentation

## 2021-10-21 DIAGNOSIS — I110 Hypertensive heart disease with heart failure: Secondary | ICD-10-CM | POA: Diagnosis not present

## 2021-10-21 DIAGNOSIS — Z86718 Personal history of other venous thrombosis and embolism: Secondary | ICD-10-CM | POA: Diagnosis not present

## 2021-10-21 HISTORY — DX: Other forms of dyspnea: R06.09

## 2021-10-21 HISTORY — DX: Long term (current) use of anticoagulants: Z79.01

## 2021-10-21 HISTORY — DX: Hyperlipidemia, unspecified: E78.5

## 2021-10-21 HISTORY — DX: Unilateral inguinal hernia, without obstruction or gangrene, not specified as recurrent: K40.90

## 2021-10-21 HISTORY — DX: Unspecified osteoarthritis, unspecified site: M19.90

## 2021-10-21 HISTORY — DX: Unspecified right bundle-branch block: I45.10

## 2021-10-21 HISTORY — DX: Benign prostatic hyperplasia without lower urinary tract symptoms: N40.0

## 2021-10-21 HISTORY — PX: XI ROBOTIC ASSISTED INGUINAL HERNIA REPAIR WITH MESH: SHX6706

## 2021-10-21 SURGERY — REPAIR, HERNIA, INGUINAL, ROBOT-ASSISTED, LAPAROSCOPIC, USING MESH
Anesthesia: General | Laterality: Right

## 2021-10-21 MED ORDER — CHLORHEXIDINE GLUCONATE CLOTH 2 % EX PADS
6.0000 | MEDICATED_PAD | Freq: Once | CUTANEOUS | Status: AC
  Administered 2021-10-21: 6 via TOPICAL

## 2021-10-21 MED ORDER — SUGAMMADEX SODIUM 200 MG/2ML IV SOLN
INTRAVENOUS | Status: DC | PRN
  Administered 2021-10-21: 200 mg via INTRAVENOUS

## 2021-10-21 MED ORDER — CEFAZOLIN SODIUM-DEXTROSE 2-4 GM/100ML-% IV SOLN
2.0000 g | INTRAVENOUS | Status: AC
  Administered 2021-10-21: 2 g via INTRAVENOUS

## 2021-10-21 MED ORDER — LIDOCAINE HCL (CARDIAC) PF 100 MG/5ML IV SOSY
PREFILLED_SYRINGE | INTRAVENOUS | Status: DC | PRN
Start: 2021-10-21 — End: 2021-10-21
  Administered 2021-10-21: 80 mg via INTRAVENOUS

## 2021-10-21 MED ORDER — ONDANSETRON HCL 4 MG/2ML IJ SOLN
INTRAMUSCULAR | Status: DC | PRN
  Administered 2021-10-21: 4 mg via INTRAVENOUS

## 2021-10-21 MED ORDER — ORAL CARE MOUTH RINSE: 15.0000 mL | Freq: Once | OROMUCOSAL | Status: AC

## 2021-10-21 MED ORDER — BUPIVACAINE LIPOSOME 1.3 % IJ SUSP: 20.0000 mL | Freq: Once | INTRAMUSCULAR | Status: DC

## 2021-10-21 MED ORDER — CEFAZOLIN SODIUM-DEXTROSE 2-4 GM/100ML-% IV SOLN
INTRAVENOUS | Status: AC
  Filled 2021-10-21: qty 100

## 2021-10-21 MED ORDER — ACETAMINOPHEN 500 MG PO TABS: 1000.0000 mg | ORAL_TABLET | ORAL | Status: AC

## 2021-10-21 MED ORDER — OXYCODONE HCL 5 MG PO TABS: 5.0000 mg | ORAL_TABLET | Freq: Four times a day (QID) | ORAL | 0 refills | Status: DC | PRN

## 2021-10-21 MED ORDER — FAMOTIDINE 20 MG PO TABS
ORAL_TABLET | ORAL | Status: AC
  Administered 2021-10-21: 20 mg via ORAL
  Filled 2021-10-21: qty 1

## 2021-10-21 MED ORDER — DEXAMETHASONE SODIUM PHOSPHATE 10 MG/ML IJ SOLN
INTRAMUSCULAR | Status: DC | PRN
  Administered 2021-10-21: 10 mg via INTRAVENOUS

## 2021-10-21 MED ORDER — BUPIVACAINE-EPINEPHRINE (PF) 0.25% -1:200000 IJ SOLN
INTRAMUSCULAR | Status: AC
  Filled 2021-10-21: qty 30

## 2021-10-21 MED ORDER — BUPIVACAINE-EPINEPHRINE (PF) 0.25% -1:200000 IJ SOLN
INTRAMUSCULAR | Status: DC | PRN
  Administered 2021-10-21: 30 mL

## 2021-10-21 MED ORDER — LACTATED RINGERS IV SOLN: INTRAVENOUS | Status: DC

## 2021-10-21 MED ORDER — PROPOFOL 10 MG/ML IV BOLUS
INTRAVENOUS | Status: AC
  Filled 2021-10-21: qty 20

## 2021-10-21 MED ORDER — CELECOXIB 200 MG PO CAPS: 200.0000 mg | ORAL_CAPSULE | ORAL | Status: AC

## 2021-10-21 MED ORDER — OXYCODONE HCL 5 MG PO TABS
5.0000 mg | ORAL_TABLET | Freq: Once | ORAL | Status: AC | PRN
  Administered 2021-10-21: 5 mg via ORAL

## 2021-10-21 MED ORDER — DROPERIDOL 2.5 MG/ML IJ SOLN
0.6250 mg | Freq: Once | INTRAMUSCULAR | Status: DC | PRN
  Filled 2021-10-21: qty 2

## 2021-10-21 MED ORDER — CHLORHEXIDINE GLUCONATE 0.12 % MT SOLN: 15.0000 mL | Freq: Once | OROMUCOSAL | Status: AC

## 2021-10-21 MED ORDER — FAMOTIDINE 20 MG PO TABS: 20.0000 mg | ORAL_TABLET | Freq: Once | ORAL | Status: AC

## 2021-10-21 MED ORDER — PROPOFOL 10 MG/ML IV BOLUS
INTRAVENOUS | Status: DC | PRN
  Administered 2021-10-21: 130 mg via INTRAVENOUS

## 2021-10-21 MED ORDER — IBUPROFEN 800 MG PO TABS: 800.0000 mg | ORAL_TABLET | Freq: Three times a day (TID) | ORAL | 0 refills | Status: DC | PRN

## 2021-10-21 MED ORDER — FENTANYL CITRATE (PF) 100 MCG/2ML IJ SOLN
INTRAMUSCULAR | Status: AC
  Filled 2021-10-21: qty 2

## 2021-10-21 MED ORDER — GABAPENTIN 300 MG PO CAPS
ORAL_CAPSULE | ORAL | Status: AC
Start: 2021-10-21 — End: 2021-10-21
  Administered 2021-10-21: 300 mg via ORAL
  Filled 2021-10-21: qty 1

## 2021-10-21 MED ORDER — DEXAMETHASONE SODIUM PHOSPHATE 10 MG/ML IJ SOLN
INTRAMUSCULAR | Status: AC
  Filled 2021-10-21: qty 1

## 2021-10-21 MED ORDER — ACETAMINOPHEN 10 MG/ML IV SOLN: 1000.0000 mg | Freq: Once | INTRAVENOUS | Status: DC | PRN

## 2021-10-21 MED ORDER — OXYCODONE HCL 5 MG/5ML PO SOLN: 5.0000 mg | Freq: Once | ORAL | Status: AC | PRN

## 2021-10-21 MED ORDER — GABAPENTIN 300 MG PO CAPS: 300.0000 mg | ORAL_CAPSULE | ORAL | Status: AC

## 2021-10-21 MED ORDER — CELECOXIB 200 MG PO CAPS
ORAL_CAPSULE | ORAL | Status: AC
  Administered 2021-10-21: 200 mg via ORAL
  Filled 2021-10-21: qty 1

## 2021-10-21 MED ORDER — ROCURONIUM BROMIDE 100 MG/10ML IV SOLN
INTRAVENOUS | Status: DC | PRN
  Administered 2021-10-21: 50 mg via INTRAVENOUS
  Administered 2021-10-21: 20 mg via INTRAVENOUS

## 2021-10-21 MED ORDER — FENTANYL CITRATE (PF) 100 MCG/2ML IJ SOLN
INTRAMUSCULAR | Status: DC | PRN
  Administered 2021-10-21: 50 ug via INTRAVENOUS

## 2021-10-21 MED ORDER — ONDANSETRON HCL 4 MG/2ML IJ SOLN
INTRAMUSCULAR | Status: AC
  Filled 2021-10-21: qty 2

## 2021-10-21 MED ORDER — ACETAMINOPHEN 500 MG PO TABS
ORAL_TABLET | ORAL | Status: AC
  Administered 2021-10-21: 1000 mg via ORAL
  Filled 2021-10-21: qty 1

## 2021-10-21 MED ORDER — FENTANYL CITRATE (PF) 100 MCG/2ML IJ SOLN
25.0000 ug | INTRAMUSCULAR | Status: DC | PRN
  Administered 2021-10-21: 25 ug via INTRAVENOUS

## 2021-10-21 MED ORDER — FENTANYL CITRATE (PF) 100 MCG/2ML IJ SOLN
INTRAMUSCULAR | Status: AC
  Administered 2021-10-21: 25 ug via INTRAVENOUS
  Filled 2021-10-21: qty 2

## 2021-10-21 MED ORDER — EPHEDRINE SULFATE (PRESSORS) 50 MG/ML IJ SOLN
INTRAMUSCULAR | Status: DC | PRN
  Administered 2021-10-21: 5 mg via INTRAVENOUS

## 2021-10-21 MED ORDER — OXYCODONE HCL 5 MG PO TABS
ORAL_TABLET | ORAL | Status: AC
  Filled 2021-10-21: qty 1

## 2021-10-21 MED ORDER — CHLORHEXIDINE GLUCONATE 0.12 % MT SOLN
OROMUCOSAL | Status: AC
  Administered 2021-10-21: 15 mL via OROMUCOSAL
  Filled 2021-10-21: qty 15

## 2021-10-21 MED ORDER — PHENYLEPHRINE HCL (PRESSORS) 10 MG/ML IV SOLN
INTRAVENOUS | Status: DC | PRN
  Administered 2021-10-21: 80 ug via INTRAVENOUS

## 2021-10-21 SURGICAL SUPPLY — 58 items
BLADE CLIPPER SURG (BLADE) ×2 IMPLANT
CANNULA CAP OBTURATR AIRSEAL 8 (CAP) ×2 IMPLANT
CATH FOLEY 2WAY  5CC 16FR (CATHETERS) ×1
CATH URTH 16FR FL 2W BLN LF (CATHETERS) IMPLANT
CHLORAPREP W/TINT 26 (MISCELLANEOUS) ×2 IMPLANT
COVER TIP SHEARS 8 DVNC (MISCELLANEOUS) ×1 IMPLANT
COVER TIP SHEARS 8MM DA VINCI (MISCELLANEOUS) ×1
COVER WAND RF STERILE (DRAPES) ×2 IMPLANT
DEFOGGER SCOPE WARMER CLEARIFY (MISCELLANEOUS) ×2 IMPLANT
DERMABOND ADVANCED (GAUZE/BANDAGES/DRESSINGS) ×1
DERMABOND ADVANCED .7 DNX12 (GAUZE/BANDAGES/DRESSINGS) ×1 IMPLANT
DRAPE 3/4 80X56 (DRAPES) ×1 IMPLANT
DRAPE ARM DVNC X/XI (DISPOSABLE) ×3 IMPLANT
DRAPE COLUMN DVNC XI (DISPOSABLE) ×1 IMPLANT
DRAPE DA VINCI XI ARM (DISPOSABLE) ×3
DRAPE DA VINCI XI COLUMN (DISPOSABLE) ×1
ELECT CAUTERY BLADE 6.4 (BLADE) ×1 IMPLANT
ELECT REM PT RETURN 9FT ADLT (ELECTROSURGICAL) ×2
ELECTRODE REM PT RTRN 9FT ADLT (ELECTROSURGICAL) ×1 IMPLANT
GLOVE SURG ORTHO LTX SZ7.5 (GLOVE) ×11 IMPLANT
GOWN STRL REUS W/ TWL LRG LVL3 (GOWN DISPOSABLE) ×3 IMPLANT
GOWN STRL REUS W/TWL LRG LVL3 (GOWN DISPOSABLE) ×6
GRASPER SUT TROCAR 14GX15 (MISCELLANEOUS) ×1 IMPLANT
IRRIGATION STRYKERFLOW (MISCELLANEOUS) IMPLANT
IRRIGATOR STRYKERFLOW (MISCELLANEOUS)
IV CATH ANGIO 14GX1.88 NO SAFE (IV SOLUTION) ×2 IMPLANT
IV NS 1000ML (IV SOLUTION)
IV NS 1000ML BAXH (IV SOLUTION) IMPLANT
KIT PINK PAD W/HEAD ARE REST (MISCELLANEOUS) ×2
KIT PINK PAD W/HEAD ARM REST (MISCELLANEOUS) ×1 IMPLANT
LABEL OR SOLS (LABEL) ×2 IMPLANT
MANIFOLD NEPTUNE II (INSTRUMENTS) ×2 IMPLANT
MESH 3DMAX LIGHT 4.1X6.2 RT LR (Mesh General) ×1 IMPLANT
NDL INSUFFLATION 14GA 120MM (NEEDLE) IMPLANT
NEEDLE HYPO 22GX1.5 SAFETY (NEEDLE) ×2 IMPLANT
NEEDLE INSUFFLATION 14GA 120MM (NEEDLE) ×2 IMPLANT
PACK LAP CHOLECYSTECTOMY (MISCELLANEOUS) ×2 IMPLANT
PENCIL ELECTRO HAND CTR (MISCELLANEOUS) ×1 IMPLANT
SEAL CANN UNIV 5-8 DVNC XI (MISCELLANEOUS) ×2 IMPLANT
SEAL XI 5MM-8MM UNIVERSAL (MISCELLANEOUS) ×2
SET TUBE FILTERED XL AIRSEAL (SET/KITS/TRAYS/PACK) ×2 IMPLANT
SOLUTION ELECTROLUBE (MISCELLANEOUS) ×2 IMPLANT
SUPPORT SCROTAL LG STRP (MISCELLANEOUS) ×1 IMPLANT
SUT MNCRL 4-0 (SUTURE) ×1
SUT MNCRL 4-0 27XMFL (SUTURE) ×1
SUT STRATAFIX 0 PDS+ CT-2 23 (SUTURE)
SUT V-LOC 90 ABS 3-0 VLT  V-20 (SUTURE)
SUT V-LOC 90 ABS 3-0 VLT V-20 (SUTURE) IMPLANT
SUT VIC AB 0 CT2 27 (SUTURE) ×2 IMPLANT
SUT VIC AB 2-0 RB1 27 (SUTURE) ×2 IMPLANT
SUT VIC AB 2-0 SH 27 (SUTURE) ×1
SUT VIC AB 2-0 SH 27XBRD (SUTURE) IMPLANT
SUT VLOC 90 2/L VL 12 GS22 (SUTURE) IMPLANT
SUT VLOC 90 S/L VL9 GS22 (SUTURE) ×1 IMPLANT
SUTURE MNCRL 4-0 27XMF (SUTURE) ×1 IMPLANT
SUTURE STRATFX 0 PDS+ CT-2 23 (SUTURE) IMPLANT
TROCAR Z-THREAD FIOS 11X100 BL (TROCAR) ×1 IMPLANT
WATER STERILE IRR 500ML POUR (IV SOLUTION) ×2 IMPLANT

## 2021-10-21 NOTE — Anesthesia Preprocedure Evaluation (Addendum)
Anesthesia Evaluation  Patient identified by MRN, date of birth, ID band Patient awake    Reviewed: Allergy & Precautions, NPO status , Patient's Chart, lab work & pertinent test results, reviewed documented beta blocker date and time   Airway Mallampati: III  TM Distance: >3 FB Neck ROM: full    Dental  (+) Implants   Pulmonary neg pulmonary ROS, former smoker,    Pulmonary exam normal        Cardiovascular Exercise Tolerance: Good hypertension, Pt. on medications and Pt. on home beta blockers +CHF (nonischemic cardiomyopathy) and + DOE  + dysrhythmias (RBBB. Chronically anticoagulated using daily warfarin) Atrial Fibrillation + Valvular Problems/Murmurs (mild MR/TR)  Rhythm:Irregular Rate:Normal - Systolic murmurs TTE 76/73/4193: EF 45-50%  ECG: Date: 06/04/2021 Rate: 51 bpm Rhythm: atrial fibrillation; RBBB Axis (leads I and aVF): Left axis deviation Intervals: QRS 138 ms. QTc 449 ms. ST segment and T wave changes: No evidence of acute ST segment elevation or depression Comparison: Similar to previous tracing obtained on 07/27/2020 NOTE: Tracing obtained at [facility]; unable for review. Above based on cardiologist's interpretation.   H/o DVT  Long-term cardiac event monitor study performed on 06/04/2021 revealed predominant underlying atrial fibrillation with occasional PVCs.  Evidence of sick sinus syndrome and asymptomatic bradycardia with long RR intervals of >3 seconds noted.  Pt's cardiologist instructed pt to hold his warfarin for 4 days prior to his procedure   Neuro/Psych PSYCHIATRIC DISORDERS ADHD negative neurological ROS     GI/Hepatic negative GI ROS, Neg liver ROS,   Endo/Other  negative endocrine ROS  Renal/GU    BPH    Musculoskeletal  (+) Arthritis  (DDD), Osteoarthritis,    Abdominal Normal abdominal exam  (+)   Peds  Hematology  (+) Blood dyscrasia (thrombocytopenia), anemia ,    Anesthesia Other Findings Past Medical History: No date: A-fib [facility])     Comment:  a.) CHA2DS2-VASc = 5 (age x2, HTN, DVT x2). b.)               rate/rhythm maintained on oral carvedilol; chronically               anticoagulated using daily warfarin. No date: Actinic keratosis No date: Anemia No date: BPH (benign prostatic hyperplasia) No date: DOE (dyspnea on exertion) No date: DVT (deep venous thrombosis) (HCC) No date: Elevated PSA No date: H/O degenerative disc disease No date: HLD (hyperlipidemia) No date: Hypertension No date: Lipoma of arm No date: Lipomatosis No date: Long term current use of anticoagulant     Comment:  a.) warfarin 11/22/2015: NICM (nonischemic cardiomyopathy) ([place])     Comment:  a.) TTE 11/22/2015: EF 40%. b.) TTE 12/27/2018: EF 45%.               c.) TTE 08/30/2020: EF 45-50%. No date: Night sweats No date: Obesity No date: Osteoarthritis No date: RBBB (right bundle branch block) No date: Right inguinal hernia 11/22/2015: VHD (valvular heart disease)     Comment:  a.) TTE 11/22/2015: EF 40%; mod MR, mod-sev TR, triv PR;              mod BAE, mild RV enlargement. b.) TTE 12/27/2018: EF 45%;              mod MR, mod-sev TR, triv PR; mod BAE, mild RV               enlargement. c.) TTE 08/30/2020: EF 45-50%; mild MR/TR.  Past Surgical History: 1997: Bilateral Radical  Keratotomy; Bilateral 01/11/2016: COLONOSCOPY WITH PROPOFOL; N/A     Comment:  Procedure: COLONOSCOPY WITH PROPOFOL;  Surgeon: Manya Silvas, MD;  Location: [facility] ENDOSCOPY;  Service:               Endoscopy;  Laterality: N/A; repeat 3 years, tubular               adenoma No date: deep vein thrombosis No date: DENTAL SURGERY     Comment:  Patient had implants No date: GUM SURGERY No date: L4 - S1 decompression; Left     Comment:  Left-sided No date: PROSTATE BIOPSY age 87: TONSILLECTOMY AND ADENOIDECTOMY     Reproductive/Obstetrics negative OB ROS                             Anesthesia Physical Anesthesia Plan  ASA: 3  Anesthesia Plan: General ETT   Post-op Pain Management: Gabapentin PO (pre-op)*, Celebrex PO (pre-op)*, Tylenol PO (pre-op)* and Regional block*   Induction:   PONV Risk Score and Plan: Ondansetron, Dexamethasone and Treatment may vary due to age or medical condition  Airway Management Planned: Oral ETT  Additional Equipment:   Intra-op Plan:   Post-operative Plan:   Informed Consent: I have reviewed the patients History and Physical, chart, labs and discussed the procedure including the risks, benefits and alternatives for the proposed anesthesia with the patient or authorized representative who has indicated his/her understanding and acceptance.     Dental advisory given  Plan Discussed with: Anesthesiologist, CRNA and Surgeon  Anesthesia Plan Comments:        Anesthesia Quick Evaluation

## 2021-10-21 NOTE — Op Note (Signed)
Robotic assisted Laparoscopic Transabdominal right inguinal Hernia Repair with Mesh       Pre-operative Diagnosis: Right inguinal Hernia   Post-operative Diagnosis: Same   Procedure: Robotic assisted Laparoscopic  repair of right inguinal hernia(s)   Surgeon: Ronny Bacon, M.D., FACS   Anesthesia: GETA   Findings: Right inguinal hernia, no evidence of left sided hernia.         Procedure Details  The patient was seen again in the Holding Room. The benefits, complications, treatment options, and expected outcomes were discussed with the patient. The risks of bleeding, infection, recurrence of symptoms, failure to resolve symptoms, recurrence of hernia, ischemic orchitis, chronic pain syndrome or neuroma, were reviewed again. The likelihood of improving the patient's symptoms with return to their baseline status is good.  The patient and/or family concurred with the proposed plan, giving informed consent.  The patient was taken to Operating Room, identified  and the procedure verified as Laparoscopic Inguinal Hernia Repair. Laterality confirmed.  A Time Out was held and the above information confirmed.   Prior to the induction of general anesthesia, antibiotic prophylaxis was administered. VTE prophylaxis was in place. General endotracheal anesthesia was then administered and tolerated well. After the induction, the abdomen was prepped with Chloraprep and draped in the sterile fashion. The patient was positioned in the supine position.   After local infiltration of quarter percent Marcaine with epinephrine, stab incision was made left upper quadrant.  On the left at Palmer's point, the Veress needle is passed with sensation of the layers to penetrate the abdominal wall and into the peritoneum.  Saline drop test is confirmed peritoneal placement.  Insufflation is initiated with carbon dioxide to pressures of 15 mmHg. An 8.5 mm port is placed to the left off of the midline, with blunt tipped  trocar.  Pneumoperitoneum maintained w/o HD changes using the AirSeal to pressures of 15 mm Hg with CO2. No evidence of bowel injuries.  Two 8.5 mm ports placed under direct vision in each upper quadrant. The laparoscopy revealed right indirect defect(s).   I asked our nursing staff to proceed with straight catheterization, as his bladder was distended and clearly going to create difficulty for proceeding with the operation.  Once this was completed, the robot was brought ot the table and docked in the standard fashion, no collision between arms was observed. Instruments were kept under direct view at all times. For right inguinal hernia repair,  I developed a peritoneal flap. The sac(s) were reduced and dissected free from adjacent structures. We preserved the vas and the vessels, and visualized them to their convergence and beyond in the retroperitoneum. Once dissection was completed a large right sided BARD 3D Light mesh was placed and secured at three points with interrupted 0 Vicryl to the pubic tubercle and anteriorly. There was good coverage of the direct, indirect and femoral spaces.  Second look revealed no complications or injuries. The flap was then closed with 2-0 V-lock suture.  Peritoneal closure without defects.   Once assuring that hemostasis was adequate, all needles/sponges removed, and the robot was undocked.  Under laparoscopic visualization a large angiocath is placed under direct visualization in the groin to reduce trapped extraperitoneal air and confirm adequate peritoneal closure.     The ports were removed, the abdomen desulflated.  4-0 subcuticular Monocryl was used at all skin edges. Dermabond was placed.  Patient tolerated the procedure well. There were no complications. He was taken to the recovery room in stable condition.  Ronny Bacon, M.D., FACS 10/21/2021, 3:26 PM

## 2021-10-21 NOTE — Transfer of Care (Signed)
Immediate Anesthesia Transfer of Care Note  [name]  Procedure(s) Performed: XI ROBOTIC ASSISTED INGUINAL HERNIA REPAIR WITH MESH (Right)  Patient Location: PACU  Anesthesia Type:General  Level of Consciousness: sedated  Airway & Oxygen Therapy: Patient Spontanous Breathing and Patient connected to face mask oxygen  Post-op Assessment: Report given to RN and Post -op Vital signs reviewed and stable  Post vital signs: Reviewed and stable  Last Vitals:  Vitals Value Taken Time  BP 115/67   Temp    Pulse 62   Resp 15   SpO2 98     Last Pain:  Vitals:   10/21/21 1212  TempSrc: Temporal  PainSc: 0-No pain      Patients Stated Pain Goal: 0 (31/49/70 2637)  Complications: No notable events documented.

## 2021-10-21 NOTE — Anesthesia Procedure Notes (Signed)
Procedure Name: Intubation Date/Time: 10/21/2021 1:57 PM Performed by: Aline Brochure, CRNA Pre-anesthesia Checklist: Patient identified, Patient being monitored, Timeout performed, Emergency Drugs available and Suction available Patient Re-evaluated:Patient Re-evaluated prior to induction Oxygen Delivery Method: Circle system utilized Preoxygenation: Pre-oxygenation with 100% oxygen Induction Type: IV induction Ventilation: Mask ventilation without difficulty Laryngoscope Size: 3 and McGraph Grade View: Grade I Tube type: Oral Tube size: 7.5 mm Number of attempts: 1 Airway Equipment and Method: Stylet and Video-laryngoscopy Placement Confirmation: ETT inserted through vocal cords under direct vision, positive ETCO2 and breath sounds checked- equal and bilateral Secured at: 21 cm Tube secured with: Tape Dental Injury: Teeth and Oropharynx as per pre-operative assessment

## 2021-10-21 NOTE — Interval H&P Note (Signed)
History and Physical Interval Note:  10/21/2021 1:19 PM  Casey Gallegos  has presented today for surgery, with the diagnosis of right inguinal hernia.  The various methods of treatment have been discussed with the patient and family. After consideration of risks, benefits and other options for treatment, the patient has consented to  Procedure(s): XI ROBOTIC [place] (Right) as a surgical intervention.  The patient's history has been reviewed, patient examined, no change in status, stable for surgery.  I have reviewed the patient's chart and labs.  Questions were answered to the patient's satisfaction.   The right side is marked.   Ronny Bacon

## 2021-10-21 NOTE — Progress Notes (Signed)
Informed dr Christian Mate that patient unable to empty bladder.  Even after urinating 300 ml pt still had 402 ml left on bladder scanner.  Foley placed per dr Christian Mate.  Notified dr Erlene Quan who is patient's urologist that foley was placed s/p inguinal hernia surgery r/t retention.  Pt will need to f/u with urology in 3-7 days per dr Erlene Quan; pt/family aware as well as dr Erlene Quan that pt will need appointment.  Spent approximately 40-45 min on foley education and care with pt and wife. They verbalized understanding of care and are aware of their resources.  No further questions.

## 2021-10-21 NOTE — Discharge Instructions (Signed)

## 2021-10-21 NOTE — OR Nursing (Signed)
Dr. Christian Mate requested that the patient have a straight catheter placed to drain the bladder. I  prepped the penis with Betadine and placed a 16 french catheter into the urethra. 563ml's of urine was drained from the bladder and catheter was removed.

## 2021-10-22 ENCOUNTER — Telehealth: Payer: Self-pay | Admitting: Urology

## 2021-10-22 ENCOUNTER — Encounter: Payer: Self-pay | Admitting: Surgery

## 2021-10-22 NOTE — Anesthesia Postprocedure Evaluation (Signed)
Anesthesia Post Note  [name]  Procedure(s) Performed: XI ROBOTIC ASSISTED INGUINAL HERNIA REPAIR WITH MESH (Right)  Patient location during evaluation: PACU Anesthesia Type: General Level of consciousness: awake and alert Pain management: pain level controlled Vital Signs Assessment: post-procedure vital signs reviewed and stable Respiratory status: spontaneous breathing, nonlabored ventilation and respiratory function stable Cardiovascular status: blood pressure returned to baseline and stable Postop Assessment: no apparent nausea or vomiting Anesthetic complications: no   No notable events documented.   Last Vitals:  Vitals:   10/21/21 1640 10/21/21 1809  BP: (!) 157/85 (!) 164/96  Pulse:    Resp: 16 14  Temp: (!) 36.1 C (!) 36.1 C  SpO2: 99% 98%    Last Pain:  Vitals:   10/21/21 1809  TempSrc:   PainSc: [place]

## 2021-10-23 ENCOUNTER — Ambulatory Visit: Payer: Medicare HMO

## 2021-10-25 NOTE — Progress Notes (Signed)
11/03/21 2:29 PM   [name] [birth year] [id number]  Referring provider:  Jerrol Banana., MD [address] [place],  [place] [address]  Chief Complaint  Patient presents with   Follow-up    Voiding trial   Urological history  1. High risk hematuria - Former smoker - Incidental microscopic hematuria visit 09/2016 (3-10 RBC) with associated dysuria which has resolved -Urine cytology 09/1016 negative -UA x several negative for micro heme -declined work up   2. Elevated PSA - negative prostate biopsy in 05/2011 by Dr. Bernardo Heater - PSA trend Prostate Specific Ag, Serum 0.0 - 4.0 ng/mL 12.1 High    Comment: Roche ECLIA methodology.  According to the American Urological Association, Serum PSA should  decrease and remain at undetectable levels after radical  prostatectomy. The AUA defines biochemical recurrence as an initial  PSA value 0.2 ng/mL or greater followed by a subsequent confirmatory  PSA value 0.2 ng/mL or greater.  Values obtained with different assay methods or kits cannot be used  interchangeably. Results cannot be interpreted as absolute evidence  of the presence or absence of malignant disease.   Resulting Agency  [facility]       Narrative Performed by: Maryan Puls Performed at:  [address]  [address] [place], [place]  [id number]  Lab Director: Lindon Romp MD, Phone:  [phone]    Specimen Collected: 07/16/16 10:35 Last Resulted: 07/17/16 05:40         Component     Latest Ref Rng & Units 08/26/2017 12/28/2018  Prostate Specific Ag, Serum     0.0 - 4.0 ng/mL 5.5 (H) 3.7    Discontinued screening due to age and comorbidities  3. BPH with LU TS -TRUS vol 72 cc in 2012 - taking doxazosin 8 mg and finasteride 5 mg daily   4. Incomplete bladder emptying -PVR's ~200 cc -today's PVR 329 mL   5. ED -Contributing factors of age, anticoagulation therapy, BPH, CAD, HLD, spinal stenosis, heart disease and former  smoker -Managed with PDE 5 inhibitors in the past -Not interested in any further treatment  HPI: Casey Gallegos is a 87 y.o.male who presents today for a voiding trial and PVR after going into urinary retention F after a repair of a right inguinal hernia on October 21, 2021.  Foley catheter was removed this morning.     He has not been able to void with adequate stream today.  He is only had dribbles and he has suprapubic discomfort and penile burning.  Bladder scan notes a PVR of 329 mL.  He is taking oxycodone for pain.  He also has continued to take his doxazosin 8 mg daily and finasteride 5 mg daily.  Patient denies any modifying or aggravating factors.  Patient denies any gross hematuria, dysuria or flank pain.  Patient denies any fevers, chills, nausea or vomiting.    PMH: Past Medical History:  Diagnosis Date   A-fib [facility])    a.) CHA2DS2-VASc = 5 (age x2, HTN, DVT x2). b.) rate/rhythm maintained on oral carvedilol; chronically anticoagulated using daily warfarin.   Actinic keratosis    Anemia    BPH (benign prostatic hyperplasia)    DOE (dyspnea on exertion)    DVT (deep venous thrombosis) (HCC)    Elevated PSA    H/O degenerative disc disease    HLD (hyperlipidemia)    Hypertension    Lipoma of arm    Lipomatosis    Long term current use  of anticoagulant    a.) warfarin   NICM (nonischemic cardiomyopathy) ([place]) 11/22/2015   a.) TTE 11/22/2015: EF 40%. b.) TTE 12/27/2018: EF 45%. c.) TTE 08/30/2020: EF 45-50%.   Night sweats    Obesity    Osteoarthritis    RBBB (right bundle branch block)    Right inguinal hernia    VHD (valvular heart disease) 11/22/2015   a.) TTE 11/22/2015: EF 40%; mod MR, mod-sev TR, triv PR; mod BAE, mild RV enlargement. b.) TTE 12/27/2018: EF 45%; mod MR, mod-sev TR, triv PR; mod BAE, mild RV enlargement. c.) TTE 08/30/2020: EF 45-50%; mild MR/TR.    Surgical History: Past Surgical History:  Procedure Laterality Date   Bilateral Radical  Keratotomy Bilateral 1997   COLONOSCOPY WITH PROPOFOL N/A 01/11/2016   Procedure: COLONOSCOPY WITH PROPOFOL;  Surgeon: Manya Silvas, MD;  Location[facility] END[facility]COPY;  Service: Endoscopy;  Laterality: N/A; repeat 3 years, tubular adenoma   deep vein thrombosis     DENTAL SURGERY     Patient had implants   GUM SURGERY     L4 - S1 decompression Left    Left-sided   PROSTATE BIOPSY     TONSILLECTOMY AND ADENOIDECTOMY  age 41   XI ROBOTIC ASSISTED INGUINAL HERNIA REPAIR WITH MESH Right 10/21/2021   Procedure: XI ROBOTIC ASSISTED INGUINAL HERNIA REPAIR WITH MESH;  Surgeon: Ronny Bacon, MD;  Location: [facility] ORS;  Service: General;  Laterality: Right;    Home Medications:  Allergies as of 10/28/2021       Reactions   Cefdinir Nausea Only   hypersensitivity to smell   Doxycycline    Sun sensitivity   Prednisone    Hallucinations    Sertraline Other (See Comments)   Hallucinations         Medication List        Accurate as of October 28, 2021 11:59 PM. If you have any questions, ask your nurse or doctor.          amLODipine 5 MG tablet Commonly known as: NORVASC TAKE 1 TABLET EVERY DAY   benazepril 40 MG tablet Commonly known as: LOTENSIN TAKE 1 TABLET EVERY DAY   carvedilol 6.25 MG tablet Commonly known as: COREG TAKE 1 TABLET TWICE DAILY   Cortizone-10 Diabetics Skin 1 % lotion Generic drug: hydrocortisone Apply 1 application topically daily as needed for itching.   doxazosin 8 MG tablet Commonly known as: CARDURA TAKE 1 TABLET EVERY DAY   finasteride 5 MG tablet Commonly known as: PROSCAR TAKE 1 TABLET EVERY DAY   gabapentin 300 MG capsule Commonly known as: NEURONTIN TAKE 1 CAPSULE EVERY MORNING, 1 CAPSULE IN THE AFTERNOON AND 2 CAPSULES AT BEDTIME   ibuprofen 800 MG tablet Commonly known as: ADVIL Take 1 tablet (800 mg total) by mouth every 8 (eight) hours as needed.   Magnesium 500 MG Tabs Take 500 mg by mouth daily.   Multi-Vitamin tablet Take 1  tablet by mouth daily.   oxyCODONE 5 MG immediate release tablet Commonly known as: Oxy IR/ROXICODONE 1/2 pill to 1 pill daily as needed for severe pain. What changed:  how much to take how to take this when to take this reasons to take this additional instructions   oxyCODONE 5 MG immediate release tablet Commonly known as: Oxy IR/ROXICODONE Take 1 tablet (5 mg total) by mouth every 6 (six) hours as needed for severe pain. What changed: Another medication with the same name was changed. Make sure you understand how and when  to take each.   triamterene-hydrochlorothiazide 37.5-25 MG tablet Commonly known as: MAXZIDE-25 TAKE 1/2 TABLET EVERY DAY   VITAMIN B 12 PO Take by mouth.   warfarin 5 MG tablet Commonly known as: COUMADIN Take as directed by the anticoagulation clinic. If you are unsure how to take this medication, talk to your nurse or doctor. Original instructions: TAKE 1 TABLET EVERY DAY  AT  6PM        Allergies:  Allergies  Allergen Reactions   Cefdinir Nausea Only    hypersensitivity to smell   Doxycycline     Sun sensitivity   Prednisone     Hallucinations    Sertraline Other (See Comments)    Hallucinations     Family History: Family History  Problem Relation Age of Onset   Cancer Mother        secondary to cancinoma of the jaw from her dipping snuff.   Heart attack Father    CAD Father    Hypertension Sister    Diabetes Son        Type I diabetes   Prostate cancer Neg Hx    Kidney cancer Neg Hx     Social History:  reports that he quit smoking about 50 years ago. His smoking use included cigarettes. He has never been exposed to tobacco smoke. He has never used smokeless tobacco. He reports that he does not currently use alcohol. He reports that he does not use drugs.   Physical Exam: BP (!) 126/58    Pulse 78    Ht 5\' 9"  (1.753 m)    Wt 178 lb (80.7 kg)    BMI 26.29 kg/m   Constitutional:  Well nourished. Alert and oriented, No acute  distress. HEENT: [place] AT, mask in place.  Trachea midline Cardiovascular: No clubbing, cyanosis, or edema. Respiratory: Normal respiratory effort, no increased work of breathing. GU: No CVA tenderness.  No bladder fullness or masses.  Patient with normal phallus. Urethral meatus is patent.  No penile discharge. No penile lesions or rashes. Scrotum without lesions, cysts, rashes and/or edema.   Neurologic: Grossly intact, no focal deficits, moving all 4 extremities. Psychiatric: Normal mood and affect.   Laboratory Data: Lab Results  Component Value Date   CREATININE 1.05 10/18/2021   Lab Results  Component Value Date   HGBA1C 5.7 (H) 09/08/2017   Component     Latest Ref Rng & Units 10/18/2021  Sodium     135 - 145 mmol/L 139  Potassium     3.5 - 5.1 mmol/L 4.1  Chloride     98 - 111 mmol/L 109  CO2     22 - 32 mmol/L 26  Glucose     70 - 99 mg/dL 94  BUN     8 - 23 mg/dL 24 (H)  Creatinine     0.61 - 1.24 mg/dL 1.05  Calcium     8.9 - 10.3 mg/dL 8.5 (L)  Total Protein     6.5 - 8.1 g/dL 6.3 (L)  Albumin     3.5 - 5.0 g/dL 3.6  AST     15 - 41 U/L 19  ALT     0 - 44 U/L 16  Alkaline Phosphatase     38 - 126 U/L 57  Total Bilirubin     0.3 - 1.2 mg/dL 1.1  GFR, Estimated     >60 mL/min >60  Anion gap     5 - 15 4 (L)  Component     Latest Ref Rng & Units 08/28/2021  TSH     0.450 - 4.500 uIU/mL 2.390  I have reviewed the labs.    Pertinent Imaging:  10/28/21 14:22  Scan Result 337ml   Simple Catheter Placement Due to urinary retention patient is present today for a foley cath placement.  Patient was cleaned and prepped in a sterile fashion with betadine. A 16 FR foley catheter was inserted, urine return was noted  500 ml, urine was dark yellow in color.  The balloon was filled with 10cc of sterile water.  A leg bag was attached for drainage. Patient was also given a night bag to take home and was given instruction on how to change from one bag to another.   Patient was given instruction on proper catheter care.  Patient tolerated well, no complications were noted   Performed by: Zara Council, PA-C   Assessment & Plan:    1. Urinary retention -Secondary to postoperative retention -Offered patient and his wife instruction on self catheter relation techniques versus in starting another Foley at this time, they deferred -Foley replaced -Discussed with patient and wife how anesthesia and BPH can work detrimentally together to cause the inability to urinate and also with the use of narcotic pain medication and constipation can also influence bladder function -At this time we will keep the Foley in for another week and repeat voiding trial next week -He is to continue his doxazosin 8 mg and finasteride 5 mg daily -If he continues to have issues with retention, I have encouraged him to undergo cystoscopy for further evaluation of B00 and possible bladder outlet procedure  Return in about 1 week (around 11/04/2021) for TOV and PVR.  [place] [address] [place], [place] [address] [phone]  [facility] as a scribe for [facility], PA-C.,have documented all relevant documentation on the behalf of Jaqwon Manfred, PA-C,as directed by  [facility], PA-C while in the presence of Selim Durden, PA-C.  I have reviewed the above documentation for accuracy and completeness, and I agree with the above.    Zara Council, PA-C    I spent 25 minutes on the day of the encounter to include pre-visit record review, face-to-face time with the patient, and post-visit ordering of tests.

## 2021-10-27 NOTE — Progress Notes (Incomplete)
10/27/21 ?9:21 AM  ? ?[name] ?[birth year] ?[id number] ? ?Referring provider:  ?Jerrol Banana., MD ?[place] ?[address] ?[place],  [place] [address] ?No chief complaint on file. ? ? ?Urological history  ?1. High risk hematuria ?- Former smoker ?- Incidental microscopic hematuria visit 09/1016 (3-10 RBC) with associated dysuria which has resolved.  Urine cytology 09/1016 negative ?- declined work up ?- no reports of gross heme *** ?  ?2. Elevated PSA ?- negative prostate biopsy in 05/2011 by Dr. Bernardo Heater ?- PSA trend          ?Component Prostate Specific Ag, Serum  ?Latest Ref Rng & Units 0.0 - 4.0 ng/mL  ?2015 12.1  ?08/26/2017 5.5 (H)  ?12/28/2018 3.7  ? ?  ?3. BPH with LU TS ?- I PSS: *** ?- taking doxazosin 8 mg and finasteride 5 mg daily ?  ?4. Incomplete bladder emptying ?- PVR's ~*** ? ? ?HPI: ?Casey Gallegos is a 87 y.o.male who presents today for a PVR.  ? ? ? ? ? ?PMH: ?Past Medical History:  ?Diagnosis Date  ? A-fib ([place])   ? a.) CHA2DS2-VASc = 5 (age x2, HTN, DVT x2). b.) rate/rhythm maintained on oral carvedilol; chronically anticoagulated using daily warfarin.  ? Actinic keratosis   ? Anemia   ? BPH (benign prostatic hyperplasia)   ? DOE (dyspnea on exertion)   ? DVT (deep venous thrombosis) ([place])   ? Elevated PSA   ? H/O degenerative disc disease   ? HLD (hyperlipidemia)   ? Hypertension   ? Lipoma of arm   ? Lipomatosis   ? Long term current use of anticoagulant   ? a.) warfarin  ? NICM (nonischemic cardiomyopathy) ([place]) 11/22/2015  ? a.) TTE 11/22/2015: EF 40%. b.) TTE 12/27/2018: EF 45%. c.) TTE 08/30/2020: EF 45-50%.  ? Night sweats   ? Obesity   ? Osteoarthritis   ? RBBB (right bundle branch block)   ? Right inguinal hernia   ? VHD (valvular heart disease) 11/22/2015  ? a.) TTE 11/22/2015: EF 40%; mod MR, mod-sev TR, triv PR; mod BAE, mild RV enlargement. b.) TTE 12/27/2018: EF 45%; mod MR, mod-sev TR, triv PR; mod BAE, mild RV enlargement. c.) TTE 08/30/2020: EF 45-50%; mild MR/TR.   ? ? ?Surgical History: ?Past Surgical History:  ?Procedure Laterality Date  ? Bilateral Radical Keratotomy Bilateral 1997  ? COLONOSCOPY WITH PROPOFOL N/A 01/11/2016  ? Procedure: COLONOSCOPY WITH PROPOFOL;  Surgeon: Manya Silvas, MD;  Location: [facility] ENDOSCOPY;  Service: Endoscopy;  Laterality: N/A; repeat 3 years, tubular adenoma  ? deep vein thrombosis    ? DENTAL SURGERY    ? Patient had implants  ? GUM SURGERY    ? L4 - S1 decompression Left   ? Left-sided  ? PROSTATE BIOPSY    ? TONSILLECTOMY AND ADENOIDECTOMY  age 27  ? XI ROBOTIC ASSISTED INGUINAL HERNIA REPAIR WITH MESH Right 10/21/2021  ? Procedure: XI ROBOTIC ASSISTED INGUINAL HERNIA REPAIR WITH MESH;  Surgeon: Ronny Bacon, MD;  Location: [facility] ORS;  Service: General;  Laterality: Right;  ? ? ?Home Medications:  ?Allergies as of 10/28/2021   ? ?   Reactions  ? Cefdinir Nausea Only  ? hypersensitivity to smell  ? Doxycycline   ? Sun sensitivity  ? Prednisone   ? Hallucinations   ? Sertraline Other (See Comments)  ? Hallucinations   ? ?  ? ?  ?Medication List  ?  ? ?  ? Accurate as  of October 27, 2021  9:21 AM. If you have any questions, ask your nurse or doctor.  ?  ?  ? ?  ? ?amLODipine 5 MG tablet ?Commonly known as: NORVASC ?TAKE 1 TABLET EVERY DAY ?  ?benazepril 40 MG tablet ?Commonly known as: LOTENSIN ?TAKE 1 TABLET EVERY DAY ?  ?carvedilol 6.25 MG tablet ?Commonly known as: COREG ?TAKE 1 TABLET TWICE DAILY ?  ?Cortizone-10 Diabetics Skin 1 % lotion ?Generic drug: hydrocortisone ?Apply 1 application topically daily as needed for itching. ?  ?doxazosin 8 MG tablet ?Commonly known as: CARDURA ?TAKE 1 TABLET EVERY DAY ?  ?finasteride 5 MG tablet ?Commonly known as: PROSCAR ?TAKE 1 TABLET EVERY DAY ?  ?gabapentin 300 MG capsule ?Commonly known as: NEURONTIN ?TAKE 1 CAPSULE EVERY MORNING, 1 CAPSULE IN THE AFTERNOON AND 2 CAPSULES AT BEDTIME ?  ?ibuprofen 800 MG tablet ?Commonly known as: ADVIL ?Take 1 tablet (800 mg total) by mouth every 8 (eight) hours  as needed. ?  ?Magnesium 500 MG Tabs ?Take 500 mg by mouth daily. ?  ?Multi-Vitamin tablet ?Take 1 tablet by mouth daily. ?  ?oxyCODONE 5 MG immediate release tablet ?Commonly known as: Oxy IR/ROXICODONE ?1/2 pill to 1 pill daily as needed for severe pain. ?What changed:  ?how much to take ?how to take this ?when to take this ?reasons to take this ?additional instructions ?  ?oxyCODONE 5 MG immediate release tablet ?Commonly known as: Oxy IR/ROXICODONE ?Take 1 tablet (5 mg total) by mouth every 6 (six) hours as needed for severe pain. ?What changed: Another medication with the same name was changed. Make sure you understand how and when to take each. ?  ?triamterene-hydrochlorothiazide 37.5-25 MG tablet ?Commonly known as: MAXZIDE-25 ?TAKE 1/2 TABLET EVERY DAY ?  ?VITAMIN B 12 PO ?Take by mouth. ?  ?warfarin 5 MG tablet ?Commonly known as: COUMADIN ?Take as directed by the anticoagulation clinic. If you are unsure how to take this medication, talk to your nurse or doctor. ?Original instructions: TAKE 1 TABLET EVERY DAY  AT  6PM ?  ? ?  ? ? ?Allergies:  ?Allergies  ?Allergen Reactions  ? Cefdinir Nausea Only  ?  hypersensitivity to smell  ? Doxycycline   ?  Sun sensitivity  ? Prednisone   ?  Hallucinations   ? Sertraline Other (See Comments)  ?  Hallucinations   ? ? ?Family History: ?Family History  ?Problem Relation Age of Onset  ? Cancer Mother   ?     secondary to cancinoma of the jaw from her dipping snuff.  ? Heart attack Father   ? CAD Father   ? Hypertension Sister   ? Diabetes Son   ?     Type I diabetes  ? Prostate cancer Neg Hx   ? Kidney cancer Neg Hx   ? ? ?Social History:  reports that he quit smoking about 50 years ago. His smoking use included cigarettes. He has never been exposed to tobacco smoke. He has never used smokeless tobacco. He reports that he does not currently use alcohol. He reports that he does not use drugs. ? ? ?Physical Exam: ?There were no vitals taken for this visit.   ?Constitutional:  Alert and oriented, No acute distress. ?HEENT: [place] AT, moist mucus membranes.  Trachea midline, no masses. ?Cardiovascular: No clubbing, cyanosis, or edema. ?Respiratory: Normal respiratory effort, no increased work of breathing. ?GI: Abdomen is soft, nontender, nondistended, no abdominal masses ?GU: No CVA tenderness ?Lymph: No cervical or  inguinal lymphadenopathy. ?Skin: No rashes, bruises or suspicious lesions. ?Neurologic: Grossly intact, no focal deficits, moving all 4 extremities. ?Psychiatric: Normal mood and affect. ? ?Laboratory Data: ? ?Lab Results  ?Component Value Date  ? CREATININE 1.05 10/18/2021  ? ?Lab Results  ?Component Value Date  ? HGBA1C 5.7 (H) 09/08/2017  ? ?Component ?    Latest Ref Rng & Units 10/18/2021  ?Sodium ?    135 - 145 mmol/L 139  ?Potassium ?    3.5 - 5.1 mmol/L 4.1  ?Chloride ?    98 - 111 mmol/L 109  ?CO2 ?    22 - 32 mmol/L 26  ?Glucose ?    70 - 99 mg/dL 94  ?BUN ?    8 - 23 mg/dL 24 (H)  ?Creatinine ?    0.61 - 1.24 mg/dL 1.05  ?Calcium ?    8.9 - 10.3 mg/dL 8.5 (L)  ?Total Protein ?    6.5 - 8.1 g/dL 6.3 (L)  ?Albumin ?    3.5 - 5.0 g/dL 3.6  ?AST ?    15 - 41 U/L 19  ?ALT ?    0 - 44 U/L 16  ?Alkaline Phosphatase ?    38 - 126 U/L 57  ?Total Bilirubin ?    0.3 - 1.2 mg/dL 1.1  ?GFR, Estimated ?    >60 mL/min >60  ?Anion gap ?    5 - 15 4 (L)  ? ?Component ?    Latest Ref Rng & Units 08/28/2021  ?TSH ?    0.450 - 4.500 uIU/mL 2.390  ? ?Urinalysis ? ? ?Pertinent Imaging: ? ? ?Assessment & Plan:   ? ? ?No follow-ups on file. ? ?[facility] ?[address] ?[place], [place] [address] ?[phone] ? ?I,Kailey Littlejohn,acting as a Education administrator for [employer], PA-C.,have documented all relevant documentation on the behalf of [place], PA-C,as directed by  [facility], PA-C while in the presence of [place], PA-C. ? ?

## 2021-10-28 ENCOUNTER — Ambulatory Visit: Payer: Medicare HMO | Admitting: Urology

## 2021-10-28 ENCOUNTER — Encounter: Payer: Self-pay | Admitting: Urology

## 2021-10-28 ENCOUNTER — Other Ambulatory Visit: Payer: Self-pay

## 2021-10-28 VITALS — BP 126/58 | HR 78 | Ht 69.0 in | Wt 178.0 lb

## 2021-10-28 DIAGNOSIS — R339 Retention of urine, unspecified: Secondary | ICD-10-CM | POA: Diagnosis not present

## 2021-10-28 LAB — BLADDER SCAN AMB NON-IMAGING

## 2021-10-28 NOTE — Progress Notes (Signed)
Patient ID: Casey Gallegos, male   DOB: 14-Feb-1935, 87 y.o.   MRN: [id number] ?Catheter Removal ? ?Patient is present today for a catheter removal.  2ml of water was drained from the balloon. A 16FR foley cath was removed from the bladder no complications were noted . Patient tolerated well. ? ?Performed by: Edwin Dada, CMA ? ?Follow up/ Additional notes: this afternoon at 3:00pm  ?

## 2021-11-01 ENCOUNTER — Telehealth: Payer: Self-pay

## 2021-11-01 NOTE — Telephone Encounter (Signed)
Copied from [place] [phone]. Topic: Appointment Scheduling - Scheduling Inquiry for Clinic ?>> Nov 01, 2021 10:06 AM Pawlus, Brayton Layman A wrote: ?Reason for CRM: Pt called in wanting to schedule his INR, please advise. ?

## 2021-11-05 ENCOUNTER — Ambulatory Visit: Payer: Medicare HMO | Admitting: Urology

## 2021-11-05 ENCOUNTER — Encounter: Payer: Medicare HMO | Admitting: Surgery

## 2021-11-05 ENCOUNTER — Other Ambulatory Visit: Payer: Self-pay

## 2021-11-05 ENCOUNTER — Encounter: Payer: Self-pay | Admitting: Urology

## 2021-11-05 DIAGNOSIS — N401 Enlarged prostate with lower urinary tract symptoms: Secondary | ICD-10-CM

## 2021-11-05 DIAGNOSIS — R972 Elevated prostate specific antigen [PSA]: Secondary | ICD-10-CM | POA: Diagnosis not present

## 2021-11-05 DIAGNOSIS — R3914 Feeling of incomplete bladder emptying: Secondary | ICD-10-CM | POA: Diagnosis not present

## 2021-11-05 DIAGNOSIS — R338 Other retention of urine: Secondary | ICD-10-CM | POA: Diagnosis not present

## 2021-11-05 LAB — BLADDER SCAN AMB NON-IMAGING

## 2021-11-05 MED ORDER — NITROFURANTOIN MONOHYD MACRO 100 MG PO CAPS: 100.0000 mg | ORAL_CAPSULE | Freq: Two times a day (BID) | ORAL | 0 refills | Status: DC

## 2021-11-05 NOTE — Addendum Note (Signed)
Addended by: Evelina Bucy on: 11/05/2021 02:13 PM ? ? Modules accepted: Orders ? ?

## 2021-11-05 NOTE — Progress Notes (Signed)
Catheter Removal ? ?Patient is present today for a catheter removal.  110ml of water was drained from the balloon. A 16FR foley cath was removed from the bladder no complications were noted . Patient tolerated well. ? ?Performed by: Gaspar Cola CMA ? ?Follow up/ Additional notes: This afternonn  ?

## 2021-11-05 NOTE — Progress Notes (Signed)
11/06/21 ?7:56 AM  ? ?Casey Gallegos ?[birth year] ?[id number] ? ?Referring provider:  ?Casey Gallegos., MD ?[facility] ?[address] ?[place],  [place] [address] ? ? ?CC:  Urinary retention ? ?Urological history  ?1. High risk hematuria ?- Former smoker ?- Incidental microscopic hematuria visit 09/1016 (3-10 RBC) with associated dysuria which has resolved.  Urine cytology 09/1016 negative ?- declined work up ?  ?2. Elevated PSA ?- negative prostate biopsy in 05/2011 by Dr. Bernardo Gallegos ?- PSA trend          ?Component Prostate Specific Ag, Serum  ?Latest Ref Rng & Units 0.0 - 4.0 ng/mL  ?2015 12.1  ?08/26/2017 5.5 (H)  ?12/28/2018 3.7  ? ?  ?3. BPH with LU TS ?- taking doxazosin 8 mg and finasteride 5 mg daily ?  ?4. Incomplete bladder emptying ?- PVR's ~200 cc ? ? ?HPI: ?Casey Gallegos is a 87 y.o.male who presents today for a TOV and PVR with his wife, Casey Gallegos.   ? ?His catheter was removed this morning and he is only been able to dribble.  He states he drank 3 bottles of water that are 16 ounces each.   ? ?PVR 704 cc ? ?His wife states he has been very uncomfortable with the catheter in place and he simply cannot tolerate another catheterization and she would like something done immediately. ? ?CATH UA 11-30 WBC's, 11-30 RBC's and many bacteria ? ?PMH: ?Past Medical History:  ?Diagnosis Date  ? A-fib ([place])   ? a.) CHA2DS2-VASc = 5 (age x2, HTN, DVT x2). b.) rate/rhythm maintained on oral carvedilol; chronically anticoagulated using daily warfarin.  ? Actinic keratosis   ? Anemia   ? BPH (benign prostatic hyperplasia)   ? DOE (dyspnea on exertion)   ? DVT (deep venous thrombosis) ([place])   ? Elevated PSA   ? H/O degenerative disc disease   ? HLD (hyperlipidemia)   ? Hypertension   ? Lipoma of arm   ? Lipomatosis   ? Long term current use of anticoagulant   ? a.) warfarin  ? NICM (nonischemic cardiomyopathy) ([place]) 11/22/2015  ? a.) TTE 11/22/2015: EF 40%. b.) TTE 12/27/2018: EF 45%. c.) TTE 08/30/2020: EF 45-50%.  ? Night sweats    ? Obesity   ? Osteoarthritis   ? RBBB (right bundle branch block)   ? Right inguinal hernia   ? VHD (valvular heart disease) 11/22/2015  ? a.) TTE 11/22/2015: EF 40%; mod MR, mod-sev TR, triv PR; mod BAE, mild RV enlargement. b.) TTE 12/27/2018: EF 45%; mod MR, mod-sev TR, triv PR; mod BAE, mild RV enlargement. c.) TTE 08/30/2020: EF 45-50%; mild MR/TR.  ? ? ?Surgical History: ?Past Surgical History:  ?Procedure Laterality Date  ? Bilateral Radical Keratotomy Bilateral 1997  ? COLONOSCOPY WITH PROPOFOL N/A 01/11/2016  ? Procedure: COLONOSCOPY WITH PROPOFOL;  Surgeon: Casey Silvas, MD;  Location: [facility] ENDOSCOPY;  Service: Endoscopy;  Laterality: N/A; repeat 3 years, tubular adenoma  ? deep vein thrombosis    ? DENTAL SURGERY    ? Patient had implants  ? GUM SURGERY    ? L4 - S1 decompression Left   ? Left-sided  ? PROSTATE BIOPSY    ? TONSILLECTOMY AND ADENOIDECTOMY  age 26  ? XI ROBOTIC ASSISTED INGUINAL HERNIA REPAIR WITH MESH Right 10/21/2021  ? Procedure: XI ROBOTIC ASSISTED INGUINAL HERNIA REPAIR WITH MESH;  Surgeon: Casey Bacon, MD;  Location: [facility] ORS;  Service: General;  Laterality: Right;  ? ? ?Home Medications:  ?  Allergies as of 11/05/2021   ? ?   Reactions  ? Latex Swelling  ? Cefdinir Nausea Only  ? hypersensitivity to smell  ? Doxycycline   ? Sun sensitivity  ? Prednisone   ? Hallucinations   ? Sertraline Other (See Comments)  ? Hallucinations   ? ?  ? ?  ?Medication List  ?  ? ?  ? Accurate as of November 05, 2021 11:59 PM. If you have any questions, ask your nurse or doctor.  ?  ?  ? ?  ? ?amLODipine 5 MG tablet ?Commonly known as: NORVASC ?TAKE 1 TABLET EVERY DAY ?  ?benazepril 40 MG tablet ?Commonly known as: LOTENSIN ?TAKE 1 TABLET EVERY DAY ?  ?carvedilol 6.25 MG tablet ?Commonly known as: COREG ?TAKE 1 TABLET TWICE DAILY ?  ?Cortizone-10 Diabetics Skin 1 % lotion ?Generic drug: hydrocortisone ?Apply 1 application topically daily as needed for itching. ?  ?doxazosin 8 MG tablet ?Commonly known  as: CARDURA ?TAKE 1 TABLET EVERY DAY ?  ?finasteride 5 MG tablet ?Commonly known as: PROSCAR ?TAKE 1 TABLET EVERY DAY ?  ?gabapentin 300 MG capsule ?Commonly known as: NEURONTIN ?TAKE 1 CAPSULE EVERY MORNING, 1 CAPSULE IN THE AFTERNOON AND 2 CAPSULES AT BEDTIME ?  ?ibuprofen 800 MG tablet ?Commonly known as: ADVIL ?Take 1 tablet (800 mg total) by mouth every 8 (eight) hours as needed. ?  ?Magnesium 500 MG Tabs ?Take 500 mg by mouth daily. ?  ?Multi-Vitamin tablet ?Take 1 tablet by mouth daily. ?  ?nitrofurantoin (macrocrystal-monohydrate) 100 MG capsule ?Commonly known as: MACROBID ?Take 1 capsule (100 mg total) by mouth every 12 (twelve) hours. ?Started by: Casey Council, PA-C ?  ?oxyCODONE 5 MG immediate release tablet ?Commonly known as: Oxy IR/ROXICODONE ?1/2 pill to 1 pill daily as needed for severe pain. ?What changed:  ?how much to take ?how to take this ?when to take this ?reasons to take this ?additional instructions ?  ?oxyCODONE 5 MG immediate release tablet ?Commonly known as: Oxy IR/ROXICODONE ?Take 1 tablet (5 mg total) by mouth every 6 (six) hours as needed for severe pain. ?What changed: Another medication with the same name was changed. Make sure you understand how and when to take each. ?  ?triamterene-hydrochlorothiazide 37.5-25 MG tablet ?Commonly known as: MAXZIDE-25 ?TAKE 1/2 TABLET EVERY DAY ?  ?VITAMIN B 12 PO ?Take by mouth. ?  ?warfarin 5 MG tablet ?Commonly known as: COUMADIN ?Take as directed by the anticoagulation clinic. If you are unsure how to take this medication, talk to your nurse or doctor. ?Original instructions: TAKE 1 TABLET EVERY DAY  AT  6PM ?  ? ?  ? ? ?Allergies:  ?Allergies  ?Allergen Reactions  ? Latex Swelling  ? Cefdinir Nausea Only  ?  hypersensitivity to smell  ? Doxycycline   ?  Sun sensitivity  ? Prednisone   ?  Hallucinations   ? Sertraline Other (See Comments)  ?  Hallucinations   ? ? ?Family History: ?Family History  ?Problem Relation Age of Onset  ? Cancer  Mother   ?     secondary to cancinoma of the jaw from her dipping snuff.  ? Heart attack Father   ? CAD Father   ? Hypertension Sister   ? Diabetes Son   ?     Type I diabetes  ? Prostate cancer Neg Hx   ? Kidney cancer Neg Hx   ? ? ?Social History:  reports that he quit smoking about 50 years ago.  His smoking use included cigarettes. He has never been exposed to tobacco smoke. He has never used smokeless tobacco. He reports that he does not currently use alcohol. He reports that he does not use drugs. ? ? ?Physical Exam: ?Constitutional:  Well nourished. Alert and oriented, No acute distress. ?HEENT: [place] AT, mask in place trachea midline ?Cardiovascular: No clubbing, cyanosis, or edema. ?Respiratory: Normal respiratory effort, no increased work of breathing. ?GU: No CVA tenderness.  No bladder fullness or masses.  Patient's phallus with some edema.  Scrotum without lesions, cysts, rashes and/or edema.   ?Neurologic: Grossly intact, no focal deficits, moving all 4 extremities. ?Psychiatric: Normal mood and affect.  ? ?Laboratory Data: ?N/A ? ? ?Pertinent Imaging: ? 11/05/21 14:03  ?Scan Result 711mL  ? ?Simple Catheter Placement ?Due to urinary retention patient is present today for a foley cath placement.  Patient was cleaned and prepped in a sterile fashion with betadine. A 16 FR Coloplast coude foley catheter was inserted, urine return was noted  800 ml, urine was dark pink clear in color.  The balloon was filled with 10cc of sterile water.  A plug was placed.  Patient was also given a night bag to take home and was given instruction on how to change from plug to bag.  Patient was given instruction on proper catheter care.  Patient tolerated well, no complications were noted  ? ? ?Assessment & Plan:   ? ?1. Urinary retention ?-patient failed TOV x 2 ?-He is not wanting the catheter placed back in ?-I had a very frank conversation with both he and his wife stating that him leaving the office with a bladder scan of  over 700 cc and no catheter in place will most likely result in the visit to the emergency room tonight and/or further damage to the bladder and possible kidney damage as well ?-Attempted to instruct him a

## 2021-11-05 NOTE — Addendum Note (Signed)
Addended by: Zara Council A on: 11/05/2021 04:56 PM ? ? Modules accepted: Orders ? ?

## 2021-11-05 NOTE — Addendum Note (Signed)
Addended by: Evelina Bucy on: 11/05/2021 03:08 PM ? ? Modules accepted: Orders ? ?

## 2021-11-06 ENCOUNTER — Encounter: Payer: Self-pay | Admitting: Urology

## 2021-11-06 ENCOUNTER — Ambulatory Visit (INDEPENDENT_AMBULATORY_CARE_PROVIDER_SITE_OTHER): Payer: Medicare HMO

## 2021-11-06 DIAGNOSIS — I480 Paroxysmal atrial fibrillation: Secondary | ICD-10-CM | POA: Diagnosis not present

## 2021-11-06 LAB — POCT INR
INR: 2.7 (ref 2.0–3.0)
PT: 32.7

## 2021-11-06 NOTE — Addendum Note (Signed)
Addended by: Zara Council A on: 11/06/2021 07:57 AM ? ? Modules accepted: Level of Service ? ?

## 2021-11-06 NOTE — Patient Instructions (Signed)
Description   ?Continue 5mg  daily.  Recheck in 4 weeks. ?  ?  ?

## 2021-11-07 ENCOUNTER — Encounter: Payer: Self-pay | Admitting: Surgery

## 2021-11-07 ENCOUNTER — Other Ambulatory Visit: Payer: Self-pay

## 2021-11-07 ENCOUNTER — Ambulatory Visit (INDEPENDENT_AMBULATORY_CARE_PROVIDER_SITE_OTHER): Payer: Medicare HMO | Admitting: Surgery

## 2021-11-07 VITALS — BP 94/59 | HR 87 | Temp 97.9°F | Ht 69.0 in | Wt 167.0 lb

## 2021-11-07 LAB — URINALYSIS, COMPLETE
Bilirubin, UA: NEGATIVE
Glucose, UA: NEGATIVE
Ketones, UA: NEGATIVE
Nitrite, UA: NEGATIVE
Specific Gravity, UA: <1.005 — ABNORMAL LOW (ref 1.005–1.030)
Urobilinogen, Ur: 0.2 mg/dL (ref 0.2–1.0)
pH, UA: 7 (ref 5.0–7.5)

## 2021-11-07 LAB — MICROSCOPIC EXAMINATION

## 2021-11-07 NOTE — Patient Instructions (Signed)
If you have any concerns or questions, please feel free to call our office. Follow up as needed.  ? ? ?GENERAL POST-OPERATIVE ?PATIENT INSTRUCTIONS  ? ?WOUND CARE INSTRUCTIONS:  Keep a dry clean dressing on the wound if there is drainage. The initial bandage may be removed after 24 hours.  Once the wound has quit draining you may leave it open to air.  If clothing rubs against the wound or causes irritation and the wound is not draining you may cover it with a dry dressing during the daytime.  Try to keep the wound dry and avoid ointments on the wound unless directed to do so.  If the wound becomes bright red and painful or starts to drain infected material that is not clear, please contact your physician immediately.  If the wound is mildly pink and has a thick firm ridge underneath it, this is normal, and is referred to as a healing ridge.  This will resolve over the next 4-6 weeks. ? ?BATHING: ?You may shower if you have been informed of this by your surgeon. However, Please do not submerge in a tub, hot tub, or pool until incisions are completely sealed or have been told by your surgeon that you may do so. ? ?DIET:  You may eat any foods that you can tolerate.  It is a good idea to eat a high fiber diet and take in plenty of fluids to prevent constipation.  If you do become constipated you may want to take a mild laxative or take ducolax tablets on a daily basis until your bowel habits are regular.  Constipation can be very uncomfortable, along with straining, after recent surgery. ? ?ACTIVITY:  You are encouraged to cough and deep breath or use your incentive spirometer if you were given one, every 15-30 minutes when awake.  This will help prevent respiratory complications and low grade fevers post-operatively if you had a general anesthetic.  You may want to hug a pillow when coughing and sneezing to add additional support to the surgical area, if you had abdominal or chest surgery, which will decrease pain  during these times.  You are encouraged to walk and engage in light activity for the next two weeks.  You should not lift, push or pull more than 15-20 pounds, until 12/08/2021 as it could put you at increased risk for complications.  Twenty pounds is roughly equivalent to a plastic bag of groceries. At that time- Listen to your body when lifting, if you have pain when lifting, stop and then try again in a few days. Soreness after doing exercises or activities of daily living is normal as you get back in to your normal routine. ? ?MEDICATIONS:  Try to take narcotic medications and anti-inflammatory medications, such as tylenol, ibuprofen, naprosyn, etc., with food.  This will minimize stomach upset from the medication.  Should you develop nausea and vomiting from the pain medication, or develop a rash, please discontinue the medication and contact your physician.  You should not drive, make important decisions, or operate machinery when taking narcotic pain medication. ? ?SUNBLOCK ?Use sun block to incision area over the next year if this area will be exposed to sun. This helps decrease scarring and will allow you avoid a permanent darkened area over your incision. ? ?QUESTIONS:  Please feel free to call our office if you have any questions, and we will be glad to assist you.  ? ? ?

## 2021-11-07 NOTE — Progress Notes (Signed)
[facility] ?POST-OP OFFICE VISIT ? ?11/07/2021 ? ?HPI: ?Casey Gallegos is an 87 y.o. male 17 days s/p robotic right inguinal hernia repair.  He is still unable to empty his bladder fully.  Currently in the process of obtaining a second urological opinion.  He had seen a PA previously and still has his Foley.  That is his only complaint.  He denies any nausea, vomiting, fevers or chills.  He denies any pain from the hernia repair, no groin pain, no incisional pain.  Reports he is eating well and having regular bowel activity. ? ?Vital signs: ?BP (!) 94/59   Pulse 87   Temp 97.9 ?F (36.6 ?C) (Oral)   Ht 5\' 9"  (1.753 m)   Wt 167 lb (75.8 kg)   SpO2 100%   BMI 24.66 kg/m?   ? ?Physical Exam: ?Constitutional: He appears well. ?Abdomen: Soft benign nontender nondistended.  No evidence of groin swelling or mass. ?Skin: Incisions are clean dry and intact. ? ?Assessment/Plan: ?This is a 87 y.o. male 17 days s/p robotic right inguinal hernia repair.  Urinary retention, currently seeking second opinion for urological consultation. ? ?Patient Active Problem List  ? Diagnosis Date Noted  ? Right inguinal hernia 10/16/2021  ? Inguinal bulge 10/07/2021  ? Muscle strain 09/10/2021  ? Cardiomyopathy ([place]) 12/30/2018  ? Chronic venous insufficiency 11/11/2017  ? Lymphedema 11/11/2017  ? Pain in limb 10/27/2017  ? A-fib ([place]) 03/02/2015  ? Deep vein thrombosis ([place]) 03/02/2015  ? Essential (primary) hypertension 03/02/2015  ? Combined fat and carbohydrate induced hyperlipemia 03/02/2015  ? Heart valve disease 03/02/2015  ? Allergic rhinitis 02/28/2015  ? Absolute anemia 02/28/2015  ? Benign fibroma of prostate 02/28/2015  ? Back pain, chronic 02/28/2015  ? Sex counseling 02/28/2015  ? Narrowing of intervertebral disc space 02/28/2015  ? Acute thromboembolism of deep veins of lower extremity ([place]) 02/28/2015  ? ED (erectile dysfunction) of organic origin 02/28/2015  ? Abnormal prostate specific antigen 02/28/2015   ? BP (high blood pressure) 02/28/2015  ? Lipomatosis 02/28/2015  ? Malaise and fatigue 02/28/2015  ? Generalized hyperhidrosis 02/28/2015  ? Arthritis, degenerative 02/28/2015  ? Adiposity 02/28/2015  ? AF (paroxysmal atrial fibrillation) ([place]) 02/28/2015  ? Chronic prostatitis 02/28/2015  ? Spinal stenosis 02/28/2015  ? Atypical pneumonia 02/28/2015  ? ? -Regarding his hernia he may follow-up with [place] as needed.  We will be glad to assist him in any way necessary in the future. ? ? ?Ronny Bacon M.D., FACS ?11/07/2021, 9:12 AM ? ?

## 2021-11-08 ENCOUNTER — Other Ambulatory Visit: Payer: Self-pay

## 2021-11-08 ENCOUNTER — Ambulatory Visit: Payer: Medicare HMO | Admitting: Urology

## 2021-11-08 ENCOUNTER — Encounter: Payer: Self-pay | Admitting: Urology

## 2021-11-08 VITALS — BP 136/78 | HR 89 | Ht 69.0 in | Wt 163.0 lb

## 2021-11-08 DIAGNOSIS — R338 Other retention of urine: Secondary | ICD-10-CM

## 2021-11-09 ENCOUNTER — Encounter: Payer: Self-pay | Admitting: Urology

## 2021-11-09 NOTE — Progress Notes (Signed)
? ?  11/09/21 ? ?CC:  ?Chief Complaint  ?Patient presents with  ? Cysto  ? ? ?HPI: ?Postop urinary retention after hernia repair 10/21/2021 ?History of BPH with incomplete bladder emptying; PVR ~ 200 mL ?Voiding at baseline preop ?Has failed voiding trials x2 ? ?Blood pressure 136/78, pulse 89, height 5\' 9"  (1.753 m), weight 163 lb (73.9 kg). ?NED. A&Ox3.   ?No respiratory distress   ?Abd soft, NT, ND ?Normal phallus with bilateral descended testicles ? ?Cystoscopy Procedure Note ? ?Patient identification was confirmed, informed consent was obtained, and patient was prepped using Betadine solution.  Lidocaine jelly was administered per urethral meatus.   ? ? ?Pre-Procedure: ?- Inspection reveals a normal caliber urethral meatus. ? ?Procedure: ?The flexible cystoscope was introduced without difficulty ?- No urethral strictures/lesions are present. ?-  Coapting lateral lobes  prostate  ?- Elevated bladder neck ?- Bilateral ureteral orifices identified ?- Bladder mucosa  reveals no ulcers, tumors, or lesions ?- Inflammatory changes secondary to indwelling Foley ?- No bladder stones ?- Moderate trabeculation ? ?Retroflexion shows intravesical lateral lobe extension ? ? ?Post-Procedure: ?- Patient tolerated the procedure well ? ?Assessment/ Plan: ?Postop urinary retention ?Marked BPH ?Catheter was replaced today and he desires a repeat voiding trial on Monday ?Last prostate volume determination was 2012 at 72 cc ?If he does have recurrent retention recommend TRUS for volume as endoscopically prostate size appears 100 g or greater ? ? ?Abbie Sons, MD ? ?

## 2021-11-11 ENCOUNTER — Encounter: Payer: Self-pay | Admitting: Urology

## 2021-11-11 ENCOUNTER — Ambulatory Visit (INDEPENDENT_AMBULATORY_CARE_PROVIDER_SITE_OTHER): Payer: Medicare HMO | Admitting: *Deleted

## 2021-11-11 ENCOUNTER — Ambulatory Visit: Payer: Medicare HMO | Admitting: Urology

## 2021-11-11 ENCOUNTER — Other Ambulatory Visit: Payer: Self-pay

## 2021-11-11 DIAGNOSIS — R338 Other retention of urine: Secondary | ICD-10-CM | POA: Diagnosis not present

## 2021-11-11 DIAGNOSIS — R339 Retention of urine, unspecified: Secondary | ICD-10-CM | POA: Diagnosis not present

## 2021-11-11 DIAGNOSIS — N401 Enlarged prostate with lower urinary tract symptoms: Secondary | ICD-10-CM | POA: Diagnosis not present

## 2021-11-11 DIAGNOSIS — N138 Other obstructive and reflux uropathy: Secondary | ICD-10-CM

## 2021-11-11 LAB — CULTURE, URINE COMPREHENSIVE

## 2021-11-11 LAB — BLADDER SCAN AMB NON-IMAGING

## 2021-11-11 NOTE — Progress Notes (Signed)
? ?11/11/2021 ?2:34 PM  ? ?Casey Gallegos ?[birth year] ?[id number] ? ?Referring provider: Jerrol Banana., MD ?[facility] ?[address] ?[place],  [place] [address] ? ?Chief Complaint  ?Patient presents with  ? Urinary Retention  ? ? ?HPI: ?Casey Gallegos presents for catheter removal/voiding trial.  Cystoscopy performed last week with significant BPH.  No recent imaging for prostate volume determination ? ? ?PMH: ?Past Medical History:  ?Diagnosis Date  ? A-fib ([place])   ? a.) CHA2DS2-VASc = 5 (age x2, HTN, DVT x2). b.) rate/rhythm maintained on oral carvedilol; chronically anticoagulated using daily warfarin.  ? Actinic keratosis   ? Anemia   ? BPH (benign prostatic hyperplasia)   ? DOE (dyspnea on exertion)   ? DVT (deep venous thrombosis) ([place])   ? Elevated PSA   ? H/O degenerative disc disease   ? HLD (hyperlipidemia)   ? Hypertension   ? Lipoma of arm   ? Lipomatosis   ? Long term current use of anticoagulant   ? a.) warfarin  ? NICM (nonischemic cardiomyopathy) ([place]) 11/22/2015  ? a.) TTE 11/22/2015: EF 40%. b.) TTE 12/27/2018: EF 45%. c.) TTE 08/30/2020: EF 45-50%.  ? Night sweats   ? Obesity   ? Osteoarthritis   ? RBBB (right bundle branch block)   ? Right inguinal hernia   ? VHD (valvular heart disease) 11/22/2015  ? a.) TTE 11/22/2015: EF 40%; mod MR, mod-sev TR, triv PR; mod BAE, mild RV enlargement. b.) TTE 12/27/2018: EF 45%; mod MR, mod-sev TR, triv PR; mod BAE, mild RV enlargement. c.) TTE 08/30/2020: EF 45-50%; mild MR/TR.  ? ? ?Surgical History: ?Past Surgical History:  ?Procedure Laterality Date  ? Bilateral Radical Keratotomy Bilateral 1997  ? COLONOSCOPY WITH PROPOFOL N/A 01/11/2016  ? Procedure: COLONOSCOPY WITH PROPOFOL;  Surgeon: Manya Silvas, MD;  Location: [facility] ENDOSCOPY;  Service: Endoscopy;  Laterality: N/A; repeat 3 years, tubular adenoma  ? deep vein thrombosis    ? DENTAL SURGERY    ? Patient had implants  ? GUM SURGERY    ? L4 - S1 decompression Left   ? Left-sided  ? PROSTATE BIOPSY    ?  TONSILLECTOMY AND ADENOIDECTOMY  age 87  ? XI ROBOTIC ASSISTED INGUINAL HERNIA REPAIR WITH MESH Right 10/21/2021  ? Procedure: XI ROBOTIC ASSISTED INGUINAL HERNIA REPAIR WITH MESH;  Surgeon: Ronny Bacon, MD;  Location: [facility] ORS;  Service: General;  Laterality: Right;  ? ? ?Home Medications:  ?Allergies as of 11/11/2021   ? ?   Reactions  ? Latex Swelling  ? Cefdinir Nausea Only  ? hypersensitivity to smell  ? Doxycycline   ? Sun sensitivity  ? Prednisone   ? Hallucinations   ? Sertraline Other (See Comments)  ? Hallucinations   ? ?  ? ?  ?Medication List  ?  ? ?  ? Accurate as of November 11, 2021  2:34 PM. If you have any questions, ask your nurse or doctor.  ?  ?  ? ?  ? ?amLODipine 5 MG tablet ?Commonly known as: NORVASC ?TAKE 1 TABLET EVERY DAY ?  ?benazepril 40 MG tablet ?Commonly known as: LOTENSIN ?TAKE 1 TABLET EVERY DAY ?  ?carvedilol 6.25 MG tablet ?Commonly known as: COREG ?TAKE 1 TABLET TWICE DAILY ?  ?Cortizone-10 Diabetics Skin 1 % lotion ?Generic drug: hydrocortisone ?Apply 1 application topically daily as needed for itching. ?  ?doxazosin 8 MG tablet ?Commonly known as: CARDURA ?TAKE 1 TABLET EVERY DAY ?  ?finasteride 5  MG tablet ?Commonly known as: PROSCAR ?TAKE 1 TABLET EVERY DAY ?  ?gabapentin 300 MG capsule ?Commonly known as: NEURONTIN ?TAKE 1 CAPSULE EVERY MORNING, 1 CAPSULE IN THE AFTERNOON AND 2 CAPSULES AT BEDTIME ?  ?ibuprofen 800 MG tablet ?Commonly known as: ADVIL ?Take 1 tablet (800 mg total) by mouth every 8 (eight) hours as needed. ?  ?Magnesium 500 MG Tabs ?Take 500 mg by mouth daily. ?  ?Multi-Vitamin tablet ?Take 1 tablet by mouth daily. ?  ?nitrofurantoin (macrocrystal-monohydrate) 100 MG capsule ?Commonly known as: MACROBID ?Take 1 capsule (100 mg total) by mouth every 12 (twelve) hours. ?  ?oxyCODONE 5 MG immediate release tablet ?Commonly known as: Oxy IR/ROXICODONE ?1/2 pill to 1 pill daily as needed for severe pain. ?What changed:  ?how much to take ?how to take this ?when to  take this ?reasons to take this ?additional instructions ?  ?oxyCODONE 5 MG immediate release tablet ?Commonly known as: Oxy IR/ROXICODONE ?Take 1 tablet (5 mg total) by mouth every 6 (six) hours as needed for severe pain. ?What changed: Another medication with the same name was changed. Make sure you understand how and when to take each. ?  ?triamterene-hydrochlorothiazide 37.5-25 MG tablet ?Commonly known as: MAXZIDE-25 ?TAKE 1/2 TABLET EVERY DAY ?  ?VITAMIN B 12 PO ?Take by mouth. ?  ?warfarin 5 MG tablet ?Commonly known as: COUMADIN ?Take as directed by the anticoagulation clinic. If you are unsure how to take this medication, talk to your nurse or doctor. ?Original instructions: TAKE 1 TABLET EVERY DAY  AT  6PM ?  ? ?  ? ? ?Allergies:  ?Allergies  ?Allergen Reactions  ? Latex Swelling  ? Cefdinir Nausea Only  ?  hypersensitivity to smell  ? Doxycycline   ?  Sun sensitivity  ? Prednisone   ?  Hallucinations   ? Sertraline Other (See Comments)  ?  Hallucinations   ? ? ?Family History: ?Family History  ?Problem Relation Age of Onset  ? Cancer Mother   ?     secondary to cancinoma of the jaw from her dipping snuff.  ? Heart attack Father   ? CAD Father   ? Hypertension Sister   ? Diabetes Son   ?     Type I diabetes  ? Prostate cancer Neg Hx   ? Kidney cancer Neg Hx   ? ? ?Social History:  reports that he quit smoking about 50 years ago. His smoking use included cigarettes. He has never been exposed to tobacco smoke. He has never used smokeless tobacco. He reports that he does not currently use alcohol. He reports that he does not use drugs. ? ? ?Physical Exam: ?Constitutional:  Alert and oriented, No acute distress. ?Psychiatric: Normal mood and affect. ? ? ?Assessment & Plan:   ? ?1.  BPH with urinary retention ?Catheter removed for voiding trial ?He returned this afternoon stating he has voided.  His bladder volume was 477 mL by bladder scan.  He attempted to void again with rescan at 463 mL ?I recommended  replacing his Foley catheter however he declined.  We discussed the likelihood that he may have to present to the ED this evening to have the Foley catheter replaced. ?Cystoscopy did show marked lateral lobe enlargement and no recent prostate volume determination ?Will schedule TRUS for volume with Dr. Erlene Quan and discuss HoLEP at that visit ? ? ?Abbie Sons, MD ? ?[facility] ?[address], [address] ?[place], [place] [address] ?[phone] ? ?

## 2021-11-11 NOTE — Progress Notes (Signed)
Catheter Removal ? ?Patient is present today for a catheter removal.  72ml of water was drained from the balloon. A 16 silconeFR foley cath was removed from the bladder no complications were noted . Patient tolerated well. ? ?Performed by: Gaspar Cola CMA ? ?Follow up/ Additional notes: This afternoon  ?

## 2021-11-11 NOTE — Progress Notes (Signed)
Patient to have a TRUS and discussed HOLP with Dr,Brandon . Patient states he don't want the cathter put back in .  ? ?

## 2021-11-12 ENCOUNTER — Telehealth: Payer: Self-pay | Admitting: Family Medicine

## 2021-11-12 ENCOUNTER — Telehealth: Payer: Self-pay | Admitting: *Deleted

## 2021-11-12 MED ORDER — SULFAMETHOXAZOLE-TRIMETHOPRIM 800-160 MG PO TABS: 1.0000 | ORAL_TABLET | Freq: Two times a day (BID) | ORAL | 0 refills | Status: AC

## 2021-11-12 NOTE — Telephone Encounter (Signed)
-----   Message from Nori Riis, PA-C sent at 11/11/2021  8:35 PM EDT ----- ?Please let Mr. Cantara know that his urine culture was positive for infection and we need to switch to Septra DS, twice daily for seven days.  ?

## 2021-11-12 NOTE — Telephone Encounter (Signed)
Spoke with patient and advised results rx sent to pharmacy by e-script  

## 2021-11-12 NOTE — Telephone Encounter (Signed)
Copied from [place] [id number]. Topic: Medicare AWV ?>> Nov 12, 2021 10:03 AM Cher Nakai R wrote: ?Reason for CRM:  ?Left message for patient to call back and schedule Medicare Annual Wellness Visit (AWV) in office.  ? ?If not able to come in office, please offer to do virtually or by telephone.  ? ?Last AWV: 05/29/2020 ? ?Please schedule at anytime with [facility] Health Advisor. ? ?If any questions, please contact me at [phone] ?

## 2021-11-12 NOTE — Progress Notes (Signed)
? ?  11/13/2021 ?CC:  ?Chief Complaint  ?Patient presents with  ? Benign Prostatic Hypertrophy  ? ? ? ?HPI: ?Casey Gallegos is a 87 y.o. male with a personal history of high risk hematuria, elevated PSA, BPH with LUTS, and incomplete bladder emptying who present today for a cystoscopy to evaluate prostate anatomy and TRUS.  ? ?He had post-op urinary retention after hernia repair on 10/21/2021 with x2 failed voiding trials.  ? ?He underwent a cystoscopy on 11/08/2021 with Dr Bernardo Heater that revealed moderate trabeculation, coapting lateral lobed prostate, and inflammatory changes secondary to indwelling foley.  Foley catheter was replaced, he followed up on 11/11/2021 for a voiding trial and failed . His foley was recommended to be replaced but he declined.  ? ?He reports that over the past couple of days, his urinary status has returned back to his previous baseline.  He is very pleased.  He is not sure if he wants to do anything different at this point in time. ? ?Vitals:  ? 11/13/21 1101  ?BP: 134/70  ?Pulse: 88  ?NED. A&Ox3.   ?No respiratory distress   ?Abd soft, NT, ND ?Normal phallus with bilateral descended testicles ? ? ?Prostate transrectal ultrasound sizing ?  ?Informed consent was obtained after discussing risks/benefits of the procedure.  A time out was performed to ensure correct patient identity. ?  ?Pre-Procedure: ?-Transrectal probe was placed without difficulty ?-Transrectal Ultrasound performed revealing a 89.65 gm prostate measuring 4.96 x 5.85 x 5.9 cm (length) ?-No significant hypoechoic or median lobe noted  ? ? ?Assessment/ Plan: ? ?History of urinary retention ?- States that he subjectively is feeling better after catheter removal and treatment for infection  ?- Will reassess symptoms in the near future  ? ?2. BPH with obstruction/lower urinary tract symptoms ?- He has improvement without catheter and feels that hew is emptying  ?- Cystoscopy on 11/11/2021 with Dr. Bernardo Heater  show marked lateral lobe  enlargement better ?- TRUS 89.65. ?- He is not interested in an outlet procedure as he states he feels he is doing significantly better ?- Continue medications  ? ?Return in 3 months with IPSS and PVR  ? ?I,Kailey Littlejohn,acting as a Education administrator for Hollice Espy, MD.,have documented all relevant documentation on the behalf of Hollice Espy, MD,as directed by  Hollice Espy, MD while in the presence of Hollice Espy, MD. ? ?I have reviewed the above documentation for accuracy and completeness, and I agree with the above.  ? ?Hollice Espy, MD ? ?

## 2021-11-13 ENCOUNTER — Other Ambulatory Visit: Payer: Self-pay

## 2021-11-13 ENCOUNTER — Encounter: Payer: Self-pay | Admitting: Urology

## 2021-11-13 ENCOUNTER — Ambulatory Visit (INDEPENDENT_AMBULATORY_CARE_PROVIDER_SITE_OTHER): Payer: Medicare HMO | Admitting: Urology

## 2021-11-13 VITALS — BP 134/70 | HR 88 | Ht 69.0 in | Wt 163.0 lb

## 2021-11-13 DIAGNOSIS — N401 Enlarged prostate with lower urinary tract symptoms: Secondary | ICD-10-CM | POA: Diagnosis not present

## 2021-11-13 DIAGNOSIS — R3914 Feeling of incomplete bladder emptying: Secondary | ICD-10-CM | POA: Diagnosis not present

## 2021-11-13 DIAGNOSIS — N138 Other obstructive and reflux uropathy: Secondary | ICD-10-CM

## 2021-11-13 DIAGNOSIS — Z87898 Personal history of other specified conditions: Secondary | ICD-10-CM

## 2021-11-18 DIAGNOSIS — M48062 Spinal stenosis, lumbar region with neurogenic claudication: Secondary | ICD-10-CM | POA: Diagnosis not present

## 2021-11-18 DIAGNOSIS — M5416 Radiculopathy, lumbar region: Secondary | ICD-10-CM | POA: Diagnosis not present

## 2021-11-18 DIAGNOSIS — M5136 Other intervertebral disc degeneration, lumbar region: Secondary | ICD-10-CM | POA: Diagnosis not present

## 2021-11-19 ENCOUNTER — Other Ambulatory Visit: Payer: Self-pay

## 2021-11-19 ENCOUNTER — Telehealth: Payer: Self-pay

## 2021-11-19 ENCOUNTER — Encounter: Payer: Self-pay | Admitting: Physician Assistant

## 2021-11-19 ENCOUNTER — Ambulatory Visit (INDEPENDENT_AMBULATORY_CARE_PROVIDER_SITE_OTHER): Payer: Medicare HMO | Admitting: Physician Assistant

## 2021-11-19 ENCOUNTER — Ambulatory Visit: Payer: Self-pay | Admitting: *Deleted

## 2021-11-19 VITALS — BP 123/68 | HR 78 | Temp 98.9°F

## 2021-11-19 DIAGNOSIS — J069 Acute upper respiratory infection, unspecified: Secondary | ICD-10-CM | POA: Diagnosis not present

## 2021-11-19 NOTE — Progress Notes (Signed)
?I,Sha'taria Tyson,acting as a Education administrator for [employer], PA-C.,have documented all relevant documentation on the behalf of Casey Kirschner, PA-C,as directed by  Casey Kirschner, PA-C while in the presence of Casey Kirschner, PA-C. ? ?Acute Office Visit ? ?Subjective:  ? ? Patient ID: Casey Gallegos, male    DOB: 01/07/1935, 87 y.o.   MRN: [id number] ? ? ?Casey Gallegos is an 87 y/o male who presents today with 4 days of sore throat, body aches, congestion, and fever of tmax 101.7 F at 7:00 am today. At the present moment he feels well, denies sore throat, nasal congestion, fatigue, body aches. Only has taken his prescribed oxycodone, has not taken tylenol. Denies nausea vomiting abdominal pain. At home COVID test negative. ? ?Reports a recent inguinal hernia surgery in February complicated by urinary retention and urinary infection. He has one day left of Bactrim from his urologist. Denies all urinary symptoms, no abdominal pain, frequency, urgency, retention, dysuria.  ? ?Past Medical History:  ?Diagnosis Date  ? A-fib ([place])   ? a.) CHA2DS2-VASc = 5 (age x2, HTN, DVT x2). b.) rate/rhythm maintained on oral carvedilol; chronically anticoagulated using daily warfarin.  ? Actinic keratosis   ? Anemia   ? BPH (benign prostatic hyperplasia)   ? DOE (dyspnea on exertion)   ? DVT (deep venous thrombosis) ([place])   ? Elevated PSA   ? H/O degenerative disc disease   ? HLD (hyperlipidemia)   ? Hypertension   ? Lipoma of arm   ? Lipomatosis   ? Long term current use of anticoagulant   ? a.) warfarin  ? NICM (nonischemic cardiomyopathy) ([place]) 11/22/2015  ? a.) TTE 11/22/2015: EF 40%. b.) TTE 12/27/2018: EF 45%. c.) TTE 08/30/2020: EF 45-50%.  ? Night sweats   ? Obesity   ? Osteoarthritis   ? RBBB (right bundle branch block)   ? Right inguinal hernia   ? VHD (valvular heart disease) 11/22/2015  ? a.) TTE 11/22/2015: EF 40%; mod MR, mod-sev TR, triv PR; mod BAE, mild RV enlargement. b.) TTE 12/27/2018: EF 45%; mod MR, mod-sev TR, triv PR; mod  BAE, mild RV enlargement. c.) TTE 08/30/2020: EF 45-50%; mild MR/TR.  ? ? ?Past Surgical History:  ?Procedure Laterality Date  ? Bilateral Radical Keratotomy Bilateral 1997  ? COLONOSCOPY WITH PROPOFOL N/A 01/11/2016  ? Procedure: COLONOSCOPY WITH PROPOFOL;  Surgeon: Manya Silvas, MD;  Location[facility] ENDOSCOPY;  Service: Endoscopy;  Laterality: N/A; repeat 3 years, tubular adenoma  ? deep vein thrombosis    ? DENTAL SURGERY    ? Patient had implants  ? GUM SURGERY    ? L4 - S1 decompression Left   ? Left-sided  ? PROSTATE BIOPSY    ? TONSILLECTOMY AND ADENOIDECTOMY  age 48  ? XI ROBOTIC ASSISTED INGUINAL HERNIA REPAIR WITH MESH Right 10/21/2021  ? Procedure: XI ROBOTIC ASSISTED INGUINAL HERNIA REPAIR WITH MESH;  Surgeon: Ronny Bacon, MD;  Location: [facility] ORS;  Service: General;  Laterality: Right;  ? ? ?Family History  ?Problem Relation Age of Onset  ? Cancer Mother   ?     secondary to cancinoma of the jaw from her dipping snuff.  ? Heart attack Father   ? CAD Father   ? Hypertension Sister   ? Diabetes Son   ?     Type I diabetes  ? Prostate cancer Neg Hx   ? Kidney cancer Neg Hx   ? ? ?Social History  ? ?Socioeconomic History  ? Marital status: Married  ?  Spouse name: Not on file  ? Number of children: 3  ? Years of education: Not on file  ? Highest education level: Associate degree: occupational, Hotel manager, or vocational program  ?Occupational History  ? Occupation: retired  ?Tobacco Use  ? Smoking status: Former  ?  Types: Cigarettes  ?  Quit date: 08/26/1971  ?  Years since quitting: 50.2  ?  Passive exposure: Never  ? Smokeless tobacco: Never  ? Tobacco comments:  ?  Smoked for about 20 years.  ?Vaping Use  ? Vaping Use: Never used  ?Substance and Sexual Activity  ? Alcohol use: Not Currently  ? Drug use: No  ? Sexual activity: Not on file  ?Other Topics Concern  ? Not on file  ?Social History Narrative  ? Not on file  ? ?Social Determinants of Health  ? ?Financial Resource Strain: Not on file  ?Food  Insecurity: Not on file  ?Transportation Needs: Not on file  ?Physical Activity: Not on file  ?Stress: Not on file  ?Social Connections: Not on file  ?Intimate Partner Violence: Not on file  ? ? ?Outpatient Medications Prior to Visit  ?Medication Sig Dispense Refill  ? amLODipine (NORVASC) 5 MG tablet TAKE 1 TABLET EVERY DAY 90 tablet 3  ? benazepril (LOTENSIN) 40 MG tablet TAKE 1 TABLET EVERY DAY 90 tablet 0  ? carvedilol (COREG) 6.25 MG tablet TAKE 1 TABLET TWICE DAILY 180 tablet 3  ? CORTIZONE-10 DIABETICS SKIN 1 % lotion Apply 1 application topically daily as needed for itching.    ? Cyanocobalamin (VITAMIN B 12 PO) Take by mouth.    ? doxazosin (CARDURA) 8 MG tablet TAKE 1 TABLET EVERY DAY 90 tablet 3  ? finasteride (PROSCAR) 5 MG tablet TAKE 1 TABLET EVERY DAY 90 tablet 3  ? gabapentin (NEURONTIN) 300 MG capsule TAKE 1 CAPSULE EVERY MORNING, 1 CAPSULE IN THE AFTERNOON AND 2 CAPSULES AT BEDTIME 360 capsule 3  ? ibuprofen (ADVIL) 800 MG tablet Take 1 tablet (800 mg total) by mouth every 8 (eight) hours as needed. 30 tablet 0  ? Magnesium 500 MG TABS Take 500 mg by mouth daily.    ? Multiple Vitamin (MULTI-VITAMIN) tablet Take 1 tablet by mouth daily.     ? oxyCODONE (OXY IR/ROXICODONE) 5 MG immediate release tablet 1/2 pill to 1 pill daily as needed for severe pain. (Patient taking differently: Take 2.5 mg by mouth every 6 (six) hours as needed for severe pain.) 90 tablet 0  ? oxyCODONE (OXY IR/ROXICODONE) 5 MG immediate release tablet Take 1 tablet (5 mg total) by mouth every 6 (six) hours as needed for severe pain. 15 tablet 0  ? sulfamethoxazole-trimethoprim (BACTRIM DS) 800-160 MG tablet Take 1 tablet by mouth 2 (two) times daily for 7 days. 14 tablet 0  ? triamterene-hydrochlorothiazide (MAXZIDE-25) 37.5-25 MG tablet TAKE 1/2 TABLET EVERY DAY 45 tablet 0  ? warfarin (COUMADIN) 5 MG tablet TAKE 1 TABLET EVERY DAY  AT  6PM 90 tablet 1  ? ?No facility-administered medications prior to visit.  ? ? ?Allergies   ?Allergen Reactions  ? Latex Swelling  ? Cefdinir Nausea Only  ?  hypersensitivity to smell  ? Doxycycline   ?  Sun sensitivity  ? Prednisone   ?  Hallucinations   ? Sertraline Other (See Comments)  ?  Hallucinations   ? ? ?Review of Systems  ?Constitutional:  Negative for fatigue and fever.  ?HENT:  Negative for congestion.   ?Respiratory:  Positive for cough.  Negative for shortness of breath.   ?Cardiovascular:  Negative for chest pain, palpitations and leg swelling.  ?Neurological:  Negative for dizziness and headaches.  ? ?   ?Objective:  ?  ?Physical Exam ?Constitutional:   ?   General: He is awake.  ?   Appearance: He is well-developed.  ?HENT:  ?   Head: Normocephalic.  ?   Right Ear: Tympanic membrane normal.  ?   Left Ear: Tympanic membrane normal.  ?   Nose: No congestion or rhinorrhea.  ?   Mouth/Throat:  ?   Pharynx: Posterior oropharyngeal erythema present. No oropharyngeal exudate.  ?   Tonsils: No tonsillar exudate.  ?Eyes:  ?   Conjunctiva/sclera: Conjunctivae normal.  ?Cardiovascular:  ?   Rate and Rhythm: Normal rate and regular rhythm.  ?   Heart sounds: Normal heart sounds.  ?Pulmonary:  ?   Effort: Pulmonary effort is normal.  ?   Breath sounds: No stridor. No wheezing, rhonchi or rales.  ?Lymphadenopathy:  ?   Cervical: No cervical adenopathy.  ?Skin: ?   General: Skin is warm.  ?Neurological:  ?   Mental Status: He is alert and oriented to person, place, and time.  ?Psychiatric:     ?   Attention and Perception: Attention normal.     ?   Mood and Affect: Mood normal.     ?   Speech: Speech normal.     ?   Behavior: Behavior is cooperative.  ? ? ?There were no vitals taken for this visit. ?Wt Readings from Last 3 Encounters:  ?11/13/21 163 lb (73.9 kg)  ?11/08/21 163 lb (73.9 kg)  ?11/07/21 167 lb (75.8 kg)  ? ? ?Health Maintenance Due  ?Topic Date Due  ? Zoster Vaccines- Shingrix (1 of 2) Never done  ? TETANUS/TDAP  10/26/2018  ? COVID-19 Vaccine (4 - Booster for Pfizer series) 07/20/2020   ? ? ?There are no preventive care reminders to display for this patient. ? ? ?Lab Results  ?Component Value Date  ? TSH 2.390 08/28/2021  ? ?Lab Results  ?Component Value Date  ? WBC 5.4 10/18/2021  ? HGB 11.7

## 2021-11-19 NOTE — Telephone Encounter (Signed)
I can see him at 1 in office  or Mendel Ryder can see him on televisit sometime today.

## 2021-11-19 NOTE — Telephone Encounter (Signed)
Appt made for today

## 2021-11-19 NOTE — Telephone Encounter (Signed)
?  Chief Complaint: fever ?Symptoms: fever, sore throat, chest congestion ?Frequency: 3 days ?Pertinent Negatives: Patient denies nausea/vomiting, negative COVID home test ?Disposition: [] ED /[] Urgent Care (no appt availability in office) / [] Appointment(In office/virtual)/ []  [facility]/ [] Home Care/ [x] Refused Recommended Disposition /[] [facility]/ []  Follow-up with PCP ?Additional Notes: Patient had wellness visit today- but canceled due to illness. Call to wife- patient is sick in the bed- did not want to come to office today- will do telephone visit- and may come tomorrow if appointment available. Wife states their daughter in law passed yesterday and she has got to help her son with funeral arrangements today- but she can be reached on her cell number. Encouraged treatment of fever and hydration- advised provider will want to see patient if we can get him to come to office- note sent for provider.  ?

## 2021-11-19 NOTE — Telephone Encounter (Signed)
Copied from [place] [id number]. Topic: General - Other ?>> Nov 19, 2021  8:17 AM McGill, Nelva Bush wrote: ?Reason for CRM: Pt would like to cancel upcoming AWV due to currently not feeling well. ?Please advise ?

## 2021-11-19 NOTE — Telephone Encounter (Signed)
Summary: fever 101.7,sore throat,congestion,pain on both legs and lower back,  ? Pt wife stated pt started with a sore throat 2 days ago pt now has a lot of congestion, pain on both legs and lower back, as well as a fever.   ?Pt took COVID test and it was negative.  ? ?Pt wife stated pt declined the appointment.   ? ?Seeking clinical advice   ?  ? ?Reason for Disposition ? [1] Fever > 101 F (38.3 C) AND [2] age > 60 years ? ?Answer Assessment - Initial Assessment Questions ?1. TEMPERATURE: "What is the most recent temperature?"  "How was it measured?"  ?    101.7- 7:00 am ?2. ONSET: "When did the fever start?"  ?    Last night- 101.6- 3 days ?3. CHILLS: "Do you have chills?" If yes: "How bad are they?"  (e.g., none, mild, moderate, severe) ?  - NONE: no chills ?  - MILD: feeling cold ?  - MODERATE: feeling very cold, some shivering (feels better under a thick blanket) ?  - SEVERE: feeling extremely cold with shaking chills (general body shaking, rigors; even under a thick blanket)  ?    unsure ?4. OTHER SYMPTOMS: "Do you have any other symptoms besides the fever?"  (e.g., abdomen pain, cough, diarrhea, earache, headache, sore throat, urination pain) ?    Sore throat, body aches ?5. CAUSE: If there are no symptoms, ask: "What do you think is causing the fever?"  ?    Unsure ?6. CONTACTS: "Does anyone else in the family have an infection?" ?    no ?7. TREATMENT: "What have you done so far to treat this fever?" (e.g., medications) ?    nothing ?8. IMMUNOCOMPROMISE: "Do you have of the following: diabetes, HIV positive, splenectomy, cancer chemotherapy, chronic steroid treatment, transplant patient, etc." ?    Recent surgery- February ?9. PREGNANCY: "Is there any chance you are pregnant?" "When was your last menstrual period?" ?    na ?10. TRAVEL: "Have you traveled out of the country in the last month?" (e.g., travel history, exposures) ?      no ? ?Protocols used: Fever-A-AH ? ?

## 2021-11-20 MED ORDER — OXYCODONE HCL 5 MG PO TABS: 5.0000 mg | ORAL_TABLET | Freq: Four times a day (QID) | ORAL | 0 refills | Status: DC | PRN

## 2021-12-04 ENCOUNTER — Other Ambulatory Visit: Payer: Self-pay

## 2021-12-04 ENCOUNTER — Ambulatory Visit (INDEPENDENT_AMBULATORY_CARE_PROVIDER_SITE_OTHER): Payer: Medicare HMO

## 2021-12-04 DIAGNOSIS — M5136 Other intervertebral disc degeneration, lumbar region: Secondary | ICD-10-CM

## 2021-12-04 DIAGNOSIS — I480 Paroxysmal atrial fibrillation: Secondary | ICD-10-CM

## 2021-12-04 LAB — POCT INR
INR: 5.4 — AB (ref 2.0–3.0)
PT: 64.5

## 2021-12-04 NOTE — Patient Instructions (Signed)
Description   ?Hold for 3 days then start 4mg  daily. Recheck in 2 weeks. ?  ? ? ?

## 2021-12-06 MED ORDER — OXYCODONE HCL 5 MG PO TABS: ORAL_TABLET | ORAL | 0 refills | Status: DC

## 2021-12-09 ENCOUNTER — Telehealth: Payer: Self-pay | Admitting: Family Medicine

## 2021-12-09 NOTE — Telephone Encounter (Signed)
Pharmacy is requesting changes to non delegated Rx ?

## 2021-12-09 NOTE — Telephone Encounter (Signed)
[facility] called about rx for oxyCODONE (OXY IR/ROXICODONE) 5 MG immediate release tablet / Ref# [id number]/ they stated it was sent for a 90 day supply but they only fill 30 day supplies for opioid / please resend and advise  ?

## 2021-12-11 NOTE — Telephone Encounter (Signed)
Pharmacy called back again to get the quantity changed for the Oxycodone 5 Mg RX / please advise / pts medicare plan wont cover 90 day supply  ?

## 2021-12-18 ENCOUNTER — Other Ambulatory Visit: Payer: Self-pay | Admitting: Family Medicine

## 2021-12-18 ENCOUNTER — Ambulatory Visit (INDEPENDENT_AMBULATORY_CARE_PROVIDER_SITE_OTHER): Payer: Medicare HMO

## 2021-12-18 VITALS — BP 126/70 | HR 54 | Ht 69.0 in | Wt 168.1 lb

## 2021-12-18 DIAGNOSIS — Z0000 Encounter for general adult medical examination without abnormal findings: Secondary | ICD-10-CM

## 2021-12-18 DIAGNOSIS — I480 Paroxysmal atrial fibrillation: Secondary | ICD-10-CM

## 2021-12-18 LAB — POCT INR
INR: 2.1 (ref 2.0–3.0)
PT: 25.8

## 2021-12-18 NOTE — Patient Instructions (Signed)
Casey Gallegos , ?Thank you for taking time to come for your Medicare Wellness Visit. I appreciate your ongoing commitment to your health goals. Please review the following plan we discussed and let me know if I can assist you in the future.  ? ?Screening recommendations/referrals: ?Colonoscopy: aged out ?Recommended yearly ophthalmology/optometry visit for glaucoma screening and checkup ?Recommended yearly dental visit for hygiene and checkup ? ?Vaccinations: ?Influenza vaccine: 07/03/21 ?Pneumococcal vaccine: 01/09/14 ?Tdap vaccine: 10/25/08, due  ?Shingles vaccine: n/d   ?Covid-19: 09/09/19, 09/30/19, 05/25/20 ? ?Advanced directives: no ? ?Conditions/risks identified: none ? ?Next appointment: Follow up in one year for your annual wellness visit. 12/23/22 @ 9:15am in person ? ?Preventive Care 42 Years and Older, Male ?Preventive care refers to lifestyle choices and visits with your health care provider that can promote health and wellness. ?What does preventive care include? ?A yearly physical exam. This is also called an annual well check. ?Dental exams once or twice a year. ?Routine eye exams. Ask your health care provider how often you should have your eyes checked. ?Personal lifestyle choices, including: ?Daily care of your teeth and gums. ?Regular physical activity. ?Eating a healthy diet. ?Avoiding tobacco and drug use. ?Limiting alcohol use. ?Practicing safe sex. ?Taking low doses of aspirin every day. ?Taking vitamin and mineral supplements as recommended by your health care provider. ?What happens during an annual well check? ?The services and screenings done by your health care provider during your annual well check will depend on your age, overall health, lifestyle risk factors, and family history of disease. ?Counseling  ?Your health care provider may ask you questions about your: ?Alcohol use. ?Tobacco use. ?Drug use. ?Emotional well-being. ?Home and relationship well-being. ?Sexual activity. ?Eating  habits. ?History of falls. ?Memory and ability to understand (cognition). ?Work and work Statistician. ?Screening  ?You may have the following tests or measurements: ?Height, weight, and BMI. ?Blood pressure. ?Lipid and cholesterol levels. These may be checked every 5 years, or more frequently if you are over 28 years old. ?Skin check. ?Lung cancer screening. You may have this screening every year starting at age 75 if you have a 30-pack-year history of smoking and currently smoke or have quit within the past 15 years. ?Fecal occult blood test (FOBT) of the stool. You may have this test every year starting at age 86. ?Flexible sigmoidoscopy or colonoscopy. You may have a sigmoidoscopy every 5 years or a colonoscopy every 10 years starting at age 73. ?Prostate cancer screening. Recommendations will vary depending on your family history and other risks. ?Hepatitis C blood test. ?Hepatitis B blood test. ?Sexually transmitted disease (STD) testing. ?Diabetes screening. This is done by checking your blood sugar (glucose) after you have not eaten for a while (fasting). You may have this done every 1-3 years. ?Abdominal aortic aneurysm (AAA) screening. You may need this if you are a current or former smoker. ?Osteoporosis. You may be screened starting at age 69 if you are at high risk. ?Talk with your health care provider about your test results, treatment options, and if necessary, the need for more tests. ?Vaccines  ?Your health care provider may recommend certain vaccines, such as: ?Influenza vaccine. This is recommended every year. ?Tetanus, diphtheria, and acellular pertussis (Tdap, Td) vaccine. You may need a Td booster every 10 years. ?Zoster vaccine. You may need this after age 65. ?Pneumococcal 13-valent conjugate (PCV13) vaccine. One dose is recommended after age 36. ?Pneumococcal polysaccharide (PPSV23) vaccine. One dose is recommended after age 19. ?Talk to your  health care provider about which screenings and  vaccines you need and how often you need them. ?This information is not intended to replace advice given to you by your health care provider. Make sure you discuss any questions you have with your health care provider. ?Document Released: 09/07/2015 Document Revised: 04/30/2016 Document Reviewed: 06/12/2015 ?Elsevier Interactive Patient Education ? 2017 [place]. ? ?Fall Prevention in the Home ?Falls can cause injuries. They can happen to people of all ages. There are many things you can do to make your home safe and to help prevent falls. ?What can I do on the outside of my home? ?Regularly fix the edges of walkways and driveways and fix any cracks. ?Remove anything that might make you trip as you walk through a door, such as a raised step or threshold. ?Trim any bushes or trees on the path to your home. ?Use bright outdoor lighting. ?Clear any walking paths of anything that might make someone trip, such as rocks or tools. ?Regularly check to see if handrails are loose or broken. Make sure that both sides of any steps have handrails. ?Any raised decks and porches should have guardrails on the edges. ?Have any leaves, snow, or ice cleared regularly. ?Use sand or salt on walking paths during winter. ?Clean up any spills in your garage right away. This includes oil or grease spills. ?What can I do in the bathroom? ?Use night lights. ?Install grab bars by the toilet and in the tub and shower. Do not use towel bars as grab bars. ?Use non-skid mats or decals in the tub or shower. ?If you need to sit down in the shower, use a plastic, non-slip stool. ?Keep the floor dry. Clean up any water that spills on the floor as soon as it happens. ?Remove soap buildup in the tub or shower regularly. ?Attach bath mats securely with double-sided non-slip rug tape. ?Do not have throw rugs and other things on the floor that can make you trip. ?What can I do in the bedroom? ?Use night lights. ?Make sure that you have a light by your  bed that is easy to reach. ?Do not use any sheets or blankets that are too big for your bed. They should not hang down onto the floor. ?Have a firm chair that has side arms. You can use this for support while you get dressed. ?Do not have throw rugs and other things on the floor that can make you trip. ?What can I do in the kitchen? ?Clean up any spills right away. ?Avoid walking on wet floors. ?Keep items that you use a lot in easy-to-reach places. ?If you need to reach something above you, use a strong step stool that has a grab bar. ?Keep electrical cords out of the way. ?Do not use floor polish or wax that makes floors slippery. If you must use wax, use non-skid floor wax. ?Do not have throw rugs and other things on the floor that can make you trip. ?What can I do with my stairs? ?Do not leave any items on the stairs. ?Make sure that there are handrails on both sides of the stairs and use them. Fix handrails that are broken or loose. Make sure that handrails are as long as the stairways. ?Check any carpeting to make sure that it is firmly attached to the stairs. Fix any carpet that is loose or worn. ?Avoid having throw rugs at the top or bottom of the stairs. If you do have throw rugs, attach them  to the floor with carpet tape. ?Make sure that you have a light switch at the top of the stairs and the bottom of the stairs. If you do not have them, ask someone to add them for you. ?What else can I do to help prevent falls? ?Wear shoes that: ?Do not have high heels. ?Have rubber bottoms. ?Are comfortable and fit you well. ?Are closed at the toe. Do not wear sandals. ?If you use a stepladder: ?Make sure that it is fully opened. Do not climb a closed stepladder. ?Make sure that both sides of the stepladder are locked into place. ?Ask someone to hold it for you, if possible. ?Clearly mark and make sure that you can see: ?Any grab bars or handrails. ?First and last steps. ?Where the edge of each step is. ?Use tools that  help you move around (mobility aids) if they are needed. These include: ?Canes. ?Walkers. ?Scooters. ?Crutches. ?Turn on the lights when you go into a dark area. Replace any light bulbs as soon as they burn out. ?Se

## 2021-12-18 NOTE — Progress Notes (Signed)
Subjective:   Casey Gallegos is a 87 y.o. male who presents for Medicare Annual/Subsequent preventive examination.  Review of Systems           Objective:    Today's Vitals   12/18/21 0928  BP: 126/70  Pulse: (!) 54  SpO2: 100%  Weight: 168 lb 1.6 oz (76.2 kg)  Height: 5\' 9"  (1.753 m)   Body mass index is 24.82 kg/m.     10/21/2021   12:09 PM 10/18/2021    8:39 AM 05/29/2020    9:06 AM 03/16/2019    8:44 AM 03/10/2018    9:16 AM 01/13/2017    9:09 AM 04/13/2016    8:16 AM  Advanced Directives  Does Patient Have a Medical Advance Directive? Yes Yes Yes Yes Yes Yes No  Type of Paramedic of [place];Living will Living will [place];Living will [place];Living will [place];Living will Living will;Healthcare Power of Attorney   Does patient want to make changes to medical advance directive? No - Patient declined        Copy of [place] in Chart? No - copy requested  Yes - validated most recent copy scanned in chart (See row information) No - copy requested No - copy requested No - copy requested   Would patient like information on creating a medical advance directive?   No - Patient declined    Yes - Educational materials given    Current Medications (verified) Outpatient Encounter Medications as of 12/18/2021  Medication Sig   amLODipine (NORVASC) 5 MG tablet TAKE 1 TABLET EVERY DAY   benazepril (LOTENSIN) 40 MG tablet TAKE 1 TABLET EVERY DAY   carvedilol (COREG) 6.25 MG tablet TAKE 1 TABLET TWICE DAILY   CORTIZONE-10 DIABETICS SKIN 1 % lotion Apply 1 application topically daily as needed for itching.   Cyanocobalamin (VITAMIN B 12 PO) Take by mouth.   doxazosin (CARDURA) 8 MG tablet TAKE 1 TABLET EVERY DAY   finasteride (PROSCAR) 5 MG tablet TAKE 1 TABLET EVERY DAY   gabapentin (NEURONTIN) 300 MG capsule TAKE 1 CAPSULE EVERY MORNING, 1 CAPSULE IN THE AFTERNOON AND 2  CAPSULES AT BEDTIME   ibuprofen (ADVIL) 800 MG tablet Take 1 tablet (800 mg total) by mouth every 8 (eight) hours as needed.   Magnesium 500 MG TABS Take 500 mg by mouth daily.   Multiple Vitamin (MULTI-VITAMIN) tablet Take 1 tablet by mouth daily.    oxyCODONE (OXY IR/ROXICODONE) 5 MG immediate release tablet Take 1 tablet (5 mg total) by mouth every 6 (six) hours as needed for severe pain.   oxyCODONE (OXY IR/ROXICODONE) 5 MG immediate release tablet 1/2 pill to 1 pill daily as needed for severe pain.   triamterene-hydrochlorothiazide (MAXZIDE-25) 37.5-25 MG tablet TAKE 1/2 TABLET EVERY DAY   warfarin (COUMADIN) 5 MG tablet TAKE 1 TABLET EVERY DAY  AT  6PM   No facility-administered encounter medications on file as of 12/18/2021.    Allergies (verified) Latex, Cefdinir, Doxycycline, Prednisone, and Sertraline   History: Past Medical History:  Diagnosis Date   A-fib ([place])    a.) CHA2DS2-VASc = 5 (age x2, HTN, DVT x2). b.) rate/rhythm maintained on oral carvedilol; chronically anticoagulated using daily warfarin.   Actinic keratosis    Anemia    BPH (benign prostatic hyperplasia)    DOE (dyspnea on exertion)    DVT (deep venous thrombosis) (HCC)    Elevated PSA    H/O  degenerative disc disease    HLD (hyperlipidemia)    Hypertension    Lipoma of arm    Lipomatosis    Long term current use of anticoagulant    a.) warfarin   NICM (nonischemic cardiomyopathy) ([place]) 11/22/2015   a.) TTE 11/22/2015: EF 40%. b.) TTE 12/27/2018: EF 45%. c.) TTE 08/30/2020: EF 45-50%.   Night sweats    Obesity    Osteoarthritis    RBBB (right bundle branch block)    Right inguinal hernia    VHD (valvular heart disease) 11/22/2015   a.) TTE 11/22/2015: EF 40%; mod MR, mod-sev TR, triv PR; mod BAE, mild RV enlargement. b.) TTE 12/27/2018: EF 45%; mod MR, mod-sev TR, triv PR; mod BAE, mild RV enlargement. c.) TTE 08/30/2020: EF 45-50%; mild MR/TR.   Past Surgical History:  Procedure Laterality Date    Bilateral Radical Keratotomy Bilateral 1997   COLONOSCOPY WITH PROPOFOL N/A 01/11/2016   Procedure: COLONOSCOPY WITH PROPOFOL;  Surgeon: Manya Silvas, MD;  Location: [facility] ENDOSCOPY;  Service: Endoscopy;  Laterality: N/A; repeat 3 years, tubular adenoma   deep vein thrombosis     DENTAL SURGERY     Patient had implants   GUM SURGERY     L4 - S1 decompression Left    Left-sided   PROSTATE BIOPSY     TONSILLECTOMY AND ADENOIDECTOMY  age 86   XI ROBOTIC ASSISTED INGUINAL HERNIA REPAIR WITH MESH Right 10/21/2021   Procedure: XI ROBOTIC ASSISTED INGUINAL HERNIA REPAIR WITH MESH;  Surgeon: Ronny Bacon, MD;  Location: [facility] ORS;  Service: General;  Laterality: Right;   Family History  Problem Relation Age of Onset   Cancer Mother        secondary to cancinoma of the jaw from her dipping snuff.   Heart attack Father    CAD Father    Hypertension Sister    Diabetes Son        Type I diabetes   Prostate cancer Neg Hx    Kidney cancer Neg Hx    Social History   Socioeconomic History   Marital status: Married    Spouse name: Not on file   Number of children: 3   Years of education: Not on file   Highest education level: Associate degree: occupational, Hotel manager, or vocational program  Occupational History   Occupation: retired  Tobacco Use   Smoking status: Former    Types: Cigarettes    Quit date: 08/26/1971    Years since quitting: 50.3    Passive exposure: Never   Smokeless tobacco: Never   Tobacco comments:    Smoked for about 20 years.  Vaping Use   Vaping Use: Never used  Substance and Sexual Activity   Alcohol use: Not Currently   Drug use: No   Sexual activity: Not on file  Other Topics Concern   Not on file  Social History Narrative   Not on file   Social Determinants of Health   Financial Resource Strain: Not on file  Food Insecurity: Not on file  Transportation Needs: Not on file  Physical Activity: Not on file  Stress: Not on file  Social Connections:  Not on file    Tobacco Counseling Counseling given: Not Answered Tobacco comments: Smoked for about 20 years.   Clinical Intake:  Pre-visit preparation completed: Yes  Pain : No/denies pain     Nutritional Risks: None Diabetes: No  How often do you need to have someone help you when you read instructions, pamphlets,  or other written materials from your doctor or pharmacy?: 1 - Never  Diabetic?no  Interpreter Needed?: No  Information entered by :: Kirke Shaggy, LPN   Activities of Daily Living    10/21/2021   12:14 PM 10/18/2021    8:42 AM  In your present state of health, do you have any difficulty performing the following activities:  Hearing? 0   Vision? 0   Difficulty concentrating or making decisions? 0   Walking or climbing stairs? 0   Dressing or bathing? 0   Doing errands, shopping?  0    Patient Care Team: Jerrol Banana., MD as PCP - General (Family Medicine) Hollice Espy, MD as Consulting Physician (Urology) Corey Skains, MD as Consulting Physician (Cardiology) Leandrew Koyanagi, MD as Referring Physician (Ophthalmology) Ralene Bathe, MD (Dermatology)  Indicate any recent Medical Services you may have received from other than [facility] providers in the past year (date may be approximate).     Assessment:   This is a routine wellness examination for [place].  Hearing/Vision screen No results found.  Dietary issues and exercise activities discussed:     Goals Addressed   None    Depression Screen    10/07/2021    1:58 PM 09/10/2021   10:02 AM 05/29/2020    9:03 AM 03/16/2019    8:45 AM 03/10/2018    9:16 AM 01/13/2017    9:10 AM 04/15/2016    2:52 PM  PHQ 2/9 Scores  PHQ - 2 Score 2 2 0 0 2 2 1   PHQ- 9 Score 3 2   5 5 4     Fall Risk    10/07/2021    1:57 PM 09/10/2021   10:02 AM 05/29/2020    9:06 AM 03/16/2019    8:45 AM 12/28/2018    8:11 AM  Fall Risk   Falls in the past year? 0 0 0 0 0  Number falls in past yr: 0 0  0    Injury with Fall? 0 0 0    Risk for fall due to :  Impaired balance/gait     Follow up  Falls evaluation completed       FALL RISK PREVENTION PERTAINING TO THE HOME:  Any stairs in or around the home? No  If so, are there any without handrails? No  Home free of loose throw rugs in walkways, pet beds, electrical cords, etc? Yes  Adequate lighting in your home to reduce risk of falls? Yes   ASSISTIVE DEVICES UTILIZED TO PREVENT FALLS:  Life alert? No  Use of a cane, walker or w/c? No  Grab bars in the bathroom? Yes  Shower chair or bench in shower? No  Elevated toilet seat or a handicapped toilet? Yes   TIMED UP AND GO:  Was the test performed? Yes .  Length of time to ambulate 10 feet: 4 sec.   Gait steady and fast without use of assistive device  Cognitive Function:    04/15/2016    2:47 PM  MMSE - Mini Mental State Exam  Orientation to time 4  Orientation to Place 5  Registration 3  Attention/ Calculation 5  Recall 1  Language- name 2 objects 2  Language- repeat 1  Language- follow 3 step command 3  Language- read & follow direction 1  Write a sentence 1  Copy design 1  Total score 27        01/13/2017    9:14 AM  6CIT Screen  What Year? 0 points  What month? 0 points  What time? 0 points  Count back from 20 0 points  Months in reverse 0 points  Repeat phrase 2 points  Total Score 2 points    Immunizations Immunization History  Administered Date(s) Administered   Fluad Quad(high Dose 65+) 05/10/2019, 07/03/2021   Influenza, High Dose Seasonal PF 05/11/2015, 05/21/2016, 05/27/2017, 05/26/2018, 05/01/2020   PFIZER(Purple Top)SARS-COV-2 Vaccination 09/09/2019, 09/30/2019, 05/25/2020   Pneumococcal Conjugate-13 01/09/2014   Pneumococcal Polysaccharide-23 08/08/2004   Td 10/25/2008    TDAP status: Due, Education has been provided regarding the importance of this vaccine. Advised may receive this vaccine at local pharmacy or Health Dept. Aware to  provide a copy of the vaccination record if obtained from local pharmacy or Health Dept. Verbalized acceptance and understanding.  Flu Vaccine status: Up to date  Pneumococcal vaccine status: Up to date  Covid-19 vaccine status: Completed vaccines  Qualifies for Shingles Vaccine? Yes   Zostavax completed No   Shingrix Completed?: No.    Education has been provided regarding the importance of this vaccine. Patient has been advised to call insurance company to determine out of pocket expense if they have not yet received this vaccine. Advised may also receive vaccine at local pharmacy or Health Dept. Verbalized acceptance and understanding.  Screening Tests Health Maintenance  Topic Date Due   Zoster Vaccines- Shingrix (1 of 2) Never done   TETANUS/TDAP  10/26/2018   COVID-19 Vaccine (4 - Booster for Pfizer series) 07/20/2020   INFLUENZA VACCINE  03/25/2022   Pneumonia Vaccine 17+ Years old  Completed   HPV VACCINES  Aged Out    Health Maintenance  Health Maintenance Due  Topic Date Due   Zoster Vaccines- Shingrix (1 of 2) Never done   TETANUS/TDAP  10/26/2018   COVID-19 Vaccine (4 - Booster for Pfizer series) 07/20/2020    Colorectal cancer screening: No longer required.   Lung Cancer Screening: (Low Dose CT Chest recommended if Age 35-80 years, 30 pack-year currently smoking OR have quit w/in 15years.) does not qualify.   Additional Screening:  Hepatitis C Screening: does not qualify; Completed no  Vision Screening: Recommended annual ophthalmology exams for early detection of glaucoma and other disorders of the eye. Is the patient up to date with their annual eye exam?  Yes  Who is the provider or what is the name of the office in which the patient attends annual eye exams? [facility] If pt is not established with a provider, would they like to be referred to a provider to establish care? No .   Dental Screening: Recommended annual dental exams for proper oral  hygiene  Community Resource Referral / Chronic Care Management: CRR required this visit?  No   CCM required this visit?  No      Plan:     I have personally reviewed and noted the following in the patient's chart:   Medical and social history Use of alcohol, tobacco or illicit drugs  Current medications and supplements including opioid prescriptions. Patient is not currently taking opioid prescriptions. Functional ability and status Nutritional status Physical activity Advanced directives List of other physicians Hospitalizations, surgeries, and ER visits in previous 12 months Vitals Screenings to include cognitive, depression, and falls Referrals and appointments  In addition, I have reviewed and discussed with patient certain preventive protocols, quality metrics, and best practice recommendations. A written personalized care plan for preventive services as well as general preventive health recommendations were provided  to patient.     Dionisio David, LPN   [id number]   Nurse Notes: none

## 2021-12-18 NOTE — Patient Instructions (Signed)
Description   ?Continue 4 mg daily. Recheck in 3 weeks. ?  ?  ?

## 2021-12-23 ENCOUNTER — Other Ambulatory Visit: Payer: Self-pay | Admitting: Family Medicine

## 2021-12-23 DIAGNOSIS — I10 Essential (primary) hypertension: Secondary | ICD-10-CM

## 2021-12-24 NOTE — Telephone Encounter (Signed)
Requested Prescriptions  ?Pending Prescriptions Disp Refills  ?? triamterene-hydrochlorothiazide (MAXZIDE-25) 37.5-25 MG tablet [Pharmacy Med Name: TRIAMTERENE/HYDROCHLOROTHIAZIDE 37.5-25 MG Tablet] 45 tablet 0  ?  Sig: TAKE 1/2 TABLET EVERY DAY  ?  ? Cardiovascular: Diuretic Combos Passed - 12/23/2021  2:29 AM  ?  ?  Passed - K in normal range and within 180 days  ?  Potassium  ?Date Value Ref Range Status  ?10/18/2021 4.1 3.5 - 5.1 mmol/L Final  ?   ?  ?  Passed - Na in normal range and within 180 days  ?  Sodium  ?Date Value Ref Range Status  ?10/18/2021 139 135 - 145 mmol/L Final  ?08/28/2021 145 (H) 134 - 144 mmol/L Final  ?   ?  ?  Passed - Cr in normal range and within 180 days  ?  Creatinine, Ser  ?Date Value Ref Range Status  ?10/18/2021 1.05 0.61 - 1.24 mg/dL Final  ?   ?  ?  Passed - Last BP in normal range  ?  BP Readings from Last 1 Encounters:  ?12/18/21 126/70  ?   ?  ?  Passed - Valid encounter within last 6 months  ?  Recent Outpatient Visits   ?      ? 1 month ago Upper respiratory tract infection, unspecified type  ? [facility] Thedore Mins, Ria [place], PA-C  ? 2 months ago Inguinal bulge  ? [place], DO  ? 3 months ago AF (paroxysmal atrial fibrillation) ([place])  ? [place], DO  ? 7 months ago Essential hypertension  ? [facility] Jerrol Banana., MD  ? 1 year ago Chronic atrial fibrillation [facility])  ? [facility] Jerrol Banana., MD  ?  ?  ?Future Appointments   ?        ? In 1 month Hollice Espy, MD [place]  ? In 2 months Jerrol Banana., MD [facility]  ?  ? ?  ?  ?  ? ? ?

## 2021-12-25 ENCOUNTER — Ambulatory Visit: Payer: Medicare HMO | Admitting: Dermatology

## 2022-01-08 ENCOUNTER — Ambulatory Visit (INDEPENDENT_AMBULATORY_CARE_PROVIDER_SITE_OTHER): Payer: Medicare HMO

## 2022-01-08 DIAGNOSIS — I480 Paroxysmal atrial fibrillation: Secondary | ICD-10-CM

## 2022-01-08 LAB — POCT INR
INR: 2.2 (ref 2.0–3.0)
PT: 26.9

## 2022-01-08 MED ORDER — WARFARIN SODIUM 4 MG PO TABS
4.0000 mg | ORAL_TABLET | Freq: Every day | ORAL | 0 refills | Status: DC
Start: 2022-01-08 — End: 2022-03-25

## 2022-01-08 NOTE — Patient Instructions (Signed)
Description   ?Continue 4 mg daily. Recheck in 4 weeks. ?  ? ? ?

## 2022-02-05 ENCOUNTER — Ambulatory Visit (INDEPENDENT_AMBULATORY_CARE_PROVIDER_SITE_OTHER): Payer: Medicare HMO

## 2022-02-05 DIAGNOSIS — I480 Paroxysmal atrial fibrillation: Secondary | ICD-10-CM

## 2022-02-05 LAB — POCT INR
INR: 1.8 — AB (ref 2.0–3.0)
PT: 21.7
PT: 21.7

## 2022-02-05 NOTE — Patient Instructions (Signed)
Description   4 mg daily except 5 mg on M-W-F. Recheck in 3 weeks.

## 2022-02-12 ENCOUNTER — Ambulatory Visit: Payer: Medicare HMO | Admitting: Urology

## 2022-02-12 NOTE — Telephone Encounter (Signed)
Error

## 2022-02-20 ENCOUNTER — Encounter: Payer: Self-pay | Admitting: Family Medicine

## 2022-02-20 ENCOUNTER — Ambulatory Visit (INDEPENDENT_AMBULATORY_CARE_PROVIDER_SITE_OTHER): Payer: Medicare HMO | Admitting: Family Medicine

## 2022-02-20 VITALS — BP 127/81 | HR 74 | Temp 98.4°F | Resp 16 | Ht 69.0 in | Wt 157.3 lb

## 2022-02-20 DIAGNOSIS — R634 Abnormal weight loss: Secondary | ICD-10-CM

## 2022-02-20 DIAGNOSIS — M5441 Lumbago with sciatica, right side: Secondary | ICD-10-CM

## 2022-02-20 DIAGNOSIS — R1310 Dysphagia, unspecified: Secondary | ICD-10-CM | POA: Diagnosis not present

## 2022-02-20 DIAGNOSIS — G3184 Mild cognitive impairment, so stated: Secondary | ICD-10-CM | POA: Diagnosis not present

## 2022-02-20 DIAGNOSIS — M5442 Lumbago with sciatica, left side: Secondary | ICD-10-CM | POA: Diagnosis not present

## 2022-02-20 DIAGNOSIS — N189 Chronic kidney disease, unspecified: Secondary | ICD-10-CM | POA: Diagnosis not present

## 2022-02-20 DIAGNOSIS — I422 Other hypertrophic cardiomyopathy: Secondary | ICD-10-CM | POA: Diagnosis not present

## 2022-02-20 DIAGNOSIS — M5136 Other intervertebral disc degeneration, lumbar region: Secondary | ICD-10-CM | POA: Diagnosis not present

## 2022-02-20 DIAGNOSIS — I480 Paroxysmal atrial fibrillation: Secondary | ICD-10-CM | POA: Diagnosis not present

## 2022-02-20 DIAGNOSIS — G8929 Other chronic pain: Secondary | ICD-10-CM

## 2022-02-20 NOTE — Progress Notes (Signed)
I,[name],acting as a scribe for Casey Durie, MD.,have documented all relevant documentation on the behalf of Casey Durie, MD,as directed by  Casey Durie, MD while in the presence of Casey Durie, MD.    Established patient visit   [name]   DOB: 05/14/1935   87 y.o. Male  MRN: [id number] Visit Date: 02/20/2022  Today's healthcare provider: [name], MD   Chief Complaint  Patient presents with   Weight Loss   Subjective    HPI  Patient is an 87 year old male who presents today for evaluation of unintentional weight loss.  On 09/10/21 his weight was 178. He feels very fatigued. When I look back to over a year ago he was 188 pounds and is 157 today. Wife is with him today and is concerned about his irritability.  He became upset with me when I insisted we need to check his PHQ-9 but specifically his MMSE. In the weight loss he has occasional dysphagia with certain foods but is not consistent. He initially intended to lose weight feels he has lost too much.  Wt Readings from Last 3 Encounters:  02/20/22 157 lb 4.8 oz (71.4 kg)  12/18/21 168 lb 1.6 oz (76.2 kg)  11/13/21 163 lb (73.9 kg)     Medications: Outpatient Medications Prior to Visit  Medication Sig   amLODipine (NORVASC) 5 MG tablet TAKE 1 TABLET EVERY DAY   benazepril (LOTENSIN) 40 MG tablet TAKE 1 TABLET EVERY DAY   carvedilol (COREG) 6.25 MG tablet TAKE 1 TABLET TWICE DAILY   CORTIZONE-10 DIABETICS SKIN 1 % lotion Apply 1 application topically daily as needed for itching.   Cyanocobalamin (VITAMIN B 12 PO) Take by mouth.   doxazosin (CARDURA) 8 MG tablet TAKE 1 TABLET EVERY DAY   finasteride (PROSCAR) 5 MG tablet TAKE 1 TABLET EVERY DAY   gabapentin (NEURONTIN) 300 MG capsule TAKE 1 CAPSULE EVERY MORNING, 1 CAPSULE IN THE AFTERNOON AND 2 CAPSULES AT BEDTIME   ibuprofen (ADVIL) 800 MG tablet Take 1 tablet (800 mg total) by mouth every 8 (eight) hours as  needed.   Magnesium 500 MG TABS Take 500 mg by mouth daily.   Multiple Vitamin (MULTI-VITAMIN) tablet Take 1 tablet by mouth daily.    oxyCODONE (OXY IR/ROXICODONE) 5 MG immediate release tablet Take 1 tablet (5 mg total) by mouth every 6 (six) hours as needed for severe pain.   oxyCODONE (OXY IR/ROXICODONE) 5 MG immediate release tablet 1/2 pill to 1 pill daily as needed for severe pain.   triamterene-hydrochlorothiazide (MAXZIDE-25) 37.5-25 MG tablet TAKE 1/2 TABLET EVERY DAY   warfarin (COUMADIN) 4 MG tablet Take 1 tablet (4 mg total) by mouth daily in the afternoon.   No facility-administered medications prior to visit.    Review of Systems  Constitutional:  Positive for fatigue.  Respiratory:  Negative for chest tightness and shortness of breath.   Cardiovascular:  Positive for leg swelling. Negative for chest pain.  Gastrointestinal:  Positive for constipation. Negative for abdominal distention, abdominal pain, anal bleeding, diarrhea, nausea and vomiting.    Last thyroid functions Lab Results  Component Value Date   TSH 2.390 08/28/2021      02/20/2022    3:17 PM 04/15/2016    2:47 PM  MMSE - Mini Mental State Exam  Orientation to time 3 4  Orientation to Place 5 5  Registration 3 3  Attention/ Calculation 4 5  Recall 1  1  Language- name 2 objects 2 2  Language- repeat 1 1  Language- follow 3 step command 3 3  Language- read & follow direction 1 1  Write a sentence 1 1  Copy design 1 1  Total score 25 27        02/20/2022    3:20 PM 02/20/2022    2:31 PM 12/18/2021    9:32 AM 10/07/2021    1:58 PM 09/10/2021   10:02 AM  Depression screen PHQ 2/9  Decreased Interest 1 0 1 2 1   Down, Depressed, Hopeless 1 0 0 0 1  PHQ - 2 Score 2 0 1 2 2   Altered sleeping 0   0 0  Tired, decreased energy 1   0 0  Change in appetite 0   0 0  Feeling bad or failure about yourself  1   1 0  Trouble concentrating 0   0 0  Moving slowly or fidgety/restless 0   0 0  Suicidal  thoughts 0   0 0  PHQ-9 Score 4   3 2   Difficult doing work/chores Not difficult at all   Very difficult Not difficult at all       02/20/2022    3:19 PM  GAD 7 : Generalized Anxiety Score  Nervous, Anxious, on Edge 1  Control/stop worrying 2  Worry too much - different things 2  Trouble relaxing 2  Restless 1  Easily annoyed or irritable 1  Afraid - awful might happen 2  Total GAD 7 Score 11         Objective    BP (!) 217/81 (BP Location: Right Arm, Patient Position: Sitting, Cuff Size: Normal)   Pulse 74   Temp 98.4 F (36.9 C) (Oral)   Resp 16   Ht 5\' 9"  (1.753 m)   Wt 157 lb 4.8 oz (71.4 kg)   BMI 23.23 kg/m  BP Readings from Last 3 Encounters:  02/20/22 (!) 217/81  12/18/21 126/70  11/19/21 123/68   Wt Readings from Last 3 Encounters:  02/20/22 157 lb 4.8 oz (71.4 kg)  12/18/21 168 lb 1.6 oz (76.2 kg)  11/13/21 163 lb (73.9 kg)   Patient was incorrectly entered.  It is not 217/81 but 127/81  Physical Exam Vitals reviewed.  Constitutional:      General: He is not in acute distress.    Appearance: He is well-developed.  HENT:     Head: Normocephalic and atraumatic.     Right Ear: Hearing normal.     Left Ear: Hearing normal.     Nose: Nose normal.  Eyes:     General: Lids are normal. No scleral icterus.       Right eye: No discharge.        Left eye: No discharge.     Conjunctiva/sclera: Conjunctivae normal.  Cardiovascular:     Rate and Rhythm: Normal rate and regular rhythm.     Heart sounds: Normal heart sounds.  Pulmonary:     Effort: Pulmonary effort is normal. No respiratory distress.  Skin:    Findings: No lesion or rash.  Neurological:     General: No focal deficit present.     Mental Status: He is alert and oriented to person, place, and time.  Psychiatric:        Mood and Affect: Mood normal.        Speech: Speech normal.        Behavior: Behavior normal.  Thought Content: Thought content normal.        Judgment: Judgment  normal.   MSE 25/30 PHQ-9 is 4 D7 is 11  -.  MCI No results found for any visits on 02/20/22.  Assessment & Plan     1. Weight loss Continue to monitor weight and I will see him back in 2 months.  Time if he has not the labs we will obtain lab work to work this up.  Would also start with a GI referral due to his dysphagia which is intermittent and mild. I think the patient is under an extraordinary amount of stress as his son is in failing health and in a wheelchair and he has to do a lot of things for him.  Also in failing health. 2. Paroxysmal atrial fibrillation (HCC) On long-term anticoagulation  3. DDD (degenerative disc disease), lumbar Followed by physiatry  4. Chronic kidney disease, unspecified CKD stage Labs on next visit.  We distended the spring  5. 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 Dysphagia, unspecified type May Benefit from PPI 7.  MCI  No follow-ups on file.      I, Casey Durie, MD, have reviewed all documentation for this visit. The documentation on 02/22/22 for the exam, diagnosis, procedures, and orders are all accurate and complete.    Sun Wilensky Cranford Mon, MD  [facility] [phone] (phone) [phone] (fax)  [place]

## 2022-02-26 ENCOUNTER — Ambulatory Visit (INDEPENDENT_AMBULATORY_CARE_PROVIDER_SITE_OTHER): Payer: Medicare HMO

## 2022-02-26 DIAGNOSIS — I480 Paroxysmal atrial fibrillation: Secondary | ICD-10-CM

## 2022-02-26 LAB — POCT INR
INR: 1.9 — AB (ref 2.0–3.0)
PT: 22.8

## 2022-02-26 NOTE — Patient Instructions (Signed)
Description   5 mg daily except 4 mg on M-W-F. Recheck in 3 weeks.

## 2022-03-11 ENCOUNTER — Ambulatory Visit: Payer: Medicare HMO | Admitting: Family Medicine

## 2022-03-23 ENCOUNTER — Other Ambulatory Visit: Payer: Self-pay | Admitting: Family Medicine

## 2022-03-25 NOTE — Telephone Encounter (Signed)
Requested medications are due for refill today.  yes  Requested medications are on the active medications list.  yes  Last refill. 01/08/2022 #90 0 refills  Future visit scheduled.   yes  Notes to clinic.  Provider to review.    Requested Prescriptions  Pending Prescriptions Disp Refills   warfarin (COUMADIN) 4 MG tablet [Pharmacy Med Name: WARFARIN SODIUM 4 MG Tablet] 90 tablet 0    Sig: TAKE 1 TABLET EVERY DAY IN THE AFTERNOON     Hematology:  Anticoagulants - warfarin Failed - 03/23/2022  6:57 AM      Failed - Manual Review: If patient's warfarin is managed by Anti-Coag team, route request to them. If not, route request to the provider.      Failed - INR in normal range and within 30 days    INR  Date Value Ref Range Status  02/26/2022 1.9 (A) 2.0 - 3.0 Final  10/18/2021 2.5 (H) 0.8 - 1.2 Final    Comment:    (NOTE) INR goal varies based on device and disease states. Performed at [facility], [address] [place], [place] [address]          Failed - HCT in normal range and within 360 days    HCT  Date Value Ref Range Status  10/18/2021 35.8 (L) 39.0 - 52.0 % Final   Hematocrit  Date Value Ref Range Status  08/28/2021 36.8 (L) 37.5 - 51.0 % Final         Passed - Patient is not pregnant      Passed - Valid encounter within last 3 months    Recent Outpatient Visits           1 month ago Weight loss   [facility] Jerrol Banana., MD   4 months ago Upper respiratory tract infection, unspecified type   [facility] Thedore Mins, [place], PA-C   5 months ago Inguinal bulge   [place], DO   6 months ago AF (paroxysmal atrial fibrillation) [facility])   [facility] Myles Gip, DO   10 months ago Essential hypertension   [facility] Jerrol Banana., MD       Future Appointments             In 4 weeks Jerrol Banana., MD [facility], [place]   In 5 months [place], Ronda Fairly, MD [facility]

## 2022-03-26 ENCOUNTER — Ambulatory Visit (INDEPENDENT_AMBULATORY_CARE_PROVIDER_SITE_OTHER): Payer: Medicare HMO

## 2022-03-26 DIAGNOSIS — I480 Paroxysmal atrial fibrillation: Secondary | ICD-10-CM

## 2022-03-26 LAB — POCT INR
INR: 1.9 — AB (ref 2.0–3.0)
PT: 23.4

## 2022-03-26 NOTE — Patient Instructions (Signed)
Description   Dx: a fib Current coumadin dose: 4mg  QD except 5mg  Mon, Wed, Fri PT:23.4 INR: 1.9 Today's changes: 5mg  QD except 4mg  on Mon, Wed, Fri Recheck: 3 weeks

## 2022-04-10 ENCOUNTER — Other Ambulatory Visit: Payer: Self-pay | Admitting: Family Medicine

## 2022-04-10 DIAGNOSIS — M5136 Other intervertebral disc degeneration, lumbar region: Secondary | ICD-10-CM

## 2022-04-10 NOTE — Telephone Encounter (Signed)
Patient called requesting refill on Oxycodone 5 mg. To be sent to [facility].

## 2022-04-10 NOTE — Addendum Note (Signed)
Addended by: Julieta Bellini on: 04/10/2022 04:38 PM   Modules accepted: Orders

## 2022-04-11 MED ORDER — OXYCODONE HCL 5 MG PO TABS: ORAL_TABLET | ORAL | 0 refills | Status: DC

## 2022-04-15 ENCOUNTER — Telehealth: Payer: Self-pay | Admitting: Family Medicine

## 2022-04-15 NOTE — Telephone Encounter (Signed)
Mickel Baas pharmacist at [facility], is calling in regards to the Rx medication oxyCODONE (OXY IR/ROXICODONE) 5 MG immediate release tablet  Mickel Baas stated the Rx was received for a 90-day supply; they only ship a 30-day supply and want to know if this is okay.  Please advise.

## 2022-04-15 NOTE — Telephone Encounter (Signed)
Please advise 

## 2022-04-16 ENCOUNTER — Ambulatory Visit (INDEPENDENT_AMBULATORY_CARE_PROVIDER_SITE_OTHER): Payer: Medicare HMO

## 2022-04-16 DIAGNOSIS — I480 Paroxysmal atrial fibrillation: Secondary | ICD-10-CM

## 2022-04-16 LAB — POCT INR
INR: 2 (ref 2.0–3.0)
PT: 23.8

## 2022-04-16 NOTE — Patient Instructions (Signed)
Description   4 mg daily, except 5 mg on M, W, F F/U in 4 weeks

## 2022-04-17 NOTE — Telephone Encounter (Signed)
Does Rx go to [facility].?

## 2022-04-17 NOTE — Telephone Encounter (Signed)
Yes

## 2022-04-17 NOTE — Telephone Encounter (Signed)
Returned call to [employer]. The rx for oxycodone has to be a 30 day supply only. We can send rx's predated for future months, but not 90 day supply at one time. Please advise?

## 2022-04-22 ENCOUNTER — Other Ambulatory Visit: Payer: Self-pay | Admitting: Family Medicine

## 2022-04-22 ENCOUNTER — Ambulatory Visit
Admission: RE | Admit: 2022-04-22 | Discharge: 2022-04-22 | Disposition: A | Discharge status: Home / Self Care | Payer: Medicare HMO | Source: Ambulatory Visit | Attending: Family Medicine | Admitting: Family Medicine

## 2022-04-22 ENCOUNTER — Encounter: Payer: Self-pay | Admitting: Family Medicine

## 2022-04-22 ENCOUNTER — Ambulatory Visit (INDEPENDENT_AMBULATORY_CARE_PROVIDER_SITE_OTHER): Payer: Medicare HMO | Admitting: Family Medicine

## 2022-04-22 ENCOUNTER — Ambulatory Visit
Admission: RE | Admit: 2022-04-22 | Discharge: 2022-04-22 | Disposition: A | Discharge status: Home / Self Care | Payer: Medicare HMO | Attending: Family Medicine | Admitting: Family Medicine

## 2022-04-22 ENCOUNTER — Telehealth: Payer: Self-pay

## 2022-04-22 VITALS — BP 124/72 | HR 79 | Resp 16 | Wt 150.0 lb

## 2022-04-22 DIAGNOSIS — I480 Paroxysmal atrial fibrillation: Secondary | ICD-10-CM

## 2022-04-22 DIAGNOSIS — R1310 Dysphagia, unspecified: Secondary | ICD-10-CM | POA: Diagnosis not present

## 2022-04-22 DIAGNOSIS — I420 Dilated cardiomyopathy: Secondary | ICD-10-CM | POA: Diagnosis not present

## 2022-04-22 DIAGNOSIS — R634 Abnormal weight loss: Secondary | ICD-10-CM | POA: Insufficient documentation

## 2022-04-22 DIAGNOSIS — R051 Acute cough: Secondary | ICD-10-CM | POA: Diagnosis not present

## 2022-04-22 DIAGNOSIS — R059 Cough, unspecified: Secondary | ICD-10-CM | POA: Diagnosis not present

## 2022-04-22 DIAGNOSIS — R202 Paresthesia of skin: Secondary | ICD-10-CM

## 2022-04-22 DIAGNOSIS — M4800 Spinal stenosis, site unspecified: Secondary | ICD-10-CM

## 2022-04-22 DIAGNOSIS — M5136 Other intervertebral disc degeneration, lumbar region: Secondary | ICD-10-CM | POA: Diagnosis not present

## 2022-04-22 MED ORDER — OXYCODONE HCL 5 MG PO TABS: 5.0000 mg | ORAL_TABLET | Freq: Four times a day (QID) | ORAL | 0 refills | Status: DC | PRN

## 2022-04-22 MED ORDER — OMEPRAZOLE 20 MG PO CPDR: 20.0000 mg | DELAYED_RELEASE_CAPSULE | ORAL | 3 refills | Status: DC

## 2022-04-22 NOTE — Telephone Encounter (Signed)
FYI to the team    Copied from [place] [id number]. Topic: Appointment Scheduling - Scheduling Inquiry for Clinic >> Apr 22, 2022 10:08 AM Marcellus Scott wrote: Reason for CRM: FYI to office ------- Pt stated for about 2 weeks he hasn't been feeling well. (Pt denied chest pains/SOB) Today wife tested positive for COVID however, he tested negative. Pt will still be coming in for his appointment today.  I asked pt to please wear a mask.

## 2022-04-22 NOTE — Telephone Encounter (Signed)
noted 

## 2022-04-22 NOTE — Progress Notes (Signed)
Established patient visit  I,Casey Gallegos,acting as a scribe for [name], MD.,have documented all relevant documentation on the behalf of [name], MD,as directed by  [name], MD while in the presence of [name], MD.   [name]   DOB: 03/13/1935   87 y.o. Male  MRN: [id number] Visit Date: 04/22/2022  Today's healthcare provider: [name], MD   Chief Complaint  Patient presents with   Follow-up   Subjective    HPI  Follow up for Weight Loss:  The patient was last seen for this 2 months ago. Changes made at last visit include; Continue to monitor weight and I will see him back in 2 months. At that time if he has not the labs we will obtain lab work to work this up.  Would also start with a GI referral due to his dysphagia which iintermittent and mild. I think the patient is under an extraordinary amount of stress as his son is in failing health and in a wheelchair and he has to do a lot of things for him.  Also in failing health. Patient has a lot of responsibilities at age 20. He is very active at home the only complaints he has are the [place] and the noted weight loss. Denies any fever chills cough. ----------------------------------------------------------------------------------------- Patient has had sore throat for 2 weeks. Patient also had symptoms of lack of appetite, fatigue, and chills. Patient was exposed to covid by his wife. Patient tested negative. He has had a mild raspy voice with a mild cough Medications: Outpatient Medications Prior to Visit  Medication Sig   amLODipine (NORVASC) 5 MG tablet TAKE 1 TABLET EVERY DAY   benazepril (LOTENSIN) 40 MG tablet TAKE 1 TABLET EVERY DAY   carvedilol (COREG) 6.25 MG tablet TAKE 1 TABLET TWICE DAILY   CORTIZONE-10 DIABETICS SKIN 1 % lotion Apply 1 application topically daily as needed for itching.   Cyanocobalamin (VITAMIN B 12 PO) Take by mouth.   doxazosin  (CARDURA) 8 MG tablet TAKE 1 TABLET EVERY DAY   finasteride (PROSCAR) 5 MG tablet TAKE 1 TABLET EVERY DAY   gabapentin (NEURONTIN) 300 MG capsule TAKE 1 CAPSULE EVERY MORNING, 1 CAPSULE IN THE AFTERNOON AND 2 CAPSULES AT BEDTIME   ibuprofen (ADVIL) 800 MG tablet Take 1 tablet (800 mg total) by mouth every 8 (eight) hours as needed.   Magnesium 500 MG TABS Take 500 mg by mouth daily.   Multiple Vitamin (MULTI-VITAMIN) tablet Take 1 tablet by mouth daily.    oxyCODONE (OXY IR/ROXICODONE) 5 MG immediate release tablet Take 1 tablet (5 mg total) by mouth every 6 (six) hours as needed for severe pain.   oxyCODONE (OXY IR/ROXICODONE) 5 MG immediate release tablet 1/2 pill to 1 pill daily as needed for severe pain.   triamterene-hydrochlorothiazide (MAXZIDE-25) 37.5-25 MG tablet TAKE 1/2 TABLET EVERY DAY   warfarin (COUMADIN) 4 MG tablet TAKE 1 TABLET EVERY DAY IN THE AFTERNOON   No facility-administered medications prior to visit.    Review of Systems  Constitutional:  Negative for appetite change, chills and fever.  Respiratory:  Negative for chest tightness, shortness of breath and wheezing.   Cardiovascular:  Negative for chest pain and palpitations.  Gastrointestinal:  Negative for abdominal pain, nausea and vomiting.    Last thyroid functions Lab Results  Component Value Date   TSH 2.390 08/28/2021       Objective    There  were no vitals taken for this visit. BP Readings from Last 3 Encounters:  04/22/22 124/72  02/22/22 127/81  12/18/21 126/70   Wt Readings from Last 3 Encounters:  04/22/22 150 lb (68 kg)  02/20/22 157 lb 4.8 oz (71.4 kg)  12/18/21 168 lb 1.6 oz (76.2 kg)      Physical Exam Vitals reviewed.  Constitutional:      General: He is not in acute distress.    Appearance: He is well-developed.     Comments: Thin white male in no acute distress.  HENT:     Head: Normocephalic and atraumatic.     Right Ear: Hearing normal.     Left Ear: Hearing normal.      Nose: Nose normal.  Eyes:     General: Lids are normal. No scleral icterus.       Right eye: No discharge.        Left eye: No discharge.     Conjunctiva/sclera: Conjunctivae normal.  Cardiovascular:     Rate and Rhythm: Normal rate and regular rhythm.     Heart sounds: Normal heart sounds.  Pulmonary:     Effort: Pulmonary effort is normal. No respiratory distress.  Abdominal:     Palpations: Abdomen is soft.  Skin:    General: Skin is warm and dry.     Findings: No lesion or rash.  Neurological:     General: No focal deficit present.     Mental Status: He is alert and oriented to person, place, and time.  Psychiatric:        Mood and Affect: Mood normal.        Speech: Speech normal.        Behavior: Behavior normal.        Thought Content: Thought content normal.        Judgment: Judgment normal.       No results found for any visits on 04/22/22.  Assessment & Plan     1. Weight loss, unintentional Needs complete work-up for this weight loss at this point. The 1 thing I forgot ordered may need to be added as an PSA./HIV. - DG Chest 2 View - CBC w/Diff/Platelet - Comprehensive Metabolic Panel (CMET) - TSH - Ambulatory referral to Gastroenterology  2. Dysphagia, unspecified type The only real symptom the patient has with his weight loss. - omeprazole (PRILOSEC) 20 MG capsule; Take 1 capsule (20 mg total) by mouth every morning.  Dispense: 90 capsule; Refill: 3 - CBC w/Diff/Platelet - Comprehensive Metabolic Panel (CMET) - TSH - Ambulatory referral to Gastroenterology  3. Acute cough Wife With recent COVID. - DG Chest 2 View - CBC w/Diff/Platelet - Comprehensive Metabolic Panel (CMET) - TSH  4. Cardiomyopathy, dilated, nonischemic (HCC)   5. AF (paroxysmal atrial fibrillation) ([place])   6. Spinal stenosis, unspecified spinal region   7. DDD (degenerative disc disease), lumbar  - oxyCODONE (OXY IR/ROXICODONE) 5 MG immediate release tablet; Take 1  tablet (5 mg total) by mouth every 6 (six) hours as needed for severe pain.  Dispense: 90 tablet; Refill: 0 - oxyCODONE (OXY IR/ROXICODONE) 5 MG immediate release tablet; Take 1 tablet (5 mg total) by mouth every 6 (six) hours as needed for severe pain.  Dispense: 90 tablet; Refill: 0 - oxyCODONE (OXY IR/ROXICODONE) 5 MG immediate release tablet; Take 1 tablet (5 mg total) by mouth every 6 (six) hours as needed for severe pain.  Dispense: 90 tablet; Refill: 0 - CBC w/Diff/Platelet - Comprehensive Metabolic Panel (  CMET) - TSH    No follow-ups on file.      I, [name], MD, have reviewed all documentation for this visit. The documentation on 04/26/22 for the exam, diagnosis, procedures, and orders are all accurate and complete.    Lunna Vogelgesang Cranford Mon, MD  [facility] [phone] (phone) [phone] (fax)  [place]

## 2022-04-23 ENCOUNTER — Other Ambulatory Visit: Payer: Self-pay | Admitting: Family Medicine

## 2022-04-23 ENCOUNTER — Other Ambulatory Visit: Payer: Self-pay | Admitting: *Deleted

## 2022-04-23 ENCOUNTER — Telehealth: Payer: Self-pay

## 2022-04-23 DIAGNOSIS — R634 Abnormal weight loss: Secondary | ICD-10-CM

## 2022-04-23 DIAGNOSIS — R222 Localized swelling, mass and lump, trunk: Secondary | ICD-10-CM

## 2022-04-23 DIAGNOSIS — R591 Generalized enlarged lymph nodes: Secondary | ICD-10-CM

## 2022-04-23 LAB — CBC WITH DIFFERENTIAL/PLATELET
Basophils Absolute: 0.1 10*3/uL (ref 0.0–0.2)
Basos: 1 %
EOS (ABSOLUTE): 0.1 10*3/uL (ref 0.0–0.4)
Eos: 1 %
Hematocrit: 37.3 % — ABNORMAL LOW (ref 37.5–51.0)
Hemoglobin: 12.8 g/dL — ABNORMAL LOW (ref 13.0–17.7)
Immature Grans (Abs): 0 10*3/uL (ref 0.0–0.1)
Immature Granulocytes: 0 %
Lymphocytes Absolute: 1.5 10*3/uL (ref 0.7–3.1)
Lymphs: 20 %
MCH: 31.8 pg (ref 26.6–33.0)
MCHC: 34.3 g/dL (ref 31.5–35.7)
MCV: 93 fL (ref 79–97)
Monocytes Absolute: 0.9 10*3/uL (ref 0.1–0.9)
Monocytes: 12 %
Neutrophils Absolute: 5 10*3/uL (ref 1.4–7.0)
Neutrophils: 66 %
Platelets: 212 10*3/uL (ref 150–450)
RBC: 4.03 x10E6/uL — ABNORMAL LOW (ref 4.14–5.80)
RDW: 13.3 % (ref 11.6–15.4)
WBC: 7.6 10*3/uL (ref 3.4–10.8)

## 2022-04-23 LAB — COMPREHENSIVE METABOLIC PANEL
ALT: 8 IU/L (ref 0–44)
AST: 16 IU/L (ref 0–40)
Albumin/Globulin Ratio: 1.7 (ref 1.2–2.2)
Albumin: 3.8 g/dL (ref 3.7–4.7)
Alkaline Phosphatase: 79 IU/L (ref 44–121)
BUN/Creatinine Ratio: 15 (ref 10–24)
BUN: 18 mg/dL (ref 8–27)
Bilirubin Total: 1.1 mg/dL (ref 0.0–1.2)
CO2: 23 mmol/L (ref 20–29)
Calcium: 9.3 mg/dL (ref 8.6–10.2)
Chloride: 105 mmol/L (ref 96–106)
Creatinine, Ser: 1.21 mg/dL (ref 0.76–1.27)
Globulin, Total: 2.3 g/dL (ref 1.5–4.5)
Glucose: 100 mg/dL — ABNORMAL HIGH (ref 70–99)
Potassium: 4 mmol/L (ref 3.5–5.2)
Sodium: 141 mmol/L (ref 134–144)
Total Protein: 6.1 g/dL (ref 6.0–8.5)
eGFR: 59 mL/min/{1.73_m2} — ABNORMAL LOW (ref >59)

## 2022-04-23 LAB — TSH: TSH: 2.62 u[IU]/mL (ref 0.450–4.500)

## 2022-04-23 NOTE — Telephone Encounter (Signed)
Chest X-ray critical report. Report taken from [place] at [facility] radiology  Mass like density of the left perihilar and suspected additional mass in the central mediastinum recommend chest ct with contrast.  Full report in chart for review.

## 2022-04-23 NOTE — Telephone Encounter (Signed)
Copied from [place] [id number]. Topic: Appointment Scheduling - Scheduling Inquiry for Clinic >> Apr 23, 2022  2:50 PM Marcellus Scott wrote: Reason for CRM: Pt wife stated pt spoke with nurse and was advised a CT Scan is needed. Wife mentioned they did not know there is a Designer, industrial/product in [place]. Wife stated [facility] is an option if pt can get in sooner.  Pt wife is requesting a call back stated they are waiting for appointment details for CT Scan.

## 2022-04-24 ENCOUNTER — Ambulatory Visit
Admission: RE | Admit: 2022-04-24 | Discharge: 2022-04-24 | Disposition: A | Discharge status: Home / Self Care | Payer: Medicare HMO | Source: Ambulatory Visit | Attending: Family Medicine | Admitting: Family Medicine

## 2022-04-24 ENCOUNTER — Telehealth: Payer: Self-pay

## 2022-04-24 DIAGNOSIS — C3490 Malignant neoplasm of unspecified part of unspecified bronchus or lung: Secondary | ICD-10-CM

## 2022-04-24 DIAGNOSIS — R918 Other nonspecific abnormal finding of lung field: Secondary | ICD-10-CM

## 2022-04-24 DIAGNOSIS — R634 Abnormal weight loss: Secondary | ICD-10-CM | POA: Diagnosis not present

## 2022-04-24 DIAGNOSIS — K5730 Diverticulosis of large intestine without perforation or abscess without bleeding: Secondary | ICD-10-CM | POA: Diagnosis not present

## 2022-04-24 DIAGNOSIS — J90 Pleural effusion, not elsewhere classified: Secondary | ICD-10-CM | POA: Diagnosis not present

## 2022-04-24 DIAGNOSIS — N2889 Other specified disorders of kidney and ureter: Secondary | ICD-10-CM

## 2022-04-24 DIAGNOSIS — I700 Atherosclerosis of aorta: Secondary | ICD-10-CM | POA: Diagnosis not present

## 2022-04-24 DIAGNOSIS — N281 Cyst of kidney, acquired: Secondary | ICD-10-CM | POA: Diagnosis not present

## 2022-04-24 DIAGNOSIS — J9811 Atelectasis: Secondary | ICD-10-CM | POA: Diagnosis not present

## 2022-04-24 DIAGNOSIS — N400 Enlarged prostate without lower urinary tract symptoms: Secondary | ICD-10-CM | POA: Diagnosis not present

## 2022-04-24 HISTORY — DX: Malignant neoplasm of unspecified part of unspecified bronchus or lung: C34.90

## 2022-04-24 HISTORY — DX: Other nonspecific abnormal finding of lung field: R91.8

## 2022-04-24 HISTORY — DX: Other specified disorders of kidney and ureter: N28.89

## 2022-04-24 MED ORDER — IOPAMIDOL (ISOVUE-300) INJECTION 61%
100.0000 mL | Freq: Once | INTRAVENOUS | Status: AC | PRN
  Administered 2022-04-24: 100 mL via INTRAVENOUS

## 2022-04-24 NOTE — Telephone Encounter (Signed)
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 May contain confluent LEFT hilar lymph node. Abrupt cut off of anterior LEFT upper lobe bronchus with component of plugging by mucus or tumor.  Called [facility] and gave report of findings to [place]. Routing back to office high priority.

## 2022-04-24 NOTE — Telephone Encounter (Signed)
Returned call to patient's wife yesterday.

## 2022-04-25 ENCOUNTER — Telehealth: Payer: Self-pay

## 2022-04-25 ENCOUNTER — Other Ambulatory Visit: Payer: Self-pay | Admitting: *Deleted

## 2022-04-25 ENCOUNTER — Encounter: Payer: Self-pay | Admitting: *Deleted

## 2022-04-25 DIAGNOSIS — R918 Other nonspecific abnormal finding of lung field: Secondary | ICD-10-CM

## 2022-04-25 NOTE — Telephone Encounter (Signed)
Received epic secure chat from [facility] and Dr. Patsey Berthold. Patient will need consult after PET.  Will contact patient to schedule OV once PET has been scheduled.

## 2022-04-25 NOTE — Progress Notes (Signed)
Referral received from Dr. Rosanna Randy to follow up with patient for lung mass and lymphadenopathy found on recent CT scan. Per Dr. Janese Banks, would like for pt to be scheduled in the lung clinic on 9/8 and for pt to be scheduled for PET scan and consult with pulmonology. Orders placed. Phone call made to pt and spoke with his wife to review upcoming appt on 9/8 and recommendations for PET scan and pulmonary referral. Introduced to navigator services and contact info given. Encouraged to call with any questions or needs. Pt's wife verbalized understanding.

## 2022-04-26 ENCOUNTER — Other Ambulatory Visit: Payer: Self-pay | Admitting: Urology

## 2022-04-26 DIAGNOSIS — N138 Other obstructive and reflux uropathy: Secondary | ICD-10-CM

## 2022-04-26 MED ORDER — FINASTERIDE 5 MG PO TABS: 5.0000 mg | ORAL_TABLET | Freq: Every day | ORAL | 3 refills | Status: DC

## 2022-04-29 NOTE — Telephone Encounter (Signed)
Lm for patient to offer appt 05/13/2022

## 2022-04-30 ENCOUNTER — Other Ambulatory Visit: Payer: Self-pay | Admitting: Nurse Practitioner

## 2022-04-30 DIAGNOSIS — R9389 Abnormal findings on diagnostic imaging of other specified body structures: Secondary | ICD-10-CM | POA: Diagnosis not present

## 2022-04-30 DIAGNOSIS — R1310 Dysphagia, unspecified: Secondary | ICD-10-CM | POA: Diagnosis not present

## 2022-04-30 DIAGNOSIS — R634 Abnormal weight loss: Secondary | ICD-10-CM | POA: Diagnosis not present

## 2022-04-30 NOTE — Telephone Encounter (Signed)
Appt scheduled 05/13/2022 at 1:00. Will close encounter.

## 2022-05-02 ENCOUNTER — Encounter: Payer: Self-pay | Admitting: *Deleted

## 2022-05-02 ENCOUNTER — Inpatient Hospital Stay: Payer: Medicare HMO

## 2022-05-02 ENCOUNTER — Encounter: Payer: Self-pay | Admitting: Oncology

## 2022-05-02 ENCOUNTER — Inpatient Hospital Stay: Payer: Medicare HMO | Attending: Oncology | Admitting: Oncology

## 2022-05-02 VITALS — BP 154/86 | HR 63 | Temp 97.6°F | Resp 18 | Wt 152.6 lb

## 2022-05-02 DIAGNOSIS — R590 Localized enlarged lymph nodes: Secondary | ICD-10-CM

## 2022-05-02 DIAGNOSIS — N281 Cyst of kidney, acquired: Secondary | ICD-10-CM

## 2022-05-02 DIAGNOSIS — R599 Enlarged lymph nodes, unspecified: Secondary | ICD-10-CM | POA: Insufficient documentation

## 2022-05-02 DIAGNOSIS — R918 Other nonspecific abnormal finding of lung field: Secondary | ICD-10-CM

## 2022-05-02 DIAGNOSIS — N400 Enlarged prostate without lower urinary tract symptoms: Secondary | ICD-10-CM

## 2022-05-02 NOTE — Progress Notes (Signed)
Hematology/Oncology Consult note [facility] [phone] [phone]  Patient Care Team: Jerrol Banana., MD as PCP - General (Family Medicine) Hollice Espy, MD as Consulting Physician (Urology) Corey Skains, MD as Consulting Physician (Cardiology) Leandrew Koyanagi, MD as Referring Physician (Ophthalmology) Ralene Bathe, MD (Dermatology) Telford Nab, RN as Oncology Nurse Navigator   Name of the [name]  [id number]  1935/06/28    Reason for referral-lung mass   Referring physician-Dr. Rosanna Randy  Date of visit: 05/02/22   History of presenting illness- Patient is a 87 year old male underwent a CT chest abdomen and pelvis with contrast for symptoms of unintentional weight loss and difficulty swallowing.  He was found to have a left upper lobe lung mass measuring 5.5 x 3.7 x 2.8 cm.  It was confluent with the left hilar lymph node.  Enlarged AP window lymph node measuring 16 mm.  Enlarged left paratracheal lymph node.  Low-attenuation 7 mm lesion in the liver.  7 mm cyst in the uncinate process of the pancreas for which follow-up was not recommended.  Bilateral kidney cysts.  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  Prostatic enlargement.  Patient is here with his daughter today.  He lives with his wife and is independent of his ADLs and IADLs.  Reports about 50 pound weight loss in the last 3 to 4 months.  Denies any significant shortness of breath.  He smoked for about 20 years but quit smoking over 40 years ago.  States that he was not a heavy smoker in the past  ECOG PS- 1  Pain scale- 0   Review of systems- Review of Systems  Constitutional:  Positive for malaise/fatigue and weight loss. Negative for chills and fever.  HENT:  Negative for congestion, ear discharge and nosebleeds.   Eyes:  Negative for blurred vision.  Respiratory:  Negative for cough, hemoptysis, sputum  production, shortness of breath and wheezing.   Cardiovascular:  Negative for chest pain, palpitations, orthopnea and claudication.  Gastrointestinal:  Negative for abdominal pain, blood in stool, constipation, diarrhea, heartburn, melena, nausea and vomiting.  Genitourinary:  Negative for dysuria, flank pain, frequency, hematuria and urgency.  Musculoskeletal:  Negative for back pain, joint pain and myalgias.  Skin:  Negative for rash.  Neurological:  Negative for dizziness, tingling, focal weakness, seizures, weakness and headaches.  Endo/Heme/Allergies:  Does not bruise/bleed easily.  Psychiatric/Behavioral:  Negative for depression and suicidal ideas. The patient does not have insomnia.     Allergies  Allergen Reactions   Latex Swelling   Cefdinir Nausea Only    hypersensitivity to smell   Doxycycline     Sun sensitivity   Prednisone     Hallucinations    Sertraline Other (See Comments)    Hallucinations     Patient Active Problem List   Diagnosis Date Noted   Right inguinal hernia 10/16/2021   Inguinal bulge 10/07/2021   Muscle strain 09/10/2021   Cardiomyopathy ([place]) 12/30/2018   Chronic venous insufficiency 11/11/2017   Lymphedema 11/11/2017   Pain in limb 10/27/2017   Cardiomyopathy, dilated, nonischemic ([place]) 11/07/2015   A-fib ([place]) 03/02/2015   Deep vein thrombosis ([place]) 03/02/2015   Essential (primary) hypertension 03/02/2015   Combined fat and carbohydrate induced hyperlipemia 03/02/2015   Heart valve disease 03/02/2015   Allergic rhinitis 02/28/2015   Absolute anemia 02/28/2015   Benign fibroma of prostate 02/28/2015   Back pain, chronic 02/28/2015   Narrowing of  intervertebral disc space 02/28/2015   Acute thromboembolism of deep veins of lower extremity ([place]) 02/28/2015   ED (erectile dysfunction) of organic origin 02/28/2015   Abnormal prostate specific antigen 02/28/2015   BP (high blood pressure) 02/28/2015   Lipomatosis 02/28/2015   Malaise and  fatigue 02/28/2015   Generalized hyperhidrosis 02/28/2015   Arthritis, degenerative 02/28/2015   AF (paroxysmal atrial fibrillation) ([place]) 02/28/2015   Chronic prostatitis 02/28/2015   Spinal stenosis 02/28/2015   Atypical pneumonia 02/28/2015   Acute embolism and thrombosis of deep vein of lower extremity ([place]) 02/28/2015     Past Medical History:  Diagnosis Date   A-fib [facility])    a.) CHA2DS2-VASc = 5 (age x2, HTN, DVT x2). b.) rate/rhythm maintained on oral carvedilol; chronically anticoagulated using daily warfarin.   Actinic keratosis    Anemia    BPH (benign prostatic hyperplasia)    DOE (dyspnea on exertion)    DVT (deep venous thrombosis) ([facility])    Elevated PSA    H/O degenerative disc disease    HLD (hyperlipidemia)    Hypertension    Lipoma of arm    Lipomatosis    Long term current use of anticoagulant    a.) warfarin   NICM (nonischemic cardiomyopathy) ([place]) 11/22/2015   a.) TTE 11/22/2015: EF 40%. b.) TTE 12/27/2018: EF 45%. c.) TTE 08/30/2020: EF 45-50%.   Night sweats    Obesity    Osteoarthritis    RBBB (right bundle branch block)    Right inguinal hernia    VHD (valvular heart disease) 11/22/2015   a.) TTE 11/22/2015: EF 40%; mod MR, mod-sev TR, triv PR; mod BAE, mild RV enlargement. b.) TTE 12/27/2018: EF 45%; mod MR, mod-sev TR, triv PR; mod BAE, mild RV enlargement. c.) TTE 08/30/2020: EF 45-50%; mild MR/TR.     Past Surgical History:  Procedure Laterality Date   Bilateral Radical Keratotomy Bilateral 1997   COLONOSCOPY WITH PROPOFOL N/A 01/11/2016   Procedure: COLONOSCOPY WITH PROPOFOL;  Surgeon: Manya Silvas, MD;  Location: [facility] ENDOSCOPY;  Service: Endoscopy;  Laterality: N/A; repeat 3 years, tubular adenoma   deep vein thrombosis     DENTAL SURGERY     Patient had implants   GUM SURGERY     L4 - S1 decompression Left    Left-sided   PROSTATE BIOPSY     TONSILLECTOMY AND ADENOIDECTOMY  age 67   XI ROBOTIC ASSISTED INGUINAL HERNIA REPAIR WITH  MESH Right 10/21/2021   Procedure: XI ROBOTIC ASSISTED INGUINAL HERNIA REPAIR WITH MESH;  Surgeon: Ronny Bacon, MD;  Location: [facility] ORS;  Service: General;  Laterality: Right;    Social History   Socioeconomic History   Marital status: Married    Spouse name: Not on file   Number of children: 3   Years of education: Not on file   Highest education level: Associate degree: occupational, Hotel manager, or vocational program  Occupational History   Occupation: retired  Tobacco Use   Smoking status: Former    Types: Cigarettes    Quit date: 08/26/1971    Years since quitting: 50.7    Passive exposure: Never   Smokeless tobacco: Never   Tobacco comments:    Smoked for about 20 years.  Vaping Use   Vaping Use: Never used  Substance and Sexual Activity   Alcohol use: Not Currently   Drug use: No   Sexual activity: Not on file  Other Topics Concern   Not on file  Social History Narrative   Not  on file   Social Determinants of Health   Financial Resource Strain: Low Risk  (12/18/2021)   Overall Financial Resource Strain (CARDIA)    Difficulty of Paying Living Expenses: Not hard at all  Food Insecurity: No Food Insecurity (12/18/2021)   Hunger Vital Sign    Worried About Running Out of Food in the Last Year: Never true    Ran Out of Food in the Last Year: Never true  Transportation Needs: No Transportation Needs (12/18/2021)   PRAPARE - Hydrologist (Medical): No    Lack of Transportation (Non-Medical): No  Physical Activity: Sufficiently Active (12/18/2021)   Exercise Vital Sign    Days of Exercise per Week: 7 days    Minutes of Exercise per Session: 50 min  Stress: No Stress Concern Present (12/18/2021)   [place]    Feeling of Stress : Only a little  Social Connections: Moderately Integrated (12/18/2021)   Social Connection and Isolation Panel [NHANES]    Frequency of Communication  with Friends and Family: Twice a week    Frequency of Social Gatherings with Friends and Family: Once a week    Attends Religious Services: More than 4 times per year    Active Member of [employer] or Organizations: No    Attends Archivist Meetings: Never    Marital Status: Married  Human resources officer Violence: Not At Risk (12/18/2021)   Humiliation, Afraid, Rape, and Kick questionnaire    Fear of Current or Ex-Partner: No    Emotionally Abused: No    Physically Abused: No    Sexually Abused: No     Family History  Problem Relation Age of Onset   Cancer Mother        secondary to cancinoma of the jaw from her dipping snuff.   Heart attack Father    CAD Father    Hypertension Sister    Diabetes Son        Type I diabetes   Prostate cancer Neg Hx    Kidney cancer Neg Hx      Current Outpatient Medications:    amLODipine (NORVASC) 5 MG tablet, TAKE 1 TABLET EVERY DAY, Disp: 90 tablet, Rfl: 3   benazepril (LOTENSIN) 40 MG tablet, TAKE 1 TABLET EVERY DAY, Disp: 90 tablet, Rfl: 3   carvedilol (COREG) 6.25 MG tablet, TAKE 1 TABLET TWICE DAILY, Disp: 180 tablet, Rfl: 3   CORTIZONE-10 DIABETICS SKIN 1 % lotion, Apply 1 application topically daily as needed for itching., Disp: , Rfl:    Cyanocobalamin (VITAMIN B 12 PO), Take by mouth., Disp: , Rfl:    doxazosin (CARDURA) 8 MG tablet, TAKE 1 TABLET EVERY DAY, Disp: 90 tablet, Rfl: 3   finasteride (PROSCAR) 5 MG tablet, Take 1 tablet (5 mg total) by mouth daily., Disp: 90 tablet, Rfl: 3   gabapentin (NEURONTIN) 300 MG capsule, TAKE 1 CAPSULE EVERY MORNING, 1 CAPSULE IN THE AFTERNOON AND 2 CAPSULES AT BEDTIME, Disp: 360 capsule, Rfl: 3   ibuprofen (ADVIL) 800 MG tablet, Take 1 tablet (800 mg total) by mouth every 8 (eight) hours as needed., Disp: 30 tablet, Rfl: 0   Magnesium 500 MG TABS, Take 500 mg by mouth daily., Disp: , Rfl:    Multiple Vitamin (MULTI-VITAMIN) tablet, Take 1 tablet by mouth daily. , Disp: , Rfl:    omeprazole  (PRILOSEC) 20 MG capsule, Take 1 capsule (20 mg total) by mouth every morning., Disp: 90  capsule, Rfl: 3   oxyCODONE (OXY IR/ROXICODONE) 5 MG immediate release tablet, Take 1 tablet (5 mg total) by mouth every 6 (six) hours as needed for severe pain., Disp: 90 tablet, Rfl: 0   oxyCODONE (OXY IR/ROXICODONE) 5 MG immediate release tablet, Take 1 tablet (5 mg total) by mouth every 6 (six) hours as needed for severe pain., Disp: 90 tablet, Rfl: 0   oxyCODONE (OXY IR/ROXICODONE) 5 MG immediate release tablet, Take 1 tablet (5 mg total) by mouth every 6 (six) hours as needed for severe pain., Disp: 90 tablet, Rfl: 0   triamterene-hydrochlorothiazide (MAXZIDE-25) 37.5-25 MG tablet, TAKE 1/2 TABLET EVERY DAY, Disp: 45 tablet, Rfl: 0   warfarin (COUMADIN) 4 MG tablet, TAKE 1 TABLET EVERY DAY IN THE AFTERNOON, Disp: 90 tablet, Rfl: 0   Physical exam:  Vitals:   05/02/22 0941  BP: (!) 154/86  Pulse: 63  Resp: 18  Temp: 97.6 F (36.4 C)  SpO2: 100%  Weight: 152 lb 9.6 oz (69.2 kg)   Physical Exam Cardiovascular:     Rate and Rhythm: Normal rate and regular rhythm.     Heart sounds: Normal heart sounds.  Pulmonary:     Effort: Pulmonary effort is normal.     Breath sounds: Normal breath sounds.  Abdominal:     General: Bowel sounds are normal.     Palpations: Abdomen is soft.  Skin:    General: Skin is warm and dry.  Neurological:     Mental Status: He is alert and oriented to person, place, and time.           Latest Ref Rng & Units 04/22/2022    3:43 PM  CMP  Glucose 70 - 99 mg/dL 100   BUN 8 - 27 mg/dL 18   Creatinine 0.76 - 1.27 mg/dL 1.21   Sodium 134 - 144 mmol/L 141   Potassium 3.5 - 5.2 mmol/L 4.0   Chloride 96 - 106 mmol/L 105   CO2 20 - 29 mmol/L 23   Calcium 8.6 - 10.2 mg/dL 9.3   Total Protein 6.0 - 8.5 g/dL 6.1   Total Bilirubin 0.0 - 1.2 mg/dL 1.1   Alkaline Phos 44 - 121 IU/L 79   AST 0 - 40 IU/L 16   ALT 0 - 44 IU/L 8       Latest Ref Rng & Units 04/22/2022     3:43 PM  CBC  WBC 3.4 - 10.8 x10E3/uL 7.6   Hemoglobin 13.0 - 17.7 g/dL 12.8   Hematocrit 37.5 - 51.0 % 37.3   Platelets 150 - 450 x10E3/uL 212     No images are attached to the encounter.  CT CHEST ABDOMEN PELVIS W CONTRAST  Result Date: 04/24/2022 CLINICAL DATA:  Unintentional weight loss, mass/adenopathy, abnormal chest radiograph; history non ischemic cardiomyopathy, atrial fibrillation, hypertension EXAM: CT CHEST, ABDOMEN, AND PELVIS WITH CONTRAST TECHNIQUE: Multidetector CT imaging of the chest, abdomen and pelvis was performed following the standard protocol during bolus administration of intravenous contrast. RADIATION DOSE REDUCTION: This exam was performed according to the departmental dose-optimization program which includes automated exposure control, adjustment of the mA and/or kV according to patient size and/or use of iterative reconstruction technique. CONTRAST:  163mL ISOVUE-300 IOPAMIDOL (ISOVUE-300) INJECTION 61% IV. Dilute oral contrast. COMPARISON:  Chest radiograph 04/22/2022 FINDINGS: CT CHEST FINDINGS Cardiovascular: Atherosclerotic calcifications aorta and coronary arteries. Aorta normal caliber. Heart unremarkable. Trace pericardial fluid. Mediastinum/Nodes: Esophagus unremarkable. Tiny RIGHT thyroid nodule 6 mm diameter; Not clinically significant; no  follow-up imaging recommended (ref: J Am Coll Radiol. 2015 Feb;12(2): 143-50). Enlarged AP window lymph node 16 mm diameter image 29. Enlarged LEFT paratracheal node 15 mm image 29. Lungs/Pleura: Multilobulated perihilar mass LEFT upper lobe, in aggregate 5.5 x 3.7 x 2.8 cm consistent with neoplasm. May contain confluent LEFT hilar lymph node. Abrupt cut off of anterior LEFT upper lobe bronchus with component of plugging by mucus or tumor. Minimal atelectasis peripheral to lesion. Remaining lungs clear. No pleural effusion, pneumothorax or additional mass/nodule. Musculoskeletal: Scattered degenerative disc disease changes  thoracic spine. Mild osseous demineralization. No discrete bone lesions. CT ABDOMEN PELVIS FINDINGS Hepatobiliary: Nonspecific low-attenuation lesion LEFT lobe liver 8 x 7 mm image 64. Gallbladder and liver otherwise normal appearance Pancreas: Tiny cyst uncinate process of pancreas 7 mm diameter image 75; no follow-up imaging recommended based on patient age and lesion size. Remainder of pancreas normal Spleen: Normal appearance Adrenals/Urinary Tract: Adrenal glands normal appearance. BILATERAL renal cysts, largest at inferior pole RIGHT kidney measuring 5.3 cm and 6.1 cm in greatest sizes. 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 No hydronephrosis or hydroureter. Dependent calculi within urinary bladder. Minimal bladder wall thickening likely related outlet obstruction. Stomach/Bowel: Appendix not identified. Stomach decompressed. Mild sigmoid diverticulosis without evidence of diverticulitis. Vascular/Lymphatic: Extensive atherosclerotic calcifications aorta and iliac arteries, visceral arteries, femoral arteries. Aorta normal caliber. No adenopathy. Reproductive: Significant prostatic enlargement, measuring 5.9 x 5.9 x 5.0 cm (volume = 91 cm3) Other: Trace nonspecific free pelvic fluid. No free air. No hernia. Musculoskeletal: Diffuse osseous demineralization. Degenerative disc and facet disease changes lumbar spine. Prior LEFT laminotomy L5. IMPRESSION: Multilobulated perihilar mass LEFT upper lobe 5.5 x 3.7 x 2.8 cm consistent with neoplasm. Associated LEFT hilar and mediastinal adenopathy. Some peripheral atelectasis in LEFT upper lobe as well as bronchial opacification question mucous plugging versus tumor. 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 Nonspecific 8 x 7 mm  low-attenuation lesion LEFT lobe liver. Mild sigmoid diverticulosis without evidence of diverticulitis. Significant prostatic enlargement with small bladder calculi. Aortic Atherosclerosis (ICD10-I70.0). These results will be called to the ordering clinician or representative by the Radiologist Assistant, and communication documented in the PACS or [employer]. Electronically Signed   By: Lavonia Dana M.D.   On: 04/24/2022 16:06   DG Chest 2 View  Result Date: 04/23/2022 CLINICAL DATA:  Weight loss and cough. Difficulty swallowing. Unintentional weight loss. EXAM: CHEST - 2 VIEW COMPARISON:  Chest x-ray dated 04/13/2016. FINDINGS: Masslike density projected of the LEFT perihilar region. Lungs are otherwise clear. No pleural effusion or pneumothorax is seen. Osseous structures about the chest are unremarkable. Heart size is within normal limits. IMPRESSION: 1. Masslike density projected of the LEFT perihilar region. Differential includes neoplastic mass or lymphadenopathy. Given the history of difficulty swallowing, suspect additional mass or lymphadenopathy within the central mediastinum. Chest CT with contrast is recommended for further characterization. 2. No evidence of pneumonia or pulmonary edema. These results will be called to the ordering clinician or representative by the Radiologist Assistant, and communication documented in the PACS or [employer]. Electronically Signed   By: Franki Cabot M.D.   On: 04/23/2022 08:23    Assessment and plan- Patient is a 87 y.o. male referred for left upper lobe lung mass  I have reviewed CT chest abdomen and  pelvis images independently and discussed findings with the patient which shows a large left upper lobe lung mass along with findings of left hilar and mediastinal adenopathy.  This is overall concerning for lung cancer potentially stage III.  I will obtain PET CT scan for further staging.  Patient does have bilateral renal cysts out of which the  right renal cyst appears complicated and concerning for a cystic neoplasm.  I will refer the patient to urology for that.  I will order staging MRI brain after bronchoscopy results are back.  The left upper lobe lung mass appears more central in location and patient would likely need bronchoscopy guided biopsy and I will be referring patient to pulmonary for this.  Discussed with the patient that lung cancer is typically classified as non-small cell versus small cell lung cancer.  Typically for stage III lung cancer treatment involves concurrent chemoradiation.  Details of chemotherapy will be discussed after pathology results are back.  I will also refer him to radiation oncology at this time.  I will see him back after PET scan and bronchoscopy results are back   Thank you for this kind referral and the opportunity to participate in the care of this patient   Visit Diagnosis 1. Mass of upper lobe of left lung   2. Mediastinal adenopathy   3. Renal cyst     Dr. Randa Evens, MD, MPH [facility] [phone] 05/02/2022

## 2022-05-02 NOTE — Telephone Encounter (Addendum)
PET scheduled 05/07/2022 and OV 05/13/2022. Patient would like sooner appt, as he is anxious.  Patient's daughter will be in town 05/09/2022.  Dr. Patsey Berthold, please advise. Thanks

## 2022-05-02 NOTE — Progress Notes (Signed)
Met with patient and his daughter during initial visit with Dr. Janese Banks today. All questions answered during visit. Reviewed upcoming appts with patient. Pt asked if can see pulmonologist sooner than 9/19. Message left with their clinic to see if appt can be moved to 9/15 which is pt's preference if pulmonologist can accomodate. Informed that will be notified on Monday if able to move his appt or not. Informed pt that will follow up with Dr. Janese Banks and Dr. Baruch Gouty after his bronchoscopy to review results and discuss treatment options if needed. Also will schedule pt for brain MRI if biopsy positive for lung cancer. Will follow up on results and coordinate further appts as needed. Contact info given and instructed to call with any questions or needs. Pt and his daughter verbalized understanding.

## 2022-05-05 ENCOUNTER — Ambulatory Visit
Admission: RE | Admit: 2022-05-05 | Discharge: 2022-05-05 | Disposition: A | Discharge status: Home / Self Care | Payer: Medicare HMO | Source: Ambulatory Visit | Attending: Nurse Practitioner | Admitting: Nurse Practitioner

## 2022-05-05 DIAGNOSIS — T17320A Food in larynx causing asphyxiation, initial encounter: Secondary | ICD-10-CM | POA: Diagnosis not present

## 2022-05-05 DIAGNOSIS — R1310 Dysphagia, unspecified: Secondary | ICD-10-CM | POA: Diagnosis not present

## 2022-05-05 NOTE — Telephone Encounter (Signed)
Appt scheduled 05/06/2022 at 2:30.

## 2022-05-05 NOTE — Telephone Encounter (Signed)
Lm for patient to offer appt.

## 2022-05-05 NOTE — Telephone Encounter (Signed)
Looks like I have a 2:30 appointment available tomorrow lets see if we can see him then.

## 2022-05-06 ENCOUNTER — Encounter: Payer: Self-pay | Admitting: Pulmonary Disease

## 2022-05-06 ENCOUNTER — Other Ambulatory Visit: Payer: Self-pay | Admitting: Pulmonary Disease

## 2022-05-06 ENCOUNTER — Telehealth: Payer: Self-pay

## 2022-05-06 ENCOUNTER — Other Ambulatory Visit: Payer: Medicare HMO

## 2022-05-06 ENCOUNTER — Ambulatory Visit: Payer: Medicare HMO | Admitting: Pulmonary Disease

## 2022-05-06 VITALS — BP 130/84 | HR 79 | Temp 97.9°F | Ht 69.0 in | Wt 159.6 lb

## 2022-05-06 DIAGNOSIS — R918 Other nonspecific abnormal finding of lung field: Secondary | ICD-10-CM

## 2022-05-06 DIAGNOSIS — I4819 Other persistent atrial fibrillation: Secondary | ICD-10-CM

## 2022-05-06 DIAGNOSIS — T17908A Unspecified foreign body in respiratory tract, part unspecified causing other injury, initial encounter: Secondary | ICD-10-CM

## 2022-05-06 DIAGNOSIS — R059 Cough, unspecified: Secondary | ICD-10-CM

## 2022-05-06 NOTE — Progress Notes (Signed)
Subjective:    Patient ID: Casey Gallegos, male    DOB: 07-31-1935, 87 y.o.   MRN: [id number] Patient Care Team: Jerrol Banana., MD as PCP - General (Family Medicine) Hollice Espy, MD as Consulting Physician (Urology) Corey Skains, MD as Consulting Physician (Cardiology) Leandrew Koyanagi, MD as Referring Physician (Ophthalmology) Ralene Bathe, MD (Dermatology) Telford Nab, RN as Oncology Nurse Navigator Sindy Guadeloupe, MD as Consulting Physician (Oncology)  Chief Complaint  Patient presents with   Consult   HPI Patient is an 87 year old remote former smoker who presents for evaluation of a left lung mass.  He is kindly referred by Dr. Randa Evens.  Patient is here with his wife today.  His primary care physician is Dr. Miguel Aschoff.  The patient had noted a 50 pound weight loss and upon evaluation by Dr. Rosanna Randy, a work-up ensued.  The patient had a chest abdomen and pelvis CT on 31 August.  This showed a multilobulated perihilar mass in the left upper lobe with an associated left hilar lymph node.  There is abrupt cut off of the anterior left upper lobe bronchus with a component of plugging by mucous or tumor.  There was minimal atelectasis peripheral to the lesion.  Remaining lungs were clear.  After this scan a referral to oncology was made and Dr. Randa Evens has kindly referred the patient to [place] for consideration of bronchoscopy.  The patient states that aside from cough during and following meals, he has not had any coughing.  No hemoptysis.  Has had the weight loss as noted above but not anorexia.  No fevers, chills or sweats.  No orthopnea or paroxysmal nocturnal dyspnea.  He has chronic lower extremity edema after an episode of DVT in the past.  Patient has chronic persistent atrial fibrillation, he is asymptomatic in this regard.  He is on Coumadin.  Previously has had a DVT as noted above.  He had robotic assisted hernia repair in February under general  anesthesia and had no difficulty with that procedure.  The patient smoked 1 pack of cigarettes per day from age 31 till age 35.  He served in [employer] 2 years no combat experience, stationed in [place].  Has worked for the [employer], [employer] and [employer].   Review of Systems A 10 point review of systems was performed and it is as noted above otherwise negative.  Past Medical History:  Diagnosis Date   A-fib [facility])    a.) CHA2DS2-VASc = 5 (age x2, HTN, DVT x2). b.) rate/rhythm maintained on oral carvedilol; chronically anticoagulated using daily warfarin.   Actinic keratosis    Anemia    BPH (benign prostatic hyperplasia)    DOE (dyspnea on exertion)    DVT (deep venous thrombosis) (HCC)    Elevated PSA    H/O degenerative disc disease    HLD (hyperlipidemia)    Hypertension    Lipoma of arm    Lipomatosis    Long term current use of anticoagulant    a.) warfarin   NICM (nonischemic cardiomyopathy) ([place]) 11/22/2015   a.) TTE 11/22/2015: EF 40%. b.) TTE 12/27/2018: EF 45%. c.) TTE 08/30/2020: EF 45-50%.   Night sweats    Obesity    Osteoarthritis    RBBB (right bundle branch block)    Right inguinal hernia    VHD (valvular heart disease) 11/22/2015   a.) TTE 11/22/2015: EF 40%; mod MR, mod-sev TR, triv PR; mod BAE, mild  RV enlargement. b.) TTE 12/27/2018: EF 45%; mod MR, mod-sev TR, triv PR; mod BAE, mild RV enlargement. c.) TTE 08/30/2020: EF 45-50%; mild MR/TR.   Past Surgical History:  Procedure Laterality Date   Bilateral Radical Keratotomy Bilateral 1997   COLONOSCOPY WITH PROPOFOL N/A 01/11/2016   Procedure: COLONOSCOPY WITH PROPOFOL;  Surgeon: Manya Silvas, MD;  Location: [facility] ENDOSCOPY;  Service: Endoscopy;  Laterality: N/A; repeat 3 years, tubular adenoma   deep vein thrombosis     DENTAL SURGERY     Patient had implants   GUM SURGERY     L4 - S1 decompression Left    Left-sided   PROSTATE BIOPSY     TONSILLECTOMY AND ADENOIDECTOMY  age 64    XI ROBOTIC ASSISTED INGUINAL HERNIA REPAIR WITH MESH Right 10/21/2021   Procedure: XI ROBOTIC ASSISTED INGUINAL HERNIA REPAIR WITH MESH;  Surgeon: Ronny Bacon, MD;  Location: [facility] ORS;  Service: General;  Laterality: Right;   Patient Active Problem List   Diagnosis Date Noted   Right inguinal hernia 10/16/2021   Inguinal bulge 10/07/2021   Muscle strain 09/10/2021   Cardiomyopathy ([place]) 12/30/2018   Chronic venous insufficiency 11/11/2017   Lymphedema 11/11/2017   Pain in limb 10/27/2017   Cardiomyopathy, dilated, nonischemic ([place]) 11/07/2015   A-fib ([place]) 03/02/2015   Deep vein thrombosis ([place]) 03/02/2015   Essential (primary) hypertension 03/02/2015   Combined fat and carbohydrate induced hyperlipemia 03/02/2015   Heart valve disease 03/02/2015   Allergic rhinitis 02/28/2015   Absolute anemia 02/28/2015   Benign fibroma of prostate 02/28/2015   Back pain, chronic 02/28/2015   Narrowing of intervertebral disc space 02/28/2015   Acute thromboembolism of deep veins of lower extremity ([place]) 02/28/2015   ED (erectile dysfunction) of organic origin 02/28/2015   Abnormal prostate specific antigen 02/28/2015   BP (high blood pressure) 02/28/2015   Lipomatosis 02/28/2015   Malaise and fatigue 02/28/2015   Generalized hyperhidrosis 02/28/2015   Arthritis, degenerative 02/28/2015   AF (paroxysmal atrial fibrillation) ([place]) 02/28/2015   Chronic prostatitis 02/28/2015   Spinal stenosis 02/28/2015   Atypical pneumonia 02/28/2015   Acute embolism and thrombosis of deep vein of lower extremity ([place]) 02/28/2015   Family History  Problem Relation Age of Onset   Cancer Mother        secondary to cancinoma of the jaw from her dipping snuff.   Heart attack Father    CAD Father    Hypertension Sister    Diabetes Son        Type I diabetes   Prostate cancer Neg Hx    Kidney cancer Neg Hx    Social History   Tobacco Use   Smoking status: Former    Types: Cigarettes    Quit date:  08/26/1971    Years since quitting: 50.7    Passive exposure: Never   Smokeless tobacco: Never   Tobacco comments:    Smoked for about 20 years.  Substance Use Topics   Alcohol use: Not Currently   Allergies  Allergen Reactions   Latex Swelling   Cefdinir Nausea Only    hypersensitivity to smell   Doxycycline     Sun sensitivity   Prednisone     Hallucinations    Sertraline Other (See Comments)    Hallucinations    Current Meds  Medication Sig   amLODipine (NORVASC) 5 MG tablet TAKE 1 TABLET EVERY DAY   benazepril (LOTENSIN) 40 MG tablet TAKE 1 TABLET EVERY DAY   carvedilol (COREG) 6.25  MG tablet TAKE 1 TABLET TWICE DAILY   CORTIZONE-10 DIABETICS SKIN 1 % lotion Apply 1 application topically daily as needed for itching.   Cyanocobalamin (VITAMIN B 12 PO) Take by mouth.   doxazosin (CARDURA) 8 MG tablet TAKE 1 TABLET EVERY DAY   finasteride (PROSCAR) 5 MG tablet Take 1 tablet (5 mg total) by mouth daily.   gabapentin (NEURONTIN) 300 MG capsule TAKE 1 CAPSULE EVERY MORNING, 1 CAPSULE IN THE AFTERNOON AND 2 CAPSULES AT BEDTIME   Magnesium 500 MG TABS Take 500 mg by mouth daily.   Multiple Vitamin (MULTI-VITAMIN) tablet Take 1 tablet by mouth daily.    omeprazole (PRILOSEC) 20 MG capsule Take 1 capsule (20 mg total) by mouth every morning.   triamterene-hydrochlorothiazide (MAXZIDE-25) 37.5-25 MG tablet TAKE 1/2 TABLET EVERY DAY   warfarin (COUMADIN) 4 MG tablet TAKE 1 TABLET EVERY DAY IN THE AFTERNOON   Immunization History  Administered Date(s) Administered   Fluad Quad(high Dose 65+) 05/10/2019, 07/03/2021   Influenza, High Dose Seasonal PF 05/11/2015, 05/21/2016, 05/27/2017, 05/26/2018, 05/01/2020   PFIZER(Purple Top)SARS-COV-2 Vaccination 09/09/2019, 09/30/2019, 05/25/2020   Pneumococcal Conjugate-13 01/09/2014   Pneumococcal Polysaccharide-23 08/08/2004   Td 10/25/2008       Objective:   Physical Exam BP 130/84 (BP Location: Left Arm, Patient Position: Sitting, Cuff  Size: Normal)   Pulse 79   Temp 97.9 F (36.6 C)   Ht 5\' 9"  (1.753 m)   Wt 159 lb 9.6 oz (72.4 kg)   SpO2 97%   BMI 23.57 kg/m  GENERAL: Well-developed, well-nourished gentleman, no acute distress.  Occasionally befuddled.  No residual dyspnea.  Fully ambulatory. HEAD: Normocephalic, atraumatic.  EYES: Pupils equal, round, reactive to light.  No scleral icterus.  MOUTH: Dental implants.  Mallampati III airway, oral mucosa moist. NECK: Supple. No thyromegaly. Trachea midline. No JVD.  No adenopathy. PULMONARY: Good air entry bilaterally.  No adventitious sounds. CARDIOVASCULAR: S1 and S2.  Irregular rate and rhythm, controlled VR.  No rubs murmurs gallops heard. ABDOMEN: Benign. MUSCULOSKELETAL: No joint deformity, no clubbing, there is bilateral lower extremity edema left greater than right, pitting 2+ left, 1+ right.  NEUROLOGIC: Somewhat forgetful.  No overt focal deficits. SKIN: Intact,warm,dry. PSYCH: Anxious, behavior normal.   Representative images from CT performed 23 April 2022 showing left hilar mass (arrow) and mediastinal adenopathy:         Assessment & Plan:     ICD-10-CM   1. Hilar mass - LEFT  R91.8 CT Super D Chest W Contrast    CT SUPER D CHEST WO CONTRAST   This is carcinoma until proven otherwise Best method for biopsy is bronchoscopy with endobronchial ultrasound Scheduled for 19 May 2022, 12:30 PM    2. Aspiration into airway, initial encounter  T17.908A    Noted on barium swallow speech pathology evaluation recommended Will order speech pathology consultation    3. Cough, unspecified type  R05.9 DG SWALLOW FUNC OP MEDICARE SPEECH PATH    CANCELED: SLP clinical swallow evaluation   Likely related to 2 issues noted above    4. Persistent atrial fibrillation (HCC)  I48.19    On Coumadin Patient will require holding Coumadin for 5 days prior to procedure     Orders Placed This Encounter  Procedures   CT SUPER D CHEST WO CONTRAST     [facility] PROTOCOL ARMS DOWN INSPIRATORY IMAGES "SLICE THICKNESS 4.17" "SLICE INTERVAL 0.5"    Standing Status:   Future    Standing Expiration Date:  05/07/2023    Order Specific Question:   Preferred imaging location?    Answer:   [facility]   DG SWALLOW FUNC OP MEDICARE SPEECH PATH    Standing Status:   Future    Standing Expiration Date:   05/07/2023    Order Specific Question:   Reason for Exam (SYMPTOM  OR DIAGNOSIS REQUIRED)    Answer:   cough    Order Specific Question:   Where should this test be performed?    Answer:   [facility])   Patient will need bronchoscopy with endobronchial ultrasound.  Will obtain additional imaging prior to the procedure in case body vision tools may be necessary to reach the left upper lobe mass.  He will have a PET/CT performed tomorrow and we will review this prior to the procedure.  This may add additional targets of concern that may need biopsying during the procedure.  Patient also has been having difficulties with cough particularly during meals and a barium swallow that was consistent with aspiration.  We will place a consult to speech pathology.  We will see the patient in follow-up in 4 to 6 weeks time but we will stay in contact with the patient before and after the procedures performed.  Patient has appropriate oncology follow-ups already.   Casey Don, MD Advanced Bronchoscopy PCCM  Pulmonary-[place]    *This note was dictated using voice recognition software/Dragon.  Despite best efforts to proofread, errors can occur which can change the meaning. Any transcriptional errors that result from this process are unintentional and may not be fully corrected at the time of dictation.

## 2022-05-06 NOTE — Patient Instructions (Signed)
We will schedule your biopsy procedure for 25 September at 12:30 PM.  You will receive instructions prior to that.  The procedure is done in the same-day surgery area in the main hospital [facility]) on the second floor.  You will need to not take your Coumadin for at least 5 days prior to the procedure.  You will not be able to eat from midnight prior the day of the procedure.  The procedure will be a bronchoscopy with endobronchial ultrasound.  Additional tools may be used to get to the area in question so that we can get biopsies and as needed.  The main issue is on the LEFT lung.  We will see him in follow-up in 4 to 6 weeks time.  However we will be in contact with you before and after the procedure to let you know results of the procedure etc.  We will schedule consultation with the speech pathologist.

## 2022-05-06 NOTE — Telephone Encounter (Signed)
For the codes 31628, [id number], [phone] Prior Auth Not Required Refer # [id number]  [id number]

## 2022-05-06 NOTE — Telephone Encounter (Signed)
Phone pre admit visit scheduled 05/13/2022 between 8-1 and covid test 05/16/2022 between 8-12.  Lm for patient.

## 2022-05-06 NOTE — Telephone Encounter (Signed)
Bronchoscopy with EBUS scheduled 05/19/2022 at 12:30. [id number], [id number] AL:PFXT mass  Rodena Piety, please see bronch info. Thanks

## 2022-05-06 NOTE — H&P (View-Only) (Signed)
Subjective:    Patient ID: Casey Gallegos, male    DOB: 07-Jan-1935, 87 y.o.   MRN: [id number] Patient Care Team: Jerrol Banana., MD as PCP - General (Family Medicine) Hollice Espy, MD as Consulting Physician (Urology) Corey Skains, MD as Consulting Physician (Cardiology) Leandrew Koyanagi, MD as Referring Physician (Ophthalmology) Ralene Bathe, MD (Dermatology) Telford Nab, RN as Oncology Nurse Navigator Sindy Guadeloupe, MD as Consulting Physician (Oncology)  Chief Complaint  Patient presents with   Consult   HPI Patient is an 87 year old remote former smoker who presents for evaluation of a left lung mass.  He is kindly referred by Dr. Randa Evens.  Patient is here with his wife today.  His primary care physician is Dr. Miguel Aschoff.  The patient had noted a 50 pound weight loss and upon evaluation by Dr. Rosanna Randy, a work-up ensued.  The patient had a chest abdomen and pelvis CT on 31 August.  This showed a multilobulated perihilar mass in the left upper lobe with an associated left hilar lymph node.  There is abrupt cut off of the anterior left upper lobe bronchus with a component of plugging by mucous or tumor.  There was minimal atelectasis peripheral to the lesion.  Remaining lungs were clear.  After this scan a referral to oncology was made and Dr. Randa Evens has kindly referred the patient to [place] for consideration of bronchoscopy.  The patient states that aside from cough during and following meals, he has not had any coughing.  No hemoptysis.  Has had the weight loss as noted above but not anorexia.  No fevers, chills or sweats.  No orthopnea or paroxysmal nocturnal dyspnea.  He has chronic lower extremity edema after an episode of DVT in the past.  Patient has chronic persistent atrial fibrillation, he is asymptomatic in this regard.  He is on Coumadin.  Previously has had a DVT as noted above.  He had robotic assisted hernia repair in February under general  anesthesia and had no difficulty with that procedure.  The patient smoked 1 pack of cigarettes per day from age 54 till age 67.  He served in [employer] 2 years no combat experience, stationed in [place].  Has worked for the [employer], [place] and [employer].   Review of Systems A 10 point review of systems was performed and it is as noted above otherwise negative.  Past Medical History:  Diagnosis Date   A-fib [facility])    a.) CHA2DS2-VASc = 5 (age x2, HTN, DVT x2). b.) rate/rhythm maintained on oral carvedilol; chronically anticoagulated using daily warfarin.   Actinic keratosis    Anemia    BPH (benign prostatic hyperplasia)    DOE (dyspnea on exertion)    DVT (deep venous thrombosis) (HCC)    Elevated PSA    H/O degenerative disc disease    HLD (hyperlipidemia)    Hypertension    Lipoma of arm    Lipomatosis    Long term current use of anticoagulant    a.) warfarin   NICM (nonischemic cardiomyopathy) ([place]) 11/22/2015   a.) TTE 11/22/2015: EF 40%. b.) TTE 12/27/2018: EF 45%. c.) TTE 08/30/2020: EF 45-50%.   Night sweats    Obesity    Osteoarthritis    RBBB (right bundle branch block)    Right inguinal hernia    VHD (valvular heart disease) 11/22/2015   a.) TTE 11/22/2015: EF 40%; mod MR, mod-sev TR, triv PR; mod BAE, mild  RV enlargement. b.) TTE 12/27/2018: EF 45%; mod MR, mod-sev TR, triv PR; mod BAE, mild RV enlargement. c.) TTE 08/30/2020: EF 45-50%; mild MR/TR.   Past Surgical History:  Procedure Laterality Date   Bilateral Radical Keratotomy Bilateral 1997   COLONOSCOPY WITH PROPOFOL N/A 01/11/2016   Procedure: COLONOSCOPY WITH PROPOFOL;  Surgeon: Manya Silvas, MD;  Location: [facility] ENDOSCOPY;  Service: Endoscopy;  Laterality: N/A; repeat 3 years, tubular adenoma   deep vein thrombosis     DENTAL SURGERY     Patient had implants   GUM SURGERY     L4 - S1 decompression Left    Left-sided   PROSTATE BIOPSY     TONSILLECTOMY AND ADENOIDECTOMY  age 34    XI ROBOTIC ASSISTED INGUINAL HERNIA REPAIR WITH MESH Right 10/21/2021   Procedure: XI ROBOTIC ASSISTED INGUINAL HERNIA REPAIR WITH MESH;  Surgeon: Ronny Bacon, MD;  Location: [facility] ORS;  Service: General;  Laterality: Right;   Patient Active Problem List   Diagnosis Date Noted   Right inguinal hernia 10/16/2021   Inguinal bulge 10/07/2021   Muscle strain 09/10/2021   Cardiomyopathy ([place]) 12/30/2018   Chronic venous insufficiency 11/11/2017   Lymphedema 11/11/2017   Pain in limb 10/27/2017   Cardiomyopathy, dilated, nonischemic ([place]) 11/07/2015   A-fib ([place]) 03/02/2015   Deep vein thrombosis ([place]) 03/02/2015   Essential (primary) hypertension 03/02/2015   Combined fat and carbohydrate induced hyperlipemia 03/02/2015   Heart valve disease 03/02/2015   Allergic rhinitis 02/28/2015   Absolute anemia 02/28/2015   Benign fibroma of prostate 02/28/2015   Back pain, chronic 02/28/2015   Narrowing of intervertebral disc space 02/28/2015   Acute thromboembolism of deep veins of lower extremity ([place]) 02/28/2015   ED (erectile dysfunction) of organic origin 02/28/2015   Abnormal prostate specific antigen 02/28/2015   BP (high blood pressure) 02/28/2015   Lipomatosis 02/28/2015   Malaise and fatigue 02/28/2015   Generalized hyperhidrosis 02/28/2015   Arthritis, degenerative 02/28/2015   AF (paroxysmal atrial fibrillation) ([place]) 02/28/2015   Chronic prostatitis 02/28/2015   Spinal stenosis 02/28/2015   Atypical pneumonia 02/28/2015   Acute embolism and thrombosis of deep vein of lower extremity ([place]) 02/28/2015   Family History  Problem Relation Age of Onset   Cancer Mother        secondary to cancinoma of the jaw from her dipping snuff.   Heart attack Father    CAD Father    Hypertension Sister    Diabetes Son        Type I diabetes   Prostate cancer Neg Hx    Kidney cancer Neg Hx    Social History   Tobacco Use   Smoking status: Former    Types: Cigarettes    Quit date:  08/26/1971    Years since quitting: 50.7    Passive exposure: Never   Smokeless tobacco: Never   Tobacco comments:    Smoked for about 20 years.  Substance Use Topics   Alcohol use: Not Currently   Allergies  Allergen Reactions   Latex Swelling   Cefdinir Nausea Only    hypersensitivity to smell   Doxycycline     Sun sensitivity   Prednisone     Hallucinations    Sertraline Other (See Comments)    Hallucinations    Current Meds  Medication Sig   amLODipine (NORVASC) 5 MG tablet TAKE 1 TABLET EVERY DAY   benazepril (LOTENSIN) 40 MG tablet TAKE 1 TABLET EVERY DAY   carvedilol (COREG) 6.25  MG tablet TAKE 1 TABLET TWICE DAILY   CORTIZONE-10 DIABETICS SKIN 1 % lotion Apply 1 application topically daily as needed for itching.   Cyanocobalamin (VITAMIN B 12 PO) Take by mouth.   doxazosin (CARDURA) 8 MG tablet TAKE 1 TABLET EVERY DAY   finasteride (PROSCAR) 5 MG tablet Take 1 tablet (5 mg total) by mouth daily.   gabapentin (NEURONTIN) 300 MG capsule TAKE 1 CAPSULE EVERY MORNING, 1 CAPSULE IN THE AFTERNOON AND 2 CAPSULES AT BEDTIME   Magnesium 500 MG TABS Take 500 mg by mouth daily.   Multiple Vitamin (MULTI-VITAMIN) tablet Take 1 tablet by mouth daily.    omeprazole (PRILOSEC) 20 MG capsule Take 1 capsule (20 mg total) by mouth every morning.   triamterene-hydrochlorothiazide (MAXZIDE-25) 37.5-25 MG tablet TAKE 1/2 TABLET EVERY DAY   warfarin (COUMADIN) 4 MG tablet TAKE 1 TABLET EVERY DAY IN THE AFTERNOON   Immunization History  Administered Date(s) Administered   Fluad Quad(high Dose 65+) 05/10/2019, 07/03/2021   Influenza, High Dose Seasonal PF 05/11/2015, 05/21/2016, 05/27/2017, 05/26/2018, 05/01/2020   PFIZER(Purple Top)SARS-COV-2 Vaccination 09/09/2019, 09/30/2019, 05/25/2020   Pneumococcal Conjugate-13 01/09/2014   Pneumococcal Polysaccharide-23 08/08/2004   Td 10/25/2008       Objective:   Physical Exam BP 130/84 (BP Location: Left Arm, Patient Position: Sitting, Cuff  Size: Normal)   Pulse 79   Temp 97.9 F (36.6 C)   Ht 5\' 9"  (1.753 m)   Wt 159 lb 9.6 oz (72.4 kg)   SpO2 97%   BMI 23.57 kg/m  GENERAL: Well-developed, well-nourished gentleman, no acute distress.  Occasionally befuddled.  No residual dyspnea.  Fully ambulatory. HEAD: Normocephalic, atraumatic.  EYES: Pupils equal, round, reactive to light.  No scleral icterus.  MOUTH: Dental implants.  Mallampati III airway, oral mucosa moist. NECK: Supple. No thyromegaly. Trachea midline. No JVD.  No adenopathy. PULMONARY: Good air entry bilaterally.  No adventitious sounds. CARDIOVASCULAR: S1 and S2.  Irregular rate and rhythm, controlled VR.  No rubs murmurs gallops heard. ABDOMEN: Benign. MUSCULOSKELETAL: No joint deformity, no clubbing, there is bilateral lower extremity edema left greater than right, pitting 2+ left, 1+ right.  NEUROLOGIC: Somewhat forgetful.  No overt focal deficits. SKIN: Intact,warm,dry. PSYCH: Anxious, behavior normal.   Representative images from CT performed 23 April 2022 showing left hilar mass (arrow) and mediastinal adenopathy:         Assessment & Plan:     ICD-10-CM   1. Hilar mass - LEFT  R91.8 CT Super D Chest W Contrast    CT SUPER D CHEST WO CONTRAST   This is carcinoma until proven otherwise Best method for biopsy is bronchoscopy with endobronchial ultrasound Scheduled for 19 May 2022, 12:30 PM    2. Aspiration into airway, initial encounter  T17.908A    Noted on barium swallow speech pathology evaluation recommended Will order speech pathology consultation    3. Cough, unspecified type  R05.9 DG SWALLOW FUNC OP MEDICARE SPEECH PATH    CANCELED: SLP clinical swallow evaluation   Likely related to 2 issues noted above    4. Persistent atrial fibrillation (HCC)  I48.19    On Coumadin Patient will require holding Coumadin for 5 days prior to procedure     Orders Placed This Encounter  Procedures   CT SUPER D CHEST WO CONTRAST     [facility] PROTOCOL ARMS DOWN INSPIRATORY IMAGES "SLICE THICKNESS 9.51" "SLICE INTERVAL 0.5"    Standing Status:   Future    Standing Expiration Date:  05/07/2023    Order Specific Question:   Preferred imaging location?    Answer:   [facility]   DG SWALLOW FUNC OP MEDICARE SPEECH PATH    Standing Status:   Future    Standing Expiration Date:   05/07/2023    Order Specific Question:   Reason for Exam (SYMPTOM  OR DIAGNOSIS REQUIRED)    Answer:   cough    Order Specific Question:   Where should this test be performed?    Answer:   [facility])   Patient will need bronchoscopy with endobronchial ultrasound.  Will obtain additional imaging prior to the procedure in case body vision tools may be necessary to reach the left upper lobe mass.  He will have a PET/CT performed tomorrow and we will review this prior to the procedure.  This may add additional targets of concern that may need biopsying during the procedure.  Patient also has been having difficulties with cough particularly during meals and a barium swallow that was consistent with aspiration.  We will place a consult to speech pathology.  We will see the patient in follow-up in 4 to 6 weeks time but we will stay in contact with the patient before and after the procedures performed.  Patient has appropriate oncology follow-ups already.   Renold Don, MD Advanced Bronchoscopy PCCM [facility]    *This note was dictated using voice recognition software/Dragon.  Despite best efforts to proofread, errors can occur which can change the meaning. Any transcriptional errors that result from this process are unintentional and may not be fully corrected at the time of dictation.

## 2022-05-07 ENCOUNTER — Encounter (HOSPITAL_COMMUNITY)
Admission: RE | Admit: 2022-05-07 | Discharge: 2022-05-07 | Disposition: A | Discharge status: Home / Self Care | Payer: Medicare HMO | Source: Ambulatory Visit | Attending: Oncology | Admitting: Oncology

## 2022-05-07 DIAGNOSIS — R918 Other nonspecific abnormal finding of lung field: Secondary | ICD-10-CM | POA: Diagnosis not present

## 2022-05-07 DIAGNOSIS — C771 Secondary and unspecified malignant neoplasm of intrathoracic lymph nodes: Secondary | ICD-10-CM | POA: Diagnosis not present

## 2022-05-07 DIAGNOSIS — C3412 Malignant neoplasm of upper lobe, left bronchus or lung: Secondary | ICD-10-CM | POA: Diagnosis not present

## 2022-05-07 DIAGNOSIS — N210 Calculus in bladder: Secondary | ICD-10-CM | POA: Diagnosis not present

## 2022-05-07 DIAGNOSIS — I2510 Atherosclerotic heart disease of native coronary artery without angina pectoris: Secondary | ICD-10-CM | POA: Diagnosis not present

## 2022-05-07 LAB — GLUCOSE, CAPILLARY: Glucose-Capillary: 98 mg/dL (ref 70–99)

## 2022-05-07 MED ORDER — FLUDEOXYGLUCOSE F - 18 (FDG) INJECTION
7.9500 | Freq: Once | INTRAVENOUS | Status: AC
  Administered 2022-05-07: 7.95 via INTRAVENOUS

## 2022-05-07 NOTE — Telephone Encounter (Signed)
Patient's spouse, Doris(DPR) is aware of below dates/times and voiced her understanding.  Nothing further needed.

## 2022-05-12 ENCOUNTER — Other Ambulatory Visit: Payer: Medicare HMO

## 2022-05-13 ENCOUNTER — Other Ambulatory Visit: Payer: Medicare HMO

## 2022-05-13 ENCOUNTER — Institutional Professional Consult (permissible substitution): Payer: Medicare HMO | Admitting: Pulmonary Disease

## 2022-05-13 ENCOUNTER — Encounter
Admission: RE | Admit: 2022-05-13 | Discharge: 2022-05-13 | Disposition: A | Discharge status: Home / Self Care | Payer: Medicare HMO | Source: Ambulatory Visit | Attending: Pulmonary Disease | Admitting: Pulmonary Disease

## 2022-05-13 ENCOUNTER — Other Ambulatory Visit: Payer: Self-pay

## 2022-05-13 DIAGNOSIS — Z01812 Encounter for preprocedural laboratory examination: Secondary | ICD-10-CM

## 2022-05-13 DIAGNOSIS — Z7901 Long term (current) use of anticoagulants: Secondary | ICD-10-CM

## 2022-05-13 HISTORY — DX: Gastro-esophageal reflux disease without esophagitis: K21.9

## 2022-05-13 NOTE — Patient Instructions (Addendum)
Your procedure is scheduled on: 05/19/22 - Monday Report to the Registration Desk on the 1st floor of the [facility]. To find out your arrival time, please call [phone] between 1PM - 3PM on: 05/16/22 - Friday If your arrival time is 6:00 am, do not arrive prior to that time as the [facility] entrance doors do not open until 6:00 am.  REMEMBER: Instructions that are not followed completely may result in serious medical risk, up to and including death; or upon the discretion of your surgeon and anesthesiologist your surgery may need to be rescheduled.  Do not eat food or drink any fluids after midnight the night before surgery.  No gum chewing, lozengers or hard candies.  TAKE THESE MEDICATIONS THE MORNING OF SURGERY WITH A SIP OF WATER:  - amLODipine (NORVASC) - carvedilol (COREG)  - gabapentin (NEURONTIN) - omeprazole (PRILOSEC) - (take one the night before and one on the morning of surgery - helps to prevent nausea after surgery.)  HOLD warfarin (COUMADIN) 5 days prior to your procedure.  One week prior to surgery: Stop Anti-inflammatories (NSAIDS) such as Advil, Aleve, Ibuprofen, Motrin, Naproxen, Naprosyn and Aspirin based products such as Excedrin, Goodys Powder, BC Powder.  Stop ANY OVER THE COUNTER supplements until after surgery : Magnesium, Multiple Vitamin .  You may however, continue to take Tylenol if needed for pain up until the day of surgery.  No Alcohol for 24 hours before or after surgery.  No Smoking including e-cigarettes for 24 hours prior to surgery.  No chewable tobacco products for at least 6 hours prior to surgery.  No nicotine patches on the day of surgery.  Do not use any "recreational" drugs for at least a week prior to your surgery.  Please be advised that the combination of cocaine and anesthesia may have negative outcomes, up to and including death. If you test positive for cocaine, your surgery will be cancelled.  On the morning of surgery  brush your teeth with toothpaste and water, you may rinse your mouth with mouthwash if you wish. Do not swallow any toothpaste or mouthwash.  Do not wear jewelry, make-up, hairpins, clips or nail polish.  Do not wear lotions, powders, or perfumes.   Do not shave body from the neck down 48 hours prior to surgery just in case you cut yourself which could leave a site for infection.  Also, freshly shaved skin may become irritated if using the CHG soap.  Contact lenses, hearing aids and dentures may not be worn into surgery.  Do not bring valuables to the hospital. [facility] is not responsible for any missing/lost belongings or valuables.   Notify your doctor if there is any change in your medical condition (cold, fever, infection).  Wear comfortable clothing (specific to your surgery type) to the hospital.  After surgery, you can help prevent lung complications by doing breathing exercises.  Take deep breaths and cough every 1-2 hours. Your doctor may order a device called an Incentive Spirometer to help you take deep breaths. When coughing or sneezing, hold a pillow firmly against your incision with both hands. This is called "splinting." Doing this helps protect your incision. It also decreases belly discomfort.  If you are being admitted to the hospital overnight, leave your suitcase in the car. After surgery it may be brought to your room.  If you are being discharged the day of surgery, you will not be allowed to drive home. You will need a responsible adult (18  years or older) to drive you home and stay with you that night.   If you are taking public transportation, you will need to have a responsible adult (18 years or older) with you. Please confirm with your physician that it is acceptable to use public transportation.   Please call the [place] Dept. at [phone] if you have any questions about these instructions.  Surgery Visitation Policy:  Patients  undergoing a surgery or procedure may have two family members or support persons with them as long as the person is not COVID-19 positive or experiencing its symptoms.   Inpatient Visitation:    Visiting hours are 7 a.m. to 8 p.m. Up to four visitors are allowed at one time in a patient room, including children. The visitors may rotate out with other people during the day. One designated support person (adult) may remain overnight.

## 2022-05-14 ENCOUNTER — Other Ambulatory Visit: Payer: Self-pay | Admitting: Family Medicine

## 2022-05-14 ENCOUNTER — Ambulatory Visit (INDEPENDENT_AMBULATORY_CARE_PROVIDER_SITE_OTHER): Payer: Medicare HMO | Admitting: Physician Assistant

## 2022-05-14 ENCOUNTER — Telehealth: Payer: Self-pay | Admitting: Pulmonary Disease

## 2022-05-14 DIAGNOSIS — I10 Essential (primary) hypertension: Secondary | ICD-10-CM

## 2022-05-14 DIAGNOSIS — I480 Paroxysmal atrial fibrillation: Secondary | ICD-10-CM

## 2022-05-14 LAB — POCT INR
INR: 2 (ref 2.0–3.0)
PT: 26.8

## 2022-05-14 NOTE — Telephone Encounter (Signed)
Called and spoke with the pt, provided number for preadmit testing so that they can help him with timing of covid test. Nothing further needed.

## 2022-05-14 NOTE — Patient Instructions (Signed)
Description   Hold for 5 days due to upcomming procedure scheduled for 05/19/2022. Recheck coumadin level 1 week after resuming coumadin therapy.

## 2022-05-15 ENCOUNTER — Ambulatory Visit
Admission: RE | Admit: 2022-05-15 | Discharge: 2022-05-15 | Disposition: A | Discharge status: Home / Self Care | Payer: Medicare HMO | Source: Ambulatory Visit | Attending: Pulmonary Disease | Admitting: Pulmonary Disease

## 2022-05-15 ENCOUNTER — Other Ambulatory Visit: Payer: Medicare HMO

## 2022-05-15 DIAGNOSIS — J90 Pleural effusion, not elsewhere classified: Secondary | ICD-10-CM | POA: Diagnosis not present

## 2022-05-15 DIAGNOSIS — J439 Emphysema, unspecified: Secondary | ICD-10-CM | POA: Diagnosis not present

## 2022-05-15 DIAGNOSIS — R918 Other nonspecific abnormal finding of lung field: Secondary | ICD-10-CM | POA: Diagnosis not present

## 2022-05-15 DIAGNOSIS — C3490 Malignant neoplasm of unspecified part of unspecified bronchus or lung: Secondary | ICD-10-CM | POA: Diagnosis not present

## 2022-05-16 ENCOUNTER — Encounter
Admission: RE | Admit: 2022-05-16 | Discharge: 2022-05-16 | Disposition: A | Discharge status: Home / Self Care | Payer: Medicare HMO | Source: Ambulatory Visit | Attending: Pulmonary Disease | Admitting: Pulmonary Disease

## 2022-05-16 ENCOUNTER — Encounter: Payer: Self-pay | Admitting: Urgent Care

## 2022-05-16 ENCOUNTER — Encounter: Payer: Self-pay | Admitting: Pulmonary Disease

## 2022-05-16 ENCOUNTER — Other Ambulatory Visit: Payer: Medicare HMO

## 2022-05-16 DIAGNOSIS — E782 Mixed hyperlipidemia: Secondary | ICD-10-CM | POA: Diagnosis not present

## 2022-05-16 DIAGNOSIS — I10 Essential (primary) hypertension: Secondary | ICD-10-CM | POA: Diagnosis not present

## 2022-05-16 DIAGNOSIS — Z20822 Contact with and (suspected) exposure to covid-19: Secondary | ICD-10-CM | POA: Insufficient documentation

## 2022-05-16 DIAGNOSIS — Z01818 Encounter for other preprocedural examination: Secondary | ICD-10-CM | POA: Diagnosis not present

## 2022-05-16 DIAGNOSIS — I4510 Unspecified right bundle-branch block: Secondary | ICD-10-CM | POA: Diagnosis not present

## 2022-05-16 DIAGNOSIS — Z01810 Encounter for preprocedural cardiovascular examination: Secondary | ICD-10-CM | POA: Diagnosis not present

## 2022-05-16 DIAGNOSIS — Z1152 Encounter for screening for COVID-19: Secondary | ICD-10-CM

## 2022-05-16 DIAGNOSIS — I4891 Unspecified atrial fibrillation: Secondary | ICD-10-CM | POA: Diagnosis not present

## 2022-05-16 DIAGNOSIS — R9431 Abnormal electrocardiogram [ECG] [EKG]: Secondary | ICD-10-CM | POA: Diagnosis not present

## 2022-05-16 DIAGNOSIS — I482 Chronic atrial fibrillation, unspecified: Secondary | ICD-10-CM | POA: Diagnosis not present

## 2022-05-16 NOTE — Progress Notes (Signed)
Perioperative Services  Pre-Admission/Anesthesia Testing Clinical Review  Date: 05/16/22  Patient Demographics:  Name: Casey Gallegos DOB:   1935/08/20 MRN:   [id number]  Planned Surgical Procedure(s):    Case: [id number] Date/Time: 05/19/22 1230   Procedures:      FLEXIBLE BRONCHOSCOPY (Left)     VIDEO BRONCHOSCOPY WITH ENDOBRONCHIAL ULTRASOUND (Left)   Anesthesia type: General   Pre-op diagnosis: lung mass   Location: [facility] PROCEDURE RM 02 / [facility] ORS FOR ANESTHESIA GROUP   Surgeons: Tyler Pita, MD   NOTE: Available PAT nursing documentation and vital signs have been reviewed. Clinical nursing staff has updated patient's PMH/PSHx, current medication list, and drug allergies/intolerances to ensure comprehensive history available to assist in medical decision making as it pertains to the aforementioned surgical procedure and anticipated anesthetic course. Extensive review of available clinical information performed. [place] [facility] and PSHx updated with any diagnoses/procedures that  may have been inadvertently omitted during his intake with the pre-admission testing department's nursing staff.  Clinical Discussion:  Casey Gallegos is a 87 y.o. male who is submitted for pre-surgical anesthesia review and clearance prior to him undergoing the above procedure. Patient is a Former Smoker (quit 08/1971). Pertinent PMH includes: atrial fibrillation, NICM, VHD, RBBB, DVT, aortic atherosclerosis, HTN, HLD, DOE, LEFT upper lobe pulmonary mass, anemia, OA, lumbar DDD (s/p L5 laminectomy), BPH.  Patient is followed by cardiology Nehemiah Massed, MD). He was last seen in the cardiology clinic on 05/16/2022.  At the time of his clinic visit, patient reporting chronic exertional dyspnea, however this symptom was reported to be at baseline.  He denied any episodes of chest pain, PND, orthopnea, palpitations, significant peripheral edema, vertiginous symptoms, or presyncope/syncope.  Patient with a past medical  history significant for cardiovascular diagnoses.  TTE performed on 11/22/2015 revealed a reduced left ventricular systolic function with an EF of 40%.  There was moderate biatrial enlargement and mild right ventricular enlargement.  Moderate mitral valve, moderate to severe tricuspid valve, and trivial pulmonary valve regurgitation observed.  There was no evidence of a significant transvalvular gradient to suggest stenosis.  Most recent TTE was performed on 08/30/2020 to follow-up on patient's and ICM diagnosis.  Left ventricular systolic function stable with an EF of 45-50%.  Additionally, patient with ADHD diagnosis.  Study revealed improvement in patient's valvular function revealing only mild mitral and tricuspid valve regurgitation.  Long-term cardiac event monitor study performed on 06/04/2021 revealed predominant underlying atrial fibrillation with occasional PVCs.  Evidence of sick sinus syndrome and asymptomatic bradycardia with long RR intervals of >3 seconds noted.  Discussed potential for PPM placement for sick sinus syndrome if/when patient becomes more symptomatic.  Patient has a history of atrial fibrillation; CHA2DS2-VASc Score = 6 (age x 2, HTN, DVT x 2, vascular disease history).  Rate and rhythm maintained on oral carvedilol.  Patient is chronically anticoagulated using daily warfarin; compliant with therapy with no evidence or reports of GI bleeding.  Blood pressure reasonably controlled at 140/82 mmHg on currently prescribed CCB (amlodipine), ACEi (benazepril), beta-blocker (carvedilol), diuretic (triamterene/HCTZ), and alpha-blocker (doxazosin) therapies.  Patient is not currently on any type of lipid-lowering therapies for his HLD and ASCVD prevention.  Patient is not diabetic.  He does not have an OSAH diagnosis. Functional capacity limited by age and underlying cardiovascular diagnoses, however patient still felt to be able to achieve at least 4 METS of activity without  angina/anginal equivalent symptoms.  No changes were made to his medication regimen.  Patient follow-up  with outpatient cardiology in 6 months or sooner if needed.  Casey Gallegos recently underwent CT imaging of the chest on 04/24/2022 revealing a multilobulated LEFT upper lobe perihilar mass measuring 5.3 x 3.7 x 2.8 cm with associated LEFT hilar and mediastinal lymphadenopathy.  Subsequent PET CT scan performed on 05/07/2022 revealed that mass was hypermetabolic with a maximum SUV of 15.1.  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  Patient has been seen in consult by pulmonary medicine Patsey Berthold, MD).  He has been scheduled for a FLEXIBLE BRONCHOSCOPY; [place] ON 05/19/2022. Given patient's past medical history significant for cardiovascular diagnoses, presurgical cardiovascular clearance has been sought by the PAT team.  Per cardiology, "the patient is at the lowest risk possible for perioperative cardiovascular complications with the planned procedure.  The overall risk his procedure is low (<1%).  Currently has no evidence active and/or significant angina and/or congestive heart failure. Patient may proceed to surgery without restriction or need for further cardiovascular testing at an overall LOW risk".  Again, this patient is on daily anticoagulation therapy.  He has been instructed on recommendations from his cardiologist and pulmonologist for holding his daily warfarin dose for 5 days prior to his procedure with plans to restart as soon as postoperative bleeding risk felt to be minimized by his primary attending surgeon.  Patient is aware that his last dose of warfarin should be on 05/13/2022.  Per cardiology, patient will not require perioperative enoxaparin bridging for this procedure.  Patient denies previous perioperative  complications with anesthesia in the past. In review of the available records, it is noted that patient underwent a general anesthetic course here at [facility] (ASA III) in 09/2021 without documented complications.      05/06/2022    2:34 PM 05/02/2022    9:41 AM 04/22/2022    2:46 PM  Vitals with BMI  Height _0     Weight 159 lbs 10 oz 152 lbs 10 oz 150 lbs  BMI 99.35    Systolic 701 779 390  Diastolic 84 86 72  Pulse 79 63 79    Providers/Specialists:   NOTE: Primary physician provider listed below. Patient may have been seen by APP or partner within same practice.   PROVIDER ROLE / SPECIALTY LAST OV  Tyler Pita, MD Pulmonary Medicine (Surgeon) 05/06/2022  [name], MD Primary Care Provider 04/22/2022  Serafina Royals, MD Cardiology 05/16/2022  Sharlet Salina, MD Physiatry 11/18/2021  Randa Evens, MD Medical Oncology 05/02/2022   Allergies:  Latex, Cefdinir, Doxycycline, Prednisone, and Sertraline  Current Home Medications:   No current facility-administered medications for this encounter.    amLODipine (NORVASC) 5 MG tablet   benazepril (LOTENSIN) 40 MG tablet   carvedilol (COREG) 6.25 MG tablet   Cyanocobalamin (VITAMIN B 12 PO)   doxazosin (CARDURA) 8 MG tablet   finasteride (PROSCAR) 5 MG tablet   gabapentin (NEURONTIN) 300 MG capsule   Magnesium 500 MG TABS   Multiple Vitamin (MULTI-VITAMIN) tablet   oxyCODONE (OXY IR/ROXICODONE) 5 MG immediate release tablet   oxyCODONE (OXY IR/ROXICODONE) 5 MG immediate release tablet   oxyCODONE (OXY IR/ROXICODONE) 5 MG immediate release tablet   triamterene-hydrochlorothiazide (MAXZIDE-25) 37.5-25 MG tablet   warfarin (COUMADIN) 4 MG tablet   History:   Past Medical History:  Diagnosis Date   A-fib ([place])    a.) CHA2DS2-VASc =  6 (age x2, HTN, DVT x2, vascular disease history). b.) rate/rhythm maintained on oral carvedilol; chronically anticoagulated using  daily warfarin.   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   Bladder calculi    BPH (benign prostatic hyperplasia)    DDD (degenerative disc disease), lumbar    a.) s/p LEFT laminectomy L5   Diverticulosis    DOE (dyspnea on exertion)    DVT (deep venous thrombosis) (HCC)    Elevated PSA    GERD (gastroesophageal reflux disease)    H/O degenerative disc disease    HLD (hyperlipidemia)    Hypertension    Lipoma of arm    Lipomatosis    Long term current use of anticoagulant    a.) warfarin   Mass of upper lobe of left lung 04/24/2022   a.)  CT chest 04/24/2022: Multilobulated perihilar mass measuring 5.3 x 3.7 x 2.8 cm with associated LEFT hilar and mediastinal LAD; b.)  PET CT 17/51/0258: Hypermetabolic LEFT upper lobe lung mass (max SUV 15.1); ipsilateral nodal metastasis --> presuming a NSC histology, 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   NICM (nonischemic cardiomyopathy) ([place]) 11/22/2015   a.) TTE 11/22/2015: EF 40%. b.) TTE 12/27/2018: EF 45%. c.) TTE 08/30/2020: EF 45-50%.   Night sweats    Obesity    Osteoarthritis    RBBB (right bundle branch block)    Right inguinal hernia    VHD (valvular heart disease) 11/22/2015   a.) TTE 11/22/2015: EF 40%; mod MR, mod-sev TR, triv PR; mod BAE, mild RV enlargement. b.) TTE 12/27/2018: EF 45%; mod MR, mod-sev TR, triv PR; mod BAE, mild RV enlargement. c.) TTE 08/30/2020: EF 45-50%; mild MR/TR.   Past Surgical History:  Procedure Laterality Date   Bilateral Radical Keratotomy Bilateral 1997   COLONOSCOPY WITH PROPOFOL N/A 01/11/2016   Procedure: COLONOSCOPY WITH PROPOFOL;  Surgeon: Manya Silvas, MD;  Location: [facility] ENDOSCOPY;  Service: Endoscopy;  Laterality: N/A; repeat 3 years,  tubular adenoma   DENTAL SURGERY     Patient had implants   GUM SURGERY     L4 - S1 decompression Left    PROSTATE BIOPSY     TONSILLECTOMY AND ADENOIDECTOMY  age 63   XI ROBOTIC ASSISTED INGUINAL HERNIA REPAIR WITH MESH Right 10/21/2021   Procedure: XI ROBOTIC ASSISTED INGUINAL HERNIA REPAIR WITH MESH;  Surgeon: Ronny Bacon, MD;  Location: [facility] ORS;  Service: General;  Laterality: Right;   Family History  Problem Relation Age of Onset   Cancer Mother        secondary to cancinoma of the jaw from her dipping snuff.   Heart attack Father    CAD Father    Hypertension Sister    Diabetes Son        Type I diabetes   Prostate cancer Neg Hx    Kidney cancer Neg Hx    Social History   Tobacco Use   Smoking status: Former    Types: Cigarettes    Quit date: 08/26/1971    Years since quitting: 50.7    Passive exposure: Never   Smokeless tobacco: Never   Tobacco comments:    Smoked for about 20 years.  Vaping Use   Vaping Use:  Never used  Substance Use Topics   Alcohol use: Not Currently   Drug use: No    Pertinent Clinical Results:  LABS: Labs reviewed: Acceptable for surgery.  Lab Results  Component Value Date   WBC 7.6 04/22/2022   HGB 12.8 (L) 04/22/2022   HCT 37.3 (L) 04/22/2022   MCV 93 04/22/2022   PLT 212 04/22/2022   Lab Results  Component Value Date   NA 141 04/22/2022   K 4.0 04/22/2022   CO2 23 04/22/2022   GLUCOSE 100 (H) 04/22/2022   BUN 18 04/22/2022   CREATININE 1.21 04/22/2022   CALCIUM 9.3 04/22/2022   EGFR 59 (L) 04/22/2022   GFRNONAA >60 10/18/2021    ECG: Date: 05/16/2022 Time:0903 AM Rate: 51 bpm Rhythm: atrial fibrillation; RBBB Axis (leads I and aVF): Normal axis Intervals: QRS 140 ms. QTc 486 ms. ST segment and T wave changes: No evidence of acute ST segment elevation or depression Comparison: Similar to previous tracing obtained on 06/04/2021   IMAGING / PROCEDURES: NM PET IMAGE INITIAL (PI) SKULL BASE TO THIGH performed on  05/07/2022 Left upper lobe primary bronchogenic carcinoma with ipsilateral nodal metastasis.  Presuming a non-small cell histology, consistent with T3 N2 M0 or stage IIIb disease. Aortic atherosclerosis Coronary artery atherosclerosis Emphysema Prostatomegaly with bladder calculi  DG ESOPHAGUS W DOUBLE CM performed on 05/05/2022 Multiple episodes of tracheal aspiration with spontaneous cough reflex Recommend further evaluation with speech pathology consultation  CT CHEST ABDOMEN PELVIS W CONTRAST performed on 04/24/2022 Multilobulated perihilar mass LEFT upper lobe 5.5 x 3.7 x 2.8 cm consistent with neoplasm. Associated LEFT hilar and mediastinal adenopathy. Some peripheral atelectasis in LEFT upper lobe as well as bronchial opacification question mucous plugging versus tumor. 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 Nonspecific 8 x 7 mm low-attenuation lesion LEFT lobe liver. Mild sigmoid diverticulosis without evidence of diverticulitis. Significant prostatic enlargement with small bladder calculi. Aortic atherosclerosis   LONG TERM CARDIAC EVENT MONITOR STUDY performed on 06/04/2021 Predominant underlying atrial fibrillation with occasional PVCs.   Evidence of sick sinus syndrome and asymptomatic bradycardia with long RR intervals of >3 seconds noted.   Discussed potential for PPM placement for sick sinus syndrome if/when patient becomes more symptomatic.  TRANSTHORACIC ECHOCARDIOGRAM performed on 08/30/2020 LVEF 45-50% Mild left ventricular systolic dysfunction Normal right ventricular systolic function Mild MR and TR No AR or PR No valvular stenosis No pericardial effusion  Impression and Plan:  Casey Gallegos has been referred for pre-anesthesia review and clearance prior to him undergoing the planned anesthetic and procedural courses. Available labs,  pertinent testing, and imaging results were personally reviewed by me. This patient has been appropriately cleared by cardiology with an overall LOW risk of significant perioperative cardiovascular complications.  Based on clinical review performed today (05/16/22), barring any significant acute changes in the patient's overall condition, it is anticipated that he will be able to proceed with the planned surgical intervention. Any acute changes in clinical condition may necessitate his procedure being postponed and/or cancelled. Patient will meet with anesthesia team (MD and/or CRNA) on the day of his procedure for preoperative evaluation/assessment. Questions regarding anesthetic course will be fielded at that time.   Pre-surgical instructions were reviewed with the patient during his PAT appointment and questions were fielded by PAT clinical staff. Patient was advised that if any questions or concerns arise prior to his procedure  then he should return a call to PAT and/or his surgeon's office to discuss.  Honor Loh, MSN, APRN, FNP-C, CEN [facility]  Peri-operative Services Nurse Practitioner Phone: [phone] Fax: [phone] 05/16/22 10:39 AM  NOTE: This note has been prepared using Dragon dictation software. Despite my best ability to proofread, there is always the potential that unintentional transcriptional errors may still occur from this process.

## 2022-05-17 LAB — SARS CORONAVIRUS 2 (TAT 6-24 HRS): SARS Coronavirus 2: NEGATIVE

## 2022-05-19 ENCOUNTER — Encounter
Admission: RE | Disposition: A | Discharge status: Home / Self Care | Payer: Self-pay | Source: Home / Self Care | Attending: Pulmonary Disease

## 2022-05-19 ENCOUNTER — Ambulatory Visit: Payer: Medicare HMO

## 2022-05-19 ENCOUNTER — Ambulatory Visit: Payer: Medicare HMO | Admitting: Urgent Care

## 2022-05-19 ENCOUNTER — Other Ambulatory Visit: Payer: Self-pay

## 2022-05-19 ENCOUNTER — Ambulatory Visit
Admission: RE | Admit: 2022-05-19 | Discharge: 2022-05-19 | Disposition: A | Discharge status: Home / Self Care | Payer: Medicare HMO | Attending: Pulmonary Disease | Admitting: Pulmonary Disease

## 2022-05-19 ENCOUNTER — Other Ambulatory Visit: Payer: Medicare HMO

## 2022-05-19 ENCOUNTER — Encounter: Payer: Self-pay | Admitting: Pulmonary Disease

## 2022-05-19 DIAGNOSIS — Z79899 Other long term (current) drug therapy: Secondary | ICD-10-CM | POA: Diagnosis not present

## 2022-05-19 DIAGNOSIS — I700 Atherosclerosis of aorta: Secondary | ICD-10-CM | POA: Insufficient documentation

## 2022-05-19 DIAGNOSIS — R059 Cough, unspecified: Secondary | ICD-10-CM | POA: Insufficient documentation

## 2022-05-19 DIAGNOSIS — I4819 Other persistent atrial fibrillation: Secondary | ICD-10-CM | POA: Diagnosis not present

## 2022-05-19 DIAGNOSIS — R918 Other nonspecific abnormal finding of lung field: Secondary | ICD-10-CM | POA: Diagnosis not present

## 2022-05-19 DIAGNOSIS — R590 Localized enlarged lymph nodes: Secondary | ICD-10-CM

## 2022-05-19 DIAGNOSIS — I509 Heart failure, unspecified: Secondary | ICD-10-CM | POA: Diagnosis not present

## 2022-05-19 DIAGNOSIS — C3412 Malignant neoplasm of upper lobe, left bronchus or lung: Secondary | ICD-10-CM | POA: Diagnosis not present

## 2022-05-19 DIAGNOSIS — C7802 Secondary malignant neoplasm of left lung: Secondary | ICD-10-CM | POA: Diagnosis not present

## 2022-05-19 DIAGNOSIS — T17808A Unspecified foreign body in other parts of respiratory tract causing other injury, initial encounter: Secondary | ICD-10-CM | POA: Insufficient documentation

## 2022-05-19 DIAGNOSIS — I110 Hypertensive heart disease with heart failure: Secondary | ICD-10-CM | POA: Insufficient documentation

## 2022-05-19 DIAGNOSIS — I428 Other cardiomyopathies: Secondary | ICD-10-CM | POA: Insufficient documentation

## 2022-05-19 DIAGNOSIS — N400 Enlarged prostate without lower urinary tract symptoms: Secondary | ICD-10-CM | POA: Insufficient documentation

## 2022-05-19 DIAGNOSIS — X58XXXA Exposure to other specified factors, initial encounter: Secondary | ICD-10-CM | POA: Diagnosis not present

## 2022-05-19 DIAGNOSIS — Z86718 Personal history of other venous thrombosis and embolism: Secondary | ICD-10-CM | POA: Diagnosis not present

## 2022-05-19 DIAGNOSIS — Z87891 Personal history of nicotine dependence: Secondary | ICD-10-CM | POA: Diagnosis not present

## 2022-05-19 DIAGNOSIS — Z01812 Encounter for preprocedural laboratory examination: Secondary | ICD-10-CM

## 2022-05-19 DIAGNOSIS — Z7901 Long term (current) use of anticoagulants: Secondary | ICD-10-CM

## 2022-05-19 DIAGNOSIS — C3492 Malignant neoplasm of unspecified part of left bronchus or lung: Secondary | ICD-10-CM | POA: Diagnosis not present

## 2022-05-19 HISTORY — PX: FLEXIBLE BRONCHOSCOPY: SHX5094

## 2022-05-19 HISTORY — DX: Diverticulosis of intestine, part unspecified, without perforation or abscess without bleeding: K57.90

## 2022-05-19 HISTORY — DX: Calculus in bladder: N21.0

## 2022-05-19 HISTORY — DX: Other intervertebral disc degeneration, lumbar region: M51.36

## 2022-05-19 HISTORY — DX: Cyst of kidney, acquired: N28.1

## 2022-05-19 HISTORY — PX: VIDEO BRONCHOSCOPY WITH ENDOBRONCHIAL ULTRASOUND: SHX6177

## 2022-05-19 HISTORY — DX: Other intervertebral disc degeneration, lumbar region without mention of lumbar back pain or lower extremity pain: M51.369

## 2022-05-19 HISTORY — DX: Atherosclerosis of aorta: I70.0

## 2022-05-19 LAB — PROTIME-INR
INR: 1.2 (ref 0.8–1.2)
Prothrombin Time: 15.4 seconds — ABNORMAL HIGH (ref 11.4–15.2)

## 2022-05-19 SURGERY — BRONCHOSCOPY, FLEXIBLE
Anesthesia: General | Laterality: Left

## 2022-05-19 MED ORDER — DEXAMETHASONE SODIUM PHOSPHATE 10 MG/ML IJ SOLN
INTRAMUSCULAR | Status: DC | PRN
  Administered 2022-05-19: 10 mg via INTRAVENOUS

## 2022-05-19 MED ORDER — LACTATED RINGERS IV SOLN: INTRAVENOUS | Status: DC

## 2022-05-19 MED ORDER — PROPOFOL 10 MG/ML IV BOLUS
INTRAVENOUS | Status: AC
  Filled 2022-05-19: qty 20

## 2022-05-19 MED ORDER — SUGAMMADEX SODIUM 200 MG/2ML IV SOLN
INTRAVENOUS | Status: DC | PRN
  Administered 2022-05-19: 200 mg via INTRAVENOUS

## 2022-05-19 MED ORDER — AZITHROMYCIN 500 MG PO TABS: 500.0000 mg | ORAL_TABLET | Freq: Every day | ORAL | 0 refills | Status: AC

## 2022-05-19 MED ORDER — OXYCODONE HCL 5 MG/5ML PO SOLN: 5.0000 mg | Freq: Once | ORAL | Status: DC | PRN

## 2022-05-19 MED ORDER — LIDOCAINE HCL (CARDIAC) PF 100 MG/5ML IV SOSY
PREFILLED_SYRINGE | INTRAVENOUS | Status: DC | PRN
  Administered 2022-05-19: 60 mg via INTRAVENOUS

## 2022-05-19 MED ORDER — CHLORHEXIDINE GLUCONATE 0.12 % MT SOLN: 15.0000 mL | Freq: Once | OROMUCOSAL | Status: AC

## 2022-05-19 MED ORDER — FENTANYL CITRATE (PF) 100 MCG/2ML IJ SOLN: 25.0000 ug | INTRAMUSCULAR | Status: DC | PRN

## 2022-05-19 MED ORDER — EPHEDRINE SULFATE (PRESSORS) 50 MG/ML IJ SOLN
INTRAMUSCULAR | Status: DC | PRN
  Administered 2022-05-19: 10 mg via INTRAVENOUS

## 2022-05-19 MED ORDER — OXYCODONE HCL 5 MG PO TABS: 5.0000 mg | ORAL_TABLET | Freq: Once | ORAL | Status: DC | PRN

## 2022-05-19 MED ORDER — ACETAMINOPHEN 10 MG/ML IV SOLN: 1000.0000 mg | Freq: Once | INTRAVENOUS | Status: DC | PRN

## 2022-05-19 MED ORDER — ROCURONIUM BROMIDE 100 MG/10ML IV SOLN
INTRAVENOUS | Status: DC | PRN
  Administered 2022-05-19: 50 mg via INTRAVENOUS

## 2022-05-19 MED ORDER — CHLORHEXIDINE GLUCONATE 0.12 % MT SOLN
OROMUCOSAL | Status: AC
  Administered 2022-05-19: 15 mL via OROMUCOSAL
  Filled 2022-05-19: qty 15

## 2022-05-19 MED ORDER — ONDANSETRON HCL 4 MG/2ML IJ SOLN: 4.0000 mg | Freq: Once | INTRAMUSCULAR | Status: DC | PRN

## 2022-05-19 MED ORDER — FENTANYL CITRATE (PF) 100 MCG/2ML IJ SOLN
INTRAMUSCULAR | Status: AC
  Filled 2022-05-19: qty 2

## 2022-05-19 MED ORDER — ORAL CARE MOUTH RINSE: 15.0000 mL | Freq: Once | OROMUCOSAL | Status: AC

## 2022-05-19 MED ORDER — PROPOFOL 10 MG/ML IV BOLUS
INTRAVENOUS | Status: DC | PRN
  Administered 2022-05-19: 100 mg via INTRAVENOUS

## 2022-05-19 MED ORDER — ONDANSETRON HCL 4 MG/2ML IJ SOLN
INTRAMUSCULAR | Status: DC | PRN
  Administered 2022-05-19: 4 mg via INTRAVENOUS

## 2022-05-19 MED ORDER — FENTANYL CITRATE (PF) 100 MCG/2ML IJ SOLN
INTRAMUSCULAR | Status: DC | PRN
  Administered 2022-05-19: 50 ug via INTRAVENOUS
  Administered 2022-05-19 (×2): 25 ug via INTRAVENOUS

## 2022-05-19 NOTE — Anesthesia Preprocedure Evaluation (Addendum)
Anesthesia Evaluation  Patient identified by MRN, date of birth, ID band Patient awake    Reviewed: Allergy & Precautions, NPO status , Patient's Chart, lab work & pertinent test results  History of Anesthesia Complications Negative for: history of anesthetic complications  Airway Mallampati: III   Neck ROM: Full    Dental  (+) Missing, Implants   Pulmonary former smoker (quit 1973),    Pulmonary exam normal breath sounds clear to auscultation       Cardiovascular hypertension, +CHF (NICM) and + DVT  + dysrhythmias (a fib on warfarin)  Rhythm:Irregular Rate:Normal  ECG 05/16/22:  Atrial fibrillation with premature ventricular or aberrantly conducted complexes Right bundle branch block  Echo 08/30/20:  MILD LV SYSTOLIC DYSFUNCTION  NORMAL RIGHT VENTRICULAR SYSTOLIC FUNCTION  MILD VALVULAR REGURGITATION   NO VALVULAR STENOSIS  MILD MR, TR  EF 45-50%    Neuro/Psych PSYCHIATRIC DISORDERS (ADHD) negative neurological ROS     GI/Hepatic negative GI ROS,   Endo/Other  negative endocrine ROS  Renal/GU negative Renal ROS     Musculoskeletal  (+) Arthritis ,   Abdominal   Peds  Hematology  (+) Blood dyscrasia, anemia ,   Anesthesia Other Findings Reviewed and agree with Bayard Males pre-anesthesia clinical review note.   Cardiology note 06/27/21:  Plan  -There has been a review of medication management for hypertension control and of the current medical regimen used. The patient understands all the risks and benefits of hypertension control with these medications to reduce possible future cardiovascular complications and risk of disease. There will be no changes in medication regimen necessary today. -No further cardiovascular intervention and/or medication additions due to only minimal abnormality of cholesterol levels and low 10 year cardiovascular risk score. The patient will continue healthy lifestyle measures to  achieve appropriate cardiovascular risk reduction. -Continue current treatment plan for heart rate control of atrial fibrillation and/or maintenance of normal sinus rhythm. There appears to be no current evidence of sick sinus syndrome and/or significant worsening tachycardia and/or heart block. The patient will watch closely for any new significant symptoms of dizziness, weakness, palpitations, atrial fibrillation recurrence and/or need for adjustments of medication management. -The patient will continue warfarin for risk reduction of stroke with atrial fibrillation and/or atrial flutter. The patient continues to understand all risks and benefits from the use of this medication including bleeding, medication interactions, and bruising. The patient has had discussion of alternatives to warfarin use for anticoagulation and the comparisons of risk reduction as well as side effects and complication. The current goal INR is 2-3. - consider to Proceed to [employer] micro-pacemaker implant for sick sinus syndrome and/or symptomatic bradycardia if and when patient has more sx . The patient understands the risks and benefits of this implant which include the rare possibility of death, stroke, femoral infection, heart attack, femoral bleeding, hemopericardium, and dislodgment. The patient is at low risk for conscious sedation. But will defer for now  No orders of the defined types were placed in this encounter.  Return in about 9 months (around 03/27/2022).   Reproductive/Obstetrics                            Anesthesia Physical Anesthesia Plan  ASA: 3  Anesthesia Plan: General   Post-op Pain Management:    Induction: Intravenous  PONV Risk Score and Plan: 2 and Ondansetron, Dexamethasone and Treatment may vary due to age or medical condition  Airway Management Planned: Oral ETT  Additional Equipment:   Intra-op Plan:   Post-operative Plan: Extubation in OR  Informed  Consent: I have reviewed the patients History and Physical, chart, labs and discussed the procedure including the risks, benefits and alternatives for the proposed anesthesia with the patient or authorized representative who has indicated his/her understanding and acceptance.     Dental advisory given  Plan Discussed with: CRNA  Anesthesia Plan Comments: (Patient consented for risks of anesthesia including but not limited to:  - adverse reactions to medications - damage to eyes, teeth, lips or other oral mucosa - nerve damage due to positioning  - sore throat or hoarseness - damage to heart, brain, nerves, lungs, other parts of body or loss of life  Informed patient about role of CRNA in peri- and intra-operative care.  Patient voiced understanding.)        Anesthesia Quick Evaluation

## 2022-05-19 NOTE — Interval H&P Note (Signed)
Casey Gallegos has presented today for surgery, with the diagnosis of LEFT lung mass.  The various methods of treatment have been discussed with the patient and family. After consideration of risks, benefits and other options for treatment, the patient has consented to  Procedure(s): BRONCHOSCOPY WITH ENDOBRONCHIAL ULTRASOUND AND BIOPSIES AS NECESSARY.  BODY VISION MAY BE UTILIZED.  On LEFT as a surgical intervention.  The patient's history has been reviewed, patient examined, no change in status, stable for surgery.  I have reviewed the patient's chart and labs.  Questions were answered to the patient's satisfaction.  Benefits, limitations and potential complications of the procedure were discussed with the patient/family.  Complications from bronchoscopy are rare and most often minor, but if they occur they may include breathing difficulty, vocal cord spasm, hoarseness, slight fever, vomiting, dizziness, bronchospasm, infection, low blood oxygen, bleeding from biopsy site, or an allergic reaction to medications.  It is uncommon for patients to experience other more serious complications for example: Collapsed lung requiring chest tube placement, respiratory failure, heart attack and/or cardiac arrhythmia.  Patient has agreed to proceed.  Renold Don, MD Advanced Bronchoscopy PCCM [facility]    *This note was dictated using voice recognition software/Dragon.  Despite best efforts to proofread, errors can occur which can change the meaning. Any transcriptional errors that result from this process are unintentional and may not be fully corrected at the time of dictation.

## 2022-05-19 NOTE — Discharge Instructions (Addendum)
We have sent an antibiotic to take at home for the next 3 days.  There was some infection noted during your procedure today.  Start the antibiotic tonight.  Expect to have a little bit of blood when you cough because of the biopsies that were taken today.  This should not cause alarm.  If you cough more than a tablespoon at a time then you should come to the emergency room to be evaluated.  However, this is not expected you may just see some streaks of blood and perhaps a clot or 2.  DO NOT START YOUR COUMADIN UNTIL WEDNESDAY EVENING.  We should have the results of your biopsy hopefully by Wednesday or the very latest Friday.    AMBULATORY SURGERY  DISCHARGE INSTRUCTIONS   The drugs that you were given will stay in your system until tomorrow so for the next 24 hours you should not:  Drive an automobile Make any legal decisions Drink any alcoholic beverage   You may resume regular meals tomorrow.  Today it is better to start with liquids and gradually work up to solid foods.  You may eat anything you prefer, but it is better to start with liquids, then soup and crackers, and gradually work up to solid foods.   Please notify your doctor immediately if you have any unusual bleeding, trouble breathing, redness and pain at the surgery site, drainage, fever, or pain not relieved by medication.    Additional Instructions:        Please contact your physician with any problems or Same Day Surgery at [phone], Monday through Friday 6 am to 4 pm, or [place] at [facility] number at [phone].

## 2022-05-19 NOTE — Transfer of Care (Signed)
Immediate Anesthesia Transfer of Care Note  Patient: JAMION CARTER  Procedure(s) Performed: FLEXIBLE BRONCHOSCOPY (Left) VIDEO BRONCHOSCOPY WITH ENDOBRONCHIAL ULTRASOUND (Left)  Patient Location: PACU  Anesthesia Type:General  Level of Consciousness: drowsy  Airway & Oxygen Therapy: Patient Spontanous Breathing and Patient connected to face mask oxygen  Post-op Assessment: Report given to RN  Post vital signs: stable  Last Vitals:  Vitals Value Taken Time  BP 156/87 05/19/22 1345  Temp 36.1 C 05/19/22 1343  Pulse 80 05/19/22 1348  Resp 15 05/19/22 1348  SpO2 100 % 05/19/22 1348  Vitals shown include unvalidated device data.  Last Pain:  Vitals:   05/19/22 1343  TempSrc:   PainSc: Asleep      Patients Stated Pain Goal: 0 (05/69/79 4801)  Complications: No notable events documented.

## 2022-05-19 NOTE — Op Note (Signed)
PROCEDURES:   BRONCHOSCOPY with ENDOBRONCHIAL BIOPSY ENDOBRONCHIAL ULTRASOUND with TBNA   PROCEDURE DATE: 05/19/2022  TIME: 1300 hrs. NAME:  Casey Gallegos  DOB:19-Jul-1935  MRN: [id number] LOC:  ARPO/None    HOSP DAY: N/A    Indications/Preliminary Diagnosis: Upper lobe mass with hilar adenopathy rule out cancer  Consent: (Place X beside choice/s below)  The benefits, risks and possible complications of the procedure were        explained to:  _X__ patient  ___ patient's family  ___ other:___________  who verbalized understanding and gave:  ___ verbal  ___ written  _X__ verbal and written  ___ telephone  ___ other:________ consent.      Unable to obtain consent; procedure performed on emergent basis.     Other:    Benefits, limitations and potential complications of the procedure were discussed with the patient/family.  Complications from bronchoscopy are rare and most often minor, but if they occur they may include breathing difficulty, vocal cord spasm, hoarseness, slight fever, vomiting, dizziness, bronchospasm, infection, low blood oxygen, bleeding from biopsy site, or an allergic reaction to medications.  It is uncommon for patients to experience other more serious complications for example: Collapsed lung requiring chest tube placement, respiratory failure, heart attack and/or cardiac arrhythmia.  Patient agrees to proceed.    Anesthesia type: General endotracheal  Surgeon: Renold Don, MD Assistant/Scrub: Sullivan Lone, RRT Anesthesiologist/CRNA: Darrin Nipper, MD/Rhonda Arlan Organ, CRNA Cytotechnology: Maryan Puls, available and present   PROCEDURE DETAILS: Patient was taken to Procedure Room 2 (Bronchoscopy Suite) in the OR area.  Appropriate Timeout performed and correct patient, name, ID and laterality confirmed.  Patient was inducted under general anesthesia and intubated with an 8.5 ET tube without difficulty.  Once the patient was under adequate general anesthesia a  Portex adapter was placed in the ET tube flange.  Through the Portex adapter the Olympus video therapeutic bronchoscope was then advanced.  The visible distal trachea was normal, carina appeared sharp.  Examination of the right tracheobronchial tree showed that the right upper lobe , right middle lobe, lower lobe bronchi were free of endobronchial lesions or any other abnormality.  At this point bronchoscope was brought to the left mainstem bronchus and examination revealed an endobronchial lesion at the left upper lobe anterior bronchus  (B3).  The lingula subsegment, lower lobe bronchi were without endobronchial lesions.  At this point the lesion was blanched with 3 mL of 1-10,000 epinephrine and immediately upon instilling this the lesion started oozing.  The necrotic debris was lavaged until clear.  A friable lesion could be seen beyond this.  At this point a total of 6 endobronchial biopsies were obtained with ConMed Precisor forceps.  The first biopsy was subjected to ROSE and lesional cells were confirmed.  The patient had to undergo an additional 3 mL of 1-10,000 epinephrine instillation and lavaged with iced saline due to oozing from the biopsy site.  During the saline lavage a bronchoalveolar lavage collection was performed.  Was a total of 40 mL's of ice saline instilled yielding 12 mL of aliquot.  This provided good hemostasis.  Of note, admixed with the blood oozing there was also significant purulent secretions indicating a postobstructive process.  After completing this, the bronchoscope was switched to an endobronchial ultrasound scope (EBUS scope) and the mediastinum was examined.  There was significant adenopathy at station 4L.  There was no significant adenopathy on the left hilum, and no significant adenopathy noted in the subcarinal area.  We  proceeded to sample the 4L station.  ROSE revealed no cells.  A total of 2 passes were performed these were placed in preservative for further analysis.   Having completed this, the EBUS scope was retrieved and again replaced for the therapeutic video bronchoscope.  The airway was then examined thoroughly to ensure adequate hemostasis.  After examination and inspection and noting excellent hemostasis, the patient received 9 mL of 1% lidocaine via bronchial lavage prior to retrieving the bronchoscope.  At this point the procedure was terminated and the patient was allowed to emerge from general anesthesia.  Patient was extubated in the procedure room and transported to the PACU in satisfactory condition.  The patient did not have any complaints of chest pain or shortness of breath postprocedure.  Symmetrical lung sounds were noted postprocedure.  Chest x-ray showed no pneumothorax.  Patient tolerated the procedure well.   SPECIMENS (Sites): (Place X beside choice below)  Specimens Description   No Specimens Obtained     Washings   X Lavage LUL, 12 mL  X Biopsies LUL x 6  X EBUS TBNA Station 4L x 2   Brushings    Sputum    FINDINGS:  Left upper lobe (B3) necrotic appearing endobronchial mass occluding the bronchus (arrow):   Close above lesion above:   Lesion at the beginning of biopsies, some oozing of blood noted, lesion is very friable:   Post biopsies excellent hemostasis:   4L lymph node 2 cm diameter:   EBUS needle in 4L lymph node:    ESTIMATED BLOOD LOSS: Less than 10 mL    COMPLICATIONS/RESOLUTION: none, post procedure chest x-ray showed no pneumothorax:    IMPRESSION:POST-PROCEDURE DX:  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 Derrill Kay, MD Advanced Bronchoscopy PCCM [facility]    *This note was dictated using voice recognition software/Dragon.  Despite best efforts to proofread, errors can occur which can change the meaning.  Any change was purely unintentional.

## 2022-05-19 NOTE — Anesthesia Procedure Notes (Signed)
Procedure Name: Intubation Date/Time: 05/19/2022 12:53 PM  Performed by: Lerry Liner, CRNAPre-anesthesia Checklist: Patient identified, Emergency Drugs available, Suction available and Patient being monitored Patient Re-evaluated:Patient Re-evaluated prior to induction Oxygen Delivery Method: Circle system utilized Preoxygenation: Pre-oxygenation with 100% oxygen Induction Type: IV induction Ventilation: Mask ventilation without difficulty Laryngoscope Size: McGraph and 4 Grade View: Grade I Tube type: Oral Tube size: 8.5 mm Number of attempts: 1 Airway Equipment and Method: Stylet and Oral airway Placement Confirmation: ETT inserted through vocal cords under direct vision, positive ETCO2 and breath sounds checked- equal and bilateral Secured at: 23 cm Tube secured with: Tape Dental Injury: Teeth and Oropharynx as per pre-operative assessment

## 2022-05-19 NOTE — Anesthesia Postprocedure Evaluation (Signed)
Anesthesia Post Note  [name]  Procedure(s) Performed: FLEXIBLE BRONCHOSCOPY (Left) VIDEO BRONCHOSCOPY WITH ENDOBRONCHIAL ULTRASOUND (Left)  Patient location during evaluation: PACU Anesthesia Type: General Level of consciousness: awake and alert, oriented and patient cooperative Pain management: pain level controlled Vital Signs Assessment: post-procedure vital signs reviewed and stable Respiratory status: spontaneous breathing, nonlabored ventilation and respiratory function stable Cardiovascular status: blood pressure returned to baseline and stable Postop Assessment: adequate PO intake Anesthetic complications: no   No notable events documented.   Last Vitals:  Vitals:   05/19/22 1430 05/19/22 1440  BP: (!) 151/96 (!) 163/87  Pulse: 76 74  Resp: 15 16  Temp: (!) 36.1 C (!) 36.2 C  SpO2: 97% 98%    Last Pain:  Vitals:   05/19/22 1440  TempSrc: Temporal  PainSc: 0-No pain                 Darrin Nipper

## 2022-05-20 ENCOUNTER — Other Ambulatory Visit: Payer: Self-pay | Admitting: Specialist

## 2022-05-20 ENCOUNTER — Encounter: Payer: Self-pay | Admitting: Pulmonary Disease

## 2022-05-20 LAB — CYTOLOGY - NON PAP

## 2022-05-20 LAB — SURGICAL PATHOLOGY

## 2022-05-21 ENCOUNTER — Telehealth: Payer: Self-pay | Admitting: Pulmonary Disease

## 2022-05-21 NOTE — Telephone Encounter (Signed)
Spoke to patient's spouse, Dottie(DPR) and relayed below message.  06/20/2022 appt has been canceled. Appt scheduled 08/20/2022. Nothing further needed.   Tyler Pita, MD  Linwood Dibbles, CMA I have made the patient and his wife aware of the results.  He has appropriate oncology follow-ups on 2 October.  Patient would like to postpone follow-up visit with me on 27 October.  This can be moved out to 3 months.  He had no untoward side effects from bronchoscopy procedure.

## 2022-05-22 ENCOUNTER — Ambulatory Visit
Admission: RE | Admit: 2022-05-22 | Discharge: 2022-05-22 | Disposition: A | Discharge status: Home / Self Care | Payer: Medicare HMO | Source: Ambulatory Visit | Attending: Pulmonary Disease | Admitting: Pulmonary Disease

## 2022-05-22 DIAGNOSIS — R1312 Dysphagia, oropharyngeal phase: Secondary | ICD-10-CM | POA: Insufficient documentation

## 2022-05-22 DIAGNOSIS — R1310 Dysphagia, unspecified: Secondary | ICD-10-CM | POA: Diagnosis not present

## 2022-05-22 DIAGNOSIS — R059 Cough, unspecified: Secondary | ICD-10-CM | POA: Insufficient documentation

## 2022-05-22 NOTE — Addendum Note (Signed)
Encounter addended by: Clapp, [place] J, [place] on: 05/22/2022 4:07 PM  Actions taken: Charge Capture section accepted, Flowsheet accepted

## 2022-05-22 NOTE — Evaluation (Addendum)
Objective Swallowing Evaluation: Type of Study: MBS-Modified Barium Swallow Study   Patient Details  Name: Casey Gallegos MRN: [id number] Date of Birth: 12/25/1934  Today's Date: 05/22/2022 Time: SLP Start Time (ACUTE ONLY): 1300 -SLP Stop Time (ACUTE ONLY): 1400  SLP Time Calculation (min) (ACUTE ONLY): 60 min   Past Medical History:  Past Medical History:  Diagnosis Date   A-fib ([place])    a.) CHA2DS2-VASc = 6 (age x2, HTN, DVT x2, vascular disease history). b.) rate/rhythm maintained on oral carvedilol; chronically anticoagulated using daily warfarin.   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   Bladder calculi    BPH (benign prostatic hyperplasia)    DDD (degenerative disc disease), lumbar    a.) s/p LEFT laminectomy L5   Diverticulosis    DOE (dyspnea on exertion)    DVT (deep venous thrombosis) ([facility])    Elevated PSA    GERD (gastroesophageal reflux disease)    H/O degenerative disc disease    HLD (hyperlipidemia)    Hypertension    Lipoma of arm    Lipomatosis    Long term current use of anticoagulant    a.) warfarin   Mass of upper lobe of left lung 04/24/2022   a.)  CT chest 04/24/2022: Multilobulated perihilar mass measuring 5.3 x 3.7 x 2.8 cm with associated LEFT hilar and mediastinal LAD; b.)  PET CT 07/68/0881: Hypermetabolic LEFT upper lobe lung mass (max SUV 15.1); ipsilateral nodal metastasis --> presuming a NSC histology, 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   NICM (nonischemic cardiomyopathy) ([place]) 11/22/2015   a.) TTE 11/22/2015: EF 40%. b.) TTE 12/27/2018: EF 45%. c.) TTE 08/30/2020: EF 45-50%.   Night sweats    Obesity    Osteoarthritis    RBBB (right bundle branch block)    Right inguinal hernia    VHD  (valvular heart disease) 11/22/2015   a.) TTE 11/22/2015: EF 40%; mod MR, mod-sev TR, triv PR; mod BAE, mild RV enlargement. b.) TTE 12/27/2018: EF 45%; mod MR, mod-sev TR, triv PR; mod BAE, mild RV enlargement. c.) TTE 08/30/2020: EF 45-50%; mild MR/TR.   Past Surgical History:  Past Surgical History:  Procedure Laterality Date   Bilateral Radical Keratotomy Bilateral 1997   COLONOSCOPY WITH PROPOFOL N/A 01/11/2016   Procedure: COLONOSCOPY WITH PROPOFOL;  Surgeon: Manya Silvas, MD;  Location: [facility] ENDOSCOPY;  Service: Endoscopy;  Laterality: N/A; repeat 3 years, tubular adenoma   DENTAL SURGERY     Patient had implants   FLEXIBLE BRONCHOSCOPY Left 05/19/2022   Procedure: FLEXIBLE BRONCHOSCOPY;  Surgeon: Tyler Pita, MD;  Location: [facility] ORS;  Service: Cardiopulmonary;  Laterality: Left;   GUM SURGERY     L4 - S1 decompression Left    PROSTATE BIOPSY     TONSILLECTOMY AND ADENOIDECTOMY  age 86   VIDEO BRONCHOSCOPY WITH ENDOBRONCHIAL ULTRASOUND Left 05/19/2022   Procedure: VIDEO BRONCHOSCOPY WITH ENDOBRONCHIAL ULTRASOUND;  Surgeon: Tyler Pita, MD;  Location: [facility] ORS;  Service: Cardiopulmonary;  Laterality: Left;   XI ROBOTIC ASSISTED INGUINAL HERNIA REPAIR WITH MESH Right 10/21/2021   Procedure: XI ROBOTIC ASSISTED INGUINAL HERNIA REPAIR WITH MESH;  Surgeon: Ronny Bacon, MD;  Location: [facility] ORS;  Service: General;  Laterality: Right;  HPI: Per pulmonary appointment on 05/06/22 "Patient is an 87 year old remote former smoker who presents for evaluation of a left lung mass.  He is kindly referred by Dr. Randa Evens.  Patient is here with his wife today.  His primary care physician is Dr. Miguel Aschoff.  The patient had noted a 50 pound weight loss and upon evaluation by Dr. Rosanna Randy, a work-up ensued.  The patient had a chest abdomen and pelvis CT on 31 August.  This showed a multilobulated perihilar mass in the left upper lobe with an associated left hilar lymph node.  There  is abrupt cut off of the anterior left upper lobe bronchus with a component of plugging by mucous or tumor.  There was minimal atelectasis peripheral to the lesion.  Remaining lungs were clear.   The patient states that aside from cough during and following meals, he has not had any coughing.  No hemoptysis.  Has had the weight loss as noted above but not anorexia.  No fevers, chills or sweats.  No orthopnea or paroxysmal nocturnal dyspnea.  He has chronic lower extremity edema after an episode of DVT in the past."   Subjective: pt alert and agreeable to session    Recommendations for follow up therapy are one component of a multi-disciplinary discharge planning process, led by the attending physician.  Recommendations may be updated based on patient status, additional functional criteria and insurance authorization.  Assessment / Plan / Recommendation     05/22/2022    3:00 PM  Clinical Impressions  Clinical Impression Pt presents to objective swallow assessment in the setting of observed penetration/aspiration on recent DG esophagus. Pt currently in the beginning stages for oncology workup for left upper lung mass. Of note, pt reporting recent weight loss (50 lbs in the last few months), but that weight has potentially stabilized now. Per patient report, stringy/chalky items get "stuck" in throat, leading to discomfort and need for water for clearance. Pt denied previous hx of dysphagia or PNA.        Pt presents with significant pharyngeal dysphagia c/b reduced airway closure, laryngeal excursion, UES opening, BOT retraction, and pharyngeal constriction. Aspiration noted before the swallow secondary to material spilling into the airway and pyriform sinus prior to initiation. Reduced laryngeal excursion and airway closure contributing to penetration during the swallow for nectar thick and thin liquids via cup. Penetration primarily remaining at or above the vocal folds and was effectively cleared  from vestibule with cued throat clear. However, without cued throat clear, penetration noted to fall below the vocal folds to coat tracheal wall. Pt was not sensate to aspiration/penetration. Further, reduced BOT retraction, pharyngeal constriction, and UES opening led to severe vallecular and pyriform residue for regular and puree solids. Cued secondary, dry swallow reduced residue, yet use of liquid wash led to additional penetration of liquid. Pt was sensate to pharyngeal residue and reported increased comfort with secondary swallow. Oral phase is grossly functional with minimal residue and adequate oral clearance. Compensatory strategies (head turn and chin tuck) trialed without reduction in penetration.       Pt is at significant risk for aspiration based on above findings. Therefore, recommend strict aspiration precautions, including small bites, slow rate, intermittent throat clear, secondary dry swallow with solids, avoiding straws, and sitting upright for oral intake. Pt to continue with regular solids and thin liquids.   Further, recommend close monitoring by PCP/pulmonary/oncology, especially in the setting of initiation of intervention that could debilitate or increase sedentary lifestyle (  and thus weaken body's ability to clear lungs/prevent infection). Follow up speech therapy for further education/intervention recommended if in accordance with PCP's plan.     Assessment reviewed with pt and education provided for results of assessment, recommendations for aspiration precautions, and continued active lifestyle to help reduce risk for infection. Pt endorsed understanding.    SLP Visit Diagnosis Dysphagia, oropharyngeal phase (R13.12)  Impact on safety and function Moderate aspiration risk         05/22/2022    3:00 PM  Treatment Recommendations  Treatment Recommendations --        05/22/2022    3:00 PM  Prognosis  Barriers to Reach Goals Severity of deficits       05/22/2022     3:00 PM  Diet Recommendations  SLP Diet Recommendations Regular solids;Thin liquid  Liquid Administration via Cup;Spoon;No straw  Medication Administration Whole meds with liquid  Compensations Minimize environmental distractions;Slow rate;Small sips/bites;Clear throat intermittently;Multiple dry swallows after each bite/sip  Postural Changes Remain semi-upright after after feeds/meals (Comment);Seated upright at 90 degrees         05/22/2022    3:00 PM  Other Recommendations  Oral Care Recommendations Oral care BID  Follow Up Recommendations Follow physician's recommendations for discharge plan and follow up therapies           05/22/2022    3:00 PM  Oral Phase  Oral Phase [facility]       05/22/2022    3:00 PM  Pharyngeal Phase  Pharyngeal Phase Impaired  Pharyngeal- Nectar Teaspoon WFL  Pharyngeal Material does not enter airway  Pharyngeal- Nectar Cup Reduced airway/laryngeal closure;Reduced tongue base retraction;Penetration/Aspiration during swallow;Pharyngeal residue - pyriform;Pharyngeal residue - valleculae;Pharyngeal residue - cp segment;Reduced anterior laryngeal mobility  Pharyngeal Material enters airway, remains ABOVE vocal cords and not ejected out  Pharyngeal- Nectar Straw NT  Pharyngeal- Thin Teaspoon Penetration/Aspiration before swallow;Delayed swallow initiation-pyriform sinuses;Reduced airway/laryngeal closure;Trace aspiration;Pharyngeal residue - valleculae;Pharyngeal residue - pyriform;Pharyngeal residue - cp segment;Reduced anterior laryngeal mobility  Pharyngeal Material enters airway, passes BELOW cords without attempt by patient to eject out (silent aspiration)  Pharyngeal- Thin Cup Reduced airway/laryngeal closure;Reduced anterior laryngeal mobility;Reduced tongue base retraction;Penetration/Aspiration during swallow;Pharyngeal residue - pyriform;Pharyngeal residue - valleculae;Pharyngeal residue - cp segment  Pharyngeal Material enters airway, remains ABOVE  vocal cords and not ejected out  Pharyngeal- Thin Straw NT  Pharyngeal- Puree Reduced pharyngeal peristalsis;Reduced laryngeal elevation;Reduced airway/laryngeal closure;Pharyngeal residue - valleculae;Pharyngeal residue - pyriform;Pharyngeal residue - cp segment  Pharyngeal Material does not enter airway  Pharyngeal- Mechanical Soft NT  Pharyngeal- Regular Reduced pharyngeal peristalsis;Reduced laryngeal elevation;Reduced tongue base retraction;Pharyngeal residue - pyriform;Pharyngeal residue - valleculae;Pharyngeal residue - cp segment  Pharyngeal Material does not enter airway  Pharyngeal- Multi-consistency NT  Pharyngeal- Pill NT        05/22/2022    3:00 PM  Cervical Esophageal Phase   Cervical Esophageal Phase [facility]   [place] Alvenia Treese Clapp  MS CCC-SLP  [place] J Clapp 05/22/2022, 3:42 PM

## 2022-05-22 NOTE — Addendum Note (Signed)
Encounter addended by: Clapp, [place] J, [place] on: 05/22/2022 3:45 PM  Actions taken: Flowsheet accepted, Clinical Note Signed

## 2022-05-26 ENCOUNTER — Inpatient Hospital Stay: Payer: Medicare HMO | Admitting: Oncology

## 2022-05-26 ENCOUNTER — Ambulatory Visit
Admission: RE | Admit: 2022-05-26 | Discharge: 2022-05-26 | Disposition: A | Discharge status: Home / Self Care | Payer: Medicare HMO | Source: Ambulatory Visit | Attending: Oncology | Admitting: Oncology

## 2022-05-26 ENCOUNTER — Encounter: Payer: Self-pay | Admitting: Oncology

## 2022-05-26 ENCOUNTER — Encounter: Payer: Self-pay | Admitting: *Deleted

## 2022-05-26 ENCOUNTER — Ambulatory Visit
Admission: RE | Admit: 2022-05-26 | Discharge: 2022-05-26 | Disposition: A | Discharge status: Home / Self Care | Payer: Medicare HMO | Source: Ambulatory Visit | Attending: Radiation Oncology | Admitting: Radiation Oncology

## 2022-05-26 VITALS — BP 150/88 | HR 73 | Temp 97.2°F | Resp 16 | Wt 157.6 lb

## 2022-05-26 DIAGNOSIS — R918 Other nonspecific abnormal finding of lung field: Secondary | ICD-10-CM

## 2022-05-26 DIAGNOSIS — C3412 Malignant neoplasm of upper lobe, left bronchus or lung: Secondary | ICD-10-CM | POA: Insufficient documentation

## 2022-05-26 DIAGNOSIS — I4510 Unspecified right bundle-branch block: Secondary | ICD-10-CM | POA: Insufficient documentation

## 2022-05-26 DIAGNOSIS — M5136 Other intervertebral disc degeneration, lumbar region: Secondary | ICD-10-CM | POA: Insufficient documentation

## 2022-05-26 DIAGNOSIS — R634 Abnormal weight loss: Secondary | ICD-10-CM | POA: Insufficient documentation

## 2022-05-26 DIAGNOSIS — K219 Gastro-esophageal reflux disease without esophagitis: Secondary | ICD-10-CM | POA: Diagnosis not present

## 2022-05-26 DIAGNOSIS — R1310 Dysphagia, unspecified: Secondary | ICD-10-CM | POA: Insufficient documentation

## 2022-05-26 DIAGNOSIS — K208 Other esophagitis without bleeding: Secondary | ICD-10-CM | POA: Insufficient documentation

## 2022-05-26 DIAGNOSIS — K449 Diaphragmatic hernia without obstruction or gangrene: Secondary | ICD-10-CM | POA: Insufficient documentation

## 2022-05-26 DIAGNOSIS — Z79899 Other long term (current) drug therapy: Secondary | ICD-10-CM | POA: Diagnosis not present

## 2022-05-26 DIAGNOSIS — E785 Hyperlipidemia, unspecified: Secondary | ICD-10-CM | POA: Diagnosis not present

## 2022-05-26 DIAGNOSIS — I4891 Unspecified atrial fibrillation: Secondary | ICD-10-CM | POA: Insufficient documentation

## 2022-05-26 DIAGNOSIS — N281 Cyst of kidney, acquired: Secondary | ICD-10-CM | POA: Insufficient documentation

## 2022-05-26 DIAGNOSIS — I700 Atherosclerosis of aorta: Secondary | ICD-10-CM | POA: Insufficient documentation

## 2022-05-26 DIAGNOSIS — N400 Enlarged prostate without lower urinary tract symptoms: Secondary | ICD-10-CM | POA: Insufficient documentation

## 2022-05-26 DIAGNOSIS — Z87891 Personal history of nicotine dependence: Secondary | ICD-10-CM | POA: Insufficient documentation

## 2022-05-26 DIAGNOSIS — I10 Essential (primary) hypertension: Secondary | ICD-10-CM | POA: Insufficient documentation

## 2022-05-26 DIAGNOSIS — Z7189 Other specified counseling: Secondary | ICD-10-CM

## 2022-05-26 DIAGNOSIS — I614 Nontraumatic intracerebral hemorrhage in cerebellum: Secondary | ICD-10-CM | POA: Diagnosis not present

## 2022-05-26 DIAGNOSIS — G9389 Other specified disorders of brain: Secondary | ICD-10-CM | POA: Diagnosis not present

## 2022-05-26 MED ORDER — GADOPICLENOL 0.5 MMOL/ML IV SOLN
7.0000 mL | Freq: Once | INTRAVENOUS | Status: AC | PRN
  Administered 2022-05-26: 7 mL via INTRAVENOUS

## 2022-05-26 NOTE — Progress Notes (Signed)
Met with patient and his family during follow up visit with Dr. Janese Banks and initial consult with Dr. Baruch Gouty to discuss recent pathology results and treatment options. All questions answered during visit. Reviewed upcoming appt for brain MRI today at 5pm with pt and family. After consultation with Dr. Baruch Gouty, pt and family were in agreement to go ahead and schedule pt for concurrent chemo/radiation. Pt scheduled for CT simulation, chemo class, and PAC placement. Informed that will be scheduled for first chemo treatment once radiation start date is known. No further questions at this time. Instructed pt and his family to call with any questions or needs. Pt and his family verbalized understanding.

## 2022-05-26 NOTE — Progress Notes (Signed)
Follow up phone call made to pt and spoke with his wife to inform of appt for port placement. At this time, pt and family have decided to pursue second opinion at [facility] or [facility]. Offered to help with the referral process but family declined stating that they have the phone number and have already spoken with [facility] regarding second opinion. Informed that will cancel appts for CT sim, chemo class, and PAC placement but encouraged to keep appt for brain MRI that is scheduled later today. Family verbalized understanding and stated will call me back to reschedule his appts if needed.

## 2022-05-26 NOTE — Consult Note (Signed)
NEW PATIENT EVALUATION  Name: Casey Gallegos  MRN: [id number]  Date:   05/26/2022     DOB: Jan 21, 1935   This 87 y.o. 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   DIAGNOSIS: The encounter diagnosis was Mass of upper lobe of left lung.   PREVIOUS INVESTIGATIONS:  CT scans and PET scan reviewed Labs reviewed Clinical notes reviewed  HPI: Patient is an 87 year old male who presented with proximate 40 pound weight loss over the past several months some dysphagia.  Work-up including CT scan showed a 5.5 x 3.7 x 2.8 cm mass in the left upper lobe.  There was confluent left hilar lymph nodes as well as a large AP window lymph node measuring 1.6 cm.  Did have bilateral renal cysts the right kidney was concerning for cystic possible renal neoplasm.  He underwent biopsy which was positive for non-small cell lung cancer consistent with squamous cell carcinoma.  His swallowing has improved somewhat he has seen speech pathology as well as having a swallowing study showing no evidence of fistula formation.  He specifically [place] cough hemoptysis.  He has been seen by medical oncology with recommendation for concurrent chemoradiation therapy he seen today for radiation oncology evaluation.  PLANNED TREATMENT REGIMEN: Concurrent chemoradiation  PAST MEDICAL HISTORY:  has a past medical history of A-fib ([place]), Actinic keratosis, Anemia, Aortic atherosclerosis ([place]), Bilateral renal cysts, Bladder calculi, BPH (benign prostatic hyperplasia), DDD (degenerative disc disease), lumbar, Diverticulosis, DOE (dyspnea on exertion), DVT (deep venous thrombosis) ([place]), Elevated PSA, GERD (gastroesophageal reflux disease), H/O degenerative disc disease, HLD (hyperlipidemia), Hypertension, Lipoma of arm, Lipomatosis,  Long term current use of anticoagulant, Mass of upper lobe of left lung (04/24/2022), NICM (nonischemic cardiomyopathy) ([place]) (11/22/2015), Night sweats, Obesity, Osteoarthritis, RBBB (right bundle branch block), Right inguinal hernia, and VHD (valvular heart disease) (11/22/2015).    PAST SURGICAL HISTORY:  Past Surgical History:  Procedure Laterality Date   Bilateral Radical Keratotomy Bilateral 1997   COLONOSCOPY WITH PROPOFOL N/A 01/11/2016   Procedure: COLONOSCOPY WITH PROPOFOL;  Surgeon: Manya Silvas, MD;  Location: [facility] ENDOSCOPY;  Service: Endoscopy;  Laterality: N/A; repeat 3 years, tubular adenoma   DENTAL SURGERY     Patient had implants   FLEXIBLE BRONCHOSCOPY Left 05/19/2022   Procedure: FLEXIBLE BRONCHOSCOPY;  Surgeon: Tyler Pita, MD;  Location: [facility] ORS;  Service: Cardiopulmonary;  Laterality: Left;   GUM SURGERY     L4 - S1 decompression Left    PROSTATE BIOPSY     TONSILLECTOMY AND ADENOIDECTOMY  age 84   VIDEO BRONCHOSCOPY WITH ENDOBRONCHIAL ULTRASOUND Left 05/19/2022   Procedure: VIDEO BRONCHOSCOPY WITH ENDOBRONCHIAL ULTRASOUND;  Surgeon: Tyler Pita, MD;  Location: [facility] ORS;  Service: Cardiopulmonary;  Laterality: Left;   XI ROBOTIC ASSISTED INGUINAL HERNIA REPAIR WITH MESH Right 10/21/2021   Procedure: XI ROBOTIC ASSISTED INGUINAL HERNIA REPAIR WITH MESH;  Surgeon: Ronny Bacon, MD;  Location: [facility] ORS;  Service: General;  Laterality: Right;    FAMILY HISTORY: family history includes CAD in his father; Cancer in his mother; Diabetes in his son; Heart attack in his father; Hypertension in his sister.  SOCIAL HISTORY:  reports that he quit smoking about 50 years ago. His smoking use included cigarettes. He has never been exposed to tobacco smoke. He has never  used smokeless tobacco. He reports that he does not currently use alcohol. He reports that he does not use drugs.  ALLERGIES: Latex, Cefdinir, Doxycycline, Prednisone, and  Sertraline  MEDICATIONS:  Current Outpatient Medications  Medication Sig Dispense Refill   amLODipine (NORVASC) 5 MG tablet TAKE 1 TABLET EVERY DAY 90 tablet 3   benazepril (LOTENSIN) 40 MG tablet TAKE 1 TABLET EVERY DAY 90 tablet 3   carvedilol (COREG) 6.25 MG tablet TAKE 1 TABLET TWICE DAILY 180 tablet 3   Cyanocobalamin (VITAMIN B 12 PO) Take by mouth.     doxazosin (CARDURA) 8 MG tablet TAKE 1 TABLET EVERY DAY 90 tablet 3   finasteride (PROSCAR) 5 MG tablet Take 1 tablet (5 mg total) by mouth daily. 90 tablet 3   gabapentin (NEURONTIN) 300 MG capsule TAKE 1 CAPSULE EVERY MORNING, 1 CAPSULE IN THE AFTERNOON AND 2 CAPSULES AT BEDTIME 360 capsule 3   Magnesium 500 MG TABS Take 500 mg by mouth daily.     Multiple Vitamin (MULTI-VITAMIN) tablet Take 1 tablet by mouth daily.      oxyCODONE (OXY IR/ROXICODONE) 5 MG immediate release tablet Take by mouth. (Patient not taking: Reported on 05/26/2022)     triamterene-hydrochlorothiazide (MAXZIDE-25) 37.5-25 MG tablet TAKE 1/2 TABLET EVERY DAY 45 tablet 0   warfarin (COUMADIN) 4 MG tablet TAKE 1 TABLET EVERY DAY IN THE AFTERNOON (Patient taking differently: 5 mg,  Mon, Weds, Fri, 4 mg the rest of the week) 90 tablet 0   No current facility-administered medications for this encounter.    ECOG PERFORMANCE STATUS:  1 - Symptomatic but completely ambulatory  REVIEW OF SYSTEMS: Patient denies any weight loss, fatigue, weakness, fever, chills or night sweats. Patient denies any loss of vision, blurred vision. Patient denies any ringing  of the ears or hearing loss. No irregular heartbeat. Patient denies heart murmur or history of fainting. Patient denies any chest pain or pain radiating to her upper extremities. Patient denies any shortness of breath, difficulty breathing at night, cough or hemoptysis. Patient denies any swelling in the lower legs. Patient denies any nausea vomiting, vomiting of blood, or coffee ground material in the vomitus. Patient  denies any stomach pain. Patient states has had normal bowel movements no significant constipation or diarrhea. Patient denies any dysuria, hematuria or significant nocturia. Patient denies any problems walking, swelling in the joints or loss of balance. Patient denies any skin changes, loss of hair or loss of weight. Patient denies any excessive worrying or anxiety or significant depression. Patient denies any problems with insomnia. Patient denies excessive thirst, polyuria, polydipsia. Patient denies any swollen glands, patient denies easy bruising or easy bleeding. Patient denies any recent infections, allergies or URI. Patient "s visual fields have not changed significantly in recent time.   PHYSICAL EXAM: There were no vitals taken for this visit. Well-developed well-nourished patient in NAD. HEENT reveals PERLA, EOMI, discs not visualized.  Oral cavity is clear. No oral mucosal lesions are identified. Neck is clear without evidence of cervical or supraclavicular adenopathy. Lungs are clear to A&P. Cardiac examination is essentially unremarkable with regular rate and rhythm without murmur rub or thrill. Abdomen is benign with no organomegaly or masses noted. Motor sensory and DTR levels are equal and symmetric in the upper and lower extremities. Cranial nerves II through XII are grossly intact. Proprioception is intact. No peripheral adenopathy or edema is identified. No motor or sensory levels are noted. Crude visual fields are within normal range.  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  We will plan delivering 70 Gray over 7 weeks using IMRT treatment planning and delivery based on the 3B nature of his disease.   Would use PET scan for treatment planning fusion study.  Risks and benefits of treatment including worsening of cough may be worsening of his dysphagia secondary radiation esophagitis fatigue alteration of blood counts skin reaction all reviewed in detail with the patient and his family.  There will be extra effort by both professional staff as well as technical staff to coordinate and manage concurrent chemoradiation and ensuing side effects during his treatments. We will coordinate his chemotherapy with medical oncology.  Patient and family both comprehend my treatment plan well.  Simulation was arranged for later this week.  We will review his MRI of his brain when it is available.  I would like to take this opportunity to thank you for allowing me to participate in the care of your patient.Noreene Filbert, MD

## 2022-05-26 NOTE — Progress Notes (Addendum)
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  He was found to have a left upper lobe lung mass measuring 5.5 x 3.7 x 2.8 cm.  It was confluent with the left hilar lymph node.  Enlarged AP window lymph node measuring 16 mm.  Enlarged left paratracheal lymph node.  Low-attenuation 7 mm lesion in the liver.  7 mm cyst in the uncinate process of the pancreas for which follow-up was not recommended.  Bilateral kidney cysts.  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  Prostatic enlargement.  PET CT is can showed hypermetabolic left upper lobe lung mass measuring 5.4 x 3.9 cm with an SUV of 15.1.  Ipsilateral mediastinal lymph node metastases with index lymph node measuring 1.7 with an SUV of 9.8.  No evidence of distant metastatic disease.  Biopsy of the left upper lobe as well as station 4R lymph node was consistent with squamous cell carcinoma  Interval history-patient is here with his daughter today.  He reports   Fatigue but some denies any exertional shortness of breath.  ECOG PS- 1 Pain  scale- 0   Review of systems- Review of Systems  Constitutional:  Positive for malaise/fatigue. Negative for chills, fever and weight loss.  HENT:  Negative for congestion, ear discharge and nosebleeds.   Eyes:  Negative for blurred vision.  Respiratory:  Negative for cough, hemoptysis, sputum production, shortness of breath and wheezing.   Cardiovascular:  Negative for chest pain, palpitations, orthopnea and claudication.  Gastrointestinal:  Negative for abdominal pain, blood in stool, constipation, diarrhea, heartburn, melena, nausea and vomiting.  Genitourinary:  Negative for dysuria, flank pain, frequency, hematuria and urgency.  Musculoskeletal:  Negative for back pain, joint pain and myalgias.  Skin:  Negative for rash.  Neurological:  Negative for dizziness, tingling, focal weakness, seizures, weakness and headaches.  Endo/Heme/Allergies:  Does not bruise/bleed easily.  Psychiatric/Behavioral:  Negative for depression and suicidal ideas. The patient does not have insomnia.       Allergies  Allergen Reactions   Latex Swelling   Cefdinir Nausea Only    hypersensitivity to smell   Doxycycline     Sun sensitivity   Prednisone     Hallucinations    Sertraline Other (See Comments)    Hallucinations      Past Medical History:  Diagnosis Date   A-fib ([place])    a.) CHA2DS2-VASc = 6 (age x2, HTN, DVT x2, vascular disease history). b.) rate/rhythm maintained on oral carvedilol; chronically anticoagulated using daily warfarin.   Actinic keratosis    Anemia    Aortic atherosclerosis (HCC)    Bilateral renal cysts    a.)  CT abdomen pelvis 04/24/2022: Enhancing septation of the lateral aspect of the  mid RIGHT kidney measuring 2.7 x 2.8 cm concerning for cystic renal neoplasm   Bladder calculi    BPH (benign prostatic hyperplasia)    DDD (degenerative disc disease), lumbar    a.) s/p LEFT laminectomy L5   Diverticulosis    DOE (dyspnea on exertion)    DVT (deep venous  thrombosis) (HCC)    Elevated PSA    GERD (gastroesophageal reflux disease)    H/O degenerative disc disease    HLD (hyperlipidemia)    Hypertension    Lipoma of arm    Lipomatosis    Long term current use of anticoagulant    a.) warfarin   Mass of upper lobe of left lung 04/24/2022   a.)  CT chest 04/24/2022: Multilobulated perihilar mass measuring 5.3 x 3.7 x 2.8 cm with associated LEFT hilar and mediastinal LAD; b.)  PET CT 46/50/3546: Hypermetabolic LEFT upper lobe lung mass (max SUV 15.1); ipsilateral nodal metastasis --> presuming a NSC histology, 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   NICM (nonischemic cardiomyopathy) ([place]) 11/22/2015   a.) TTE 11/22/2015: EF 40%. b.) TTE 12/27/2018: EF 45%. c.) TTE 08/30/2020: EF 45-50%.   Night sweats    Obesity    Osteoarthritis    RBBB (right bundle branch block)    Right inguinal hernia    VHD (valvular heart disease) 11/22/2015   a.) TTE 11/22/2015: EF 40%; mod MR, mod-sev TR, triv PR; mod BAE, mild RV enlargement. b.) TTE 12/27/2018: EF 45%; mod MR, mod-sev TR, triv PR; mod BAE, mild RV enlargement. c.) TTE 08/30/2020: EF 45-50%; mild MR/TR.     Past Surgical History:  Procedure Laterality Date   Bilateral Radical Keratotomy Bilateral 1997   COLONOSCOPY WITH PROPOFOL N/A 01/11/2016   Procedure: COLONOSCOPY WITH PROPOFOL;  Surgeon: Manya Silvas, MD;  Location: [facility] ENDOSCOPY;  Service: Endoscopy;  Laterality: N/A; repeat 3 years, tubular adenoma   DENTAL SURGERY     Patient had implants   FLEXIBLE BRONCHOSCOPY Left 05/19/2022   Procedure: FLEXIBLE BRONCHOSCOPY;  Surgeon: Tyler Pita, MD;  Location: [facility] ORS;  Service: Cardiopulmonary;  Laterality: Left;   GUM SURGERY     L4 - S1 decompression Left    PROSTATE BIOPSY     TONSILLECTOMY AND ADENOIDECTOMY  age 31   VIDEO BRONCHOSCOPY WITH ENDOBRONCHIAL ULTRASOUND Left 05/19/2022   Procedure: VIDEO BRONCHOSCOPY WITH ENDOBRONCHIAL  ULTRASOUND;  Surgeon: Tyler Pita, MD;  Location: [facility] ORS;  Service: Cardiopulmonary;  Laterality: Left;   XI ROBOTIC ASSISTED INGUINAL HERNIA REPAIR WITH MESH Right 10/21/2021   Procedure: XI ROBOTIC ASSISTED INGUINAL HERNIA REPAIR WITH MESH;  Surgeon: Ronny Bacon, MD;  Location: [facility] ORS;  Service: General;  Laterality: Right;    Social History   Socioeconomic History   Marital status: Married    Spouse name: Doris   Number of children: 3   Years of education: Not on file   Highest education level: Associate degree: occupational, Hotel manager, or vocational program  Occupational History   Occupation: retired  Tobacco Use   Smoking status: Former    Types: Cigarettes    Quit date: 08/26/1971    Years since quitting: 50.7    Passive exposure: Never   Smokeless tobacco: Never   Tobacco comments:    Smoked for about 20 years.  Vaping Use   Vaping Use: Never used  Substance and Sexual Activity   Alcohol use: Not Currently   Drug use: No   Sexual activity:  Not on file  Other Topics Concern   Not on file  Social History Narrative   Not on file   Social Determinants of Health   Financial Resource Strain: Low Risk  (12/18/2021)   Overall Financial Resource Strain (CARDIA)    Difficulty of Paying Living Expenses: Not hard at all  Food Insecurity: No Food Insecurity (12/18/2021)   Hunger Vital Sign    Worried About Running Out of Food in the Last Year: Never true    Ran Out of Food in the Last Year: Never true  Transportation Needs: No Transportation Needs (12/18/2021)   PRAPARE - Hydrologist (Medical): No    Lack of Transportation (Non-Medical): No  Physical Activity: Sufficiently Active (12/18/2021)   Exercise Vital Sign    Days of Exercise per Week: 7 days    Minutes of Exercise per Session: 50 min  Stress: No Stress Concern Present (12/18/2021)   [place]    Feeling of  Stress : Only a little  Social Connections: Moderately Integrated (12/18/2021)   Social Connection and Isolation Panel [NHANES]    Frequency of Communication with Friends and Family: Twice a week    Frequency of Social Gatherings with Friends and Family: Once a week    Attends Religious Services: More than 4 times per year    Active Member of [employer] or Organizations: No    Attends Archivist Meetings: Never    Marital Status: Married  Human resources officer Violence: Not At Risk (12/18/2021)   Humiliation, Afraid, Rape, and Kick questionnaire    Fear of Current or Ex-Partner: No    Emotionally Abused: No    Physically Abused: No    Sexually Abused: No    Family History  Problem Relation Age of Onset   Cancer Mother        secondary to cancinoma of the jaw from her dipping snuff.   Heart attack Father    CAD Father    Hypertension Sister    Diabetes Son        Type I diabetes   Prostate cancer Neg Hx    Kidney cancer Neg Hx      Current Outpatient Medications:    amLODipine (NORVASC) 5 MG tablet, TAKE 1 TABLET EVERY DAY, Disp: 90 tablet, Rfl: 3   benazepril (LOTENSIN) 40 MG tablet, TAKE 1 TABLET EVERY DAY, Disp: 90 tablet, Rfl: 3   carvedilol (COREG) 6.25 MG tablet, TAKE 1 TABLET TWICE DAILY, Disp: 180 tablet, Rfl: 3   Cyanocobalamin (VITAMIN B 12 PO), Take by mouth., Disp: , Rfl:    doxazosin (CARDURA) 8 MG tablet, TAKE 1 TABLET EVERY DAY, Disp: 90 tablet, Rfl: 3   finasteride (PROSCAR) 5 MG tablet, Take 1 tablet (5 mg total) by mouth daily., Disp: 90 tablet, Rfl: 3   gabapentin (NEURONTIN) 300 MG capsule, TAKE 1 CAPSULE EVERY MORNING, 1 CAPSULE IN THE AFTERNOON AND 2 CAPSULES AT BEDTIME, Disp: 360 capsule, Rfl: 3   Magnesium 500 MG TABS, Take 500 mg by mouth daily., Disp: , Rfl:    Multiple Vitamin (MULTI-VITAMIN) tablet, Take 1 tablet by mouth daily. , Disp: , Rfl:    triamterene-hydrochlorothiazide (MAXZIDE-25) 37.5-25 MG tablet, TAKE 1/2 TABLET EVERY DAY, Disp: 45  tablet, Rfl: 0   warfarin (COUMADIN) 4 MG tablet, TAKE 1 TABLET EVERY DAY IN THE AFTERNOON (Patient taking differently: 5 mg,  Mon, Weds, Fri, 4 mg the rest of the week),  Disp: 90 tablet, Rfl: 0   oxyCODONE (OXY IR/ROXICODONE) 5 MG immediate release tablet, Take by mouth. (Patient not taking: Reported on 05/26/2022), Disp: , Rfl:   Physical exam:  Vitals:   05/26/22 0925  BP: (!) 150/88  Pulse: 73  Resp: 16  Temp: (!) 97.2 F (36.2 C)  SpO2: 100%  Weight: 157 lb 9.6 oz (71.5 kg)   Physical Exam Constitutional:      General: He is not in acute distress. Cardiovascular:     Rate and Rhythm: Normal rate and regular rhythm.  Pulmonary:     Effort: Pulmonary effort is normal.  Skin:    General: Skin is warm and dry.  Neurological:     Mental Status: He is alert and oriented to person, place, and time.         Latest Ref Rng & Units 04/22/2022    3:43 PM  CMP  Glucose 70 - 99 mg/dL 100   BUN 8 - 27 mg/dL 18   Creatinine 0.76 - 1.27 mg/dL 1.21   Sodium 134 - 144 mmol/L 141   Potassium 3.5 - 5.2 mmol/L 4.0   Chloride 96 - 106 mmol/L 105   CO2 20 - 29 mmol/L 23   Calcium 8.6 - 10.2 mg/dL 9.3   Total Protein 6.0 - 8.5 g/dL 6.1   Total Bilirubin 0.0 - 1.2 mg/dL 1.1   Alkaline Phos 44 - 121 IU/L 79   AST 0 - 40 IU/L 16   ALT 0 - 44 IU/L 8       Latest Ref Rng & Units 04/22/2022    3:43 PM  CBC  WBC 3.4 - 10.8 x10E3/uL 7.6   Hemoglobin 13.0 - 17.7 g/dL 12.8   Hematocrit 37.5 - 51.0 % 37.3   Platelets 150 - 450 x10E3/uL 212     No images are attached to the encounter.  Leanne Chang OP MEDICARE SPEECH PATH  Result Date: 05/22/2022 CLINICAL DATA:  Dysphagia. Cough EXAM: MODIFIED BARIUM SWALLOW TECHNIQUE: Different consistencies of barium were administered orally to the patient by the Speech Pathologist. Imaging of the pharynx was performed in the lateral projection. The radiologist was present in the fluoroscopy room for this study, providing personal supervision.  FLUOROSCOPY: Radiation Exposure Index (as provided by the fluoroscopic device): 13 mGy COMPARISON:  None Available. FINDINGS: Vestibular  Penetration:  Laryngeal penetrations. Aspiration: Tracheal aspiration with thin liquids without a cough reflex. Other:  None. IMPRESSION: Modified barium swallow as described above. Please refer to the Speech Pathologists report for complete details and recommendations. Electronically Signed   By: Kathreen Devoid M.D.   On: 05/22/2022 15:41   DG Chest Port 1 View  Result Date: 05/19/2022 CLINICAL DATA:  Left upper lobe hypermetabolic lung mass with ipsilateral nodal involvement, status post bronchoscopy EXAM: PORTABLE CHEST 1 VIEW COMPARISON:  05/15/2022 chest CT FINDINGS: Left perihilar density compatible with the known mass. Bilateral skin folds project over the upper chest, there are visible lung markings peripheral to the resulting edges compatible with skin folds rather than pneumothorax. Mildly low lung volumes noted. No pleural effusion noted. IMPRESSION: 1. No pneumothorax or complicating feature identified. Bilateral skin folds over the upper chest. 2. Left perihilar density compatible with known mass. Electronically Signed   By: Van Clines M.D.   On: 05/19/2022 15:22   CT SUPER D CHEST WO CONTRAST  Result Date: 05/16/2022 CLINICAL DATA:  Hypermetabolic left upper lobe lung mass and thoracic adenopathy consistent with lung cancer, procedural planning  and guidance purposes * Tracking Code: BO * EXAM: CT CHEST WITHOUT CONTRAST TECHNIQUE: Multidetector CT imaging of the chest was performed using thin slice collimation for electromagnetic bronchoscopy planning purposes, without intravenous contrast. RADIATION DOSE REDUCTION: This exam was performed according to the departmental dose-optimization program which includes automated exposure control, adjustment of the mA and/or kV according to patient size and/or use of iterative reconstruction technique. COMPARISON:   05/07/2022 PET-CT and CT scan from 04/24/2022 FINDINGS: Cardiovascular: Coronary, aortic arch, and branch vessel atherosclerotic vascular disease. Mediastinum/Nodes: Previously hypermetabolic AP window lymph node 2.2 cm in short axis on image 26 series 2. Adjacent AP window lymph node 1.7 cm in short axis on image 25 series 2. Lungs/Pleura: Previously hypermetabolic left upper lobe mass measures about 5.0 by 3.2 cm on image 74 series 3. Centrilobular emphysema. There is a band of density which is likely primarily atelectatic extending anteriorly in the left upper lobe, although this does have slightly nodular components it was not hypermetabolic on PET-CT of 12/45/8099. Trace left pleural effusion medially. Upper Abdomen: Abdominal aortic atherosclerosis. Common origin of the celiac trunk and SMA with associated substantial atheromatous vascular calcifications. 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 Musculoskeletal: Degenerative glenohumeral arthropathy, left greater than right. Midthoracic spondylosis. IMPRESSION: 1. Similar appearance of the left upper lobe mass and previously hypermetabolic AP window adenopathy. 2. 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 Today's exam is noncontrast and does not depict the enhancement shown on 04/24/2022. 3. Other imaging findings of potential clinical significance: Extensive atherosclerosis. Aortic Atherosclerosis (ICD10-I70.0). Coronary atherosclerosis. Emphysema (ICD10-J43.9). Degenerative glenohumeral arthropathy, left greater than right. Trace left pleural effusion. Electronically Signed   By: Van Clines M.D.   On: 05/16/2022 12:00   NM PET Image Initial (PI) Skull Base To Thigh  Result Date: 05/09/2022 CLINICAL DATA:  Initial treatment strategy for left upper lobe lung mass and thoracic adenopathy on recent CT performed for weight loss.  EXAM: NUCLEAR MEDICINE PET SKULL BASE TO THIGH TECHNIQUE: 7.9 mCi F-18 FDG was injected intravenously. Full-ring PET imaging was performed from the skull base to thigh after the radiotracer. CT data was obtained and used for attenuation correction and anatomic localization. Fasting blood glucose: 98 mg/dl COMPARISON:  04/24/2022 chest abdomen and pelvic CTs FINDINGS: Mediastinal blood pool activity: SUV max 2.0 Liver activity: SUV max NA NECK: No areas of abnormal hypermetabolism. Incidental CT findings: No cervical adenopathy. Bilateral carotid atherosclerotic versus. CHEST: The left upper lobe lung mass is hypermetabolic. Example 5.4 x 3.9 cm and a S.U.V. max of 15.1 on 79/4. Ipsilateral mediastinal nodal metastasis, with an index AP window node measuring 1.7 cm and a S.U.V. max of 9.8 on 74/4. Incidental CT findings: Deferred to recent diagnostic CT. Mild cardiomegaly. Aortic and coronary artery calcification. Trace bilateral pleural fluid or thickening. Centrilobular emphysema. ABDOMEN/PELVIS: No abdominopelvic parenchymal or nodal hypermetabolism. Incidental CT findings: Deferred to recent diagnostic CT. Dense contrast throughout the colon, presumably from recent esophagram. This degrades evaluation. Right renal lesions were detailed on prior diagnostic CT. Moderate prostatomegaly. Right-greater-than-left bladder stones. SKELETON: No abnormal marrow activity. Incidental CT findings: None. IMPRESSION: 1. Left upper lobe primary bronchogenic carcinoma with ipsilateral nodal metastasis. Presuming non-small-cell histology, T3N2M0 or stage IIIB. 2. Incidental findings, including: Aortic atherosclerosis (ICD10-I70.0), coronary artery atherosclerosis and emphysema (ICD10-J43.9). Prostatomegaly with bladder calculi. Electronically Signed   By: Adria Devon.D.  On: 05/09/2022 09:39   DG ESOPHAGUS W DOUBLE CM (HD)  Result Date: 05/05/2022 CLINICAL DATA:  87 year old male with history of dysphagia with solids and  pills. Request is for double esophagram EXAM: ESOPHAGUS/BARIUM SWALLOW/TABLET STUDY TECHNIQUE: Combined double and single contrast examination was performed using effervescent crystals, high-density barium, and thin liquid barium. This exam was performed by Rushie Nyhan NP, and was supervised and interpreted by Dr. Kathreen Devoid. FLUOROSCOPY: Radiation Exposure Index (as provided by the fluoroscopic device): 10.4 mGy Kerma COMPARISON:  None Available. FINDINGS: Swallowing: Laryngeal penetration with significant tracheal aspiration with spontaneous cough reflex. Pharynx: Dysphagia with vallecular pooling . Esophagus: Normal appearance. Esophageal motility: Normal motility. Hiatal Hernia: None. Gastroesophageal reflux: None visualized. Ingested 74m barium tablet: Passed normally Other: None. IMPRESSION: Multiple episodes of tracheal aspiration with spontaneous cough reflex. Recommend further evaluation with speech pathology consultation. Electronically Signed   By: HKathreen DevoidM.D.   On: 05/05/2022 10:57     Assessment and plan- Patient is a 87y.o. 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  There is evidence of hypermetabolic uptake in the left upper lobe as well as mediastinal lymph node which was consistent with stage III disease.  Patient had a bronchoscopy and squamous cell carcinoma was confirmed in both the left upper lobe and station 4R superior mediastinal lymph node.  This would constitute stage IIIb disease.  With mediastinal lymph node involvement he would not be a candidate for surgery.  I recommend concurrent chemoradiation at this time with carboplatin AUC 2 along with Taxol at 45 mg per metered square.  Radiation would likely be for 7 weeks and he is meeting with radiation oncology today.  Patient  will receive chemotherapy once a week for [redacted] weeks along with radiation.  Discussed risks and benefits of chemotherapy including all but not limited to nausea, vomiting, low blood counts, risk ofInfections and hospitalizations.  Risk of infusion reaction and peripheral neuropathy associated with carboplatin and Taxol.  Treatment will be given with a curative intent.  Upon completion of concurrent chemoradiation I will obtain repeat CT chest abdomen and pelvis with contrast.  If CT scan show partial response or stable disease he would go on to receive immunotherapy with durvalumab which can be given once a month for a year.  Patient will also need a baseline MRI brain with and without contrast to rule out metastatic disease to the brain.  In a randomized phase III trial, over 700 patients with unresectable, stage III NSCLC without progression after at least two cycles of platinum-based chemoradiotherapy were randomly assigned to up to 12 months of the PD-L1 antibody durvalumab every two weeks or placebo in a 2:1 ratio. At a median follow-up of 25 months, durvalumab increased the median PFS (17.2 versus 5.6 months; HR for disease progression or death 0.51, 95% CI 0.41-0.63) and OS (HR for death 0.68, 95% CI 0.54-0.86) . Subsequent reporting revealed five-year survival rates of 43 versus 33 percent, and median OS of 48 versus 29 months, with and without durvalumab, respectively.     Treatment will be given with a curative intent.  Patient would like to meet with radiation oncology first and then discuss with family before deciding if he wants to proceed with concurrent chemoradiation or not.  If he agrees for concurrent  chemoradiation I would recommend port placement for treatment as well as chemo teach.  Chemotherapy will tentatively begin 2 weeks from now when he starts radiation treatment  We are sending of NGS testing for biomarkers but that would not change his management for stage III lung cancer in the  absence of surgery.  Patient was also noted to have a complicated right renal cyst measuring 2.7 x 2.8 cm on his CT scan.  This does require a further follow-up with urology and a MRI abdomen with and without contrast for better characterization of the cyst.  Typically PET scan is not the best test to look at renal cell neoplasms.  In any case I do not feel that the right renal cyst is related to his lung cancer.  It would be reasonable to wait for his chemoradiation for lung cancer to be done and then get an MRI abdomen and referral to urology.  Patient will think about it and let us know which way he wants to proceed about that as well   Cancer Staging  Primary malignant neoplasm of left upper lobe of lung [facility]) Staging form: Lung, AJCC 8th Edition - Clinical stage from 05/26/2022: Stage IIIB (cT3, cN2, cM0) - Signed by Sindy Guadeloupe, MD on 05/26/2022      Visit Diagnosis 1. Primary malignant neoplasm of left upper lobe of lung ([place])   2. Renal cyst   3. Goals of care, counseling/discussion      Dr. Randa Evens, MD, MPH [facility] [phone] 05/26/2022 12:59 PM

## 2022-05-27 DIAGNOSIS — Z87891 Personal history of nicotine dependence: Secondary | ICD-10-CM | POA: Diagnosis not present

## 2022-05-27 DIAGNOSIS — C3412 Malignant neoplasm of upper lobe, left bronchus or lung: Secondary | ICD-10-CM | POA: Diagnosis not present

## 2022-05-28 ENCOUNTER — Inpatient Hospital Stay (HOSPITAL_BASED_OUTPATIENT_CLINIC_OR_DEPARTMENT_OTHER): Payer: Medicare HMO | Admitting: Hospice and Palliative Medicine

## 2022-05-28 ENCOUNTER — Ambulatory Visit: Payer: Medicare HMO

## 2022-05-28 DIAGNOSIS — C3412 Malignant neoplasm of upper lobe, left bronchus or lung: Secondary | ICD-10-CM

## 2022-05-28 NOTE — Progress Notes (Signed)
Multidisciplinary Oncology Council Documentation  Casey Gallegos was presented by our [facility] on 05/28/2022, which included representatives from:  Palliative Care Dietitian  Physical/Occupational Therapist Nurse Navigator Genetics Speech Therapist Social work Survivorship RN Financial Navigator Research RN   Casey Gallegos currently presents with history of lung cancer  We reviewed previous medical and familial history, history of present illness, and recent lab results along with all available histopathologic and imaging studies. The Casey Gallegos considered available treatment options and made the following recommendations/referrals:  Nutrition, rehab screening  The MOC is a meeting of clinicians from various specialty areas who evaluate and discuss patients for whom a multidisciplinary approach is being considered. Final determinations in the plan of care are those of the provider(s).   Today's extended care, comprehensive team conference, Casey Gallegos was not present for the discussion and was not examined.

## 2022-05-29 ENCOUNTER — Telehealth: Payer: Self-pay | Admitting: Family Medicine

## 2022-05-29 ENCOUNTER — Other Ambulatory Visit: Payer: Medicare HMO

## 2022-05-29 NOTE — Telephone Encounter (Signed)
Pt had to stop Warfarin for a procedure that was done 05/19/22.  He started back on Warfarin on 05/22/22.  He wants to know when he should come in and have his PT/INR checked. Please leave a message on his VM letting him know if they do not answer.

## 2022-05-29 NOTE — Telephone Encounter (Signed)
Patient advised per last ov note. Hold for 5 days due to upcomming procedure scheduled for 05/19/2022.  Recheck coumadin level 1 week after resuming coumadin therapy.

## 2022-05-29 NOTE — Progress Notes (Signed)
Tumor Board Documentation  KANOA PHILLIPPI was presented by Dr Janese Banks at our Tumor Board on 05/29/2022, which included representatives from medical oncology, pathology, radiology, surgical, pharmacy, pulmonology, radiation oncology, navigation, research, internal medicine, palliative care.  Abou currently presents as a new patient, for new positive pathology, for [place] with history of the following treatments: surgical intervention(s).  Additionally, we reviewed previous medical and familial history, history of present illness, and recent lab results along with all available histopathologic and imaging studies. The tumor board considered available treatment options and made the following recommendations: Concurrent chemo-radiation therapy, Immunotherapy Patient gteting second opinion at [facility]  The following procedures/referrals were also placed: No orders of the defined types were placed in this encounter.   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  Tumor board is a meeting of clinicians from various specialty areas who evaluate and discuss patients for whom a multidisciplinary approach is being considered. Final determinations in the plan of care are those of the provider(s). The responsibility for follow up of recommendations given during tumor board is that of the provider.   Today's extended care, comprehensive team conference, Regino was not present for the discussion and was not examined.   Multidisciplinary Tumor Board is a multidisciplinary case peer review process.  Decisions discussed in the Multidisciplinary Tumor Board reflect the opinions of the specialists present at the conference without having examined the patient.  Ultimately, treatment and diagnostic decisions rest with the primary provider(s)  and the patient.

## 2022-05-30 ENCOUNTER — Ambulatory Visit (INDEPENDENT_AMBULATORY_CARE_PROVIDER_SITE_OTHER): Payer: Medicare HMO | Admitting: Physician Assistant

## 2022-05-30 ENCOUNTER — Ambulatory Visit: Payer: Medicare HMO | Admitting: Radiology

## 2022-05-30 DIAGNOSIS — Z23 Encounter for immunization: Secondary | ICD-10-CM | POA: Diagnosis not present

## 2022-05-30 DIAGNOSIS — I480 Paroxysmal atrial fibrillation: Secondary | ICD-10-CM | POA: Diagnosis not present

## 2022-05-30 LAB — POCT INR
INR: 1.5 — AB (ref 2.0–3.0)
PT: 17.6

## 2022-06-02 ENCOUNTER — Ambulatory Visit: Payer: Medicare HMO | Admitting: Radiology

## 2022-06-02 ENCOUNTER — Other Ambulatory Visit: Payer: Medicare HMO

## 2022-06-04 ENCOUNTER — Other Ambulatory Visit: Payer: Self-pay | Admitting: Oncology

## 2022-06-04 ENCOUNTER — Encounter: Payer: Self-pay | Admitting: *Deleted

## 2022-06-04 DIAGNOSIS — C3492 Malignant neoplasm of unspecified part of left bronchus or lung: Secondary | ICD-10-CM | POA: Diagnosis not present

## 2022-06-04 DIAGNOSIS — C3412 Malignant neoplasm of upper lobe, left bronchus or lung: Secondary | ICD-10-CM

## 2022-06-04 DIAGNOSIS — J439 Emphysema, unspecified: Secondary | ICD-10-CM | POA: Diagnosis not present

## 2022-06-04 MED ORDER — PROCHLORPERAZINE MALEATE 10 MG PO TABS: 10.0000 mg | ORAL_TABLET | Freq: Four times a day (QID) | ORAL | 1 refills | Status: DC | PRN

## 2022-06-04 MED ORDER — ONDANSETRON HCL 8 MG PO TABS: 8.0000 mg | ORAL_TABLET | Freq: Three times a day (TID) | ORAL | 1 refills | Status: DC | PRN

## 2022-06-04 MED ORDER — LIDOCAINE-PRILOCAINE 2.5-2.5 % EX CREA: TOPICAL_CREAM | CUTANEOUS | 3 refills | Status: DC

## 2022-06-04 MED ORDER — DEXAMETHASONE 4 MG PO TABS: ORAL_TABLET | ORAL | 1 refills | Status: DC

## 2022-06-04 NOTE — Progress Notes (Signed)
Received call from pt's wife, Carla Drape, to inform that pt would like to proceed with concurrent chemo/radiation at [facility]. States had second opinion visit at [facility] and would like to pursue treatments locally. Appts for CT simulation, chemo class, and port placement have been rescheduled. Reviewed all upcoming appts with pt's wife. Informed that pt will be scheduled for chemo once radiation treatments have been scheduled. All questions answered during call. Instructed to call with any further questions or needs. Dottie verbalized understanding.

## 2022-06-04 NOTE — Progress Notes (Signed)
START ON PATHWAY REGIMEN - Non-Small Cell Lung     A cycle is every 7 days, concurrent with RT:     Paclitaxel      Carboplatin   **Always confirm dose/schedule in your pharmacy ordering system**  Patient Characteristics: Preoperative or Nonsurgical Candidate (Clinical Staging), Stage III - Nonsurgical Candidate (Nonsquamous and Squamous), PS = 0, 1 Therapeutic Status: Preoperative or Nonsurgical Candidate (Clinical Staging) AJCC T Category: cT3 AJCC N Category: cN2 AJCC M Category: cM0 AJCC 8 Stage Grouping: IIIB ECOG Performance Status: 1 Intent of Therapy: Curative Intent, Discussed with Patient

## 2022-06-05 ENCOUNTER — Other Ambulatory Visit: Payer: Self-pay

## 2022-06-05 DIAGNOSIS — C3492 Malignant neoplasm of unspecified part of left bronchus or lung: Secondary | ICD-10-CM | POA: Insufficient documentation

## 2022-06-09 ENCOUNTER — Inpatient Hospital Stay: Payer: Medicare HMO

## 2022-06-09 ENCOUNTER — Ambulatory Visit
Admission: RE | Admit: 2022-06-09 | Discharge: 2022-06-09 | Disposition: A | Discharge status: Home / Self Care | Payer: Medicare HMO | Source: Ambulatory Visit | Attending: Radiation Oncology | Admitting: Radiation Oncology

## 2022-06-09 ENCOUNTER — Other Ambulatory Visit: Payer: Self-pay | Admitting: Oncology

## 2022-06-09 ENCOUNTER — Encounter: Payer: Self-pay | Admitting: Oncology

## 2022-06-09 ENCOUNTER — Encounter: Payer: Self-pay | Admitting: *Deleted

## 2022-06-09 DIAGNOSIS — C3412 Malignant neoplasm of upper lobe, left bronchus or lung: Secondary | ICD-10-CM | POA: Diagnosis not present

## 2022-06-09 DIAGNOSIS — C771 Secondary and unspecified malignant neoplasm of intrathoracic lymph nodes: Secondary | ICD-10-CM | POA: Insufficient documentation

## 2022-06-09 DIAGNOSIS — Z5111 Encounter for antineoplastic chemotherapy: Secondary | ICD-10-CM | POA: Diagnosis not present

## 2022-06-09 DIAGNOSIS — Z510 Encounter for antineoplastic radiation therapy: Secondary | ICD-10-CM | POA: Insufficient documentation

## 2022-06-09 DIAGNOSIS — Z87891 Personal history of nicotine dependence: Secondary | ICD-10-CM | POA: Diagnosis not present

## 2022-06-10 ENCOUNTER — Encounter: Payer: Self-pay | Admitting: Oncology

## 2022-06-10 NOTE — Progress Notes (Signed)
Spoke with patient's wife, Carla Drape, by phone to review upcoming appts to start chemotherapy when radiation is scheduled to start. All questions answered during call. Instructed to call back with any further questions or needs. Dottie verbalized understanding.

## 2022-06-11 ENCOUNTER — Ambulatory Visit (INDEPENDENT_AMBULATORY_CARE_PROVIDER_SITE_OTHER): Payer: Medicare HMO | Admitting: Family Medicine

## 2022-06-11 ENCOUNTER — Ambulatory Visit: Payer: Medicare HMO

## 2022-06-11 ENCOUNTER — Ambulatory Visit: Payer: Medicare HMO | Admitting: Urology

## 2022-06-11 ENCOUNTER — Encounter: Payer: Self-pay | Admitting: Urology

## 2022-06-11 VITALS — BP 150/82 | HR 82 | Ht 69.0 in | Wt 155.0 lb

## 2022-06-11 DIAGNOSIS — R339 Retention of urine, unspecified: Secondary | ICD-10-CM

## 2022-06-11 DIAGNOSIS — N401 Enlarged prostate with lower urinary tract symptoms: Secondary | ICD-10-CM

## 2022-06-11 DIAGNOSIS — N138 Other obstructive and reflux uropathy: Secondary | ICD-10-CM | POA: Diagnosis not present

## 2022-06-11 DIAGNOSIS — I480 Paroxysmal atrial fibrillation: Secondary | ICD-10-CM | POA: Diagnosis not present

## 2022-06-11 DIAGNOSIS — N281 Cyst of kidney, acquired: Secondary | ICD-10-CM

## 2022-06-11 LAB — POCT INR
INR: 2.2 (ref 2.0–3.0)
POC INR: 2.2
PT: 26

## 2022-06-11 NOTE — Progress Notes (Unsigned)
06/11/2022 1:19 PM   Casey Gallegos Jul 24, 1935 [id number]  Referring provider: [name], MD [address] [place],  [place] [address]  Chief Complaint  Patient presents with   Other    HPI: 87 y.o. male previously followed for BPH with history urinary retention and incomplete bladder emptying referred for evaluation of a complex right renal cyst  He saw Dr. Erlene Quan March 2023 for TRUS for volume and to discuss HoLEP.  His prostate volume was 89 cc.  His voiding symptoms had improved and his voiding pattern significantly improved and he decided against HoLEP Chest x-ray was performed August 2023 for weight loss which showed a perihilar mass and he was subsequently diagnosed with squamous cell carcinoma of the left upper lobe. Treatment plan is chemotherapy and radiation As a note of his evaluation CT of the chest abdomen and pelvis was performed which showed a 2.7 x 2.8 cm complicated right renal cyst with enhancing septa States he continues to do well from a voiding standpoint   PMH: Past Medical History:  Diagnosis Date   A-fib [facility])    a.) CHA2DS2-VASc = 6 (age x2, HTN, DVT x2, vascular disease history). b.) rate/rhythm maintained on oral carvedilol; chronically anticoagulated using daily warfarin.   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   Bladder calculi    BPH (benign prostatic hyperplasia)    DDD (degenerative disc disease), lumbar    a.) s/p LEFT laminectomy L5   Diverticulosis    DOE (dyspnea on exertion)    DVT (deep venous thrombosis) (HCC)    Elevated PSA    GERD (gastroesophageal reflux disease)    H/O degenerative disc disease    HLD (hyperlipidemia)    Hypertension    Lipoma of arm    Lipomatosis    Long term current  use of anticoagulant    a.) warfarin   Mass of upper lobe of left lung 04/24/2022   a.)  CT chest 04/24/2022: Multilobulated perihilar mass measuring 5.3 x 3.7 x 2.8 cm with associated LEFT hilar and mediastinal LAD; b.)  PET CT 26/71/2458: Hypermetabolic LEFT upper lobe lung mass (max SUV 15.1); ipsilateral nodal metastasis --> presuming a NSC histology, 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   NICM (nonischemic cardiomyopathy) ([facility]) 11/22/2015   a.) TTE 11/22/2015: EF 40%. b.) TTE 12/27/2018: EF 45%. c.) TTE 08/30/2020: EF 45-50%.   Night sweats    Obesity    Osteoarthritis    RBBB (right bundle branch block)    Right inguinal hernia    VHD (valvular heart disease) 11/22/2015   a.) TTE 11/22/2015: EF 40%; mod MR, mod-sev TR, triv PR; mod BAE, mild RV enlargement. b.) TTE 12/27/2018: EF 45%; mod MR, mod-sev TR, triv PR; mod BAE, mild RV enlargement. c.) TTE 08/30/2020: EF 45-50%; mild MR/TR.    Surgical History: Past Surgical History:  Procedure Laterality Date   Bilateral Radical Keratotomy Bilateral 1997   COLONOSCOPY WITH PROPOFOL N/A 01/11/2016   Procedure: COLONOSCOPY WITH PROPOFOL;  Surgeon: Manya Silvas, MD;  Location: [facility] ENDOSCOPY;  Service: Endoscopy;  Laterality: N/A; repeat 3 years, tubular adenoma   DENTAL SURGERY     Patient had implants   FLEXIBLE BRONCHOSCOPY Left  05/19/2022   Procedure: FLEXIBLE BRONCHOSCOPY;  Surgeon: Tyler Pita, MD;  Location: [facility] ORS;  Service: Cardiopulmonary;  Laterality: Left;   GUM SURGERY     L4 - S1 decompression Left    PROSTATE BIOPSY     TONSILLECTOMY AND ADENOIDECTOMY  age 46   VIDEO BRONCHOSCOPY WITH ENDOBRONCHIAL ULTRASOUND Left 05/19/2022   Procedure: VIDEO BRONCHOSCOPY WITH ENDOBRONCHIAL ULTRASOUND;  Surgeon: Tyler Pita, MD;  Location: [facility] ORS;  Service: Cardiopulmonary;  Laterality: Left;   XI ROBOTIC ASSISTED INGUINAL HERNIA REPAIR WITH MESH Right 10/21/2021   Procedure:  XI ROBOTIC ASSISTED INGUINAL HERNIA REPAIR WITH MESH;  Surgeon: Ronny Bacon, MD;  Location: [facility] ORS;  Service: General;  Laterality: Right;    Home Medications:  Allergies as of 06/11/2022       Reactions   Latex Swelling   Cefdinir Nausea Only   hypersensitivity to smell   Doxycycline    Sun sensitivity   Prednisone    Hallucinations    Sertraline Other (See Comments)   Hallucinations         Medication List        Accurate as of June 11, 2022  1:19 PM. If you have any questions, ask your nurse or doctor.          STOP taking these medications    oxyCODONE 5 MG immediate release tablet Commonly known as: Oxy IR/ROXICODONE Stopped by: Abbie Sons, MD       TAKE these medications    amLODipine 5 MG tablet Commonly known as: NORVASC TAKE 1 TABLET EVERY DAY   benazepril 40 MG tablet Commonly known as: LOTENSIN TAKE 1 TABLET EVERY DAY   carvedilol 6.25 MG tablet Commonly known as: COREG TAKE 1 TABLET TWICE DAILY   dexamethasone 4 MG tablet Commonly known as: DECADRON Take 2 tablets daily for 2 days, start the day after chemotherapy. Take with food.   doxazosin 8 MG tablet Commonly known as: CARDURA TAKE 1 TABLET EVERY DAY   finasteride 5 MG tablet Commonly known as: PROSCAR Take 1 tablet (5 mg total) by mouth daily.   gabapentin 300 MG capsule Commonly known as: NEURONTIN TAKE 1 CAPSULE EVERY MORNING, 1 CAPSULE IN THE AFTERNOON AND 2 CAPSULES AT BEDTIME   lidocaine-prilocaine cream Commonly known as: EMLA Apply to affected area once   Magnesium 500 MG Tabs Take 500 mg by mouth daily.   Multi-Vitamin tablet Take 1 tablet by mouth daily.   ondansetron 8 MG tablet Commonly known as: Zofran Take 1 tablet (8 mg total) by mouth every 8 (eight) hours as needed for nausea or vomiting. Start on the third day after chemotherapy.   prochlorperazine 10 MG tablet Commonly known as: COMPAZINE Take 1 tablet (10 mg total) by mouth every 6  (six) hours as needed for nausea or vomiting.   triamterene-hydrochlorothiazide 37.5-25 MG tablet Commonly known as: MAXZIDE-25 TAKE 1/2 TABLET EVERY DAY   VITAMIN B 12 PO Take by mouth.   warfarin 4 MG tablet Commonly known as: COUMADIN Take as directed by the anticoagulation clinic. If you are unsure how to take this medication, talk to your nurse or doctor. Original instructions: TAKE 1 TABLET EVERY DAY IN THE AFTERNOON What changed: See the new instructions.        Allergies:  Allergies  Allergen Reactions   Latex Swelling   Cefdinir Nausea Only    hypersensitivity to smell   Doxycycline     Sun sensitivity   Prednisone  Hallucinations    Sertraline Other (See Comments)    Hallucinations     Family History: Family History  Problem Relation Age of Onset   Cancer Mother        secondary to cancinoma of the jaw from her dipping snuff.   Heart attack Father    CAD Father    Hypertension Sister    Diabetes Son        Type I diabetes   Prostate cancer Neg Hx    Kidney cancer Neg Hx     Social History:  reports that he quit smoking about 50 years ago. His smoking use included cigarettes. He has never been exposed to tobacco smoke. He has never used smokeless tobacco. He reports that he does not currently use alcohol. He reports that he does not use drugs.   Physical Exam: BP (!) 150/82   Pulse 82   Ht _0  (1.753 m)   Wt 155 lb (70.3 kg)   BMI 22.89 kg/m   Constitutional:  Alert and oriented, No acute distress. HEENT: [place] AT Respiratory: Normal respiratory effort, no increased work of breathing. Psychiatric: Normal mood and affect.   Pertinent Imaging: CT images were personally reviewed and interpreted.  Estimated bladder volume was calculated at 288 mL   Assessment & Plan:    1.  Complex right renal cyst  Bosniak 3/possible Bosniak 4 renal cyst We discussed this may represent a cystic renal neoplasm however active surveillance is an option for  masses < 4 cm.  The incidence of metastasis without growth in these lesions is <5% Based on his recent diagnosis of lung cancer I would recommend surveillance and a follow-up renal mass protocol CT or MRI in 3 months.  We will see if this can be coordinated with follow-up imaging for his lung cancer.  2.  BPH with LUTS Stable and clinically improved   Abbie Sons, MD  [facility] [address], [place], [place] [address] [phone]

## 2022-06-11 NOTE — Patient Instructions (Signed)
Description   Continue 4 mg daily except 5 mg on M-W-F F/U 2 weeks

## 2022-06-12 ENCOUNTER — Other Ambulatory Visit: Payer: Self-pay | Admitting: Student

## 2022-06-12 DIAGNOSIS — C3412 Malignant neoplasm of upper lobe, left bronchus or lung: Secondary | ICD-10-CM

## 2022-06-12 NOTE — Progress Notes (Signed)
Patient for IR Port Placement on Fri 06/13/22, I called and spoke with patient on phone with pre-procedure instructions given. Pt is aware to be here at 12:30p, NPO after MN prior to procedure as well as driver post procedure/recovery/discharge. Stated understanding. Called on 06/12/22

## 2022-06-13 ENCOUNTER — Encounter: Payer: Self-pay | Admitting: Radiology

## 2022-06-13 ENCOUNTER — Ambulatory Visit
Admission: RE | Admit: 2022-06-13 | Discharge: 2022-06-13 | Disposition: A | Discharge status: Home / Self Care | Payer: Medicare HMO | Source: Ambulatory Visit | Attending: Oncology | Admitting: Oncology

## 2022-06-13 DIAGNOSIS — I4891 Unspecified atrial fibrillation: Secondary | ICD-10-CM | POA: Insufficient documentation

## 2022-06-13 DIAGNOSIS — D649 Anemia, unspecified: Secondary | ICD-10-CM | POA: Diagnosis not present

## 2022-06-13 DIAGNOSIS — Z87891 Personal history of nicotine dependence: Secondary | ICD-10-CM | POA: Diagnosis not present

## 2022-06-13 DIAGNOSIS — Z452 Encounter for adjustment and management of vascular access device: Secondary | ICD-10-CM | POA: Diagnosis not present

## 2022-06-13 DIAGNOSIS — C3412 Malignant neoplasm of upper lobe, left bronchus or lung: Secondary | ICD-10-CM

## 2022-06-13 DIAGNOSIS — I428 Other cardiomyopathies: Secondary | ICD-10-CM | POA: Diagnosis not present

## 2022-06-13 DIAGNOSIS — I4510 Unspecified right bundle-branch block: Secondary | ICD-10-CM | POA: Insufficient documentation

## 2022-06-13 HISTORY — PX: IR IMAGING GUIDED PORT INSERTION: IMG5740

## 2022-06-13 MED ORDER — FENTANYL CITRATE (PF) 100 MCG/2ML IJ SOLN
INTRAMUSCULAR | Status: AC | PRN
  Administered 2022-06-13: 50 ug via INTRAVENOUS

## 2022-06-13 MED ORDER — FENTANYL CITRATE (PF) 100 MCG/2ML IJ SOLN
INTRAMUSCULAR | Status: AC
  Filled 2022-06-13: qty 2

## 2022-06-13 MED ORDER — MIDAZOLAM HCL 2 MG/2ML IJ SOLN
INTRAMUSCULAR | Status: AC | PRN
  Administered 2022-06-13: 1 mg via INTRAVENOUS

## 2022-06-13 MED ORDER — SODIUM CHLORIDE 0.9 % IV SOLN
INTRAVENOUS | Status: DC
  Administered 2022-06-13: 1000 mL via INTRAVENOUS

## 2022-06-13 MED ORDER — MIDAZOLAM HCL 2 MG/2ML IJ SOLN
INTRAMUSCULAR | Status: AC
  Filled 2022-06-13: qty 4

## 2022-06-13 MED ORDER — LIDOCAINE-EPINEPHRINE 1 %-1:100000 IJ SOLN
INTRAMUSCULAR | Status: AC
  Administered 2022-06-13: 12 mL
  Filled 2022-06-13: qty 1

## 2022-06-13 MED ORDER — HEPARIN SOD (PORK) LOCK FLUSH 100 UNIT/ML IV SOLN
INTRAVENOUS | Status: AC
  Administered 2022-06-13: 500 [IU]
  Filled 2022-06-13: qty 5

## 2022-06-13 NOTE — Discharge Instructions (Signed)
Implanted Port Home Guide  An implanted port is a type of central line that is placed under the skin. Central lines are used to provide IV access when treatment or nutrition needs to be given through a person's veins. Implanted ports are used for long-term IV access. An implanted port may be placed because: You need IV medicine that would be irritating to the small veins in your hands or arms. You need long-term IV medicines, such as antibiotics. You need IV nutrition for a long period. You need frequent blood draws for lab tests. You need dialysis.   Implanted ports are usually placed in the chest area, but they can also be placed in the upper arm, the abdomen, or the leg. An implanted port has two main parts: Reservoir. The reservoir is round and will appear as a small, raised area under your skin. The reservoir is the part where a needle is inserted to give medicines or draw blood. Catheter. The catheter is a thin, flexible tube that extends from the reservoir. The catheter is placed into a large vein. Medicine that is inserted into the reservoir goes into the catheter and then into the vein.   How will I care for my incision  You may shower tomorrow  How is my port accessed? Special steps must be taken to access the port: Before the port is accessed, a numbing cream can be placed on the skin. This helps numb the skin over the port site. Your health care provider uses a sterile technique to access the port. Your health care provider must put on a mask and sterile gloves. The skin over your port is cleaned carefully with an antiseptic and allowed to dry. The port is gently pinched between sterile gloves, and a needle is inserted into the port. Only "non-coring" port needles should be used to access the port. Once the port is accessed, a blood return should be checked. This helps ensure that the port is in the vein and is not clogged. If your port needs to remain accessed for a constant  infusion, a clear (transparent) bandage will be placed over the needle site. The bandage and needle will need to be changed every week, or as directed by your health care provider.   What is flushing? Flushing helps keep the port from getting clogged. Follow your health care provider's instructions on how and when to flush the port. Ports are usually flushed with saline solution or a medicine called heparin. The need for flushing will depend on how the port is used. If the port is used for intermittent medicines or blood draws, the port will need to be flushed: After medicines have been given. After blood has been drawn. As part of routine maintenance. If a constant infusion is running, the port may not need to be flushed.   How long will my port stay implanted? The port can stay in for as long as your health care provider thinks it is needed. When it is time for the port to come out, surgery will be done to remove it. The procedure is similar to the one performed when the port was put in. When should I seek immediate medical care? When you have an implanted port, you should seek immediate medical care if: You notice a bad smell coming from the incision site. You have swelling, redness, or drainage at the incision site. You have more swelling or pain at the port site or the surrounding area. You have a fever that   is not controlled with medicine.   This information is not intended to replace advice given to you by your health care provider. Make sure you discuss any questions you have with your health care provider. Document Released: 08/11/2005 Document Revised: 01/17/2016 Document Reviewed: 04/18/2013 Elsevier Interactive Patient Education  2017 [employer]    

## 2022-06-13 NOTE — H&P (Signed)
Chief Complaint: Patient was seen in consultation today for image-guided port placement  Referring Physician(s): [place] C  Supervising Physician: Jacqulynn Cadet  Patient Status: [facility] - Out-pt  History of Present Illness: Casey Gallegos is a 87 y.o. male with complex PMH including atrial fibrillation, anemia, non-ischemic cardiomyopathy, valvular heart disease, and right bundle branch block being seen today for image-guided port placement due to squamous cell carcinoma of the lung. The patient underwent a CT Chest/Abdomen/Pelvis w contrast on 8/31 which revealed a left upper lobe lung mass measuring 5.5 x 3.7 x 2.8 cm. Follow-up PET scan showed a hypermetabolic left upper lobe mass. Biopsy of the mass revealed squamous cell carcinoma of the lung. The patient has been referred to IR for placement of a port to facilitate chemotherapy.  Past Medical History:  Diagnosis Date   A-fib [facility])    a.) CHA2DS2-VASc = 6 (age x2, HTN, DVT x2, vascular disease history). b.) rate/rhythm maintained on oral carvedilol; chronically anticoagulated using daily warfarin.   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   Bladder calculi    BPH (benign prostatic hyperplasia)    DDD (degenerative disc disease), lumbar    a.) s/p LEFT laminectomy L5   Diverticulosis    DOE (dyspnea on exertion)    DVT (deep venous thrombosis) (HCC)    Elevated PSA    GERD (gastroesophageal reflux disease)    H/O degenerative disc disease    HLD (hyperlipidemia)    Hypertension    Lipoma of arm    Lipomatosis    Long term current use of anticoagulant    a.) warfarin   Mass of upper lobe of left lung 04/24/2022   a.)  CT chest 04/24/2022: Multilobulated perihilar mass measuring 5.3 x 3.7 x 2.8 cm with  associated LEFT hilar and mediastinal LAD; b.)  PET CT 10/05/1733: Hypermetabolic LEFT upper lobe lung mass (max SUV 15.1); ipsilateral nodal metastasis --> presuming a NSC histology, 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   NICM (nonischemic cardiomyopathy) ([place]) 11/22/2015   a.) TTE 11/22/2015: EF 40%. b.) TTE 12/27/2018: EF 45%. c.) TTE 08/30/2020: EF 45-50%.   Night sweats    Obesity    Osteoarthritis    RBBB (right bundle branch block)    Right inguinal hernia    VHD (valvular heart disease) 11/22/2015   a.) TTE 11/22/2015: EF 40%; mod MR, mod-sev TR, triv PR; mod BAE, mild RV enlargement. b.) TTE 12/27/2018: EF 45%; mod MR, mod-sev TR, triv PR; mod BAE, mild RV enlargement. c.) TTE 08/30/2020: EF 45-50%; mild MR/TR.    Past Surgical History:  Procedure Laterality Date   Bilateral Radical Keratotomy Bilateral 1997   COLONOSCOPY WITH PROPOFOL N/A 01/11/2016   Procedure: COLONOSCOPY WITH PROPOFOL;  Surgeon: Manya Silvas, MD;  Location: [facility] ENDOSCOPY;  Service: Endoscopy;  Laterality: N/A; repeat 3 years, tubular adenoma   DENTAL SURGERY     Patient had implants   FLEXIBLE BRONCHOSCOPY Left 05/19/2022   Procedure: FLEXIBLE BRONCHOSCOPY;  Surgeon: Tyler Pita, MD;  Location: [facility] ORS;  Service: Cardiopulmonary;  Laterality: Left;   GUM SURGERY     L4 - S1 decompression Left    PROSTATE BIOPSY     TONSILLECTOMY  AND ADENOIDECTOMY  age 64   VIDEO BRONCHOSCOPY WITH ENDOBRONCHIAL ULTRASOUND Left 05/19/2022   Procedure: VIDEO BRONCHOSCOPY WITH ENDOBRONCHIAL ULTRASOUND;  Surgeon: Tyler Pita, MD;  Location: [facility] ORS;  Service: Cardiopulmonary;  Laterality: Left;   XI ROBOTIC ASSISTED INGUINAL HERNIA REPAIR WITH MESH Right 10/21/2021   Procedure: XI ROBOTIC ASSISTED INGUINAL HERNIA REPAIR WITH MESH;  Surgeon: Ronny Bacon, MD;  Location: [facility] ORS;  Service: General;  Laterality: Right;    Allergies: Latex, Cefdinir, Doxycycline,  Prednisone, and Sertraline  Medications: Prior to Admission medications   Medication Sig Start Date End Date Taking? Authorizing Provider  amLODipine (NORVASC) 5 MG tablet TAKE 1 TABLET EVERY DAY 07/05/21   Jerrol Banana., MD  benazepril (LOTENSIN) 40 MG tablet TAKE 1 TABLET EVERY DAY 12/18/21   Jerrol Banana., MD  carvedilol (COREG) 6.25 MG tablet TAKE 1 TABLET TWICE DAILY 07/05/21   Jerrol Banana., MD  Cyanocobalamin (VITAMIN B 12 PO) Take by mouth.    [provider]  dexamethasone (DECADRON) 4 MG tablet Take 2 tablets daily for 2 days, start the day after chemotherapy. Take with food. 06/04/22   Sindy Guadeloupe, MD  doxazosin (CARDURA) 8 MG tablet TAKE 1 TABLET EVERY DAY 07/05/21   Jerrol Banana., MD  finasteride (PROSCAR) 5 MG tablet Take 1 tablet (5 mg total) by mouth daily. 04/26/22   McGowan, Larene Beach A, PA-C  gabapentin (NEURONTIN) 300 MG capsule TAKE 1 CAPSULE EVERY MORNING, 1 CAPSULE IN THE AFTERNOON AND 2 CAPSULES AT BEDTIME 04/22/22   Jerrol Banana., MD  lidocaine-prilocaine (EMLA) cream Apply to affected area once 06/04/22   Sindy Guadeloupe, MD  Magnesium 500 MG TABS Take 500 mg by mouth daily.    [provider]  Multiple Vitamin (MULTI-VITAMIN) tablet Take 1 tablet by mouth daily.     [provider]  ondansetron (ZOFRAN) 8 MG tablet Take 1 tablet (8 mg total) by mouth every 8 (eight) hours as needed for nausea or vomiting. Start on the third day after chemotherapy. 06/04/22   Sindy Guadeloupe, MD  prochlorperazine (COMPAZINE) 10 MG tablet Take 1 tablet (10 mg total) by mouth every 6 (six) hours as needed for nausea or vomiting. 06/04/22   Sindy Guadeloupe, MD  triamterene-hydrochlorothiazide (MAXZIDE-25) 37.5-25 MG tablet TAKE 1/2 TABLET EVERY DAY 05/14/22   Simmons-Robinson, Riki Sheer, MD  warfarin (COUMADIN) 4 MG tablet TAKE 1 TABLET EVERY DAY IN THE AFTERNOON Patient taking differently: 5 mg,  Mon, Weds, Fri, 4 mg the rest  of the week 03/25/22   Jerrol Banana., MD     Family History  Problem Relation Age of Onset   Cancer Mother        secondary to cancinoma of the jaw from her dipping snuff.   Heart attack Father    CAD Father    Hypertension Sister    Diabetes Son        Type I diabetes   Prostate cancer Neg Hx    Kidney cancer Neg Hx     Social History   Socioeconomic History   Marital status: Married    Spouse name: Doris   Number of children: 3   Years of education: Not on file   Highest education level: Associate degree: occupational, Hotel manager, or vocational program  Occupational History   Occupation: retired  Tobacco Use   Smoking status: Former    Types: Cigarettes    Quit date: 08/26/1971  Years since quitting: 50.8    Passive exposure: Never   Smokeless tobacco: Never   Tobacco comments:    Smoked for about 20 years.  Vaping Use   Vaping Use: Never used  Substance and Sexual Activity   Alcohol use: Not Currently   Drug use: No   Sexual activity: Not on file  Other Topics Concern   Not on file  Social History Narrative   Not on file   Social Determinants of Health   Financial Resource Strain: Low Risk  (12/18/2021)   Overall Financial Resource Strain (CARDIA)    Difficulty of Paying Living Expenses: Not hard at all  Food Insecurity: No Food Insecurity (12/18/2021)   Hunger Vital Sign    Worried About Running Out of Food in the Last Year: Never true    Ran Out of Food in the Last Year: Never true  Transportation Needs: No Transportation Needs (12/18/2021)   PRAPARE - Hydrologist (Medical): No    Lack of Transportation (Non-Medical): No  Physical Activity: Sufficiently Active (12/18/2021)   Exercise Vital Sign    Days of Exercise per Week: 7 days    Minutes of Exercise per Session: 50 min  Stress: No Stress Concern Present (12/18/2021)   [place]    Feeling of Stress  : Only a little  Social Connections: Moderately Integrated (12/18/2021)   Social Connection and Isolation Panel [NHANES]    Frequency of Communication with Friends and Family: Twice a week    Frequency of Social Gatherings with Friends and Family: Once a week    Attends Religious Services: More than 4 times per year    Active Member of [employer] or Organizations: No    Attends Archivist Meetings: Never    Marital Status: Married     Review of Systems: A 12 point ROS discussed and pertinent positives are indicated in the HPI above.  All other systems are negative.  Review of Systems  Constitutional:  Negative for chills and fever.  Respiratory:  Negative for chest tightness and shortness of breath.   Cardiovascular:  Positive for leg swelling. Negative for chest pain.       Patient states he normally has LLE edema  Gastrointestinal:  Negative for diarrhea, nausea and vomiting.  Neurological:  Negative for dizziness and headaches.  Psychiatric/Behavioral:  Negative for confusion.     Vital Signs: BP (!) 170/80   Pulse (!) 57   Temp (!) 97.3 F (36.3 C) (Oral)   Resp 13   Ht _0  (1.753 m)   Wt 155 lb (70.3 kg)   SpO2 97%   BMI 22.89 kg/m    Physical Exam Vitals reviewed.  Constitutional:      General: He is not in acute distress.    Appearance: He is normal weight. He is not ill-appearing.  HENT:     Mouth/Throat:     Mouth: Mucous membranes are moist.  Cardiovascular:     Rate and Rhythm: Normal rate and regular rhythm.     Pulses: Normal pulses.     Heart sounds: Normal heart sounds.  Pulmonary:     Effort: Pulmonary effort is normal.     Breath sounds: Normal breath sounds.  Abdominal:     General: Abdomen is flat. Bowel sounds are normal.     Palpations: Abdomen is soft.     Tenderness: There is no abdominal tenderness.  Musculoskeletal:  Right lower leg: No edema.     Left lower leg: Edema present.     Comments: 2+ pitting edema of LLE  Skin:     General: Skin is warm and dry.  Neurological:     Mental Status: He is alert and oriented to person, place, and time.  Psychiatric:        Mood and Affect: Mood normal.        Behavior: Behavior normal.        Thought Content: Thought content normal.        Judgment: Judgment normal.     Imaging: MR Brain W Wo Contrast  Result Date: 05/26/2022 CLINICAL DATA:  Lung nodule EXAM: MRI HEAD WITHOUT AND WITH CONTRAST TECHNIQUE: Multiplanar, multiecho pulse sequences of the brain and surrounding structures were obtained without and with intravenous contrast. CONTRAST:  7 cc vueway intravenous COMPARISON:  None Available. FINDINGS: Brain: No abnormal enhancement to suggest metastatic disease. Remote insult in the subcortical high left frontal lobe with chronic blood products, nonenhancing. Additional small focus of remote hemorrhage in the superior right cerebellum, also nonenhancing. Generalized cerebral volume loss. No acute or subacute infarct, widespread hemorrhagic injury, hydrocephalus, or collection. Vascular: Normal flow voids. Skull and upper cervical spine: Normal marrow signal. Sinuses/Orbits: Negative. IMPRESSION: Negative for metastatic disease. Electronically Signed   By: Jorje Guild M.D.   On: 05/26/2022 18:37   DG SWALLOW FUNC OP MEDICARE SPEECH PATH  Result Date: 05/22/2022 CLINICAL DATA:  Dysphagia. Cough EXAM: MODIFIED BARIUM SWALLOW TECHNIQUE: Different consistencies of barium were administered orally to the patient by the Speech Pathologist. Imaging of the pharynx was performed in the lateral projection. The radiologist was present in the fluoroscopy room for this study, providing personal supervision. FLUOROSCOPY: Radiation Exposure Index (as provided by the fluoroscopic device): 13 mGy COMPARISON:  None Available. FINDINGS: Vestibular  Penetration:  Laryngeal penetrations. Aspiration: Tracheal aspiration with thin liquids without a cough reflex. Other:  None. IMPRESSION:  Modified barium swallow as described above. Please refer to the Speech Pathologists report for complete details and recommendations. Electronically Signed   By: Kathreen Devoid M.D.   On: 05/22/2022 15:41   DG Chest Port 1 View  Result Date: 05/19/2022 CLINICAL DATA:  Left upper lobe hypermetabolic lung mass with ipsilateral nodal involvement, status post bronchoscopy EXAM: PORTABLE CHEST 1 VIEW COMPARISON:  05/15/2022 chest CT FINDINGS: Left perihilar density compatible with the known mass. Bilateral skin folds project over the upper chest, there are visible lung markings peripheral to the resulting edges compatible with skin folds rather than pneumothorax. Mildly low lung volumes noted. No pleural effusion noted. IMPRESSION: 1. No pneumothorax or complicating feature identified. Bilateral skin folds over the upper chest. 2. Left perihilar density compatible with known mass. Electronically Signed   By: Van Clines M.D.   On: 05/19/2022 15:22   CT SUPER D CHEST WO CONTRAST  Result Date: 05/16/2022 CLINICAL DATA:  Hypermetabolic left upper lobe lung mass and thoracic adenopathy consistent with lung cancer, procedural planning and guidance purposes * Tracking Code: BO * EXAM: CT CHEST WITHOUT CONTRAST TECHNIQUE: Multidetector CT imaging of the chest was performed using thin slice collimation for electromagnetic bronchoscopy planning purposes, without intravenous contrast. RADIATION DOSE REDUCTION: This exam was performed according to the departmental dose-optimization program which includes automated exposure control, adjustment of the mA and/or kV according to patient size and/or use of iterative reconstruction technique. COMPARISON:  05/07/2022 PET-CT and CT scan from 04/24/2022 FINDINGS: Cardiovascular: Coronary, aortic arch, and branch  vessel atherosclerotic vascular disease. Mediastinum/Nodes: Previously hypermetabolic AP window lymph node 2.2 cm in short axis on image 26 series 2. Adjacent AP window  lymph node 1.7 cm in short axis on image 25 series 2. Lungs/Pleura: Previously hypermetabolic left upper lobe mass measures about 5.0 by 3.2 cm on image 74 series 3. Centrilobular emphysema. There is a band of density which is likely primarily atelectatic extending anteriorly in the left upper lobe, although this does have slightly nodular components it was not hypermetabolic on PET-CT of 22/48/2500. Trace left pleural effusion medially. Upper Abdomen: Abdominal aortic atherosclerosis. Common origin of the celiac trunk and SMA with associated substantial atheromatous vascular calcifications. 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 Musculoskeletal: Degenerative glenohumeral arthropathy, left greater than right. Midthoracic spondylosis. IMPRESSION: 1. Similar appearance of the left upper lobe mass and previously hypermetabolic AP window adenopathy. 2. 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 Today's exam is noncontrast and does not depict the enhancement shown on 04/24/2022. 3. Other imaging findings of potential clinical significance: Extensive atherosclerosis. Aortic Atherosclerosis (ICD10-I70.0). Coronary atherosclerosis. Emphysema (ICD10-J43.9). Degenerative glenohumeral arthropathy, left greater than right. Trace left pleural effusion. Electronically Signed   By: Van Clines M.D.   On: 05/16/2022 12:00    Labs:  CBC: Recent Labs    08/28/21 0906 10/18/21 1154 04/22/22 1543  WBC 6.3 5.4 7.6  HGB 13.1 11.7* 12.8*  HCT 36.8* 35.8* 37.3*  PLT 144* 149* 212    COAGS: Recent Labs    05/19/22 1125 05/30/22 1036 06/11/22 0957  INR 1.2 1.5* 2.2  2.2    BMP: Recent Labs    08/28/21 0906 10/18/21 1154 04/22/22 1543  NA 145* 139 141  K 4.1 4.1 4.0  CL 107* 109 105  CO2 _0 GLUCOSE 104* 94 100*  BUN 17 24* 18  CALCIUM 9.1 8.5* 9.3  CREATININE 1.05 1.05  1.21  GFRNONAA  --  >60  --     LIVER FUNCTION TESTS: Recent Labs    08/28/21 0906 10/18/21 1154 04/22/22 1543  BILITOT 1.0 1.1 1.1  AST _1 ALT _2 ALKPHOS 78 57 79  PROT 5.7* 6.3* 6.1  ALBUMIN 3.9 3.6 3.8    TUMOR MARKERS: No results for input(s): "AFPTM", "CEA", "CA199", "CHROMGRNA" in the last 8760 hours.  Assessment and Plan:  Casey Gallegos is an 87 yo male with complex PMH being seen today for image-guided port placement. The patient was recently diagnosed with squamous cell carcinoma of the left lung. The patient is to undergo image-guided port placement with Dr Laurence Ferrari on 06/13/22.   Risks and benefits of image guided port-a-catheter placement was discussed with the patient including, but not limited to bleeding, infection, pneumothorax, or fibrin sheath development and need for additional procedures.  All of the patient's questions were answered, patient is agreeable to proceed. Consent signed and in chart.   Thank you for this interesting consult.  I greatly enjoyed meeting Casey Gallegos and look forward to participating in their care.  A copy of this report was sent to the requesting provider on this date.  Electronically Signed: Lura Em, PA-C 06/13/2022, 1:58 PM   I spent a total of  30 Minutes   in face to face in clinical consultation, greater than 50% of which was counseling/coordinating care for image-guided port placement.

## 2022-06-16 DIAGNOSIS — Z87891 Personal history of nicotine dependence: Secondary | ICD-10-CM | POA: Diagnosis not present

## 2022-06-16 DIAGNOSIS — C3412 Malignant neoplasm of upper lobe, left bronchus or lung: Secondary | ICD-10-CM | POA: Diagnosis not present

## 2022-06-17 DIAGNOSIS — Z5111 Encounter for antineoplastic chemotherapy: Secondary | ICD-10-CM | POA: Diagnosis not present

## 2022-06-17 DIAGNOSIS — Z87891 Personal history of nicotine dependence: Secondary | ICD-10-CM | POA: Diagnosis not present

## 2022-06-17 DIAGNOSIS — C771 Secondary and unspecified malignant neoplasm of intrathoracic lymph nodes: Secondary | ICD-10-CM | POA: Diagnosis not present

## 2022-06-17 DIAGNOSIS — Z510 Encounter for antineoplastic radiation therapy: Secondary | ICD-10-CM | POA: Diagnosis not present

## 2022-06-17 DIAGNOSIS — C3412 Malignant neoplasm of upper lobe, left bronchus or lung: Secondary | ICD-10-CM | POA: Diagnosis not present

## 2022-06-18 ENCOUNTER — Inpatient Hospital Stay: Payer: Medicare HMO | Admitting: Occupational Therapy

## 2022-06-18 ENCOUNTER — Ambulatory Visit: Admission: RE | Admit: 2022-06-18 | Payer: Medicare HMO | Source: Ambulatory Visit

## 2022-06-18 DIAGNOSIS — C3412 Malignant neoplasm of upper lobe, left bronchus or lung: Secondary | ICD-10-CM

## 2022-06-18 NOTE — Therapy (Signed)
[facility] at [facility], [place], [place], [address] Phone: [phone]   Fax:  [phone]  Occupational Therapy Screen  Patient Details  Name: Casey Gallegos MRN: [id number] Date of Birth: 24-Dec-1934 No data recorded  Encounter Date: 06/18/2022   OT End of Session - 06/18/22 1337     Visit Number 0             Past Medical History:  Diagnosis Date   A-fib [facility])    a.) CHA2DS2-VASc = 6 (age x2, HTN, DVT x2, vascular disease history). b.) rate/rhythm maintained on oral carvedilol; chronically anticoagulated using daily warfarin.   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   Bladder calculi    BPH (benign prostatic hyperplasia)    DDD (degenerative disc disease), lumbar    a.) s/p LEFT laminectomy L5   Diverticulosis    DOE (dyspnea on exertion)    DVT (deep venous thrombosis) (HCC)    Elevated PSA    GERD (gastroesophageal reflux disease)    H/O degenerative disc disease    HLD (hyperlipidemia)    Hypertension    Lipoma of arm    Lipomatosis    Long term current use of anticoagulant    a.) warfarin   Mass of upper lobe of left lung 04/24/2022   a.)  CT chest 04/24/2022: Multilobulated perihilar mass measuring 5.3 x 3.7 x 2.8 cm with associated LEFT hilar and mediastinal LAD; b.)  PET CT 11/94/1740: Hypermetabolic LEFT upper lobe lung mass (max SUV 15.1); ipsilateral nodal metastasis --> presuming a NSC histology, 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   NICM (nonischemic cardiomyopathy) ([place]) 11/22/2015   a.) TTE 11/22/2015: EF 40%. b.) TTE 12/27/2018: EF 45%. c.) TTE 08/30/2020: EF 45-50%.   Night sweats    Obesity    Osteoarthritis    RBBB (right bundle  branch block)    Right inguinal hernia    VHD (valvular heart disease) 11/22/2015   a.) TTE 11/22/2015: EF 40%; mod MR, mod-sev TR, triv PR; mod BAE, mild RV enlargement. b.) TTE 12/27/2018: EF 45%; mod MR, mod-sev TR, triv PR; mod BAE, mild RV enlargement. c.) TTE 08/30/2020: EF 45-50%; mild MR/TR.    Past Surgical History:  Procedure Laterality Date   Bilateral Radical Keratotomy Bilateral 1997   COLONOSCOPY WITH PROPOFOL N/A 01/11/2016   Procedure: COLONOSCOPY WITH PROPOFOL;  Surgeon: Manya Silvas, MD;  Location: [facility];  Service: Endoscopy;  Laterality: N/A; repeat 3 years, tubular adenoma   DENTAL SURGERY     Patient had implants   FLEXIBLE BRONCHOSCOPY Left 05/19/2022   Procedure: FLEXIBLE BRONCHOSCOPY;  Surgeon: Tyler Pita, MD;  Location: [facility] ORS;  Service: Cardiopulmonary;  Laterality: Left;   GUM SURGERY     IR IMAGING GUIDED PORT INSERTION  06/13/2022   L4 - S1 decompression Left    PROSTATE BIOPSY     TONSILLECTOMY AND ADENOIDECTOMY  age 87   VIDEO BRONCHOSCOPY WITH ENDOBRONCHIAL ULTRASOUND Left 05/19/2022   Procedure: VIDEO BRONCHOSCOPY WITH ENDOBRONCHIAL ULTRASOUND;  Surgeon: Tyler Pita, MD;  Location: [facility] ORS;  Service: Cardiopulmonary;  Laterality: Left;   XI ROBOTIC ASSISTED INGUINAL HERNIA REPAIR WITH MESH Right 10/21/2021  Procedure: XI ROBOTIC ASSISTED INGUINAL HERNIA REPAIR WITH MESH;  Surgeon: Ronny Bacon, MD;  Location: [facility] ORS;  Service: General;  Laterality: Right;    There were no vitals filed for this visit.   Subjective Assessment - 06/18/22 1336     Subjective  I am doing okay -I stay busy and on the go all day -around the house helping my wife and in yard- mowing lawn.    Currently in Pain? No/denies                 ASSESSMENT AND PLAN 06/04/22 by DR Ready, Sande Rives : [name] is a 87 y.o. 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   He is doing well overall. He is not  symptomatic from the lung cancer. He is tolerating oral intake and his weight is stable. During this visit, we discussed lung cancer diagnosis and management. We discussed that the goal of therapy is cure.   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 He is a candidate for curative therapy with radiation, chemotherapy and immunotherapy. Surgical resection is contraindicated because the lung mass is close to large vessels. We recommend radiation therapy and chemotherapy for 6 weeks, 2-4 weeks later, to be followed by immunotherapy (durvalumab) for 1 year. Biomarker testing will not change management, maintenance immunotherapy after chemoradiotherapy is recommended for patients with SCC and tobacco use disorder. He will need follow up chest imaging every 3-4 months to evaluate for treatment response.  2) Bilateral renal cysts: He will follow up with urology.  3) Oropharyngeal dysphagia: He is tolerating oral intake and his weight is stable. Continue follow up with GI.  4) The patient will receive therapy with primary oncology tea.    OT SCREEN -refer by [facility] meeting Patient arrive ambulating in clinic with no loss of balance.  Wife accompanies patient.  Patient report one-story house with 2 steps to get again.  No falls in the last 6 months. Walk-in shower. Patient report able to walk outside, do yard work and mowing the lawn as well as helping around the house. Patient starting radiation and chemo.  Did do BERG balance test with patient patient scoring 50/56 putting patient at low risk for falling. 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 Patient was unable to stand 1 leg at as well as tandem and difficulty with alternating placing foot on stool unsupported. Provided patient with some information to work on sit and stand out of a firm chair not using hands twice a day 10 reps as well as sidestepping at the kitchen counter and heel raises 10 reps. Patient can  follow-up with me as needed but no therapy needs at this time patient to report his daughter is a physical therapist but lives 2-1/2 hours away in the [place].                                       Visit Diagnosis: Primary malignant neoplasm of left upper lobe of lung [facility])    Problem List Patient Active Problem List   Diagnosis Date Noted   Primary malignant neoplasm of left upper lobe of lung ([place]) 05/26/2022   Renal cyst 05/26/2022   Goals of care, counseling/discussion 05/26/2022   Mass of left lung    Mediastinal adenopathy  Right inguinal hernia 10/16/2021   Inguinal bulge 10/07/2021   Muscle strain 09/10/2021   Cardiomyopathy ([place]) 12/30/2018   Chronic venous insufficiency 11/11/2017   Lymphedema 11/11/2017   Pain in limb 10/27/2017   Cardiomyopathy, dilated, nonischemic ([place]) 11/07/2015   A-fib ([place]) 03/02/2015   Deep vein thrombosis ([place]) 03/02/2015   Essential (primary) hypertension 03/02/2015   Combined fat and carbohydrate induced hyperlipemia 03/02/2015   Heart valve disease 03/02/2015   Allergic rhinitis 02/28/2015   Absolute anemia 02/28/2015   Benign fibroma of prostate 02/28/2015   Back pain, chronic 02/28/2015   Narrowing of intervertebral disc space 02/28/2015   Acute thromboembolism of deep veins of lower extremity ([place]) 02/28/2015   ED (erectile dysfunction) of organic origin 02/28/2015   Abnormal prostate specific antigen 02/28/2015   BP (high blood pressure) 02/28/2015   Lipomatosis 02/28/2015   Malaise and fatigue 02/28/2015   Generalized hyperhidrosis 02/28/2015   Arthritis, degenerative 02/28/2015   AF (paroxysmal atrial fibrillation) ([place]) 02/28/2015   Chronic prostatitis 02/28/2015   Spinal stenosis 02/28/2015   Atypical pneumonia 02/28/2015   Acute embolism and thrombosis of deep vein of lower extremity ([place]) 02/28/2015    Rosalyn Gess, OTR/L,CLT 06/18/2022, 1:37 PM  [facility]  at [facility] [address], [place], [place], [address] Phone: [phone]   Fax:  [phone]  Name: Casey Gallegos MRN: [id number] Date of Birth: 1935/02/20

## 2022-06-19 ENCOUNTER — Ambulatory Visit
Admission: RE | Admit: 2022-06-19 | Discharge: 2022-06-19 | Disposition: A | Discharge status: Home / Self Care | Payer: Medicare HMO | Source: Ambulatory Visit | Attending: Radiation Oncology | Admitting: Radiation Oncology

## 2022-06-19 ENCOUNTER — Other Ambulatory Visit: Payer: Self-pay

## 2022-06-19 DIAGNOSIS — Z5111 Encounter for antineoplastic chemotherapy: Secondary | ICD-10-CM | POA: Diagnosis not present

## 2022-06-19 DIAGNOSIS — Z510 Encounter for antineoplastic radiation therapy: Secondary | ICD-10-CM | POA: Diagnosis not present

## 2022-06-19 DIAGNOSIS — Z87891 Personal history of nicotine dependence: Secondary | ICD-10-CM | POA: Diagnosis not present

## 2022-06-19 DIAGNOSIS — C3412 Malignant neoplasm of upper lobe, left bronchus or lung: Secondary | ICD-10-CM | POA: Diagnosis not present

## 2022-06-19 DIAGNOSIS — C771 Secondary and unspecified malignant neoplasm of intrathoracic lymph nodes: Secondary | ICD-10-CM | POA: Diagnosis not present

## 2022-06-19 LAB — RAD ONC ARIA SESSION SUMMARY
Course Elapsed Days: 0
Plan Fractions Treated to Date: 1
Plan Prescribed Dose Per Fraction: 2 Gy
Plan Total Fractions Prescribed: 35
Plan Total Prescribed Dose: 70 Gy
Reference Point Dosage Given to Date: 2 Gy
Reference Point Session Dosage Given: 2 Gy
Session Number: 1

## 2022-06-19 MED FILL — Dexamethasone Sodium Phosphate Inj 100 MG/10ML: INTRAMUSCULAR | Qty: 1 | Status: AC

## 2022-06-20 ENCOUNTER — Ambulatory Visit: Payer: Medicare HMO | Admitting: Pulmonary Disease

## 2022-06-20 ENCOUNTER — Encounter: Payer: Self-pay | Admitting: *Deleted

## 2022-06-20 ENCOUNTER — Inpatient Hospital Stay (HOSPITAL_BASED_OUTPATIENT_CLINIC_OR_DEPARTMENT_OTHER): Payer: Medicare HMO | Admitting: Oncology

## 2022-06-20 ENCOUNTER — Inpatient Hospital Stay: Payer: Medicare HMO

## 2022-06-20 ENCOUNTER — Encounter: Payer: Self-pay | Admitting: Oncology

## 2022-06-20 ENCOUNTER — Ambulatory Visit
Admission: RE | Admit: 2022-06-20 | Discharge: 2022-06-20 | Disposition: A | Discharge status: Home / Self Care | Payer: Medicare HMO | Source: Ambulatory Visit | Attending: Radiation Oncology | Admitting: Radiation Oncology

## 2022-06-20 ENCOUNTER — Other Ambulatory Visit: Payer: Self-pay

## 2022-06-20 VITALS — BP 171/89 | HR 77 | Temp 96.2°F | Resp 17

## 2022-06-20 VITALS — BP 157/90 | HR 59 | Temp 97.7°F | Resp 16 | Ht 69.0 in | Wt 159.8 lb

## 2022-06-20 DIAGNOSIS — C3412 Malignant neoplasm of upper lobe, left bronchus or lung: Secondary | ICD-10-CM

## 2022-06-20 DIAGNOSIS — Z5111 Encounter for antineoplastic chemotherapy: Secondary | ICD-10-CM

## 2022-06-20 DIAGNOSIS — Z510 Encounter for antineoplastic radiation therapy: Secondary | ICD-10-CM | POA: Diagnosis not present

## 2022-06-20 DIAGNOSIS — C771 Secondary and unspecified malignant neoplasm of intrathoracic lymph nodes: Secondary | ICD-10-CM | POA: Diagnosis not present

## 2022-06-20 DIAGNOSIS — Z87891 Personal history of nicotine dependence: Secondary | ICD-10-CM | POA: Diagnosis not present

## 2022-06-20 LAB — RAD ONC ARIA SESSION SUMMARY
Course Elapsed Days: 1
Plan Fractions Treated to Date: 2
Plan Prescribed Dose Per Fraction: 2 Gy
Plan Total Fractions Prescribed: 35
Plan Total Prescribed Dose: 70 Gy
Reference Point Dosage Given to Date: 4 Gy
Reference Point Session Dosage Given: 2 Gy
Session Number: 2

## 2022-06-20 LAB — CBC WITH DIFFERENTIAL/PLATELET
Abs Immature Granulocytes: 0.01 10*3/uL (ref 0.00–0.07)
Basophils Absolute: 0 10*3/uL (ref 0.0–0.1)
Basophils Relative: 1 %
Eosinophils Absolute: 0.2 10*3/uL (ref 0.0–0.5)
Eosinophils Relative: 3 %
HCT: 32.3 % — ABNORMAL LOW (ref 39.0–52.0)
Hemoglobin: 10.7 g/dL — ABNORMAL LOW (ref 13.0–17.0)
Immature Granulocytes: 0 %
Lymphocytes Relative: 19 %
Lymphs Abs: 1 10*3/uL (ref 0.7–4.0)
MCH: 32 pg (ref 26.0–34.0)
MCHC: 33.1 g/dL (ref 30.0–36.0)
MCV: 96.7 fL (ref 80.0–100.0)
Monocytes Absolute: 0.6 10*3/uL (ref 0.1–1.0)
Monocytes Relative: 11 %
Neutro Abs: 3.3 10*3/uL (ref 1.7–7.7)
Neutrophils Relative %: 66 %
Platelets: 147 10*3/uL — ABNORMAL LOW (ref 150–400)
RBC: 3.34 MIL/uL — ABNORMAL LOW (ref 4.22–5.81)
RDW: 14.7 % (ref 11.5–15.5)
WBC: 5.1 10*3/uL (ref 4.0–10.5)
nRBC: 0 % (ref 0.0–0.2)

## 2022-06-20 LAB — COMPREHENSIVE METABOLIC PANEL
ALT: 13 U/L (ref 0–44)
AST: 19 U/L (ref 15–41)
Albumin: 3.1 g/dL — ABNORMAL LOW (ref 3.5–5.0)
Alkaline Phosphatase: 74 U/L (ref 38–126)
Anion gap: 5 (ref 5–15)
BUN: 24 mg/dL — ABNORMAL HIGH (ref 8–23)
CO2: 25 mmol/L (ref 22–32)
Calcium: 8.4 mg/dL — ABNORMAL LOW (ref 8.9–10.3)
Chloride: 108 mmol/L (ref 98–111)
Creatinine, Ser: 0.97 mg/dL (ref 0.61–1.24)
GFR, Estimated: >60 mL/min (ref >60)
Glucose, Bld: 105 mg/dL — ABNORMAL HIGH (ref 70–99)
Potassium: 3.8 mmol/L (ref 3.5–5.1)
Sodium: 138 mmol/L (ref 135–145)
Total Bilirubin: 0.6 mg/dL (ref 0.3–1.2)
Total Protein: 6.2 g/dL — ABNORMAL LOW (ref 6.5–8.1)

## 2022-06-20 MED ORDER — SODIUM CHLORIDE 0.9 % IV SOLN
10.0000 mg | Freq: Once | INTRAVENOUS | Status: AC
  Administered 2022-06-20: 10 mg via INTRAVENOUS
  Filled 2022-06-20: qty 10

## 2022-06-20 MED ORDER — PALONOSETRON HCL INJECTION 0.25 MG/5ML
0.2500 mg | Freq: Once | INTRAVENOUS | Status: AC
  Administered 2022-06-20: 0.25 mg via INTRAVENOUS
  Filled 2022-06-20: qty 5

## 2022-06-20 MED ORDER — HEPARIN SOD (PORK) LOCK FLUSH 100 UNIT/ML IV SOLN
INTRAVENOUS | Status: AC
  Administered 2022-06-20: 500 [IU]
  Filled 2022-06-20: qty 5

## 2022-06-20 MED ORDER — FAMOTIDINE IN NACL 20-0.9 MG/50ML-% IV SOLN
20.0000 mg | Freq: Once | INTRAVENOUS | Status: AC
  Administered 2022-06-20: 20 mg via INTRAVENOUS
  Filled 2022-06-20: qty 50

## 2022-06-20 MED ORDER — SODIUM CHLORIDE 0.9 % IV SOLN
159.2000 mg | Freq: Once | INTRAVENOUS | Status: AC
  Administered 2022-06-20: 160 mg via INTRAVENOUS
  Filled 2022-06-20: qty 16

## 2022-06-20 MED ORDER — SODIUM CHLORIDE 0.9 % IV SOLN
45.0000 mg/m2 | Freq: Once | INTRAVENOUS | Status: AC
  Administered 2022-06-20: 84 mg via INTRAVENOUS
  Filled 2022-06-20: qty 14

## 2022-06-20 MED ORDER — SODIUM CHLORIDE 0.9 % IV SOLN: 141.0000 mg | Freq: Once | INTRAVENOUS | Status: DC

## 2022-06-20 MED ORDER — HEPARIN SOD (PORK) LOCK FLUSH 100 UNIT/ML IV SOLN
500.0000 [IU] | Freq: Once | INTRAVENOUS | Status: AC | PRN
  Filled 2022-06-20: qty 5

## 2022-06-20 MED ORDER — SODIUM CHLORIDE 0.9 % IV SOLN
Freq: Once | INTRAVENOUS | Status: AC
  Filled 2022-06-20: qty 250

## 2022-06-20 MED ORDER — DIPHENHYDRAMINE HCL 50 MG/ML IJ SOLN
50.0000 mg | Freq: Once | INTRAMUSCULAR | Status: AC
  Administered 2022-06-20: 50 mg via INTRAVENOUS
  Filled 2022-06-20: qty 1

## 2022-06-20 NOTE — Progress Notes (Signed)
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  He was found to have a left upper lobe lung mass measuring 5.5 x 3.7 x 2.8 cm.  It was confluent with the left hilar lymph node.  Enlarged AP window lymph node measuring 16 mm.  Enlarged left paratracheal lymph node.  Low-attenuation 7 mm lesion in the liver.  7 mm cyst in the uncinate process of the pancreas for which follow-up was not recommended.  Bilateral kidney cysts.  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  Prostatic enlargement.   PET CT is can showed hypermetabolic left upper lobe lung mass measuring 5.4 x 3.9 cm with an SUV of 15.1.  Ipsilateral mediastinal lymph node metastases with index lymph node measuring 1.7 with an SUV of 9.8.  No evidence of distant metastatic disease.  Biopsy of the left upper lobe as well as station 4R lymph node was consistent with squamous cell carcinoma  Concurrent chemoradiation with weekly CarboTaxol was recommended followed by consideration for maintenance durvalumab.NGS testing  did not show any actionable mutations.  PD-L1 35%.  MSI stable.  MRI brain was negative for metastatic disease  Interval history-patient is doing well and denies any complaints at this time  ECOG PS- 1 Pain scale- 0   Review of systems- Review of Systems  Constitutional:  Negative for chills, fever, malaise/fatigue and weight loss.  HENT:  Negative for congestion, ear discharge and nosebleeds.   Eyes:  Negative for blurred vision.  Respiratory:  Negative for cough, hemoptysis, sputum production, shortness of breath and wheezing.   Cardiovascular:  Negative for chest pain, palpitations, orthopnea and claudication.  Gastrointestinal:  Negative for abdominal pain, blood in stool, constipation, diarrhea, heartburn, melena, nausea and vomiting.  Genitourinary:  Negative for dysuria, flank pain, frequency, hematuria and urgency.  Musculoskeletal:  Negative for back pain, joint pain and myalgias.  Skin:  Negative for rash.  Neurological:  Negative for dizziness, tingling, focal weakness, seizures, weakness and headaches.  Endo/Heme/Allergies:  Does not bruise/bleed easily.  Psychiatric/Behavioral:  Negative for depression and suicidal ideas. The patient does not have insomnia.       Allergies  Allergen Reactions   Cefdinir Nausea Only    hypersensitivity to smell   Doxycycline     Sun sensitivity   Prednisone     Hallucinations    Sertraline Other (See Comments)    Hallucinations      Past Medical History:  Diagnosis Date   A-fib ([place])    a.) CHA2DS2-VASc = 6 (age x2, HTN, DVT x2, vascular disease history). b.) rate/rhythm maintained on oral carvedilol; chronically anticoagulated using daily warfarin.   Actinic keratosis    Anemia  Aortic atherosclerosis (HCC)    Bilateral renal cysts    a.)  CT abdomen pelvis 04/24/2022: Enhancing septation of the lateral aspect of the mid RIGHT kidney measuring 2.7 x 2.8 cm concerning for cystic renal neoplasm   Bladder calculi    BPH  (benign prostatic hyperplasia)    DDD (degenerative disc disease), lumbar    a.) s/p LEFT laminectomy L5   Diverticulosis    DOE (dyspnea on exertion)    DVT (deep venous thrombosis) (HCC)    Elevated PSA    GERD (gastroesophageal reflux disease)    H/O degenerative disc disease    HLD (hyperlipidemia)    Hypertension    Lipoma of arm    Lipomatosis    Long term current use of anticoagulant    a.) warfarin   Mass of upper lobe of left lung 04/24/2022   a.)  CT chest 04/24/2022: Multilobulated perihilar mass measuring 5.3 x 3.7 x 2.8 cm with associated LEFT hilar and mediastinal LAD; b.)  PET CT 19/41/7408: Hypermetabolic LEFT upper lobe lung mass (max SUV 15.1); ipsilateral nodal metastasis --> presuming a NSC histology, 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   NICM (nonischemic cardiomyopathy) ([place]) 11/22/2015   a.) TTE 11/22/2015: EF 40%. b.) TTE 12/27/2018: EF 45%. c.) TTE 08/30/2020: EF 45-50%.   Night sweats    Obesity    Osteoarthritis    RBBB (right bundle branch block)    Right inguinal hernia    VHD (valvular heart disease) 11/22/2015   a.) TTE 11/22/2015: EF 40%; mod MR, mod-sev TR, triv PR; mod BAE, mild RV enlargement. b.) TTE 12/27/2018: EF 45%; mod MR, mod-sev TR, triv PR; mod BAE, mild RV enlargement. c.) TTE 08/30/2020: EF 45-50%; mild MR/TR.     Past Surgical History:  Procedure Laterality Date   Bilateral Radical Keratotomy Bilateral 1997   COLONOSCOPY WITH PROPOFOL N/A 01/11/2016   Procedure: COLONOSCOPY WITH PROPOFOL;  Surgeon: Manya Silvas, MD;  Location: [facility] ENDOSCOPY;  Service: Endoscopy;  Laterality: N/A; repeat 3 years, tubular adenoma   DENTAL SURGERY     Patient had implants   FLEXIBLE BRONCHOSCOPY Left 05/19/2022   Procedure: FLEXIBLE BRONCHOSCOPY;  Surgeon: Tyler Pita, MD;  Location: [facility] ORS;  Service: Cardiopulmonary;  Laterality: Left;   GUM SURGERY     IR IMAGING GUIDED PORT INSERTION   06/13/2022   L4 - S1 decompression Left    PROSTATE BIOPSY     TONSILLECTOMY AND ADENOIDECTOMY  age 87   VIDEO BRONCHOSCOPY WITH ENDOBRONCHIAL ULTRASOUND Left 05/19/2022   Procedure: VIDEO BRONCHOSCOPY WITH ENDOBRONCHIAL ULTRASOUND;  Surgeon: Tyler Pita, MD;  Location: [facility] ORS;  Service: Cardiopulmonary;  Laterality: Left;   XI ROBOTIC ASSISTED INGUINAL HERNIA REPAIR WITH MESH Right 10/21/2021   Procedure: XI ROBOTIC ASSISTED INGUINAL HERNIA REPAIR WITH MESH;  Surgeon: Ronny Bacon, MD;  Location: [facility] ORS;  Service: General;  Laterality: Right;    Social History   Socioeconomic History   Marital status: Married    Spouse name: Doris   Number of children: 3   Years of education: Not on file   Highest education level: Associate degree: occupational, Hotel manager, or vocational program  Occupational History   Occupation: retired  Tobacco Use   Smoking status: Former    Types: Cigarettes    Quit date: 08/26/1971    Years since quitting: 50.8    Passive exposure: Never   Smokeless tobacco: Never   Tobacco comments:  Smoked for about 20 years.  Vaping Use   Vaping Use: Never used  Substance and Sexual Activity   Alcohol use: Not Currently   Drug use: No   Sexual activity: Not on file  Other Topics Concern   Not on file  Social History Narrative   Not on file   Social Determinants of Health   Financial Resource Strain: Low Risk  (12/18/2021)   Overall Financial Resource Strain (CARDIA)    Difficulty of Paying Living Expenses: Not hard at all  Food Insecurity: No Food Insecurity (12/18/2021)   Hunger Vital Sign    Worried About Running Out of Food in the Last Year: Never true    Ran Out of Food in the Last Year: Never true  Transportation Needs: No Transportation Needs (12/18/2021)   PRAPARE - Hydrologist (Medical): No    Lack of Transportation (Non-Medical): No  Physical Activity: Sufficiently Active (12/18/2021)   Exercise Vital Sign     Days of Exercise per Week: 7 days    Minutes of Exercise per Session: 50 min  Stress: No Stress Concern Present (12/18/2021)   [place]    Feeling of Stress : Only a little  Social Connections: Moderately Integrated (12/18/2021)   Social Connection and Isolation Panel [NHANES]    Frequency of Communication with Friends and Family: Twice a week    Frequency of Social Gatherings with Friends and Family: Once a week    Attends Religious Services: More than 4 times per year    Active Member of [employer] or Organizations: No    Attends Archivist Meetings: Never    Marital Status: Married  Human resources officer Violence: Not At Risk (12/18/2021)   Humiliation, Afraid, Rape, and Kick questionnaire    Fear of Current or Ex-Partner: No    Emotionally Abused: No    Physically Abused: No    Sexually Abused: No    Family History  Problem Relation Age of Onset   Cancer Mother        secondary to cancinoma of the jaw from her dipping snuff.   Heart attack Father    CAD Father    Hypertension Sister    Diabetes Son        Type I diabetes   Prostate cancer Neg Hx    Kidney cancer Neg Hx      Current Outpatient Medications:    amLODipine (NORVASC) 5 MG tablet, TAKE 1 TABLET EVERY DAY, Disp: 90 tablet, Rfl: 3   benazepril (LOTENSIN) 40 MG tablet, TAKE 1 TABLET EVERY DAY, Disp: 90 tablet, Rfl: 3   carvedilol (COREG) 6.25 MG tablet, TAKE 1 TABLET TWICE DAILY, Disp: 180 tablet, Rfl: 3   Cyanocobalamin (VITAMIN B 12 PO), Take 1 capsule by mouth 2 (two) times daily. 1000 mcg each capsule, Disp: , Rfl:    doxazosin (CARDURA) 8 MG tablet, TAKE 1 TABLET EVERY DAY, Disp: 90 tablet, Rfl: 3   finasteride (PROSCAR) 5 MG tablet, Take 1 tablet (5 mg total) by mouth daily., Disp: 90 tablet, Rfl: 3   gabapentin (NEURONTIN) 300 MG capsule, TAKE 1 CAPSULE EVERY MORNING, 1 CAPSULE IN THE AFTERNOON AND 2 CAPSULES AT BEDTIME, Disp: 360  capsule, Rfl: 3   lidocaine-prilocaine (EMLA) cream, Apply to affected area once, Disp: 30 g, Rfl: 3   Magnesium 500 MG TABS, Take 500 mg by mouth daily., Disp: , Rfl:    Multiple Vitamin (MULTI-VITAMIN) tablet,  Take 1 tablet by mouth daily. , Disp: , Rfl:    triamterene-hydrochlorothiazide (MAXZIDE-25) 37.5-25 MG tablet, TAKE 1/2 TABLET EVERY DAY, Disp: 45 tablet, Rfl: 0   warfarin (COUMADIN) 4 MG tablet, TAKE 1 TABLET EVERY DAY IN THE AFTERNOON (Patient taking differently: 5 mg,  Mon, Weds, Fri, 4 mg the rest of the week), Disp: 90 tablet, Rfl: 0   dexamethasone (DECADRON) 4 MG tablet, Take 2 tablets daily for 2 days, start the day after chemotherapy. Take with food. (Patient not taking: Reported on 06/20/2022), Disp: 30 tablet, Rfl: 1   ondansetron (ZOFRAN) 8 MG tablet, Take 1 tablet (8 mg total) by mouth every 8 (eight) hours as needed for nausea or vomiting. Start on the third day after chemotherapy. (Patient not taking: Reported on 06/20/2022), Disp: 30 tablet, Rfl: 1   prochlorperazine (COMPAZINE) 10 MG tablet, Take 1 tablet (10 mg total) by mouth every 6 (six) hours as needed for nausea or vomiting. (Patient not taking: Reported on 06/20/2022), Disp: 30 tablet, Rfl: 1  Physical exam:  Vitals:   06/20/22 0858  BP: (!) 157/90  Pulse: (!) 59  Resp: 16  Temp: 97.7 F (36.5 C)  TempSrc: Oral  Weight: 159 lb 12.8 oz (72.5 kg)  Height: _0  (1.753 m)   Physical Exam Constitutional:      General: He is not in acute distress. Cardiovascular:     Rate and Rhythm: Normal rate and regular rhythm.     Heart sounds: Normal heart sounds.  Pulmonary:     Effort: Pulmonary effort is normal.     Breath sounds: Normal breath sounds.  Skin:    General: Skin is warm and dry.  Neurological:     Mental Status: He is alert and oriented to person, place, and time.         Latest Ref Rng & Units 04/22/2022    3:43 PM  CMP  Glucose 70 - 99 mg/dL 100   BUN 8 - 27 mg/dL 18   Creatinine 0.76 -  1.27 mg/dL 1.21   Sodium 134 - 144 mmol/L 141   Potassium 3.5 - 5.2 mmol/L 4.0   Chloride 96 - 106 mmol/L 105   CO2 20 - 29 mmol/L 23   Calcium 8.6 - 10.2 mg/dL 9.3   Total Protein 6.0 - 8.5 g/dL 6.1   Total Bilirubin 0.0 - 1.2 mg/dL 1.1   Alkaline Phos 44 - 121 IU/L 79   AST 0 - 40 IU/L 16   ALT 0 - 44 IU/L 8       Latest Ref Rng & Units 06/20/2022    8:26 AM  CBC  WBC 4.0 - 10.5 K/uL 5.1   Hemoglobin 13.0 - 17.0 g/dL 10.7   Hematocrit 39.0 - 52.0 % 32.3   Platelets 150 - 400 K/uL 147     No images are attached to the encounter.  IR IMAGING GUIDED PORT INSERTION  Result Date: 06/13/2022 INDICATION: Left upper lobe lung cancer. Patient requires durable venous access to allow for chemotherapy. EXAM: IMPLANTED PORT A CATH PLACEMENT WITH ULTRASOUND AND FLUOROSCOPIC GUIDANCE MEDICATIONS: None. ANESTHESIA/SEDATION: Versed 1 mg IV; Fentanyl 50 mcg IV; Moderate Sedation Time:  18 minutes The patient's vital signs and level of consciousness were continuously monitored during the procedure by the interventional radiology nurse under my direct supervision. FLUOROSCOPY: Radiation exposure index: 1.5 mGy reference air kerma COMPLICATIONS: None immediate. PROCEDURE: The right neck and chest was prepped with chlorhexidine, and draped in the usual sterile  fashion using maximum barrier technique (cap and mask, sterile gown, sterile gloves, large sterile sheet, hand hygiene and cutaneous antiseptic). Local anesthesia was attained by infiltration with 1% lidocaine with epinephrine. Ultrasound demonstrated patency of the right internal jugular vein, and this was documented with an image. Under real-time ultrasound guidance, this vein was accessed with a 21 gauge micropuncture needle and image documentation was performed. A small dermatotomy was made at the access site with an 11 scalpel. A 0.018" wire was advanced into the SVC and the access needle exchanged for a 64F micropuncture vascular sheath. The 0.018"  wire was then removed and a 0.035" wire advanced into the IVC. An appropriate location for the subcutaneous reservoir was selected below the clavicle and an incision was made through the skin and underlying soft tissues. The subcutaneous tissues were then dissected using a combination of blunt and sharp surgical technique and a pocket was formed. A single lumen power injectable portacatheter was then tunneled through the subcutaneous tissues from the pocket to the dermatotomy and the port reservoir placed within the subcutaneous pocket. The venous access site was then serially dilated and a peel away vascular sheath placed over the wire. The wire was removed and the port catheter advanced into position under fluoroscopic guidance. The catheter tip is positioned in the superior cavoatrial junction. This was documented with a spot image. The portacatheter was then tested and found to flush and aspirate well. The port was flushed with saline followed by 100 units/mL heparinized saline. The pocket was then closed in two layers using first subdermal inverted interrupted absorbable sutures followed by a running subcuticular suture. The epidermis was then sealed with Dermabond. The dermatotomy at the venous access site was also closed with Dermabond. IMPRESSION: Successful placement of a right IJ approach Power Port with ultrasound and fluoroscopic guidance. The catheter is ready for use. Electronically Signed   By: Jacqulynn Cadet M.D.   On: 06/13/2022 16:58   MR Brain W Wo Contrast  Result Date: 05/26/2022 CLINICAL DATA:  Lung nodule EXAM: MRI HEAD WITHOUT AND WITH CONTRAST TECHNIQUE: Multiplanar, multiecho pulse sequences of the brain and surrounding structures were obtained without and with intravenous contrast. CONTRAST:  7 cc vueway intravenous COMPARISON:  None Available. FINDINGS: Brain: No abnormal enhancement to suggest metastatic disease. Remote insult in the subcortical high left frontal lobe with chronic  blood products, nonenhancing. Additional small focus of remote hemorrhage in the superior right cerebellum, also nonenhancing. Generalized cerebral volume loss. No acute or subacute infarct, widespread hemorrhagic injury, hydrocephalus, or collection. Vascular: Normal flow voids. Skull and upper cervical spine: Normal marrow signal. Sinuses/Orbits: Negative. IMPRESSION: Negative for metastatic disease. Electronically Signed   By: Jorje Guild M.D.   On: 05/26/2022 18:37   DG SWALLOW FUNC OP MEDICARE SPEECH PATH  Result Date: 05/22/2022 CLINICAL DATA:  Dysphagia. Cough EXAM: MODIFIED BARIUM SWALLOW TECHNIQUE: Different consistencies of barium were administered orally to the patient by the Speech Pathologist. Imaging of the pharynx was performed in the lateral projection. The radiologist was present in the fluoroscopy room for this study, providing personal supervision. FLUOROSCOPY: Radiation Exposure Index (as provided by the fluoroscopic device): 13 mGy COMPARISON:  None Available. FINDINGS: Vestibular  Penetration:  Laryngeal penetrations. Aspiration: Tracheal aspiration with thin liquids without a cough reflex. Other:  None. IMPRESSION: Modified barium swallow as described above. Please refer to the Speech Pathologists report for complete details and recommendations. Electronically Signed   By: Kathreen Devoid M.D.   On: 05/22/2022 15:41  Assessment and plan- Patient is a 87 y.o. male with stage IIIb squamous cell carcinoma of the left upper lobe T3 N2 M0 here for on treatment assessment prior to cycle 1 of weekly CarboTaxol chemotherapy  Counts okay to proceed with cycle 1 of weekly CarboTaxol chemotherapy today.  He will directly proceed for cycle 2 next week and I will see him back in 2 weeks for cycle 3.  Plan is to do 7 cycles along with radiation.  Discussed risks and benefits of chemotherapy again including all but not limited to nausea, vomiting, low blood counts, risk of infusion reaction,  risk of infections and hospitalization.  Treatment is being given with a curative intent.  There is a mention of prednisone as an allergy causing hallucinations.  Patient's wife states that this was probably several years ago.  He will require Decadron as a part of premedication today and I have asked him to take 1 instead of 2 tablets of Decadron tomorrow and day after for chemo-induced nausea prophylaxis.   Visit Diagnosis 1. Encounter for antineoplastic chemotherapy   2. Primary malignant neoplasm of left upper lobe of lung ([place])      Dr. Randa Evens, MD, MPH [facility] at [facility] [phone] 06/20/2022 8:54 AM

## 2022-06-20 NOTE — Patient Instructions (Signed)
[facility] CANCER CTR AT [place]  Discharge Instructions: Thank you for choosing [place] to provide your oncology and hematology care.  If you have a lab appointment with the [place], please go directly to the [place] and check in at the registration area.  Wear comfortable clothing and clothing appropriate for easy access to any Portacath or PICC line.   We strive to give you quality time with your provider. You may need to reschedule your appointment if you arrive late (15 or more minutes).  Arriving late affects you and other patients whose appointments are after yours.  Also, if you miss three or more appointments without notifying the office, you may be dismissed from the clinic at the provider's discretion.      For prescription refill requests, have your pharmacy contact our office and allow 72 hours for refills to be completed.    Today you received the following chemotherapy and/or immunotherapy agents Taxol and Carboplatin        To help prevent nausea and vomiting after your treatment, we encourage you to take your nausea medication as directed.  BELOW ARE SYMPTOMS THAT SHOULD BE REPORTED IMMEDIATELY: *FEVER GREATER THAN 100.4 F (38 C) OR HIGHER *CHILLS OR SWEATING *NAUSEA AND VOMITING THAT IS NOT CONTROLLED WITH YOUR NAUSEA MEDICATION *UNUSUAL SHORTNESS OF BREATH *UNUSUAL BRUISING OR BLEEDING *URINARY PROBLEMS (pain or burning when urinating, or frequent urination) *BOWEL PROBLEMS (unusual diarrhea, constipation, pain near the anus) TENDERNESS IN MOUTH AND THROAT WITH OR WITHOUT PRESENCE OF ULCERS (sore throat, sores in mouth, or a toothache) UNUSUAL RASH, SWELLING OR PAIN  UNUSUAL VAGINAL DISCHARGE OR ITCHING   Items with * indicate a potential emergency and should be followed up as soon as possible or go to the Emergency Department if any problems should occur.  Please show the CHEMOTHERAPY ALERT CARD or IMMUNOTHERAPY ALERT CARD  at check-in to the Emergency Department and triage nurse.  Should you have questions after your visit or need to cancel or reschedule your appointment, please contact [facility] CANCER [place] AT [place]  [phone] and follow the prompts.  Office hours are 8:00 a.m. to 4:30 p.m. Monday - Friday. Please note that voicemails left after 4:00 p.m. may not be returned until the following business day.  We are closed weekends and major holidays. You have access to a nurse at all times for urgent questions. Please call the main number to the clinic [phone] and follow the prompts.  For any non-urgent questions, you may also contact your provider using MyChart. We now offer e-Visits for anyone 74 and older to request care online for non-urgent symptoms. For details visit [email or web address].   Also download the MyChart app! Go to the app store, search "MyChart", open the app, select [place], and log in with your MyChart username and password.  Masks are optional in the cancer centers. If you would like for your care team to wear a mask while they are taking care of you, please let them know. For doctor visits, patients may have with them one support person who is at least 85 years old. At this time, visitors are not allowed in the infusion area.   Paclitaxel Injection What is this medication? PACLITAXEL (PAK li TAX el) treats some types of cancer. It works by slowing down the growth of cancer cells. This medicine may be used for other purposes; ask your health care provider or pharmacist if you have questions. COMMON BRAND NAME(S): Onxol, Taxol What  should I tell my care team before I take this medication? They need to know if you have any of these conditions: Heart disease Liver disease Low white blood cell levels An unusual or allergic reaction to paclitaxel, other medications, foods, dyes, or preservatives If you or your partner are pregnant or trying to get  pregnant Breast-feeding How should I use this medication? This medication is injected into a vein. It is given by your care team in a hospital or clinic setting. Talk to your care team about the use of this medication in children. While it may be given to children for selected conditions, precautions do apply. Overdosage: If you think you have taken too much of this medicine contact a poison control center or emergency room at once. NOTE: This medicine is only for you. Do not share this medicine with others. What if I miss a dose? Keep appointments for follow-up doses. It is important not to miss your dose. Call your care team if you are unable to keep an appointment. What may interact with this medication? Do not take this medication with any of the following: Live virus vaccines Other medications may affect the way this medication works. Talk with your care team about all of the medications you take. They may suggest changes to your treatment plan to lower the risk of side effects and to make sure your medications work as intended. This list may not describe all possible interactions. Give your health care provider a list of all the medicines, herbs, non-prescription drugs, or dietary supplements you use. Also tell them if you smoke, drink alcohol, or use illegal drugs. Some items may interact with your medicine. What should I watch for while using this medication? Your condition will be monitored carefully while you are receiving this medication. You may need blood work while taking this medication. This medication may make you feel generally unwell. This is not uncommon as chemotherapy can affect healthy cells as well as cancer cells. Report any side effects. Continue your course of treatment even though you feel ill unless your care team tells you to stop. This medication can cause serious allergic reactions. To reduce the risk, your care team may give you other medications to take before  receiving this one. Be sure to follow the directions from your care team. This medication may increase your risk of getting an infection. Call your care team for advice if you get a fever, chills, sore throat, or other symptoms of a cold or flu. Do not treat yourself. Try to avoid being around people who are sick. This medication may increase your risk to bruise or bleed. Call your care team if you notice any unusual bleeding. Be careful brushing or flossing your teeth or using a toothpick because you may get an infection or bleed more easily. If you have any dental work done, tell your dentist you are receiving this medication. Talk to your care team if you may be pregnant. Serious birth defects can occur if you take this medication during pregnancy. Talk to your care team before breastfeeding. Changes to your treatment plan may be needed. What side effects may I notice from receiving this medication? Side effects that you should report to your care team as soon as possible: Allergic reactions--skin rash, itching, hives, swelling of the face, lips, tongue, or throat Heart rhythm changes--fast or irregular heartbeat, dizziness, feeling faint or lightheaded, chest pain, trouble breathing Increase in blood pressure Infection--fever, chills, cough, sore throat, wounds that don't  heal, pain or trouble when passing urine, general feeling of discomfort or being unwell Low blood pressure--dizziness, feeling faint or lightheaded, blurry vision Low red blood cell level--unusual weakness or fatigue, dizziness, headache, trouble breathing Painful swelling, warmth, or redness of the skin, blisters or sores at the infusion site Pain, tingling, or numbness in the hands or feet Slow heartbeat--dizziness, feeling faint or lightheaded, confusion, trouble breathing, unusual weakness or fatigue Unusual bruising or bleeding Side effects that usually do not require medical attention (report to your care team if they  continue or are bothersome): Diarrhea Hair loss Joint pain Loss of appetite Muscle pain Nausea Vomiting This list may not describe all possible side effects. Call your doctor for medical advice about side effects. You may report side effects to FDA at 1-800-FDA-1088. Where should I keep my medication? This medication is given in a hospital or clinic. It will not be stored at home. NOTE: This sheet is a summary. It may not cover all possible information. If you have questions about this medicine, talk to your doctor, pharmacist, or health care provider.  2023 Elsevier/Gold Standard (2021-12-11 00:00:00)    Carboplatin Injection What is this medication? CARBOPLATIN (KAR boe pla tin) treats some types of cancer. It works by slowing down the growth of cancer cells. This medicine may be used for other purposes; ask your health care provider or pharmacist if you have questions. COMMON BRAND NAME(S): Paraplatin What should I tell my care team before I take this medication? They need to know if you have any of these conditions: Blood disorders Hearing problems Kidney disease Recent or ongoing radiation therapy An unusual or allergic reaction to carboplatin, cisplatin, other medications, foods, dyes, or preservatives Pregnant or trying to get pregnant Breast-feeding How should I use this medication? This medication is injected into a vein. It is given by your care team in a hospital or clinic setting. Talk to your care team about the use of this medication in children. Special care may be needed. Overdosage: If you think you have taken too much of this medicine contact a poison control center or emergency room at once. NOTE: This medicine is only for you. Do not share this medicine with others. What if I miss a dose? Keep appointments for follow-up doses. It is important not to miss your dose. Call your care team if you are unable to keep an appointment. What may interact with this  medication? Medications for seizures Some antibiotics, such as amikacin, gentamicin, neomycin, streptomycin, tobramycin Vaccines This list may not describe all possible interactions. Give your health care provider a list of all the medicines, herbs, non-prescription drugs, or dietary supplements you use. Also tell them if you smoke, drink alcohol, or use illegal drugs. Some items may interact with your medicine. What should I watch for while using this medication? Your condition will be monitored carefully while you are receiving this medication. You may need blood work while taking this medication. This medication may make you feel generally unwell. This is not uncommon, as chemotherapy can affect healthy cells as well as cancer cells. Report any side effects. Continue your course of treatment even though you feel ill unless your care team tells you to stop. In some cases, you may be given additional medications to help with side effects. Follow all directions for their use. This medication may increase your risk of getting an infection. Call your care team for advice if you get a fever, chills, sore throat, or other symptoms of  a cold or flu. Do not treat yourself. Try to avoid being around people who are sick. Avoid taking medications that contain aspirin, acetaminophen, ibuprofen, naproxen, or ketoprofen unless instructed by your care team. These medications may hide a fever. Be careful brushing or flossing your teeth or using a toothpick because you may get an infection or bleed more easily. If you have any dental work done, tell your dentist you are receiving this medication. Talk to your care team if you wish to become pregnant or think you might be pregnant. This medication can cause serious birth defects. Talk to your care team about effective forms of contraception. Do not breast-feed while taking this medication. What side effects may I notice from receiving this medication? Side effects  that you should report to your care team as soon as possible: Allergic reactions--skin rash, itching, hives, swelling of the face, lips, tongue, or throat Infection--fever, chills, cough, sore throat, wounds that don't heal, pain or trouble when passing urine, general feeling of discomfort or being unwell Low red blood cell level--unusual weakness or fatigue, dizziness, headache, trouble breathing Pain, tingling, or numbness in the hands or feet, muscle weakness, change in vision, confusion or trouble speaking, loss of balance or coordination, trouble walking, seizures Unusual bruising or bleeding Side effects that usually do not require medical attention (report to your care team if they continue or are bothersome): Hair loss Nausea Unusual weakness or fatigue Vomiting This list may not describe all possible side effects. Call your doctor for medical advice about side effects. You may report side effects to FDA at 1-800-FDA-1088. Where should I keep my medication? This medication is given in a hospital or clinic. It will not be stored at home. NOTE: This sheet is a summary. It may not cover all possible information. If you have questions about this medicine, talk to your doctor, pharmacist, or health care provider.  2023 Elsevier/Gold Standard (2021-11-25 00:00:00)

## 2022-06-20 NOTE — Progress Notes (Signed)
Met with patient and his wife during follow up visit with Dr. Janese Banks to start chemotherapy treatments. All questions answered during visit. Pt did not have any further questions or needs. Will follow up again next week. Nothing further needed at this time.

## 2022-06-23 ENCOUNTER — Ambulatory Visit
Admission: RE | Admit: 2022-06-23 | Discharge: 2022-06-23 | Disposition: A | Discharge status: Home / Self Care | Payer: Medicare HMO | Source: Ambulatory Visit | Attending: Radiation Oncology | Admitting: Radiation Oncology

## 2022-06-23 ENCOUNTER — Telehealth: Payer: Self-pay | Admitting: *Deleted

## 2022-06-23 ENCOUNTER — Other Ambulatory Visit: Payer: Self-pay

## 2022-06-23 DIAGNOSIS — Z510 Encounter for antineoplastic radiation therapy: Secondary | ICD-10-CM | POA: Diagnosis not present

## 2022-06-23 DIAGNOSIS — Z5111 Encounter for antineoplastic chemotherapy: Secondary | ICD-10-CM | POA: Diagnosis not present

## 2022-06-23 DIAGNOSIS — C3412 Malignant neoplasm of upper lobe, left bronchus or lung: Secondary | ICD-10-CM | POA: Diagnosis not present

## 2022-06-23 DIAGNOSIS — C771 Secondary and unspecified malignant neoplasm of intrathoracic lymph nodes: Secondary | ICD-10-CM | POA: Diagnosis not present

## 2022-06-23 DIAGNOSIS — Z87891 Personal history of nicotine dependence: Secondary | ICD-10-CM | POA: Diagnosis not present

## 2022-06-23 LAB — RAD ONC ARIA SESSION SUMMARY
Course Elapsed Days: 4
Plan Fractions Treated to Date: 3
Plan Prescribed Dose Per Fraction: 2 Gy
Plan Total Fractions Prescribed: 35
Plan Total Prescribed Dose: 70 Gy
Reference Point Dosage Given to Date: 6 Gy
Reference Point Session Dosage Given: 2 Gy
Session Number: 3

## 2022-06-23 NOTE — Telephone Encounter (Signed)
Received a call from pt.' Wife. Seh has on the rx for dexamethasone 2 tablets twice a day for 2 days after each chemo. I had printed off the pt's medication list and wrote out that because pt has issues with prednisone and we did not want  to make anything worse with it being a steroid. So Dr. Janese Banks waned him to have 1 tablet daily for 2 days after each treatment. Wife understands and she wanted to make sure.

## 2022-06-24 ENCOUNTER — Ambulatory Visit
Admission: RE | Admit: 2022-06-24 | Discharge: 2022-06-24 | Disposition: A | Discharge status: Home / Self Care | Payer: Medicare HMO | Source: Ambulatory Visit | Attending: Radiation Oncology | Admitting: Radiation Oncology

## 2022-06-24 ENCOUNTER — Other Ambulatory Visit: Payer: Self-pay

## 2022-06-24 DIAGNOSIS — Z87891 Personal history of nicotine dependence: Secondary | ICD-10-CM | POA: Diagnosis not present

## 2022-06-24 DIAGNOSIS — Z5111 Encounter for antineoplastic chemotherapy: Secondary | ICD-10-CM | POA: Diagnosis not present

## 2022-06-24 DIAGNOSIS — Z510 Encounter for antineoplastic radiation therapy: Secondary | ICD-10-CM | POA: Diagnosis not present

## 2022-06-24 DIAGNOSIS — C3412 Malignant neoplasm of upper lobe, left bronchus or lung: Secondary | ICD-10-CM | POA: Diagnosis not present

## 2022-06-24 DIAGNOSIS — C771 Secondary and unspecified malignant neoplasm of intrathoracic lymph nodes: Secondary | ICD-10-CM | POA: Diagnosis not present

## 2022-06-24 LAB — RAD ONC ARIA SESSION SUMMARY
Course Elapsed Days: 5
Plan Fractions Treated to Date: 4
Plan Prescribed Dose Per Fraction: 2 Gy
Plan Total Fractions Prescribed: 35
Plan Total Prescribed Dose: 70 Gy
Reference Point Dosage Given to Date: 8 Gy
Reference Point Session Dosage Given: 2 Gy
Session Number: 4

## 2022-06-25 ENCOUNTER — Other Ambulatory Visit: Payer: Self-pay

## 2022-06-25 ENCOUNTER — Inpatient Hospital Stay: Payer: Medicare HMO

## 2022-06-25 ENCOUNTER — Telehealth: Payer: Self-pay | Admitting: *Deleted

## 2022-06-25 ENCOUNTER — Ambulatory Visit
Admission: RE | Admit: 2022-06-25 | Discharge: 2022-06-25 | Disposition: A | Discharge status: Home / Self Care | Payer: Medicare HMO | Source: Ambulatory Visit | Attending: Radiation Oncology | Admitting: Radiation Oncology

## 2022-06-25 ENCOUNTER — Ambulatory Visit (INDEPENDENT_AMBULATORY_CARE_PROVIDER_SITE_OTHER): Payer: Medicare HMO

## 2022-06-25 DIAGNOSIS — N400 Enlarged prostate without lower urinary tract symptoms: Secondary | ICD-10-CM | POA: Insufficient documentation

## 2022-06-25 DIAGNOSIS — C771 Secondary and unspecified malignant neoplasm of intrathoracic lymph nodes: Secondary | ICD-10-CM | POA: Insufficient documentation

## 2022-06-25 DIAGNOSIS — Z510 Encounter for antineoplastic radiation therapy: Secondary | ICD-10-CM | POA: Diagnosis not present

## 2022-06-25 DIAGNOSIS — C3412 Malignant neoplasm of upper lobe, left bronchus or lung: Secondary | ICD-10-CM | POA: Insufficient documentation

## 2022-06-25 DIAGNOSIS — I480 Paroxysmal atrial fibrillation: Secondary | ICD-10-CM

## 2022-06-25 DIAGNOSIS — D6959 Other secondary thrombocytopenia: Secondary | ICD-10-CM | POA: Insufficient documentation

## 2022-06-25 DIAGNOSIS — N281 Cyst of kidney, acquired: Secondary | ICD-10-CM | POA: Insufficient documentation

## 2022-06-25 DIAGNOSIS — R350 Frequency of micturition: Secondary | ICD-10-CM

## 2022-06-25 DIAGNOSIS — Z87891 Personal history of nicotine dependence: Secondary | ICD-10-CM | POA: Insufficient documentation

## 2022-06-25 DIAGNOSIS — D701 Agranulocytosis secondary to cancer chemotherapy: Secondary | ICD-10-CM | POA: Insufficient documentation

## 2022-06-25 DIAGNOSIS — Z452 Encounter for adjustment and management of vascular access device: Secondary | ICD-10-CM | POA: Insufficient documentation

## 2022-06-25 DIAGNOSIS — Z5111 Encounter for antineoplastic chemotherapy: Secondary | ICD-10-CM | POA: Insufficient documentation

## 2022-06-25 LAB — URINALYSIS, COMPLETE (UACMP) WITH MICROSCOPIC
Bacteria, UA: NONE SEEN
Bilirubin Urine: NEGATIVE
Glucose, UA: NEGATIVE mg/dL
Hgb urine dipstick: NEGATIVE
Ketones, ur: NEGATIVE mg/dL
Leukocytes,Ua: NEGATIVE
Nitrite: NEGATIVE
Protein, ur: NEGATIVE mg/dL
Specific Gravity, Urine: 1.014 (ref 1.005–1.030)
pH: 7 (ref 5.0–8.0)

## 2022-06-25 LAB — POCT INR
INR: 2.4 (ref 2.0–3.0)
POC INR: 29.4

## 2022-06-25 LAB — RAD ONC ARIA SESSION SUMMARY
Course Elapsed Days: 6
Plan Fractions Treated to Date: 5
Plan Prescribed Dose Per Fraction: 2 Gy
Plan Total Fractions Prescribed: 35
Plan Total Prescribed Dose: 70 Gy
Reference Point Dosage Given to Date: 10 Gy
Reference Point Session Dosage Given: 2 Gy
Session Number: 5

## 2022-06-25 NOTE — Addendum Note (Signed)
Addended by: Althea Charon D on: 06/25/2022 02:19 PM   Modules accepted: Level of Service

## 2022-06-25 NOTE — Telephone Encounter (Signed)
-----   Message from Sindy Guadeloupe, MD sent at 06/25/2022 12:34 PM EDT ----- UA negative

## 2022-06-25 NOTE — Telephone Encounter (Signed)
Wife called to say that pt is urinating on himself and urinating frequently. Per the pt. He has burning too. Per [place] in radiation dr Baruch Gouty rec: AZO and drink cranberry juice. The wife wanted to see if he has UTI and dr Janese Banks ok with that. Pt was coming to radiation at 10 am and I spoke ot [place] and brought the cup with wipes for pt to get sample. Holley Dexter will call me when he gets urine collected and I will take it to the lab. I called the wife back to tell her the plan. She is ok with urine collection

## 2022-06-25 NOTE — Patient Instructions (Signed)
Description   Continue 4 mg daily except 5 mg on M-W-F F/U 4 weeks

## 2022-06-25 NOTE — Telephone Encounter (Signed)
I called the home phone number and got the voicemail and left a message that the urinalysis that we did on Casey Gallegos come back with no bacteria everything was normal.  He will have a urine culture that is being looked at and it can take up to 5 days and at the fifth day if they do not see any kind of abnormality then we consider it negative .  So we will give you a call if we see anything pop up with the urine culture.  So left my telephone #[phone] have any questions

## 2022-06-26 ENCOUNTER — Ambulatory Visit
Admission: RE | Admit: 2022-06-26 | Discharge: 2022-06-26 | Disposition: A | Discharge status: Home / Self Care | Payer: Medicare HMO | Source: Ambulatory Visit | Attending: Radiation Oncology | Admitting: Radiation Oncology

## 2022-06-26 ENCOUNTER — Other Ambulatory Visit: Payer: Self-pay

## 2022-06-26 DIAGNOSIS — N281 Cyst of kidney, acquired: Secondary | ICD-10-CM | POA: Diagnosis not present

## 2022-06-26 DIAGNOSIS — C3412 Malignant neoplasm of upper lobe, left bronchus or lung: Secondary | ICD-10-CM | POA: Diagnosis not present

## 2022-06-26 DIAGNOSIS — Z452 Encounter for adjustment and management of vascular access device: Secondary | ICD-10-CM | POA: Diagnosis not present

## 2022-06-26 DIAGNOSIS — Z87891 Personal history of nicotine dependence: Secondary | ICD-10-CM | POA: Diagnosis not present

## 2022-06-26 DIAGNOSIS — C771 Secondary and unspecified malignant neoplasm of intrathoracic lymph nodes: Secondary | ICD-10-CM | POA: Diagnosis not present

## 2022-06-26 DIAGNOSIS — N400 Enlarged prostate without lower urinary tract symptoms: Secondary | ICD-10-CM | POA: Diagnosis not present

## 2022-06-26 DIAGNOSIS — D701 Agranulocytosis secondary to cancer chemotherapy: Secondary | ICD-10-CM | POA: Diagnosis not present

## 2022-06-26 DIAGNOSIS — D6959 Other secondary thrombocytopenia: Secondary | ICD-10-CM | POA: Diagnosis not present

## 2022-06-26 DIAGNOSIS — Z510 Encounter for antineoplastic radiation therapy: Secondary | ICD-10-CM | POA: Diagnosis not present

## 2022-06-26 LAB — RAD ONC ARIA SESSION SUMMARY
Course Elapsed Days: 7
Plan Fractions Treated to Date: 6
Plan Prescribed Dose Per Fraction: 2 Gy
Plan Total Fractions Prescribed: 35
Plan Total Prescribed Dose: 70 Gy
Reference Point Dosage Given to Date: 12 Gy
Reference Point Session Dosage Given: 2 Gy
Session Number: 6

## 2022-06-26 LAB — URINE CULTURE: Culture: NO GROWTH

## 2022-06-27 ENCOUNTER — Inpatient Hospital Stay: Payer: Medicare HMO

## 2022-06-27 ENCOUNTER — Ambulatory Visit
Admission: RE | Admit: 2022-06-27 | Discharge: 2022-06-27 | Disposition: A | Discharge status: Home / Self Care | Payer: Medicare HMO | Source: Ambulatory Visit | Attending: Radiation Oncology | Admitting: Radiation Oncology

## 2022-06-27 ENCOUNTER — Other Ambulatory Visit: Payer: Self-pay

## 2022-06-27 VITALS — BP 150/87 | HR 52 | Temp 97.0°F | Resp 17 | Wt 157.7 lb

## 2022-06-27 DIAGNOSIS — Z87891 Personal history of nicotine dependence: Secondary | ICD-10-CM | POA: Diagnosis not present

## 2022-06-27 DIAGNOSIS — Z510 Encounter for antineoplastic radiation therapy: Secondary | ICD-10-CM | POA: Diagnosis not present

## 2022-06-27 DIAGNOSIS — C3412 Malignant neoplasm of upper lobe, left bronchus or lung: Secondary | ICD-10-CM

## 2022-06-27 DIAGNOSIS — N400 Enlarged prostate without lower urinary tract symptoms: Secondary | ICD-10-CM | POA: Diagnosis not present

## 2022-06-27 DIAGNOSIS — N281 Cyst of kidney, acquired: Secondary | ICD-10-CM | POA: Diagnosis not present

## 2022-06-27 DIAGNOSIS — D701 Agranulocytosis secondary to cancer chemotherapy: Secondary | ICD-10-CM | POA: Diagnosis not present

## 2022-06-27 DIAGNOSIS — Z452 Encounter for adjustment and management of vascular access device: Secondary | ICD-10-CM | POA: Diagnosis not present

## 2022-06-27 DIAGNOSIS — D6959 Other secondary thrombocytopenia: Secondary | ICD-10-CM | POA: Diagnosis not present

## 2022-06-27 DIAGNOSIS — C771 Secondary and unspecified malignant neoplasm of intrathoracic lymph nodes: Secondary | ICD-10-CM | POA: Diagnosis not present

## 2022-06-27 LAB — COMPREHENSIVE METABOLIC PANEL
ALT: 14 U/L (ref 0–44)
AST: 19 U/L (ref 15–41)
Albumin: 3.2 g/dL — ABNORMAL LOW (ref 3.5–5.0)
Alkaline Phosphatase: 71 U/L (ref 38–126)
Anion gap: 4 — ABNORMAL LOW (ref 5–15)
BUN: 26 mg/dL — ABNORMAL HIGH (ref 8–23)
CO2: 25 mmol/L (ref 22–32)
Calcium: 8.7 mg/dL — ABNORMAL LOW (ref 8.9–10.3)
Chloride: 107 mmol/L (ref 98–111)
Creatinine, Ser: 0.87 mg/dL (ref 0.61–1.24)
GFR, Estimated: >60 mL/min (ref >60)
Glucose, Bld: 90 mg/dL (ref 70–99)
Potassium: 4.3 mmol/L (ref 3.5–5.1)
Sodium: 136 mmol/L (ref 135–145)
Total Bilirubin: 1.1 mg/dL (ref 0.3–1.2)
Total Protein: 6.1 g/dL — ABNORMAL LOW (ref 6.5–8.1)

## 2022-06-27 LAB — CBC WITH DIFFERENTIAL/PLATELET
Abs Immature Granulocytes: 0.02 10*3/uL (ref 0.00–0.07)
Basophils Absolute: 0 10*3/uL (ref 0.0–0.1)
Basophils Relative: 1 %
Eosinophils Absolute: 0.1 10*3/uL (ref 0.0–0.5)
Eosinophils Relative: 4 %
HCT: 32.2 % — ABNORMAL LOW (ref 39.0–52.0)
Hemoglobin: 10.6 g/dL — ABNORMAL LOW (ref 13.0–17.0)
Immature Granulocytes: 1 %
Lymphocytes Relative: 20 %
Lymphs Abs: 0.8 10*3/uL (ref 0.7–4.0)
MCH: 31.6 pg (ref 26.0–34.0)
MCHC: 32.9 g/dL (ref 30.0–36.0)
MCV: 96.1 fL (ref 80.0–100.0)
Monocytes Absolute: 0.3 10*3/uL (ref 0.1–1.0)
Monocytes Relative: 8 %
Neutro Abs: 2.6 10*3/uL (ref 1.7–7.7)
Neutrophils Relative %: 66 %
Platelets: 151 10*3/uL (ref 150–400)
RBC: 3.35 MIL/uL — ABNORMAL LOW (ref 4.22–5.81)
RDW: 14.5 % (ref 11.5–15.5)
WBC: 3.9 10*3/uL — ABNORMAL LOW (ref 4.0–10.5)
nRBC: 0 % (ref 0.0–0.2)

## 2022-06-27 LAB — RAD ONC ARIA SESSION SUMMARY
Course Elapsed Days: 8
Plan Fractions Treated to Date: 7
Plan Prescribed Dose Per Fraction: 2 Gy
Plan Total Fractions Prescribed: 35
Plan Total Prescribed Dose: 70 Gy
Reference Point Dosage Given to Date: 14 Gy
Reference Point Session Dosage Given: 2 Gy
Session Number: 7

## 2022-06-27 MED ORDER — SODIUM CHLORIDE 0.9 % IV SOLN
45.0000 mg/m2 | Freq: Once | INTRAVENOUS | Status: AC
  Administered 2022-06-27: 84 mg via INTRAVENOUS
  Filled 2022-06-27: qty 14

## 2022-06-27 MED ORDER — PALONOSETRON HCL INJECTION 0.25 MG/5ML
0.2500 mg | Freq: Once | INTRAVENOUS | Status: AC
  Administered 2022-06-27: 0.25 mg via INTRAVENOUS
  Filled 2022-06-27: qty 5

## 2022-06-27 MED ORDER — SODIUM CHLORIDE 0.9 % IV SOLN
159.2000 mg | Freq: Once | INTRAVENOUS | Status: AC
  Administered 2022-06-27: 160 mg via INTRAVENOUS
  Filled 2022-06-27: qty 16

## 2022-06-27 MED ORDER — SODIUM CHLORIDE 0.9 % IV SOLN
Freq: Once | INTRAVENOUS | Status: AC
  Filled 2022-06-27: qty 250

## 2022-06-27 MED ORDER — SODIUM CHLORIDE 0.9 % IV SOLN
10.0000 mg | Freq: Once | INTRAVENOUS | Status: AC
  Administered 2022-06-27: 10 mg via INTRAVENOUS
  Filled 2022-06-27: qty 1

## 2022-06-27 MED ORDER — FAMOTIDINE IN NACL 20-0.9 MG/50ML-% IV SOLN
20.0000 mg | Freq: Once | INTRAVENOUS | Status: AC
  Administered 2022-06-27: 20 mg via INTRAVENOUS
  Filled 2022-06-27: qty 50

## 2022-06-27 MED ORDER — HEPARIN SOD (PORK) LOCK FLUSH 100 UNIT/ML IV SOLN
INTRAVENOUS | Status: AC
  Administered 2022-06-27: 500 [IU]
  Filled 2022-06-27: qty 5

## 2022-06-27 MED ORDER — DIPHENHYDRAMINE HCL 50 MG/ML IJ SOLN
50.0000 mg | Freq: Once | INTRAMUSCULAR | Status: AC
  Administered 2022-06-27: 50 mg via INTRAVENOUS
  Filled 2022-06-27: qty 1

## 2022-06-27 NOTE — Patient Instructions (Signed)
[facility]CER CTR AT [place]  Discharge Instructions: Thank you for choosing [facility] to provide your oncology and hematology care.  If you have a lab appointment with the Cancer Center, please go directly to the Cancer Center and check in at the registration area.  Wear comfortable clothing and clothing appropriate for easy access to any Portacath or PICC line.   We strive to give you quality time with your provider. You may need to reschedule your appointment if you arrive late (15 or more minutes).  Arriving late affects you and other patients whose appointments are after yours.  Also, if you miss three or more appointments without notifying the office, you may be dismissed from the clinic at the provider's discretion.      For prescription refill requests, have your pharmacy contact our office and allow 72 hours for refills to be completed.    Today you received the following chemotherapy and/or immunotherapy agents Taxol and Carboplatin       To help prevent nausea and vomiting after your treatment, we encourage you to take your nausea medication as directed.  BELOW ARE SYMPTOMS THAT SHOULD BE REPORTED IMMEDIATELY: *FEVER GREATER THAN 100.4 F (38 C) OR HIGHER *CHILLS OR SWEATING *NAUSEA AND VOMITING THAT IS NOT CONTROLLED WITH YOUR NAUSEA MEDICATION *UNUSUAL SHORTNESS OF BREATH *UNUSUAL BRUISING OR BLEEDING *URINARY PROBLEMS (pain or burning when urinating, or frequent urination) *BOWEL PROBLEMS (unusual diarrhea, constipation, pain near the anus) TENDERNESS IN MOUTH AND THROAT WITH OR WITHOUT PRESENCE OF ULCERS (sore throat, sores in mouth, or a toothache) UNUSUAL RASH, SWELLING OR PAIN  UNUSUAL VAGINAL DISCHARGE OR ITCHING   Items with * indicate a potential emergency and should be followed up as soon as possible or go to the Emergency Department if any problems should occur.  Please show the CHEMOTHERAPY ALERT CARD or IMMUNOTHERAPY ALERT CARD at  check-in to the Emergency Department and triage nurse.  Should you have questions after your visit or need to cancel or reschedule your appointment, please contact [facility] [place]  [phone] and follow the prompts.  Office hours are 8:00 a.m. to 4:30 p.m. Monday - Friday. Please note that voicemails left after 4:00 p.m. may not be returned until the following business day.  We are closed weekends and major holidays. You have access to a nurse at all times for urgent questions. Please call the main number to the clinic [phone] and follow the prompts.  For any non-urgent questions, you may also contact your provider using MyChart. We now offer e-Visits for anyone 18 and older to request care online for non-urgent symptoms. For details visit [email or web address].   Also download the MyChart app! Go to the app store, search "MyChart", open the app, select [place], and log in with your MyChart username and password.  Masks are optional in the cancer centers. If you would like for your care team to wear a mask while they are taking care of you, please let them know. For doctor visits, patients may have with them one support person who is at least 85 years old. At this time, visitors are not allowed in the infusion area.  

## 2022-06-30 ENCOUNTER — Other Ambulatory Visit: Payer: Self-pay

## 2022-06-30 ENCOUNTER — Ambulatory Visit
Admission: RE | Admit: 2022-06-30 | Discharge: 2022-06-30 | Disposition: A | Discharge status: Home / Self Care | Payer: Medicare HMO | Source: Ambulatory Visit | Attending: Radiation Oncology | Admitting: Radiation Oncology

## 2022-06-30 DIAGNOSIS — C771 Secondary and unspecified malignant neoplasm of intrathoracic lymph nodes: Secondary | ICD-10-CM | POA: Diagnosis not present

## 2022-06-30 DIAGNOSIS — Z452 Encounter for adjustment and management of vascular access device: Secondary | ICD-10-CM | POA: Diagnosis not present

## 2022-06-30 DIAGNOSIS — Z87891 Personal history of nicotine dependence: Secondary | ICD-10-CM | POA: Diagnosis not present

## 2022-06-30 DIAGNOSIS — Z510 Encounter for antineoplastic radiation therapy: Secondary | ICD-10-CM | POA: Diagnosis not present

## 2022-06-30 DIAGNOSIS — D701 Agranulocytosis secondary to cancer chemotherapy: Secondary | ICD-10-CM | POA: Diagnosis not present

## 2022-06-30 DIAGNOSIS — N281 Cyst of kidney, acquired: Secondary | ICD-10-CM | POA: Diagnosis not present

## 2022-06-30 DIAGNOSIS — C3412 Malignant neoplasm of upper lobe, left bronchus or lung: Secondary | ICD-10-CM | POA: Diagnosis not present

## 2022-06-30 DIAGNOSIS — D6959 Other secondary thrombocytopenia: Secondary | ICD-10-CM | POA: Diagnosis not present

## 2022-06-30 DIAGNOSIS — N400 Enlarged prostate without lower urinary tract symptoms: Secondary | ICD-10-CM | POA: Diagnosis not present

## 2022-06-30 LAB — RAD ONC ARIA SESSION SUMMARY
Course Elapsed Days: 11
Plan Fractions Treated to Date: 8
Plan Prescribed Dose Per Fraction: 2 Gy
Plan Total Fractions Prescribed: 35
Plan Total Prescribed Dose: 70 Gy
Reference Point Dosage Given to Date: 16 Gy
Reference Point Session Dosage Given: 2 Gy
Session Number: 8

## 2022-07-01 ENCOUNTER — Ambulatory Visit
Admission: RE | Admit: 2022-07-01 | Discharge: 2022-07-01 | Disposition: A | Discharge status: Home / Self Care | Payer: Medicare HMO | Source: Ambulatory Visit | Attending: Radiation Oncology | Admitting: Radiation Oncology

## 2022-07-01 ENCOUNTER — Other Ambulatory Visit: Payer: Self-pay

## 2022-07-01 DIAGNOSIS — C3412 Malignant neoplasm of upper lobe, left bronchus or lung: Secondary | ICD-10-CM | POA: Diagnosis not present

## 2022-07-01 DIAGNOSIS — D701 Agranulocytosis secondary to cancer chemotherapy: Secondary | ICD-10-CM | POA: Diagnosis not present

## 2022-07-01 DIAGNOSIS — Z452 Encounter for adjustment and management of vascular access device: Secondary | ICD-10-CM | POA: Diagnosis not present

## 2022-07-01 DIAGNOSIS — C771 Secondary and unspecified malignant neoplasm of intrathoracic lymph nodes: Secondary | ICD-10-CM | POA: Diagnosis not present

## 2022-07-01 DIAGNOSIS — D6959 Other secondary thrombocytopenia: Secondary | ICD-10-CM | POA: Diagnosis not present

## 2022-07-01 DIAGNOSIS — N281 Cyst of kidney, acquired: Secondary | ICD-10-CM | POA: Diagnosis not present

## 2022-07-01 DIAGNOSIS — Z510 Encounter for antineoplastic radiation therapy: Secondary | ICD-10-CM | POA: Diagnosis not present

## 2022-07-01 DIAGNOSIS — N400 Enlarged prostate without lower urinary tract symptoms: Secondary | ICD-10-CM | POA: Diagnosis not present

## 2022-07-01 DIAGNOSIS — Z87891 Personal history of nicotine dependence: Secondary | ICD-10-CM | POA: Diagnosis not present

## 2022-07-01 LAB — RAD ONC ARIA SESSION SUMMARY
Course Elapsed Days: 12
Plan Fractions Treated to Date: 9
Plan Prescribed Dose Per Fraction: 2 Gy
Plan Total Fractions Prescribed: 35
Plan Total Prescribed Dose: 70 Gy
Reference Point Dosage Given to Date: 18 Gy
Reference Point Session Dosage Given: 2 Gy
Session Number: 9

## 2022-07-01 NOTE — Addendum Note (Signed)
Addended by: Althea Charon D on: 07/01/2022 01:24 PM   Modules accepted: Level of Service

## 2022-07-01 NOTE — Addendum Note (Signed)
Addended by: Althea Charon D on: 07/01/2022 01:20 PM   Modules accepted: Level of Service

## 2022-07-02 ENCOUNTER — Other Ambulatory Visit: Payer: Self-pay

## 2022-07-02 ENCOUNTER — Ambulatory Visit
Admission: RE | Admit: 2022-07-02 | Discharge: 2022-07-02 | Disposition: A | Discharge status: Home / Self Care | Payer: Medicare HMO | Source: Ambulatory Visit | Attending: Radiation Oncology | Admitting: Radiation Oncology

## 2022-07-02 DIAGNOSIS — N400 Enlarged prostate without lower urinary tract symptoms: Secondary | ICD-10-CM | POA: Diagnosis not present

## 2022-07-02 DIAGNOSIS — N281 Cyst of kidney, acquired: Secondary | ICD-10-CM | POA: Diagnosis not present

## 2022-07-02 DIAGNOSIS — Z87891 Personal history of nicotine dependence: Secondary | ICD-10-CM | POA: Diagnosis not present

## 2022-07-02 DIAGNOSIS — Z510 Encounter for antineoplastic radiation therapy: Secondary | ICD-10-CM | POA: Diagnosis not present

## 2022-07-02 DIAGNOSIS — C3412 Malignant neoplasm of upper lobe, left bronchus or lung: Secondary | ICD-10-CM | POA: Diagnosis not present

## 2022-07-02 DIAGNOSIS — Z452 Encounter for adjustment and management of vascular access device: Secondary | ICD-10-CM | POA: Diagnosis not present

## 2022-07-02 DIAGNOSIS — D6959 Other secondary thrombocytopenia: Secondary | ICD-10-CM | POA: Diagnosis not present

## 2022-07-02 DIAGNOSIS — C771 Secondary and unspecified malignant neoplasm of intrathoracic lymph nodes: Secondary | ICD-10-CM | POA: Diagnosis not present

## 2022-07-02 DIAGNOSIS — D701 Agranulocytosis secondary to cancer chemotherapy: Secondary | ICD-10-CM | POA: Diagnosis not present

## 2022-07-02 LAB — RAD ONC ARIA SESSION SUMMARY
Course Elapsed Days: 13
Plan Fractions Treated to Date: 10
Plan Prescribed Dose Per Fraction: 2 Gy
Plan Total Fractions Prescribed: 35
Plan Total Prescribed Dose: 70 Gy
Reference Point Dosage Given to Date: 20 Gy
Reference Point Session Dosage Given: 2 Gy
Session Number: 10

## 2022-07-03 ENCOUNTER — Ambulatory Visit
Admission: RE | Admit: 2022-07-03 | Discharge: 2022-07-03 | Disposition: A | Discharge status: Home / Self Care | Payer: Medicare HMO | Source: Ambulatory Visit | Attending: Radiation Oncology | Admitting: Radiation Oncology

## 2022-07-03 ENCOUNTER — Other Ambulatory Visit: Payer: Self-pay

## 2022-07-03 DIAGNOSIS — C3412 Malignant neoplasm of upper lobe, left bronchus or lung: Secondary | ICD-10-CM | POA: Diagnosis not present

## 2022-07-03 DIAGNOSIS — Z452 Encounter for adjustment and management of vascular access device: Secondary | ICD-10-CM | POA: Diagnosis not present

## 2022-07-03 DIAGNOSIS — C771 Secondary and unspecified malignant neoplasm of intrathoracic lymph nodes: Secondary | ICD-10-CM | POA: Diagnosis not present

## 2022-07-03 DIAGNOSIS — D6959 Other secondary thrombocytopenia: Secondary | ICD-10-CM | POA: Diagnosis not present

## 2022-07-03 DIAGNOSIS — N400 Enlarged prostate without lower urinary tract symptoms: Secondary | ICD-10-CM | POA: Diagnosis not present

## 2022-07-03 DIAGNOSIS — Z510 Encounter for antineoplastic radiation therapy: Secondary | ICD-10-CM | POA: Diagnosis not present

## 2022-07-03 DIAGNOSIS — N281 Cyst of kidney, acquired: Secondary | ICD-10-CM | POA: Diagnosis not present

## 2022-07-03 DIAGNOSIS — D701 Agranulocytosis secondary to cancer chemotherapy: Secondary | ICD-10-CM | POA: Diagnosis not present

## 2022-07-03 DIAGNOSIS — Z87891 Personal history of nicotine dependence: Secondary | ICD-10-CM | POA: Diagnosis not present

## 2022-07-03 LAB — RAD ONC ARIA SESSION SUMMARY
Course Elapsed Days: 14
Plan Fractions Treated to Date: 11
Plan Prescribed Dose Per Fraction: 2 Gy
Plan Total Fractions Prescribed: 35
Plan Total Prescribed Dose: 70 Gy
Reference Point Dosage Given to Date: 22 Gy
Reference Point Session Dosage Given: 2 Gy
Session Number: 11

## 2022-07-03 MED FILL — Dexamethasone Sodium Phosphate Inj 100 MG/10ML: INTRAMUSCULAR | Qty: 1 | Status: AC

## 2022-07-04 ENCOUNTER — Inpatient Hospital Stay (HOSPITAL_BASED_OUTPATIENT_CLINIC_OR_DEPARTMENT_OTHER): Payer: Medicare HMO | Admitting: Oncology

## 2022-07-04 ENCOUNTER — Other Ambulatory Visit: Payer: Self-pay

## 2022-07-04 ENCOUNTER — Encounter: Payer: Self-pay | Admitting: Oncology

## 2022-07-04 ENCOUNTER — Inpatient Hospital Stay: Payer: Medicare HMO

## 2022-07-04 ENCOUNTER — Ambulatory Visit
Admission: RE | Admit: 2022-07-04 | Discharge: 2022-07-04 | Disposition: A | Discharge status: Home / Self Care | Payer: Medicare HMO | Source: Ambulatory Visit | Attending: Radiation Oncology | Admitting: Radiation Oncology

## 2022-07-04 ENCOUNTER — Encounter: Payer: Self-pay | Admitting: *Deleted

## 2022-07-04 VITALS — BP 134/73 | HR 54 | Temp 98.3°F | Resp 18 | Wt 157.8 lb

## 2022-07-04 DIAGNOSIS — Z510 Encounter for antineoplastic radiation therapy: Secondary | ICD-10-CM | POA: Diagnosis not present

## 2022-07-04 DIAGNOSIS — C771 Secondary and unspecified malignant neoplasm of intrathoracic lymph nodes: Secondary | ICD-10-CM | POA: Diagnosis not present

## 2022-07-04 DIAGNOSIS — D701 Agranulocytosis secondary to cancer chemotherapy: Secondary | ICD-10-CM | POA: Diagnosis not present

## 2022-07-04 DIAGNOSIS — C3412 Malignant neoplasm of upper lobe, left bronchus or lung: Secondary | ICD-10-CM | POA: Diagnosis not present

## 2022-07-04 DIAGNOSIS — T451X5A Adverse effect of antineoplastic and immunosuppressive drugs, initial encounter: Secondary | ICD-10-CM | POA: Diagnosis not present

## 2022-07-04 DIAGNOSIS — N281 Cyst of kidney, acquired: Secondary | ICD-10-CM | POA: Diagnosis not present

## 2022-07-04 DIAGNOSIS — Z87891 Personal history of nicotine dependence: Secondary | ICD-10-CM | POA: Diagnosis not present

## 2022-07-04 DIAGNOSIS — Z5111 Encounter for antineoplastic chemotherapy: Secondary | ICD-10-CM | POA: Diagnosis not present

## 2022-07-04 DIAGNOSIS — D6959 Other secondary thrombocytopenia: Secondary | ICD-10-CM | POA: Diagnosis not present

## 2022-07-04 DIAGNOSIS — Z452 Encounter for adjustment and management of vascular access device: Secondary | ICD-10-CM | POA: Diagnosis not present

## 2022-07-04 DIAGNOSIS — N400 Enlarged prostate without lower urinary tract symptoms: Secondary | ICD-10-CM | POA: Diagnosis not present

## 2022-07-04 LAB — RAD ONC ARIA SESSION SUMMARY
Course Elapsed Days: 15
Plan Fractions Treated to Date: 12
Plan Prescribed Dose Per Fraction: 2 Gy
Plan Total Fractions Prescribed: 35
Plan Total Prescribed Dose: 70 Gy
Reference Point Dosage Given to Date: 24 Gy
Reference Point Session Dosage Given: 2 Gy
Session Number: 12

## 2022-07-04 LAB — CBC WITH DIFFERENTIAL/PLATELET
Abs Immature Granulocytes: 0.01 10*3/uL (ref 0.00–0.07)
Basophils Absolute: 0 10*3/uL (ref 0.0–0.1)
Basophils Relative: 1 %
Eosinophils Absolute: 0 10*3/uL (ref 0.0–0.5)
Eosinophils Relative: 2 %
HCT: 30.8 % — ABNORMAL LOW (ref 39.0–52.0)
Hemoglobin: 10.2 g/dL — ABNORMAL LOW (ref 13.0–17.0)
Immature Granulocytes: 1 %
Lymphocytes Relative: 26 %
Lymphs Abs: 0.6 10*3/uL — ABNORMAL LOW (ref 0.7–4.0)
MCH: 32 pg (ref 26.0–34.0)
MCHC: 33.1 g/dL (ref 30.0–36.0)
MCV: 96.6 fL (ref 80.0–100.0)
Monocytes Absolute: 0.3 10*3/uL (ref 0.1–1.0)
Monocytes Relative: 11 %
Neutro Abs: 1.3 10*3/uL — ABNORMAL LOW (ref 1.7–7.7)
Neutrophils Relative %: 59 %
Platelets: 127 10*3/uL — ABNORMAL LOW (ref 150–400)
RBC: 3.19 MIL/uL — ABNORMAL LOW (ref 4.22–5.81)
RDW: 14.7 % (ref 11.5–15.5)
WBC: 2.2 10*3/uL — ABNORMAL LOW (ref 4.0–10.5)
nRBC: 0 % (ref 0.0–0.2)

## 2022-07-04 LAB — COMPREHENSIVE METABOLIC PANEL
ALT: 13 U/L (ref 0–44)
AST: 18 U/L (ref 15–41)
Albumin: 3.2 g/dL — ABNORMAL LOW (ref 3.5–5.0)
Alkaline Phosphatase: 65 U/L (ref 38–126)
Anion gap: 6 (ref 5–15)
BUN: 26 mg/dL — ABNORMAL HIGH (ref 8–23)
CO2: 24 mmol/L (ref 22–32)
Calcium: 8.5 mg/dL — ABNORMAL LOW (ref 8.9–10.3)
Chloride: 106 mmol/L (ref 98–111)
Creatinine, Ser: 1.02 mg/dL (ref 0.61–1.24)
GFR, Estimated: >60 mL/min (ref >60)
Glucose, Bld: 80 mg/dL (ref 70–99)
Potassium: 4.2 mmol/L (ref 3.5–5.1)
Sodium: 136 mmol/L (ref 135–145)
Total Bilirubin: 0.9 mg/dL (ref 0.3–1.2)
Total Protein: 5.8 g/dL — ABNORMAL LOW (ref 6.5–8.1)

## 2022-07-04 MED ORDER — SODIUM CHLORIDE 0.9 % IV SOLN
Freq: Once | INTRAVENOUS | Status: AC
  Filled 2022-07-04: qty 250

## 2022-07-04 MED ORDER — PALONOSETRON HCL INJECTION 0.25 MG/5ML
0.2500 mg | Freq: Once | INTRAVENOUS | Status: AC
  Administered 2022-07-04: 0.25 mg via INTRAVENOUS
  Filled 2022-07-04: qty 5

## 2022-07-04 MED ORDER — SODIUM CHLORIDE 0.9 % IV SOLN
10.0000 mg | Freq: Once | INTRAVENOUS | Status: AC
  Administered 2022-07-04: 10 mg via INTRAVENOUS
  Filled 2022-07-04: qty 10

## 2022-07-04 MED ORDER — SODIUM CHLORIDE 0.9 % IV SOLN
117.7500 mg | Freq: Once | INTRAVENOUS | Status: AC
  Administered 2022-07-04: 120 mg via INTRAVENOUS
  Filled 2022-07-04: qty 12

## 2022-07-04 MED ORDER — FAMOTIDINE IN NACL 20-0.9 MG/50ML-% IV SOLN
20.0000 mg | Freq: Once | INTRAVENOUS | Status: AC
  Administered 2022-07-04: 20 mg via INTRAVENOUS
  Filled 2022-07-04: qty 50

## 2022-07-04 MED ORDER — HEPARIN SOD (PORK) LOCK FLUSH 100 UNIT/ML IV SOLN
500.0000 [IU] | Freq: Once | INTRAVENOUS | Status: AC | PRN
  Administered 2022-07-04: 500 [IU]
  Filled 2022-07-04: qty 5

## 2022-07-04 MED ORDER — DIPHENHYDRAMINE HCL 50 MG/ML IJ SOLN
50.0000 mg | Freq: Once | INTRAMUSCULAR | Status: AC
  Administered 2022-07-04: 50 mg via INTRAVENOUS
  Filled 2022-07-04: qty 1

## 2022-07-04 MED ORDER — SODIUM CHLORIDE 0.9 % IV SOLN
45.0000 mg/m2 | Freq: Once | INTRAVENOUS | Status: AC
  Administered 2022-07-04: 84 mg via INTRAVENOUS
  Filled 2022-07-04: qty 14

## 2022-07-04 MED ORDER — SODIUM CHLORIDE 0.9% FLUSH
10.0000 mL | Freq: Once | INTRAVENOUS | Status: AC
  Administered 2022-07-04: 10 mL via INTRAVENOUS
  Filled 2022-07-04: qty 10

## 2022-07-04 NOTE — Patient Instructions (Signed)
[facility] CANCER CTR AT [place]  Discharge Instructions: Thank you for choosing [place] to provide your oncology and hematology care.  If you have a lab appointment with the [place], please go directly to the [place] and check in at the registration area.  Wear comfortable clothing and clothing appropriate for easy access to any Portacath or PICC line.   We strive to give you quality time with your provider. You may need to reschedule your appointment if you arrive late (15 or more minutes).  Arriving late affects you and other patients whose appointments are after yours.  Also, if you miss three or more appointments without notifying the office, you may be dismissed from the clinic at the provider's discretion.      For prescription refill requests, have your pharmacy contact our office and allow 72 hours for refills to be completed.    Today you received the following chemotherapy and/or immunotherapy agents Paraplatin & Taxol      To help prevent nausea and vomiting after your treatment, we encourage you to take your nausea medication as directed.  BELOW ARE SYMPTOMS THAT SHOULD BE REPORTED IMMEDIATELY: *FEVER GREATER THAN 100.4 F (38 C) OR HIGHER *CHILLS OR SWEATING *NAUSEA AND VOMITING THAT IS NOT CONTROLLED WITH YOUR NAUSEA MEDICATION *UNUSUAL SHORTNESS OF BREATH *UNUSUAL BRUISING OR BLEEDING *URINARY PROBLEMS (pain or burning when urinating, or frequent urination) *BOWEL PROBLEMS (unusual diarrhea, constipation, pain near the anus) TENDERNESS IN MOUTH AND THROAT WITH OR WITHOUT PRESENCE OF ULCERS (sore throat, sores in mouth, or a toothache) UNUSUAL RASH, SWELLING OR PAIN  UNUSUAL VAGINAL DISCHARGE OR ITCHING   Items with * indicate a potential emergency and should be followed up as soon as possible or go to the Emergency Department if any problems should occur.  Please show the CHEMOTHERAPY ALERT CARD or IMMUNOTHERAPY ALERT CARD at  check-in to the Emergency Department and triage nurse.  Should you have questions after your visit or need to cancel or reschedule your appointment, please contact [facility] CANCER [place] AT [place]  [phone] and follow the prompts.  Office hours are 8:00 a.m. to 4:30 p.m. Monday - Friday. Please note that voicemails left after 4:00 p.m. may not be returned until the following business day.  We are closed weekends and major holidays. You have access to a nurse at all times for urgent questions. Please call the main number to the clinic [phone] and follow the prompts.  For any non-urgent questions, you may also contact your provider using MyChart. We now offer e-Visits for anyone 85 and older to request care online for non-urgent symptoms. For details visit [email or web address].   Also download the MyChart app! Go to the app store, search "MyChart", open the app, select [place], and log in with your MyChart username and password.  Masks are optional in the cancer centers. If you would like for your care team to wear a mask while they are taking care of you, please let them know. For doctor visits, patients may have with them one support person who is at least 85 years old. At this time, visitors are not allowed in the infusion area.

## 2022-07-04 NOTE — Progress Notes (Signed)
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  He was found to have a left upper lobe lung mass measuring 5.5 x 3.7 x 2.8 cm.  It was confluent with the left hilar lymph node.  Enlarged AP window lymph node measuring 16 mm.  Enlarged left paratracheal lymph node.  Low-attenuation 7 mm lesion in the liver.  7 mm cyst in the uncinate process of the pancreas for which follow-up was not recommended.  Bilateral kidney cysts.  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  Prostatic enlargement.   PET CT is can showed hypermetabolic left upper lobe lung mass measuring 5.4 x 3.9 cm with an SUV of 15.1.  Ipsilateral mediastinal lymph node metastases with index lymph node measuring 1.7 with an SUV of 9.8.  No evidence of distant metastatic disease.  Biopsy of the left upper lobe as well as station 4R lymph node was consistent with squamous cell carcinoma   Concurrent chemoradiation with weekly CarboTaxol was recommended followed by consideration for maintenance durvalumab.NGS  testing did not show any actionable mutations.  PD-L1 35%.  MSI stable.  MRI brain was negative for metastatic disease    Interval history-tolerating chemoradiation well so far.  He does report some pain during swallowing for which she was started on sucralfate by Dr. Baruch Gouty.  ECOG PS- 1 Pain scale- 0  Review of systems- Review of Systems  Constitutional:  Negative for chills, fever, malaise/fatigue and weight loss.  HENT:  Negative for congestion, ear discharge and nosebleeds.   Eyes:  Negative for blurred vision.  Respiratory:  Negative for cough, hemoptysis, sputum production, shortness of breath and wheezing.   Cardiovascular:  Negative for chest pain, palpitations, orthopnea and claudication.  Gastrointestinal:  Negative for abdominal pain, blood in stool, constipation, diarrhea, heartburn, melena, nausea and vomiting.  Genitourinary:  Negative for dysuria, flank pain, frequency, hematuria and urgency.  Musculoskeletal:  Negative for back pain, joint pain and myalgias.  Skin:  Negative for rash.  Neurological:  Negative for dizziness, tingling, focal weakness, seizures, weakness and headaches.  Endo/Heme/Allergies:  Does not bruise/bleed easily.  Psychiatric/Behavioral:  Negative for depression and suicidal ideas. The patient does not have insomnia.       Allergies  Allergen Reactions   Cefdinir Nausea Only    hypersensitivity to smell   Doxycycline     Sun sensitivity   Prednisone     Hallucinations    Sertraline Other (See Comments)    Hallucinations      Past Medical History:  Diagnosis Date   A-fib ([place])    a.) CHA2DS2-VASc = 6 (age x2, HTN, DVT x2, vascular disease history). b.) rate/rhythm maintained on oral carvedilol;  chronically anticoagulated using daily warfarin.   Actinic keratosis    Anemia    Aortic atherosclerosis (HCC)    Bilateral renal cysts    a.)  CT abdomen pelvis 04/24/2022: Enhancing septation of the lateral aspect of the mid RIGHT kidney  measuring 2.7 x 2.8 cm concerning for cystic renal neoplasm   Bladder calculi    BPH (benign prostatic hyperplasia)    DDD (degenerative disc disease), lumbar    a.) s/p LEFT laminectomy L5   Diverticulosis    DOE (dyspnea on exertion)    DVT (deep venous thrombosis) (HCC)    Elevated PSA    GERD (gastroesophageal reflux disease)    H/O degenerative disc disease    HLD (hyperlipidemia)    Hypertension    Lipoma of arm    Lipomatosis    Long term current use of anticoagulant    a.) warfarin   Lung cancer ([place])    Mass of upper lobe of left lung 04/24/2022   a.)  CT chest 04/24/2022: Multilobulated perihilar mass measuring 5.3 x 3.7 x 2.8 cm with associated LEFT hilar and mediastinal LAD; b.)  PET CT 06/21/2535: Hypermetabolic LEFT upper lobe lung mass (max SUV 15.1); ipsilateral nodal metastasis --> presuming a NSC histology, 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   NICM (nonischemic cardiomyopathy) ([place]) 11/22/2015   a.) TTE 11/22/2015: EF 40%. b.) TTE 12/27/2018: EF 45%. c.) TTE 08/30/2020: EF 45-50%.   Night sweats    Obesity    Osteoarthritis    RBBB (right bundle branch block)    Right inguinal hernia    VHD (valvular heart disease) 11/22/2015   a.) TTE 11/22/2015: EF 40%; mod MR, mod-sev TR, triv PR; mod BAE, mild RV enlargement. b.) TTE 12/27/2018: EF 45%; mod MR, mod-sev TR, triv PR; mod BAE, mild RV enlargement. c.) TTE 08/30/2020: EF 45-50%; mild MR/TR.     Past Surgical History:  Procedure Laterality Date   Bilateral Radical Keratotomy Bilateral 1997   COLONOSCOPY WITH PROPOFOL N/A 01/11/2016   Procedure: COLONOSCOPY WITH PROPOFOL;  Surgeon: Manya Silvas, MD;  Location: [facility] ENDOSCOPY;  Service: Endoscopy;  Laterality: N/A; repeat 3 years, tubular adenoma   DENTAL SURGERY     Patient had implants   FLEXIBLE BRONCHOSCOPY Left 05/19/2022   Procedure: FLEXIBLE BRONCHOSCOPY;  Surgeon: Tyler Pita, MD;  Location: [facility]  ORS;  Service: Cardiopulmonary;  Laterality: Left;   GUM SURGERY     IR IMAGING GUIDED PORT INSERTION  06/13/2022   L4 - S1 decompression Left    PROSTATE BIOPSY     TONSILLECTOMY AND ADENOIDECTOMY  age 74   VIDEO BRONCHOSCOPY WITH ENDOBRONCHIAL ULTRASOUND Left 05/19/2022   Procedure: VIDEO BRONCHOSCOPY WITH ENDOBRONCHIAL ULTRASOUND;  Surgeon: Tyler Pita, MD;  Location: [facility] ORS;  Service: Cardiopulmonary;  Laterality: Left;   XI ROBOTIC ASSISTED INGUINAL HERNIA REPAIR WITH MESH Right 10/21/2021   Procedure: XI ROBOTIC ASSISTED INGUINAL HERNIA REPAIR WITH MESH;  Surgeon: Ronny Bacon, MD;  Location: [facility] ORS;  Service: General;  Laterality: Right;    Social History   Socioeconomic History   Marital status: Married    Spouse name: Doris   Number of children: 3   Years of education: Not on file   Highest education level: Associate degree: occupational, Hotel manager, or vocational program  Occupational History   Occupation: retired  Tobacco Use   Smoking status: Former    Types: Cigarettes    Quit date: 08/26/1971  Years since quitting: 50.8    Passive exposure: Never   Smokeless tobacco: Never   Tobacco comments:    Smoked for about 20 years.  Vaping Use   Vaping Use: Never used  Substance and Sexual Activity   Alcohol use: Not Currently   Drug use: No   Sexual activity: Not Currently  Other Topics Concern   Not on file  Social History Narrative   Not on file   Social Determinants of Health   Financial Resource Strain: Low Risk  (12/18/2021)   Overall Financial Resource Strain (CARDIA)    Difficulty of Paying Living Expenses: Not hard at all  Food Insecurity: No Food Insecurity (12/18/2021)   Hunger Vital Sign    Worried About Running Out of Food in the Last Year: Never true    Ran Out of Food in the Last Year: Never true  Transportation Needs: No Transportation Needs (12/18/2021)   PRAPARE - Hydrologist (Medical): No    Lack of  Transportation (Non-Medical): No  Physical Activity: Sufficiently Active (12/18/2021)   Exercise Vital Sign    Days of Exercise per Week: 7 days    Minutes of Exercise per Session: 50 min  Stress: No Stress Concern Present (12/18/2021)   [place]    Feeling of Stress : Only a little  Social Connections: Moderately Integrated (12/18/2021)   Social Connection and Isolation Panel [NHANES]    Frequency of Communication with Friends and Family: Twice a week    Frequency of Social Gatherings with Friends and Family: Once a week    Attends Religious Services: More than 4 times per year    Active Member of [employer] or Organizations: No    Attends Archivist Meetings: Never    Marital Status: Married  Human resources officer Violence: Not At Risk (12/18/2021)   Humiliation, Afraid, Rape, and Kick questionnaire    Fear of Current or Ex-Partner: No    Emotionally Abused: No    Physically Abused: No    Sexually Abused: No    Family History  Problem Relation Age of Onset   Cancer Mother        secondary to cancinoma of the jaw from her dipping snuff.   Heart attack Father    CAD Father    Hypertension Sister    Diabetes Son        Type I diabetes   Prostate cancer Neg Hx    Kidney cancer Neg Hx      Current Outpatient Medications:    amLODipine (NORVASC) 5 MG tablet, TAKE 1 TABLET EVERY DAY, Disp: 90 tablet, Rfl: 3   benazepril (LOTENSIN) 40 MG tablet, TAKE 1 TABLET EVERY DAY, Disp: 90 tablet, Rfl: 3   carvedilol (COREG) 6.25 MG tablet, TAKE 1 TABLET TWICE DAILY, Disp: 180 tablet, Rfl: 3   Cyanocobalamin (VITAMIN B 12 PO), Take 1 capsule by mouth 2 (two) times daily. 1000 mcg each capsule, Disp: , Rfl:    doxazosin (CARDURA) 8 MG tablet, TAKE 1 TABLET EVERY DAY, Disp: 90 tablet, Rfl: 3   finasteride (PROSCAR) 5 MG tablet, Take 1 tablet (5 mg total) by mouth daily., Disp: 90 tablet, Rfl: 3   gabapentin (NEURONTIN) 300 MG  capsule, TAKE 1 CAPSULE EVERY MORNING, 1 CAPSULE IN THE AFTERNOON AND 2 CAPSULES AT BEDTIME, Disp: 360 capsule, Rfl: 3   lidocaine-prilocaine (EMLA) cream, Apply to affected area once, Disp: 30 g, Rfl: 3  Magnesium 500 MG TABS, Take 500 mg by mouth daily., Disp: , Rfl:    Multiple Vitamin (MULTI-VITAMIN) tablet, Take 1 tablet by mouth daily. , Disp: , Rfl:    oxyCODONE (OXY IR/ROXICODONE) 5 MG immediate release tablet, Take by mouth., Disp: , Rfl:    triamterene-hydrochlorothiazide (MAXZIDE-25) 37.5-25 MG tablet, TAKE 1/2 TABLET EVERY DAY, Disp: 45 tablet, Rfl: 0   warfarin (COUMADIN) 4 MG tablet, TAKE 1 TABLET EVERY DAY IN THE AFTERNOON (Patient taking differently: 5 mg,  Mon, Weds, Fri, 4 mg the rest of the week), Disp: 90 tablet, Rfl: 0   dexamethasone (DECADRON) 4 MG tablet, Take 2 tablets daily for 2 days, start the day after chemotherapy. Take with food. (Patient not taking: Reported on 06/20/2022), Disp: 30 tablet, Rfl: 1   ondansetron (ZOFRAN) 8 MG tablet, Take 1 tablet (8 mg total) by mouth every 8 (eight) hours as needed for nausea or vomiting. Start on the third day after chemotherapy. (Patient not taking: Reported on 06/20/2022), Disp: 30 tablet, Rfl: 1   prochlorperazine (COMPAZINE) 10 MG tablet, Take 1 tablet (10 mg total) by mouth every 6 (six) hours as needed for nausea or vomiting. (Patient not taking: Reported on 07/04/2022), Disp: 30 tablet, Rfl: 1 No current facility-administered medications for this visit.  Facility-Administered Medications Ordered in Other Visits:    CARBOplatin (PARAPLATIN) 120 mg in sodium chloride 0.9 % 100 mL chemo infusion, 120 mg, Intravenous, Once, Sindy Guadeloupe, MD   dexamethasone (DECADRON) 10 mg in sodium chloride 0.9 % 50 mL IVPB, 10 mg, Intravenous, Once, Sindy Guadeloupe, MD, Last Rate: 204 mL/hr at 07/04/22 1156, 10 mg at 07/04/22 1156   heparin lock flush 100 unit/mL, 500 Units, Intracatheter, Once PRN, Sindy Guadeloupe, MD   PACLitaxel (TAXOL) 84  mg in sodium chloride 0.9 % 250 mL chemo infusion (</= 13m/m2), 45 mg/m2 (Treatment Plan Recorded), Intravenous, Once, RSindy Guadeloupe MD  Physical exam:  Vitals:   07/04/22 1057  BP: 134/73  Pulse: (!) 54  Resp: 18  Temp: 98.3 F (36.8 C)  SpO2: 100%  Weight: 157 lb 12.8 oz (71.6 kg)   Physical Exam Constitutional:      General: He is not in acute distress. Cardiovascular:     Rate and Rhythm: Normal rate and regular rhythm.     Heart sounds: Normal heart sounds.  Pulmonary:     Effort: Pulmonary effort is normal.     Breath sounds: Normal breath sounds.  Abdominal:     General: Bowel sounds are normal.     Palpations: Abdomen is soft.  Skin:    General: Skin is warm and dry.  Neurological:     Mental Status: He is alert and oriented to person, place, and time.         Latest Ref Rng & Units 07/04/2022   10:47 AM  CMP  Glucose 70 - 99 mg/dL 80   BUN 8 - 23 mg/dL 26   Creatinine 0.61 - 1.24 mg/dL 1.02   Sodium 135 - 145 mmol/L 136   Potassium 3.5 - 5.1 mmol/L 4.2   Chloride 98 - 111 mmol/L 106   CO2 22 - 32 mmol/L 24   Calcium 8.9 - 10.3 mg/dL 8.5   Total Protein 6.5 - 8.1 g/dL 5.8   Total Bilirubin 0.3 - 1.2 mg/dL 0.9   Alkaline Phos 38 - 126 U/L 65   AST 15 - 41 U/L 18   ALT 0 - 44 U/L 13  Latest Ref Rng & Units 07/04/2022   10:47 AM  CBC  WBC 4.0 - 10.5 K/uL 2.2   Hemoglobin 13.0 - 17.0 g/dL 10.2   Hematocrit 39.0 - 52.0 % 30.8   Platelets 150 - 400 K/uL 127     No images are attached to the encounter.  IR IMAGING GUIDED PORT INSERTION  Result Date: 06/13/2022 INDICATION: Left upper lobe lung cancer. Patient requires durable venous access to allow for chemotherapy. EXAM: IMPLANTED PORT A CATH PLACEMENT WITH ULTRASOUND AND FLUOROSCOPIC GUIDANCE MEDICATIONS: None. ANESTHESIA/SEDATION: Versed 1 mg IV; Fentanyl 50 mcg IV; Moderate Sedation Time:  18 minutes The patient's vital signs and level of consciousness were continuously monitored during the  procedure by the interventional radiology nurse under my direct supervision. FLUOROSCOPY: Radiation exposure index: 1.5 mGy reference air kerma COMPLICATIONS: None immediate. PROCEDURE: The right neck and chest was prepped with chlorhexidine, and draped in the usual sterile fashion using maximum barrier technique (cap and mask, sterile gown, sterile gloves, large sterile sheet, hand hygiene and cutaneous antiseptic). Local anesthesia was attained by infiltration with 1% lidocaine with epinephrine. Ultrasound demonstrated patency of the right internal jugular vein, and this was documented with an image. Under real-time ultrasound guidance, this vein was accessed with a 21 gauge micropuncture needle and image documentation was performed. A small dermatotomy was made at the access site with an 11 scalpel. A 0.018" wire was advanced into the SVC and the access needle exchanged for a 14F micropuncture vascular sheath. The 0.018" wire was then removed and a 0.035" wire advanced into the IVC. An appropriate location for the subcutaneous reservoir was selected below the clavicle and an incision was made through the skin and underlying soft tissues. The subcutaneous tissues were then dissected using a combination of blunt and sharp surgical technique and a pocket was formed. A single lumen power injectable portacatheter was then tunneled through the subcutaneous tissues from the pocket to the dermatotomy and the port reservoir placed within the subcutaneous pocket. The venous access site was then serially dilated and a peel away vascular sheath placed over the wire. The wire was removed and the port catheter advanced into position under fluoroscopic guidance. The catheter tip is positioned in the superior cavoatrial junction. This was documented with a spot image. The portacatheter was then tested and found to flush and aspirate well. The port was flushed with saline followed by 100 units/mL heparinized saline. The pocket was  then closed in two layers using first subdermal inverted interrupted absorbable sutures followed by a running subcuticular suture. The epidermis was then sealed with Dermabond. The dermatotomy at the venous access site was also closed with Dermabond. IMPRESSION: Successful placement of a right IJ approach Power Port with ultrasound and fluoroscopic guidance. The catheter is ready for use. Electronically Signed   By: Jacqulynn Cadet M.D.   On: 06/13/2022 16:58     Assessment and plan- Patient is a 87 y.o. male  with stage IIIb squamous cell carcinoma of the left upper lobe T3 N2 M0.  He is here for on treatment assessment prior to cycle 3 of weekly CarboTaxol chemotherapy  Counts okay to proceed with cycle 3 of weekly CarboTaxol chemotherapy today.  He will directly proceed with cycle 4 next week and I will see him back in 2 weeks for cycle 5 which will be given on a Monday instead of Friday due to Thanksgiving week.  Plan is to do 7 total cycles followed by repeat scans.  His white  cell count is drifted down to 2.2 with an [place] of 1.3 today.  He will therefore be getting Neupogen on 3 consecutive days on 1113, 11/14 and 07/09/2022.   Visit Diagnosis 1. Encounter for antineoplastic chemotherapy   2. Primary malignant neoplasm of left upper lobe of lung ([place])   3. Chemotherapy induced neutropenia (HCC)      Dr. Randa Evens, MD, MPH [facility] [phone] 07/04/2022 12:09 PM

## 2022-07-04 NOTE — Patient Instructions (Signed)
-  You're white blood count (WBC) is low from the chemotherapy treatment. Dr. Janese Banks is recommending that you wear a mask when out in public or at the [place] to keep you from getting sick. -We are going to schedule you for an injection on Monday, Tuesday, and Wednesday next week. This injection will help your white blood count come back to normal.

## 2022-07-04 NOTE — Progress Notes (Signed)
Met with patient during follow up visit. All questions answered during visit. Informed will be given print out of appts prior to leaving today. Instructed pt to call with any questions or needs. Pt verbalized understanding.

## 2022-07-07 ENCOUNTER — Other Ambulatory Visit: Payer: Self-pay

## 2022-07-07 ENCOUNTER — Ambulatory Visit
Admission: RE | Admit: 2022-07-07 | Discharge: 2022-07-07 | Disposition: A | Discharge status: Home / Self Care | Payer: Medicare HMO | Source: Ambulatory Visit | Attending: Radiation Oncology | Admitting: Radiation Oncology

## 2022-07-07 DIAGNOSIS — C3412 Malignant neoplasm of upper lobe, left bronchus or lung: Secondary | ICD-10-CM | POA: Diagnosis not present

## 2022-07-07 DIAGNOSIS — N281 Cyst of kidney, acquired: Secondary | ICD-10-CM | POA: Diagnosis not present

## 2022-07-07 DIAGNOSIS — N400 Enlarged prostate without lower urinary tract symptoms: Secondary | ICD-10-CM | POA: Diagnosis not present

## 2022-07-07 DIAGNOSIS — D701 Agranulocytosis secondary to cancer chemotherapy: Secondary | ICD-10-CM | POA: Diagnosis not present

## 2022-07-07 DIAGNOSIS — Z510 Encounter for antineoplastic radiation therapy: Secondary | ICD-10-CM | POA: Diagnosis not present

## 2022-07-07 DIAGNOSIS — C771 Secondary and unspecified malignant neoplasm of intrathoracic lymph nodes: Secondary | ICD-10-CM | POA: Diagnosis not present

## 2022-07-07 DIAGNOSIS — Z87891 Personal history of nicotine dependence: Secondary | ICD-10-CM | POA: Diagnosis not present

## 2022-07-07 DIAGNOSIS — Z452 Encounter for adjustment and management of vascular access device: Secondary | ICD-10-CM | POA: Diagnosis not present

## 2022-07-07 DIAGNOSIS — D6959 Other secondary thrombocytopenia: Secondary | ICD-10-CM | POA: Diagnosis not present

## 2022-07-07 LAB — RAD ONC ARIA SESSION SUMMARY
Course Elapsed Days: 18
Plan Fractions Treated to Date: 13
Plan Prescribed Dose Per Fraction: 2 Gy
Plan Total Fractions Prescribed: 35
Plan Total Prescribed Dose: 70 Gy
Reference Point Dosage Given to Date: 26 Gy
Reference Point Session Dosage Given: 2 Gy
Session Number: 13

## 2022-07-08 ENCOUNTER — Ambulatory Visit
Admission: RE | Admit: 2022-07-08 | Discharge: 2022-07-08 | Disposition: A | Discharge status: Home / Self Care | Payer: Medicare HMO | Source: Ambulatory Visit | Attending: Radiation Oncology | Admitting: Radiation Oncology

## 2022-07-08 ENCOUNTER — Other Ambulatory Visit: Payer: Self-pay

## 2022-07-08 ENCOUNTER — Other Ambulatory Visit: Payer: Self-pay | Admitting: Family Medicine

## 2022-07-08 DIAGNOSIS — Z87891 Personal history of nicotine dependence: Secondary | ICD-10-CM | POA: Diagnosis not present

## 2022-07-08 DIAGNOSIS — C3412 Malignant neoplasm of upper lobe, left bronchus or lung: Secondary | ICD-10-CM | POA: Diagnosis not present

## 2022-07-08 DIAGNOSIS — D6959 Other secondary thrombocytopenia: Secondary | ICD-10-CM | POA: Diagnosis not present

## 2022-07-08 DIAGNOSIS — N400 Enlarged prostate without lower urinary tract symptoms: Secondary | ICD-10-CM | POA: Diagnosis not present

## 2022-07-08 DIAGNOSIS — Z452 Encounter for adjustment and management of vascular access device: Secondary | ICD-10-CM | POA: Diagnosis not present

## 2022-07-08 DIAGNOSIS — Z510 Encounter for antineoplastic radiation therapy: Secondary | ICD-10-CM | POA: Diagnosis not present

## 2022-07-08 DIAGNOSIS — N281 Cyst of kidney, acquired: Secondary | ICD-10-CM | POA: Diagnosis not present

## 2022-07-08 DIAGNOSIS — N138 Other obstructive and reflux uropathy: Secondary | ICD-10-CM

## 2022-07-08 DIAGNOSIS — D701 Agranulocytosis secondary to cancer chemotherapy: Secondary | ICD-10-CM | POA: Diagnosis not present

## 2022-07-08 DIAGNOSIS — C771 Secondary and unspecified malignant neoplasm of intrathoracic lymph nodes: Secondary | ICD-10-CM | POA: Diagnosis not present

## 2022-07-08 LAB — RAD ONC ARIA SESSION SUMMARY
Course Elapsed Days: 19
Plan Fractions Treated to Date: 14
Plan Prescribed Dose Per Fraction: 2 Gy
Plan Total Fractions Prescribed: 35
Plan Total Prescribed Dose: 70 Gy
Reference Point Dosage Given to Date: 28 Gy
Reference Point Session Dosage Given: 2 Gy
Session Number: 14

## 2022-07-08 MED ORDER — AMLODIPINE BESYLATE 5 MG PO TABS: 5.0000 mg | ORAL_TABLET | Freq: Every day | ORAL | 0 refills | Status: DC

## 2022-07-08 MED ORDER — DOXAZOSIN MESYLATE 8 MG PO TABS: 8.0000 mg | ORAL_TABLET | Freq: Every day | ORAL | 0 refills | Status: DC

## 2022-07-08 MED ORDER — CARVEDILOL 6.25 MG PO TABS: 6.2500 mg | ORAL_TABLET | Freq: Two times a day (BID) | ORAL | 0 refills | Status: DC

## 2022-07-08 NOTE — Telephone Encounter (Signed)
Patient called, left VM to return the call to the office. Let patient know that finasteride is the medication for his prostate and that he has refills at [facility] that was sent on 04/26/22 a 90 day supply for 1 year of refills. He will need to call [facility] to request that refill.

## 2022-07-08 NOTE — Telephone Encounter (Signed)
[facility] fax refill request on the following medication  carvedilol (COREG) 6.25 MG tablet Qty:180 with R:11 unless otherwise noted  amLODipine (NORVASC) 5 MG tablet  qty:90 R:11 unless otherwise noted  doxazosin (CARDURA) 8 MG tablet  Qty:90 R:11 un;ess otherwise noted.  Left VM to return call to the office to scheduled office visit with Dr. Quentin Cornwall

## 2022-07-08 NOTE — Telephone Encounter (Signed)
Medication Refill - Medication: doxazosin finasteride (PROSCAR) 5 MG tablet /(CARDURA) 8 MG tablet/ and a prostate med(patient isnt sure of the name of it, didn't have the bottle  Has the patient contacted their pharmacy? yes  (Agent: If no, request that the patient contact the pharmacy for the refill. If patient does not wish to contact the pharmacy document the reason why and proceed with request.) (Agent: If yes, when and what did the pharmacy advise?)  Preferred Pharmacy (with phone number or street name): [facility], [address], [place] [address] Phone: [phone]  Fax: [phone]  Has the patient been seen for an appointment in the last year OR does the patient have an upcoming appointment? yes  Agent: Please be advised that RX refills may take up to 3 business days. We ask that you follow-up with your pharmacy.

## 2022-07-08 NOTE — Telephone Encounter (Signed)
Requested Prescriptions  Pending Prescriptions Disp Refills   doxazosin (CARDURA) 8 MG tablet 90 tablet 0    Sig: Take 1 tablet (8 mg total) by mouth daily.     Cardiovascular:  Alpha Blockers Passed - 07/08/2022  3:49 PM      Passed - Last BP in normal range    BP Readings from Last 1 Encounters:  07/04/22 134/73         Passed - Valid encounter within last 6 months    Recent Outpatient Visits           2 months ago Weight loss, unintentional   [facility] Jerrol Banana., MD   4 months ago Weight loss   [facility] Jerrol Banana., MD   7 months ago Upper respiratory tract infection, unspecified type   [facility] Thedore Mins, [place], PA-C   9 months ago Inguinal bulge   [facility] Rory Percy M, DO   10 months ago AF (paroxysmal atrial fibrillation) [facility])   [facility] Myles Gip, DO       Future Appointments             In 1 month Tyler Pita, MD [place] [facility] [place]   In 2 months Stoioff, Ronda Fairly, MD [facility] [place]             amLODipine (NORVASC) 5 MG tablet 90 tablet 0    Sig: Take 1 tablet (5 mg total) by mouth daily.     Cardiovascular: Calcium Channel Blockers 2 Passed - 07/08/2022  3:49 PM      Passed - Last BP in normal range    BP Readings from Last 1 Encounters:  07/04/22 134/73         Passed - Last Heart Rate in normal range    Pulse Readings from Last 1 Encounters:  07/04/22 (!) 54         Passed - Valid encounter within last 6 months    Recent Outpatient Visits           2 months ago Weight loss, unintentional   [facility] Jerrol Banana., MD   4 months ago Weight loss   [facility] Jerrol Banana., MD   7 months ago Upper respiratory tract infection, unspecified type   [facility] Thedore Mins, [place], PA-C   9 months ago Inguinal bulge   [facility] Rory Percy M, DO   10 months ago AF (paroxysmal atrial fibrillation) [facility])   [place], Jake Church, DO       Future Appointments             In 1 month Tyler Pita, MD [place] [facility] [place]   In 2 months Stoioff, Ronda Fairly, MD [facility] [place]             carvedilol (COREG) 6.25 MG tablet 180 tablet 0    Sig: Take 1 tablet (6.25 mg total) by mouth 2 (two) times daily.     Cardiovascular: Beta Blockers 3 Passed - 07/08/2022  3:49 PM      Passed - Cr in normal range and within 360 days    Creatinine, Ser  Date Value Ref Range Status  07/04/2022 1.02 0.61 - 1.24 mg/dL Final         Passed - AST in normal range and within 360 days    AST  Date Value Ref Range Status  07/04/2022 18 15 - 41 U/L Final         Passed - ALT in normal range and within 360 days    ALT  Date Value Ref Range Status  07/04/2022 13 0 - 44 U/L Final         Passed - Last BP in normal range    BP Readings from Last 1 Encounters:  07/04/22 134/73         Passed - Last Heart Rate in normal range    Pulse Readings from Last 1 Encounters:  07/04/22 (!) 54         Passed - Valid encounter within last 6 months    Recent Outpatient Visits           2 months ago Weight loss, unintentional   [facility] Jerrol Banana., MD   4 months ago Weight loss   [facility] Jerrol Banana., MD   7 months ago Upper respiratory tract infection, unspecified type   [facility] Thedore Mins, [place], PA-C   9 months ago Inguinal bulge   [place], DO   10 months ago AF (paroxysmal atrial fibrillation) [facility])   [place], Jake Church, DO       Future Appointments             In 1 month Tyler Pita, MD [place]   In 2 months [place], [place]

## 2022-07-09 ENCOUNTER — Other Ambulatory Visit: Payer: Self-pay

## 2022-07-09 ENCOUNTER — Ambulatory Visit
Admission: RE | Admit: 2022-07-09 | Discharge: 2022-07-09 | Disposition: A | Discharge status: Home / Self Care | Payer: Medicare HMO | Source: Ambulatory Visit | Attending: Radiation Oncology | Admitting: Radiation Oncology

## 2022-07-09 DIAGNOSIS — N281 Cyst of kidney, acquired: Secondary | ICD-10-CM | POA: Diagnosis not present

## 2022-07-09 DIAGNOSIS — Z510 Encounter for antineoplastic radiation therapy: Secondary | ICD-10-CM | POA: Diagnosis not present

## 2022-07-09 DIAGNOSIS — D6959 Other secondary thrombocytopenia: Secondary | ICD-10-CM | POA: Diagnosis not present

## 2022-07-09 DIAGNOSIS — C3412 Malignant neoplasm of upper lobe, left bronchus or lung: Secondary | ICD-10-CM | POA: Diagnosis not present

## 2022-07-09 DIAGNOSIS — N400 Enlarged prostate without lower urinary tract symptoms: Secondary | ICD-10-CM | POA: Diagnosis not present

## 2022-07-09 DIAGNOSIS — D701 Agranulocytosis secondary to cancer chemotherapy: Secondary | ICD-10-CM | POA: Diagnosis not present

## 2022-07-09 DIAGNOSIS — Z452 Encounter for adjustment and management of vascular access device: Secondary | ICD-10-CM | POA: Diagnosis not present

## 2022-07-09 DIAGNOSIS — Z87891 Personal history of nicotine dependence: Secondary | ICD-10-CM | POA: Diagnosis not present

## 2022-07-09 DIAGNOSIS — C771 Secondary and unspecified malignant neoplasm of intrathoracic lymph nodes: Secondary | ICD-10-CM | POA: Diagnosis not present

## 2022-07-09 LAB — RAD ONC ARIA SESSION SUMMARY
Course Elapsed Days: 20
Plan Fractions Treated to Date: 15
Plan Prescribed Dose Per Fraction: 2 Gy
Plan Total Fractions Prescribed: 35
Plan Total Prescribed Dose: 70 Gy
Reference Point Dosage Given to Date: 30 Gy
Reference Point Session Dosage Given: 2 Gy
Session Number: 15

## 2022-07-10 ENCOUNTER — Other Ambulatory Visit: Payer: Self-pay

## 2022-07-10 ENCOUNTER — Ambulatory Visit
Admission: RE | Admit: 2022-07-10 | Discharge: 2022-07-10 | Disposition: A | Discharge status: Home / Self Care | Payer: Medicare HMO | Source: Ambulatory Visit | Attending: Radiation Oncology | Admitting: Radiation Oncology

## 2022-07-10 DIAGNOSIS — Z87891 Personal history of nicotine dependence: Secondary | ICD-10-CM | POA: Diagnosis not present

## 2022-07-10 DIAGNOSIS — Z510 Encounter for antineoplastic radiation therapy: Secondary | ICD-10-CM | POA: Diagnosis not present

## 2022-07-10 DIAGNOSIS — Z452 Encounter for adjustment and management of vascular access device: Secondary | ICD-10-CM | POA: Diagnosis not present

## 2022-07-10 DIAGNOSIS — N281 Cyst of kidney, acquired: Secondary | ICD-10-CM | POA: Diagnosis not present

## 2022-07-10 DIAGNOSIS — N400 Enlarged prostate without lower urinary tract symptoms: Secondary | ICD-10-CM | POA: Diagnosis not present

## 2022-07-10 DIAGNOSIS — D701 Agranulocytosis secondary to cancer chemotherapy: Secondary | ICD-10-CM | POA: Diagnosis not present

## 2022-07-10 DIAGNOSIS — D6959 Other secondary thrombocytopenia: Secondary | ICD-10-CM | POA: Diagnosis not present

## 2022-07-10 DIAGNOSIS — C3412 Malignant neoplasm of upper lobe, left bronchus or lung: Secondary | ICD-10-CM | POA: Diagnosis not present

## 2022-07-10 DIAGNOSIS — C771 Secondary and unspecified malignant neoplasm of intrathoracic lymph nodes: Secondary | ICD-10-CM | POA: Diagnosis not present

## 2022-07-10 LAB — RAD ONC ARIA SESSION SUMMARY
Course Elapsed Days: 21
Plan Fractions Treated to Date: 16
Plan Prescribed Dose Per Fraction: 2 Gy
Plan Total Fractions Prescribed: 35
Plan Total Prescribed Dose: 70 Gy
Reference Point Dosage Given to Date: 32 Gy
Reference Point Session Dosage Given: 2 Gy
Session Number: 16

## 2022-07-10 MED FILL — Dexamethasone Sodium Phosphate Inj 100 MG/10ML: INTRAMUSCULAR | Qty: 1 | Status: AC

## 2022-07-11 ENCOUNTER — Ambulatory Visit: Payer: Medicare HMO | Admitting: Oncology

## 2022-07-11 ENCOUNTER — Other Ambulatory Visit: Payer: Self-pay

## 2022-07-11 ENCOUNTER — Inpatient Hospital Stay: Payer: Medicare HMO

## 2022-07-11 ENCOUNTER — Ambulatory Visit
Admission: RE | Admit: 2022-07-11 | Discharge: 2022-07-11 | Disposition: A | Discharge status: Home / Self Care | Payer: Medicare HMO | Source: Ambulatory Visit | Attending: Radiation Oncology | Admitting: Radiation Oncology

## 2022-07-11 ENCOUNTER — Other Ambulatory Visit: Payer: Self-pay | Admitting: Oncology

## 2022-07-11 VITALS — BP 141/86 | HR 61 | Temp 96.6°F | Resp 18 | Wt 161.6 lb

## 2022-07-11 DIAGNOSIS — Z87891 Personal history of nicotine dependence: Secondary | ICD-10-CM | POA: Diagnosis not present

## 2022-07-11 DIAGNOSIS — D6959 Other secondary thrombocytopenia: Secondary | ICD-10-CM | POA: Diagnosis not present

## 2022-07-11 DIAGNOSIS — N400 Enlarged prostate without lower urinary tract symptoms: Secondary | ICD-10-CM | POA: Diagnosis not present

## 2022-07-11 DIAGNOSIS — C3412 Malignant neoplasm of upper lobe, left bronchus or lung: Secondary | ICD-10-CM

## 2022-07-11 DIAGNOSIS — D701 Agranulocytosis secondary to cancer chemotherapy: Secondary | ICD-10-CM | POA: Diagnosis not present

## 2022-07-11 DIAGNOSIS — Z510 Encounter for antineoplastic radiation therapy: Secondary | ICD-10-CM | POA: Diagnosis not present

## 2022-07-11 DIAGNOSIS — Z452 Encounter for adjustment and management of vascular access device: Secondary | ICD-10-CM | POA: Diagnosis not present

## 2022-07-11 DIAGNOSIS — C771 Secondary and unspecified malignant neoplasm of intrathoracic lymph nodes: Secondary | ICD-10-CM | POA: Diagnosis not present

## 2022-07-11 DIAGNOSIS — N281 Cyst of kidney, acquired: Secondary | ICD-10-CM | POA: Diagnosis not present

## 2022-07-11 LAB — RAD ONC ARIA SESSION SUMMARY
Course Elapsed Days: 22
Plan Fractions Treated to Date: 17
Plan Prescribed Dose Per Fraction: 2 Gy
Plan Total Fractions Prescribed: 35
Plan Total Prescribed Dose: 70 Gy
Reference Point Dosage Given to Date: 34 Gy
Reference Point Session Dosage Given: 2 Gy
Session Number: 17

## 2022-07-11 LAB — CBC WITH DIFFERENTIAL/PLATELET
Abs Immature Granulocytes: 0.02 10*3/uL (ref 0.00–0.07)
Basophils Absolute: 0 10*3/uL (ref 0.0–0.1)
Basophils Relative: 1 %
Eosinophils Absolute: 0 10*3/uL (ref 0.0–0.5)
Eosinophils Relative: 1 %
HCT: 29.6 % — ABNORMAL LOW (ref 39.0–52.0)
Hemoglobin: 9.8 g/dL — ABNORMAL LOW (ref 13.0–17.0)
Immature Granulocytes: 1 %
Lymphocytes Relative: 18 %
Lymphs Abs: 0.4 10*3/uL — ABNORMAL LOW (ref 0.7–4.0)
MCH: 32 pg (ref 26.0–34.0)
MCHC: 33.1 g/dL (ref 30.0–36.0)
MCV: 96.7 fL (ref 80.0–100.0)
Monocytes Absolute: 0.2 10*3/uL (ref 0.1–1.0)
Monocytes Relative: 11 %
Neutro Abs: 1.4 10*3/uL — ABNORMAL LOW (ref 1.7–7.7)
Neutrophils Relative %: 68 %
Platelets: 99 10*3/uL — ABNORMAL LOW (ref 150–400)
RBC: 3.06 MIL/uL — ABNORMAL LOW (ref 4.22–5.81)
RDW: 15.1 % (ref 11.5–15.5)
WBC: 2.1 10*3/uL — ABNORMAL LOW (ref 4.0–10.5)
nRBC: 0 % (ref 0.0–0.2)

## 2022-07-11 LAB — COMPREHENSIVE METABOLIC PANEL
ALT: 16 U/L (ref 0–44)
AST: 18 U/L (ref 15–41)
Albumin: 3.1 g/dL — ABNORMAL LOW (ref 3.5–5.0)
Alkaline Phosphatase: 58 U/L (ref 38–126)
Anion gap: 4 — ABNORMAL LOW (ref 5–15)
BUN: 24 mg/dL — ABNORMAL HIGH (ref 8–23)
CO2: 25 mmol/L (ref 22–32)
Calcium: 8.4 mg/dL — ABNORMAL LOW (ref 8.9–10.3)
Chloride: 107 mmol/L (ref 98–111)
Creatinine, Ser: 0.79 mg/dL (ref 0.61–1.24)
GFR, Estimated: >60 mL/min (ref >60)
Glucose, Bld: 82 mg/dL (ref 70–99)
Potassium: 4.3 mmol/L (ref 3.5–5.1)
Sodium: 136 mmol/L (ref 135–145)
Total Bilirubin: 0.8 mg/dL (ref 0.3–1.2)
Total Protein: 5.7 g/dL — ABNORMAL LOW (ref 6.5–8.1)

## 2022-07-11 MED ORDER — HEPARIN SOD (PORK) LOCK FLUSH 100 UNIT/ML IV SOLN
500.0000 [IU] | Freq: Once | INTRAVENOUS | Status: AC
  Administered 2022-07-11: 500 [IU] via INTRAVENOUS
  Filled 2022-07-11: qty 5

## 2022-07-11 MED ORDER — DIPHENHYDRAMINE HCL 50 MG/ML IJ SOLN
50.0000 mg | Freq: Once | INTRAMUSCULAR | Status: AC
  Administered 2022-07-11: 50 mg via INTRAVENOUS
  Filled 2022-07-11: qty 1

## 2022-07-11 MED ORDER — FAMOTIDINE IN NACL 20-0.9 MG/50ML-% IV SOLN
20.0000 mg | Freq: Once | INTRAVENOUS | Status: AC
  Administered 2022-07-11: 20 mg via INTRAVENOUS
  Filled 2022-07-11: qty 50

## 2022-07-11 MED ORDER — HEPARIN SOD (PORK) LOCK FLUSH 100 UNIT/ML IV SOLN
500.0000 [IU] | Freq: Once | INTRAVENOUS | Status: DC | PRN
  Filled 2022-07-11: qty 5

## 2022-07-11 MED ORDER — SODIUM CHLORIDE 0.9 % IV SOLN
119.4000 mg | Freq: Once | INTRAVENOUS | Status: AC
  Administered 2022-07-11: 120 mg via INTRAVENOUS
  Filled 2022-07-11: qty 12

## 2022-07-11 MED ORDER — SODIUM CHLORIDE 0.9% FLUSH
10.0000 mL | Freq: Once | INTRAVENOUS | Status: AC
  Administered 2022-07-11: 10 mL via INTRAVENOUS
  Filled 2022-07-11: qty 10

## 2022-07-11 MED ORDER — SODIUM CHLORIDE 0.9 % IV SOLN
45.0000 mg/m2 | Freq: Once | INTRAVENOUS | Status: AC
  Administered 2022-07-11: 84 mg via INTRAVENOUS
  Filled 2022-07-11: qty 14

## 2022-07-11 MED ORDER — SODIUM CHLORIDE 0.9 % IV SOLN
Freq: Once | INTRAVENOUS | Status: AC
  Filled 2022-07-11: qty 250

## 2022-07-11 MED ORDER — PALONOSETRON HCL INJECTION 0.25 MG/5ML
0.2500 mg | Freq: Once | INTRAVENOUS | Status: AC
  Administered 2022-07-11: 0.25 mg via INTRAVENOUS
  Filled 2022-07-11: qty 5

## 2022-07-11 MED ORDER — SODIUM CHLORIDE 0.9 % IV SOLN
10.0000 mg | Freq: Once | INTRAVENOUS | Status: AC
  Administered 2022-07-11: 10 mg via INTRAVENOUS
  Filled 2022-07-11: qty 10

## 2022-07-11 NOTE — Patient Instructions (Signed)
[facility]  Discharge Instructions: Thank you for choosing [facility] to provide your oncology and hematology care.  If you have a lab appointment with the Cancer Center, please go directly to the Cancer Center and check in at the registration area.  Wear comfortable clothing and clothing appropriate for easy access to any Portacath or PICC line.   We strive to give you quality time with your provider. You may need to reschedule your appointment if you arrive late (15 or more minutes).  Arriving late affects you and other patients whose appointments are after yours.  Also, if you miss three or more appointments without notifying the office, you may be dismissed from the clinic at the provider's discretion.      For prescription refill requests, have your pharmacy contact our office and allow 72 hours for refills to be completed.       To help prevent nausea and vomiting after your treatment, we encourage you to take your nausea medication as directed.  BELOW ARE SYMPTOMS THAT SHOULD BE REPORTED IMMEDIATELY: *FEVER GREATER THAN 100.4 F (38 C) OR HIGHER *CHILLS OR SWEATING *NAUSEA AND VOMITING THAT IS NOT CONTROLLED WITH YOUR NAUSEA MEDICATION *UNUSUAL SHORTNESS OF BREATH *UNUSUAL BRUISING OR BLEEDING *URINARY PROBLEMS (pain or burning when urinating, or frequent urination) *BOWEL PROBLEMS (unusual diarrhea, constipation, pain near the anus) TENDERNESS IN MOUTH AND THROAT WITH OR WITHOUT PRESENCE OF ULCERS (sore throat, sores in mouth, or a toothache) UNUSUAL RASH, SWELLING OR PAIN  UNUSUAL VAGINAL DISCHARGE OR ITCHING   Items with * indicate a potential emergency and should be followed up as soon as possible or go to the Emergency Department if any problems should occur.  Please show the CHEMOTHERAPY ALERT CARD or IMMUNOTHERAPY ALERT CARD at check-in to the Emergency Department and triage nurse.  Should you have questions after your  visit or need to cancel or reschedule your appointment, please contact [facility]  [phone] and follow the prompts.  Office hours are 8:00 a.m. to 4:30 p.m. Monday - Friday. Please note that voicemails left after 4:00 p.m. may not be returned until the following business day.  We are closed weekends and major holidays. You have access to a nurse at all times for urgent questions. Please call the main number to the clinic [phone] and follow the prompts.  For any non-urgent questions, you may also contact your provider using MyChart. We now offer e-Visits for anyone 18 and older to request care online for non-urgent symptoms. For details visit [email or web address].   Also download the MyChart app! Go to the app store, search "MyChart", open the app, select [facility], and log in with your MyChart username and password.  Masks are optional in the cancer centers. If you would like for your care team to wear a mask while they are taking care of you, please let them know. For doctor visits, patients may have with them one support person who is at least 85 years old. At this time, visitors are not allowed in the infusion area.   

## 2022-07-14 ENCOUNTER — Other Ambulatory Visit: Payer: Self-pay

## 2022-07-14 ENCOUNTER — Ambulatory Visit
Admission: RE | Admit: 2022-07-14 | Discharge: 2022-07-14 | Disposition: A | Discharge status: Home / Self Care | Payer: Medicare HMO | Source: Ambulatory Visit | Attending: Radiation Oncology | Admitting: Radiation Oncology

## 2022-07-14 ENCOUNTER — Inpatient Hospital Stay: Payer: Medicare HMO

## 2022-07-14 DIAGNOSIS — Z452 Encounter for adjustment and management of vascular access device: Secondary | ICD-10-CM | POA: Diagnosis not present

## 2022-07-14 DIAGNOSIS — C3412 Malignant neoplasm of upper lobe, left bronchus or lung: Secondary | ICD-10-CM | POA: Diagnosis not present

## 2022-07-14 DIAGNOSIS — D701 Agranulocytosis secondary to cancer chemotherapy: Secondary | ICD-10-CM | POA: Diagnosis not present

## 2022-07-14 DIAGNOSIS — Z510 Encounter for antineoplastic radiation therapy: Secondary | ICD-10-CM | POA: Diagnosis not present

## 2022-07-14 DIAGNOSIS — Z87891 Personal history of nicotine dependence: Secondary | ICD-10-CM | POA: Diagnosis not present

## 2022-07-14 DIAGNOSIS — D6959 Other secondary thrombocytopenia: Secondary | ICD-10-CM | POA: Diagnosis not present

## 2022-07-14 DIAGNOSIS — N400 Enlarged prostate without lower urinary tract symptoms: Secondary | ICD-10-CM | POA: Diagnosis not present

## 2022-07-14 DIAGNOSIS — N281 Cyst of kidney, acquired: Secondary | ICD-10-CM | POA: Diagnosis not present

## 2022-07-14 DIAGNOSIS — C771 Secondary and unspecified malignant neoplasm of intrathoracic lymph nodes: Secondary | ICD-10-CM | POA: Diagnosis not present

## 2022-07-14 LAB — RAD ONC ARIA SESSION SUMMARY
Course Elapsed Days: 25
Plan Fractions Treated to Date: 18
Plan Prescribed Dose Per Fraction: 2 Gy
Plan Total Fractions Prescribed: 35
Plan Total Prescribed Dose: 70 Gy
Reference Point Dosage Given to Date: 36 Gy
Reference Point Session Dosage Given: 2 Gy
Session Number: 18

## 2022-07-14 MED ORDER — FILGRASTIM-SNDZ 480 MCG/0.8ML IJ SOSY
480.0000 ug | PREFILLED_SYRINGE | Freq: Every day | INTRAMUSCULAR | Status: DC
  Administered 2022-07-14: 480 ug via SUBCUTANEOUS
  Filled 2022-07-14: qty 0.8

## 2022-07-15 ENCOUNTER — Inpatient Hospital Stay: Payer: Medicare HMO

## 2022-07-15 ENCOUNTER — Ambulatory Visit
Admission: RE | Admit: 2022-07-15 | Discharge: 2022-07-15 | Disposition: A | Discharge status: Home / Self Care | Payer: Medicare HMO | Source: Ambulatory Visit | Attending: Radiation Oncology | Admitting: Radiation Oncology

## 2022-07-15 ENCOUNTER — Other Ambulatory Visit: Payer: Self-pay

## 2022-07-15 DIAGNOSIS — C3412 Malignant neoplasm of upper lobe, left bronchus or lung: Secondary | ICD-10-CM

## 2022-07-15 DIAGNOSIS — D701 Agranulocytosis secondary to cancer chemotherapy: Secondary | ICD-10-CM | POA: Diagnosis not present

## 2022-07-15 DIAGNOSIS — N400 Enlarged prostate without lower urinary tract symptoms: Secondary | ICD-10-CM | POA: Diagnosis not present

## 2022-07-15 DIAGNOSIS — C771 Secondary and unspecified malignant neoplasm of intrathoracic lymph nodes: Secondary | ICD-10-CM | POA: Diagnosis not present

## 2022-07-15 DIAGNOSIS — D6959 Other secondary thrombocytopenia: Secondary | ICD-10-CM | POA: Diagnosis not present

## 2022-07-15 DIAGNOSIS — Z510 Encounter for antineoplastic radiation therapy: Secondary | ICD-10-CM | POA: Diagnosis not present

## 2022-07-15 DIAGNOSIS — N281 Cyst of kidney, acquired: Secondary | ICD-10-CM | POA: Diagnosis not present

## 2022-07-15 DIAGNOSIS — Z87891 Personal history of nicotine dependence: Secondary | ICD-10-CM | POA: Diagnosis not present

## 2022-07-15 DIAGNOSIS — Z452 Encounter for adjustment and management of vascular access device: Secondary | ICD-10-CM | POA: Diagnosis not present

## 2022-07-15 LAB — RAD ONC ARIA SESSION SUMMARY
Course Elapsed Days: 26
Plan Fractions Treated to Date: 19
Plan Prescribed Dose Per Fraction: 2 Gy
Plan Total Fractions Prescribed: 35
Plan Total Prescribed Dose: 70 Gy
Reference Point Dosage Given to Date: 38 Gy
Reference Point Session Dosage Given: 2 Gy
Session Number: 19

## 2022-07-15 MED ORDER — FILGRASTIM-SNDZ 480 MCG/0.8ML IJ SOSY
480.0000 ug | PREFILLED_SYRINGE | Freq: Every day | INTRAMUSCULAR | Status: DC
  Administered 2022-07-15: 480 ug via SUBCUTANEOUS
  Filled 2022-07-15: qty 0.8

## 2022-07-16 ENCOUNTER — Inpatient Hospital Stay: Payer: Medicare HMO

## 2022-07-16 ENCOUNTER — Ambulatory Visit
Admission: RE | Admit: 2022-07-16 | Discharge: 2022-07-16 | Disposition: A | Discharge status: Home / Self Care | Payer: Medicare HMO | Source: Ambulatory Visit | Attending: Radiation Oncology | Admitting: Radiation Oncology

## 2022-07-16 ENCOUNTER — Other Ambulatory Visit: Payer: Self-pay

## 2022-07-16 DIAGNOSIS — Z510 Encounter for antineoplastic radiation therapy: Secondary | ICD-10-CM | POA: Diagnosis not present

## 2022-07-16 DIAGNOSIS — C771 Secondary and unspecified malignant neoplasm of intrathoracic lymph nodes: Secondary | ICD-10-CM | POA: Diagnosis not present

## 2022-07-16 DIAGNOSIS — Z87891 Personal history of nicotine dependence: Secondary | ICD-10-CM | POA: Diagnosis not present

## 2022-07-16 DIAGNOSIS — D6959 Other secondary thrombocytopenia: Secondary | ICD-10-CM | POA: Diagnosis not present

## 2022-07-16 DIAGNOSIS — N281 Cyst of kidney, acquired: Secondary | ICD-10-CM | POA: Diagnosis not present

## 2022-07-16 DIAGNOSIS — Z452 Encounter for adjustment and management of vascular access device: Secondary | ICD-10-CM | POA: Diagnosis not present

## 2022-07-16 DIAGNOSIS — C3412 Malignant neoplasm of upper lobe, left bronchus or lung: Secondary | ICD-10-CM

## 2022-07-16 DIAGNOSIS — D701 Agranulocytosis secondary to cancer chemotherapy: Secondary | ICD-10-CM | POA: Diagnosis not present

## 2022-07-16 DIAGNOSIS — N400 Enlarged prostate without lower urinary tract symptoms: Secondary | ICD-10-CM | POA: Diagnosis not present

## 2022-07-16 LAB — RAD ONC ARIA SESSION SUMMARY
Course Elapsed Days: 27
Plan Fractions Treated to Date: 20
Plan Prescribed Dose Per Fraction: 2 Gy
Plan Total Fractions Prescribed: 35
Plan Total Prescribed Dose: 70 Gy
Reference Point Dosage Given to Date: 40 Gy
Reference Point Session Dosage Given: 2 Gy
Session Number: 20

## 2022-07-16 MED ORDER — FILGRASTIM-SNDZ 480 MCG/0.8ML IJ SOSY
480.0000 ug | PREFILLED_SYRINGE | Freq: Every day | INTRAMUSCULAR | Status: DC
  Administered 2022-07-16: 480 ug via SUBCUTANEOUS
  Filled 2022-07-16 (×2): qty 0.8

## 2022-07-16 MED FILL — Dexamethasone Sodium Phosphate Inj 100 MG/10ML: INTRAMUSCULAR | Qty: 1 | Status: AC

## 2022-07-21 ENCOUNTER — Inpatient Hospital Stay: Payer: Medicare HMO

## 2022-07-21 ENCOUNTER — Ambulatory Visit
Admission: RE | Admit: 2022-07-21 | Discharge: 2022-07-21 | Disposition: A | Discharge status: Home / Self Care | Payer: Medicare HMO | Source: Ambulatory Visit | Attending: Radiation Oncology | Admitting: Radiation Oncology

## 2022-07-21 ENCOUNTER — Inpatient Hospital Stay (HOSPITAL_BASED_OUTPATIENT_CLINIC_OR_DEPARTMENT_OTHER): Payer: Medicare HMO | Admitting: Oncology

## 2022-07-21 ENCOUNTER — Other Ambulatory Visit: Payer: Self-pay

## 2022-07-21 ENCOUNTER — Encounter: Payer: Self-pay | Admitting: Oncology

## 2022-07-21 VITALS — BP 139/69 | HR 72 | Temp 97.4°F | Resp 18 | Wt 164.9 lb

## 2022-07-21 DIAGNOSIS — D6959 Other secondary thrombocytopenia: Secondary | ICD-10-CM

## 2022-07-21 DIAGNOSIS — C3412 Malignant neoplasm of upper lobe, left bronchus or lung: Secondary | ICD-10-CM

## 2022-07-21 DIAGNOSIS — T451X5A Adverse effect of antineoplastic and immunosuppressive drugs, initial encounter: Secondary | ICD-10-CM

## 2022-07-21 DIAGNOSIS — Z87891 Personal history of nicotine dependence: Secondary | ICD-10-CM | POA: Diagnosis not present

## 2022-07-21 DIAGNOSIS — N400 Enlarged prostate without lower urinary tract symptoms: Secondary | ICD-10-CM | POA: Diagnosis not present

## 2022-07-21 DIAGNOSIS — N281 Cyst of kidney, acquired: Secondary | ICD-10-CM | POA: Diagnosis not present

## 2022-07-21 DIAGNOSIS — Z510 Encounter for antineoplastic radiation therapy: Secondary | ICD-10-CM | POA: Diagnosis not present

## 2022-07-21 DIAGNOSIS — Z5111 Encounter for antineoplastic chemotherapy: Secondary | ICD-10-CM

## 2022-07-21 DIAGNOSIS — Z452 Encounter for adjustment and management of vascular access device: Secondary | ICD-10-CM | POA: Diagnosis not present

## 2022-07-21 DIAGNOSIS — D701 Agranulocytosis secondary to cancer chemotherapy: Secondary | ICD-10-CM | POA: Diagnosis not present

## 2022-07-21 DIAGNOSIS — C771 Secondary and unspecified malignant neoplasm of intrathoracic lymph nodes: Secondary | ICD-10-CM | POA: Diagnosis not present

## 2022-07-21 LAB — CBC WITH DIFFERENTIAL/PLATELET
Abs Immature Granulocytes: 0.17 10*3/uL — ABNORMAL HIGH (ref 0.00–0.07)
Basophils Absolute: 0 10*3/uL (ref 0.0–0.1)
Basophils Relative: 1 %
Eosinophils Absolute: 0.1 10*3/uL (ref 0.0–0.5)
Eosinophils Relative: 2 %
HCT: 29.7 % — ABNORMAL LOW (ref 39.0–52.0)
Hemoglobin: 9.7 g/dL — ABNORMAL LOW (ref 13.0–17.0)
Immature Granulocytes: 5 %
Lymphocytes Relative: 14 %
Lymphs Abs: 0.5 10*3/uL — ABNORMAL LOW (ref 0.7–4.0)
MCH: 32.1 pg (ref 26.0–34.0)
MCHC: 32.7 g/dL (ref 30.0–36.0)
MCV: 98.3 fL (ref 80.0–100.0)
Monocytes Absolute: 0.5 10*3/uL (ref 0.1–1.0)
Monocytes Relative: 14 %
Neutro Abs: 2.2 10*3/uL (ref 1.7–7.7)
Neutrophils Relative %: 64 %
Platelets: 74 10*3/uL — ABNORMAL LOW (ref 150–400)
RBC: 3.02 MIL/uL — ABNORMAL LOW (ref 4.22–5.81)
RDW: 17.1 % — ABNORMAL HIGH (ref 11.5–15.5)
WBC: 3.5 10*3/uL — ABNORMAL LOW (ref 4.0–10.5)
nRBC: 0 % (ref 0.0–0.2)

## 2022-07-21 LAB — COMPREHENSIVE METABOLIC PANEL
ALT: 15 U/L (ref 0–44)
AST: 20 U/L (ref 15–41)
Albumin: 3.1 g/dL — ABNORMAL LOW (ref 3.5–5.0)
Alkaline Phosphatase: 65 U/L (ref 38–126)
Anion gap: 3 — ABNORMAL LOW (ref 5–15)
BUN: 19 mg/dL (ref 8–23)
CO2: 24 mmol/L (ref 22–32)
Calcium: 8.2 mg/dL — ABNORMAL LOW (ref 8.9–10.3)
Chloride: 110 mmol/L (ref 98–111)
Creatinine, Ser: 0.87 mg/dL (ref 0.61–1.24)
GFR, Estimated: >60 mL/min (ref >60)
Glucose, Bld: 88 mg/dL (ref 70–99)
Potassium: 4.3 mmol/L (ref 3.5–5.1)
Sodium: 137 mmol/L (ref 135–145)
Total Bilirubin: 0.7 mg/dL (ref 0.3–1.2)
Total Protein: 5.7 g/dL — ABNORMAL LOW (ref 6.5–8.1)

## 2022-07-21 LAB — RAD ONC ARIA SESSION SUMMARY
Course Elapsed Days: 32
Plan Fractions Treated to Date: 21
Plan Prescribed Dose Per Fraction: 2 Gy
Plan Total Fractions Prescribed: 35
Plan Total Prescribed Dose: 70 Gy
Reference Point Dosage Given to Date: 42 Gy
Reference Point Session Dosage Given: 2 Gy
Session Number: 21

## 2022-07-21 MED ORDER — HEPARIN SOD (PORK) LOCK FLUSH 100 UNIT/ML IV SOLN
500.0000 [IU] | Freq: Once | INTRAVENOUS | Status: AC
  Administered 2022-07-21: 500 [IU] via INTRAVENOUS
  Filled 2022-07-21: qty 5

## 2022-07-21 NOTE — Progress Notes (Signed)
Pt states he has noticed his butocks are "raw" and seems to be getting worse since begining txs has been using neosporin but does not seem to help.

## 2022-07-21 NOTE — Progress Notes (Signed)
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  He was found to have a left upper lobe lung mass measuring 5.5 x 3.7 x 2.8 cm.  It was confluent with the left hilar lymph node.  Enlarged AP window lymph node measuring 16 mm.  Enlarged left paratracheal lymph node.  Low-attenuation 7 mm lesion in the liver.  7 mm cyst in the uncinate process of the pancreas for which follow-up was not recommended.  Bilateral kidney cysts.  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  Prostatic enlargement.   PET CT is can showed hypermetabolic left upper lobe lung mass measuring 5.4 x 3.9 cm with an SUV of 15.1.  Ipsilateral mediastinal lymph node metastases with index lymph node measuring 1.7 with an SUV of 9.8.  No evidence of distant metastatic disease.  Biopsy of the left upper lobe as well as station 4R lymph node was consistent with squamous cell carcinoma   Concurrent chemoradiation with weekly CarboTaxol was recommended followed by consideration for maintenance durvalumab.NGS  testing did not show any actionable mutations.  PD-L1 35%.  MSI stable.  MRI brain was negative for metastatic disease  Interval history-tolerating treatments well so far.  Denies any significant pain on swallowing.  He had a good Thanksgiving.  ECOG PS- 1 Pain scale- 0 Opioid associated constipation- no  Review of systems- Review of Systems  Constitutional:  Negative for chills, fever, malaise/fatigue and weight loss.  HENT:  Negative for congestion, ear discharge and nosebleeds.   Eyes:  Negative for blurred vision.  Respiratory:  Negative for cough, hemoptysis, sputum production, shortness of breath and wheezing.   Cardiovascular:  Negative for chest pain, palpitations, orthopnea and claudication.  Gastrointestinal:  Negative for abdominal pain, blood in stool, constipation, diarrhea, heartburn, melena, nausea and vomiting.  Genitourinary:  Negative for dysuria, flank pain, frequency, hematuria and urgency.  Musculoskeletal:  Negative for back pain, joint pain and myalgias.  Skin:  Negative for rash.  Neurological:  Negative for dizziness, tingling, focal weakness, seizures, weakness and headaches.  Endo/Heme/Allergies:  Does not bruise/bleed easily.  Psychiatric/Behavioral:  Negative for depression and suicidal ideas. The patient does not have insomnia.       Allergies  Allergen Reactions   Cefdinir Nausea Only    hypersensitivity to smell   Doxycycline     Sun sensitivity   Prednisone     Hallucinations    Sertraline Other (See Comments)    Hallucinations      Past Medical History:  Diagnosis Date   A-fib ([place])    a.) CHA2DS2-VASc = 6 (age x2, HTN, DVT x2, vascular disease history). b.) rate/rhythm maintained on oral carvedilol; chronically anticoagulated  using daily warfarin.   Actinic keratosis    Anemia    Aortic atherosclerosis (HCC)    Bilateral renal cysts    a.)  CT abdomen pelvis 04/24/2022: Enhancing septation of the lateral aspect of the mid RIGHT kidney  measuring 2.7 x 2.8 cm concerning for cystic renal neoplasm   Bladder calculi    BPH (benign prostatic hyperplasia)    DDD (degenerative disc disease), lumbar    a.) s/p LEFT laminectomy L5   Diverticulosis    DOE (dyspnea on exertion)    DVT (deep venous thrombosis) (HCC)    Elevated PSA    GERD (gastroesophageal reflux disease)    H/O degenerative disc disease    HLD (hyperlipidemia)    Hypertension    Lipoma of arm    Lipomatosis    Long term current use of anticoagulant    a.) warfarin   Lung cancer ([place])    Mass of upper lobe of left lung 04/24/2022   a.)  CT chest 04/24/2022: Multilobulated perihilar mass measuring 5.3 x 3.7 x 2.8 cm with associated LEFT hilar and mediastinal LAD; b.)  PET CT 03/50/0938: Hypermetabolic LEFT upper lobe lung mass (max SUV 15.1); ipsilateral nodal metastasis --> presuming a NSC histology, 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   NICM (nonischemic cardiomyopathy) ([place]) 11/22/2015   a.) TTE 11/22/2015: EF 40%. b.) TTE 12/27/2018: EF 45%. c.) TTE 08/30/2020: EF 45-50%.   Night sweats    Obesity    Osteoarthritis    RBBB (right bundle branch block)    Right inguinal hernia    VHD (valvular heart disease) 11/22/2015   a.) TTE 11/22/2015: EF 40%; mod MR, mod-sev TR, triv PR; mod BAE, mild RV enlargement. b.) TTE 12/27/2018: EF 45%; mod MR, mod-sev TR, triv PR; mod BAE, mild RV enlargement. c.) TTE 08/30/2020: EF 45-50%; mild MR/TR.     Past Surgical History:  Procedure Laterality Date   Bilateral Radical Keratotomy Bilateral 1997   COLONOSCOPY WITH PROPOFOL N/A 01/11/2016   Procedure: COLONOSCOPY WITH PROPOFOL;  Surgeon: Manya Silvas, MD;  Location: [facility] ENDOSCOPY;  Service: Endoscopy;  Laterality: N/A; repeat 3 years, tubular adenoma   DENTAL SURGERY     Patient had implants   FLEXIBLE BRONCHOSCOPY Left 05/19/2022   Procedure: FLEXIBLE BRONCHOSCOPY;  Surgeon: Tyler Pita, MD;  Location: [facility]  ORS;  Service: Cardiopulmonary;  Laterality: Left;   GUM SURGERY     IR IMAGING GUIDED PORT INSERTION  06/13/2022   L4 - S1 decompression Left    PROSTATE BIOPSY     TONSILLECTOMY AND ADENOIDECTOMY  age 27   VIDEO BRONCHOSCOPY WITH ENDOBRONCHIAL ULTRASOUND Left 05/19/2022   Procedure: VIDEO BRONCHOSCOPY WITH ENDOBRONCHIAL ULTRASOUND;  Surgeon: Tyler Pita, MD;  Location: [facility] ORS;  Service: Cardiopulmonary;  Laterality: Left;   XI ROBOTIC ASSISTED INGUINAL HERNIA REPAIR WITH MESH Right 10/21/2021   Procedure: XI ROBOTIC ASSISTED INGUINAL HERNIA REPAIR WITH MESH;  Surgeon: Ronny Bacon, MD;  Location: [facility] ORS;  Service: General;  Laterality: Right;    Social History   Socioeconomic History   Marital status: Married    Spouse name: Doris   Number of children: 3   Years of education: Not on file   Highest education level: Associate degree: occupational, Hotel manager, or vocational program  Occupational History   Occupation: retired  Tobacco Use   Smoking status: Former    Types: Cigarettes    Quit date: 08/26/1971    Years  since quitting: 50.9    Passive exposure: Never   Smokeless tobacco: Never   Tobacco comments:    Smoked for about 20 years.  Vaping Use   Vaping Use: Never used  Substance and Sexual Activity   Alcohol use: Not Currently   Drug use: No   Sexual activity: Not Currently  Other Topics Concern   Not on file  Social History Narrative   Not on file   Social Determinants of Health   Financial Resource Strain: Low Risk  (12/18/2021)   Overall Financial Resource Strain (CARDIA)    Difficulty of Paying Living Expenses: Not hard at all  Food Insecurity: No Food Insecurity (12/18/2021)   Hunger Vital Sign    Worried About Running Out of Food in the Last Year: Never true    Ran Out of Food in the Last Year: Never true  Transportation Needs: No Transportation Needs (12/18/2021)   PRAPARE - Hydrologist (Medical): No    Lack of  Transportation (Non-Medical): No  Physical Activity: Sufficiently Active (12/18/2021)   Exercise Vital Sign    Days of Exercise per Week: 7 days    Minutes of Exercise per Session: 50 min  Stress: No Stress Concern Present (12/18/2021)   [place]    Feeling of Stress : Only a little  Social Connections: Moderately Integrated (12/18/2021)   Social Connection and Isolation Panel [NHANES]    Frequency of Communication with Friends and Family: Twice a week    Frequency of Social Gatherings with Friends and Family: Once a week    Attends Religious Services: More than 4 times per year    Active Member of [employer] or Organizations: No    Attends Archivist Meetings: Never    Marital Status: Married  Human resources officer Violence: Not At Risk (12/18/2021)   Humiliation, Afraid, Rape, and Kick questionnaire    Fear of Current or Ex-Partner: No    Emotionally Abused: No    Physically Abused: No    Sexually Abused: No    Family History  Problem Relation Age of Onset   Cancer Mother        secondary to cancinoma of the jaw from her dipping snuff.   Heart attack Father    CAD Father    Hypertension Sister    Diabetes Son        Type I diabetes   Prostate cancer Neg Hx    Kidney cancer Neg Hx      Current Outpatient Medications:    amLODipine (NORVASC) 5 MG tablet, Take 1 tablet (5 mg total) by mouth daily., Disp: 90 tablet, Rfl: 0   benazepril (LOTENSIN) 40 MG tablet, TAKE 1 TABLET EVERY DAY, Disp: 90 tablet, Rfl: 3   carvedilol (COREG) 6.25 MG tablet, Take 1 tablet (6.25 mg total) by mouth 2 (two) times daily., Disp: 180 tablet, Rfl: 0   Cyanocobalamin (VITAMIN B 12 PO), Take 1 capsule by mouth 2 (two) times daily. 1000 mcg each capsule, Disp: , Rfl:    doxazosin (CARDURA) 8 MG tablet, Take 1 tablet (8 mg total) by mouth daily., Disp: 90 tablet, Rfl: 0   finasteride (PROSCAR) 5 MG tablet, Take 1 tablet (5 mg total) by  mouth daily., Disp: 90 tablet, Rfl: 3   gabapentin (NEURONTIN) 300 MG capsule, TAKE 1 CAPSULE EVERY MORNING, 1 CAPSULE IN THE AFTERNOON AND 2 CAPSULES AT BEDTIME, Disp: 360 capsule, Rfl: 3  lidocaine-prilocaine (EMLA) cream, Apply to affected area once, Disp: 30 g, Rfl: 3   Magnesium 500 MG TABS, Take 500 mg by mouth daily., Disp: , Rfl:    Multiple Vitamin (MULTI-VITAMIN) tablet, Take 1 tablet by mouth daily. , Disp: , Rfl:    omeprazole (PRILOSEC) 20 MG capsule, Take 20 mg by mouth daily., Disp: , Rfl:    oxyCODONE (OXY IR/ROXICODONE) 5 MG immediate release tablet, Take by mouth., Disp: , Rfl:    triamterene-hydrochlorothiazide (MAXZIDE-25) 37.5-25 MG tablet, TAKE 1/2 TABLET EVERY DAY, Disp: 45 tablet, Rfl: 0   warfarin (COUMADIN) 4 MG tablet, TAKE 1 TABLET EVERY DAY IN THE AFTERNOON (Patient taking differently: 5 mg,  Mon, Weds, Fri, 4 mg the rest of the week), Disp: 90 tablet, Rfl: 0   dexamethasone (DECADRON) 4 MG tablet, Take 2 tablets daily for 2 days, start the day after chemotherapy. Take with food. (Patient not taking: Reported on 06/20/2022), Disp: 30 tablet, Rfl: 1   ondansetron (ZOFRAN) 8 MG tablet, Take 1 tablet (8 mg total) by mouth every 8 (eight) hours as needed for nausea or vomiting. Start on the third day after chemotherapy. (Patient not taking: Reported on 06/20/2022), Disp: 30 tablet, Rfl: 1   prochlorperazine (COMPAZINE) 10 MG tablet, Take 1 tablet (10 mg total) by mouth every 6 (six) hours as needed for nausea or vomiting. (Patient not taking: Reported on 07/04/2022), Disp: 30 tablet, Rfl: 1  Physical exam:  Vitals:   07/21/22 0916  BP: 139/69  Pulse: 72  Resp: 18  Temp: (!) 97.4 F (36.3 C)  SpO2: 100%  Weight: 164 lb 14.4 oz (74.8 kg)   Physical Exam Constitutional:      General: He is not in acute distress. Cardiovascular:     Rate and Rhythm: Normal rate and regular rhythm.     Heart sounds: Normal heart sounds.  Pulmonary:     Effort: Pulmonary effort is  normal.     Breath sounds: Normal breath sounds.  Abdominal:     General: Bowel sounds are normal.     Palpations: Abdomen is soft.  Skin:    General: Skin is warm and dry.  Neurological:     Mental Status: He is alert and oriented to person, place, and time.         Latest Ref Rng & Units 07/21/2022    8:52 AM  CMP  Glucose 70 - 99 mg/dL 88   BUN 8 - 23 mg/dL 19   Creatinine 0.61 - 1.24 mg/dL 0.87   Sodium 135 - 145 mmol/L 137   Potassium 3.5 - 5.1 mmol/L 4.3   Chloride 98 - 111 mmol/L 110   CO2 22 - 32 mmol/L 24   Calcium 8.9 - 10.3 mg/dL 8.2   Total Protein 6.5 - 8.1 g/dL 5.7   Total Bilirubin 0.3 - 1.2 mg/dL 0.7   Alkaline Phos 38 - 126 U/L 65   AST 15 - 41 U/L 20   ALT 0 - 44 U/L 15       Latest Ref Rng & Units 07/21/2022    8:52 AM  CBC  WBC 4.0 - 10.5 K/uL 3.5   Hemoglobin 13.0 - 17.0 g/dL 9.7   Hematocrit 39.0 - 52.0 % 29.7   Platelets 150 - 400 K/uL 74      Assessment and plan- Patient is a 87 y.o. 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  He is here for on  treatment assessment prior to cycle 5 of weekly CarboTaxol chemotherapy  Platelet counts are less than 100 today.  I am therefore holding CarboTaxol chemotherapy today.  This is despite reducing carboplatin AUC to 1.5.  He will proceed with radiation this week as planned.  He will directly proceed with cycle 5 of CarboTaxol chemotherapy next week and I will see him back in 2 weeks for cycle 6 which would likely be his last cycle of chemotherapy.  Following that I will plan to get repeat scans and have a discussion about maintenance immunotherapy   Visit Diagnosis 1. Primary malignant neoplasm of left upper lobe of lung ([place])   2. Chemotherapy-induced thrombocytopenia   3. Encounter for antineoplastic chemotherapy      Dr. Randa Evens, MD, MPH [facility] [phone] 07/21/2022 4:31 PM

## 2022-07-22 ENCOUNTER — Ambulatory Visit: Admission: RE | Admit: 2022-07-22 | Payer: Medicare HMO | Source: Ambulatory Visit

## 2022-07-22 ENCOUNTER — Ambulatory Visit: Payer: Medicare HMO

## 2022-07-23 ENCOUNTER — Ambulatory Visit: Payer: Medicare HMO

## 2022-07-24 ENCOUNTER — Ambulatory Visit: Payer: Medicare HMO

## 2022-07-25 ENCOUNTER — Ambulatory Visit: Payer: Medicare HMO

## 2022-07-25 MED FILL — Dexamethasone Sodium Phosphate Inj 100 MG/10ML: INTRAMUSCULAR | Qty: 1 | Status: AC

## 2022-07-28 ENCOUNTER — Ambulatory Visit: Payer: Medicare HMO | Admitting: Oncology

## 2022-07-28 ENCOUNTER — Ambulatory Visit: Payer: Medicare HMO

## 2022-07-28 ENCOUNTER — Inpatient Hospital Stay: Payer: Medicare HMO

## 2022-07-28 ENCOUNTER — Telehealth: Payer: Self-pay | Admitting: *Deleted

## 2022-07-28 ENCOUNTER — Other Ambulatory Visit: Payer: Self-pay | Admitting: Oncology

## 2022-07-28 VITALS — BP 136/73 | HR 84 | Temp 98.2°F | Resp 16

## 2022-07-28 DIAGNOSIS — N400 Enlarged prostate without lower urinary tract symptoms: Secondary | ICD-10-CM | POA: Insufficient documentation

## 2022-07-28 DIAGNOSIS — N281 Cyst of kidney, acquired: Secondary | ICD-10-CM | POA: Insufficient documentation

## 2022-07-28 DIAGNOSIS — Z5189 Encounter for other specified aftercare: Secondary | ICD-10-CM | POA: Insufficient documentation

## 2022-07-28 DIAGNOSIS — C3412 Malignant neoplasm of upper lobe, left bronchus or lung: Secondary | ICD-10-CM | POA: Insufficient documentation

## 2022-07-28 DIAGNOSIS — Z87891 Personal history of nicotine dependence: Secondary | ICD-10-CM | POA: Diagnosis not present

## 2022-07-28 DIAGNOSIS — Z5111 Encounter for antineoplastic chemotherapy: Secondary | ICD-10-CM | POA: Insufficient documentation

## 2022-07-28 DIAGNOSIS — C771 Secondary and unspecified malignant neoplasm of intrathoracic lymph nodes: Secondary | ICD-10-CM | POA: Insufficient documentation

## 2022-07-28 DIAGNOSIS — D6959 Other secondary thrombocytopenia: Secondary | ICD-10-CM | POA: Insufficient documentation

## 2022-07-28 DIAGNOSIS — Z452 Encounter for adjustment and management of vascular access device: Secondary | ICD-10-CM | POA: Diagnosis not present

## 2022-07-28 DIAGNOSIS — D701 Agranulocytosis secondary to cancer chemotherapy: Secondary | ICD-10-CM | POA: Diagnosis not present

## 2022-07-28 DIAGNOSIS — Z510 Encounter for antineoplastic radiation therapy: Secondary | ICD-10-CM | POA: Diagnosis not present

## 2022-07-28 LAB — CBC WITH DIFFERENTIAL/PLATELET
Abs Immature Granulocytes: 0.01 10*3/uL (ref 0.00–0.07)
Basophils Absolute: 0 10*3/uL (ref 0.0–0.1)
Basophils Relative: 1 %
Eosinophils Absolute: 0.1 10*3/uL (ref 0.0–0.5)
Eosinophils Relative: 5 %
HCT: 30.2 % — ABNORMAL LOW (ref 39.0–52.0)
Hemoglobin: 10 g/dL — ABNORMAL LOW (ref 13.0–17.0)
Immature Granulocytes: 0 %
Lymphocytes Relative: 18 %
Lymphs Abs: 0.5 10*3/uL — ABNORMAL LOW (ref 0.7–4.0)
MCH: 32.8 pg (ref 26.0–34.0)
MCHC: 33.1 g/dL (ref 30.0–36.0)
MCV: 99 fL (ref 80.0–100.0)
Monocytes Absolute: 0.4 10*3/uL (ref 0.1–1.0)
Monocytes Relative: 15 %
Neutro Abs: 1.7 10*3/uL (ref 1.7–7.7)
Neutrophils Relative %: 61 %
Platelets: 112 10*3/uL — ABNORMAL LOW (ref 150–400)
RBC: 3.05 MIL/uL — ABNORMAL LOW (ref 4.22–5.81)
RDW: 17.4 % — ABNORMAL HIGH (ref 11.5–15.5)
WBC: 2.8 10*3/uL — ABNORMAL LOW (ref 4.0–10.5)
nRBC: 0 % (ref 0.0–0.2)

## 2022-07-28 LAB — COMPREHENSIVE METABOLIC PANEL
ALT: 17 U/L (ref 0–44)
AST: 23 U/L (ref 15–41)
Albumin: 3.2 g/dL — ABNORMAL LOW (ref 3.5–5.0)
Alkaline Phosphatase: 60 U/L (ref 38–126)
Anion gap: 7 (ref 5–15)
BUN: 24 mg/dL — ABNORMAL HIGH (ref 8–23)
CO2: 23 mmol/L (ref 22–32)
Calcium: 8.5 mg/dL — ABNORMAL LOW (ref 8.9–10.3)
Chloride: 110 mmol/L (ref 98–111)
Creatinine, Ser: 1.01 mg/dL (ref 0.61–1.24)
GFR, Estimated: >60 mL/min (ref >60)
Glucose, Bld: 115 mg/dL — ABNORMAL HIGH (ref 70–99)
Potassium: 3.9 mmol/L (ref 3.5–5.1)
Sodium: 140 mmol/L (ref 135–145)
Total Bilirubin: 0.8 mg/dL (ref 0.3–1.2)
Total Protein: 5.8 g/dL — ABNORMAL LOW (ref 6.5–8.1)

## 2022-07-28 MED ORDER — SODIUM CHLORIDE 0.9 % IV SOLN
Freq: Once | INTRAVENOUS | Status: AC
  Filled 2022-07-28: qty 250

## 2022-07-28 MED ORDER — SODIUM CHLORIDE 0.9 % IV SOLN
118.6500 mg | Freq: Once | INTRAVENOUS | Status: AC
  Administered 2022-07-28: 120 mg via INTRAVENOUS
  Filled 2022-07-28: qty 12

## 2022-07-28 MED ORDER — FAMOTIDINE IN NACL 20-0.9 MG/50ML-% IV SOLN
20.0000 mg | Freq: Once | INTRAVENOUS | Status: AC
  Administered 2022-07-28: 20 mg via INTRAVENOUS
  Filled 2022-07-28: qty 50

## 2022-07-28 MED ORDER — SODIUM CHLORIDE 0.9 % IV SOLN
10.0000 mg | Freq: Once | INTRAVENOUS | Status: AC
  Administered 2022-07-28: 10 mg via INTRAVENOUS
  Filled 2022-07-28: qty 10
  Filled 2022-07-28: qty 1

## 2022-07-28 MED ORDER — SODIUM CHLORIDE 0.9 % IV SOLN
45.0000 mg/m2 | Freq: Once | INTRAVENOUS | Status: AC
  Administered 2022-07-28: 84 mg via INTRAVENOUS
  Filled 2022-07-28: qty 14

## 2022-07-28 MED ORDER — SODIUM CHLORIDE 0.9% FLUSH
10.0000 mL | INTRAVENOUS | Status: DC | PRN
  Administered 2022-07-28: 10 mL
  Filled 2022-07-28: qty 10

## 2022-07-28 MED ORDER — DIPHENHYDRAMINE HCL 50 MG/ML IJ SOLN
50.0000 mg | Freq: Once | INTRAMUSCULAR | Status: AC
  Administered 2022-07-28: 50 mg via INTRAVENOUS
  Filled 2022-07-28: qty 1

## 2022-07-28 MED ORDER — PALONOSETRON HCL INJECTION 0.25 MG/5ML
0.2500 mg | Freq: Once | INTRAVENOUS | Status: AC
  Administered 2022-07-28: 0.25 mg via INTRAVENOUS
  Filled 2022-07-28: qty 5

## 2022-07-28 MED ORDER — HEPARIN SOD (PORK) LOCK FLUSH 100 UNIT/ML IV SOLN
500.0000 [IU] | Freq: Once | INTRAVENOUS | Status: AC | PRN
  Administered 2022-07-28: 500 [IU]
  Filled 2022-07-28: qty 5

## 2022-07-28 NOTE — Telephone Encounter (Signed)
Pt called about the zarxio inj. I told him that each time he gets treatment. If the wbc is not high enough then he has to get the shots to get wbc from getting worse.each treatment we check the labs and if you need the shots it is always 3 in a row. I spoke to radiation and they moved the appt 12/5 to 1:45 and then after that he will get inj. 2 pm/ pt aware and will be here at times above

## 2022-07-28 NOTE — Patient Instructions (Signed)
[facility] AT [place]  Discharge Instructions: Thank you for choosing [facility] to provide your oncology and hematology care.  If you have a lab appointment with the [facility], please go directly to the [facility] and check in at the registration area.  Wear comfortable clothing and clothing appropriate for easy access to any Portacath or PICC line.   We strive to give you quality time with your provider. You may need to reschedule your appointment if you arrive late (15 or more minutes).  Arriving late affects you and other patients whose appointments are after yours.  Also, if you miss three or more appointments without notifying the office, you may be dismissed from the clinic at the provider's discretion.      For prescription refill requests, have your pharmacy contact our office and allow 72 hours for refills to be completed.    Today you received the following chemotherapy and/or immunotherapy agents Paraplatin & Taxol      To help prevent nausea and vomiting after your treatment, we encourage you to take your nausea medication as directed.  BELOW ARE SYMPTOMS THAT SHOULD BE REPORTED IMMEDIATELY: *FEVER GREATER THAN 100.4 F (38 C) OR HIGHER *CHILLS OR SWEATING *NAUSEA AND VOMITING THAT IS NOT CONTROLLED WITH YOUR NAUSEA MEDICATION *UNUSUAL SHORTNESS OF BREATH *UNUSUAL BRUISING OR BLEEDING *URINARY PROBLEMS (pain or burning when urinating, or frequent urination) *BOWEL PROBLEMS (unusual diarrhea, constipation, pain near the anus) TENDERNESS IN MOUTH AND THROAT WITH OR WITHOUT PRESENCE OF ULCERS (sore throat, sores in mouth, or a toothache) UNUSUAL RASH, SWELLING OR PAIN  UNUSUAL VAGINAL DISCHARGE OR ITCHING   Items with * indicate a potential emergency and should be followed up as soon as possible or go to the Emergency Department if any problems should occur.  Please show the CHEMOTHERAPY ALERT CARD or IMMUNOTHERAPY ALERT CARD at  check-in to the Emergency Department and triage nurse.  Should you have questions after your visit or need to cancel or reschedule your appointment, please contact [facility] [place] [facility] [place]  [phone] and follow the prompts.  Office hours are 8:00 a.m. to 4:30 p.m. Monday - Friday. Please note that voicemails left after 4:00 p.m. may not be returned until the following business day.  We are closed weekends and major holidays. You have access to a nurse at all times for urgent questions. Please call the main number to the clinic [phone] and follow the prompts.  For any non-urgent questions, you may also contact your provider using MyChart. We now offer e-Visits for anyone 85 and older to request care online for non-urgent symptoms. For details visit [email or web address].   Also download the MyChart app! Go to the app store, search "MyChart", open the app, select [place], and log in with your MyChart username and password.  Masks are optional in the cancer centers. If you would like for your care team to wear a mask while they are taking care of you, please let them know. For doctor visits, patients may have with them one support person who is at least 85 years old. At this time, visitors are not allowed in the infusion area.

## 2022-07-29 ENCOUNTER — Inpatient Hospital Stay: Payer: Medicare HMO

## 2022-07-29 ENCOUNTER — Ambulatory Visit
Admission: RE | Admit: 2022-07-29 | Discharge: 2022-07-29 | Disposition: A | Discharge status: Home / Self Care | Payer: Medicare HMO | Source: Ambulatory Visit | Attending: Radiation Oncology | Admitting: Radiation Oncology

## 2022-07-29 ENCOUNTER — Ambulatory Visit: Payer: Medicare HMO

## 2022-07-29 ENCOUNTER — Other Ambulatory Visit: Payer: Self-pay | Admitting: *Deleted

## 2022-07-29 ENCOUNTER — Other Ambulatory Visit: Payer: Self-pay

## 2022-07-29 DIAGNOSIS — D701 Agranulocytosis secondary to cancer chemotherapy: Secondary | ICD-10-CM | POA: Diagnosis not present

## 2022-07-29 DIAGNOSIS — C771 Secondary and unspecified malignant neoplasm of intrathoracic lymph nodes: Secondary | ICD-10-CM | POA: Diagnosis not present

## 2022-07-29 DIAGNOSIS — Z87891 Personal history of nicotine dependence: Secondary | ICD-10-CM | POA: Diagnosis not present

## 2022-07-29 DIAGNOSIS — D6959 Other secondary thrombocytopenia: Secondary | ICD-10-CM | POA: Diagnosis not present

## 2022-07-29 DIAGNOSIS — Z452 Encounter for adjustment and management of vascular access device: Secondary | ICD-10-CM | POA: Diagnosis not present

## 2022-07-29 DIAGNOSIS — C3412 Malignant neoplasm of upper lobe, left bronchus or lung: Secondary | ICD-10-CM | POA: Insufficient documentation

## 2022-07-29 DIAGNOSIS — N281 Cyst of kidney, acquired: Secondary | ICD-10-CM | POA: Insufficient documentation

## 2022-07-29 DIAGNOSIS — Z510 Encounter for antineoplastic radiation therapy: Secondary | ICD-10-CM | POA: Insufficient documentation

## 2022-07-29 DIAGNOSIS — N400 Enlarged prostate without lower urinary tract symptoms: Secondary | ICD-10-CM | POA: Insufficient documentation

## 2022-07-29 LAB — RAD ONC ARIA SESSION SUMMARY
Course Elapsed Days: 40
Plan Fractions Treated to Date: 22
Plan Prescribed Dose Per Fraction: 2 Gy
Plan Total Fractions Prescribed: 35
Plan Total Prescribed Dose: 70 Gy
Reference Point Dosage Given to Date: 44 Gy
Reference Point Session Dosage Given: 2 Gy
Session Number: 22

## 2022-07-29 MED ORDER — FILGRASTIM-SNDZ 480 MCG/0.8ML IJ SOSY
480.0000 ug | PREFILLED_SYRINGE | Freq: Every day | INTRAMUSCULAR | Status: DC
  Administered 2022-07-29: 480 ug via SUBCUTANEOUS
  Filled 2022-07-29: qty 0.8

## 2022-07-30 ENCOUNTER — Inpatient Hospital Stay: Payer: Medicare HMO

## 2022-07-30 ENCOUNTER — Ambulatory Visit (INDEPENDENT_AMBULATORY_CARE_PROVIDER_SITE_OTHER): Payer: Medicare HMO | Admitting: Physician Assistant

## 2022-07-30 ENCOUNTER — Other Ambulatory Visit: Payer: Self-pay

## 2022-07-30 ENCOUNTER — Ambulatory Visit
Admission: RE | Admit: 2022-07-30 | Discharge: 2022-07-30 | Disposition: A | Discharge status: Home / Self Care | Payer: Medicare HMO | Source: Ambulatory Visit | Attending: Radiation Oncology | Admitting: Radiation Oncology

## 2022-07-30 DIAGNOSIS — D6959 Other secondary thrombocytopenia: Secondary | ICD-10-CM | POA: Diagnosis not present

## 2022-07-30 DIAGNOSIS — I480 Paroxysmal atrial fibrillation: Secondary | ICD-10-CM

## 2022-07-30 DIAGNOSIS — N400 Enlarged prostate without lower urinary tract symptoms: Secondary | ICD-10-CM | POA: Diagnosis not present

## 2022-07-30 DIAGNOSIS — Z510 Encounter for antineoplastic radiation therapy: Secondary | ICD-10-CM | POA: Diagnosis not present

## 2022-07-30 DIAGNOSIS — C771 Secondary and unspecified malignant neoplasm of intrathoracic lymph nodes: Secondary | ICD-10-CM | POA: Diagnosis not present

## 2022-07-30 DIAGNOSIS — D701 Agranulocytosis secondary to cancer chemotherapy: Secondary | ICD-10-CM | POA: Diagnosis not present

## 2022-07-30 DIAGNOSIS — Z452 Encounter for adjustment and management of vascular access device: Secondary | ICD-10-CM | POA: Diagnosis not present

## 2022-07-30 DIAGNOSIS — Z87891 Personal history of nicotine dependence: Secondary | ICD-10-CM | POA: Diagnosis not present

## 2022-07-30 DIAGNOSIS — C3412 Malignant neoplasm of upper lobe, left bronchus or lung: Secondary | ICD-10-CM

## 2022-07-30 DIAGNOSIS — N281 Cyst of kidney, acquired: Secondary | ICD-10-CM | POA: Diagnosis not present

## 2022-07-30 LAB — RAD ONC ARIA SESSION SUMMARY
Course Elapsed Days: 41
Plan Fractions Treated to Date: 23
Plan Prescribed Dose Per Fraction: 2 Gy
Plan Total Fractions Prescribed: 35
Plan Total Prescribed Dose: 70 Gy
Reference Point Dosage Given to Date: 46 Gy
Reference Point Session Dosage Given: 2 Gy
Session Number: 23

## 2022-07-30 LAB — POCT INR
INR: 5 — AB (ref 2.0–3.0)
POC INR: 5
PT: 60.4

## 2022-07-30 MED ORDER — FILGRASTIM-SNDZ 480 MCG/0.8ML IJ SOSY
480.0000 ug | PREFILLED_SYRINGE | Freq: Every day | INTRAMUSCULAR | Status: DC
  Administered 2022-07-30: 480 ug via SUBCUTANEOUS
  Filled 2022-07-30: qty 0.8

## 2022-07-30 NOTE — Patient Instructions (Signed)
Description   Hold 2 days then 4 mg daily. F/U 2 weeks.

## 2022-07-31 ENCOUNTER — Ambulatory Visit
Admission: RE | Admit: 2022-07-31 | Discharge: 2022-07-31 | Disposition: A | Discharge status: Home / Self Care | Payer: Medicare HMO | Source: Ambulatory Visit | Attending: Radiation Oncology | Admitting: Radiation Oncology

## 2022-07-31 ENCOUNTER — Other Ambulatory Visit: Payer: Self-pay

## 2022-07-31 ENCOUNTER — Inpatient Hospital Stay: Payer: Medicare HMO

## 2022-07-31 DIAGNOSIS — C3412 Malignant neoplasm of upper lobe, left bronchus or lung: Secondary | ICD-10-CM

## 2022-07-31 DIAGNOSIS — C771 Secondary and unspecified malignant neoplasm of intrathoracic lymph nodes: Secondary | ICD-10-CM | POA: Diagnosis not present

## 2022-07-31 DIAGNOSIS — N400 Enlarged prostate without lower urinary tract symptoms: Secondary | ICD-10-CM | POA: Diagnosis not present

## 2022-07-31 DIAGNOSIS — D701 Agranulocytosis secondary to cancer chemotherapy: Secondary | ICD-10-CM | POA: Diagnosis not present

## 2022-07-31 DIAGNOSIS — D6959 Other secondary thrombocytopenia: Secondary | ICD-10-CM | POA: Diagnosis not present

## 2022-07-31 DIAGNOSIS — N281 Cyst of kidney, acquired: Secondary | ICD-10-CM | POA: Diagnosis not present

## 2022-07-31 DIAGNOSIS — Z452 Encounter for adjustment and management of vascular access device: Secondary | ICD-10-CM | POA: Diagnosis not present

## 2022-07-31 DIAGNOSIS — Z87891 Personal history of nicotine dependence: Secondary | ICD-10-CM | POA: Diagnosis not present

## 2022-07-31 DIAGNOSIS — Z510 Encounter for antineoplastic radiation therapy: Secondary | ICD-10-CM | POA: Diagnosis not present

## 2022-07-31 LAB — RAD ONC ARIA SESSION SUMMARY
Course Elapsed Days: 42
Plan Fractions Treated to Date: 24
Plan Prescribed Dose Per Fraction: 2 Gy
Plan Total Fractions Prescribed: 35
Plan Total Prescribed Dose: 70 Gy
Reference Point Dosage Given to Date: 48 Gy
Reference Point Session Dosage Given: 2 Gy
Session Number: 24

## 2022-07-31 MED ORDER — FILGRASTIM-SNDZ 480 MCG/0.8ML IJ SOSY
480.0000 ug | PREFILLED_SYRINGE | Freq: Every day | INTRAMUSCULAR | Status: DC
  Administered 2022-07-31: 480 ug via SUBCUTANEOUS
  Filled 2022-07-31: qty 0.8

## 2022-08-01 ENCOUNTER — Ambulatory Visit
Admission: RE | Admit: 2022-08-01 | Discharge: 2022-08-01 | Disposition: A | Discharge status: Home / Self Care | Payer: Medicare HMO | Source: Ambulatory Visit | Attending: Radiation Oncology | Admitting: Radiation Oncology

## 2022-08-01 ENCOUNTER — Other Ambulatory Visit: Payer: Self-pay

## 2022-08-01 DIAGNOSIS — D6959 Other secondary thrombocytopenia: Secondary | ICD-10-CM | POA: Diagnosis not present

## 2022-08-01 DIAGNOSIS — N281 Cyst of kidney, acquired: Secondary | ICD-10-CM | POA: Diagnosis not present

## 2022-08-01 DIAGNOSIS — C3412 Malignant neoplasm of upper lobe, left bronchus or lung: Secondary | ICD-10-CM | POA: Diagnosis not present

## 2022-08-01 DIAGNOSIS — Z510 Encounter for antineoplastic radiation therapy: Secondary | ICD-10-CM | POA: Diagnosis not present

## 2022-08-01 DIAGNOSIS — D701 Agranulocytosis secondary to cancer chemotherapy: Secondary | ICD-10-CM | POA: Diagnosis not present

## 2022-08-01 DIAGNOSIS — N400 Enlarged prostate without lower urinary tract symptoms: Secondary | ICD-10-CM | POA: Diagnosis not present

## 2022-08-01 DIAGNOSIS — Z87891 Personal history of nicotine dependence: Secondary | ICD-10-CM | POA: Diagnosis not present

## 2022-08-01 DIAGNOSIS — C771 Secondary and unspecified malignant neoplasm of intrathoracic lymph nodes: Secondary | ICD-10-CM | POA: Diagnosis not present

## 2022-08-01 DIAGNOSIS — Z452 Encounter for adjustment and management of vascular access device: Secondary | ICD-10-CM | POA: Diagnosis not present

## 2022-08-01 LAB — RAD ONC ARIA SESSION SUMMARY
Course Elapsed Days: 43
Plan Fractions Treated to Date: 25
Plan Prescribed Dose Per Fraction: 2 Gy
Plan Total Fractions Prescribed: 35
Plan Total Prescribed Dose: 70 Gy
Reference Point Dosage Given to Date: 50 Gy
Reference Point Session Dosage Given: 2 Gy
Session Number: 25

## 2022-08-01 MED FILL — Dexamethasone Sodium Phosphate Inj 100 MG/10ML: INTRAMUSCULAR | Qty: 1 | Status: AC

## 2022-08-04 ENCOUNTER — Other Ambulatory Visit: Payer: Self-pay

## 2022-08-04 ENCOUNTER — Inpatient Hospital Stay: Payer: Medicare HMO

## 2022-08-04 ENCOUNTER — Ambulatory Visit
Admission: RE | Admit: 2022-08-04 | Discharge: 2022-08-04 | Disposition: A | Discharge status: Home / Self Care | Payer: Medicare HMO | Source: Ambulatory Visit | Attending: Radiation Oncology | Admitting: Radiation Oncology

## 2022-08-04 ENCOUNTER — Inpatient Hospital Stay (HOSPITAL_BASED_OUTPATIENT_CLINIC_OR_DEPARTMENT_OTHER): Payer: Medicare HMO | Admitting: Oncology

## 2022-08-04 ENCOUNTER — Encounter: Payer: Self-pay | Admitting: Oncology

## 2022-08-04 VITALS — BP 156/94 | HR 73 | Temp 97.0°F | Resp 18 | Ht 69.0 in | Wt 158.0 lb

## 2022-08-04 DIAGNOSIS — Z5111 Encounter for antineoplastic chemotherapy: Secondary | ICD-10-CM

## 2022-08-04 DIAGNOSIS — C3412 Malignant neoplasm of upper lobe, left bronchus or lung: Secondary | ICD-10-CM

## 2022-08-04 DIAGNOSIS — D6959 Other secondary thrombocytopenia: Secondary | ICD-10-CM | POA: Diagnosis not present

## 2022-08-04 DIAGNOSIS — N281 Cyst of kidney, acquired: Secondary | ICD-10-CM | POA: Diagnosis not present

## 2022-08-04 DIAGNOSIS — D701 Agranulocytosis secondary to cancer chemotherapy: Secondary | ICD-10-CM | POA: Diagnosis not present

## 2022-08-04 DIAGNOSIS — N400 Enlarged prostate without lower urinary tract symptoms: Secondary | ICD-10-CM | POA: Diagnosis not present

## 2022-08-04 DIAGNOSIS — Z87891 Personal history of nicotine dependence: Secondary | ICD-10-CM | POA: Diagnosis not present

## 2022-08-04 DIAGNOSIS — Z452 Encounter for adjustment and management of vascular access device: Secondary | ICD-10-CM | POA: Diagnosis not present

## 2022-08-04 DIAGNOSIS — C771 Secondary and unspecified malignant neoplasm of intrathoracic lymph nodes: Secondary | ICD-10-CM | POA: Diagnosis not present

## 2022-08-04 DIAGNOSIS — Z510 Encounter for antineoplastic radiation therapy: Secondary | ICD-10-CM | POA: Diagnosis not present

## 2022-08-04 LAB — RAD ONC ARIA SESSION SUMMARY
Course Elapsed Days: 46
Plan Fractions Treated to Date: 26
Plan Prescribed Dose Per Fraction: 2 Gy
Plan Total Fractions Prescribed: 35
Plan Total Prescribed Dose: 70 Gy
Reference Point Dosage Given to Date: 52 Gy
Reference Point Session Dosage Given: 2 Gy
Session Number: 26

## 2022-08-04 LAB — CBC WITH DIFFERENTIAL/PLATELET
Abs Immature Granulocytes: 0.47 10*3/uL — ABNORMAL HIGH (ref 0.00–0.07)
Basophils Absolute: 0.1 10*3/uL (ref 0.0–0.1)
Basophils Relative: 2 %
Eosinophils Absolute: 0.3 10*3/uL (ref 0.0–0.5)
Eosinophils Relative: 5 %
HCT: 30 % — ABNORMAL LOW (ref 39.0–52.0)
Hemoglobin: 10.1 g/dL — ABNORMAL LOW (ref 13.0–17.0)
Immature Granulocytes: 9 %
Lymphocytes Relative: 12 %
Lymphs Abs: 0.6 10*3/uL — ABNORMAL LOW (ref 0.7–4.0)
MCH: 33.8 pg (ref 26.0–34.0)
MCHC: 33.7 g/dL (ref 30.0–36.0)
MCV: 100.3 fL — ABNORMAL HIGH (ref 80.0–100.0)
Monocytes Absolute: 0.6 10*3/uL (ref 0.1–1.0)
Monocytes Relative: 13 %
Neutro Abs: 2.9 10*3/uL (ref 1.7–7.7)
Neutrophils Relative %: 59 %
Platelets: 153 10*3/uL (ref 150–400)
RBC: 2.99 MIL/uL — ABNORMAL LOW (ref 4.22–5.81)
RDW: 18.1 % — ABNORMAL HIGH (ref 11.5–15.5)
WBC: 5 10*3/uL (ref 4.0–10.5)
nRBC: 0 % (ref 0.0–0.2)

## 2022-08-04 LAB — COMPREHENSIVE METABOLIC PANEL
ALT: 17 U/L (ref 0–44)
AST: 20 U/L (ref 15–41)
Albumin: 3.2 g/dL — ABNORMAL LOW (ref 3.5–5.0)
Alkaline Phosphatase: 63 U/L (ref 38–126)
Anion gap: 8 (ref 5–15)
BUN: 25 mg/dL — ABNORMAL HIGH (ref 8–23)
CO2: 24 mmol/L (ref 22–32)
Calcium: 8.1 mg/dL — ABNORMAL LOW (ref 8.9–10.3)
Chloride: 108 mmol/L (ref 98–111)
Creatinine, Ser: 0.94 mg/dL (ref 0.61–1.24)
GFR, Estimated: >60 mL/min (ref >60)
Glucose, Bld: 118 mg/dL — ABNORMAL HIGH (ref 70–99)
Potassium: 4 mmol/L (ref 3.5–5.1)
Sodium: 140 mmol/L (ref 135–145)
Total Bilirubin: 0.6 mg/dL (ref 0.3–1.2)
Total Protein: 5.8 g/dL — ABNORMAL LOW (ref 6.5–8.1)

## 2022-08-04 MED ORDER — HEPARIN SOD (PORK) LOCK FLUSH 100 UNIT/ML IV SOLN
500.0000 [IU] | Freq: Once | INTRAVENOUS | Status: AC | PRN
  Administered 2022-08-04: 500 [IU]
  Filled 2022-08-04: qty 5

## 2022-08-04 MED ORDER — FAMOTIDINE IN NACL 20-0.9 MG/50ML-% IV SOLN
20.0000 mg | Freq: Once | INTRAVENOUS | Status: AC
  Administered 2022-08-04: 20 mg via INTRAVENOUS
  Filled 2022-08-04: qty 50

## 2022-08-04 MED ORDER — DIPHENHYDRAMINE HCL 50 MG/ML IJ SOLN
50.0000 mg | Freq: Once | INTRAMUSCULAR | Status: AC
  Administered 2022-08-04: 50 mg via INTRAVENOUS
  Filled 2022-08-04: qty 1

## 2022-08-04 MED ORDER — HEPARIN SOD (PORK) LOCK FLUSH 100 UNIT/ML IV SOLN
INTRAVENOUS | Status: AC
  Filled 2022-08-04: qty 5

## 2022-08-04 MED ORDER — SODIUM CHLORIDE 0.9 % IV SOLN
119.4000 mg | Freq: Once | INTRAVENOUS | Status: AC
  Administered 2022-08-04: 120 mg via INTRAVENOUS
  Filled 2022-08-04: qty 12

## 2022-08-04 MED ORDER — SODIUM CHLORIDE 0.9 % IV SOLN
45.0000 mg/m2 | Freq: Once | INTRAVENOUS | Status: AC
  Administered 2022-08-04: 84 mg via INTRAVENOUS
  Filled 2022-08-04: qty 14

## 2022-08-04 MED ORDER — PALONOSETRON HCL INJECTION 0.25 MG/5ML
0.2500 mg | Freq: Once | INTRAVENOUS | Status: AC
  Administered 2022-08-04: 0.25 mg via INTRAVENOUS
  Filled 2022-08-04: qty 5

## 2022-08-04 MED ORDER — SODIUM CHLORIDE 0.9 % IV SOLN
Freq: Once | INTRAVENOUS | Status: AC
  Filled 2022-08-04: qty 250

## 2022-08-04 MED ORDER — SODIUM CHLORIDE 0.9 % IV SOLN
10.0000 mg | Freq: Once | INTRAVENOUS | Status: AC
  Administered 2022-08-04: 10 mg via INTRAVENOUS
  Filled 2022-08-04: qty 10

## 2022-08-04 MED ORDER — SODIUM CHLORIDE 0.9% FLUSH
10.0000 mL | INTRAVENOUS | Status: DC | PRN
  Administered 2022-08-04: 10 mL
  Filled 2022-08-04: qty 10

## 2022-08-04 NOTE — Progress Notes (Signed)
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  He was found to have a left upper lobe lung mass measuring 5.5 x 3.7 x 2.8 cm.  It was confluent with the left hilar lymph node.  Enlarged AP window lymph node measuring 16 mm.  Enlarged left paratracheal lymph node.  Low-attenuation 7 mm lesion in the liver.  7 mm cyst in the uncinate process of the pancreas for which follow-up was not recommended.  Bilateral kidney cysts.  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  Prostatic enlargement.   PET CT is can showed hypermetabolic left upper lobe lung mass measuring 5.4 x 3.9 cm with an SUV of 15.1.  Ipsilateral mediastinal lymph node metastases with index lymph node measuring 1.7 with an SUV of 9.8.  No evidence of distant metastatic disease.  Biopsy of the left upper lobe as well as station 4R lymph node was consistent with squamous cell carcinoma   Concurrent chemoradiation with weekly CarboTaxol was recommended followed by consideration for maintenance durvalumab.NGS  testing did not show any actionable mutations.  PD-L1 35%.  MSI stable.  MRI brain was negative for metastatic disease  Interval history-tolerating treatments well so far.  Denies any specific complaints at this time.  He will be finishing radiation treatments next week  ECOG PS- 1 Pain scale- 0   Review of systems- Review of Systems  Constitutional:  Negative for chills, fever, malaise/fatigue and weight loss.  HENT:  Negative for congestion, ear discharge and nosebleeds.   Eyes:  Negative for blurred vision.  Respiratory:  Negative for cough, hemoptysis, sputum production, shortness of breath and wheezing.   Cardiovascular:  Negative for chest pain, palpitations, orthopnea and claudication.  Gastrointestinal:  Negative for abdominal pain, blood in stool, constipation, diarrhea, heartburn, melena, nausea and vomiting.  Genitourinary:  Negative for dysuria, flank pain, frequency, hematuria and urgency.  Musculoskeletal:  Negative for back pain, joint pain and myalgias.  Skin:  Negative for rash.  Neurological:  Negative for dizziness, tingling, focal weakness, seizures, weakness and headaches.  Endo/Heme/Allergies:  Does not bruise/bleed easily.  Psychiatric/Behavioral:  Negative for depression and suicidal ideas. The patient does not have insomnia.       Allergies  Allergen Reactions   Cefdinir Nausea Only    hypersensitivity to smell   Doxycycline     Sun sensitivity   Prednisone     Hallucinations    Sertraline Other (See Comments)    Hallucinations      Past Medical History:  Diagnosis Date   A-fib ([place])    a.) CHA2DS2-VASc = 6 (age x2, HTN, DVT x2, vascular disease history). b.) rate/rhythm maintained on oral carvedilol; chronically anticoagulated  using daily warfarin.   Actinic keratosis    Anemia    Aortic atherosclerosis (HCC)    Bilateral renal cysts    a.)  CT abdomen pelvis 04/24/2022: Enhancing septation of the lateral aspect of the mid RIGHT kidney measuring  2.7 x 2.8 cm concerning for cystic renal neoplasm   Bladder calculi    BPH (benign prostatic hyperplasia)    DDD (degenerative disc disease), lumbar    a.) s/p LEFT laminectomy L5   Diverticulosis    DOE (dyspnea on exertion)    DVT (deep venous thrombosis) (HCC)    Elevated PSA    GERD (gastroesophageal reflux disease)    H/O degenerative disc disease    HLD (hyperlipidemia)    Hypertension    Lipoma of arm    Lipomatosis    Long term current use of anticoagulant    a.) warfarin   Lung cancer ([place])    Mass of upper lobe of left lung 04/24/2022   a.)  CT chest 04/24/2022: Multilobulated perihilar mass measuring 5.3 x 3.7 x 2.8 cm with associated LEFT hilar and mediastinal LAD; b.)  PET CT 19/37/9024: Hypermetabolic LEFT upper lobe lung mass (max SUV 15.1); ipsilateral nodal metastasis --> presuming a NSC histology, 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   NICM (nonischemic cardiomyopathy) ([place]) 11/22/2015   a.) TTE 11/22/2015: EF 40%. b.) TTE 12/27/2018: EF 45%. c.) TTE 08/30/2020: EF 45-50%.   Night sweats    Obesity    Osteoarthritis    RBBB (right bundle branch block)    Right inguinal hernia    VHD (valvular heart disease) 11/22/2015   a.) TTE 11/22/2015: EF 40%; mod MR, mod-sev TR, triv PR; mod BAE, mild RV enlargement. b.) TTE 12/27/2018: EF 45%; mod MR, mod-sev TR, triv PR; mod BAE, mild RV enlargement. c.) TTE 08/30/2020: EF 45-50%; mild MR/TR.     Past Surgical History:  Procedure Laterality Date   Bilateral Radical Keratotomy Bilateral 1997   COLONOSCOPY WITH PROPOFOL N/A 01/11/2016   Procedure: COLONOSCOPY WITH PROPOFOL;  Surgeon: Manya Silvas, MD;  [facility] END[facility]COPY;  Service: Endoscopy;  Laterality: N/A; repeat 3 years, tubular adenoma   DENTAL SURGERY     Patient had implants   FLEXIBLE BRONCHOSCOPY Left 05/19/2022   Procedure: FLEXIBLE BRONCHOSCOPY;  Surgeon: Tyler Pita, MD;  Location: [facility] ORS;   Service: Cardiopulmonary;  Laterality: Left;   GUM SURGERY     IR IMAGING GUIDED PORT INSERTION  06/13/2022   L4 - S1 decompression Left    PROSTATE BIOPSY     TONSILLECTOMY AND ADENOIDECTOMY  age 15   VIDEO BRONCHOSCOPY WITH ENDOBRONCHIAL ULTRASOUND Left 05/19/2022   Procedure: VIDEO BRONCHOSCOPY WITH ENDOBRONCHIAL ULTRASOUND;  Surgeon: Tyler Pita, MD;  Location: [facility] ORS;  Service: Cardiopulmonary;  Laterality: Left;   XI ROBOTIC ASSISTED INGUINAL HERNIA REPAIR WITH MESH Right 10/21/2021   Procedure: XI ROBOTIC ASSISTED INGUINAL HERNIA REPAIR WITH MESH;  Surgeon: Ronny Bacon, MD;  Location: [facility] ORS;  Service: General;  Laterality: Right;    Social History   Socioeconomic History   Marital status: Married    Spouse name: Doris   Number of children: 3   Years of education: Not on file   Highest education level: Associate degree: occupational, Hotel manager, or vocational program  Occupational History   Occupation: retired  Tobacco Use   Smoking status: Former    Types: Cigarettes    Quit date: 08/26/1971    Years  since quitting: 50.9    Passive exposure: Never   Smokeless tobacco: Never   Tobacco comments:    Smoked for about 20 years.  Vaping Use   Vaping Use: Never used  Substance and Sexual Activity   Alcohol use: Not Currently   Drug use: No   Sexual activity: Not Currently  Other Topics Concern   Not on file  Social History Narrative   Not on file   Social Determinants of Health   Financial Resource Strain: Low Risk  (12/18/2021)   Overall Financial Resource Strain (CARDIA)    Difficulty of Paying Living Expenses: Not hard at all  Food Insecurity: No Food Insecurity (12/18/2021)   Hunger Vital Sign    Worried About Running Out of Food in the Last Year: Never true    Ran Out of Food in the Last Year: Never true  Transportation Needs: No Transportation Needs (12/18/2021)   PRAPARE - Hydrologist (Medical): No    Lack of  Transportation (Non-Medical): No  Physical Activity: Sufficiently Active (12/18/2021)   Exercise Vital Sign    Days of Exercise per Week: 7 days    Minutes of Exercise per Session: 50 min  Stress: No Stress Concern Present (12/18/2021)   [place]    Feeling of Stress : Only a little  Social Connections: Moderately Integrated (12/18/2021)   Social Connection and Isolation Panel [NHANES]    Frequency of Communication with Friends and Family: Twice a week    Frequency of Social Gatherings with Friends and Family: Once a week    Attends Religious Services: More than 4 times per year    Active Member of [employer] or Organizations: No    Attends Archivist Meetings: Never    Marital Status: Married  Human resources officer Violence: Not At Risk (12/18/2021)   Humiliation, Afraid, Rape, and Kick questionnaire    Fear of Current or Ex-Partner: No    Emotionally Abused: No    Physically Abused: No    Sexually Abused: No    Family History  Problem Relation Age of Onset   Cancer Mother        secondary to cancinoma of the jaw from her dipping snuff.   Heart attack Father    CAD Father    Hypertension Sister    Diabetes Son        Type I diabetes   Prostate cancer Neg Hx    Kidney cancer Neg Hx      Current Outpatient Medications:    amLODipine (NORVASC) 5 MG tablet, Take 1 tablet (5 mg total) by mouth daily., Disp: 90 tablet, Rfl: 0   benazepril (LOTENSIN) 40 MG tablet, TAKE 1 TABLET EVERY DAY, Disp: 90 tablet, Rfl: 3   carvedilol (COREG) 6.25 MG tablet, Take 1 tablet (6.25 mg total) by mouth 2 (two) times daily., Disp: 180 tablet, Rfl: 0   Cyanocobalamin (VITAMIN B 12 PO), Take 1 capsule by mouth 2 (two) times daily. 1000 mcg each capsule, Disp: , Rfl:    dexamethasone (DECADRON) 4 MG tablet, Take 2 tablets daily for 2 days, start the day after chemotherapy. Take with food., Disp: 30 tablet, Rfl: 1   doxazosin (CARDURA)  8 MG tablet, Take 1 tablet (8 mg total) by mouth daily., Disp: 90 tablet, Rfl: 0   finasteride (PROSCAR) 5 MG tablet, Take 1 tablet (5 mg total) by mouth daily., Disp: 90 tablet, Rfl: 3  gabapentin (NEURONTIN) 300 MG capsule, TAKE 1 CAPSULE EVERY MORNING, 1 CAPSULE IN THE AFTERNOON AND 2 CAPSULES AT BEDTIME, Disp: 360 capsule, Rfl: 3   lidocaine-prilocaine (EMLA) cream, Apply to affected area once, Disp: 30 g, Rfl: 3   Magnesium 500 MG TABS, Take 500 mg by mouth daily., Disp: , Rfl:    Multiple Vitamin (MULTI-VITAMIN) tablet, Take 1 tablet by mouth daily. , Disp: , Rfl:    omeprazole (PRILOSEC) 20 MG capsule, Take 20 mg by mouth daily., Disp: , Rfl:    ondansetron (ZOFRAN) 8 MG tablet, Take 1 tablet (8 mg total) by mouth every 8 (eight) hours as needed for nausea or vomiting. Start on the third day after chemotherapy., Disp: 30 tablet, Rfl: 1   oxyCODONE (OXY IR/ROXICODONE) 5 MG immediate release tablet, Take by mouth., Disp: , Rfl:    prochlorperazine (COMPAZINE) 10 MG tablet, Take 1 tablet (10 mg total) by mouth every 6 (six) hours as needed for nausea or vomiting., Disp: 30 tablet, Rfl: 1   triamterene-hydrochlorothiazide (MAXZIDE-25) 37.5-25 MG tablet, TAKE 1/2 TABLET EVERY DAY, Disp: 45 tablet, Rfl: 0   warfarin (COUMADIN) 4 MG tablet, TAKE 1 TABLET EVERY DAY IN THE AFTERNOON (Patient taking differently: 5 mg,  Mon, Weds, Fri, 4 mg the rest of the week), Disp: 90 tablet, Rfl: 0 No current facility-administered medications for this visit.  Facility-Administered Medications Ordered in Other Visits:    CARBOplatin (PARAPLATIN) 120 mg in sodium chloride 0.9 % 100 mL chemo infusion, 120 mg, Intravenous, Once, Sindy Guadeloupe, MD   heparin lock flush 100 unit/mL, 500 Units, Intracatheter, Once PRN, Sindy Guadeloupe, MD   PACLitaxel (TAXOL) 84 mg in sodium chloride 0.9 % 250 mL chemo infusion (</= 2m/m2), 45 mg/m2 (Treatment Plan Recorded), Intravenous, Once, RSindy Guadeloupe MD, Last Rate: 264 mL/hr  at 08/04/22 1140, 84 mg at 08/04/22 1140   sodium chloride flush (NS) 0.9 % injection 10 mL, 10 mL, Intracatheter, PRN, RSindy Guadeloupe MD  Physical exam:  Vitals:   08/04/22 0941 08/04/22 0945  BP:  (!) 156/94  Pulse:  73  Resp:  18  Temp:  (!) 97 F (36.1 C)  TempSrc:  Tympanic  SpO2:  100%  Weight: 158 lb (71.7 kg)   Height: _0  (1.753 m)    Physical Exam Cardiovascular:     Rate and Rhythm: Normal rate and regular rhythm.     Heart sounds: Normal heart sounds.  Pulmonary:     Effort: Pulmonary effort is normal.     Breath sounds: Normal breath sounds.  Abdominal:     General: Bowel sounds are normal.     Palpations: Abdomen is soft.  Skin:    General: Skin is warm and dry.  Neurological:     Mental Status: He is alert and oriented to person, place, and time.         Latest Ref Rng & Units 08/04/2022    9:04 AM  CMP  Glucose 70 - 99 mg/dL 118   BUN 8 - 23 mg/dL 25   Creatinine 0.61 - 1.24 mg/dL 0.94   Sodium 135 - 145 mmol/L 140   Potassium 3.5 - 5.1 mmol/L 4.0   Chloride 98 - 111 mmol/L 108   CO2 22 - 32 mmol/L 24   Calcium 8.9 - 10.3 mg/dL 8.1   Total Protein 6.5 - 8.1 g/dL 5.8   Total Bilirubin 0.3 - 1.2 mg/dL 0.6   Alkaline Phos 38 - 126 U/L 63  AST 15 - 41 U/L 20   ALT 0 - 44 U/L 17       Latest Ref Rng & Units 08/04/2022    9:04 AM  CBC  WBC 4.0 - 10.5 K/uL 5.0   Hemoglobin 13.0 - 17.0 g/dL 10.1   Hematocrit 39.0 - 52.0 % 30.0   Platelets 150 - 400 K/uL 153     Assessment and plan- Patient is a 87 y.o. 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   He is here for on treatment assessment prior to cycle 6 of weekly CarboTaxol chemotherapy  Counts okay to proceed with cycle 6 of weekly CarboTaxol chemotherapy today.  He will directly proceed for cycle 7 of treatment next week which would be his last cycle.  I will plan to repeat CT chest and abdomen pelvis with contrast about 2 weeks from now and see him  thereafter.  If scans show stable disease/partial response I will discuss maintenance durvalumab with him at that time  Patient will not require any Neupogen with this cycle   Visit Diagnosis 1. Primary malignant neoplasm of left upper lobe of lung ([place])   2. Encounter for antineoplastic chemotherapy      Dr. Randa Evens, MD, MPH [facility] [phone] 08/04/2022 12:31 PM

## 2022-08-05 ENCOUNTER — Ambulatory Visit
Admission: RE | Admit: 2022-08-05 | Discharge: 2022-08-05 | Disposition: A | Discharge status: Home / Self Care | Payer: Medicare HMO | Source: Ambulatory Visit | Attending: Radiation Oncology | Admitting: Radiation Oncology

## 2022-08-05 ENCOUNTER — Other Ambulatory Visit: Payer: Self-pay

## 2022-08-05 DIAGNOSIS — D6959 Other secondary thrombocytopenia: Secondary | ICD-10-CM | POA: Diagnosis not present

## 2022-08-05 DIAGNOSIS — N400 Enlarged prostate without lower urinary tract symptoms: Secondary | ICD-10-CM | POA: Diagnosis not present

## 2022-08-05 DIAGNOSIS — Z452 Encounter for adjustment and management of vascular access device: Secondary | ICD-10-CM | POA: Diagnosis not present

## 2022-08-05 DIAGNOSIS — N281 Cyst of kidney, acquired: Secondary | ICD-10-CM | POA: Diagnosis not present

## 2022-08-05 DIAGNOSIS — C3412 Malignant neoplasm of upper lobe, left bronchus or lung: Secondary | ICD-10-CM | POA: Diagnosis not present

## 2022-08-05 DIAGNOSIS — C771 Secondary and unspecified malignant neoplasm of intrathoracic lymph nodes: Secondary | ICD-10-CM | POA: Diagnosis not present

## 2022-08-05 DIAGNOSIS — Z87891 Personal history of nicotine dependence: Secondary | ICD-10-CM | POA: Diagnosis not present

## 2022-08-05 DIAGNOSIS — Z510 Encounter for antineoplastic radiation therapy: Secondary | ICD-10-CM | POA: Diagnosis not present

## 2022-08-05 DIAGNOSIS — D701 Agranulocytosis secondary to cancer chemotherapy: Secondary | ICD-10-CM | POA: Diagnosis not present

## 2022-08-05 LAB — RAD ONC ARIA SESSION SUMMARY
Course Elapsed Days: 47
Plan Fractions Treated to Date: 27
Plan Prescribed Dose Per Fraction: 2 Gy
Plan Total Fractions Prescribed: 35
Plan Total Prescribed Dose: 70 Gy
Reference Point Dosage Given to Date: 54 Gy
Reference Point Session Dosage Given: 2 Gy
Session Number: 27

## 2022-08-06 ENCOUNTER — Other Ambulatory Visit: Payer: Self-pay

## 2022-08-06 ENCOUNTER — Ambulatory Visit
Admission: RE | Admit: 2022-08-06 | Discharge: 2022-08-06 | Disposition: A | Discharge status: Home / Self Care | Payer: Medicare HMO | Source: Ambulatory Visit | Attending: Radiation Oncology | Admitting: Radiation Oncology

## 2022-08-06 DIAGNOSIS — Z87891 Personal history of nicotine dependence: Secondary | ICD-10-CM | POA: Diagnosis not present

## 2022-08-06 DIAGNOSIS — C3412 Malignant neoplasm of upper lobe, left bronchus or lung: Secondary | ICD-10-CM | POA: Diagnosis not present

## 2022-08-06 DIAGNOSIS — Z510 Encounter for antineoplastic radiation therapy: Secondary | ICD-10-CM | POA: Diagnosis not present

## 2022-08-06 DIAGNOSIS — N400 Enlarged prostate without lower urinary tract symptoms: Secondary | ICD-10-CM | POA: Diagnosis not present

## 2022-08-06 DIAGNOSIS — D701 Agranulocytosis secondary to cancer chemotherapy: Secondary | ICD-10-CM | POA: Diagnosis not present

## 2022-08-06 DIAGNOSIS — D6959 Other secondary thrombocytopenia: Secondary | ICD-10-CM | POA: Diagnosis not present

## 2022-08-06 DIAGNOSIS — C771 Secondary and unspecified malignant neoplasm of intrathoracic lymph nodes: Secondary | ICD-10-CM | POA: Diagnosis not present

## 2022-08-06 DIAGNOSIS — N281 Cyst of kidney, acquired: Secondary | ICD-10-CM | POA: Diagnosis not present

## 2022-08-06 DIAGNOSIS — Z452 Encounter for adjustment and management of vascular access device: Secondary | ICD-10-CM | POA: Diagnosis not present

## 2022-08-06 LAB — RAD ONC ARIA SESSION SUMMARY
Course Elapsed Days: 48
Plan Fractions Treated to Date: 28
Plan Prescribed Dose Per Fraction: 2 Gy
Plan Total Fractions Prescribed: 35
Plan Total Prescribed Dose: 70 Gy
Reference Point Dosage Given to Date: 56 Gy
Reference Point Session Dosage Given: 2 Gy
Session Number: 28

## 2022-08-07 ENCOUNTER — Ambulatory Visit
Admission: RE | Admit: 2022-08-07 | Discharge: 2022-08-07 | Disposition: A | Discharge status: Home / Self Care | Payer: Medicare HMO | Source: Ambulatory Visit | Attending: Radiation Oncology | Admitting: Radiation Oncology

## 2022-08-07 ENCOUNTER — Other Ambulatory Visit: Payer: Self-pay

## 2022-08-07 DIAGNOSIS — N281 Cyst of kidney, acquired: Secondary | ICD-10-CM | POA: Diagnosis not present

## 2022-08-07 DIAGNOSIS — Z87891 Personal history of nicotine dependence: Secondary | ICD-10-CM | POA: Diagnosis not present

## 2022-08-07 DIAGNOSIS — C771 Secondary and unspecified malignant neoplasm of intrathoracic lymph nodes: Secondary | ICD-10-CM | POA: Diagnosis not present

## 2022-08-07 DIAGNOSIS — C3412 Malignant neoplasm of upper lobe, left bronchus or lung: Secondary | ICD-10-CM | POA: Diagnosis not present

## 2022-08-07 DIAGNOSIS — Z510 Encounter for antineoplastic radiation therapy: Secondary | ICD-10-CM | POA: Diagnosis not present

## 2022-08-07 DIAGNOSIS — D6959 Other secondary thrombocytopenia: Secondary | ICD-10-CM | POA: Diagnosis not present

## 2022-08-07 DIAGNOSIS — D701 Agranulocytosis secondary to cancer chemotherapy: Secondary | ICD-10-CM | POA: Diagnosis not present

## 2022-08-07 DIAGNOSIS — Z452 Encounter for adjustment and management of vascular access device: Secondary | ICD-10-CM | POA: Diagnosis not present

## 2022-08-07 DIAGNOSIS — N400 Enlarged prostate without lower urinary tract symptoms: Secondary | ICD-10-CM | POA: Diagnosis not present

## 2022-08-07 LAB — RAD ONC ARIA SESSION SUMMARY
Course Elapsed Days: 49
Plan Fractions Treated to Date: 29
Plan Prescribed Dose Per Fraction: 2 Gy
Plan Total Fractions Prescribed: 35
Plan Total Prescribed Dose: 70 Gy
Reference Point Dosage Given to Date: 58 Gy
Reference Point Session Dosage Given: 2 Gy
Session Number: 29

## 2022-08-08 ENCOUNTER — Other Ambulatory Visit: Payer: Self-pay

## 2022-08-08 ENCOUNTER — Ambulatory Visit
Admission: RE | Admit: 2022-08-08 | Discharge: 2022-08-08 | Disposition: A | Discharge status: Home / Self Care | Payer: Medicare HMO | Source: Ambulatory Visit | Attending: Radiation Oncology | Admitting: Radiation Oncology

## 2022-08-08 ENCOUNTER — Ambulatory Visit: Payer: Medicare HMO

## 2022-08-08 DIAGNOSIS — N400 Enlarged prostate without lower urinary tract symptoms: Secondary | ICD-10-CM | POA: Diagnosis not present

## 2022-08-08 DIAGNOSIS — C3412 Malignant neoplasm of upper lobe, left bronchus or lung: Secondary | ICD-10-CM | POA: Diagnosis not present

## 2022-08-08 DIAGNOSIS — N281 Cyst of kidney, acquired: Secondary | ICD-10-CM | POA: Diagnosis not present

## 2022-08-08 DIAGNOSIS — Z452 Encounter for adjustment and management of vascular access device: Secondary | ICD-10-CM | POA: Diagnosis not present

## 2022-08-08 DIAGNOSIS — D6959 Other secondary thrombocytopenia: Secondary | ICD-10-CM | POA: Diagnosis not present

## 2022-08-08 DIAGNOSIS — Z510 Encounter for antineoplastic radiation therapy: Secondary | ICD-10-CM | POA: Diagnosis not present

## 2022-08-08 DIAGNOSIS — Z87891 Personal history of nicotine dependence: Secondary | ICD-10-CM | POA: Diagnosis not present

## 2022-08-08 DIAGNOSIS — D701 Agranulocytosis secondary to cancer chemotherapy: Secondary | ICD-10-CM | POA: Diagnosis not present

## 2022-08-08 DIAGNOSIS — C771 Secondary and unspecified malignant neoplasm of intrathoracic lymph nodes: Secondary | ICD-10-CM | POA: Diagnosis not present

## 2022-08-08 LAB — RAD ONC ARIA SESSION SUMMARY
Course Elapsed Days: 50
Plan Fractions Treated to Date: 30
Plan Prescribed Dose Per Fraction: 2 Gy
Plan Total Fractions Prescribed: 35
Plan Total Prescribed Dose: 70 Gy
Reference Point Dosage Given to Date: 60 Gy
Reference Point Session Dosage Given: 2 Gy
Session Number: 30

## 2022-08-08 MED FILL — Dexamethasone Sodium Phosphate Inj 100 MG/10ML: INTRAMUSCULAR | Qty: 1 | Status: AC

## 2022-08-11 ENCOUNTER — Other Ambulatory Visit: Payer: Self-pay

## 2022-08-11 ENCOUNTER — Inpatient Hospital Stay: Payer: Medicare HMO

## 2022-08-11 ENCOUNTER — Ambulatory Visit
Admission: RE | Admit: 2022-08-11 | Discharge: 2022-08-11 | Disposition: A | Discharge status: Home / Self Care | Payer: Medicare HMO | Source: Ambulatory Visit | Attending: Radiation Oncology | Admitting: Radiation Oncology

## 2022-08-11 VITALS — BP 151/69 | HR 75 | Temp 97.8°F | Resp 16 | Wt 160.7 lb

## 2022-08-11 DIAGNOSIS — C771 Secondary and unspecified malignant neoplasm of intrathoracic lymph nodes: Secondary | ICD-10-CM | POA: Diagnosis not present

## 2022-08-11 DIAGNOSIS — Z510 Encounter for antineoplastic radiation therapy: Secondary | ICD-10-CM | POA: Diagnosis not present

## 2022-08-11 DIAGNOSIS — C3412 Malignant neoplasm of upper lobe, left bronchus or lung: Secondary | ICD-10-CM | POA: Diagnosis not present

## 2022-08-11 DIAGNOSIS — D6959 Other secondary thrombocytopenia: Secondary | ICD-10-CM | POA: Diagnosis not present

## 2022-08-11 DIAGNOSIS — Z87891 Personal history of nicotine dependence: Secondary | ICD-10-CM | POA: Diagnosis not present

## 2022-08-11 DIAGNOSIS — N281 Cyst of kidney, acquired: Secondary | ICD-10-CM | POA: Diagnosis not present

## 2022-08-11 DIAGNOSIS — N400 Enlarged prostate without lower urinary tract symptoms: Secondary | ICD-10-CM | POA: Diagnosis not present

## 2022-08-11 DIAGNOSIS — Z452 Encounter for adjustment and management of vascular access device: Secondary | ICD-10-CM | POA: Diagnosis not present

## 2022-08-11 DIAGNOSIS — D701 Agranulocytosis secondary to cancer chemotherapy: Secondary | ICD-10-CM | POA: Diagnosis not present

## 2022-08-11 LAB — RAD ONC ARIA SESSION SUMMARY
Course Elapsed Days: 53
Plan Fractions Treated to Date: 31
Plan Prescribed Dose Per Fraction: 2 Gy
Plan Total Fractions Prescribed: 35
Plan Total Prescribed Dose: 70 Gy
Reference Point Dosage Given to Date: 62 Gy
Reference Point Session Dosage Given: 2 Gy
Session Number: 31

## 2022-08-11 LAB — CBC WITH DIFFERENTIAL/PLATELET
Abs Immature Granulocytes: 0.01 10*3/uL (ref 0.00–0.07)
Basophils Absolute: 0 10*3/uL (ref 0.0–0.1)
Basophils Relative: 1 %
Eosinophils Absolute: 0 10*3/uL (ref 0.0–0.5)
Eosinophils Relative: 1 %
HCT: 26.9 % — ABNORMAL LOW (ref 39.0–52.0)
Hemoglobin: 9.1 g/dL — ABNORMAL LOW (ref 13.0–17.0)
Immature Granulocytes: 0 %
Lymphocytes Relative: 15 %
Lymphs Abs: 0.4 10*3/uL — ABNORMAL LOW (ref 0.7–4.0)
MCH: 33.7 pg (ref 26.0–34.0)
MCHC: 33.8 g/dL (ref 30.0–36.0)
MCV: 99.6 fL (ref 80.0–100.0)
Monocytes Absolute: 0.3 10*3/uL (ref 0.1–1.0)
Monocytes Relative: 10 %
Neutro Abs: 2 10*3/uL (ref 1.7–7.7)
Neutrophils Relative %: 73 %
Platelets: 116 10*3/uL — ABNORMAL LOW (ref 150–400)
RBC: 2.7 MIL/uL — ABNORMAL LOW (ref 4.22–5.81)
RDW: 18 % — ABNORMAL HIGH (ref 11.5–15.5)
WBC: 2.7 10*3/uL — ABNORMAL LOW (ref 4.0–10.5)
nRBC: 0 % (ref 0.0–0.2)

## 2022-08-11 LAB — COMPREHENSIVE METABOLIC PANEL
ALT: 16 U/L (ref 0–44)
AST: 17 U/L (ref 15–41)
Albumin: 3.1 g/dL — ABNORMAL LOW (ref 3.5–5.0)
Alkaline Phosphatase: 51 U/L (ref 38–126)
Anion gap: 6 (ref 5–15)
BUN: 26 mg/dL — ABNORMAL HIGH (ref 8–23)
CO2: 24 mmol/L (ref 22–32)
Calcium: 8.1 mg/dL — ABNORMAL LOW (ref 8.9–10.3)
Chloride: 111 mmol/L (ref 98–111)
Creatinine, Ser: 0.87 mg/dL (ref 0.61–1.24)
GFR, Estimated: >60 mL/min (ref >60)
Glucose, Bld: 103 mg/dL — ABNORMAL HIGH (ref 70–99)
Potassium: 4.2 mmol/L (ref 3.5–5.1)
Sodium: 141 mmol/L (ref 135–145)
Total Bilirubin: 0.9 mg/dL (ref 0.3–1.2)
Total Protein: 5.6 g/dL — ABNORMAL LOW (ref 6.5–8.1)

## 2022-08-11 MED ORDER — SODIUM CHLORIDE 0.9 % IV SOLN
119.4000 mg | Freq: Once | INTRAVENOUS | Status: AC
  Administered 2022-08-11: 120 mg via INTRAVENOUS
  Filled 2022-08-11: qty 12

## 2022-08-11 MED ORDER — DIPHENHYDRAMINE HCL 50 MG/ML IJ SOLN
50.0000 mg | Freq: Once | INTRAMUSCULAR | Status: AC
  Administered 2022-08-11: 50 mg via INTRAVENOUS
  Filled 2022-08-11: qty 1

## 2022-08-11 MED ORDER — SODIUM CHLORIDE 0.9 % IV SOLN
10.0000 mg | Freq: Once | INTRAVENOUS | Status: AC
  Administered 2022-08-11: 10 mg via INTRAVENOUS
  Filled 2022-08-11: qty 10

## 2022-08-11 MED ORDER — FAMOTIDINE IN NACL 20-0.9 MG/50ML-% IV SOLN
20.0000 mg | Freq: Once | INTRAVENOUS | Status: AC
  Administered 2022-08-11: 20 mg via INTRAVENOUS
  Filled 2022-08-11: qty 50

## 2022-08-11 MED ORDER — SODIUM CHLORIDE 0.9 % IV SOLN
45.0000 mg/m2 | Freq: Once | INTRAVENOUS | Status: AC
  Administered 2022-08-11: 84 mg via INTRAVENOUS
  Filled 2022-08-11: qty 14

## 2022-08-11 MED ORDER — SODIUM CHLORIDE 0.9 % IV SOLN
Freq: Once | INTRAVENOUS | Status: AC
  Filled 2022-08-11: qty 250

## 2022-08-11 MED ORDER — PALONOSETRON HCL INJECTION 0.25 MG/5ML
0.2500 mg | Freq: Once | INTRAVENOUS | Status: AC
  Administered 2022-08-11: 0.25 mg via INTRAVENOUS
  Filled 2022-08-11: qty 5

## 2022-08-11 NOTE — Patient Instructions (Signed)
[facility] CANCER CTR AT [place]  Discharge Instructions: Thank you for choosing [facility] to provide your oncology and hematology care.  If you have a lab appointment with the [facility], please go directly to the [facility] and check in at the registration area.  Wear comfortable clothing and clothing appropriate for easy access to any Portacath or PICC line.   We strive to give you quality time with your provider. You may need to reschedule your appointment if you arrive late (15 or more minutes).  Arriving late affects you and other patients whose appointments are after yours.  Also, if you miss three or more appointments without notifying the office, you may be dismissed from the clinic at the provider's discretion.      For prescription refill requests, have your pharmacy contact our office and allow 72 hours for refills to be completed.    Today you received the following chemotherapy and/or immunotherapy agents Taxol, Paraplatin      To help prevent nausea and vomiting after your treatment, we encourage you to take your nausea medication as directed.  BELOW ARE SYMPTOMS THAT SHOULD BE REPORTED IMMEDIATELY: *FEVER GREATER THAN 100.4 F (38 C) OR HIGHER *CHILLS OR SWEATING *NAUSEA AND VOMITING THAT IS NOT CONTROLLED WITH YOUR NAUSEA MEDICATION *UNUSUAL SHORTNESS OF BREATH *UNUSUAL BRUISING OR BLEEDING *URINARY PROBLEMS (pain or burning when urinating, or frequent urination) *BOWEL PROBLEMS (unusual diarrhea, constipation, pain near the anus) TENDERNESS IN MOUTH AND THROAT WITH OR WITHOUT PRESENCE OF ULCERS (sore throat, sores in mouth, or a toothache) UNUSUAL RASH, SWELLING OR PAIN  UNUSUAL VAGINAL DISCHARGE OR ITCHING   Items with * indicate a potential emergency and should be followed up as soon as possible or go to the Emergency Department if any problems should occur.  Please show the CHEMOTHERAPY ALERT CARD or IMMUNOTHERAPY ALERT CARD at  check-in to the Emergency Department and triage nurse.  Should you have questions after your visit or need to cancel or reschedule your appointment, please contact [facility] CANCER [place] AT [place]  [phone] and follow the prompts.  Office hours are 8:00 a.m. to 4:30 p.m. Monday - Friday. Please note that voicemails left after 4:00 p.m. may not be returned until the following business day.  We are closed weekends and major holidays. You have access to a nurse at all times for urgent questions. Please call the main number to the clinic [phone] and follow the prompts.  For any non-urgent questions, you may also contact your provider using MyChart. We now offer e-Visits for anyone 20 and older to request care online for non-urgent symptoms. For details visit [email or web address].   Also download the MyChart app! Go to the app store, search "MyChart", open the app, select [facility], and log in with your MyChart username and password.  Masks are optional in the cancer centers. If you would like for your care team to wear a mask while they are taking care of you, please let them know. For doctor visits, patients may have with them one support person who is at least 85 years old. At this time, visitors are not allowed in the infusion area.

## 2022-08-12 ENCOUNTER — Other Ambulatory Visit: Payer: Self-pay

## 2022-08-12 ENCOUNTER — Ambulatory Visit
Admission: RE | Admit: 2022-08-12 | Discharge: 2022-08-12 | Disposition: A | Discharge status: Home / Self Care | Payer: Medicare HMO | Source: Ambulatory Visit | Attending: Radiation Oncology | Admitting: Radiation Oncology

## 2022-08-12 DIAGNOSIS — Z87891 Personal history of nicotine dependence: Secondary | ICD-10-CM | POA: Diagnosis not present

## 2022-08-12 DIAGNOSIS — Z510 Encounter for antineoplastic radiation therapy: Secondary | ICD-10-CM | POA: Diagnosis not present

## 2022-08-12 DIAGNOSIS — C771 Secondary and unspecified malignant neoplasm of intrathoracic lymph nodes: Secondary | ICD-10-CM | POA: Diagnosis not present

## 2022-08-12 DIAGNOSIS — C3412 Malignant neoplasm of upper lobe, left bronchus or lung: Secondary | ICD-10-CM | POA: Diagnosis not present

## 2022-08-12 DIAGNOSIS — D701 Agranulocytosis secondary to cancer chemotherapy: Secondary | ICD-10-CM | POA: Diagnosis not present

## 2022-08-12 DIAGNOSIS — N400 Enlarged prostate without lower urinary tract symptoms: Secondary | ICD-10-CM | POA: Diagnosis not present

## 2022-08-12 DIAGNOSIS — D6959 Other secondary thrombocytopenia: Secondary | ICD-10-CM | POA: Diagnosis not present

## 2022-08-12 DIAGNOSIS — Z452 Encounter for adjustment and management of vascular access device: Secondary | ICD-10-CM | POA: Diagnosis not present

## 2022-08-12 DIAGNOSIS — N281 Cyst of kidney, acquired: Secondary | ICD-10-CM | POA: Diagnosis not present

## 2022-08-12 LAB — RAD ONC ARIA SESSION SUMMARY
Course Elapsed Days: 54
Plan Fractions Treated to Date: 32
Plan Prescribed Dose Per Fraction: 2 Gy
Plan Total Fractions Prescribed: 35
Plan Total Prescribed Dose: 70 Gy
Reference Point Dosage Given to Date: 64 Gy
Reference Point Session Dosage Given: 2 Gy
Session Number: 32

## 2022-08-12 NOTE — Progress Notes (Signed)
I,[name],acting as a scribe for [employer], MD.,have documented all relevant documentation on the behalf of Casey Foster, MD,as directed by  Casey Foster, MD while in the presence of Casey Foster, MD.   Established patient visit   [name]   DOB: Sep 12, 1934   87 y.o. Male  MRN: [id number] Visit Date: 08/14/2022  Today's healthcare provider: [name], MD   Chief Complaint  Patient presents with   INR check   Subjective    HPI    Patient here for INR check. Currently on Coumadin 4 mg daily.  Lab Results  Component Value Date   INR 2.4 08/14/2022   INR 5.0 (A) 07/30/2022   INR 5.0 07/30/2022    Need refill on Gabapentin. Patient reports that he has been taking this regularly for LE neuropathy. He had recent creatinine level of 0 .87 08/11/22 . He reports still works out at [employer] everyday for 30-33min.   Medications: Outpatient Medications Prior to Visit  Medication Sig   amLODipine (NORVASC) 5 MG tablet Take 1 tablet (5 mg total) by mouth daily.   benazepril (LOTENSIN) 40 MG tablet TAKE 1 TABLET EVERY DAY   carvedilol (COREG) 6.25 MG tablet Take 1 tablet (6.25 mg total) by mouth 2 (two) times daily.   Cyanocobalamin (VITAMIN B 12 PO) Take 1 capsule by mouth 2 (two) times daily. 1000 mcg each capsule   dexamethasone (DECADRON) 4 MG tablet Take 2 tablets daily for 2 days, start the day after chemotherapy. Take with food.   doxazosin (CARDURA) 8 MG tablet Take 1 tablet (8 mg total) by mouth daily.   finasteride (PROSCAR) 5 MG tablet Take 1 tablet (5 mg total) by mouth daily.   lidocaine-prilocaine (EMLA) cream Apply to affected area once   Magnesium 500 MG TABS Take 500 mg by mouth daily.   Multiple Vitamin (MULTI-VITAMIN) tablet Take 1 tablet by mouth daily.    omeprazole (PRILOSEC) 20 MG capsule Take 20 mg by mouth daily.   ondansetron (ZOFRAN) 8 MG tablet Take 1 tablet (8 mg total)  by mouth every 8 (eight) hours as needed for nausea or vomiting. Start on the third day after chemotherapy.   oxyCODONE (OXY IR/ROXICODONE) 5 MG immediate release tablet Take by mouth.   prochlorperazine (COMPAZINE) 10 MG tablet Take 1 tablet (10 mg total) by mouth every 6 (six) hours as needed for nausea or vomiting.   triamterene-hydrochlorothiazide (MAXZIDE-25) 37.5-25 MG tablet TAKE 1/2 TABLET EVERY DAY   [DISCONTINUED] gabapentin (NEURONTIN) 300 MG capsule TAKE 1 CAPSULE EVERY MORNING, 1 CAPSULE IN THE AFTERNOON AND 2 CAPSULES AT BEDTIME   [DISCONTINUED] warfarin (COUMADIN) 4 MG tablet TAKE 1 TABLET EVERY DAY IN THE AFTERNOON (Patient taking differently: 5 mg,  Mon, Weds, Fri, 4 mg the rest of the week)   Facility-Administered Medications Prior to Visit  Medication Dose Route Frequency Provider   heparin lock flush 100 UNIT/ML injection         Review of Systems     Objective    BP (!) 155/78 (BP Location: Left Arm, Patient Position: Sitting, Cuff Size: Normal)   Pulse 86   Temp 97.7 F (36.5 C) (Oral)   Resp 16   Wt 161 lb 3.2 oz (73.1 kg)   BMI 23.81 kg/m    Physical Exam Vitals reviewed.  Constitutional:      General: He is not in acute distress.    Appearance: Normal appearance. He is not ill-appearing, toxic-appearing  or diaphoretic.  Eyes:     Conjunctiva/sclera: Conjunctivae normal.  Cardiovascular:     Rate and Rhythm: Normal rate. Rhythm irregular.     Pulses: Normal pulses.     Heart sounds:     No friction rub. No gallop.  Pulmonary:     Effort: Pulmonary effort is normal. No respiratory distress.     Breath sounds: Normal breath sounds. No stridor. No wheezing, rhonchi or rales.  Abdominal:     General: Bowel sounds are normal. There is no distension.     Palpations: Abdomen is soft.     Tenderness: There is no abdominal tenderness.  Musculoskeletal:     Right lower leg: No edema.     Left lower leg: No edema.  Skin:    Findings: No erythema or rash.   Neurological:     Mental Status: He is alert and oriented to person, place, and time.      Results for orders placed or performed in visit on 08/14/22  POCT INR  Result Value Ref Range   POC INR 2.4    PT 29.0   Results for orders placed or performed in visit on 08/14/22  Rad Onc [facility] Session Summary  Result Value Ref Range   Course ID C1_Chest    Course Intent Unknown    Course Start Date 06/09/2022  1:14 PM    Session Number 62    Course First Treatment Date 06/19/2022  9:53 AM    Course Last Treatment Date 08/14/2022 10:56 AM    Course Elapsed Days 56    Reference Point ID Lt Lung DP    Reference Point Dosage Given to Date 47 Gy   Reference Point Session Dosage Given 2 Gy   Plan ID Lung_L    Plan Fractions Treated to Date 34    Plan Total Fractions Prescribed 35    Plan Prescribed Dose Per Fraction 2 Gy   Plan Total Prescribed Dose 70.000000 Gy   Plan Primary Reference Point Lt Lung DP     Assessment & Plan     Problem List Items Addressed This Visit       Cardiovascular and Mediastinum   AF (paroxysmal atrial fibrillation) (HCC)    Stable  Followed by Dr. Nehemiah Massed for cardiology care  Continues on coumadin for anticoagulation  INR within goal range of 2-3  Follow up for INR check in 3 weeks  Will continue 4mg  warfarin dose       Relevant Medications   warfarin (COUMADIN) 4 MG tablet   Essential (primary) hypertension - Primary    BP elevated in office  Reports hx of White coat HTN, normal home BP within goal range  Will follow up at next Office Visit        Relevant Medications   warfarin (COUMADIN) 4 MG tablet     Nervous and Auditory   Neuropathy    Stable on gabapentin, refills provided       Relevant Medications   gabapentin (NEURONTIN) 300 MG capsule     Return in about 6 months (around 02/13/2023) for AWV  .      I, Casey Foster, MD, have reviewed all documentation for this visit.  Portions of this information were  initially documented by the CMA and reviewed by me for thoroughness and accuracy.      Casey Foster, MD  [facility] [phone] (phone) [phone] (fax)  [place]

## 2022-08-13 ENCOUNTER — Ambulatory Visit
Admission: RE | Admit: 2022-08-13 | Discharge: 2022-08-13 | Disposition: A | Discharge status: Home / Self Care | Payer: Medicare HMO | Source: Ambulatory Visit | Attending: Radiation Oncology | Admitting: Radiation Oncology

## 2022-08-13 ENCOUNTER — Other Ambulatory Visit: Payer: Self-pay

## 2022-08-13 DIAGNOSIS — D701 Agranulocytosis secondary to cancer chemotherapy: Secondary | ICD-10-CM | POA: Diagnosis not present

## 2022-08-13 DIAGNOSIS — N400 Enlarged prostate without lower urinary tract symptoms: Secondary | ICD-10-CM | POA: Diagnosis not present

## 2022-08-13 DIAGNOSIS — Z452 Encounter for adjustment and management of vascular access device: Secondary | ICD-10-CM | POA: Diagnosis not present

## 2022-08-13 DIAGNOSIS — Z87891 Personal history of nicotine dependence: Secondary | ICD-10-CM | POA: Diagnosis not present

## 2022-08-13 DIAGNOSIS — D6959 Other secondary thrombocytopenia: Secondary | ICD-10-CM | POA: Diagnosis not present

## 2022-08-13 DIAGNOSIS — C3412 Malignant neoplasm of upper lobe, left bronchus or lung: Secondary | ICD-10-CM | POA: Diagnosis not present

## 2022-08-13 DIAGNOSIS — N281 Cyst of kidney, acquired: Secondary | ICD-10-CM | POA: Diagnosis not present

## 2022-08-13 DIAGNOSIS — Z510 Encounter for antineoplastic radiation therapy: Secondary | ICD-10-CM | POA: Diagnosis not present

## 2022-08-13 DIAGNOSIS — C771 Secondary and unspecified malignant neoplasm of intrathoracic lymph nodes: Secondary | ICD-10-CM | POA: Diagnosis not present

## 2022-08-13 LAB — RAD ONC ARIA SESSION SUMMARY
Course Elapsed Days: 55
Plan Fractions Treated to Date: 33
Plan Prescribed Dose Per Fraction: 2 Gy
Plan Total Fractions Prescribed: 35
Plan Total Prescribed Dose: 70 Gy
Reference Point Dosage Given to Date: 66 Gy
Reference Point Session Dosage Given: 2 Gy
Session Number: 33

## 2022-08-14 ENCOUNTER — Ambulatory Visit
Admission: RE | Admit: 2022-08-14 | Discharge: 2022-08-14 | Disposition: A | Discharge status: Home / Self Care | Payer: Medicare HMO | Source: Ambulatory Visit | Attending: Radiation Oncology | Admitting: Radiation Oncology

## 2022-08-14 ENCOUNTER — Encounter: Payer: Self-pay | Admitting: Family Medicine

## 2022-08-14 ENCOUNTER — Other Ambulatory Visit: Payer: Self-pay

## 2022-08-14 ENCOUNTER — Ambulatory Visit: Payer: Medicare HMO

## 2022-08-14 ENCOUNTER — Ambulatory Visit (INDEPENDENT_AMBULATORY_CARE_PROVIDER_SITE_OTHER): Payer: Medicare HMO | Admitting: Family Medicine

## 2022-08-14 VITALS — BP 155/78 | HR 86 | Temp 97.7°F | Resp 16 | Wt 161.2 lb

## 2022-08-14 DIAGNOSIS — D701 Agranulocytosis secondary to cancer chemotherapy: Secondary | ICD-10-CM | POA: Diagnosis not present

## 2022-08-14 DIAGNOSIS — I10 Essential (primary) hypertension: Secondary | ICD-10-CM

## 2022-08-14 DIAGNOSIS — Z87891 Personal history of nicotine dependence: Secondary | ICD-10-CM | POA: Diagnosis not present

## 2022-08-14 DIAGNOSIS — I480 Paroxysmal atrial fibrillation: Secondary | ICD-10-CM

## 2022-08-14 DIAGNOSIS — C771 Secondary and unspecified malignant neoplasm of intrathoracic lymph nodes: Secondary | ICD-10-CM | POA: Diagnosis not present

## 2022-08-14 DIAGNOSIS — N281 Cyst of kidney, acquired: Secondary | ICD-10-CM | POA: Diagnosis not present

## 2022-08-14 DIAGNOSIS — C3412 Malignant neoplasm of upper lobe, left bronchus or lung: Secondary | ICD-10-CM | POA: Diagnosis not present

## 2022-08-14 DIAGNOSIS — Z452 Encounter for adjustment and management of vascular access device: Secondary | ICD-10-CM | POA: Diagnosis not present

## 2022-08-14 DIAGNOSIS — G629 Polyneuropathy, unspecified: Secondary | ICD-10-CM | POA: Insufficient documentation

## 2022-08-14 DIAGNOSIS — D6959 Other secondary thrombocytopenia: Secondary | ICD-10-CM | POA: Diagnosis not present

## 2022-08-14 DIAGNOSIS — Z510 Encounter for antineoplastic radiation therapy: Secondary | ICD-10-CM | POA: Diagnosis not present

## 2022-08-14 DIAGNOSIS — N400 Enlarged prostate without lower urinary tract symptoms: Secondary | ICD-10-CM | POA: Diagnosis not present

## 2022-08-14 LAB — POCT INR
POC INR: 2.4
PT: 29

## 2022-08-14 LAB — RAD ONC ARIA SESSION SUMMARY
Course Elapsed Days: 56
Plan Fractions Treated to Date: 34
Plan Prescribed Dose Per Fraction: 2 Gy
Plan Total Fractions Prescribed: 35
Plan Total Prescribed Dose: 70 Gy
Reference Point Dosage Given to Date: 68 Gy
Reference Point Session Dosage Given: 2 Gy
Session Number: 34

## 2022-08-14 MED ORDER — GABAPENTIN 300 MG PO CAPS: ORAL_CAPSULE | ORAL | 2 refills | Status: DC

## 2022-08-15 ENCOUNTER — Other Ambulatory Visit: Payer: Self-pay

## 2022-08-15 ENCOUNTER — Ambulatory Visit (HOSPITAL_COMMUNITY): Payer: Medicare HMO

## 2022-08-15 ENCOUNTER — Ambulatory Visit
Admission: RE | Admit: 2022-08-15 | Discharge: 2022-08-15 | Disposition: A | Discharge status: Home / Self Care | Payer: Medicare HMO | Source: Ambulatory Visit | Attending: Radiation Oncology | Admitting: Radiation Oncology

## 2022-08-15 ENCOUNTER — Ambulatory Visit
Admission: RE | Admit: 2022-08-15 | Discharge: 2022-08-15 | Disposition: A | Discharge status: Home / Self Care | Payer: Medicare HMO | Source: Ambulatory Visit | Attending: Oncology | Admitting: Oncology

## 2022-08-15 DIAGNOSIS — D6959 Other secondary thrombocytopenia: Secondary | ICD-10-CM | POA: Diagnosis not present

## 2022-08-15 DIAGNOSIS — I700 Atherosclerosis of aorta: Secondary | ICD-10-CM | POA: Diagnosis not present

## 2022-08-15 DIAGNOSIS — N281 Cyst of kidney, acquired: Secondary | ICD-10-CM | POA: Diagnosis not present

## 2022-08-15 DIAGNOSIS — J432 Centrilobular emphysema: Secondary | ICD-10-CM | POA: Diagnosis not present

## 2022-08-15 DIAGNOSIS — N400 Enlarged prostate without lower urinary tract symptoms: Secondary | ICD-10-CM | POA: Diagnosis not present

## 2022-08-15 DIAGNOSIS — C771 Secondary and unspecified malignant neoplasm of intrathoracic lymph nodes: Secondary | ICD-10-CM | POA: Diagnosis not present

## 2022-08-15 DIAGNOSIS — N320 Bladder-neck obstruction: Secondary | ICD-10-CM | POA: Diagnosis not present

## 2022-08-15 DIAGNOSIS — Z87891 Personal history of nicotine dependence: Secondary | ICD-10-CM | POA: Diagnosis not present

## 2022-08-15 DIAGNOSIS — C3412 Malignant neoplasm of upper lobe, left bronchus or lung: Secondary | ICD-10-CM | POA: Insufficient documentation

## 2022-08-15 DIAGNOSIS — R590 Localized enlarged lymph nodes: Secondary | ICD-10-CM | POA: Diagnosis not present

## 2022-08-15 DIAGNOSIS — C3490 Malignant neoplasm of unspecified part of unspecified bronchus or lung: Secondary | ICD-10-CM | POA: Diagnosis not present

## 2022-08-15 DIAGNOSIS — Z510 Encounter for antineoplastic radiation therapy: Secondary | ICD-10-CM | POA: Diagnosis not present

## 2022-08-15 DIAGNOSIS — D701 Agranulocytosis secondary to cancer chemotherapy: Secondary | ICD-10-CM | POA: Diagnosis not present

## 2022-08-15 DIAGNOSIS — Z452 Encounter for adjustment and management of vascular access device: Secondary | ICD-10-CM | POA: Diagnosis not present

## 2022-08-15 LAB — RAD ONC ARIA SESSION SUMMARY
Course Elapsed Days: 57
Plan Fractions Treated to Date: 35
Plan Prescribed Dose Per Fraction: 2 Gy
Plan Total Fractions Prescribed: 35
Plan Total Prescribed Dose: 70 Gy
Reference Point Dosage Given to Date: 70 Gy
Reference Point Session Dosage Given: 2 Gy
Session Number: 35

## 2022-08-15 MED ORDER — HEPARIN SOD (PORK) LOCK FLUSH 100 UNIT/ML IV SOLN
INTRAVENOUS | Status: AC
  Filled 2022-08-15: qty 5

## 2022-08-15 MED ORDER — IOHEXOL 300 MG/ML  SOLN
100.0000 mL | Freq: Once | INTRAMUSCULAR | Status: AC | PRN
  Administered 2022-08-15: 100 mL via INTRAVENOUS

## 2022-08-17 MED ORDER — WARFARIN SODIUM 4 MG PO TABS: 4.0000 mg | ORAL_TABLET | Freq: Every day | ORAL | 0 refills | Status: DC

## 2022-08-17 NOTE — Assessment & Plan Note (Signed)
BP elevated in office  Reports hx of White coat HTN, normal home BP within goal range  Will follow up at next Office Visit

## 2022-08-17 NOTE — Assessment & Plan Note (Signed)
Stable on gabapentin, refills provided

## 2022-08-17 NOTE — Assessment & Plan Note (Signed)
Stable  Followed by Dr. Nehemiah Massed for cardiology care  Continues on coumadin for anticoagulation  INR within goal range of 2-3  Follow up for INR check in 3 weeks  Will continue 4mg  warfarin dose

## 2022-08-20 ENCOUNTER — Inpatient Hospital Stay (HOSPITAL_BASED_OUTPATIENT_CLINIC_OR_DEPARTMENT_OTHER): Payer: Medicare HMO | Admitting: Oncology

## 2022-08-20 ENCOUNTER — Ambulatory Visit: Payer: Medicare HMO | Admitting: Pulmonary Disease

## 2022-08-20 ENCOUNTER — Encounter: Payer: Self-pay | Admitting: Oncology

## 2022-08-20 ENCOUNTER — Telehealth: Payer: Self-pay | Admitting: *Deleted

## 2022-08-20 VITALS — BP 132/83 | HR 82 | Temp 97.3°F | Resp 16 | Wt 164.0 lb

## 2022-08-20 DIAGNOSIS — D6959 Other secondary thrombocytopenia: Secondary | ICD-10-CM | POA: Diagnosis not present

## 2022-08-20 DIAGNOSIS — N281 Cyst of kidney, acquired: Secondary | ICD-10-CM | POA: Diagnosis not present

## 2022-08-20 DIAGNOSIS — C3412 Malignant neoplasm of upper lobe, left bronchus or lung: Secondary | ICD-10-CM

## 2022-08-20 DIAGNOSIS — D701 Agranulocytosis secondary to cancer chemotherapy: Secondary | ICD-10-CM | POA: Diagnosis not present

## 2022-08-20 DIAGNOSIS — Z7189 Other specified counseling: Secondary | ICD-10-CM

## 2022-08-20 DIAGNOSIS — N400 Enlarged prostate without lower urinary tract symptoms: Secondary | ICD-10-CM | POA: Diagnosis not present

## 2022-08-20 DIAGNOSIS — C771 Secondary and unspecified malignant neoplasm of intrathoracic lymph nodes: Secondary | ICD-10-CM | POA: Diagnosis not present

## 2022-08-20 DIAGNOSIS — Z452 Encounter for adjustment and management of vascular access device: Secondary | ICD-10-CM | POA: Diagnosis not present

## 2022-08-20 DIAGNOSIS — Z510 Encounter for antineoplastic radiation therapy: Secondary | ICD-10-CM | POA: Diagnosis not present

## 2022-08-20 DIAGNOSIS — Z87891 Personal history of nicotine dependence: Secondary | ICD-10-CM | POA: Diagnosis not present

## 2022-08-20 NOTE — Progress Notes (Unsigned)
Pt in for follow up and CT results.

## 2022-08-20 NOTE — Progress Notes (Signed)
DISCONTINUE ON PATHWAY REGIMEN - Non-Small Cell Lung     A cycle is every 7 days, concurrent with RT:     Paclitaxel      Carboplatin   **Always confirm dose/schedule in your pharmacy ordering system**  REASON: Continuation Of Treatment PRIOR TREATMENT: [id number]: Carboplatin AUC=2 + Paclitaxel 45 mg/m2 Weekly During Radiation TREATMENT RESPONSE: Partial Response (PR)  START ON PATHWAY REGIMEN - Non-Small Cell Lung     A cycle is every 28 days:     Durvalumab   **Always confirm dose/schedule in your pharmacy ordering system**  Patient Characteristics: Preoperative or Nonsurgical Candidate (Clinical Staging), Stage III - Nonsurgical Candidate (Nonsquamous and Squamous), PS = 0, 1 Therapeutic Status: Preoperative or Nonsurgical Candidate (Clinical Staging) AJCC T Category: cT3 AJCC N Category: cN2 AJCC M Category: cM0 AJCC 8 Stage Grouping: IIIB ECOG Performance Status: 1 Intent of Therapy: Curative Intent, Discussed with Patient

## 2022-08-20 NOTE — Progress Notes (Unsigned)
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  He was found to have a left upper lobe lung mass measuring 5.5 x 3.7 x 2.8 cm.  It was confluent with the left hilar lymph node.  Enlarged AP window lymph node measuring 16 mm.  Enlarged left paratracheal lymph node.  Low-attenuation 7 mm lesion in the liver.  7 mm cyst in the uncinate process of the pancreas for which follow-up was not recommended.  Bilateral kidney cysts.  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  Prostatic enlargement.   PET CT is can showed hypermetabolic left upper lobe lung mass measuring 5.4 x 3.9 cm with an SUV of 15.1.  Ipsilateral mediastinal lymph node metastases with index lymph node measuring 1.7 with an SUV of 9.8.  No evidence of distant metastatic disease.  Biopsy of the left upper lobe as well as station 4R lymph node was consistent with squamous cell carcinoma   NGS testing did not show any actionable mutations.  PD-L1 35%.  MSI stable.  MRI brain was negative for metastatic disease  Patient completed concurrent  chemoradiation with weekly CarboTaxol chemotherapy in December 2023.  Scans following that showed partial response to treatment.  Interval history-he has tolerated treatments well so far.  He does report occasional pain on the right side of his chest when he swallows.  ECOG PS- 1 Pain scale- 2   Review of systems- Review of Systems  Gastrointestinal:        Dysphagia      Allergies  Allergen Reactions   Cefdinir Nausea Only    hypersensitivity to smell   Doxycycline     Sun sensitivity   Prednisone     Hallucinations    Sertraline Other (See Comments)    Hallucinations      Past Medical History:  Diagnosis Date   A-fib ([place])    a.) CHA2DS2-VASc = 6 (age x2, HTN, DVT x2, vascular disease history). b.) rate/rhythm maintained on oral carvedilol; chronically anticoagulated using daily warfarin.   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   Bladder calculi    BPH (benign prostatic hyperplasia)    DDD (degenerative disc disease), lumbar    a.) s/p LEFT laminectomy L5   Diverticulosis    DOE (dyspnea on exertion)    DVT (deep venous thrombosis) (HCC)    Elevated PSA    GERD (gastroesophageal reflux disease)    H/O degenerative disc disease    HLD (  hyperlipidemia)    Hypertension    Lipoma of arm    Lipomatosis    Long term current use of anticoagulant    a.) warfarin   Lung cancer ([place])    Mass of upper lobe of left lung 04/24/2022   a.)  CT chest 04/24/2022: Multilobulated perihilar mass measuring 5.3 x 3.7 x 2.8 cm with associated LEFT hilar and mediastinal LAD; b.)  PET CT 26/71/2458: Hypermetabolic LEFT upper lobe lung mass (max SUV 15.1); ipsilateral nodal metastasis --> presuming a NSC histology, consistent with stage IIIb pulmonary neoplasm (T3 N2 M0) -->  tissue Bx pending.   NICM (nonischemic cardiomyopathy) ([place]) 11/22/2015   a.) TTE 11/22/2015: EF 40%. b.) TTE 12/27/2018: EF 45%. c.) TTE 08/30/2020: EF 45-50%.   Night sweats    Obesity    Osteoarthritis    RBBB (right bundle branch block)    Right inguinal hernia    VHD (valvular heart disease) 11/22/2015   a.) TTE 11/22/2015: EF 40%; mod MR, mod-sev TR, triv PR; mod BAE, mild RV enlargement. b.) TTE 12/27/2018: EF 45%; mod MR, mod-sev TR, triv PR; mod BAE, mild RV enlargement. c.) TTE 08/30/2020: EF 45-50%; mild MR/TR.     Past Surgical History:  Procedure Laterality Date   Bilateral Radical Keratotomy Bilateral 1997   COLONOSCOPY WITH PROPOFOL N/A 01/11/2016   Procedure: COLONOSCOPY WITH PROPOFOL;  Surgeon: Manya Silvas, MD;  Location: [facility] ENDOSCOPY;  Service: Endoscopy;  Laterality: N/A; repeat 3 years, tubular adenoma   DENTAL SURGERY     Patient had implants   FLEXIBLE BRONCHOSCOPY Left 05/19/2022   Procedure: FLEXIBLE BRONCHOSCOPY;  Surgeon: Tyler Pita, MD;  Location: [facility] ORS;  Service: Cardiopulmonary;  Laterality: Left;   GUM SURGERY     IR IMAGING GUIDED PORT INSERTION  06/13/2022   L4 - S1 decompression Left    PROSTATE BIOPSY     TONSILLECTOMY AND ADENOIDECTOMY  age 35   VIDEO BRONCHOSCOPY WITH ENDOBRONCHIAL ULTRASOUND Left 05/19/2022   Procedure: VIDEO BRONCHOSCOPY WITH ENDOBRONCHIAL ULTRASOUND;  Surgeon: Tyler Pita, MD;  Location: [facility] ORS;  Service: Cardiopulmonary;  Laterality: Left;   XI ROBOTIC ASSISTED INGUINAL HERNIA REPAIR WITH MESH Right 10/21/2021   Procedure: XI ROBOTIC ASSISTED INGUINAL HERNIA REPAIR WITH MESH;  Surgeon: Ronny Bacon, MD;  Location: [facility] ORS;  Service: General;  Laterality: Right;    Social History   Socioeconomic History   Marital status: Married    Spouse name: Doris   Number of children: 3   Years of education: Not on file   Highest education level: Associate degree: occupational, Hotel manager, or vocational  program  Occupational History   Occupation: retired  Tobacco Use   Smoking status: Former    Types: Cigarettes    Quit date: 08/26/1971    Years since quitting: 51.0    Passive exposure: Never   Smokeless tobacco: Never   Tobacco comments:    Smoked for about 20 years.  Vaping Use   Vaping Use: Never used  Substance and Sexual Activity   Alcohol use: Not Currently   Drug use: No   Sexual activity: Not Currently  Other Topics Concern   Not on file  Social History Narrative   Not on file   Social Determinants of Health   Financial Resource Strain: Low Risk  (12/18/2021)   Overall Financial Resource Strain (CARDIA)    Difficulty of Paying Living Expenses: Not hard at all  Food Insecurity: No Food Insecurity (12/18/2021)   Hunger Vital Sign  Worried About Charity fundraiser in the Last Year: Never true    [place] in the Last Year: Never true  Transportation Needs: No Transportation Needs (12/18/2021)   PRAPARE - Hydrologist (Medical): No    Lack of Transportation (Non-Medical): No  Physical Activity: Sufficiently Active (12/18/2021)   Exercise Vital Sign    Days of Exercise per Week: 7 days    Minutes of Exercise per Session: 50 min  Stress: No Stress Concern Present (12/18/2021)   [place]    Feeling of Stress : Only a little  Social Connections: Moderately Integrated (12/18/2021)   Social Connection and Isolation Panel [NHANES]    Frequency of Communication with Friends and Family: Twice a week    Frequency of Social Gatherings with Friends and Family: Once a week    Attends Religious Services: More than 4 times per year    Active Member of [employer] or Organizations: No    Attends Archivist Meetings: Never    Marital Status: Married  Human resources officer Violence: Not At Risk (12/18/2021)   Humiliation, Afraid, Rape, and Kick questionnaire    Fear of Current or  Ex-Partner: No    Emotionally Abused: No    Physically Abused: No    Sexually Abused: No    Family History  Problem Relation Age of Onset   Cancer Mother        secondary to cancinoma of the jaw from her dipping snuff.   Heart attack Father    CAD Father    Hypertension Sister    Diabetes Son        Type I diabetes   Prostate cancer Neg Hx    Kidney cancer Neg Hx      Current Outpatient Medications:    amLODipine (NORVASC) 5 MG tablet, Take 1 tablet (5 mg total) by mouth daily., Disp: 90 tablet, Rfl: 0   benazepril (LOTENSIN) 40 MG tablet, TAKE 1 TABLET EVERY DAY, Disp: 90 tablet, Rfl: 3   carvedilol (COREG) 6.25 MG tablet, Take 1 tablet (6.25 mg total) by mouth 2 (two) times daily., Disp: 180 tablet, Rfl: 0   Cyanocobalamin (VITAMIN B 12 PO), Take 1 capsule by mouth 2 (two) times daily. 1000 mcg each capsule, Disp: , Rfl:    doxazosin (CARDURA) 8 MG tablet, Take 1 tablet (8 mg total) by mouth daily., Disp: 90 tablet, Rfl: 0   finasteride (PROSCAR) 5 MG tablet, Take 1 tablet (5 mg total) by mouth daily., Disp: 90 tablet, Rfl: 3   gabapentin (NEURONTIN) 300 MG capsule, TAKE 1 CAPSULE EVERY MORNING, 1 CAPSULE IN THE AFTERNOON AND 2 CAPSULES AT BEDTIME, Disp: 360 capsule, Rfl: 2   Magnesium 500 MG TABS, Take 500 mg by mouth daily., Disp: , Rfl:    Multiple Vitamin (MULTI-VITAMIN) tablet, Take 1 tablet by mouth daily. , Disp: , Rfl:    omeprazole (PRILOSEC) 20 MG capsule, Take 20 mg by mouth daily., Disp: , Rfl:    triamterene-hydrochlorothiazide (MAXZIDE-25) 37.5-25 MG tablet, TAKE 1/2 TABLET EVERY DAY, Disp: 45 tablet, Rfl: 0   warfarin (COUMADIN) 4 MG tablet, Take 1 tablet (4 mg total) by mouth daily., Disp: 90 tablet, Rfl: 0   oxyCODONE (OXY IR/ROXICODONE) 5 MG immediate release tablet, Take by mouth. (Patient not taking: Reported on 08/20/2022), Disp: , Rfl:  No current facility-administered medications for this visit.  Facility-Administered Medications Ordered in Other Visits:  heparin lock flush 100 UNIT/ML injection, , , ,   Physical exam:  Vitals:   08/20/22 1102  BP: 132/83  Pulse: 82  Resp: 16  Temp: (!) 97.3 F (36.3 C)  TempSrc: Tympanic  SpO2: 100%  Weight: 164 lb (74.4 kg)   Physical Exam Cardiovascular:     Rate and Rhythm: Normal rate and regular rhythm.     Heart sounds: Normal heart sounds.  Pulmonary:     Effort: Pulmonary effort is normal.     Breath sounds: Normal breath sounds.  Skin:    General: Skin is warm and dry.  Neurological:     Mental Status: He is alert and oriented to person, place, and time.         Latest Ref Rng & Units 08/11/2022   10:37 AM  CMP  Glucose 70 - 99 mg/dL 103   BUN 8 - 23 mg/dL 26   Creatinine 0.61 - 1.24 mg/dL 0.87   Sodium 135 - 145 mmol/L 141   Potassium 3.5 - 5.1 mmol/L 4.2   Chloride 98 - 111 mmol/L 111   CO2 22 - 32 mmol/L 24   Calcium 8.9 - 10.3 mg/dL 8.1   Total Protein 6.5 - 8.1 g/dL 5.6   Total Bilirubin 0.3 - 1.2 mg/dL 0.9   Alkaline Phos 38 - 126 U/L 51   AST 15 - 41 U/L 17   ALT 0 - 44 U/L 16       Latest Ref Rng & Units 08/11/2022   10:37 AM  CBC  WBC 4.0 - 10.5 K/uL 2.7   Hemoglobin 13.0 - 17.0 g/dL 9.1   Hematocrit 39.0 - 52.0 % 26.9   Platelets 150 - 400 K/uL 116       CT CHEST ABDOMEN PELVIS W CONTRAST  Result Date: 08/17/2022 CLINICAL DATA:  Non-small cell lung cancer. EXAM: CT CHEST, ABDOMEN, AND PELVIS WITH CONTRAST TECHNIQUE: Multidetector CT imaging of the chest, abdomen and pelvis was performed following the standard protocol during bolus administration of intravenous contrast. RADIATION DOSE REDUCTION: This exam was performed according to the departmental dose-optimization program which includes automated exposure control, adjustment of the mA and/or kV according to patient size and/or use of iterative reconstruction technique. CONTRAST:  185m OMNIPAQUE IOHEXOL 300 MG/ML  SOLN COMPARISON:  CT of the chest dated 05/15/2022. FINDINGS: CT CHEST FINDINGS  Cardiovascular: Mild cardiomegaly. No pericardial effusion. There is 3 vessel coronary vascular calcification. Mild atherosclerotic calcification of the thoracic aorta. No aneurysmal dilatation or dissection. The origins of the great vessels of the aortic arch appear patent. The central pulmonary arteries are grossly unremarkable. Right-sided Port-A-Cath with tip in the region of the right atrium close to the cavoatrial junction. Mediastinum/Nodes: Left suprahilar/mediastinal adenopathy in the AP window measures 13 mm in short axis (previously 18 mm). No other adenopathy. The esophagus is grossly unremarkable. No mediastinal fluid collection. Lungs/Pleura: Left upper lobe mass measures approximately 1.6 x 5.0 cm (previously 3.5 x 5.7 cm by my measurements). There is background of mild centrilobular emphysema. There is no pleural effusion or pneumothorax. The central airways are patent. Musculoskeletal: There is osteopenia with degenerative changes of the spine. No acute osseous pathology. CT ABDOMEN PELVIS FINDINGS No intra-abdominal free air. Trace perihepatic and pelvic free fluid. Hepatobiliary: Subcentimeter left hepatic hypodense focus is too small to characterize. No biliary dilatation. The gallbladder is unremarkable. Pancreas: Unremarkable. No pancreatic ductal dilatation or surrounding inflammatory changes. Spleen: Normal in size without focal abnormality. Adrenals/Urinary Tract: The adrenal  glands unremarkable. 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 Multiple right renal inferior pole cysts and several additional bilateral subcentimeter hypodense lesions which are too small to characterize. There is no hydronephrosis on either side. There is symmetric enhancement of the renal parenchyma bilaterally. The visualized ureters appear unremarkable. There is trabeculated  appearance of the bladder wall consistent with chronic bladder outlet obstruction. A 4 mm focus of calcification along the right posterior bladder wall may represent a recently passed stone versus a bladder wall calcification. Stomach/Bowel: There is large amount of stool throughout the colon. There is no bowel obstruction or active inflammation. No evidence of acute appendicitis. Vascular/Lymphatic: Advanced aortoiliac atherosclerotic disease. The IVC is unremarkable. No portal venous gas. There is no adenopathy. Reproductive: Enlarged prostate gland measuring 6 cm in transverse axial diameter. Other: Mild diffuse subcutaneous edema. Musculoskeletal: Osteopenia with degenerative changes of the spine. No acute osseous pathology. IMPRESSION: 1. Interval decrease in the size of the left upper lobe mass and left suprahilar/mediastinal adenopathy. 2. No evidence of metastatic disease in the abdomen or pelvis. 3. Large amount of stool throughout the colon. No bowel obstruction. 4. Enlarged prostate gland with findings of chronic bladder outlet obstruction. 5. A 4 mm focus of calcification along the right posterior bladder wall may represent a recently passed stone versus a bladder wall calcification. 6. 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 Further evaluation with MRI without and with contrast recommended. 7. Aortic Atherosclerosis (ICD10-I70.0) and Emphysema (ICD10-J43.9). Electronically Signed   By: Anner Crete M.D.   On: 08/17/2022 23:01     Assessment and plan- Patient is a 87 y.o. 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  He is s/p weekly CarboTaxol chemotherapy with concurrent radiation therapy and here to discuss CT scan results and further management  I have reviewed CT chest abdomen pelvis images independently and discussed findings  with the patient which overall shows response to treatment and decrease in the size of the left upper lobe lung mass as well as mediastinal adenopathy.  No evidence of progressive or distant disease.  I therefore recommend proceeding with maintenance durvalumab at this time.  In a randomized phase III trial, over 700 patients with unresectable, stage III NSCLC without progression after at least two cycles of platinum-based chemoradiotherapy were randomly assigned to up to 12 months of the PD-L1 antibody durvalumab every two weeks or placebo in a 2:1 ratio. At a median follow-up of 25 months, durvalumab increased the median patient free survival (17.2 versus 5.6 months; HR for disease progression or death 0.51, 95% CI 0.41-0.63) and overall survival (HR for death 0.68, 95% CI 0.54-0.86) . Subsequent reporting revealed five-year survival rates of 43 versus 33 percent, and median OS of 48 versus 29 months, with and without durvalumab, respectively.   He will directly proceed for cycle 1 of durvalumab treatment next week and I will see him back 5 weeks from today for cycle 2.  Treatment will be given with a curative intent.  Patient and his wife comprehend my plan well.  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  I will increase Prilosec to 20 mg twice daily.   Visit Diagnosis 1. Primary malignant  neoplasm of left upper lobe of lung ([place])   2. Goals of care, counseling/discussion      Dr. Randa Evens, MD, MPH [facility] at [facility] [phone] 08/20/2022 7:31 PM

## 2022-08-20 NOTE — Telephone Encounter (Signed)
Called to let the pt/wife to know that dr Janese Banks said to take 40 mg omeprazole every morning. So he can take 2 every day in am. Left the message on the voicemail. Let my direct number to call back

## 2022-08-21 ENCOUNTER — Ambulatory Visit (INDEPENDENT_AMBULATORY_CARE_PROVIDER_SITE_OTHER): Payer: Medicare HMO | Admitting: Pulmonary Disease

## 2022-08-21 ENCOUNTER — Encounter: Payer: Self-pay | Admitting: Oncology

## 2022-08-21 ENCOUNTER — Encounter: Payer: Self-pay | Admitting: Pulmonary Disease

## 2022-08-21 ENCOUNTER — Other Ambulatory Visit: Payer: Self-pay | Admitting: Family Medicine

## 2022-08-21 VITALS — BP 130/78 | HR 60 | Temp 97.6°F | Ht 69.0 in | Wt 164.6 lb

## 2022-08-21 DIAGNOSIS — C3412 Malignant neoplasm of upper lobe, left bronchus or lung: Secondary | ICD-10-CM | POA: Diagnosis not present

## 2022-08-21 DIAGNOSIS — I4819 Other persistent atrial fibrillation: Secondary | ICD-10-CM

## 2022-08-21 MED ORDER — WARFARIN SODIUM 4 MG PO TABS: 4.0000 mg | ORAL_TABLET | Freq: Every day | ORAL | 0 refills | Status: DC

## 2022-08-21 NOTE — Patient Instructions (Signed)
I was glad to see that you are doing so well.  Continue follow-up with Dr. Janese Banks and Dr. Baruch Gouty.  We will see you here on an as-needed basis.

## 2022-08-21 NOTE — Addendum Note (Signed)
Addended by: Sindy Guadeloupe on: 08/21/2022 07:23 AM   Modules accepted: Orders

## 2022-08-21 NOTE — Telephone Encounter (Signed)
Requested medication (s) are due for refill today: routing for review  Requested medication (s) are on the active medication list: yes  Last refill:  08/13/22  Future visit scheduled: yes  Notes to clinic:  Unable to refill per protocol, [facility] did not receive medication, routing for review.      Requested Prescriptions  Pending Prescriptions Disp Refills   warfarin (COUMADIN) 4 MG tablet 90 tablet 0    Sig: Take 1 tablet (4 mg total) by mouth daily.     There is no refill protocol information for this order

## 2022-08-21 NOTE — Progress Notes (Signed)
Subjective:    Patient ID: Casey Gallegos, male    DOB: 07/08/1935, 87 y.o.   MRN: [id number] Patient Care Team: Ronnald Ramp, MD as PCP - General (Family Medicine) Vanna Scotland, MD as Consulting Physician (Urology) Lamar Blinks, MD as Consulting Physician (Cardiology) Lockie Mola, MD as Referring Physician (Ophthalmology) Deirdre Evener, MD (Dermatology) Glory Buff, RN as Oncology Nurse Navigator Creig Hines, MD as Consulting Physician (Oncology) Salena Saner, MD as Consulting Physician (Pulmonary Disease)  Chief Complaint  Patient presents with   Follow-up    Bronch on 05/19/2022. Cough with sputum occasionally. No SOB or wheezing.    HPI Patient is an 87 year old remote former smoker who presents for follow-up after bronchoscopy for left lung mass.  He underwent bronchoscopy on 19 May 2022.  On bronchoscopy was noted to have a left upper lobe mass consistent with squamous cell carcinoma stage IIIB.  Patient also had mediastinal adenopathy station 4L lymph node was positive for metastatic disease.  Tumor was PD-L1 positive.  Patient has undergone chemoradiation.  He has tolerated this well.  His recent chest CT was performed on 15 August 2022 showing good response from chemoradiation and no evidence of metastatic disease.  He is to start maintenance immunotherapy with Durvalumab every 28 days.  Patient presents today without any complaints.  He tolerated bronchoscopy well without any difficulties.  He does not endorse any chest pain, shortness of breath, cough, sputum production, no hemoptysis.  Overall despite his cancer diagnosis he feels well.   Review of Systems A 10 point review of systems was performed and it is as noted above otherwise negative.  Patient Active Problem List   Diagnosis Date Noted   Neuropathy 08/14/2022   Squamous cell carcinoma of left lung (HCC) 06/05/2022   Primary malignant neoplasm of left upper lobe of  lung (HCC) 05/26/2022   Renal cyst 05/26/2022   Goals of care, counseling/discussion 05/26/2022   Mass of left lung    Mediastinal adenopathy    Right inguinal hernia 10/16/2021   Inguinal bulge 10/07/2021   Muscle strain 09/10/2021   Cardiomyopathy (HCC) 12/30/2018   Chronic venous insufficiency 11/11/2017   Lymphedema 11/11/2017   Pain in limb 10/27/2017   Cardiomyopathy, dilated, nonischemic (HCC) 11/07/2015   A-fib (HCC) 03/02/2015   Deep vein thrombosis (HCC) 03/02/2015   Essential (primary) hypertension 03/02/2015   Combined fat and carbohydrate induced hyperlipemia 03/02/2015   Heart valve disease 03/02/2015   Allergic rhinitis 02/28/2015   Absolute anemia 02/28/2015   Benign fibroma of prostate 02/28/2015   Back pain, chronic 02/28/2015   Narrowing of intervertebral disc space 02/28/2015   Acute thromboembolism of deep veins of lower extremity (HCC) 02/28/2015   ED (erectile dysfunction) of organic origin 02/28/2015   Abnormal prostate specific antigen 02/28/2015   BP (high blood pressure) 02/28/2015   Lipomatosis 02/28/2015   Malaise and fatigue 02/28/2015   Generalized hyperhidrosis 02/28/2015   Arthritis, degenerative 02/28/2015   AF (paroxysmal atrial fibrillation) (HCC) 02/28/2015   Chronic prostatitis 02/28/2015   Spinal stenosis 02/28/2015   Atypical pneumonia 02/28/2015   Acute embolism and thrombosis of deep vein of lower extremity (HCC) 02/28/2015   Social History   Tobacco Use   Smoking status: Former    Types: Cigarettes    Quit date: 08/26/1971    Years since quitting: 51.0    Passive exposure: Never   Smokeless tobacco: Never   Tobacco comments:    Smoked for about 20 years.  Substance Use Topics   Alcohol use: Not Currently   Allergies  Allergen Reactions   Cefdinir Nausea Only    hypersensitivity to smell   Doxycycline     Sun sensitivity   Prednisone     Hallucinations    Sertraline Other (See Comments)    Hallucinations    Current  Meds  Medication Sig   amLODipine (NORVASC) 5 MG tablet Take 1 tablet (5 mg total) by mouth daily.   benazepril (LOTENSIN) 40 MG tablet TAKE 1 TABLET EVERY DAY   carvedilol (COREG) 6.25 MG tablet Take 1 tablet (6.25 mg total) by mouth 2 (two) times daily.   Cyanocobalamin (VITAMIN B 12 PO) Take 1 capsule by mouth 2 (two) times daily. 1000 mcg each capsule   doxazosin (CARDURA) 8 MG tablet Take 1 tablet (8 mg total) by mouth daily.   finasteride (PROSCAR) 5 MG tablet Take 1 tablet (5 mg total) by mouth daily.   gabapentin (NEURONTIN) 300 MG capsule TAKE 1 CAPSULE EVERY MORNING, 1 CAPSULE IN THE AFTERNOON AND 2 CAPSULES AT BEDTIME   Magnesium 500 MG TABS Take 500 mg by mouth daily.   Multiple Vitamin (MULTI-VITAMIN) tablet Take 1 tablet by mouth daily.    omeprazole (PRILOSEC) 20 MG capsule Take 20 mg by mouth daily.   oxyCODONE (OXY IR/ROXICODONE) 5 MG immediate release tablet Take by mouth.   triamterene-hydrochlorothiazide (MAXZIDE-25) 37.5-25 MG tablet TAKE 1/2 TABLET EVERY DAY   warfarin (COUMADIN) 4 MG tablet Take 1 tablet (4 mg total) by mouth daily.   Immunization History  Administered Date(s) Administered   Fluad Quad(high Dose 65+) 05/10/2019, 07/03/2021, 05/30/2022   Influenza, High Dose Seasonal PF 05/11/2015, 05/21/2016, 05/27/2017, 05/26/2018, 05/01/2020   PFIZER(Purple Top)SARS-COV-2 Vaccination 09/09/2019, 09/30/2019, 05/25/2020   Pneumococcal Conjugate-13 01/09/2014   Pneumococcal Polysaccharide-23 08/08/2004   Td 10/25/2008       Objective:   Physical Exam BP 130/78 (BP Location: Left Arm, Cuff Size: Normal)   Pulse 60   Temp 97.6 F (36.4 C)   Ht 5\' 9"  (1.753 m)   Wt 164 lb 9.6 oz (74.7 kg)   SpO2 98%   BMI 24.31 kg/m   SpO2: 98 % O2 Device: None (Room air)  GENERAL: Well-developed, well-nourished gentleman, no acute distress.  Fully oriented.  No conversational dyspnea. Fully ambulatory. HEAD: Normocephalic, atraumatic.  EYES: Pupils equal, round,  reactive to light.  No scleral icterus.  MOUTH: Dental implants.  Mallampati III airway, oral mucosa moist. NECK: Supple. No thyromegaly. Trachea midline. No JVD.  No adenopathy. PULMONARY: Good air entry bilaterally.  No adventitious sounds. CARDIOVASCULAR: S1 and S2.  Irregular rate and rhythm, controlled VR.  No rubs murmurs gallops heard. ABDOMEN: Benign. MUSCULOSKELETAL: No joint deformity, no clubbing, there is bilateral lower extremity edema left greater than right, pitting 2+ left, 1+ right.  NEUROLOGIC: Somewhat forgetful.  No overt focal deficits. SKIN: Intact,warm,dry. PSYCH: Mood and behavior normal.     Assessment & Plan:     ICD-10-CM   1. Primary malignant neoplasm of left upper lobe of lung [facility])  C34.12    Patient is undergoing therapy through oncology No sequela after bronchoscopy    2. Persistent atrial fibrillation [facility])  I48.19    Patient currently on Coumadin Follows with cardiology     Patient is to continue follow-up with medical and radiation oncology.  Continue follow-up with primary care and cardiology as well.  We will see him here on an as-needed basis.  Gailen Shelter, MD Advanced  Bronchoscopy PCCM [facility]    *This note was dictated using voice recognition software/Dragon.  Despite best efforts to proofread, errors can occur which can change the meaning. Any transcriptional errors that result from this process are unintentional and may not be fully corrected at the time of dictation.

## 2022-08-21 NOTE — Telephone Encounter (Signed)
Medication Refill - Medication: warfarin (COUMADIN) 4 MG tablet   Has the patient contacted their pharmacy? Yes.  Pt stated [facility] reached out to him and advised him they have been sending requests for refills but have been unable to reach the office.  (Agent: If yes, when and what did the pharmacy advise?)  Preferred Pharmacy (with phone number or street name):  [facility], [place]  [place] [address]  Phone: [phone] Fax: [phone]  Hours: Not open 24 hours   Has the patient been seen for an appointment in the last year OR does the patient have an upcoming appointment? Yes.    Agent: Please be advised that RX refills may take up to 3 business days. We ask that you follow-up with your pharmacy.

## 2022-08-29 ENCOUNTER — Ambulatory Visit: Payer: Medicare HMO | Admitting: Oncology

## 2022-08-29 ENCOUNTER — Inpatient Hospital Stay: Payer: Medicare HMO | Attending: Oncology

## 2022-08-29 ENCOUNTER — Inpatient Hospital Stay: Payer: Medicare HMO

## 2022-08-29 VITALS — BP 145/61 | HR 62 | Temp 96.0°F | Resp 16 | Wt 163.5 lb

## 2022-08-29 DIAGNOSIS — Z79899 Other long term (current) drug therapy: Secondary | ICD-10-CM | POA: Insufficient documentation

## 2022-08-29 DIAGNOSIS — C3412 Malignant neoplasm of upper lobe, left bronchus or lung: Secondary | ICD-10-CM

## 2022-08-29 DIAGNOSIS — Z5112 Encounter for antineoplastic immunotherapy: Secondary | ICD-10-CM | POA: Insufficient documentation

## 2022-08-29 LAB — CBC WITH DIFFERENTIAL/PLATELET
Abs Immature Granulocytes: 0.02 10*3/uL (ref 0.00–0.07)
Basophils Absolute: 0 10*3/uL (ref 0.0–0.1)
Basophils Relative: 1 %
Eosinophils Absolute: 0.1 10*3/uL (ref 0.0–0.5)
Eosinophils Relative: 2 %
HCT: 27.8 % — ABNORMAL LOW (ref 39.0–52.0)
Hemoglobin: 9.2 g/dL — ABNORMAL LOW (ref 13.0–17.0)
Immature Granulocytes: 1 %
Lymphocytes Relative: 13 %
Lymphs Abs: 0.4 10*3/uL — ABNORMAL LOW (ref 0.7–4.0)
MCH: 33 pg (ref 26.0–34.0)
MCHC: 33.1 g/dL (ref 30.0–36.0)
MCV: 99.6 fL (ref 80.0–100.0)
Monocytes Absolute: 0.4 10*3/uL (ref 0.1–1.0)
Monocytes Relative: 14 %
Neutro Abs: 2.2 10*3/uL (ref 1.7–7.7)
Neutrophils Relative %: 69 %
Platelets: 97 10*3/uL — ABNORMAL LOW (ref 150–400)
RBC: 2.79 MIL/uL — ABNORMAL LOW (ref 4.22–5.81)
RDW: 17.8 % — ABNORMAL HIGH (ref 11.5–15.5)
WBC: 3.1 10*3/uL — ABNORMAL LOW (ref 4.0–10.5)
nRBC: 0 % (ref 0.0–0.2)

## 2022-08-29 LAB — COMPREHENSIVE METABOLIC PANEL
ALT: 13 U/L (ref 0–44)
AST: 21 U/L (ref 15–41)
Albumin: 3.2 g/dL — ABNORMAL LOW (ref 3.5–5.0)
Alkaline Phosphatase: 56 U/L (ref 38–126)
Anion gap: 8 (ref 5–15)
BUN: 22 mg/dL (ref 8–23)
CO2: 24 mmol/L (ref 22–32)
Calcium: 8 mg/dL — ABNORMAL LOW (ref 8.9–10.3)
Chloride: 107 mmol/L (ref 98–111)
Creatinine, Ser: 0.92 mg/dL (ref 0.61–1.24)
GFR, Estimated: >60 mL/min (ref >60)
Glucose, Bld: 121 mg/dL — ABNORMAL HIGH (ref 70–99)
Potassium: 3.7 mmol/L (ref 3.5–5.1)
Sodium: 139 mmol/L (ref 135–145)
Total Bilirubin: 0.8 mg/dL (ref 0.3–1.2)
Total Protein: 5.8 g/dL — ABNORMAL LOW (ref 6.5–8.1)

## 2022-08-29 LAB — TSH: TSH: 2.366 u[IU]/mL (ref 0.350–4.500)

## 2022-08-29 MED ORDER — HEPARIN SOD (PORK) LOCK FLUSH 100 UNIT/ML IV SOLN
500.0000 [IU] | Freq: Once | INTRAVENOUS | Status: AC | PRN
  Administered 2022-08-29: 500 [IU]
  Filled 2022-08-29: qty 5

## 2022-08-29 MED ORDER — SODIUM CHLORIDE 0.9 % IV SOLN
Freq: Once | INTRAVENOUS | Status: AC
  Filled 2022-08-29: qty 250

## 2022-08-29 MED ORDER — SODIUM CHLORIDE 0.9 % IV SOLN
1500.0000 mg | Freq: Once | INTRAVENOUS | Status: AC
  Administered 2022-08-29: 1500 mg via INTRAVENOUS
  Filled 2022-08-29: qty 30

## 2022-08-29 NOTE — Patient Instructions (Signed)
[facility] AT [place]  Discharge Instructions: Thank you for choosing [facility] to provide your oncology and hematology care.  If you have a lab appointment with the [facility], please go directly to the [facility] and check in at the registration area.  Wear comfortable clothing and clothing appropriate for easy access to any Portacath or PICC line.   We strive to give you quality time with your provider. You may need to reschedule your appointment if you arrive late (15 or more minutes).  Arriving late affects you and other patients whose appointments are after yours.  Also, if you miss three or more appointments without notifying the office, you may be dismissed from the clinic at the provider's discretion.      For prescription refill requests, have your pharmacy contact our office and allow 72 hours for refills to be completed.    Today you received the following chemotherapy and/or immunotherapy agents Durvalumab (Imfinzi)      To help prevent nausea and vomiting after your treatment, we encourage you to take your nausea medication as directed.  BELOW ARE SYMPTOMS THAT SHOULD BE REPORTED IMMEDIATELY: *FEVER GREATER THAN 100.4 F (38 C) OR HIGHER *CHILLS OR SWEATING *NAUSEA AND VOMITING THAT IS NOT CONTROLLED WITH YOUR NAUSEA MEDICATION *UNUSUAL SHORTNESS OF BREATH *UNUSUAL BRUISING OR BLEEDING *URINARY PROBLEMS (pain or burning when urinating, or frequent urination) *BOWEL PROBLEMS (unusual diarrhea, constipation, pain near the anus) TENDERNESS IN MOUTH AND THROAT WITH OR WITHOUT PRESENCE OF ULCERS (sore throat, sores in mouth, or a toothache) UNUSUAL RASH, SWELLING OR PAIN  UNUSUAL VAGINAL DISCHARGE OR ITCHING   Items with * indicate a potential emergency and should be followed up as soon as possible or go to the Emergency Department if any problems should occur.  Please show the CHEMOTHERAPY ALERT CARD or IMMUNOTHERAPY ALERT CARD at  check-in to the Emergency Department and triage nurse.  Should you have questions after your visit or need to cancel or reschedule your appointment, please contact [facility] [place] [facility] [place]  [phone] and follow the prompts.  Office hours are 8:00 a.m. to 4:30 p.m. Monday - Friday. Please note that voicemails left after 4:00 p.m. may not be returned until the following business day.  We are closed weekends and major holidays. You have access to a nurse at all times for urgent questions. Please call the main number to the clinic [phone] and follow the prompts.  For any non-urgent questions, you may also contact your provider using MyChart. We now offer e-Visits for anyone 29 and older to request care online for non-urgent symptoms. For details visit [email or web address].   Also download the MyChart app! Go to the app store, search "MyChart", open the app, select [facility], and log in with your MyChart username and password.

## 2022-08-30 LAB — T4: T4, Total: 7.7 ug/dL (ref 4.5–12.0)

## 2022-09-03 ENCOUNTER — Ambulatory Visit (INDEPENDENT_AMBULATORY_CARE_PROVIDER_SITE_OTHER): Payer: Medicare HMO

## 2022-09-03 DIAGNOSIS — I480 Paroxysmal atrial fibrillation: Secondary | ICD-10-CM | POA: Diagnosis not present

## 2022-09-03 LAB — POCT INR
INR: 1.8 — AB (ref 2.0–3.0)
POC INR: 1.8
PT: 21.4

## 2022-09-04 ENCOUNTER — Ambulatory Visit: Payer: Medicare HMO | Admitting: Gastroenterology

## 2022-09-08 DIAGNOSIS — Z961 Presence of intraocular lens: Secondary | ICD-10-CM | POA: Diagnosis not present

## 2022-09-08 DIAGNOSIS — H26491 Other secondary cataract, right eye: Secondary | ICD-10-CM | POA: Diagnosis not present

## 2022-09-09 ENCOUNTER — Ambulatory Visit: Payer: Medicare HMO | Admitting: Urology

## 2022-09-15 ENCOUNTER — Encounter: Payer: Self-pay | Admitting: Urology

## 2022-09-15 ENCOUNTER — Ambulatory Visit: Payer: Medicare HMO | Admitting: Urology

## 2022-09-15 VITALS — BP 115/69 | HR 87 | Ht 69.0 in | Wt 160.0 lb

## 2022-09-15 DIAGNOSIS — N281 Cyst of kidney, acquired: Secondary | ICD-10-CM

## 2022-09-15 DIAGNOSIS — N401 Enlarged prostate with lower urinary tract symptoms: Secondary | ICD-10-CM

## 2022-09-15 DIAGNOSIS — N138 Other obstructive and reflux uropathy: Secondary | ICD-10-CM

## 2022-09-15 NOTE — Progress Notes (Unsigned)
09/15/2022 1:00 PM   Casey Gallegos March 29, 1935 [id number]  Referring provider: Jerrol Banana., MD No address on file  Chief Complaint  Patient presents with   Benign Prostatic Hypertrophy    HPI: 88 y.o. male previously followed for BPH with history urinary retention and incomplete bladder emptying referred for evaluation of a complex right renal cyst  He saw Dr. Erlene Quan March 2023 for TRUS for volume and to discuss HoLEP.  His prostate volume was 89 cc.  His voiding symptoms had improved and his voiding pattern significantly improved and he decided against HoLEP Diagnosed with lung cancer 03/2022 and incidental finding on CT of the chest abdomen and pelvis was  a 2.7 x 2.8 cm complicated right renal cyst with enhancing septa States he continues to do well from a voiding standpoint.  IPSS 10/35 with QoL rated 1/6 Follow-up CT 08/15/2022 shows a stable complex cyst measuring 2.9 x 2.6 cm   PMH: Past Medical History:  Diagnosis Date   A-fib [facility]    a.) CHA2DS2-VASc = 6 (age x2, HTN, DVT x2, vascular disease history). b.) rate/rhythm maintained on oral carvedilol; chronically anticoagulated using daily warfarin.   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   Bladder calculi    BPH (benign prostatic hyperplasia)    DDD (degenerative disc disease), lumbar    a.) s/p LEFT laminectomy L5   Diverticulosis    DOE (dyspnea on exertion)    DVT (deep venous thrombosis) (HCC)    Elevated PSA    GERD (gastroesophageal reflux disease)    H/O degenerative disc disease    HLD (hyperlipidemia)    Hypertension    Lipoma of arm    Lipomatosis    Long term current use of anticoagulant    a.) warfarin   Lung cancer ([place])    Mass of upper lobe of left lung 04/24/2022   a.)  CT  chest 04/24/2022: Multilobulated perihilar mass measuring 5.3 x 3.7 x 2.8 cm with associated LEFT hilar and mediastinal LAD; b.)  PET CT 68/07/7516: Hypermetabolic LEFT upper lobe lung mass (max SUV 15.1); ipsilateral nodal metastasis --> presuming a NSC histology, 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   NICM (nonischemic cardiomyopathy) ([place]) 11/22/2015   a.) TTE 11/22/2015: EF 40%. b.) TTE 12/27/2018: EF 45%. c.) TTE 08/30/2020: EF 45-50%.   Night sweats    Obesity    Osteoarthritis    RBBB (right bundle branch block)    Right inguinal hernia    VHD (valvular heart disease) 11/22/2015   a.) TTE 11/22/2015: EF 40%; mod MR, mod-sev TR, triv PR; mod BAE, mild RV enlargement. b.) TTE 12/27/2018: EF 45%; mod MR, mod-sev TR, triv PR; mod BAE, mild RV enlargement. c.) TTE 08/30/2020: EF 45-50%; mild MR/TR.    Surgical History: Past Surgical History:  Procedure Laterality Date   Bilateral Radical Keratotomy Bilateral 1997   COLONOSCOPY WITH PROPOFOL N/A 01/11/2016   Procedure: COLONOSCOPY WITH PROPOFOL;  Surgeon: Manya Silvas, MD;  [facility] END[facility]COPY;  Service: Endoscopy;  Laterality: N/A; repeat 3 years, tubular adenoma   DENTAL SURGERY     Patient had implants   FLEXIBLE BRONCHOSCOPY Left 05/19/2022   Procedure: FLEXIBLE BRONCHOSCOPY;  Surgeon:  Tyler Pita, MD;  Location: [facility] ORS;  Service: Cardiopulmonary;  Laterality: Left;   GUM SURGERY     IR IMAGING GUIDED PORT INSERTION  06/13/2022   L4 - S1 decompression Left    PROSTATE BIOPSY     TONSILLECTOMY AND ADENOIDECTOMY  age 5   VIDEO BRONCHOSCOPY WITH ENDOBRONCHIAL ULTRASOUND Left 05/19/2022   Procedure: VIDEO BRONCHOSCOPY WITH ENDOBRONCHIAL ULTRASOUND;  Surgeon: Tyler Pita, MD;  Location: [facility] ORS;  Service: Cardiopulmonary;  Laterality: Left;   XI ROBOTIC ASSISTED INGUINAL HERNIA REPAIR WITH MESH Right 10/21/2021   Procedure: XI ROBOTIC ASSISTED INGUINAL HERNIA REPAIR WITH  MESH;  Surgeon: Ronny Bacon, MD;  Location: [facility] ORS;  Service: General;  Laterality: Right;    Home Medications:  Allergies as of 09/15/2022       Reactions   Cefdinir Nausea Only   hypersensitivity to smell   Doxycycline    Sun sensitivity   Prednisone    Hallucinations    Sertraline Other (See Comments)   Hallucinations         Medication List        Accurate as of September 15, 2022  1:00 PM. If you have any questions, ask your nurse or doctor.          amLODipine 5 MG tablet Commonly known as: NORVASC Take 1 tablet (5 mg total) by mouth daily.   benazepril 40 MG tablet Commonly known as: LOTENSIN TAKE 1 TABLET EVERY DAY   carvedilol 6.25 MG tablet Commonly known as: COREG Take 1 tablet (6.25 mg total) by mouth 2 (two) times daily.   doxazosin 8 MG tablet Commonly known as: CARDURA Take 1 tablet (8 mg total) by mouth daily.   finasteride 5 MG tablet Commonly known as: PROSCAR Take 1 tablet (5 mg total) by mouth daily.   gabapentin 300 MG capsule Commonly known as: NEURONTIN TAKE 1 CAPSULE EVERY MORNING, 1 CAPSULE IN THE AFTERNOON AND 2 CAPSULES AT BEDTIME   Magnesium 500 MG Tabs Take 500 mg by mouth daily.   Multi-Vitamin tablet Take 1 tablet by mouth daily.   omeprazole 20 MG capsule Commonly known as: PRILOSEC Take 20 mg by mouth daily.   oxyCODONE 5 MG immediate release tablet Commonly known as: Oxy IR/ROXICODONE Take by mouth.   triamterene-hydrochlorothiazide 37.5-25 MG tablet Commonly known as: MAXZIDE-25 TAKE 1/2 TABLET EVERY DAY   VITAMIN B 12 PO Take 1 capsule by mouth 2 (two) times daily. 1000 mcg each capsule   warfarin 4 MG tablet Commonly known as: COUMADIN Take as directed by the anticoagulation clinic. If you are unsure how to take this medication, talk to your nurse or doctor. Original instructions: Take 1 tablet (4 mg total) by mouth daily.        Allergies:  Allergies  Allergen Reactions   Cefdinir Nausea  Only    hypersensitivity to smell   Doxycycline     Sun sensitivity   Prednisone     Hallucinations    Sertraline Other (See Comments)    Hallucinations     Family History: Family History  Problem Relation Age of Onset   Cancer Mother        secondary to cancinoma of the jaw from her dipping snuff.   Heart attack Father    CAD Father    Hypertension Sister    Diabetes Son        Type I diabetes   Prostate cancer Neg Hx    Kidney cancer Neg Hx  Social History:  reports that he quit smoking about 51 years ago. His smoking use included cigarettes. He has never been exposed to tobacco smoke. He has never used smokeless tobacco. He reports that he does not currently use alcohol. He reports that he does not use drugs.   Physical Exam: BP 115/69   Pulse 87   Ht 5' 9"  (1.753 m)   Wt 160 lb (72.6 kg)   BMI 23.63 kg/m   Constitutional:  Alert and oriented, No acute distress. HEENT: [place] AT Respiratory: Normal respiratory effort, no increased work of breathing. Psychiatric: Normal mood and affect.   Pertinent Imaging: CT images were personally reviewed and interpreted.  Estimated bladder volume was calculated at 288 mL   Assessment & Plan:    1.  Complex right renal cyst  Bosniak 3/possible Bosniak 4 renal cyst which is stable We discussed this may represent a cystic renal neoplasm however active surveillance is an option for masses < 4 cm.  The incidence of metastasis without growth in these lesions is <5% He desires to continue surveillance.  He has regular abdominal imaging through oncology.  Recommend follow-up CT or MRI in 6 months  2.  BPH with LUTS Stable and clinically improved PVR today 0 mL   Abbie Sons, MD  [facility] [address] [place], [place] [address] [phone]

## 2022-09-17 ENCOUNTER — Encounter: Payer: Self-pay | Admitting: Urology

## 2022-09-19 ENCOUNTER — Other Ambulatory Visit: Payer: Self-pay | Admitting: Family Medicine

## 2022-09-24 ENCOUNTER — Other Ambulatory Visit: Payer: Self-pay | Admitting: Family Medicine

## 2022-09-24 ENCOUNTER — Ambulatory Visit (INDEPENDENT_AMBULATORY_CARE_PROVIDER_SITE_OTHER): Payer: Medicare HMO

## 2022-09-24 DIAGNOSIS — I480 Paroxysmal atrial fibrillation: Secondary | ICD-10-CM

## 2022-09-24 LAB — POCT INR: INR: 3.9 — AB (ref 2.0–3.0)

## 2022-09-24 NOTE — Patient Instructions (Signed)
Description   Hold tonight and resume 5mg  daily. Return in 2 weeks.

## 2022-09-25 ENCOUNTER — Other Ambulatory Visit: Payer: Self-pay | Admitting: *Deleted

## 2022-09-25 ENCOUNTER — Ambulatory Visit
Admission: RE | Admit: 2022-09-25 | Discharge: 2022-09-25 | Disposition: A | Discharge status: Home / Self Care | Payer: Medicare HMO | Source: Ambulatory Visit | Attending: Radiation Oncology | Admitting: Radiation Oncology

## 2022-09-25 ENCOUNTER — Encounter: Payer: Self-pay | Admitting: Radiation Oncology

## 2022-09-25 VITALS — BP 152/88 | HR 71 | Temp 97.2°F | Resp 16 | Ht 69.0 in | Wt 167.0 lb

## 2022-09-25 DIAGNOSIS — R918 Other nonspecific abnormal finding of lung field: Secondary | ICD-10-CM

## 2022-09-25 DIAGNOSIS — C3412 Malignant neoplasm of upper lobe, left bronchus or lung: Secondary | ICD-10-CM | POA: Insufficient documentation

## 2022-09-25 NOTE — Progress Notes (Signed)
Radiation Oncology Follow up Note  Name: Casey Gallegos   Date:   09/25/2022 MRN:  [id number] DOB: 1934/09/11    This 88 y.o. 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  REFERRING PROVIDER: Donnal Moat*  HPI: Patient is an 88 year old male now out 1 month having completed concurrent chemoradiation therapy for stage IIIb squamous cell carcinoma of the left upper lobe.  Seen today in routine follow-up he is doing extremely well specifically denies hemoptysis chest tightness does have a mild cough.  He is having no dysphagia.  He is currently on.  Durvalumab which she is tolerating well monthly infusions.  COMPLICATIONS OF TREATMENT: none  FOLLOW UP COMPLIANCE: keeps appointments   PHYSICAL EXAM:  BP (!) 152/88 (BP Location: Right Arm, Patient Position: Sitting, Cuff Size: Normal)   Pulse 71   Temp (!) 97.2 F (36.2 C) (Tympanic)   Resp 16   Ht 5\' 9"  (1.753 m) Comment: Stated Ht  Wt 167 lb (75.8 kg)   BMI 24.66 kg/m  Well-developed well-nourished patient in NAD. HEENT reveals PERLA, EOMI, discs not visualized.  Oral cavity is clear. No oral mucosal lesions are identified. Neck is clear without evidence of cervical or supraclavicular adenopathy. Lungs are clear to A&P. Cardiac examination is essentially unremarkable with regular rate and rhythm without murmur rub or thrill. Abdomen is benign with no organomegaly or masses noted. Motor sensory and DTR levels are equal and symmetric in the upper and lower extremities. Cranial nerves II through XII are grossly intact. Proprioception is intact. No peripheral adenopathy or edema is identified. No motor or sensory levels are noted. Crude visual fields are within normal range.  RADIOLOGY RESULTS: No current films for review  PLAN: Present time patient is doing well extremely  low side effect profile he commended continues on immune immunotherapy through medical oncology.  I have asked to see him back in 4 months for follow-up.  Anticipate another scan prior to that visit.  Patient is to call with any concerns.  I would like to take this opportunity to thank you for allowing me to participate in the care of your patient.Noreene Filbert, MD

## 2022-09-26 ENCOUNTER — Inpatient Hospital Stay (HOSPITAL_BASED_OUTPATIENT_CLINIC_OR_DEPARTMENT_OTHER): Payer: Medicare HMO | Admitting: Oncology

## 2022-09-26 ENCOUNTER — Encounter: Payer: Self-pay | Admitting: Oncology

## 2022-09-26 ENCOUNTER — Inpatient Hospital Stay: Payer: Medicare HMO | Attending: Oncology

## 2022-09-26 ENCOUNTER — Inpatient Hospital Stay: Payer: Medicare HMO

## 2022-09-26 VITALS — BP 154/85 | HR 66 | Temp 99.3°F | Resp 16 | Wt 165.0 lb

## 2022-09-26 DIAGNOSIS — C3412 Malignant neoplasm of upper lobe, left bronchus or lung: Secondary | ICD-10-CM | POA: Diagnosis not present

## 2022-09-26 DIAGNOSIS — D709 Neutropenia, unspecified: Secondary | ICD-10-CM | POA: Insufficient documentation

## 2022-09-26 DIAGNOSIS — Z5112 Encounter for antineoplastic immunotherapy: Secondary | ICD-10-CM | POA: Insufficient documentation

## 2022-09-26 LAB — CBC WITH DIFFERENTIAL/PLATELET
Abs Immature Granulocytes: 0.01 10*3/uL (ref 0.00–0.07)
Basophils Absolute: 0 10*3/uL (ref 0.0–0.1)
Basophils Relative: 1 %
Eosinophils Absolute: 0.1 10*3/uL (ref 0.0–0.5)
Eosinophils Relative: 3 %
HCT: 28.6 % — ABNORMAL LOW (ref 39.0–52.0)
Hemoglobin: 9.7 g/dL — ABNORMAL LOW (ref 13.0–17.0)
Immature Granulocytes: 0 %
Lymphocytes Relative: 21 %
Lymphs Abs: 0.6 10*3/uL — ABNORMAL LOW (ref 0.7–4.0)
MCH: 34.3 pg — ABNORMAL HIGH (ref 26.0–34.0)
MCHC: 33.9 g/dL (ref 30.0–36.0)
MCV: 101.1 fL — ABNORMAL HIGH (ref 80.0–100.0)
Monocytes Absolute: 0.5 10*3/uL (ref 0.1–1.0)
Monocytes Relative: 17 %
Neutro Abs: 1.6 10*3/uL — ABNORMAL LOW (ref 1.7–7.7)
Neutrophils Relative %: 58 %
Platelets: 120 10*3/uL — ABNORMAL LOW (ref 150–400)
RBC: 2.83 MIL/uL — ABNORMAL LOW (ref 4.22–5.81)
RDW: 16.3 % — ABNORMAL HIGH (ref 11.5–15.5)
WBC: 2.7 10*3/uL — ABNORMAL LOW (ref 4.0–10.5)
nRBC: 0 % (ref 0.0–0.2)

## 2022-09-26 LAB — COMPREHENSIVE METABOLIC PANEL
ALT: 15 U/L (ref 0–44)
AST: 22 U/L (ref 15–41)
Albumin: 3.5 g/dL (ref 3.5–5.0)
Alkaline Phosphatase: 62 U/L (ref 38–126)
Anion gap: 6 (ref 5–15)
BUN: 20 mg/dL (ref 8–23)
CO2: 24 mmol/L (ref 22–32)
Calcium: 8.5 mg/dL — ABNORMAL LOW (ref 8.9–10.3)
Chloride: 109 mmol/L (ref 98–111)
Creatinine, Ser: 1.05 mg/dL (ref 0.61–1.24)
GFR, Estimated: >60 mL/min (ref >60)
Glucose, Bld: 113 mg/dL — ABNORMAL HIGH (ref 70–99)
Potassium: 4.1 mmol/L (ref 3.5–5.1)
Sodium: 139 mmol/L (ref 135–145)
Total Bilirubin: 1 mg/dL (ref 0.3–1.2)
Total Protein: 5.8 g/dL — ABNORMAL LOW (ref 6.5–8.1)

## 2022-09-26 MED ORDER — SODIUM CHLORIDE 0.9 % IV SOLN
1500.0000 mg | Freq: Once | INTRAVENOUS | Status: AC
  Administered 2022-09-26: 1500 mg via INTRAVENOUS
  Filled 2022-09-26: qty 30

## 2022-09-26 MED ORDER — SODIUM CHLORIDE 0.9 % IV SOLN
Freq: Once | INTRAVENOUS | Status: AC
  Filled 2022-09-26: qty 250

## 2022-09-26 MED ORDER — HEPARIN SOD (PORK) LOCK FLUSH 100 UNIT/ML IV SOLN
500.0000 [IU] | Freq: Once | INTRAVENOUS | Status: AC | PRN
  Administered 2022-09-26: 500 [IU]
  Filled 2022-09-26: qty 5

## 2022-09-26 NOTE — Progress Notes (Signed)
Pt in for follow up, denies any concerns today. 

## 2022-09-26 NOTE — Patient Instructions (Signed)
[facility]  Discharge Instructions: Thank you for choosing [facility] to provide your oncology and hematology care.  If you have a lab appointment with the [facility], please go directly to the [facility] and check in at the registration area.  Wear comfortable clothing and clothing appropriate for easy access to any Portacath or PICC line.   We strive to give you quality time with your provider. You may need to reschedule your appointment if you arrive late (15 or more minutes).  Arriving late affects you and other patients whose appointments are after yours.  Also, if you miss three or more appointments without notifying the office, you may be dismissed from the clinic at the provider's discretion.      For prescription refill requests, have your pharmacy contact our office and allow 72 hours for refills to be completed.    Today you received the following chemotherapy and/or immunotherapy agents Imfinzi      To help prevent nausea and vomiting after your treatment, we encourage you to take your nausea medication as directed.  BELOW ARE SYMPTOMS THAT SHOULD BE REPORTED IMMEDIATELY: *FEVER GREATER THAN 100.4 F (38 C) OR HIGHER *CHILLS OR SWEATING *NAUSEA AND VOMITING THAT IS NOT CONTROLLED WITH YOUR NAUSEA MEDICATION *UNUSUAL SHORTNESS OF BREATH *UNUSUAL BRUISING OR BLEEDING *URINARY PROBLEMS (pain or burning when urinating, or frequent urination) *BOWEL PROBLEMS (unusual diarrhea, constipation, pain near the anus) TENDERNESS IN MOUTH AND THROAT WITH OR WITHOUT PRESENCE OF ULCERS (sore throat, sores in mouth, or a toothache) UNUSUAL RASH, SWELLING OR PAIN  UNUSUAL VAGINAL DISCHARGE OR ITCHING   Items with * indicate a potential emergency and should be followed up as soon as possible or go to the Emergency Department if any problems should occur.  Please show the CHEMOTHERAPY ALERT CARD or IMMUNOTHERAPY ALERT CARD at check-in to  the Emergency Department and triage nurse.  Should you have questions after your visit or need to cancel or reschedule your appointment, please contact [facility]  [phone] and follow the prompts.  Office hours are 8:00 a.m. to 4:30 p.m. Monday - Friday. Please note that voicemails left after 4:00 p.m. may not be returned until the following business day.  We are closed weekends and major holidays. You have access to a nurse at all times for urgent questions. Please call the main number to the clinic [phone] and follow the prompts.  For any non-urgent questions, you may also contact your provider using MyChart. We now offer e-Visits for anyone 47 and older to request care online for non-urgent symptoms. For details visit [email or web address].   Also download the MyChart app! Go to the app store, search "MyChart", open the app, select [facility], and log in with your MyChart username and password.

## 2022-09-27 ENCOUNTER — Encounter: Payer: Self-pay | Admitting: Oncology

## 2022-09-27 NOTE — Progress Notes (Signed)
Hematology/Oncology Consult note [facility]  Telephone:([phone] [phone]  Patient Care Team: [name], MD as PCP - General (Family Medicine) Hollice Espy, MD as Consulting Physician (Urology) Corey Skains, MD as Consulting Physician (Cardiology) Leandrew Koyanagi, MD as Referring Physician (Ophthalmology) Ralene Bathe, MD (Dermatology) Telford Nab, RN as Oncology Nurse Navigator Sindy Guadeloupe, MD as Consulting Physician (Oncology) Tyler Pita, MD as Consulting Physician (Pulmonary Disease)   Name of the [name]  [id number]  07-30-1935   Date of visit: 09/27/22  Diagnosis- 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  He was found to have a left upper lobe lung mass measuring 5.5 x 3.7 x 2.8 cm.  It was confluent with the left hilar lymph node.  Enlarged AP window lymph node measuring 16 mm.  Enlarged left paratracheal lymph node.  Low-attenuation 7 mm lesion in the liver.  7 mm cyst in the uncinate process of the pancreas for which follow-up was not recommended.  Bilateral kidney cysts.  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  Prostatic enlargement.   PET CT is can showed hypermetabolic left upper lobe lung mass measuring 5.4 x 3.9 cm with an SUV of 15.1.  Ipsilateral mediastinal lymph node metastases with index lymph node measuring 1.7 with an SUV of 9.8.  No evidence of distant metastatic disease.  Biopsy of the left upper lobe as well as station 4R lymph node was consistent with squamous cell carcinoma   NGS testing did not show any actionable mutations.  PD-L1 35%.  MSI  stable.  MRI brain was negative for metastatic disease   Patient completed concurrent chemoradiation with weekly CarboTaxol chemotherapy in December 2023.  Scans following that showed partial response to treatment.    Interval history-he is doing well at this time and denies any complaints.  He tolerated cycle 1 of durvalumab well  ECOG PS- 1 Pain scale- 0   Review of systems- Review of Systems  Constitutional:  Negative for chills, fever, malaise/fatigue and weight loss.  HENT:  Negative for congestion, ear discharge and nosebleeds.   Eyes:  Negative for blurred vision.  Respiratory:  Negative for cough, hemoptysis, sputum production, shortness of breath and wheezing.   Cardiovascular:  Negative for chest pain, palpitations, orthopnea and claudication.  Gastrointestinal:  Negative for abdominal pain, blood in stool, constipation, diarrhea, heartburn, melena, nausea and vomiting.  Genitourinary:  Negative for dysuria, flank pain, frequency, hematuria and urgency.  Musculoskeletal:  Negative for back pain, joint pain and myalgias.  Skin:  Negative for rash.  Neurological:  Negative for dizziness, tingling, focal weakness, seizures, weakness and headaches.  Endo/Heme/Allergies:  Does not bruise/bleed easily.  Psychiatric/Behavioral:  Negative for depression and suicidal ideas. The patient does not have insomnia.       Allergies  Allergen Reactions   Cefdinir Nausea Only    hypersensitivity to smell   Doxycycline     Sun sensitivity   Prednisone     Hallucinations    Sertraline Other (See Comments)    Hallucinations      Past Medical History:  Diagnosis Date   A-fib ([place])    a.)  CHA2DS2-VASc = 6 (age x2, HTN, DVT x2, vascular disease history). b.) rate/rhythm maintained on oral carvedilol; chronically anticoagulated using daily warfarin.   Actinic keratosis    Anemia    Aortic atherosclerosis (HCC)    Bilateral renal cysts    a.)  CT abdomen pelvis 04/24/2022: Enhancing  septation of the lateral aspect of the mid RIGHT kidney measuring 2.7 x 2.8 cm concerning for cystic renal neoplasm   Bladder calculi    BPH (benign prostatic hyperplasia)    DDD (degenerative disc disease), lumbar    a.) s/p LEFT laminectomy L5   Diverticulosis    DOE (dyspnea on exertion)    DVT (deep venous thrombosis) (HCC)    Elevated PSA    GERD (gastroesophageal reflux disease)    H/O degenerative disc disease    HLD (hyperlipidemia)    Hypertension    Lipoma of arm    Lipomatosis    Long term current use of anticoagulant    a.) warfarin   Lung cancer ([place])    Mass of upper lobe of left lung 04/24/2022   a.)  CT chest 04/24/2022: Multilobulated perihilar mass measuring 5.3 x 3.7 x 2.8 cm with associated LEFT hilar and mediastinal LAD; b.)  PET CT 93/79/0240: Hypermetabolic LEFT upper lobe lung mass (max SUV 15.1); ipsilateral nodal metastasis --> presuming a NSC histology, 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   NICM (nonischemic cardiomyopathy) ([place]) 11/22/2015   a.) TTE 11/22/2015: EF 40%. b.) TTE 12/27/2018: EF 45%. c.) TTE 08/30/2020: EF 45-50%.   Night sweats    Obesity    Osteoarthritis    RBBB (right bundle branch block)    Right inguinal hernia    VHD (valvular heart disease) 11/22/2015   a.) TTE 11/22/2015: EF 40%; mod MR, mod-sev TR, triv PR; mod BAE, mild RV enlargement. b.) TTE 12/27/2018: EF 45%; mod MR, mod-sev TR, triv PR; mod BAE, mild RV enlargement. c.) TTE 08/30/2020: EF 45-50%; mild MR/TR.     Past Surgical History:  Procedure Laterality Date   Bilateral Radical Keratotomy Bilateral 1997   COLONOSCOPY WITH PROPOFOL N/A 01/11/2016   Procedure: COLONOSCOPY WITH PROPOFOL;  Surgeon: Manya Silvas, MD;  Location: [facility] ENDOSCOPY;  Service: Endoscopy;  Laterality: N/A; repeat 3 years, tubular adenoma   DENTAL SURGERY     Patient had implants   FLEXIBLE BRONCHOSCOPY Left 05/19/2022   Procedure: FLEXIBLE  BRONCHOSCOPY;  Surgeon: Tyler Pita, MD;  Location: [facility] ORS;  Service: Cardiopulmonary;  Laterality: Left;   GUM SURGERY     IR IMAGING GUIDED PORT INSERTION  06/13/2022   L4 - S1 decompression Left    PROSTATE BIOPSY     TONSILLECTOMY AND ADENOIDECTOMY  age 88   VIDEO BRONCHOSCOPY WITH ENDOBRONCHIAL ULTRASOUND Left 05/19/2022   Procedure: VIDEO BRONCHOSCOPY WITH ENDOBRONCHIAL ULTRASOUND;  Surgeon: Tyler Pita, MD;  Location: [facility] ORS;  Service: Cardiopulmonary;  Laterality: Left;   XI ROBOTIC ASSISTED INGUINAL HERNIA REPAIR WITH MESH Right 10/21/2021   Procedure: XI ROBOTIC ASSISTED INGUINAL HERNIA REPAIR WITH MESH;  Surgeon: Ronny Bacon, MD;  Location: [facility] ORS;  Service: General;  Laterality: Right;    Social History   Socioeconomic History   Marital status: Married    Spouse name: Doris   Number of children: 3   Years of education: Not on file   Highest education level: Associate degree: occupational, Hotel manager, or vocational program  Occupational History   Occupation: retired  Tobacco Use  Smoking status: Former    Types: Cigarettes    Quit date: 08/26/1971    Years since quitting: 51.1    Passive exposure: Never   Smokeless tobacco: Never   Tobacco comments:    Smoked for about 20 years.  Vaping Use   Vaping Use: Never used  Substance and Sexual Activity   Alcohol use: Not Currently   Drug use: No   Sexual activity: Not Currently  Other Topics Concern   Not on file  Social History Narrative   Not on file   Social Determinants of Health   Financial Resource Strain: Low Risk  (12/18/2021)   Overall Financial Resource Strain (CARDIA)    Difficulty of Paying Living Expenses: Not hard at all  Food Insecurity: No Food Insecurity (12/18/2021)   Hunger Vital Sign    Worried About Running Out of Food in the Last Year: Never true    Ran Out of Food in the Last Year: Never true  Transportation Needs: No Transportation Needs (12/18/2021)   PRAPARE -  Hydrologist (Medical): No    Lack of Transportation (Non-Medical): No  Physical Activity: Sufficiently Active (12/18/2021)   Exercise Vital Sign    Days of Exercise per Week: 7 days    Minutes of Exercise per Session: 50 min  Stress: No Stress Concern Present (12/18/2021)   [place]    Feeling of Stress : Only a little  Social Connections: Moderately Integrated (12/18/2021)   Social Connection and Isolation Panel [NHANES]    Frequency of Communication with Friends and Family: Twice a week    Frequency of Social Gatherings with Friends and Family: Once a week    Attends Religious Services: More than 4 times per year    Active Member of [employer] or Organizations: No    Attends Archivist Meetings: Never    Marital Status: Married  Human resources officer Violence: Not At Risk (12/18/2021)   Humiliation, Afraid, Rape, and Kick questionnaire    Fear of Current or Ex-Partner: No    Emotionally Abused: No    Physically Abused: No    Sexually Abused: No    Family History  Problem Relation Age of Onset   Cancer Mother        secondary to cancinoma of the jaw from her dipping snuff.   Heart attack Father    CAD Father    Hypertension Sister    Diabetes Son        Type I diabetes   Prostate cancer Neg Hx    Kidney cancer Neg Hx      Current Outpatient Medications:    amLODipine (NORVASC) 5 MG tablet, TAKE 1 TABLET (5 MG TOTAL) BY MOUTH DAILY., Disp: 90 tablet, Rfl: 3   benazepril (LOTENSIN) 40 MG tablet, TAKE 1 TABLET EVERY DAY, Disp: 90 tablet, Rfl: 3   carvedilol (COREG) 6.25 MG tablet, TAKE 1 TABLET (6.25 MG TOTAL) BY MOUTH 2 (TWO) TIMES DAILY., Disp: 180 tablet, Rfl: 3   Cyanocobalamin (VITAMIN B 12 PO), Take 1 capsule by mouth 2 (two) times daily. 1000 mcg each capsule, Disp: , Rfl:    doxazosin (CARDURA) 8 MG tablet, TAKE 1 TABLET (8 MG TOTAL) BY MOUTH DAILY., Disp: 90 tablet, Rfl:  3   finasteride (PROSCAR) 5 MG tablet, Take 1 tablet (5 mg total) by mouth daily., Disp: 90 tablet, Rfl: 3   gabapentin (NEURONTIN) 300 MG capsule, TAKE 1 CAPSULE EVERY  MORNING, 1 CAPSULE IN THE AFTERNOON AND 2 CAPSULES AT BEDTIME, Disp: 360 capsule, Rfl: 2   Magnesium 500 MG TABS, Take 500 mg by mouth daily., Disp: , Rfl:    Multiple Vitamin (MULTI-VITAMIN) tablet, Take 1 tablet by mouth daily. , Disp: , Rfl:    omeprazole (PRILOSEC) 20 MG capsule, Take 20 mg by mouth daily., Disp: , Rfl:    oxyCODONE (OXY IR/ROXICODONE) 5 MG immediate release tablet, Take by mouth., Disp: , Rfl:    triamterene-hydrochlorothiazide (MAXZIDE-25) 37.5-25 MG tablet, TAKE 1/2 TABLET EVERY DAY, Disp: 45 tablet, Rfl: 0   warfarin (COUMADIN) 4 MG tablet, Take 1 tablet (4 mg total) by mouth daily. (Patient taking differently: Take 4 mg by mouth daily. Pt states is currently taking 5 mg.), Disp: 90 tablet, Rfl: 0 No current facility-administered medications for this visit.  Facility-Administered Medications Ordered in Other Visits:    heparin lock flush 100 UNIT/ML injection, , , ,   Physical exam:  Vitals:   09/26/22 1408  BP: (!) 154/85  Pulse: 66  Resp: 16  Temp: 99.3 F (37.4 C)  TempSrc: Tympanic  SpO2: 100%  Weight: 165 lb (74.8 kg)   Physical Exam Cardiovascular:     Rate and Rhythm: Normal rate and regular rhythm.     Heart sounds: Normal heart sounds.  Pulmonary:     Effort: Pulmonary effort is normal.     Breath sounds: Normal breath sounds.  Abdominal:     General: Bowel sounds are normal.     Palpations: Abdomen is soft.  Skin:    General: Skin is warm and dry.  Neurological:     Mental Status: He is alert and oriented to person, place, and time.         Latest Ref Rng & Units 09/26/2022    1:23 PM  CMP  Glucose 70 - 99 mg/dL 113   BUN 8 - 23 mg/dL 20   Creatinine 0.61 - 1.24 mg/dL 1.05   Sodium 135 - 145 mmol/L 139   Potassium 3.5 - 5.1 mmol/L 4.1   Chloride 98 - 111 mmol/L 109    CO2 22 - 32 mmol/L 24   Calcium 8.9 - 10.3 mg/dL 8.5   Total Protein 6.5 - 8.1 g/dL 5.8   Total Bilirubin 0.3 - 1.2 mg/dL 1.0   Alkaline Phos 38 - 126 U/L 62   AST 15 - 41 U/L 22   ALT 0 - 44 U/L 15       Latest Ref Rng & Units 09/26/2022    1:23 PM  CBC  WBC 4.0 - 10.5 K/uL 2.7   Hemoglobin 13.0 - 17.0 g/dL 9.7   Hematocrit 39.0 - 52.0 % 28.6   Platelets 150 - 400 K/uL 120     Assessment and plan- Patient is a 88 y.o. 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  He is s/p weekly CarboTaxol chemotherapy with concurrent radiation therapy.  He is here for on treatment assessment prior to cycle 2 of adjuvant durvalumab  Counts okay to proceed with cycle 2 of durvalumab today.  I will see him back in 2 weeks for cycle 3.  Plan is to complete 1 year of adjuvant treatment.  Patient still has some mild leukopenia/neutropenia likely secondary to recent chemotherapy and hopefully his counts should recover in the next month or 2.   Visit Diagnosis 1. Primary malignant neoplasm of left upper lobe of lung ([place])  2. Encounter for antineoplastic immunotherapy      Dr. Randa Evens, MD, MPH [facility] at [facility] [phone] 09/27/2022 6:17 PM

## 2022-10-08 ENCOUNTER — Other Ambulatory Visit: Payer: Self-pay | Admitting: Family Medicine

## 2022-10-08 ENCOUNTER — Ambulatory Visit (INDEPENDENT_AMBULATORY_CARE_PROVIDER_SITE_OTHER): Payer: Medicare HMO

## 2022-10-08 DIAGNOSIS — I480 Paroxysmal atrial fibrillation: Secondary | ICD-10-CM | POA: Diagnosis not present

## 2022-10-08 DIAGNOSIS — I10 Essential (primary) hypertension: Secondary | ICD-10-CM

## 2022-10-08 LAB — POCT INR
INR: 3.4 — AB (ref 2.0–3.0)
POC INR: 3.4
PT: 41.2

## 2022-10-08 NOTE — Patient Instructions (Signed)
Description   Hold 2 days and resume 5mg  daily. Return in 2 weeks.

## 2022-10-22 ENCOUNTER — Ambulatory Visit (INDEPENDENT_AMBULATORY_CARE_PROVIDER_SITE_OTHER): Payer: Medicare HMO

## 2022-10-22 DIAGNOSIS — I480 Paroxysmal atrial fibrillation: Secondary | ICD-10-CM

## 2022-10-22 LAB — POCT INR
INR: 2.7 (ref 2.0–3.0)
POC INR: 2.7
PT: 32.2

## 2022-10-22 NOTE — Patient Instructions (Signed)
Description   Hold 2 days and resume '5mg'$  daily. Return in 4 weeks.

## 2022-10-24 ENCOUNTER — Inpatient Hospital Stay: Payer: Medicare HMO

## 2022-10-24 ENCOUNTER — Inpatient Hospital Stay: Payer: Medicare HMO | Attending: Oncology

## 2022-10-24 ENCOUNTER — Other Ambulatory Visit: Payer: Self-pay | Admitting: *Deleted

## 2022-10-24 ENCOUNTER — Inpatient Hospital Stay (HOSPITAL_BASED_OUTPATIENT_CLINIC_OR_DEPARTMENT_OTHER): Payer: Medicare HMO | Admitting: Oncology

## 2022-10-24 VITALS — BP 158/88 | HR 68 | Temp 95.8°F | Resp 18 | Ht 68.0 in | Wt 163.8 lb

## 2022-10-24 DIAGNOSIS — D649 Anemia, unspecified: Secondary | ICD-10-CM | POA: Diagnosis not present

## 2022-10-24 DIAGNOSIS — C3412 Malignant neoplasm of upper lobe, left bronchus or lung: Secondary | ICD-10-CM | POA: Diagnosis not present

## 2022-10-24 DIAGNOSIS — C771 Secondary and unspecified malignant neoplasm of intrathoracic lymph nodes: Secondary | ICD-10-CM | POA: Insufficient documentation

## 2022-10-24 DIAGNOSIS — Z5112 Encounter for antineoplastic immunotherapy: Secondary | ICD-10-CM

## 2022-10-24 DIAGNOSIS — Z79620 Long term (current) use of immunosuppressive biologic: Secondary | ICD-10-CM | POA: Diagnosis not present

## 2022-10-24 LAB — CBC WITH DIFFERENTIAL/PLATELET
Abs Immature Granulocytes: 0.02 10*3/uL (ref 0.00–0.07)
Basophils Absolute: 0 10*3/uL (ref 0.0–0.1)
Basophils Relative: 1 %
Eosinophils Absolute: 0.1 10*3/uL (ref 0.0–0.5)
Eosinophils Relative: 3 %
HCT: 31.5 % — ABNORMAL LOW (ref 39.0–52.0)
Hemoglobin: 10.6 g/dL — ABNORMAL LOW (ref 13.0–17.0)
Immature Granulocytes: 1 %
Lymphocytes Relative: 14 %
Lymphs Abs: 0.6 10*3/uL — ABNORMAL LOW (ref 0.7–4.0)
MCH: 34.6 pg — ABNORMAL HIGH (ref 26.0–34.0)
MCHC: 33.7 g/dL (ref 30.0–36.0)
MCV: 102.9 fL — ABNORMAL HIGH (ref 80.0–100.0)
Monocytes Absolute: 0.4 10*3/uL (ref 0.1–1.0)
Monocytes Relative: 11 %
Neutro Abs: 2.9 10*3/uL (ref 1.7–7.7)
Neutrophils Relative %: 70 %
Platelets: 122 10*3/uL — ABNORMAL LOW (ref 150–400)
RBC: 3.06 MIL/uL — ABNORMAL LOW (ref 4.22–5.81)
RDW: 14.1 % (ref 11.5–15.5)
WBC: 4.1 10*3/uL (ref 4.0–10.5)
nRBC: 0 % (ref 0.0–0.2)

## 2022-10-24 LAB — COMPREHENSIVE METABOLIC PANEL
ALT: 14 U/L (ref 0–44)
AST: 21 U/L (ref 15–41)
Albumin: 3.6 g/dL (ref 3.5–5.0)
Alkaline Phosphatase: 64 U/L (ref 38–126)
Anion gap: 6 (ref 5–15)
BUN: 22 mg/dL (ref 8–23)
CO2: 23 mmol/L (ref 22–32)
Calcium: 8.7 mg/dL — ABNORMAL LOW (ref 8.9–10.3)
Chloride: 106 mmol/L (ref 98–111)
Creatinine, Ser: 1.05 mg/dL (ref 0.61–1.24)
GFR, Estimated: >60 mL/min (ref >60)
Glucose, Bld: 121 mg/dL — ABNORMAL HIGH (ref 70–99)
Potassium: 3.9 mmol/L (ref 3.5–5.1)
Sodium: 135 mmol/L (ref 135–145)
Total Bilirubin: 1 mg/dL (ref 0.3–1.2)
Total Protein: 6.3 g/dL — ABNORMAL LOW (ref 6.5–8.1)

## 2022-10-24 LAB — FERRITIN: Ferritin: 113 ng/mL (ref 24–336)

## 2022-10-24 LAB — TSH: TSH: 1.592 u[IU]/mL (ref 0.350–4.500)

## 2022-10-24 LAB — IRON AND TIBC
Iron: 61 ug/dL (ref 45–182)
Saturation Ratios: 23 % (ref 17.9–39.5)
TIBC: 265 ug/dL (ref 250–450)
UIBC: 204 ug/dL

## 2022-10-24 LAB — FOLATE: Folate: 27 ng/mL (ref >5.9)

## 2022-10-24 LAB — VITAMIN B12: Vitamin B-12: 1718 pg/mL — ABNORMAL HIGH (ref 180–914)

## 2022-10-24 MED ORDER — HEPARIN SOD (PORK) LOCK FLUSH 100 UNIT/ML IV SOLN
500.0000 [IU] | Freq: Once | INTRAVENOUS | Status: DC | PRN
  Filled 2022-10-24: qty 5

## 2022-10-24 MED ORDER — SODIUM CHLORIDE 0.9 % IV SOLN
1500.0000 mg | Freq: Once | INTRAVENOUS | Status: AC
  Administered 2022-10-24: 1500 mg via INTRAVENOUS
  Filled 2022-10-24: qty 30

## 2022-10-24 MED ORDER — SODIUM CHLORIDE 0.9 % IV SOLN
Freq: Once | INTRAVENOUS | Status: AC
  Filled 2022-10-24: qty 250

## 2022-10-24 MED ORDER — SODIUM CHLORIDE 0.9% FLUSH
10.0000 mL | INTRAVENOUS | Status: DC | PRN
  Filled 2022-10-24: qty 10

## 2022-10-24 NOTE — Patient Instructions (Signed)
[facility]  Discharge Instructions: Thank you for choosing [facility] to provide your oncology and hematology care.  If you have a lab appointment with the [facility], please go directly to the [facility] and check in at the registration area.  Wear comfortable clothing and clothing appropriate for easy access to any Portacath or PICC line.   We strive to give you quality time with your provider. You may need to reschedule your appointment if you arrive late (15 or more minutes).  Arriving late affects you and other patients whose appointments are after yours.  Also, if you miss three or more appointments without notifying the office, you may be dismissed from the clinic at the provider's discretion.      For prescription refill requests, have your pharmacy contact our office and allow 72 hours for refills to be completed.    Today you received the following chemotherapy and/or immunotherapy agents IMFINZI      To help prevent nausea and vomiting after your treatment, we encourage you to take your nausea medication as directed.  BELOW ARE SYMPTOMS THAT SHOULD BE REPORTED IMMEDIATELY: *FEVER GREATER THAN 100.4 F (38 C) OR HIGHER *CHILLS OR SWEATING *NAUSEA AND VOMITING THAT IS NOT CONTROLLED WITH YOUR NAUSEA MEDICATION *UNUSUAL SHORTNESS OF BREATH *UNUSUAL BRUISING OR BLEEDING *URINARY PROBLEMS (pain or burning when urinating, or frequent urination) *BOWEL PROBLEMS (unusual diarrhea, constipation, pain near the anus) TENDERNESS IN MOUTH AND THROAT WITH OR WITHOUT PRESENCE OF ULCERS (sore throat, sores in mouth, or a toothache) UNUSUAL RASH, SWELLING OR PAIN  UNUSUAL VAGINAL DISCHARGE OR ITCHING   Items with * indicate a potential emergency and should be followed up as soon as possible or go to the Emergency Department if any problems should occur.  Please show the CHEMOTHERAPY ALERT CARD or IMMUNOTHERAPY ALERT CARD at check-in to  the Emergency Department and triage nurse.  Should you have questions after your visit or need to cancel or reschedule your appointment, please contact [facility]  [phone] and follow the prompts.  Office hours are 8:00 a.m. to 4:30 p.m. Monday - Friday. Please note that voicemails left after 4:00 p.m. may not be returned until the following business day.  We are closed weekends and major holidays. You have access to a nurse at all times for urgent questions. Please call the main number to the clinic [phone] and follow the prompts.  For any non-urgent questions, you may also contact your provider using MyChart. We now offer e-Visits for anyone 13 and older to request care online for non-urgent symptoms. For details visit [email or web address].   Also download the MyChart app! Go to the app store, search "MyChart", open the app, select [facility], and log in with your MyChart username and password.  Durvalumab Injection What is this medication? DURVALUMAB (dur VAL ue mab) treats some types of cancer. It works by helping your immune system slow or stop the spread of cancer cells. It is a monoclonal antibody. This medicine may be used for other purposes; ask your health care provider or pharmacist if you have questions. COMMON BRAND NAME(S): IMFINZI What should I tell my care team before I take this medication? They need to know if you have any of these conditions: Allogeneic stem cell transplant (uses someone else's stem cells) Autoimmune diseases, such as Crohn disease, ulcerative colitis, lupus History of chest radiation Nervous system problems, such as Guillain-Barre syndrome, myasthenia gravis Organ  transplant An unusual or allergic reaction to durvalumab, other medications, foods, dyes, or preservatives Pregnant or trying to get pregnant Breast-feeding How should I use this medication? This medication is infused into a vein. It is given by  your care team in a hospital or clinic setting. A special MedGuide will be given to you before each treatment. Be sure to read this information carefully each time. Talk to your care team about the use of this medication in children. Special care may be needed. Overdosage: If you think you have taken too much of this medicine contact a poison control center or emergency room at once. NOTE: This medicine is only for you. Do not share this medicine with others. What if I miss a dose? Keep appointments for follow-up doses. It is important not to miss your dose. Call your care team if you are unable to keep an appointment. What may interact with this medication? Interactions have not been studied. This list may not describe all possible interactions. Give your health care provider a list of all the medicines, herbs, non-prescription drugs, or dietary supplements you use. Also tell them if you smoke, drink alcohol, or use illegal drugs. Some items may interact with your medicine. What should I watch for while using this medication? Your condition will be monitored carefully while you are receiving this medication. You may need blood work while taking this medication. This medication may cause serious skin reactions. They can happen weeks to months after starting the medication. Contact your care team right away if you notice fevers or flu-like symptoms with a rash. The rash may be red or purple and then turn into blisters or peeling of the skin. You may also notice a red rash with swelling of the face, lips, or lymph nodes in your neck or under your arms. Tell your care team right away if you have any change in your eyesight. Talk to your care team if you may be pregnant. Serious birth defects can occur if you take this medication during pregnancy and for 3 months after the last dose. You will need a negative pregnancy test before starting this medication. Contraception is recommended while taking this  medication and for 3 months after the last dose. Your care team can help you find the option that works for you. Do not breastfeed while taking this medication and for 3 months after the last dose. What side effects may I notice from receiving this medication? Side effects that you should report to your care team as soon as possible: Allergic reactions--skin rash, itching, hives, swelling of the face, lips, tongue, or throat Dry cough, shortness of breath or trouble breathing Eye pain, redness, irritation, or discharge with blurry or decreased vision Heart muscle inflammation--unusual weakness or fatigue, shortness of breath, chest pain, fast or irregular heartbeat, dizziness, swelling of the ankles, feet, or hands Hormone gland problems--headache, sensitivity to light, unusual weakness or fatigue, dizziness, fast or irregular heartbeat, increased sensitivity to cold or heat, excessive sweating, constipation, hair loss, increased thirst or amount of urine, tremors or shaking, irritability Infusion reactions--chest pain, shortness of breath or trouble breathing, feeling faint or lightheaded Kidney injury (glomerulonephritis)--decrease in the amount of urine, red or dark brown urine, foamy or bubbly urine, swelling of the ankles, hands, or feet Liver injury--right upper belly pain, loss of appetite, nausea, light-colored stool, dark yellow or brown urine, yellowing skin or eyes, unusual weakness or fatigue Pain, tingling, or numbness in the hands or feet, muscle weakness, change  in vision, confusion or trouble speaking, loss of balance or coordination, trouble walking, seizures Rash, fever, and swollen lymph nodes Redness, blistering, peeling, or loosening of the skin, including inside the mouth Sudden or severe stomach pain, bloody diarrhea, fever, nausea, vomiting Side effects that usually do not require medical attention (report these to your care team if they continue or are bothersome): Bone,  joint, or muscle pain Diarrhea Fatigue Loss of appetite Nausea Skin rash This list may not describe all possible side effects. Call your doctor for medical advice about side effects. You may report side effects to FDA at 1-800-FDA-1088. Where should I keep my medication? This medication is given in a hospital or clinic. It will not be stored at home. NOTE: This sheet is a summary. It may not cover all possible information. If you have questions about this medicine, talk to your doctor, pharmacist, or health care provider.  2023 Elsevier/Gold Standard (2021-12-13 00:00:00)

## 2022-10-24 NOTE — Progress Notes (Unsigned)
Patient states that he has a sore spot on right leg he would like the provider to take a look at.Patient also has cough that is lingering.

## 2022-10-25 LAB — T4: T4, Total: 7.9 ug/dL (ref 4.5–12.0)

## 2022-10-26 ENCOUNTER — Encounter: Payer: Self-pay | Admitting: Oncology

## 2022-10-26 NOTE — Progress Notes (Signed)
Hematology/Oncology Consult note [facility]  [phone] [phone]  Patient Care Team: [name], MD as PCP - General (Family Medicine) Hollice Espy, MD as Consulting Physician (Urology) Corey Skains, MD as Consulting Physician (Cardiology) Leandrew Koyanagi, MD as Referring Physician (Ophthalmology) Ralene Bathe, MD (Dermatology) Telford Nab, RN as Oncology Nurse Navigator Sindy Guadeloupe, MD as Consulting Physician (Oncology) Tyler Pita, MD as Consulting Physician (Pulmonary Disease)   Name of the [name]  [id number]  [birth year]   Date of visit: 10/26/22  Diagnosis-  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  He was found to have a left upper lobe lung mass measuring 5.5 x 3.7 x 2.8 cm.  It was confluent with the left hilar lymph node.  Enlarged AP window lymph node measuring 16 mm.  Enlarged left paratracheal lymph node.  Low-attenuation 7 mm lesion in the liver.  7 mm cyst in the uncinate process of the pancreas for which follow-up was not recommended.  Bilateral kidney cysts.  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  Prostatic enlargement.   PET CT is can showed hypermetabolic left upper lobe lung mass measuring 5.4 x 3.9 cm with an SUV of 15.1.  Ipsilateral mediastinal lymph node metastases with index lymph node measuring 1.7 with an SUV of 9.8.  No evidence of distant metastatic disease.  Biopsy of the left upper lobe as well as station 4R lymph node was consistent with squamous cell carcinoma   NGS testing did not show any actionable mutations.  PD-L1 35%.  MSI stable.   MRI brain was negative for metastatic disease   Patient completed concurrent chemoradiation with weekly CarboTaxol chemotherapy in December 2023.  Scans following that showed partial response to treatment.  Patient started adjuvant durvalumab in January 2024      Interval history-patient is tolerating durvalumab well without any significant side effects.  Denies any specific complaints today.  ECOG PS- 1 Pain scale- 0   Review of systems- Review of Systems  Constitutional:  Negative for chills, fever, malaise/fatigue and weight loss.  HENT:  Negative for congestion, ear discharge and nosebleeds.   Eyes:  Negative for blurred vision.  Respiratory:  Negative for cough, hemoptysis, sputum production, shortness of breath and wheezing.   Cardiovascular:  Negative for chest pain, palpitations, orthopnea and claudication.  Gastrointestinal:  Negative for abdominal pain, blood in stool, constipation, diarrhea, heartburn, melena, nausea and vomiting.  Genitourinary:  Negative for dysuria, flank pain, frequency, hematuria and urgency.  Musculoskeletal:  Negative for back pain, joint pain and myalgias.  Skin:  Negative for rash.  Neurological:  Negative for dizziness, tingling, focal weakness, seizures, weakness and headaches.  Endo/Heme/Allergies:  Does not bruise/bleed easily.  Psychiatric/Behavioral:  Negative for depression and suicidal ideas. The patient does not have insomnia.       Allergies  Allergen Reactions   Cefdinir Nausea Only    hypersensitivity to smell   Doxycycline     Sun sensitivity   Prednisone     Hallucinations    Sertraline Other (See Comments)    Hallucinations      Past Medical History:  Diagnosis Date  A-fib ([place])    a.) CHA2DS2-VASc = 6 (age x2, HTN, DVT x2, vascular disease history). b.) rate/rhythm maintained on oral carvedilol; chronically anticoagulated using daily warfarin.   Actinic keratosis    Anemia    Aortic atherosclerosis (HCC)     Bilateral renal cysts    a.)  CT abdomen pelvis 04/24/2022: Enhancing septation of the lateral aspect of the mid RIGHT kidney measuring 2.7 x 2.8 cm concerning for cystic renal neoplasm   Bladder calculi    BPH (benign prostatic hyperplasia)    DDD (degenerative disc disease), lumbar    a.) s/p LEFT laminectomy L5   Diverticulosis    DOE (dyspnea on exertion)    DVT (deep venous thrombosis) (HCC)    Elevated PSA    GERD (gastroesophageal reflux disease)    H/O degenerative disc disease    HLD (hyperlipidemia)    Hypertension    Lipoma of arm    Lipomatosis    Long term current use of anticoagulant    a.) warfarin   Lung cancer ([place])    Mass of upper lobe of left lung 04/24/2022   a.)  CT chest 04/24/2022: Multilobulated perihilar mass measuring 5.3 x 3.7 x 2.8 cm with associated LEFT hilar and mediastinal LAD; b.)  PET CT 99991111: Hypermetabolic LEFT upper lobe lung mass (max SUV 15.1); ipsilateral nodal metastasis --> presuming a NSC histology, 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   NICM (nonischemic cardiomyopathy) ([place]) 11/22/2015   a.) TTE 11/22/2015: EF 40%. b.) TTE 12/27/2018: EF 45%. c.) TTE 08/30/2020: EF 45-50%.   Night sweats    Obesity    Osteoarthritis    RBBB (right bundle branch block)    Right inguinal hernia    VHD (valvular heart disease) 11/22/2015   a.) TTE 11/22/2015: EF 40%; mod MR, mod-sev TR, triv PR; mod BAE, mild RV enlargement. b.) TTE 12/27/2018: EF 45%; mod MR, mod-sev TR, triv PR; mod BAE, mild RV enlargement. c.) TTE 08/30/2020: EF 45-50%; mild MR/TR.     Past Surgical History:  Procedure Laterality Date   Bilateral Radical Keratotomy Bilateral 1997   COLONOSCOPY WITH PROPOFOL N/A 01/11/2016   Procedure: COLONOSCOPY WITH PROPOFOL;  Surgeon: Manya Silvas, MD;  Location: [facility] ENDOSCOPY;  Service: Endoscopy;  Laterality: N/A; repeat 3 years, tubular adenoma   DENTAL SURGERY     Patient had implants    FLEXIBLE BRONCHOSCOPY Left 05/19/2022   Procedure: FLEXIBLE BRONCHOSCOPY;  Surgeon: Tyler Pita, MD;  Location: [facility] ORS;  Service: Cardiopulmonary;  Laterality: Left;   GUM SURGERY     IR IMAGING GUIDED PORT INSERTION  06/13/2022   L4 - S1 decompression Left    PROSTATE BIOPSY     TONSILLECTOMY AND ADENOIDECTOMY  age 67   VIDEO BRONCHOSCOPY WITH ENDOBRONCHIAL ULTRASOUND Left 05/19/2022   Procedure: VIDEO BRONCHOSCOPY WITH ENDOBRONCHIAL ULTRASOUND;  Surgeon: Tyler Pita, MD;  Location: [facility] ORS;  Service: Cardiopulmonary;  Laterality: Left;   XI ROBOTIC ASSISTED INGUINAL HERNIA REPAIR WITH MESH Right 10/21/2021   Procedure: XI ROBOTIC ASSISTED INGUINAL HERNIA REPAIR WITH MESH;  Surgeon: Ronny Bacon, MD;  Location: [facility] ORS;  Service: General;  Laterality: Right;    Social History   Socioeconomic History   Marital status: Married    Spouse name: Doris   Number of children: 3   Years of education: Not on file   Highest education level: Associate degree: occupational, Hotel manager, or vocational program  Occupational History  Occupation: retired  Tobacco Use   Smoking status: Former    Types: Cigarettes    Quit date: 08/26/1971    Years since quitting: 51.2    Passive exposure: Never   Smokeless tobacco: Never   Tobacco comments:    Smoked for about 20 years.  Vaping Use   Vaping Use: Never used  Substance and Sexual Activity   Alcohol use: Not Currently   Drug use: No   Sexual activity: Not Currently  Other Topics Concern   Not on file  Social History Narrative   Not on file   Social Determinants of Health   Financial Resource Strain: Low Risk  (12/18/2021)   Overall Financial Resource Strain (CARDIA)    Difficulty of Paying Living Expenses: Not hard at all  Food Insecurity: No Food Insecurity (12/18/2021)   Hunger Vital Sign    Worried About Running Out of Food in the Last Year: Never true    Ran Out of Food in the Last Year: Never true  Transportation  Needs: No Transportation Needs (12/18/2021)   PRAPARE - Hydrologist (Medical): No    Lack of Transportation (Non-Medical): No  Physical Activity: Sufficiently Active (12/18/2021)   Exercise Vital Sign    Days of Exercise per Week: 7 days    Minutes of Exercise per Session: 50 min  Stress: No Stress Concern Present (12/18/2021)   [place]    Feeling of Stress : Only a little  Social Connections: Moderately Integrated (12/18/2021)   Social Connection and Isolation Panel [NHANES]    Frequency of Communication with Friends and Family: Twice a week    Frequency of Social Gatherings with Friends and Family: Once a week    Attends Religious Services: More than 4 times per year    Active Member of [employer] or Organizations: No    Attends Archivist Meetings: Never    Marital Status: Married  Human resources officer Violence: Not At Risk (12/18/2021)   Humiliation, Afraid, Rape, and Kick questionnaire    Fear of Current or Ex-Partner: No    Emotionally Abused: No    Physically Abused: No    Sexually Abused: No    Family History  Problem Relation Age of Onset   Cancer Mother        secondary to cancinoma of the jaw from her dipping snuff.   Heart attack Father    CAD Father    Hypertension Sister    Diabetes Son        Type I diabetes   Prostate cancer Neg Hx    Kidney cancer Neg Hx      Current Outpatient Medications:    amLODipine (NORVASC) 5 MG tablet, TAKE 1 TABLET (5 MG TOTAL) BY MOUTH DAILY., Disp: 90 tablet, Rfl: 3   benazepril (LOTENSIN) 40 MG tablet, TAKE 1 TABLET EVERY DAY, Disp: 90 tablet, Rfl: 3   carvedilol (COREG) 6.25 MG tablet, TAKE 1 TABLET (6.25 MG TOTAL) BY MOUTH 2 (TWO) TIMES DAILY., Disp: 180 tablet, Rfl: 3   Cyanocobalamin (VITAMIN B 12 PO), Take 1 capsule by mouth 2 (two) times daily. 1000 mcg each capsule, Disp: , Rfl:    doxazosin (CARDURA) 8 MG tablet, TAKE 1  TABLET (8 MG TOTAL) BY MOUTH DAILY., Disp: 90 tablet, Rfl: 3   finasteride (PROSCAR) 5 MG tablet, Take 1 tablet (5 mg total) by mouth daily., Disp: 90 tablet, Rfl: 3   gabapentin (NEURONTIN)  300 MG capsule, TAKE 1 CAPSULE EVERY MORNING, 1 CAPSULE IN THE AFTERNOON AND 2 CAPSULES AT BEDTIME, Disp: 360 capsule, Rfl: 2   Magnesium 500 MG TABS, Take 500 mg by mouth daily., Disp: , Rfl:    Multiple Vitamin (MULTI-VITAMIN) tablet, Take 1 tablet by mouth daily. , Disp: , Rfl:    omeprazole (PRILOSEC) 20 MG capsule, Take 20 mg by mouth daily., Disp: , Rfl:    oxyCODONE (OXY IR/ROXICODONE) 5 MG immediate release tablet, Take by mouth., Disp: , Rfl:    triamterene-hydrochlorothiazide (MAXZIDE-25) 37.5-25 MG tablet, TAKE 1/2 TABLET EVERY DAY, Disp: 45 tablet, Rfl: 1   warfarin (COUMADIN) 4 MG tablet, Take 1 tablet (4 mg total) by mouth daily. (Patient taking differently: Take 4 mg by mouth daily. Pt states is currently taking 5 mg.), Disp: 90 tablet, Rfl: 0 No current facility-administered medications for this visit.  Facility-Administered Medications Ordered in Other Visits:    heparin lock flush 100 UNIT/ML injection, , , ,   Physical exam:  Vitals:   10/24/22 1314  BP: (!) 158/88  Pulse: 68  Resp: 18  Temp: (!) 95.8 F (35.4 C)  TempSrc: Tympanic  SpO2: 100%  Weight: 163 lb 12.8 oz (74.3 kg)  Height: '5\' 8"'$  (1.727 m)   Physical Exam Cardiovascular:     Rate and Rhythm: Normal rate and regular rhythm.     Heart sounds: Normal heart sounds.  Pulmonary:     Effort: Pulmonary effort is normal.     Breath sounds: Normal breath sounds.  Abdominal:     General: Bowel sounds are normal.     Palpations: Abdomen is soft.  Skin:    General: Skin is warm and dry.  Neurological:     Mental Status: He is alert and oriented to person, place, and time.         Latest Ref Rng & Units 10/24/2022    1:04 PM  CMP  Glucose 70 - 99 mg/dL 121   BUN 8 - 23 mg/dL 22   Creatinine 0.61 - 1.24 mg/dL 1.05    Sodium 135 - 145 mmol/L 135   Potassium 3.5 - 5.1 mmol/L 3.9   Chloride 98 - 111 mmol/L 106   CO2 22 - 32 mmol/L 23   Calcium 8.9 - 10.3 mg/dL 8.7   Total Protein 6.5 - 8.1 g/dL 6.3   Total Bilirubin 0.3 - 1.2 mg/dL 1.0   Alkaline Phos 38 - 126 U/L 64   AST 15 - 41 U/L 21   ALT 0 - 44 U/L 14       Latest Ref Rng & Units 10/24/2022    1:04 PM  CBC  WBC 4.0 - 10.5 K/uL 4.1   Hemoglobin 13.0 - 17.0 g/dL 10.6   Hematocrit 39.0 - 52.0 % 31.5   Platelets 150 - 400 K/uL 122     No images are attached to the encounter.  No results found.   Assessment and plan- Patient is a 88 y.o. male with history of stage III squamous cell carcinoma of the left upper lobe T3 N2 M0 s/p concurrent weekly CarboTaxol with radiation.  He is here for on treatment assessment prior to cycle 3 of adjuvant durvalumab  Counts okay to proceed with cycle 3 of adjuvant durvalumab today.  I will see him back in 4 weeks for cycle 4.  Plan to repeat scans CT chest abdomen and pelvis with contrast in 5 weeks time.  He is tolerating treatments well without any  significant side effects   Visit Diagnosis 1. Primary malignant neoplasm of left upper lobe of lung ([place])   2. Encounter for antineoplastic immunotherapy      Dr. Randa Evens, MD, MPH [facility] [id number] 10/26/2022 9:33 AM

## 2022-10-29 ENCOUNTER — Encounter: Payer: Self-pay | Admitting: Radiation Oncology

## 2022-11-03 ENCOUNTER — Other Ambulatory Visit: Payer: Self-pay | Admitting: Family Medicine

## 2022-11-19 ENCOUNTER — Other Ambulatory Visit: Payer: Self-pay

## 2022-11-19 ENCOUNTER — Ambulatory Visit (INDEPENDENT_AMBULATORY_CARE_PROVIDER_SITE_OTHER): Payer: Medicare HMO

## 2022-11-19 ENCOUNTER — Ambulatory Visit
Admission: RE | Admit: 2022-11-19 | Discharge: 2022-11-19 | Disposition: A | Discharge status: Home / Self Care | Payer: Medicare HMO | Source: Ambulatory Visit | Attending: Radiation Oncology | Admitting: Radiation Oncology

## 2022-11-19 DIAGNOSIS — C3412 Malignant neoplasm of upper lobe, left bronchus or lung: Secondary | ICD-10-CM | POA: Diagnosis not present

## 2022-11-19 DIAGNOSIS — C3490 Malignant neoplasm of unspecified part of unspecified bronchus or lung: Secondary | ICD-10-CM | POA: Diagnosis not present

## 2022-11-19 DIAGNOSIS — J90 Pleural effusion, not elsewhere classified: Secondary | ICD-10-CM | POA: Diagnosis not present

## 2022-11-19 DIAGNOSIS — N2889 Other specified disorders of kidney and ureter: Secondary | ICD-10-CM | POA: Diagnosis not present

## 2022-11-19 DIAGNOSIS — J432 Centrilobular emphysema: Secondary | ICD-10-CM | POA: Diagnosis not present

## 2022-11-19 DIAGNOSIS — I480 Paroxysmal atrial fibrillation: Secondary | ICD-10-CM | POA: Diagnosis not present

## 2022-11-19 LAB — POCT INR
INR: 3.7 — AB (ref 2.0–3.0)
PT: 44.8

## 2022-11-19 MED ORDER — IOHEXOL 300 MG/ML  SOLN
80.0000 mL | Freq: Once | INTRAMUSCULAR | Status: AC | PRN
  Administered 2022-11-19: 80 mL via INTRAVENOUS

## 2022-11-19 MED ORDER — HEPARIN SOD (PORK) LOCK FLUSH 100 UNIT/ML IV SOLN
INTRAVENOUS | Status: AC
  Filled 2022-11-19: qty 5

## 2022-11-19 MED ORDER — HEPARIN SOD (PORK) LOCK FLUSH 100 UNIT/ML IV SOLN
500.0000 [IU] | Freq: Once | INTRAVENOUS | Status: AC
  Administered 2022-11-19: 500 [IU] via INTRAVENOUS
  Filled 2022-11-19: qty 5

## 2022-11-19 NOTE — Patient Instructions (Signed)
Description   Hold 2 days and resume 5mg daily. Return in 2 weeks.       

## 2022-11-19 NOTE — Telephone Encounter (Signed)
Patient came in for INR checked and requested a refill on the oxycodone 5mg .  [facility]: [facility]

## 2022-11-21 ENCOUNTER — Inpatient Hospital Stay: Payer: Medicare HMO

## 2022-11-21 ENCOUNTER — Inpatient Hospital Stay (HOSPITAL_BASED_OUTPATIENT_CLINIC_OR_DEPARTMENT_OTHER): Payer: Medicare HMO | Admitting: Oncology

## 2022-11-21 ENCOUNTER — Encounter: Payer: Self-pay | Admitting: Oncology

## 2022-11-21 VITALS — BP 138/81 | HR 60 | Temp 97.9°F | Resp 18 | Wt 161.1 lb

## 2022-11-21 DIAGNOSIS — Z7189 Other specified counseling: Secondary | ICD-10-CM

## 2022-11-21 DIAGNOSIS — C3412 Malignant neoplasm of upper lobe, left bronchus or lung: Secondary | ICD-10-CM

## 2022-11-21 DIAGNOSIS — D649 Anemia, unspecified: Secondary | ICD-10-CM | POA: Diagnosis not present

## 2022-11-21 DIAGNOSIS — C771 Secondary and unspecified malignant neoplasm of intrathoracic lymph nodes: Secondary | ICD-10-CM | POA: Diagnosis not present

## 2022-11-21 DIAGNOSIS — Z79620 Long term (current) use of immunosuppressive biologic: Secondary | ICD-10-CM | POA: Diagnosis not present

## 2022-11-21 DIAGNOSIS — Z5112 Encounter for antineoplastic immunotherapy: Secondary | ICD-10-CM

## 2022-11-21 LAB — CBC WITH DIFFERENTIAL/PLATELET
Abs Immature Granulocytes: 0.01 10*3/uL (ref 0.00–0.07)
Basophils Absolute: 0 10*3/uL (ref 0.0–0.1)
Basophils Relative: 1 %
Eosinophils Absolute: 0.2 10*3/uL (ref 0.0–0.5)
Eosinophils Relative: 4 %
HCT: 31.9 % — ABNORMAL LOW (ref 39.0–52.0)
Hemoglobin: 10.8 g/dL — ABNORMAL LOW (ref 13.0–17.0)
Immature Granulocytes: 0 %
Lymphocytes Relative: 11 %
Lymphs Abs: 0.5 10*3/uL — ABNORMAL LOW (ref 0.7–4.0)
MCH: 34.2 pg — ABNORMAL HIGH (ref 26.0–34.0)
MCHC: 33.9 g/dL (ref 30.0–36.0)
MCV: 100.9 fL — ABNORMAL HIGH (ref 80.0–100.0)
Monocytes Absolute: 0.6 10*3/uL (ref 0.1–1.0)
Monocytes Relative: 13 %
Neutro Abs: 3.3 10*3/uL (ref 1.7–7.7)
Neutrophils Relative %: 71 %
Platelets: 120 10*3/uL — ABNORMAL LOW (ref 150–400)
RBC: 3.16 MIL/uL — ABNORMAL LOW (ref 4.22–5.81)
RDW: 13.6 % (ref 11.5–15.5)
WBC: 4.7 10*3/uL (ref 4.0–10.5)
nRBC: 0 % (ref 0.0–0.2)

## 2022-11-21 LAB — COMPREHENSIVE METABOLIC PANEL
ALT: 14 U/L (ref 0–44)
AST: 24 U/L (ref 15–41)
Albumin: 3.5 g/dL (ref 3.5–5.0)
Alkaline Phosphatase: 63 U/L (ref 38–126)
Anion gap: 5 (ref 5–15)
BUN: 20 mg/dL (ref 8–23)
CO2: 23 mmol/L (ref 22–32)
Calcium: 8.4 mg/dL — ABNORMAL LOW (ref 8.9–10.3)
Chloride: 106 mmol/L (ref 98–111)
Creatinine, Ser: 1.07 mg/dL (ref 0.61–1.24)
GFR, Estimated: >60 mL/min (ref >60)
Glucose, Bld: 104 mg/dL — ABNORMAL HIGH (ref 70–99)
Potassium: 3.6 mmol/L (ref 3.5–5.1)
Sodium: 134 mmol/L — ABNORMAL LOW (ref 135–145)
Total Bilirubin: 0.9 mg/dL (ref 0.3–1.2)
Total Protein: 6.1 g/dL — ABNORMAL LOW (ref 6.5–8.1)

## 2022-11-21 MED ORDER — SODIUM CHLORIDE 0.9 % IV SOLN
Freq: Once | INTRAVENOUS | Status: AC
  Filled 2022-11-21: qty 250

## 2022-11-21 MED ORDER — HEPARIN SOD (PORK) LOCK FLUSH 100 UNIT/ML IV SOLN
500.0000 [IU] | Freq: Once | INTRAVENOUS | Status: AC | PRN
  Administered 2022-11-21: 500 [IU]
  Filled 2022-11-21: qty 5

## 2022-11-21 MED ORDER — SODIUM CHLORIDE 0.9 % IV SOLN
1500.0000 mg | Freq: Once | INTRAVENOUS | Status: AC
  Administered 2022-11-21: 1500 mg via INTRAVENOUS
  Filled 2022-11-21: qty 30

## 2022-11-21 NOTE — Progress Notes (Signed)
Patient states that he would like for his wife and daughter  to be present to go over results.

## 2022-11-21 NOTE — Progress Notes (Signed)
Hematology/Oncology Consult note [facility]  [phone] [phone]  Patient Care Team: [name], MD as PCP - General (Family Medicine) Hollice Espy, MD as Consulting Physician (Urology) Corey Skains, MD as Consulting Physician (Cardiology) Leandrew Koyanagi, MD as Referring Physician (Ophthalmology) Ralene Bathe, MD (Dermatology) Telford Nab, RN as Oncology Nurse Navigator Sindy Guadeloupe, MD as Consulting Physician (Oncology) Tyler Pita, MD as Consulting Physician (Pulmonary Disease)   Name of the [name]  [id number]  10/06/1934   Date of visit: 11/21/22  Diagnosis- 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  He was found to have a left upper lobe lung mass measuring 5.5 x 3.7 x 2.8 cm.  It was confluent with the left hilar lymph node.  Enlarged AP window lymph node measuring 16 mm.  Enlarged left paratracheal lymph node.  Low-attenuation 7 mm lesion in the liver.  7 mm cyst in the uncinate process of the pancreas for which follow-up was not recommended.  Bilateral kidney cysts.  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  Prostatic enlargement.   PET CT is can showed hypermetabolic left upper lobe lung mass measuring 5.4 x 3.9 cm with an SUV of 15.1.  Ipsilateral mediastinal lymph node metastases with index lymph node measuring 1.7 with an SUV of 9.8.  No evidence of distant metastatic disease.  Biopsy of the left upper lobe as well as station 4R lymph node was consistent with squamous cell carcinoma   NGS testing did  not show any actionable mutations.  PD-L1 35%.  MSI stable.  MRI brain was negative for metastatic disease   Patient completed concurrent chemoradiation with weekly CarboTaxol chemotherapy in December 2023.  Scans following that showed partial response to treatment.  Patient started adjuvant durvalumab in January 2024     Interval history-tolerating treatments well so far.  He reports pruritus mainly over his back but denies other complaints  ECOG PS- 1 Pain scale- 0   Review of systems- Review of Systems  Constitutional:  Negative for chills, fever, malaise/fatigue and weight loss.  HENT:  Negative for congestion, ear discharge and nosebleeds.   Eyes:  Negative for blurred vision.  Respiratory:  Negative for cough, hemoptysis, sputum production, shortness of breath and wheezing.   Cardiovascular:  Negative for chest pain, palpitations, orthopnea and claudication.  Gastrointestinal:  Negative for abdominal pain, blood in stool, constipation, diarrhea, heartburn, melena, nausea and vomiting.  Genitourinary:  Negative for dysuria, flank pain, frequency, hematuria and urgency.  Musculoskeletal:  Negative for back pain, joint pain and myalgias.  Skin:  Negative for rash.  Neurological:  Negative for dizziness, tingling, focal weakness, seizures, weakness and headaches.  Endo/Heme/Allergies:  Does not bruise/bleed easily.  Psychiatric/Behavioral:  Negative for depression and suicidal ideas. The patient does not have insomnia.       Allergies  Allergen Reactions   Cefdinir Nausea Only    hypersensitivity to smell   Doxycycline     Sun sensitivity   Prednisone     Hallucinations    Sertraline Other (See Comments)    Hallucinations  Past Medical History:  Diagnosis Date   A-fib [facility])    a.) CHA2DS2-VASc = 6 (age x2, HTN, DVT x2, vascular disease history). b.) rate/rhythm maintained on oral carvedilol; chronically anticoagulated using daily warfarin.   Actinic keratosis     Anemia    Aortic atherosclerosis (HCC)    Bilateral renal cysts    a.)  CT abdomen pelvis 04/24/2022: Enhancing septation of the lateral aspect of the mid RIGHT kidney measuring 2.7 x 2.8 cm concerning for cystic renal neoplasm   Bladder calculi    BPH (benign prostatic hyperplasia)    DDD (degenerative disc disease), lumbar    a.) s/p LEFT laminectomy L5   Diverticulosis    DOE (dyspnea on exertion)    DVT (deep venous thrombosis) (HCC)    Elevated PSA    GERD (gastroesophageal reflux disease)    H/O degenerative disc disease    HLD (hyperlipidemia)    Hypertension    Lipoma of arm    Lipomatosis    Long term current use of anticoagulant    a.) warfarin   Lung cancer ([place])    Mass of upper lobe of left lung 04/24/2022   a.)  CT chest 04/24/2022: Multilobulated perihilar mass measuring 5.3 x 3.7 x 2.8 cm with associated LEFT hilar and mediastinal LAD; b.)  PET CT 99991111: Hypermetabolic LEFT upper lobe lung mass (max SUV 15.1); ipsilateral nodal metastasis --> presuming a NSC histology, 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   NICM (nonischemic cardiomyopathy) ([place]) 11/22/2015   a.) TTE 11/22/2015: EF 40%. b.) TTE 12/27/2018: EF 45%. c.) TTE 08/30/2020: EF 45-50%.   Night sweats    Obesity    Osteoarthritis    RBBB (right bundle branch block)    Right inguinal hernia    VHD (valvular heart disease) 11/22/2015   a.) TTE 11/22/2015: EF 40%; mod MR, mod-sev TR, triv PR; mod BAE, mild RV enlargement. b.) TTE 12/27/2018: EF 45%; mod MR, mod-sev TR, triv PR; mod BAE, mild RV enlargement. c.) TTE 08/30/2020: EF 45-50%; mild MR/TR.     Past Surgical History:  Procedure Laterality Date   Bilateral Radical Keratotomy Bilateral 1997   COLONOSCOPY WITH PROPOFOL N/A 01/11/2016   Procedure: COLONOSCOPY WITH PROPOFOL;  Surgeon: Manya Silvas, MD;  Location: [facility] ENDOSCOPY;  Service: Endoscopy;  Laterality: N/A; repeat 3 years, tubular adenoma    DENTAL SURGERY     Patient had implants   FLEXIBLE BRONCHOSCOPY Left 05/19/2022   Procedure: FLEXIBLE BRONCHOSCOPY;  Surgeon: Tyler Pita, MD;  Location: [facility] ORS;  Service: Cardiopulmonary;  Laterality: Left;   GUM SURGERY     IR IMAGING GUIDED PORT INSERTION  06/13/2022   L4 - S1 decompression Left    PROSTATE BIOPSY     TONSILLECTOMY AND ADENOIDECTOMY  age 41   VIDEO BRONCHOSCOPY WITH ENDOBRONCHIAL ULTRASOUND Left 05/19/2022   Procedure: VIDEO BRONCHOSCOPY WITH ENDOBRONCHIAL ULTRASOUND;  Surgeon: Tyler Pita, MD;  Location: [facility] ORS;  Service: Cardiopulmonary;  Laterality: Left;   XI ROBOTIC ASSISTED INGUINAL HERNIA REPAIR WITH MESH Right 10/21/2021   Procedure: XI ROBOTIC ASSISTED INGUINAL HERNIA REPAIR WITH MESH;  Surgeon: Ronny Bacon, MD;  Location: [facility] ORS;  Service: General;  Laterality: Right;    Social History   Socioeconomic History   Marital status: Married    Spouse name: Doris   Number of children: 3   Years of education: Not on file   Highest education level: Associate degree: occupational, Hotel manager,  or vocational program  Occupational History   Occupation: retired  Tobacco Use   Smoking status: Former    Types: Cigarettes    Quit date: 08/26/1971    Years since quitting: 51.2    Passive exposure: Never   Smokeless tobacco: Never   Tobacco comments:    Smoked for about 20 years.  Vaping Use   Vaping Use: Never used  Substance and Sexual Activity   Alcohol use: Not Currently   Drug use: No   Sexual activity: Not Currently  Other Topics Concern   Not on file  Social History Narrative   Not on file   Social Determinants of Health   Financial Resource Strain: Low Risk  (12/18/2021)   Overall Financial Resource Strain (CARDIA)    Difficulty of Paying Living Expenses: Not hard at all  Food Insecurity: No Food Insecurity (12/18/2021)   Hunger Vital Sign    Worried About Running Out of Food in the Last Year: Never true    Ran Out of Food in  the Last Year: Never true  Transportation Needs: No Transportation Needs (12/18/2021)   PRAPARE - Hydrologist (Medical): No    Lack of Transportation (Non-Medical): No  Physical Activity: Sufficiently Active (12/18/2021)   Exercise Vital Sign    Days of Exercise per Week: 7 days    Minutes of Exercise per Session: 50 min  Stress: No Stress Concern Present (12/18/2021)   [place]    Feeling of Stress : Only a little  Social Connections: Moderately Integrated (12/18/2021)   Social Connection and Isolation Panel [NHANES]    Frequency of Communication with Friends and Family: Twice a week    Frequency of Social Gatherings with Friends and Family: Once a week    Attends Religious Services: More than 4 times per year    Active Member of [employer] or Organizations: No    Attends Archivist Meetings: Never    Marital Status: Married  Human resources officer Violence: Not At Risk (12/18/2021)   Humiliation, Afraid, Rape, and Kick questionnaire    Fear of Current or Ex-Partner: No    Emotionally Abused: No    Physically Abused: No    Sexually Abused: No    Family History  Problem Relation Age of Onset   Cancer Mother        secondary to cancinoma of the jaw from her dipping snuff.   Heart attack Father    CAD Father    Hypertension Sister    Diabetes Son        Type I diabetes   Prostate cancer Neg Hx    Kidney cancer Neg Hx      Current Outpatient Medications:    amLODipine (NORVASC) 5 MG tablet, TAKE 1 TABLET (5 MG TOTAL) BY MOUTH DAILY., Disp: 90 tablet, Rfl: 3   benazepril (LOTENSIN) 40 MG tablet, TAKE 1 TABLET EVERY DAY, Disp: 90 tablet, Rfl: 3   carvedilol (COREG) 6.25 MG tablet, TAKE 1 TABLET (6.25 MG TOTAL) BY MOUTH 2 (TWO) TIMES DAILY., Disp: 180 tablet, Rfl: 3   Cyanocobalamin (VITAMIN B 12 PO), Take 1 capsule by mouth 2 (two) times daily. 1000 mcg each capsule, Disp: , Rfl:     doxazosin (CARDURA) 8 MG tablet, TAKE 1 TABLET (8 MG TOTAL) BY MOUTH DAILY., Disp: 90 tablet, Rfl: 3   finasteride (PROSCAR) 5 MG tablet, Take 1 tablet (5 mg total) by mouth daily., Disp:  90 tablet, Rfl: 3   gabapentin (NEURONTIN) 300 MG capsule, TAKE 1 CAPSULE EVERY MORNING, 1 CAPSULE IN THE AFTERNOON AND 2 CAPSULES AT BEDTIME, Disp: 360 capsule, Rfl: 2   Magnesium 500 MG TABS, Take 500 mg by mouth daily., Disp: , Rfl:    Multiple Vitamin (MULTI-VITAMIN) tablet, Take 1 tablet by mouth daily. , Disp: , Rfl:    omeprazole (PRILOSEC) 20 MG capsule, Take 20 mg by mouth daily., Disp: , Rfl:    oxyCODONE (OXY IR/ROXICODONE) 5 MG immediate release tablet, Take by mouth., Disp: , Rfl:    triamterene-hydrochlorothiazide (MAXZIDE-25) 37.5-25 MG tablet, TAKE 1/2 TABLET EVERY DAY, Disp: 45 tablet, Rfl: 1   warfarin (COUMADIN) 5 MG tablet, Take 1 tablet (5 mg total) by mouth daily., Disp: 90 tablet, Rfl: 1 No current facility-administered medications for this visit.  Facility-Administered Medications Ordered in Other Visits:    heparin lock flush 100 UNIT/ML injection, , , ,   Physical exam:  Vitals:   11/21/22 0918 11/21/22 0924  BP: (!) 149/68 138/81  Pulse: 69 60  Resp: 18   Temp: 97.9 F (36.6 C)   TempSrc: Tympanic   SpO2: 96%   Weight: 161 lb 1.6 oz (73.1 kg)    Physical Exam Cardiovascular:     Rate and Rhythm: Normal rate and regular rhythm.     Heart sounds: Normal heart sounds.  Pulmonary:     Effort: Pulmonary effort is normal.     Breath sounds: Normal breath sounds.  Abdominal:     General: Bowel sounds are normal.     Palpations: Abdomen is soft.  Skin:    General: Skin is warm and dry.  Neurological:     Mental Status: He is alert and oriented to person, place, and time.         Latest Ref Rng & Units 11/21/2022    9:10 AM  CMP  Glucose 70 - 99 mg/dL 104   BUN 8 - 23 mg/dL 20   Creatinine 0.61 - 1.24 mg/dL 1.07   Sodium 135 - 145 mmol/L 134   Potassium 3.5 - 5.1  mmol/L 3.6   Chloride 98 - 111 mmol/L 106   CO2 22 - 32 mmol/L 23   Calcium 8.9 - 10.3 mg/dL 8.4   Total Protein 6.5 - 8.1 g/dL 6.1   Total Bilirubin 0.3 - 1.2 mg/dL 0.9   Alkaline Phos 38 - 126 U/L 63   AST 15 - 41 U/L 24   ALT 0 - 44 U/L 14       Latest Ref Rng & Units 11/21/2022    9:10 AM  CBC  WBC 4.0 - 10.5 K/uL 4.7   Hemoglobin 13.0 - 17.0 g/dL 10.8   Hematocrit 39.0 - 52.0 % 31.9   Platelets 150 - 400 K/uL 120     No images are attached to the encounter.  CT CHEST ABDOMEN PELVIS W CONTRAST  Result Date: 11/20/2022 CLINICAL DATA:  Lung cancer, ongoing chemotherapy, radiation * Tracking Code: BO * EXAM: CT CHEST, ABDOMEN, AND PELVIS WITH CONTRAST TECHNIQUE: Multidetector CT imaging of the chest, abdomen and pelvis was performed following the standard protocol during bolus administration of intravenous contrast. RADIATION DOSE REDUCTION: This exam was performed according to the departmental dose-optimization program which includes automated exposure control, adjustment of the mA and/or kV according to patient size and/or use of iterative reconstruction technique. CONTRAST:  37mL OMNIPAQUE IOHEXOL 300 MG/ML  SOLN COMPARISON:  CT chest abdomen pelvis, 08/15/2022 FINDINGS: CT  CHEST FINDINGS Cardiovascular: Right chest port catheter. Aortic atherosclerosis. Cardiomegaly. Three-vessel coronary artery calcifications. No pericardial effusion. Mediastinum/Nodes: Diminished size of previously enlarged AP window lymph node, measuring 1.2 x 0.8 cm, previously 2.0 x 1.3 cm (series 2, image 26). No persistently enlarged mediastinal or hilar lymph nodes. Thyroid gland, trachea, and esophagus demonstrate no significant findings. Lungs/Pleura: Mild centrilobular emphysema and diffuse bilateral bronchial wall thickening. Small left pleural effusion, slightly increased compared to prior examination. Left upper lobe mass is similar in size at approximately 5.1 x 1.7 cm (series 4, image 66). There is new  adjacent heterogeneous consolidation and bandlike ground-glass (series 4, image 64). Musculoskeletal: No chest wall abnormality. No acute osseous findings. CT ABDOMEN PELVIS FINDINGS Hepatobiliary: No solid liver abnormality is seen. No gallstones, gallbladder wall thickening, or biliary dilatation. Pancreas: Unremarkable. No pancreatic ductal dilatation or surrounding inflammatory changes. Spleen: Normal in size without significant abnormality. Adrenals/Urinary Tract: Adrenal glands are unremarkable. Unchanged, thick-walled mixed solid and cystic lesion arising from the peripheral superior pole of the right kidney measuring 3.0 x 2.6 cm (series 2, image 67). Unchanged tiny dependent bladder calculi (series 2, image 106, series 6, image 70). Stomach/Bowel: Stomach is within normal limits. Appendix appears normal. No evidence of bowel wall thickening, distention, or inflammatory changes. Vascular/Lymphatic: Aortic atherosclerosis. No enlarged abdominal or pelvic lymph nodes. Multiple additional simple, benign bilateral renal cortical cysts, for which no further follow-up or characterization is required. Reproductive: No mass or other abnormality. Other: No abdominal wall hernia or abnormality. No ascites. Musculoskeletal: No acute osseous findings. IMPRESSION: 1. Left upper lobe mass is similar in size at approximately 5.1 x 1.7 cm. There is new adjacent heterogeneous consolidation and bandlike ground-glass, consistent with developing radiation pneumonitis and fibrosis. 2. Diminished size of previously enlarged AP window lymph node consistent with treatment response. No persistently enlarged mediastinal or hilar lymph nodes. 3. Small left pleural effusion, slightly increased compared to prior examination. 4. Unchanged, thick-walled mixed solid and cystic lesion arising from the peripheral superior pole of the right kidney measuring 3.0 x 2.6 cm, consistent with a small incidentally present renal cell carcinoma  (Bosniak category IV). 5. Emphysema and diffuse bilateral bronchial wall thickening. 6. Coronary artery disease. 7. Unchanged tiny dependent bladder calculi. Aortic Atherosclerosis (ICD10-I70.0) and Emphysema (ICD10-J43.9). Electronically Signed   By: Delanna Ahmadi M.D.   On: 11/20/2022 09:53     Assessment and plan- Patient is a 88 y.o. male with history of stage III squamous cell carcinoma of the left upper lobe T3 N2 M0 s/p concurrent weekly CarboTaxol with radiation.  He is here for on treatment assessment prior to cycle 4 of adjuvant durvalumab and discuss CT scan results and further management  I have reviewed CT chest abdomen pelvis images independently and discussed findings with the patient which continues to show response to treatment so far.  Decrease in the size of AP window lymph nodes.  He still has left upper lobe lung mass which measures 5.1 x 1.7 cm and similar to prior were explained to the patient and family that this could represent sequelae of prior treatment and may not indicate active cancer.  As long as there is no overt progression we would continue with maintenance durvalumab for 1 year  Counts okay to proceed with cycle 4 of adjuvant durvalumab today and I will see him back in 4 weeks for cycle 5   Visit Diagnosis 1. Primary malignant neoplasm of left upper lobe of lung ([place])   2. Encounter for  antineoplastic immunotherapy   3. Goals of care, counseling/discussion      Dr. Randa Evens, MD, MPH [facility] [id number] 11/21/2022 4:11 PM

## 2022-11-24 ENCOUNTER — Ambulatory Visit: Payer: Medicare HMO | Admitting: Radiation Oncology

## 2022-11-26 NOTE — Telephone Encounter (Signed)
This message was sent to the wrong person on 11/21/22.  Linus Salmons and she no longer works here)    The patient called wanting refills of the oxycodone 5 mg. Sent to [facility].  He would like to get it 3 months at a time like Dr.Gilbert use to write it.   I told the patient I would ask if 3 prescriptions could be sent with different dates for filling on them and let him know.

## 2022-12-03 ENCOUNTER — Ambulatory Visit (INDEPENDENT_AMBULATORY_CARE_PROVIDER_SITE_OTHER): Payer: Medicare HMO

## 2022-12-03 ENCOUNTER — Other Ambulatory Visit: Payer: Self-pay | Admitting: Family Medicine

## 2022-12-03 DIAGNOSIS — I480 Paroxysmal atrial fibrillation: Secondary | ICD-10-CM

## 2022-12-03 LAB — POCT INR
INR: 3.8 — AB (ref 2.0–3.0)
POC INR: 3.8
PT: 45.1

## 2022-12-03 MED ORDER — OXYCODONE HCL 5 MG PO TABS: 5.0000 mg | ORAL_TABLET | Freq: Four times a day (QID) | ORAL | 0 refills | Status: AC | PRN

## 2022-12-03 NOTE — Telephone Encounter (Signed)
Reviewed message requesting oxycodone 5mg  every 6 hours PRN   Will provide 30 tabs, patient appears to use medication as last resort for severe pain.  Recommended visit to discuss chronic controlled medications in April or May to discuss efficacy and usage of medication as well as medication management moving forward with new PCP Noted that patient has chronic follow up scheduled for June 25 with myself   Reviewed PDMP Last fill: 06/25/2022 for 90 tabs  Last RX: 04/22/22 by prior PCP (Dr. Sullivan Lone) Refill for 90 tabs

## 2022-12-03 NOTE — Patient Instructions (Signed)
Description   5 mg daily except 4 mg on M - W - F. F/ U 2 weeks

## 2022-12-17 ENCOUNTER — Ambulatory Visit (INDEPENDENT_AMBULATORY_CARE_PROVIDER_SITE_OTHER): Payer: Medicare HMO

## 2022-12-17 DIAGNOSIS — I480 Paroxysmal atrial fibrillation: Secondary | ICD-10-CM | POA: Diagnosis not present

## 2022-12-17 LAB — POCT INR
INR: 2.7 (ref 2.0–3.0)
POC INR: 2.7
PT: 32.6

## 2022-12-17 NOTE — Patient Instructions (Signed)
Description   5 mg daily except 4 mg on M - W - F. F/ U 2 weeks     

## 2022-12-19 ENCOUNTER — Inpatient Hospital Stay: Payer: Medicare HMO

## 2022-12-19 ENCOUNTER — Inpatient Hospital Stay: Payer: Medicare HMO | Attending: Oncology | Admitting: Oncology

## 2022-12-19 ENCOUNTER — Encounter: Payer: Self-pay | Admitting: Oncology

## 2022-12-19 VITALS — BP 161/90 | HR 50 | Temp 96.5°F | Resp 16 | Ht 69.0 in | Wt 164.0 lb

## 2022-12-19 DIAGNOSIS — C3412 Malignant neoplasm of upper lobe, left bronchus or lung: Secondary | ICD-10-CM

## 2022-12-19 DIAGNOSIS — Z5112 Encounter for antineoplastic immunotherapy: Secondary | ICD-10-CM | POA: Insufficient documentation

## 2022-12-19 LAB — COMPREHENSIVE METABOLIC PANEL
ALT: 17 U/L (ref 0–44)
AST: 22 U/L (ref 15–41)
Albumin: 3.5 g/dL (ref 3.5–5.0)
Alkaline Phosphatase: 60 U/L (ref 38–126)
Anion gap: 4 — ABNORMAL LOW (ref 5–15)
BUN: 22 mg/dL (ref 8–23)
CO2: 25 mmol/L (ref 22–32)
Calcium: 8.4 mg/dL — ABNORMAL LOW (ref 8.9–10.3)
Chloride: 107 mmol/L (ref 98–111)
Creatinine, Ser: 1.14 mg/dL (ref 0.61–1.24)
GFR, Estimated: >60 mL/min (ref >60)
Glucose, Bld: 111 mg/dL — ABNORMAL HIGH (ref 70–99)
Potassium: 3.9 mmol/L (ref 3.5–5.1)
Sodium: 136 mmol/L (ref 135–145)
Total Bilirubin: 1 mg/dL (ref 0.3–1.2)
Total Protein: 5.8 g/dL — ABNORMAL LOW (ref 6.5–8.1)

## 2022-12-19 LAB — CBC WITH DIFFERENTIAL/PLATELET
Abs Immature Granulocytes: 0 10*3/uL (ref 0.00–0.07)
Basophils Absolute: 0 10*3/uL (ref 0.0–0.1)
Basophils Relative: 1 %
Eosinophils Absolute: 0.2 10*3/uL (ref 0.0–0.5)
Eosinophils Relative: 5 %
HCT: 30.3 % — ABNORMAL LOW (ref 39.0–52.0)
Hemoglobin: 10.3 g/dL — ABNORMAL LOW (ref 13.0–17.0)
Immature Granulocytes: 0 %
Lymphocytes Relative: 19 %
Lymphs Abs: 0.6 10*3/uL — ABNORMAL LOW (ref 0.7–4.0)
MCH: 33.9 pg (ref 26.0–34.0)
MCHC: 34 g/dL (ref 30.0–36.0)
MCV: 99.7 fL (ref 80.0–100.0)
Monocytes Absolute: 0.5 10*3/uL (ref 0.1–1.0)
Monocytes Relative: 14 %
Neutro Abs: 1.9 10*3/uL (ref 1.7–7.7)
Neutrophils Relative %: 61 %
Platelets: 110 10*3/uL — ABNORMAL LOW (ref 150–400)
RBC: 3.04 MIL/uL — ABNORMAL LOW (ref 4.22–5.81)
RDW: 13.7 % (ref 11.5–15.5)
WBC: 3.1 10*3/uL — ABNORMAL LOW (ref 4.0–10.5)
nRBC: 0 % (ref 0.0–0.2)

## 2022-12-19 MED ORDER — HEPARIN SOD (PORK) LOCK FLUSH 100 UNIT/ML IV SOLN
500.0000 [IU] | Freq: Once | INTRAVENOUS | Status: AC | PRN
  Administered 2022-12-19: 500 [IU]
  Filled 2022-12-19: qty 5

## 2022-12-19 MED ORDER — SODIUM CHLORIDE 0.9 % IV SOLN
Freq: Once | INTRAVENOUS | Status: AC
  Filled 2022-12-19: qty 250

## 2022-12-19 MED ORDER — SODIUM CHLORIDE 0.9 % IV SOLN
1500.0000 mg | Freq: Once | INTRAVENOUS | Status: AC
  Administered 2022-12-19: 1500 mg via INTRAVENOUS
  Filled 2022-12-19: qty 30

## 2022-12-19 NOTE — Progress Notes (Signed)
No concerns. 

## 2022-12-19 NOTE — Progress Notes (Signed)
Hematology/Oncology Consult note [facility]  [phone] [phone]  Patient Care Team: Ronnald Ramp, MD as PCP - General (Family Medicine) Vanna Scotland, MD as Consulting Physician (Urology) Lamar Blinks, MD as Consulting Physician (Cardiology) Lockie Mola, MD as Referring Physician (Ophthalmology) Deirdre Evener, MD (Dermatology) Glory Buff, RN as Oncology Nurse Navigator Creig Hines, MD as Consulting Physician (Oncology) Salena Saner, MD as Consulting Physician (Pulmonary Disease)   Name of the [name]  [id number]  Apr 24, 1935   Date of visit: 12/19/22  Diagnosis-  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  He was found to have a left upper lobe lung mass measuring 5.5 x 3.7 x 2.8 cm.  It was confluent with the left hilar lymph node.  Enlarged AP window lymph node measuring 16 mm.  Enlarged left paratracheal lymph node.  Low-attenuation 7 mm lesion in the liver.  7 mm cyst in the uncinate process of the pancreas for which follow-up was not recommended.  Bilateral kidney cysts.  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  Prostatic enlargement.   PET CT is can showed hypermetabolic left upper lobe lung mass measuring 5.4 x 3.9 cm with an SUV of 15.1.  Ipsilateral mediastinal lymph node metastases with index lymph node measuring 1.7 with an SUV of 9.8.  No evidence of distant metastatic disease.  Biopsy of the left upper lobe as well as station 4R lymph node was consistent with squamous cell carcinoma   NGS testing did not show any actionable mutations.  PD-L1 35%.   MSI stable.  MRI brain was negative for metastatic disease   Patient completed concurrent chemoradiation with weekly CarboTaxol chemotherapy in December 2023.  Scans following that showed partial response to treatment.  Patient started adjuvant durvalumab in January 2024     Interval history-patient has chronic waxing and waning bilateral lower extremity edema.  He has a small wound over his right lower extremity.  Tolerating durvalumab well without any significant side effects  ECOG PS- 1 Pain scale- 0   Review of systems- Review of Systems  Constitutional:  Negative for chills, fever, malaise/fatigue and weight loss.  HENT:  Negative for congestion, ear discharge and nosebleeds.   Eyes:  Negative for blurred vision.  Respiratory:  Negative for cough, hemoptysis, sputum production, shortness of breath and wheezing.   Cardiovascular:  Positive for leg swelling. Negative for chest pain, palpitations, orthopnea and claudication.  Gastrointestinal:  Negative for abdominal pain, blood in stool, constipation, diarrhea, heartburn, melena, nausea and vomiting.  Genitourinary:  Negative for dysuria, flank pain, frequency, hematuria and urgency.  Musculoskeletal:  Negative for back pain, joint pain and myalgias.  Skin:  Negative for rash.  Neurological:  Negative for dizziness, tingling, focal weakness, seizures, weakness and headaches.  Endo/Heme/Allergies:  Does not bruise/bleed easily.  Psychiatric/Behavioral:  Negative for depression and suicidal ideas. The patient does not have insomnia.       Allergies  Allergen Reactions   Cefdinir Nausea Only    hypersensitivity to smell   Doxycycline     Sun sensitivity   Prednisone     Hallucinations    Sertraline  Other (See Comments)    Hallucinations      Past Medical History:  Diagnosis Date   A-fib (HCC)    a.) CHA2DS2-VASc = 6 (age x2, HTN, DVT x2, vascular disease history). b.) rate/rhythm maintained on oral carvedilol; chronically  anticoagulated using daily warfarin.   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   Bladder calculi    BPH (benign prostatic hyperplasia)    DDD (degenerative disc disease), lumbar    a.) s/p LEFT laminectomy L5   Diverticulosis    DOE (dyspnea on exertion)    DVT (deep venous thrombosis) (HCC)    Elevated PSA    GERD (gastroesophageal reflux disease)    H/O degenerative disc disease    HLD (hyperlipidemia)    Hypertension    Lipoma of arm    Lipomatosis    Long term current use of anticoagulant    a.) warfarin   Lung cancer (HCC)    Mass of upper lobe of left lung 04/24/2022   a.)  CT chest 04/24/2022: Multilobulated perihilar mass measuring 5.3 x 3.7 x 2.8 cm with associated LEFT hilar and mediastinal LAD; b.)  PET CT 05/07/2022: Hypermetabolic LEFT upper lobe lung mass (max SUV 15.1); ipsilateral nodal metastasis --> presuming a NSC histology, 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   NICM (nonischemic cardiomyopathy) (HCC) 11/22/2015   a.) TTE 11/22/2015: EF 40%. b.) TTE 12/27/2018: EF 45%. c.) TTE 08/30/2020: EF 45-50%.   Night sweats    Obesity    Osteoarthritis    RBBB (right bundle branch block)    Right inguinal hernia    VHD (valvular heart disease) 11/22/2015   a.) TTE 11/22/2015: EF 40%; mod MR, mod-sev TR, triv PR; mod BAE, mild RV enlargement. b.) TTE 12/27/2018: EF 45%; mod MR, mod-sev TR, triv PR; mod BAE, mild RV enlargement. c.) TTE 08/30/2020: EF 45-50%; mild MR/TR.     Past Surgical History:  Procedure Laterality Date   Bilateral Radical Keratotomy Bilateral 1997   COLONOSCOPY WITH PROPOFOL N/A 01/11/2016   Procedure: COLONOSCOPY WITH PROPOFOL;  Surgeon: Scot Jun, MD;  Location: [facility] ENDOSCOPY;  Service:  Endoscopy;  Laterality: N/A; repeat 3 years, tubular adenoma   DENTAL SURGERY     Patient had implants   FLEXIBLE BRONCHOSCOPY Left 05/19/2022   Procedure: FLEXIBLE BRONCHOSCOPY;  Surgeon: Salena Saner, MD;  Location: [facility] ORS;  Service: Cardiopulmonary;  Laterality: Left;   GUM SURGERY     IR IMAGING GUIDED PORT INSERTION  06/13/2022   L4 - S1 decompression Left    PROSTATE BIOPSY     TONSILLECTOMY AND ADENOIDECTOMY  age 60   VIDEO BRONCHOSCOPY WITH ENDOBRONCHIAL ULTRASOUND Left 05/19/2022   Procedure: VIDEO BRONCHOSCOPY WITH ENDOBRONCHIAL ULTRASOUND;  Surgeon: Salena Saner, MD;  Location: [facility] ORS;  Service: Cardiopulmonary;  Laterality: Left;   XI ROBOTIC ASSISTED INGUINAL HERNIA REPAIR WITH MESH Right 10/21/2021   Procedure: XI ROBOTIC ASSISTED INGUINAL HERNIA REPAIR WITH MESH;  Surgeon: Campbell Lerner, MD;  Location: [facility] ORS;  Service: General;  Laterality: Right;    Social History   Socioeconomic History   Marital status: Married    Spouse name: Doris   Number of children: 3   Years of education:  Not on file   Highest education level: Associate degree: occupational, technical, or vocational program  Occupational History   Occupation: retired  Tobacco Use   Smoking status: Former    Types: Cigarettes    Quit date: 08/26/1971    Years since quitting: 51.3    Passive exposure: Never   Smokeless tobacco: Never   Tobacco comments:    Smoked for about 20 years.  Vaping Use   Vaping Use: Never used  Substance and Sexual Activity   Alcohol use: Not Currently   Drug use: No   Sexual activity: Not Currently  Other Topics Concern   Not on file  Social History Narrative   Not on file   Social Determinants of Health   Financial Resource Strain: Low Risk  (12/18/2021)   Overall Financial Resource Strain (CARDIA)    Difficulty of Paying Living Expenses: Not hard at all  Food Insecurity: No Food Insecurity (12/18/2021)   Hunger Vital Sign    Worried About Running  Out of Food in the Last Year: Never true    Ran Out of Food in the Last Year: Never true  Transportation Needs: No Transportation Needs (12/18/2021)   PRAPARE - Administrator, Civil Service (Medical): No    Lack of Transportation (Non-Medical): No  Physical Activity: Sufficiently Active (12/18/2021)   Exercise Vital Sign    Days of Exercise per Week: 7 days    Minutes of Exercise per Session: 50 min  Stress: No Stress Concern Present (12/18/2021)   [employer] of Occupational Health - Occupational Stress Questionnaire    Feeling of Stress : Only a little  Social Connections: Moderately Integrated (12/18/2021)   Social Connection and Isolation Panel [NHANES]    Frequency of Communication with Friends and Family: Twice a week    Frequency of Social Gatherings with Friends and Family: Once a week    Attends Religious Services: More than 4 times per year    Active Member of [employer] or Organizations: No    Attends Banker Meetings: Never    Marital Status: Married  Catering manager Violence: Not At Risk (12/18/2021)   Humiliation, Afraid, Rape, and Kick questionnaire    Fear of Current or Ex-Partner: No    Emotionally Abused: No    Physically Abused: No    Sexually Abused: No    Family History  Problem Relation Age of Onset   Cancer Mother        secondary to cancinoma of the jaw from her dipping snuff.   Heart attack Father    CAD Father    Hypertension Sister    Diabetes Son        Type I diabetes   Prostate cancer Neg Hx    Kidney cancer Neg Hx      Current Outpatient Medications:    amLODipine (NORVASC) 5 MG tablet, TAKE 1 TABLET (5 MG TOTAL) BY MOUTH DAILY., Disp: 90 tablet, Rfl: 3   benazepril (LOTENSIN) 40 MG tablet, TAKE 1 TABLET EVERY DAY, Disp: 90 tablet, Rfl: 3   carvedilol (COREG) 6.25 MG tablet, TAKE 1 TABLET (6.25 MG TOTAL) BY MOUTH 2 (TWO) TIMES DAILY., Disp: 180 tablet, Rfl: 3   Cyanocobalamin (VITAMIN B 12 PO), Take 1 capsule by mouth  2 (two) times daily. 1000 mcg each capsule, Disp: , Rfl:    doxazosin (CARDURA) 8 MG tablet, TAKE 1 TABLET (8 MG TOTAL) BY MOUTH DAILY., Disp: 90 tablet, Rfl: 3   finasteride (PROSCAR) 5  MG tablet, Take 1 tablet (5 mg total) by mouth daily., Disp: 90 tablet, Rfl: 3   gabapentin (NEURONTIN) 300 MG capsule, TAKE 1 CAPSULE EVERY MORNING, 1 CAPSULE IN THE AFTERNOON AND 2 CAPSULES AT BEDTIME, Disp: 360 capsule, Rfl: 2   Magnesium 500 MG TABS, Take 500 mg by mouth daily., Disp: , Rfl:    Multiple Vitamin (MULTI-VITAMIN) tablet, Take 1 tablet by mouth daily. , Disp: , Rfl:    omeprazole (PRILOSEC) 20 MG capsule, Take 20 mg by mouth daily., Disp: , Rfl:    oxyCODONE (OXY IR/ROXICODONE) 5 MG immediate release tablet, Take 1 tablet (5 mg total) by mouth every 6 (six) hours as needed for severe pain., Disp: 90 tablet, Rfl: 0   triamterene-hydrochlorothiazide (MAXZIDE-25) 37.5-25 MG tablet, TAKE 1/2 TABLET EVERY DAY, Disp: 45 tablet, Rfl: 1   warfarin (COUMADIN) 5 MG tablet, Take 1 tablet (5 mg total) by mouth daily., Disp: 90 tablet, Rfl: 1 No current facility-administered medications for this visit.  Facility-Administered Medications Ordered in Other Visits:    0.9 %  sodium chloride infusion, , Intravenous, Once, Creig Hines, MD   durvalumab (IMFINZI) 1,500 mg in sodium chloride 0.9 % 100 mL chemo infusion, 1,500 mg, Intravenous, Once, Creig Hines, MD   heparin lock flush 100 UNIT/ML injection, , , ,    heparin lock flush 100 unit/mL, 500 Units, Intracatheter, Once PRN, Creig Hines, MD  Physical exam:  Vitals:   12/19/22 1313 12/19/22 1320  BP: (!) 162/86 (!) 161/90  Pulse: 79 (!) 50  Resp: 16   Temp: (!) 96.5 F (35.8 C)   TempSrc: Tympanic   SpO2: 100%   Weight: 164 lb (74.4 kg)   Height: 5\' 9"  (1.753 m)    Physical Exam Cardiovascular:     Rate and Rhythm: Normal rate and regular rhythm.     Heart sounds: Normal heart sounds.  Pulmonary:     Effort: Pulmonary effort is normal.      Breath sounds: Normal breath sounds.  Abdominal:     General: Bowel sounds are normal.     Palpations: Abdomen is soft.  Musculoskeletal:     Comments: Bilateral +1 pitting edema.  There is a superficial ulceration noted over the right lower extremity without any overt signs of cellulitis  Skin:    General: Skin is warm and dry.  Neurological:     Mental Status: He is alert and oriented to person, place, and time.         Latest Ref Rng & Units 12/19/2022   12:50 PM  CMP  Glucose 70 - 99 mg/dL 960   BUN 8 - 23 mg/dL 22   Creatinine 4.54 - 1.24 mg/dL 0.98   Sodium 119 - 147 mmol/L 136   Potassium 3.5 - 5.1 mmol/L 3.9   Chloride 98 - 111 mmol/L 107   CO2 22 - 32 mmol/L 25   Calcium 8.9 - 10.3 mg/dL 8.4   Total Protein 6.5 - 8.1 g/dL 5.8   Total Bilirubin 0.3 - 1.2 mg/dL 1.0   Alkaline Phos 38 - 126 U/L 60   AST 15 - 41 U/L 22   ALT 0 - 44 U/L 17       Latest Ref Rng & Units 12/19/2022   12:50 PM  CBC  WBC 4.0 - 10.5 K/uL 3.1   Hemoglobin 13.0 - 17.0 g/dL 82.9   Hematocrit 56.2 - 52.0 % 30.3   Platelets 150 - 400 K/uL 110  Assessment and plan- Patient is a 88 y.o. male with history of stage III squamous cell carcinoma of the left upper lobe T3 N2 M0 s/p concurrent weekly CarboTaxol with radiation.  He is here for on treatment assessment prior to cycle 5 of adjuvant durvalumab  Counts okay to proceed with cycle 5 of adjuvant durvalumab today and I will see him back in 4 weeks for cycle 6.  Plan to repeat scans after 7 cycles.  Bilateral lower extremity edema: This has been waxing and waning and overall chronic.  Continue to monitor   Visit Diagnosis 1. Primary malignant neoplasm of left upper lobe of lung (HCC)   2. Encounter for antineoplastic immunotherapy      Dr. Owens Shark, MD, MPH [facility] [phone] 12/19/2022 1:53 PM

## 2022-12-19 NOTE — Patient Instructions (Signed)
[facility]  Discharge Instructions: Thank you for choosing [facility] to provide your oncology and hematology care.  If you have a lab appointment with the Cancer Center, please go directly to the Cancer Center and check in at the registration area.  Wear comfortable clothing and clothing appropriate for easy access to any Portacath or PICC line.   We strive to give you quality time with your provider. You may need to reschedule your appointment if you arrive late (15 or more minutes).  Arriving late affects you and other patients whose appointments are after yours.  Also, if you miss three or more appointments without notifying the office, you may be dismissed from the clinic at the provider's discretion.      For prescription refill requests, have your pharmacy contact our office and allow 72 hours for refills to be completed.    Today you received the following chemotherapy and/or immunotherapy agents- Durvalumab      To help prevent nausea and vomiting after your treatment, we encourage you to take your nausea medication as directed.  BELOW ARE SYMPTOMS THAT SHOULD BE REPORTED IMMEDIATELY: *FEVER GREATER THAN 100.4 F (38 C) OR HIGHER *CHILLS OR SWEATING *NAUSEA AND VOMITING THAT IS NOT CONTROLLED WITH YOUR NAUSEA MEDICATION *UNUSUAL SHORTNESS OF BREATH *UNUSUAL BRUISING OR BLEEDING *URINARY PROBLEMS (pain or burning when urinating, or frequent urination) *BOWEL PROBLEMS (unusual diarrhea, constipation, pain near the anus) TENDERNESS IN MOUTH AND THROAT WITH OR WITHOUT PRESENCE OF ULCERS (sore throat, sores in mouth, or a toothache) UNUSUAL RASH, SWELLING OR PAIN  UNUSUAL VAGINAL DISCHARGE OR ITCHING   Items with * indicate a potential emergency and should be followed up as soon as possible or go to the Emergency Department if any problems should occur.  Please show the CHEMOTHERAPY ALERT CARD or IMMUNOTHERAPY ALERT CARD at check-in  to the Emergency Department and triage nurse.  Should you have questions after your visit or need to cancel or reschedule your appointment, please contact [facility] [place] [place] [facility]  [phone] and follow the prompts.  Office hours are 8:00 a.m. to 4:30 p.m. Monday - Friday. Please note that voicemails left after 4:00 p.m. may not be returned until the following business day.  We are closed weekends and major holidays. You have access to a nurse at all times for urgent questions. Please call the main number to the clinic [phone] and follow the prompts.  For any non-urgent questions, you may also contact your provider using MyChart. We now offer e-Visits for anyone 18 and older to request care online for non-urgent symptoms. For details visit [email or web address].   Also download the MyChart app! Go to the app store, search "MyChart", open the app, select [facility], and log in with your MyChart username and password.    

## 2022-12-22 ENCOUNTER — Telehealth: Payer: Self-pay | Admitting: Family Medicine

## 2022-12-22 ENCOUNTER — Other Ambulatory Visit: Payer: Self-pay | Admitting: Family Medicine

## 2022-12-22 MED ORDER — BENAZEPRIL HCL 40 MG PO TABS: 40.0000 mg | ORAL_TABLET | Freq: Every day | ORAL | 1 refills | Status: AC

## 2022-12-22 NOTE — Telephone Encounter (Signed)
[facility] pharmacy faxed refill request for the following medications:    benazepril (LOTENSIN) 40 MG tablet    Please advise

## 2022-12-23 ENCOUNTER — Ambulatory Visit (INDEPENDENT_AMBULATORY_CARE_PROVIDER_SITE_OTHER): Payer: Medicare HMO

## 2022-12-23 VITALS — BP 124/80 | Ht 69.0 in | Wt 162.2 lb

## 2022-12-23 DIAGNOSIS — Z0000 Encounter for general adult medical examination without abnormal findings: Secondary | ICD-10-CM

## 2022-12-23 NOTE — Progress Notes (Signed)
Subjective:   Casey Gallegos is a 88 y.o. male who presents for Medicare Annual/Subsequent preventive examination.  Review of Systems    Cardiac Risk Factors include: advanced age (>90men, >41 women);hypertension;male gender    Objective:    Today's Vitals   12/23/22 0902  BP: 124/80  Weight: 162 lb 3.2 oz (73.6 kg)  Height: 5\' 9"  (1.753 m)   Body mass index is 23.95 kg/m.     12/23/2022    9:20 AM 12/19/2022    1:16 PM 11/21/2022    9:20 AM 09/26/2022    2:04 PM 09/25/2022   10:18 AM 08/20/2022   10:58 AM 08/04/2022    9:42 AM  Advanced Directives  Does Patient Have a Medical Advance Directive? Yes Yes Yes Yes Yes Yes Yes  Type of Estate agent of [place];Living will Healthcare Power of [place];Living will Healthcare Power of [place];Living will Healthcare Power of [place];Living will Healthcare Power of [place];Living will Healthcare Power of [place];Living will Healthcare Power of [place];Living will  Does patient want to make changes to medical advance directive?     No - Patient declined  No - Patient declined  Copy of Healthcare Power of Attorney in Chart?     No - copy requested  No - copy requested  Would patient like information on creating a medical advance directive?     No - Patient declined  No - Patient declined    Current Medications (verified) Outpatient Encounter Medications as of 12/23/2022  Medication Sig   amLODipine (NORVASC) 5 MG tablet TAKE 1 TABLET (5 MG TOTAL) BY MOUTH DAILY.   benazepril (LOTENSIN) 40 MG tablet Take 1 tablet (40 mg total) by mouth daily.   carvedilol (COREG) 6.25 MG tablet TAKE 1 TABLET (6.25 MG TOTAL) BY MOUTH 2 (TWO) TIMES DAILY.   Cyanocobalamin (VITAMIN B 12 PO) Take 1 capsule by mouth 2 (two) times daily. 1000 mcg each capsule   doxazosin (CARDURA) 8 MG tablet TAKE 1 TABLET (8 MG TOTAL) BY MOUTH DAILY.   finasteride (PROSCAR) 5 MG tablet Take 1 tablet (5 mg total) by mouth daily.   gabapentin (NEURONTIN)  300 MG capsule TAKE 1 CAPSULE EVERY MORNING, 1 CAPSULE IN THE AFTERNOON AND 2 CAPSULES AT BEDTIME   Magnesium 500 MG TABS Take 500 mg by mouth daily.   Multiple Vitamin (MULTI-VITAMIN) tablet Take 1 tablet by mouth daily.    omeprazole (PRILOSEC) 20 MG capsule Take 20 mg by mouth daily.   oxyCODONE (OXY IR/ROXICODONE) 5 MG immediate release tablet Take 1 tablet (5 mg total) by mouth every 6 (six) hours as needed for severe pain.   triamterene-hydrochlorothiazide (MAXZIDE-25) 37.5-25 MG tablet TAKE 1/2 TABLET EVERY DAY   warfarin (COUMADIN) 5 MG tablet Take 1 tablet (5 mg total) by mouth daily.   Facility-Administered Encounter Medications as of 12/23/2022  Medication   heparin lock flush 100 UNIT/ML injection    Allergies (verified) Cefdinir, Doxycycline, Prednisone, and Sertraline   History: Past Medical History:  Diagnosis Date   A-fib (HCC)    a.) CHA2DS2-VASc = 6 (age x2, HTN, DVT x2, vascular disease history). b.) rate/rhythm maintained on oral carvedilol; chronically anticoagulated using daily warfarin.   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   Bladder  calculi    BPH (benign prostatic hyperplasia)    DDD (degenerative disc disease), lumbar    a.) s/p LEFT laminectomy L5   Diverticulosis    DOE (dyspnea on exertion)    DVT (deep venous thrombosis) (HCC)    Elevated PSA    GERD (gastroesophageal reflux disease)    H/O degenerative disc disease    HLD (hyperlipidemia)    Hypertension    Lipoma of arm    Lipomatosis    Long term current use of anticoagulant    a.) warfarin   Lung cancer (HCC)    Mass of upper lobe of left lung 04/24/2022   a.)  CT chest 04/24/2022: Multilobulated perihilar mass measuring 5.3 x 3.7 x 2.8 cm with associated LEFT hilar and mediastinal  LAD; b.)  PET CT 05/07/2022: Hypermetabolic LEFT upper lobe lung mass (max SUV 15.1); ipsilateral nodal metastasis --> presuming a NSC histology, 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   NICM (nonischemic cardiomyopathy) (HCC) 11/22/2015   a.) TTE 11/22/2015: EF 40%. b.) TTE 12/27/2018: EF 45%. c.) TTE 08/30/2020: EF 45-50%.   Night sweats    Obesity    Osteoarthritis    RBBB (right bundle branch block)    Right inguinal hernia    VHD (valvular heart disease) 11/22/2015   a.) TTE 11/22/2015: EF 40%; mod MR, mod-sev TR, triv PR; mod BAE, mild RV enlargement. b.) TTE 12/27/2018: EF 45%; mod MR, mod-sev TR, triv PR; mod BAE, mild RV enlargement. c.) TTE 08/30/2020: EF 45-50%; mild MR/TR.   Past Surgical History:  Procedure Laterality Date   Bilateral Radical Keratotomy Bilateral 1997   COLONOSCOPY WITH PROPOFOL N/A 01/11/2016   Procedure: COLONOSCOPY WITH PROPOFOL;  Surgeon: Scot Jun, MD;  Location: [facility] ENDOSCOPY;  Service: Endoscopy;  Laterality: N/A; repeat 3 years, tubular adenoma   DENTAL SURGERY     Patient had implants   FLEXIBLE BRONCHOSCOPY Left 05/19/2022   Procedure: FLEXIBLE BRONCHOSCOPY;  Surgeon: Salena Saner, MD;  Location: [facility] ORS;  Service: Cardiopulmonary;  Laterality: Left;   GUM SURGERY     IR IMAGING GUIDED PORT INSERTION  06/13/2022   L4 - S1 decompression Left    PROSTATE BIOPSY     TONSILLECTOMY AND ADENOIDECTOMY  age 54   VIDEO BRONCHOSCOPY WITH ENDOBRONCHIAL ULTRASOUND Left 05/19/2022   Procedure: VIDEO BRONCHOSCOPY WITH ENDOBRONCHIAL ULTRASOUND;  Surgeon: Salena Saner, MD;  Location: [facility] ORS;  Service: Cardiopulmonary;  Laterality: Left;   XI ROBOTIC ASSISTED INGUINAL HERNIA REPAIR WITH MESH Right 10/21/2021   Procedure: XI ROBOTIC ASSISTED INGUINAL HERNIA REPAIR WITH MESH;  Surgeon: Campbell Lerner, MD;  Location: [facility] ORS;  Service: General;  Laterality: Right;   Family History  Problem Relation Age  of Onset   Cancer Mother        secondary to cancinoma of the jaw from her dipping snuff.   Heart attack Father    CAD Father    Hypertension Sister    Diabetes Son        Type I diabetes   Prostate cancer Neg Hx    Kidney cancer Neg Hx    Social History   Socioeconomic History   Marital status: Married    Spouse name: Doris   Number of children: 3   Years of education: Not on file   Highest education level: Associate degree: occupational, Scientist, product/process development, or vocational program  Occupational History   Occupation: retired  Tobacco Use   Smoking status:  Former    Types: Cigarettes    Quit date: 08/26/1971    Years since quitting: 51.3    Passive exposure: Never   Smokeless tobacco: Never   Tobacco comments:    Smoked for about 20 years.  Vaping Use   Vaping Use: Never used  Substance and Sexual Activity   Alcohol use: Not Currently   Drug use: No   Sexual activity: Not Currently  Other Topics Concern   Not on file  Social History Narrative   Not on file   Social Determinants of Health   Financial Resource Strain: Low Risk  (12/23/2022)   Overall Financial Resource Strain (CARDIA)    Difficulty of Paying Living Expenses: Not hard at all  Food Insecurity: No Food Insecurity (12/23/2022)   Hunger Vital Sign    Worried About Running Out of Food in the Last Year: Never true    Ran Out of Food in the Last Year: Never true  Transportation Needs: No Transportation Needs (12/23/2022)   PRAPARE - Administrator, Civil Service (Medical): No    Lack of Transportation (Non-Medical): No  Physical Activity: Sufficiently Active (12/23/2022)   Exercise Vital Sign    Days of Exercise per Week: 7 days    Minutes of Exercise per Session: 50 min  Stress: No Stress Concern Present (12/18/2021)   [employer] of Occupational Health - Occupational Stress Questionnaire    Feeling of Stress : Only a little  Social Connections: Moderately Integrated (12/23/2022)   Social Connection  and Isolation Panel [NHANES]    Frequency of Communication with Friends and Family: Twice a week    Frequency of Social Gatherings with Friends and Family: Once a week    Attends Religious Services: More than 4 times per year    Active Member of [employer] or Organizations: No    Attends Banker Meetings: Never    Marital Status: Married    Tobacco Counseling Counseling given: Not Answered Tobacco comments: Smoked for about 20 years.   Clinical Intake:  Pre-visit preparation completed: Yes  Pain : No/denies pain     BMI - recorded: 23.95 Nutritional Status: BMI of 19-24  Normal Nutritional Risks: None Diabetes: No  How often do you need to have someone help you when you read instructions, pamphlets, or other written materials from your doctor or pharmacy?: 1 - Never  Diabetic?no  Interpreter Needed?: No  Comments: lives with wife Information entered by :: B.Mylen Mangan,LPN   Activities of Daily Living    12/23/2022    9:21 AM 06/13/2022    1:28 PM  In your present state of health, do you have any difficulty performing the following activities:  Hearing? 1 0  Vision? 0 0  Difficulty concentrating or making decisions? 1 0  Walking or climbing stairs? 0 0  Dressing or bathing? 0 0  Doing errands, shopping? 0   Preparing Food and eating ? N   Using the Toilet? N   In the past six months, have you accidently leaked urine? N   Do you have problems with loss of bowel control? N   Managing your Medications? N   Managing your Finances? N   Housekeeping or managing your Housekeeping? N     Patient Care Team: Ronnald Ramp, MD as PCP - General (Family Medicine) Vanna Scotland, MD as Consulting Physician (Urology) Lamar Blinks, MD as Consulting Physician (Cardiology) Lockie Mola, MD as Referring Physician (Ophthalmology) Deirdre Evener, MD (Dermatology) [place],  RN as Oncology Nurse Navigator Creig Hines, MD as Consulting  Physician (Oncology) Salena Saner, MD as Consulting Physician (Pulmonary Disease)  Indicate any recent Medical Services you may have received from other than [facility] providers in the past year (date may be approximate).     Assessment:   This is a routine wellness examination for [place].  Hearing/Vision screen Hearing Screening - Comments:: inadequate hearing Declines hearing test Vision Screening - Comments:: Adequate vision;only readers [facility]  Dietary issues and exercise activities discussed: Current Exercise Habits: Structured exercise class;Home exercise routine, Type of exercise: walking;treadmill;stretching;strength training/weights, Time (Minutes): 50, Frequency (Times/Week): 5, Weekly Exercise (Minutes/Week): 250, Intensity: Mild, Exercise limited by: cardiac condition(s);orthopedic condition(s)   Goals Addressed             This Visit's Progress    DIET - EAT MORE FRUITS AND VEGETABLES   On track    DIET - INCREASE WATER INTAKE   On track    Recommend increasing water intake to 6-8 glasses a day.        Depression Screen    12/23/2022    9:12 AM 02/20/2022    3:20 PM 02/20/2022    2:31 PM 12/18/2021    9:32 AM 10/07/2021    1:58 PM 09/10/2021   10:02 AM 05/29/2020    9:03 AM  PHQ 2/9 Scores  PHQ - 2 Score 0 2 0 1 2 2  0  PHQ- 9 Score  4   3 2      Fall Risk    12/23/2022    9:09 AM 02/20/2022    2:31 PM 12/18/2021    9:36 AM 10/07/2021    1:57 PM 09/10/2021   10:02 AM  Fall Risk   Falls in the past year? 0 0 0 0 0  Number falls in past yr: 0 0 0 0 0  Injury with Fall? 0 0 0 0 0  Risk for fall due to : No Fall Risks  No Fall Risks  Impaired balance/gait  Follow up Education provided;Falls prevention discussed  Falls evaluation completed  Falls evaluation completed    FALL RISK PREVENTION PERTAINING TO THE HOME:  Any stairs in or around the home? No  If so, are there any without handrails? No  Home free of loose throw rugs in walkways,  pet beds, electrical cords, etc? Yes  Adequate lighting in your home to reduce risk of falls? Yes   ASSISTIVE DEVICES UTILIZED TO PREVENT FALLS:  Life alert? No  Use of a cane, walker or w/c? No  Grab bars in the bathroom? Yes  Shower chair or bench in shower? Yes  Elevated toilet seat or a handicapped toilet? Yes   TIMED UP AND GO:  Was the test performed? Yes .  Length of time to ambulate 10 feet: 13 sec.   Gait slow and steady without use of assistive device  PT REFUSED MINI-COG  Cognitive Function:    02/20/2022    3:17 PM 04/15/2016    2:47 PM  MMSE - Mini Mental State Exam  Orientation to time 3 4  Orientation to Place 5 5  Registration 3 3  Attention/ Calculation 4 5  Recall 1 1  Language- name 2 objects 2 2  Language- repeat 1 1  Language- follow 3 step command 3 3  Language- read & follow direction 1 1  Write a sentence 1 1  Copy design 1 1  Total score 25 27  01/13/2017    9:14 AM  6CIT Screen  What Year? 0 points  What month? 0 points  What time? 0 points  Count back from 20 0 points  Months in reverse 0 points  Repeat phrase 2 points  Total Score 2 points    Immunizations Immunization History  Administered Date(s) Administered   Fluad Quad(high Dose 65+) 05/10/2019, 07/03/2021, 05/30/2022   Influenza, High Dose Seasonal PF 05/11/2015, 05/21/2016, 05/27/2017, 05/26/2018, 05/01/2020   PFIZER(Purple Top)SARS-COV-2 Vaccination 09/09/2019, 09/30/2019, 05/25/2020   Pneumococcal Conjugate-13 01/09/2014   Pneumococcal Polysaccharide-23 08/08/2004   Td 10/25/2008    TDAP status: Up to date  Flu Vaccine status: Up to date  Pneumococcal vaccine status: Up to date  Covid-19 vaccine status: Completed vaccines  Qualifies for Shingles Vaccine? Yes   Zostavax completed No   Shingrix Completed?: No.    Education has been provided regarding the importance of this vaccine. Patient has been advised to call insurance company to determine out of  pocket expense if they have not yet received this vaccine. Advised may also receive vaccine at local pharmacy or Health Dept. Verbalized acceptance and understanding.  Screening Tests Health Maintenance  Topic Date Due   Zoster Vaccines- Shingrix (1 of 2) Never done   DTaP/Tdap/Td (2 - Tdap) 10/26/2018   COVID-19 Vaccine (4 - 2023-24 season) 04/25/2022   INFLUENZA VACCINE  03/26/2023   Medicare Annual Wellness (AWV)  12/23/2023   Pneumonia Vaccine 68+ Years old  Completed   HPV VACCINES  Aged Out    Health Maintenance  Health Maintenance Due  Topic Date Due   Zoster Vaccines- Shingrix (1 of 2) Never done   DTaP/Tdap/Td (2 - Tdap) 10/26/2018   COVID-19 Vaccine (4 - 2023-24 season) 04/25/2022    Colorectal cancer screening: No longer required.   Lung Cancer Screening: (Low Dose CT Chest recommended if Age 66-80 years, 30 pack-year currently smoking OR have quit w/in 15years.) does not qualify.   Lung Cancer Screening Referral: no  Additional Screening:  Hepatitis C Screening: does not qualify; Completed yes  Vision Screening: Recommended annual ophthalmology exams for early detection of glaucoma and other disorders of the eye. Is the patient up to date with their annual eye exam?  Yes  Who is the provider or what is the name of the office in which the patient attends annual eye exams? Dr Inez Pilgrim If pt is not established with a provider, would they like to be referred to a provider to establish care? No .   Dental Screening: Recommended annual dental exams for proper oral hygiene  Community Resource Referral / Chronic Care Management: CRR required this visit?  No   CCM required this visit?  No      Plan:     I have personally reviewed and noted the following in the patient's chart:   Medical and social history Use of alcohol, tobacco or illicit drugs  Current medications and supplements including opioid prescriptions. Patient is currently taking opioid  prescriptions. Information provided to patient regarding non-opioid alternatives. Patient advised to discuss non-opioid treatment plan with their provider. Functional ability and status Nutritional status Physical activity Advanced directives List of other physicians Hospitalizations, surgeries, and ER visits in previous 12 months Vitals Screenings to include cognitive, depression, and falls Referrals and appointments  In addition, I have reviewed and discussed with patient certain preventive protocols, quality metrics, and best practice recommendations. A written personalized care plan for preventive services as well as general preventive health recommendations were provided  to patient.     Sue Lush, LPN   4/78/[id number]   Nurse Notes: Pt states he is doing well. He has no concerns or questions at this time.

## 2022-12-23 NOTE — Patient Instructions (Signed)
[name] , Thank you for taking time to come for your Medicare Wellness Visit. I appreciate your ongoing commitment to your health goals. Please review the following plan we discussed and let me know if I can assist you in the future.   These are the goals we discussed:  Goals      DIET - EAT MORE FRUITS AND VEGETABLES     DIET - INCREASE WATER INTAKE     Recommend increasing water intake to 6-8 glasses a day.         This is a list of the screening recommended for you and due dates:  Health Maintenance  Topic Date Due   Zoster (Shingles) Vaccine (1 of 2) Never done   DTaP/Tdap/Td vaccine (2 - Tdap) 10/26/2018   COVID-19 Vaccine (4 - 2023-24 season) 04/25/2022   Flu Shot  03/26/2023   Medicare Annual Wellness Visit  12/23/2023   Pneumonia Vaccine  Completed   HPV Vaccine  Aged Out    Advanced directives: yes  Conditions/risks identified: low falls risk  Next appointment: Follow up in one year for your annual wellness visit. 12/28/2023 @9 :15am in person  Preventive Care 65 Years and Older, Male  Preventive care refers to lifestyle choices and visits with your health care provider that can promote health and wellness. What does preventive care include? A yearly physical exam. This is also called an annual well check. Dental exams once or twice a year. Routine eye exams. Ask your health care provider how often you should have your eyes checked. Personal lifestyle choices, including: Daily care of your teeth and gums. Regular physical activity. Eating a healthy diet. Avoiding tobacco and drug use. Limiting alcohol use. Practicing safe sex. Taking low doses of aspirin every day. Taking vitamin and mineral supplements as recommended by your health care provider. What happens during an annual well check? The services and screenings done by your health care provider during your annual well check will depend on your age, overall health, lifestyle risk factors, and family  history of disease. Counseling  Your health care provider may ask you questions about your: Alcohol use. Tobacco use. Drug use. Emotional well-being. Home and relationship well-being. Sexual activity. Eating habits. History of falls. Memory and ability to understand (cognition). Work and work Astronomer. Screening  You may have the following tests or measurements: Height, weight, and BMI. Blood pressure. Lipid and cholesterol levels. These may be checked every 5 years, or more frequently if you are over 52 years old. Skin check. Lung cancer screening. You may have this screening every year starting at age 57 if you have a 30-pack-year history of smoking and currently smoke or have quit within the past 15 years. Fecal occult blood test (FOBT) of the stool. You may have this test every year starting at age 62. Flexible sigmoidoscopy or colonoscopy. You may have a sigmoidoscopy every 5 years or a colonoscopy every 10 years starting at age 5. Prostate cancer screening. Recommendations will vary depending on your family history and other risks. Hepatitis C blood test. Hepatitis B blood test. Sexually transmitted disease (STD) testing. Diabetes screening. This is done by checking your blood sugar (glucose) after you have not eaten for a while (fasting). You may have this done every 1-3 years. Abdominal aortic aneurysm (AAA) screening. You may need this if you are a current or former smoker. Osteoporosis. You may be screened starting at age 57 if you are at high risk. Talk with your health care provider about  your test results, treatment options, and if necessary, the need for more tests. Vaccines  Your health care provider may recommend certain vaccines, such as: Influenza vaccine. This is recommended every year. Tetanus, diphtheria, and acellular pertussis (Tdap, Td) vaccine. You may need a Td booster every 10 years. Zoster vaccine. You may need this after age 45. Pneumococcal  13-valent conjugate (PCV13) vaccine. One dose is recommended after age 10. Pneumococcal polysaccharide (PPSV23) vaccine. One dose is recommended after age 8. Talk to your health care provider about which screenings and vaccines you need and how often you need them. This information is not intended to replace advice given to you by your health care provider. Make sure you discuss any questions you have with your health care provider. Document Released: 09/07/2015 Document Revised: 04/30/2016 Document Reviewed: 06/12/2015 Elsevier Interactive Patient Education  2017 [place] Prevention in the Home Falls can cause injuries. They can happen to people of all ages. There are many things you can do to make your home safe and to help prevent falls. What can I do on the outside of my home? Regularly fix the edges of walkways and driveways and fix any cracks. Remove anything that might make you trip as you walk through a door, such as a raised step or threshold. Trim any bushes or trees on the path to your home. Use bright outdoor lighting. Clear any walking paths of anything that might make someone trip, such as rocks or tools. Regularly check to see if handrails are loose or broken. Make sure that both sides of any steps have handrails. Any raised decks and porches should have guardrails on the edges. Have any leaves, snow, or ice cleared regularly. Use sand or salt on walking paths during winter. Clean up any spills in your garage right away. This includes oil or grease spills. What can I do in the bathroom? Use night lights. Install grab bars by the toilet and in the tub and shower. Do not use towel bars as grab bars. Use non-skid mats or decals in the tub or shower. If you need to sit down in the shower, use a plastic, non-slip stool. Keep the floor dry. Clean up any water that spills on the floor as soon as it happens. Remove soap buildup in the tub or shower regularly. Attach  bath mats securely with double-sided non-slip rug tape. Do not have throw rugs and other things on the floor that can make you trip. What can I do in the bedroom? Use night lights. Make sure that you have a light by your bed that is easy to reach. Do not use any sheets or blankets that are too big for your bed. They should not hang down onto the floor. Have a firm chair that has side arms. You can use this for support while you get dressed. Do not have throw rugs and other things on the floor that can make you trip. What can I do in the kitchen? Clean up any spills right away. Avoid walking on wet floors. Keep items that you use a lot in easy-to-reach places. If you need to reach something above you, use a strong step stool that has a grab bar. Keep electrical cords out of the way. Do not use floor polish or wax that makes floors slippery. If you must use wax, use non-skid floor wax. Do not have throw rugs and other things on the floor that can make you trip. What can I do with  my stairs? Do not leave any items on the stairs. Make sure that there are handrails on both sides of the stairs and use them. Fix handrails that are broken or loose. Make sure that handrails are as long as the stairways. Check any carpeting to make sure that it is firmly attached to the stairs. Fix any carpet that is loose or worn. Avoid having throw rugs at the top or bottom of the stairs. If you do have throw rugs, attach them to the floor with carpet tape. Make sure that you have a light switch at the top of the stairs and the bottom of the stairs. If you do not have them, ask someone to add them for you. What else can I do to help prevent falls? Wear shoes that: Do not have high heels. Have rubber bottoms. Are comfortable and fit you well. Are closed at the toe. Do not wear sandals. If you use a stepladder: Make sure that it is fully opened. Do not climb a closed stepladder. Make sure that both sides of the  stepladder are locked into place. Ask someone to hold it for you, if possible. Clearly mark and make sure that you can see: Any grab bars or handrails. First and last steps. Where the edge of each step is. Use tools that help you move around (mobility aids) if they are needed. These include: Canes. Walkers. Scooters. Crutches. Turn on the lights when you go into a dark area. Replace any light bulbs as soon as they burn out. Set up your furniture so you have a clear path. Avoid moving your furniture around. If any of your floors are uneven, fix them. If there are any pets around you, be aware of where they are. Review your medicines with your doctor. Some medicines can make you feel dizzy. This can increase your chance of falling. Ask your doctor what other things that you can do to help prevent falls. This information is not intended to replace advice given to you by your health care provider. Make sure you discuss any questions you have with your health care provider. Document Released: 06/07/2009 Document Revised: 01/17/2016 Document Reviewed: 09/15/2014 Elsevier Interactive Patient Education  2017 [employer].

## 2022-12-24 ENCOUNTER — Other Ambulatory Visit: Payer: Self-pay

## 2023-01-05 NOTE — Progress Notes (Unsigned)
I,[name],acting as a scribe for [employer], MD.,have documented all relevant documentation on the behalf of Ronnald Ramp, MD,as directed by  Ronnald Ramp, MD while in the presence of Ronnald Ramp, MD.   Established patient visit   [name]   DOB: 06-19-1935   88 y.o. Male  MRN: [id number] Visit Date: 01/06/2023  Today's healthcare provider: Ronnald Ramp, MD   Chief Complaint  Patient presents with   Leg soreness   Coagulation Disorder   Subjective    HPI  Patient coming in for leg ulcer. Patient reports it has been there for months. Reports is not painful just sore. No discharge.  Patient also is due for PT/INR.Takes 5 mg T, Th, Sa, Sun & 4 mg on M - W - F of warfarin  Lab Results  Component Value Date   INR 2.2 01/06/2023   INR 2.7 12/17/2022   INR 2.7 12/17/2022     Medications: Outpatient Medications Prior to Visit  Medication Sig   amLODipine (NORVASC) 5 MG tablet TAKE 1 TABLET (5 MG TOTAL) BY MOUTH DAILY.   benazepril (LOTENSIN) 40 MG tablet Take 1 tablet (40 mg total) by mouth daily.   carvedilol (COREG) 6.25 MG tablet TAKE 1 TABLET (6.25 MG TOTAL) BY MOUTH 2 (TWO) TIMES DAILY.   Cyanocobalamin (VITAMIN B 12 PO) Take 1 capsule by mouth 2 (two) times daily. 1000 mcg each capsule   doxazosin (CARDURA) 8 MG tablet TAKE 1 TABLET (8 MG TOTAL) BY MOUTH DAILY.   finasteride (PROSCAR) 5 MG tablet Take 1 tablet (5 mg total) by mouth daily.   gabapentin (NEURONTIN) 300 MG capsule TAKE 1 CAPSULE EVERY MORNING, 1 CAPSULE IN THE AFTERNOON AND 2 CAPSULES AT BEDTIME   Magnesium 500 MG TABS Take 500 mg by mouth daily.   Multiple Vitamin (MULTI-VITAMIN) tablet Take 1 tablet by mouth daily.    omeprazole (PRILOSEC) 20 MG capsule Take 20 mg by mouth daily.   oxyCODONE (OXY IR/ROXICODONE) 5 MG immediate release tablet Take 1 tablet (5 mg total) by mouth every 6 (six) hours as needed for severe  pain.   triamterene-hydrochlorothiazide (MAXZIDE-25) 37.5-25 MG tablet TAKE 1/2 TABLET EVERY DAY   warfarin (COUMADIN) 5 MG tablet Take 1 tablet (5 mg total) by mouth daily.   Facility-Administered Medications Prior to Visit  Medication Dose Route Frequency Provider   heparin lock flush 100 UNIT/ML injection         Review of Systems     Objective    BP 136/76 (BP Location: Left Arm, Patient Position: Sitting, Cuff Size: Normal)   Pulse 64   Temp 97.6 F (36.4 C) (Oral)   Resp 16   Ht 5\' 9"  (1.753 m)   Wt 157 lb 11.2 oz (71.5 kg)   BMI 23.29 kg/m    Physical Exam Vitals reviewed.  Constitutional:      General: He is not in acute distress.    Appearance: Normal appearance. He is not ill-appearing.  Pulmonary:     Effort: Pulmonary effort is normal. No respiratory distress.  Neurological:     Mental Status: He is alert.          Results for orders placed or performed in visit on 01/06/23  POCT INR  Result Value Ref Range   INR 2.2 2.0 - 3.0   PT 26.4     Assessment & Plan     Problem List Items Addressed This Visit  Cardiovascular and Mediastinum   AF (paroxysmal atrial fibrillation) (HCC)    Chronic  Stable on warfarin 5mg  and 4 mg on MWF INR repeated today, within goal range  Continue current regimen       Relevant Orders   POCT INR (Completed)   Chronic venous insufficiency - Primary    Chronic RLE wound  Likely 2/2 to venous insufficiency vs arterial insufficiency causing delayed wound healing  Low suspicion for cellulitis given appearance of chronicity  Will refer for vein and vascular evaluation and defer imaging to specialist recommendation  Offered [place] today, patient prefers to wait until consultation        Relevant Orders   Ambulatory referral to Vascular Surgery     Nervous and Auditory   Neuropathy   Other Visit Diagnoses     Leg pain, lateral, right       Relevant Orders   Ambulatory referral to Vascular Surgery         Return in about 1 month (around 02/06/2023).        The entirety of the information documented in the History of Present Illness, Review of Systems and Physical Exam were personally obtained by me. Portions of this information were initially documented by Hetty Ely, CMA . I, Ronnald Ramp, MD have reviewed the documentation above for thoroughness and accuracy.    Ronnald Ramp, MD  [facility] [phone] (phone) [phone] (fax)  [facility]

## 2023-01-06 ENCOUNTER — Encounter: Payer: Self-pay | Admitting: Family Medicine

## 2023-01-06 ENCOUNTER — Ambulatory Visit (INDEPENDENT_AMBULATORY_CARE_PROVIDER_SITE_OTHER): Payer: Medicare HMO | Admitting: Family Medicine

## 2023-01-06 VITALS — BP 136/76 | HR 64 | Temp 97.6°F | Resp 16 | Ht 69.0 in | Wt 157.7 lb

## 2023-01-06 DIAGNOSIS — G629 Polyneuropathy, unspecified: Secondary | ICD-10-CM

## 2023-01-06 DIAGNOSIS — I872 Venous insufficiency (chronic) (peripheral): Secondary | ICD-10-CM

## 2023-01-06 DIAGNOSIS — M79604 Pain in right leg: Secondary | ICD-10-CM | POA: Diagnosis not present

## 2023-01-06 DIAGNOSIS — I480 Paroxysmal atrial fibrillation: Secondary | ICD-10-CM

## 2023-01-06 LAB — POCT INR
INR: 2.2 (ref 2.0–3.0)
PT: 26.4

## 2023-01-06 NOTE — Assessment & Plan Note (Signed)
Chronic  Stable on warfarin 5mg  and 4 mg on MWF INR repeated today, within goal range  Continue current regimen

## 2023-01-06 NOTE — Assessment & Plan Note (Signed)
Chronic RLE wound  Likely 2/2 to venous insufficiency vs arterial insufficiency causing delayed wound healing  Low suspicion for cellulitis given appearance of chronicity  Will refer for vein and vascular evaluation and defer imaging to specialist recommendation  Offered [place] today, patient prefers to wait until consultation

## 2023-01-12 ENCOUNTER — Telehealth: Payer: Self-pay

## 2023-01-12 NOTE — Telephone Encounter (Signed)
Referral was submitted during last office visit.   Patient appears to have appt scheduled for 02/20/23 with Dr. Wyn Quaker  Is patient requesting a sooner appointment than what it currently schedule with vascular?   Ronnald Ramp, MD  [facility]

## 2023-01-12 NOTE — Telephone Encounter (Signed)
Copied from CRM [id number]. Topic: Referral - Request for Referral >> Jan 12, 2023 10:34 AM Everette C wrote: Has patient seen PCP for this complaint? Yes.   *If NO, is insurance requiring patient see PCP for this issue before PCP can refer them? Referral for which specialty: Vein and Vascular Specialist  Preferred provider/office: Patient has no preference but would like to be seen soon  Reason for referral: right leg discomfort

## 2023-01-12 NOTE — Telephone Encounter (Addendum)
Patient would like a sooner appointment or to be refer to another doctor around the area that can see him sooner.  Maralyn Sago, Is this something you can help [place] with?

## 2023-01-14 ENCOUNTER — Ambulatory Visit: Payer: Medicare HMO

## 2023-01-14 ENCOUNTER — Telehealth: Payer: Self-pay | Admitting: Family Medicine

## 2023-01-14 ENCOUNTER — Encounter: Payer: Self-pay | Admitting: *Deleted

## 2023-01-14 ENCOUNTER — Telehealth: Payer: Self-pay

## 2023-01-14 NOTE — Telephone Encounter (Signed)
Copied from CRM [id number]. Topic: General - Other >> Jan 14, 2023  3:44 PM Patsy Lager T wrote: Reason for CRM: patient stated he would like to come in and get labs done before his 7/1 physical appt and need the lab order ready when he come. Please f/u with patient

## 2023-01-14 NOTE — Telephone Encounter (Signed)
Please advise patient to reach out 1 week prior to appointment. We can order physical labs at that time.

## 2023-01-15 NOTE — Telephone Encounter (Signed)
Spoke with patient's wife and provided phone number to vascular to request a sooner appointment

## 2023-01-15 NOTE — Telephone Encounter (Signed)
Patient's wife advised

## 2023-01-16 ENCOUNTER — Inpatient Hospital Stay: Payer: Medicare HMO

## 2023-01-16 ENCOUNTER — Inpatient Hospital Stay: Payer: Medicare HMO | Attending: Oncology

## 2023-01-16 ENCOUNTER — Encounter: Payer: Self-pay | Admitting: Oncology

## 2023-01-16 ENCOUNTER — Inpatient Hospital Stay (HOSPITAL_BASED_OUTPATIENT_CLINIC_OR_DEPARTMENT_OTHER): Payer: Medicare HMO | Admitting: Oncology

## 2023-01-16 VITALS — Resp 18

## 2023-01-16 VITALS — BP 133/58 | HR 68 | Temp 96.2°F | Ht 69.0 in | Wt 155.7 lb

## 2023-01-16 DIAGNOSIS — C3412 Malignant neoplasm of upper lobe, left bronchus or lung: Secondary | ICD-10-CM | POA: Diagnosis not present

## 2023-01-16 DIAGNOSIS — Z5112 Encounter for antineoplastic immunotherapy: Secondary | ICD-10-CM | POA: Insufficient documentation

## 2023-01-16 DIAGNOSIS — Z79620 Long term (current) use of immunosuppressive biologic: Secondary | ICD-10-CM | POA: Insufficient documentation

## 2023-01-16 LAB — CBC WITH DIFFERENTIAL/PLATELET
Abs Immature Granulocytes: 0.01 10*3/uL (ref 0.00–0.07)
Basophils Absolute: 0 10*3/uL (ref 0.0–0.1)
Basophils Relative: 1 %
Eosinophils Absolute: 0.1 10*3/uL (ref 0.0–0.5)
Eosinophils Relative: 4 %
HCT: 31.3 % — ABNORMAL LOW (ref 39.0–52.0)
Hemoglobin: 10.4 g/dL — ABNORMAL LOW (ref 13.0–17.0)
Immature Granulocytes: 0 %
Lymphocytes Relative: 15 %
Lymphs Abs: 0.5 10*3/uL — ABNORMAL LOW (ref 0.7–4.0)
MCH: 33.4 pg (ref 26.0–34.0)
MCHC: 33.2 g/dL (ref 30.0–36.0)
MCV: 100.6 fL — ABNORMAL HIGH (ref 80.0–100.0)
Monocytes Absolute: 0.4 10*3/uL (ref 0.1–1.0)
Monocytes Relative: 12 %
Neutro Abs: 2.3 10*3/uL (ref 1.7–7.7)
Neutrophils Relative %: 68 %
Platelets: 115 10*3/uL — ABNORMAL LOW (ref 150–400)
RBC: 3.11 MIL/uL — ABNORMAL LOW (ref 4.22–5.81)
RDW: 14.2 % (ref 11.5–15.5)
WBC: 3.4 10*3/uL — ABNORMAL LOW (ref 4.0–10.5)
nRBC: 0 % (ref 0.0–0.2)

## 2023-01-16 LAB — COMPREHENSIVE METABOLIC PANEL
ALT: 16 U/L (ref 0–44)
AST: 26 U/L (ref 15–41)
Albumin: 3.3 g/dL — ABNORMAL LOW (ref 3.5–5.0)
Alkaline Phosphatase: 55 U/L (ref 38–126)
Anion gap: 7 (ref 5–15)
BUN: 31 mg/dL — ABNORMAL HIGH (ref 8–23)
CO2: 25 mmol/L (ref 22–32)
Calcium: 8.6 mg/dL — ABNORMAL LOW (ref 8.9–10.3)
Chloride: 107 mmol/L (ref 98–111)
Creatinine, Ser: 1.21 mg/dL (ref 0.61–1.24)
GFR, Estimated: 58 mL/min — ABNORMAL LOW (ref >60)
Glucose, Bld: 138 mg/dL — ABNORMAL HIGH (ref 70–99)
Potassium: 4 mmol/L (ref 3.5–5.1)
Sodium: 139 mmol/L (ref 135–145)
Total Bilirubin: 1 mg/dL (ref 0.3–1.2)
Total Protein: 5.8 g/dL — ABNORMAL LOW (ref 6.5–8.1)

## 2023-01-16 LAB — TSH: TSH: 1.609 u[IU]/mL (ref 0.350–4.500)

## 2023-01-16 MED ORDER — HEPARIN SOD (PORK) LOCK FLUSH 100 UNIT/ML IV SOLN
500.0000 [IU] | Freq: Once | INTRAVENOUS | Status: AC | PRN
  Administered 2023-01-16: 500 [IU]
  Filled 2023-01-16: qty 5

## 2023-01-16 MED ORDER — SODIUM CHLORIDE 0.9 % IV SOLN
1500.0000 mg | Freq: Once | INTRAVENOUS | Status: AC
  Administered 2023-01-16: 1500 mg via INTRAVENOUS
  Filled 2023-01-16: qty 30

## 2023-01-16 MED ORDER — SODIUM CHLORIDE 0.9% FLUSH
10.0000 mL | INTRAVENOUS | Status: DC | PRN
  Filled 2023-01-16: qty 10

## 2023-01-16 MED ORDER — SODIUM CHLORIDE 0.9 % IV SOLN
Freq: Once | INTRAVENOUS | Status: AC
  Filled 2023-01-16: qty 250

## 2023-01-16 NOTE — Progress Notes (Signed)
Hematology/Oncology Consult note [facility]  Telephone:([phone] [phone]  Patient Care Team: Ronnald Ramp, MD as PCP - General (Family Medicine) Vanna Scotland, MD as Consulting Physician (Urology) Lamar Blinks, MD as Consulting Physician (Cardiology) Lockie Mola, MD as Referring Physician (Ophthalmology) Deirdre Evener, MD (Dermatology) Glory Buff, RN as Oncology Nurse Navigator Creig Hines, MD as Consulting Physician (Oncology) Salena Saner, MD as Consulting Physician (Pulmonary Disease)   Name of the [name]  [id number]  [birth year]   Date of visit: 01/16/23  Diagnosis- 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  He was found to have a left upper lobe lung mass measuring 5.5 x 3.7 x 2.8 cm.  It was confluent with the left hilar lymph node.  Enlarged AP window lymph node measuring 16 mm.  Enlarged left paratracheal lymph node.  Low-attenuation 7 mm lesion in the liver.  7 mm cyst in the uncinate process of the pancreas for which follow-up was not recommended.  Bilateral kidney cysts.  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  Prostatic enlargement.   PET CT is can showed hypermetabolic left upper lobe lung mass measuring 5.4 x 3.9 cm with an SUV of 15.1.  Ipsilateral mediastinal lymph node metastases with index lymph node measuring 1.7 with an SUV of 9.8.  No evidence of distant metastatic disease.  Biopsy of the left upper lobe as well as station 4R lymph node was consistent with squamous cell carcinoma   NGS testing did not show any actionable mutations.  PD-L1 35%.  MSI  stable.  MRI brain was negative for metastatic disease   Patient completed concurrent chemoradiation with weekly CarboTaxol chemotherapy in December 2023.  Scans following that showed partial response to treatment.  Patient started adjuvant durvalumab in January 2024    Interval history-patient is tolerating treatments well without any significant side effects.  Occasional cough when he drinks cold liquids.  ECOG PS- 1 Pain scale- 0   Review of systems- Review of Systems  Constitutional:  Negative for chills, fever, malaise/fatigue and weight loss.  HENT:  Negative for congestion, ear discharge and nosebleeds.   Eyes:  Negative for blurred vision.  Respiratory:  Negative for cough, hemoptysis, sputum production, shortness of breath and wheezing.   Cardiovascular:  Negative for chest pain, palpitations, orthopnea and claudication.  Gastrointestinal:  Negative for abdominal pain, blood in stool, constipation, diarrhea, heartburn, melena, nausea and vomiting.  Genitourinary:  Negative for dysuria, flank pain, frequency, hematuria and urgency.  Musculoskeletal:  Negative for back pain, joint pain and myalgias.  Skin:  Negative for rash.  Neurological:  Negative for dizziness, tingling, focal weakness, seizures, weakness and headaches.  Endo/Heme/Allergies:  Does not bruise/bleed easily.  Psychiatric/Behavioral:  Negative for depression and suicidal ideas. The patient does not have insomnia.       Allergies  Allergen Reactions   Cefdinir Nausea Only    hypersensitivity to smell   Doxycycline     Sun sensitivity   Prednisone     Hallucinations    Sertraline Other (See Comments)    Hallucinations      Past Medical History:  Diagnosis Date  A-fib (HCC)    a.) CHA2DS2-VASc = 6 (age x2, HTN, DVT x2, vascular disease history). b.) rate/rhythm maintained on oral carvedilol; chronically anticoagulated using daily warfarin.   Actinic keratosis    Anemia    Aortic atherosclerosis  (HCC)    Bilateral renal cysts    a.)  CT abdomen pelvis 04/24/2022: Enhancing septation of the lateral aspect of the mid RIGHT kidney measuring 2.7 x 2.8 cm concerning for cystic renal neoplasm   Bladder calculi    BPH (benign prostatic hyperplasia)    DDD (degenerative disc disease), lumbar    a.) s/p LEFT laminectomy L5   Diverticulosis    DOE (dyspnea on exertion)    DVT (deep venous thrombosis) (HCC)    Elevated PSA    GERD (gastroesophageal reflux disease)    H/O degenerative disc disease    HLD (hyperlipidemia)    Hypertension    Lipoma of arm    Lipomatosis    Long term current use of anticoagulant    a.) warfarin   Lung cancer (HCC)    Mass of upper lobe of left lung 04/24/2022   a.)  CT chest 04/24/2022: Multilobulated perihilar mass measuring 5.3 x 3.7 x 2.8 cm with associated LEFT hilar and mediastinal LAD; b.)  PET CT 05/07/2022: Hypermetabolic LEFT upper lobe lung mass (max SUV 15.1); ipsilateral nodal metastasis --> presuming a NSC histology, 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   NICM (nonischemic cardiomyopathy) (HCC) 11/22/2015   a.) TTE 11/22/2015: EF 40%. b.) TTE 12/27/2018: EF 45%. c.) TTE 08/30/2020: EF 45-50%.   Night sweats    Obesity    Osteoarthritis    RBBB (right bundle branch block)    Right inguinal hernia    VHD (valvular heart disease) 11/22/2015   a.) TTE 11/22/2015: EF 40%; mod MR, mod-sev TR, triv PR; mod BAE, mild RV enlargement. b.) TTE 12/27/2018: EF 45%; mod MR, mod-sev TR, triv PR; mod BAE, mild RV enlargement. c.) TTE 08/30/2020: EF 45-50%; mild MR/TR.     Past Surgical History:  Procedure Laterality Date   Bilateral Radical Keratotomy Bilateral 1997   COLONOSCOPY WITH PROPOFOL N/A 01/11/2016   Procedure: COLONOSCOPY WITH PROPOFOL;  Surgeon: Scot Jun, MD;  Location: [facility];  Service: Endoscopy;  Laterality: N/A; repeat 3 years, tubular adenoma   DENTAL SURGERY     Patient had  implants   FLEXIBLE BRONCHOSCOPY Left 05/19/2022   Procedure: FLEXIBLE BRONCHOSCOPY;  Surgeon: Salena Saner, MD;  Location: [facility];  Service: Cardiopulmonary;  Laterality: Left;   GUM SURGERY     IR IMAGING GUIDED PORT INSERTION  06/13/2022   L4 - S1 decompression Left    PROSTATE BIOPSY     TONSILLECTOMY AND ADENOIDECTOMY  age 47   VIDEO BRONCHOSCOPY WITH ENDOBRONCHIAL ULTRASOUND Left 05/19/2022   Procedure: VIDEO BRONCHOSCOPY WITH ENDOBRONCHIAL ULTRASOUND;  Surgeon: Salena Saner, MD;  Location: [facility];  Service: Cardiopulmonary;  Laterality: Left;   XI ROBOTIC ASSISTED INGUINAL HERNIA REPAIR WITH MESH Right 10/21/2021   Procedure: XI ROBOTIC ASSISTED INGUINAL HERNIA REPAIR WITH MESH;  Surgeon: Campbell Lerner, MD;  Location: [facility];  Service: General;  Laterality: Right;    Social History   Socioeconomic History   Marital status: Married    Spouse name: Doris   Number of children: 3   Years of education: Not on file   Highest education level: Associate degree: occupational, Scientist, product/process development, or vocational program  Occupational History  Occupation: retired  Tobacco Use   Smoking status: Former    Types: Cigarettes    Quit date: 08/26/1971    Years since quitting: 51.4    Passive exposure: Never   Smokeless tobacco: Never   Tobacco comments:    Smoked for about 20 years.  Vaping Use   Vaping Use: Never used  Substance and Sexual Activity   Alcohol use: Not Currently   Drug use: No   Sexual activity: Not Currently  Other Topics Concern   Not on file  Social History Narrative   Not on file   Social Determinants of Health   Financial Resource Strain: Low Risk  (12/23/2022)   Overall Financial Resource Strain (CARDIA)    Difficulty of Paying Living Expenses: Not hard at all  Food Insecurity: No Food Insecurity (12/23/2022)   Hunger Vital Sign    Worried About Running Out of Food in the Last Year: Never true    Ran Out of Food in the Last Year: Never true   Transportation Needs: No Transportation Needs (12/23/2022)   PRAPARE - Administrator, Civil Service (Medical): No    Lack of Transportation (Non-Medical): No  Physical Activity: Sufficiently Active (12/23/2022)   Exercise Vital Sign    Days of Exercise per Week: 7 days    Minutes of Exercise per Session: 50 min  Stress: No Stress Concern Present (12/18/2021)   [employer] of Occupational Health - Occupational Stress Questionnaire    Feeling of Stress : Only a little  Social Connections: Moderately Integrated (12/23/2022)   Social Connection and Isolation Panel [NHANES]    Frequency of Communication with Friends and Family: Twice a week    Frequency of Social Gatherings with Friends and Family: Once a week    Attends Religious Services: More than 4 times per year    Active Member of [employer] or Organizations: No    Attends Banker Meetings: Never    Marital Status: Married  Catering manager Violence: Not At Risk (12/23/2022)   Humiliation, Afraid, Rape, and Kick questionnaire    Fear of Current or Ex-Partner: No    Emotionally Abused: No    Physically Abused: No    Sexually Abused: No    Family History  Problem Relation Age of Onset   Cancer Mother        secondary to cancinoma of the jaw from her dipping snuff.   Heart attack Father    CAD Father    Hypertension Sister    Diabetes Son        Type I diabetes   Prostate cancer Neg Hx    Kidney cancer Neg Hx      Current Outpatient Medications:    amLODipine (NORVASC) 5 MG tablet, TAKE 1 TABLET (5 MG TOTAL) BY MOUTH DAILY., Disp: 90 tablet, Rfl: 3   benazepril (LOTENSIN) 40 MG tablet, Take 1 tablet (40 mg total) by mouth daily., Disp: 90 tablet, Rfl: 1   carvedilol (COREG) 6.25 MG tablet, TAKE 1 TABLET (6.25 MG TOTAL) BY MOUTH 2 (TWO) TIMES DAILY., Disp: 180 tablet, Rfl: 3   Cyanocobalamin (VITAMIN B 12 PO), Take 1 capsule by mouth 2 (two) times daily. 1000 mcg each capsule, Disp: , Rfl:     doxazosin (CARDURA) 8 MG tablet, TAKE 1 TABLET (8 MG TOTAL) BY MOUTH DAILY., Disp: 90 tablet, Rfl: 3   finasteride (PROSCAR) 5 MG tablet, Take 1 tablet (5 mg total) by mouth daily., Disp: 90 tablet, Rfl: 3  gabapentin (NEURONTIN) 300 MG capsule, TAKE 1 CAPSULE EVERY MORNING, 1 CAPSULE IN THE AFTERNOON AND 2 CAPSULES AT BEDTIME, Disp: 360 capsule, Rfl: 2   Magnesium 500 MG TABS, Take 500 mg by mouth daily., Disp: , Rfl:    Multiple Vitamin (MULTI-VITAMIN) tablet, Take 1 tablet by mouth daily. , Disp: , Rfl:    omeprazole (PRILOSEC) 20 MG capsule, Take 20 mg by mouth daily., Disp: , Rfl:    oxyCODONE (OXY IR/ROXICODONE) 5 MG immediate release tablet, Take 1 tablet (5 mg total) by mouth every 6 (six) hours as needed for severe pain., Disp: 90 tablet, Rfl: 0   triamterene-hydrochlorothiazide (MAXZIDE-25) 37.5-25 MG tablet, TAKE 1/2 TABLET EVERY DAY, Disp: 45 tablet, Rfl: 1   warfarin (COUMADIN) 5 MG tablet, Take 1 tablet (5 mg total) by mouth daily., Disp: 90 tablet, Rfl: 1 No current facility-administered medications for this visit.  Facility-Administered Medications Ordered in Other Visits:    heparin lock flush 100 UNIT/ML injection, , , ,   Physical exam:  Vitals:   01/16/23 0924  BP: (!) 133/58  Pulse: 68  Temp: (!) 96.2 F (35.7 C)  TempSrc: Tympanic  SpO2: 100%  Weight: 155 lb 11.2 oz (70.6 kg)  Height: 5\' 9"  (1.753 m)   Physical Exam Cardiovascular:     Rate and Rhythm: Normal rate and regular rhythm.     Heart sounds: Normal heart sounds.  Pulmonary:     Effort: Pulmonary effort is normal.     Breath sounds: Normal breath sounds.  Abdominal:     General: Bowel sounds are normal.     Palpations: Abdomen is soft.  Skin:    General: Skin is warm and dry.  Neurological:     Mental Status: He is alert and oriented to person, place, and time.         Latest Ref Rng & Units 01/16/2023    9:06 AM  CMP  Glucose 70 - 99 mg/dL 161   BUN 8 - 23 mg/dL 31   Creatinine 0.96 -  1.24 mg/dL 0.45   Sodium 409 - 811 mmol/L 139   Potassium 3.5 - 5.1 mmol/L 4.0   Chloride 98 - 111 mmol/L 107   CO2 22 - 32 mmol/L 25   Calcium 8.9 - 10.3 mg/dL 8.6   Total Protein 6.5 - 8.1 g/dL 5.8   Total Bilirubin 0.3 - 1.2 mg/dL 1.0   Alkaline Phos 38 - 126 U/L 55   AST 15 - 41 U/L 26   ALT 0 - 44 U/L 16       Latest Ref Rng & Units 01/16/2023    9:06 AM  CBC  WBC 4.0 - 10.5 K/uL 3.4   Hemoglobin 13.0 - 17.0 g/dL 91.4   Hematocrit 78.2 - 52.0 % 31.3   Platelets 150 - 400 K/uL 115       No results found.   Assessment and plan- Patient is a 88 y.o. male with history of stage III squamous cell carcinoma of the left upper lobe T3 N2 M0 s/p concurrent weekly CarboTaxol with radiation.  He had partial response to treatment.  He is here for on treatment assessment prior to cycle 6 of adjuvant durvalumab  Counts okay to proceed with cycle 6 of adjuvant durvalumab today.  He will be seen by covering provider in 4 weeks for cycle 7.  I will see him back in 8 weeks for cycle 8.  Plan to repeat scans after 8 cycles   Visit  Diagnosis 1. Encounter for antineoplastic immunotherapy   2. Primary malignant neoplasm of left upper lobe of lung (HCC)      Dr. Owens Shark, MD, MPH [facility] at [facility] [phone] 01/16/2023 9:41 AM

## 2023-01-16 NOTE — Patient Instructions (Signed)
[facility] [place] [facility]  Discharge Instructions: Thank you for choosing [facility] to provide your oncology and hematology care.  If you have a lab appointment with the Cancer Center, please go directly to the Cancer Center and check in at the registration area.  Wear comfortable clothing and clothing appropriate for easy access to any Portacath or PICC line.   We strive to give you quality time with your provider. You may need to reschedule your appointment if you arrive late (15 or more minutes).  Arriving late affects you and other patients whose appointments are after yours.  Also, if you miss three or more appointments without notifying the office, you may be dismissed from the clinic at the provider's discretion.      For prescription refill requests, have your pharmacy contact our office and allow 72 hours for refills to be completed.    Today you received the following chemotherapy and/or immunotherapy agents- Durvalumab      To help prevent nausea and vomiting after your treatment, we encourage you to take your nausea medication as directed.  BELOW ARE SYMPTOMS THAT SHOULD BE REPORTED IMMEDIATELY: *FEVER GREATER THAN 100.4 F (38 C) OR HIGHER *CHILLS OR SWEATING *NAUSEA AND VOMITING THAT IS NOT CONTROLLED WITH YOUR NAUSEA MEDICATION *UNUSUAL SHORTNESS OF BREATH *UNUSUAL BRUISING OR BLEEDING *URINARY PROBLEMS (pain or burning when urinating, or frequent urination) *BOWEL PROBLEMS (unusual diarrhea, constipation, pain near the anus) TENDERNESS IN MOUTH AND THROAT WITH OR WITHOUT PRESENCE OF ULCERS (sore throat, sores in mouth, or a toothache) UNUSUAL RASH, SWELLING OR PAIN  UNUSUAL VAGINAL DISCHARGE OR ITCHING   Items with * indicate a potential emergency and should be followed up as soon as possible or go to the Emergency Department if any problems should occur.  Please show the CHEMOTHERAPY ALERT CARD or IMMUNOTHERAPY ALERT CARD at check-in  to the Emergency Department and triage nurse.  Should you have questions after your visit or need to cancel or reschedule your appointment, please contact [facility] [place] [facility]  [phone] and follow the prompts.  Office hours are 8:00 a.m. to 4:30 p.m. Monday - Friday. Please note that voicemails left after 4:00 p.m. may not be returned until the following business day.  We are closed weekends and major holidays. You have access to a nurse at all times for urgent questions. Please call the main number to the clinic [phone] and follow the prompts.  For any non-urgent questions, you may also contact your provider using MyChart. We now offer e-Visits for anyone 18 and older to request care online for non-urgent symptoms. For details visit [email or web address].   Also download the MyChart app! Go to the app store, search "MyChart", open the app, select , and log in with your MyChart username and password.    

## 2023-01-17 LAB — T4: T4, Total: 7.7 ug/dL (ref 4.5–12.0)

## 2023-01-23 ENCOUNTER — Other Ambulatory Visit: Payer: Medicare HMO

## 2023-01-29 ENCOUNTER — Ambulatory Visit: Payer: Medicare HMO | Admitting: Radiation Oncology

## 2023-02-02 ENCOUNTER — Ambulatory Visit
Admission: RE | Admit: 2023-02-02 | Discharge: 2023-02-02 | Disposition: A | Discharge status: Home / Self Care | Payer: Medicare HMO | Source: Ambulatory Visit | Attending: Radiation Oncology | Admitting: Radiation Oncology

## 2023-02-02 ENCOUNTER — Encounter: Payer: Self-pay | Admitting: Radiation Oncology

## 2023-02-02 VITALS — BP 158/84 | HR 61 | Temp 95.9°F | Resp 14 | Ht 69.0 in | Wt 157.0 lb

## 2023-02-02 DIAGNOSIS — Z923 Personal history of irradiation: Secondary | ICD-10-CM | POA: Diagnosis not present

## 2023-02-02 DIAGNOSIS — C3412 Malignant neoplasm of upper lobe, left bronchus or lung: Secondary | ICD-10-CM | POA: Diagnosis not present

## 2023-02-02 DIAGNOSIS — Z87891 Personal history of nicotine dependence: Secondary | ICD-10-CM | POA: Diagnosis not present

## 2023-02-02 DIAGNOSIS — R918 Other nonspecific abnormal finding of lung field: Secondary | ICD-10-CM

## 2023-02-02 NOTE — Progress Notes (Signed)
Radiation Oncology Follow up Note  Name: Casey Gallegos   Date:   02/02/2023 MRN:  [id number] DOB: 01-12-1935    This 88 y.o. male presents to the clinic today for 34-month follow-up status post concurrent chemoradiation therapy for stage IIIb (T3 N2 M0) squamous cell carcinoma of the left lung.  REFERRING PROVIDER: Brett Albino*  HPI: Patient is a 88 year old male now out 5 months having completed concurrent chemoradiation therapy for stage IIIb squamous cell carcinoma left upper lobe.  Seen today in routine follow-up he is doing well.  He specifically [place] cough hemoptysis or chest tightness.  He is currently on maintenance Durvalumab.  He does claim he has some catching in his throat not really dysphagia not really sure the etiology of that.  He had a CT scan back in March showing left upper lobe mass similar in size with changes consistent with radiation scarring.  Diminished size of previous enlarged AP window nodes consistent with treatment response.  COMPLICATIONS OF TREATMENT: none  FOLLOW UP COMPLIANCE: keeps appointments   PHYSICAL EXAM:  BP (!) 158/84   Pulse 61   Temp (!) 95.9 F (35.5 C)   Resp 14   Ht 5\' 9"  (1.753 m)   Wt 157 lb (71.2 kg)   BMI 23.18 kg/m  Well-developed well-nourished patient in NAD. HEENT reveals PERLA, EOMI, discs not visualized.  Oral cavity is clear. No oral mucosal lesions are identified. Neck is clear without evidence of cervical or supraclavicular adenopathy. Lungs are clear to A&P. Cardiac examination is essentially unremarkable with regular rate and rhythm without murmur rub or thrill. Abdomen is benign with no organomegaly or masses noted. Motor sensory and DTR levels are equal and symmetric in the upper and lower extremities. Cranial nerves II through XII are grossly intact. Proprioception is intact. No peripheral adenopathy or edema is identified. No motor or sensory levels are noted. Crude visual fields are within normal  range.  RADIOLOGY RESULTS: CT scan reviewed compatible with above-stated findings  PLAN: Present time patient is doing well.  Changes consistent with radiation to his chest.  I see no evidence of progression of disease.  He continues on maintenance durvalumab which he is tolerating well.  I will see him back in 6 months for follow-up.  Patient is to call with any concerns.  I would like to take this opportunity to thank you for allowing me to participate in the care of your patient.Carmina Miller, MD

## 2023-02-04 ENCOUNTER — Ambulatory Visit (INDEPENDENT_AMBULATORY_CARE_PROVIDER_SITE_OTHER): Payer: Medicare HMO

## 2023-02-04 DIAGNOSIS — I480 Paroxysmal atrial fibrillation: Secondary | ICD-10-CM | POA: Diagnosis not present

## 2023-02-04 LAB — POCT INR
INR: 1.9 — AB (ref 2.0–3.0)
POC INR: 1.9
PT: 23.1

## 2023-02-04 NOTE — Patient Instructions (Signed)
Description   5 mg daily except 4 mg on M - F. F/ U 3 weeks

## 2023-02-12 ENCOUNTER — Other Ambulatory Visit: Payer: Self-pay | Admitting: Urology

## 2023-02-12 DIAGNOSIS — N138 Other obstructive and reflux uropathy: Secondary | ICD-10-CM

## 2023-02-13 ENCOUNTER — Inpatient Hospital Stay (HOSPITAL_BASED_OUTPATIENT_CLINIC_OR_DEPARTMENT_OTHER): Payer: Medicare HMO | Admitting: Nurse Practitioner

## 2023-02-13 ENCOUNTER — Inpatient Hospital Stay: Payer: Medicare HMO

## 2023-02-13 ENCOUNTER — Inpatient Hospital Stay: Payer: Medicare HMO | Attending: Oncology

## 2023-02-13 ENCOUNTER — Encounter: Payer: Self-pay | Admitting: Nurse Practitioner

## 2023-02-13 VITALS — BP 145/94 | HR 102 | Temp 98.0°F | Wt 154.0 lb

## 2023-02-13 DIAGNOSIS — Z5112 Encounter for antineoplastic immunotherapy: Secondary | ICD-10-CM | POA: Insufficient documentation

## 2023-02-13 DIAGNOSIS — C3412 Malignant neoplasm of upper lobe, left bronchus or lung: Secondary | ICD-10-CM

## 2023-02-13 DIAGNOSIS — C771 Secondary and unspecified malignant neoplasm of intrathoracic lymph nodes: Secondary | ICD-10-CM | POA: Diagnosis not present

## 2023-02-13 LAB — COMPREHENSIVE METABOLIC PANEL
ALT: 16 U/L (ref 0–44)
AST: 20 U/L (ref 15–41)
Albumin: 3.6 g/dL (ref 3.5–5.0)
Alkaline Phosphatase: 49 U/L (ref 38–126)
Anion gap: 7 (ref 5–15)
BUN: 23 mg/dL (ref 8–23)
CO2: 24 mmol/L (ref 22–32)
Calcium: 8.6 mg/dL — ABNORMAL LOW (ref 8.9–10.3)
Chloride: 108 mmol/L (ref 98–111)
Creatinine, Ser: 0.99 mg/dL (ref 0.61–1.24)
GFR, Estimated: >60 mL/min (ref >60)
Glucose, Bld: 93 mg/dL (ref 70–99)
Potassium: 4.2 mmol/L (ref 3.5–5.1)
Sodium: 139 mmol/L (ref 135–145)
Total Bilirubin: 1.2 mg/dL (ref 0.3–1.2)
Total Protein: 5.9 g/dL — ABNORMAL LOW (ref 6.5–8.1)

## 2023-02-13 LAB — CBC WITH DIFFERENTIAL/PLATELET
Abs Immature Granulocytes: 0 10*3/uL (ref 0.00–0.07)
Basophils Absolute: 0 10*3/uL (ref 0.0–0.1)
Basophils Relative: 1 %
Eosinophils Absolute: 0.2 10*3/uL (ref 0.0–0.5)
Eosinophils Relative: 5 %
HCT: 30.5 % — ABNORMAL LOW (ref 39.0–52.0)
Hemoglobin: 10.3 g/dL — ABNORMAL LOW (ref 13.0–17.0)
Immature Granulocytes: 0 %
Lymphocytes Relative: 19 %
Lymphs Abs: 0.6 10*3/uL — ABNORMAL LOW (ref 0.7–4.0)
MCH: 33.7 pg (ref 26.0–34.0)
MCHC: 33.8 g/dL (ref 30.0–36.0)
MCV: 99.7 fL (ref 80.0–100.0)
Monocytes Absolute: 0.4 10*3/uL (ref 0.1–1.0)
Monocytes Relative: 13 %
Neutro Abs: 2 10*3/uL (ref 1.7–7.7)
Neutrophils Relative %: 62 %
Platelets: 115 10*3/uL — ABNORMAL LOW (ref 150–400)
RBC: 3.06 MIL/uL — ABNORMAL LOW (ref 4.22–5.81)
RDW: 14.8 % (ref 11.5–15.5)
WBC: 3.2 10*3/uL — ABNORMAL LOW (ref 4.0–10.5)
nRBC: 0 % (ref 0.0–0.2)

## 2023-02-13 MED ORDER — SODIUM CHLORIDE 0.9 % IV SOLN
Freq: Once | INTRAVENOUS | Status: AC
  Filled 2023-02-13: qty 250

## 2023-02-13 MED ORDER — HEPARIN SOD (PORK) LOCK FLUSH 100 UNIT/ML IV SOLN
500.0000 [IU] | Freq: Once | INTRAVENOUS | Status: AC | PRN
  Administered 2023-02-13: 500 [IU]
  Filled 2023-02-13: qty 5

## 2023-02-13 MED ORDER — SODIUM CHLORIDE 0.9 % IV SOLN
1500.0000 mg | Freq: Once | INTRAVENOUS | Status: AC
  Administered 2023-02-13: 1500 mg via INTRAVENOUS
  Filled 2023-02-13: qty 30

## 2023-02-13 NOTE — Progress Notes (Signed)
Hematology/Oncology Consult Note [facility]  [phone] [phone]  Patient Care Team: Ronnald Ramp, MD as PCP - General (Family Medicine) Vanna Scotland, MD as Consulting Physician (Urology) Lamar Blinks, MD as Consulting Physician (Cardiology) Lockie Mola, MD as Referring Physician (Ophthalmology) Deirdre Evener, MD (Dermatology) Glory Buff, RN as Oncology Nurse Navigator Creig Hines, MD as Consulting Physician (Oncology) Salena Saner, MD as Consulting Physician (Pulmonary Disease)   Name of the [name]  [id number]  1934-12-09   Date of visit: 02/13/23  Diagnosis- 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  He was found to have a left upper lobe lung mass measuring 5.5 x 3.7 x 2.8 cm.  It was confluent with the left hilar lymph node.  Enlarged AP window lymph node measuring 16 mm.  Enlarged left paratracheal lymph node.  Low-attenuation 7 mm lesion in the liver.  7 mm cyst in the uncinate process of the pancreas for which follow-up was not recommended.  Bilateral kidney cysts.  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  Prostatic enlargement.   PET CT is can showed hypermetabolic left upper lobe lung mass measuring 5.4 x 3.9 cm with an SUV of 15.1.  Ipsilateral mediastinal lymph node metastases with index lymph node measuring 1.7 with an SUV of 9.8.  No evidence of distant metastatic disease.  Biopsy of the left upper lobe as well as station 4R lymph node was consistent with squamous cell carcinoma   NGS testing did not show any actionable mutations.  PD-L1 35%.  MSI  stable.  MRI brain was negative for metastatic disease   Patient completed concurrent chemoradiation with weekly CarboTaxol chemotherapy in December 2023.  Scans following that showed partial response to treatment.  Patient started adjuvant durvalumab in January 2024    Interval history-patient is tolerating treatments well without any significant side effects.  Some globus sensation which is unchanged and mild. Brought on by cold liquids and not worsening.   ECOG PS- 1 Pain scale- 0   Review of systems- Review of Systems  Constitutional:  Negative for chills, fever, malaise/fatigue and weight loss.  HENT:  Negative for congestion, ear discharge and nosebleeds.   Eyes:  Negative for blurred vision.  Respiratory:  Negative for cough, hemoptysis, sputum production, shortness of breath and wheezing.   Cardiovascular:  Negative for chest pain, palpitations, orthopnea and claudication.  Gastrointestinal:  Negative for abdominal pain, blood in stool, constipation, diarrhea, heartburn, melena, nausea and vomiting.  Genitourinary:  Negative for dysuria, flank pain, frequency, hematuria and urgency.  Musculoskeletal:  Negative for back pain, joint pain and myalgias.  Skin:  Negative for rash.  Neurological:  Negative for dizziness, tingling, focal weakness, seizures, weakness and headaches.  Endo/Heme/Allergies:  Does not bruise/bleed easily.  Psychiatric/Behavioral:  Negative for depression and suicidal ideas. The patient does not have insomnia.       Allergies  Allergen Reactions   Cefdinir Nausea Only    hypersensitivity to smell   Doxycycline     Sun sensitivity   Prednisone     Hallucinations    Sertraline Other (See Comments)    Hallucinations  Past Medical History:  Diagnosis Date   A-fib [facility])    a.) CHA2DS2-VASc = 6 (age x2, HTN, DVT x2, vascular disease history). b.) rate/rhythm maintained on oral carvedilol; chronically anticoagulated using daily warfarin.   Actinic  keratosis    Anemia    Aortic atherosclerosis (HCC)    Bilateral renal cysts    a.)  CT abdomen pelvis 04/24/2022: Enhancing septation of the lateral aspect of the mid RIGHT kidney measuring 2.7 x 2.8 cm concerning for cystic renal neoplasm   Bladder calculi    BPH (benign prostatic hyperplasia)    DDD (degenerative disc disease), lumbar    a.) s/p LEFT laminectomy L5   Diverticulosis    DOE (dyspnea on exertion)    DVT (deep venous thrombosis) (HCC)    Elevated PSA    GERD (gastroesophageal reflux disease)    H/O degenerative disc disease    HLD (hyperlipidemia)    Hypertension    Lipoma of arm    Lipomatosis    Long term current use of anticoagulant    a.) warfarin   Lung cancer (HCC)    Mass of upper lobe of left lung 04/24/2022   a.)  CT chest 04/24/2022: Multilobulated perihilar mass measuring 5.3 x 3.7 x 2.8 cm with associated LEFT hilar and mediastinal LAD; b.)  PET CT 05/07/2022: Hypermetabolic LEFT upper lobe lung mass (max SUV 15.1); ipsilateral nodal metastasis --> presuming a NSC histology, 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   NICM (nonischemic cardiomyopathy) (HCC) 11/22/2015   a.) TTE 11/22/2015: EF 40%. b.) TTE 12/27/2018: EF 45%. c.) TTE 08/30/2020: EF 45-50%.   Night sweats    Obesity    Osteoarthritis    RBBB (right bundle branch block)    Right inguinal hernia    VHD (valvular heart disease) 11/22/2015   a.) TTE 11/22/2015: EF 40%; mod MR, mod-sev TR, triv PR; mod BAE, mild RV enlargement. b.) TTE 12/27/2018: EF 45%; mod MR, mod-sev TR, triv PR; mod BAE, mild RV enlargement. c.) TTE 08/30/2020: EF 45-50%; mild MR/TR.     Past Surgical History:  Procedure Laterality Date   Bilateral Radical Keratotomy Bilateral 1997   COLONOSCOPY WITH PROPOFOL N/A 01/11/2016   Procedure: COLONOSCOPY WITH PROPOFOL;  Surgeon: Scot Jun, MD;  Location: [facility] ENDOSCOPY;  Service: Endoscopy;  Laterality: N/A; repeat 3 years,  tubular adenoma   DENTAL SURGERY     Patient had implants   FLEXIBLE BRONCHOSCOPY Left 05/19/2022   Procedure: FLEXIBLE BRONCHOSCOPY;  Surgeon: Salena Saner, MD;  Location: [facility] ORS;  Service: Cardiopulmonary;  Laterality: Left;   GUM SURGERY     IR IMAGING GUIDED PORT INSERTION  06/13/2022   L4 - S1 decompression Left    PROSTATE BIOPSY     TONSILLECTOMY AND ADENOIDECTOMY  age 89   VIDEO BRONCHOSCOPY WITH ENDOBRONCHIAL ULTRASOUND Left 05/19/2022   Procedure: VIDEO BRONCHOSCOPY WITH ENDOBRONCHIAL ULTRASOUND;  Surgeon: Salena Saner, MD;  Location: [facility] ORS;  Service: Cardiopulmonary;  Laterality: Left;   XI ROBOTIC ASSISTED INGUINAL HERNIA REPAIR WITH MESH Right 10/21/2021   Procedure: XI ROBOTIC ASSISTED INGUINAL HERNIA REPAIR WITH MESH;  Surgeon: Campbell Lerner, MD;  Location: [facility] ORS;  Service: General;  Laterality: Right;    Social History   Socioeconomic History   Marital status: Married    Spouse name: Doris   Number of children: 3   Years of education: Not on file   Highest education level: Associate degree: occupational, Scientist, product/process development,  or vocational program  Occupational History   Occupation: retired  Tobacco Use   Smoking status: Former    Types: Cigarettes    Quit date: 08/26/1971    Years since quitting: 51.5    Passive exposure: Never   Smokeless tobacco: Never   Tobacco comments:    Smoked for about 20 years.  Vaping Use   Vaping Use: Never used  Substance and Sexual Activity   Alcohol use: Not Currently   Drug use: No   Sexual activity: Not Currently  Other Topics Concern   Not on file  Social History Narrative   Not on file   Social Determinants of Health   Financial Resource Strain: Low Risk  (12/23/2022)   Overall Financial Resource Strain (CARDIA)    Difficulty of Paying Living Expenses: Not hard at all  Food Insecurity: No Food Insecurity (12/23/2022)   Hunger Vital Sign    Worried About Running Out of Food in the Last Year: Never true     Ran Out of Food in the Last Year: Never true  Transportation Needs: No Transportation Needs (12/23/2022)   PRAPARE - Administrator, Civil Service (Medical): No    Lack of Transportation (Non-Medical): No  Physical Activity: Sufficiently Active (12/23/2022)   Exercise Vital Sign    Days of Exercise per Week: 7 days    Minutes of Exercise per Session: 50 min  Stress: No Stress Concern Present (12/18/2021)   [employer] of Occupational Health - Occupational Stress Questionnaire    Feeling of Stress : Only a little  Social Connections: Moderately Integrated (12/23/2022)   Social Connection and Isolation Panel [NHANES]    Frequency of Communication with Friends and Family: Twice a week    Frequency of Social Gatherings with Friends and Family: Once a week    Attends Religious Services: More than 4 times per year    Active Member of [employer] or Organizations: No    Attends Banker Meetings: Never    Marital Status: Married  Catering manager Violence: Not At Risk (12/23/2022)   Humiliation, Afraid, Rape, and Kick questionnaire    Fear of Current or Ex-Partner: No    Emotionally Abused: No    Physically Abused: No    Sexually Abused: No    Family History  Problem Relation Age of Onset   Cancer Mother        secondary to cancinoma of the jaw from her dipping snuff.   Heart attack Father    CAD Father    Hypertension Sister    Diabetes Son        Type I diabetes   Prostate cancer Neg Hx    Kidney cancer Neg Hx      Current Outpatient Medications:    amLODipine (NORVASC) 5 MG tablet, TAKE 1 TABLET (5 MG TOTAL) BY MOUTH DAILY., Disp: 90 tablet, Rfl: 3   benazepril (LOTENSIN) 40 MG tablet, Take 1 tablet (40 mg total) by mouth daily., Disp: 90 tablet, Rfl: 1   carvedilol (COREG) 6.25 MG tablet, TAKE 1 TABLET (6.25 MG TOTAL) BY MOUTH 2 (TWO) TIMES DAILY., Disp: 180 tablet, Rfl: 3   Cyanocobalamin (VITAMIN B 12 PO), Take 1 capsule by mouth 2 (two) times daily.  1000 mcg each capsule, Disp: , Rfl:    doxazosin (CARDURA) 8 MG tablet, TAKE 1 TABLET (8 MG TOTAL) BY MOUTH DAILY., Disp: 90 tablet, Rfl: 3   finasteride (PROSCAR) 5 MG tablet, TAKE 1 TABLET EVERY DAY, Disp:  90 tablet, Rfl: 3   gabapentin (NEURONTIN) 300 MG capsule, TAKE 1 CAPSULE EVERY MORNING, 1 CAPSULE IN THE AFTERNOON AND 2 CAPSULES AT BEDTIME, Disp: 360 capsule, Rfl: 2   Magnesium 500 MG TABS, Take 500 mg by mouth daily., Disp: , Rfl:    Multiple Vitamin (MULTI-VITAMIN) tablet, Take 1 tablet by mouth daily. , Disp: , Rfl:    omeprazole (PRILOSEC) 20 MG capsule, Take 20 mg by mouth daily., Disp: , Rfl:    oxyCODONE (OXY IR/ROXICODONE) 5 MG immediate release tablet, Take 1 tablet (5 mg total) by mouth every 6 (six) hours as needed for severe pain., Disp: 90 tablet, Rfl: 0   triamterene-hydrochlorothiazide (MAXZIDE-25) 37.5-25 MG tablet, TAKE 1/2 TABLET EVERY DAY, Disp: 45 tablet, Rfl: 1   warfarin (COUMADIN) 5 MG tablet, Take 1 tablet (5 mg total) by mouth daily., Disp: 90 tablet, Rfl: 1 No current facility-administered medications for this visit.  Facility-Administered Medications Ordered in Other Visits:    heparin lock flush 100 UNIT/ML injection, , , ,   Physical exam:  Vitals:   02/13/23 1304  BP: (!) 145/94  Pulse: (!) 102  Temp: 98 F (36.7 C)  TempSrc: Tympanic  SpO2: 99%  Weight: 154 lb (69.9 kg)   Physical Exam Vitals reviewed.  Constitutional:      Appearance: He is not ill-appearing.  Cardiovascular:     Rate and Rhythm: Normal rate and regular rhythm.  Pulmonary:     Effort: Pulmonary effort is normal.     Breath sounds: Normal breath sounds.  Abdominal:     General: There is no distension.     Tenderness: There is no abdominal tenderness.  Skin:    General: Skin is warm and dry.  Neurological:     Mental Status: He is alert and oriented to person, place, and time.  Psychiatric:        Mood and Affect: Mood normal.         Latest Ref Rng & Units  02/13/2023   12:43 PM  CMP  Glucose 70 - 99 mg/dL 93   BUN 8 - 23 mg/dL 23   Creatinine 1.61 - 1.24 mg/dL 0.96   Sodium 045 - 409 mmol/L 139   Potassium 3.5 - 5.1 mmol/L 4.2   Chloride 98 - 111 mmol/L 108   CO2 22 - 32 mmol/L 24   Calcium 8.9 - 10.3 mg/dL 8.6   Total Protein 6.5 - 8.1 g/dL 5.9   Total Bilirubin 0.3 - 1.2 mg/dL 1.2   Alkaline Phos 38 - 126 U/L 49   AST 15 - 41 U/L 20   ALT 0 - 44 U/L 16       Latest Ref Rng & Units 02/13/2023   12:43 PM  CBC  WBC 4.0 - 10.5 K/uL 3.2   Hemoglobin 13.0 - 17.0 g/dL 81.1   Hematocrit 91.4 - 52.0 % 30.5   Platelets 150 - 400 K/uL 115       No results found.   Assessment and plan- Patient is a 88 y.o. male with history of stage III squamous cell carcinoma of the left upper lobe T3 N2 M0 s/p concurrent weekly CarboTaxol with radiation.  He had partial response to treatment.  He is here for on treatment assessment prior to cycle 7 of adjuvant durvalumab. Tolerating well.   Counts okay to proceed with cycle 7 of adjuvant durvalumab today.  He will see Dr Smith Robert back in 4 weeks for cycle 8.  Plan to  repeat scans after 8 cycles   Visit Diagnosis 1. Primary malignant neoplasm of left upper lobe of lung (HCC)    Consuello Masse, DNP, AGNP-C, AOCNP [facility] [phone] (clinic) 02/13/2023

## 2023-02-13 NOTE — Patient Instructions (Signed)
[facility]  Discharge Instructions: Thank you for choosing [facility] to provide your oncology and hematology care.  If you have a lab appointment with the Cancer Center, please go directly to the Cancer Center and check in at the registration area.  Wear comfortable clothing and clothing appropriate for easy access to any Portacath or PICC line.   We strive to give you quality time with your provider. You may need to reschedule your appointment if you arrive late (15 or more minutes).  Arriving late affects you and other patients whose appointments are after yours.  Also, if you miss three or more appointments without notifying the office, you may be dismissed from the clinic at the provider's discretion.      For prescription refill requests, have your pharmacy contact our office and allow 72 hours for refills to be completed.    Today you received the following chemotherapy and/or immunotherapy agents IMFINZI      To help prevent nausea and vomiting after your treatment, we encourage you to take your nausea medication as directed.  BELOW ARE SYMPTOMS THAT SHOULD BE REPORTED IMMEDIATELY: *FEVER GREATER THAN 100.4 F (38 C) OR HIGHER *CHILLS OR SWEATING *NAUSEA AND VOMITING THAT IS NOT CONTROLLED WITH YOUR NAUSEA MEDICATION *UNUSUAL SHORTNESS OF BREATH *UNUSUAL BRUISING OR BLEEDING *URINARY PROBLEMS (pain or burning when urinating, or frequent urination) *BOWEL PROBLEMS (unusual diarrhea, constipation, pain near the anus) TENDERNESS IN MOUTH AND THROAT WITH OR WITHOUT PRESENCE OF ULCERS (sore throat, sores in mouth, or a toothache) UNUSUAL RASH, SWELLING OR PAIN  UNUSUAL VAGINAL DISCHARGE OR ITCHING   Items with * indicate a potential emergency and should be followed up as soon as possible or go to the Emergency Department if any problems should occur.  Please show the CHEMOTHERAPY ALERT CARD or IMMUNOTHERAPY ALERT CARD at check-in to  the Emergency Department and triage nurse.  Should you have questions after your visit or need to cancel or reschedule your appointment, please contact [facility] [place] [facility]  [phone] and follow the prompts.  Office hours are 8:00 a.m. to 4:30 p.m. Monday - Friday. Please note that voicemails left after 4:00 p.m. may not be returned until the following business day.  We are closed weekends and major holidays. You have access to a nurse at all times for urgent questions. Please call the main number to the clinic [phone] and follow the prompts.  For any non-urgent questions, you may also contact your provider using MyChart. We now offer e-Visits for anyone 18 and older to request care online for non-urgent symptoms. For details visit [email or web address].   Also download the MyChart app! Go to the app store, search "MyChart", open the app, select [facility], and log in with your MyChart username and password.  Durvalumab Injection What is this medication? DURVALUMAB (dur VAL ue mab) treats some types of cancer. It works by helping your immune system slow or stop the spread of cancer cells. It is a monoclonal antibody. This medicine may be used for other purposes; ask your health care provider or pharmacist if you have questions. COMMON BRAND NAME(S): IMFINZI What should I tell my care team before I take this medication? They need to know if you have any of these conditions: Allogeneic stem cell transplant (uses someone else's stem cells) Autoimmune diseases, such as Crohn disease, ulcerative colitis, lupus History of chest radiation Nervous system problems, such as Guillain-Barre syndrome, myasthenia gravis Organ   transplant An unusual or allergic reaction to durvalumab, other medications, foods, dyes, or preservatives Pregnant or trying to get pregnant Breast-feeding How should I use this medication? This medication is infused into a vein. It is given by  your care team in a hospital or clinic setting. A special MedGuide will be given to you before each treatment. Be sure to read this information carefully each time. Talk to your care team about the use of this medication in children. Special care may be needed. Overdosage: If you think you have taken too much of this medicine contact a poison control center or emergency room at once. NOTE: This medicine is only for you. Do not share this medicine with others. What if I miss a dose? Keep appointments for follow-up doses. It is important not to miss your dose. Call your care team if you are unable to keep an appointment. What may interact with this medication? Interactions have not been studied. This list may not describe all possible interactions. Give your health care provider a list of all the medicines, herbs, non-prescription drugs, or dietary supplements you use. Also tell them if you smoke, drink alcohol, or use illegal drugs. Some items may interact with your medicine. What should I watch for while using this medication? Your condition will be monitored carefully while you are receiving this medication. You may need blood work while taking this medication. This medication may cause serious skin reactions. They can happen weeks to months after starting the medication. Contact your care team right away if you notice fevers or flu-like symptoms with a rash. The rash may be red or purple and then turn into blisters or peeling of the skin. You may also notice a red rash with swelling of the face, lips, or lymph nodes in your neck or under your arms. Tell your care team right away if you have any change in your eyesight. Talk to your care team if you may be pregnant. Serious birth defects can occur if you take this medication during pregnancy and for 3 months after the last dose. You will need a negative pregnancy test before starting this medication. Contraception is recommended while taking this  medication and for 3 months after the last dose. Your care team can help you find the option that works for you. Do not breastfeed while taking this medication and for 3 months after the last dose. What side effects may I notice from receiving this medication? Side effects that you should report to your care team as soon as possible: Allergic reactions--skin rash, itching, hives, swelling of the face, lips, tongue, or throat Dry cough, shortness of breath or trouble breathing Eye pain, redness, irritation, or discharge with blurry or decreased vision Heart muscle inflammation--unusual weakness or fatigue, shortness of breath, chest pain, fast or irregular heartbeat, dizziness, swelling of the ankles, feet, or hands Hormone gland problems--headache, sensitivity to light, unusual weakness or fatigue, dizziness, fast or irregular heartbeat, increased sensitivity to cold or heat, excessive sweating, constipation, hair loss, increased thirst or amount of urine, tremors or shaking, irritability Infusion reactions--chest pain, shortness of breath or trouble breathing, feeling faint or lightheaded Kidney injury (glomerulonephritis)--decrease in the amount of urine, red or dark brown urine, foamy or bubbly urine, swelling of the ankles, hands, or feet Liver injury--right upper belly pain, loss of appetite, nausea, light-colored stool, dark yellow or brown urine, yellowing skin or eyes, unusual weakness or fatigue Pain, tingling, or numbness in the hands or feet, muscle weakness, change   in vision, confusion or trouble speaking, loss of balance or coordination, trouble walking, seizures Rash, fever, and swollen lymph nodes Redness, blistering, peeling, or loosening of the skin, including inside the mouth Sudden or severe stomach pain, bloody diarrhea, fever, nausea, vomiting Side effects that usually do not require medical attention (report these to your care team if they continue or are bothersome): Bone,  joint, or muscle pain Diarrhea Fatigue Loss of appetite Nausea Skin rash This list may not describe all possible side effects. Call your doctor for medical advice about side effects. You may report side effects to FDA at 1-800-FDA-1088. Where should I keep my medication? This medication is given in a hospital or clinic. It will not be stored at home. NOTE: This sheet is a summary. It may not cover all possible information. If you have questions about this medicine, talk to your doctor, pharmacist, or health care provider.  2024 Elsevier/Gold Standard (2021-12-24 00:00:00)   

## 2023-02-16 ENCOUNTER — Telehealth: Payer: Self-pay | Admitting: Family Medicine

## 2023-02-16 ENCOUNTER — Other Ambulatory Visit (INDEPENDENT_AMBULATORY_CARE_PROVIDER_SITE_OTHER): Payer: Self-pay | Admitting: Nurse Practitioner

## 2023-02-16 DIAGNOSIS — I872 Venous insufficiency (chronic) (peripheral): Secondary | ICD-10-CM

## 2023-02-16 DIAGNOSIS — M79604 Pain in right leg: Secondary | ICD-10-CM

## 2023-02-16 DIAGNOSIS — S81801S Unspecified open wound, right lower leg, sequela: Secondary | ICD-10-CM

## 2023-02-16 NOTE — Telephone Encounter (Signed)
Patient is having a physical July 1 with Dr. Roxan Hockey and wants to have labs done on wed. June 26th (if he needs anything done).  Please let patient know if he is due for labs.

## 2023-02-17 ENCOUNTER — Encounter: Payer: Medicare HMO | Admitting: Family Medicine

## 2023-02-18 ENCOUNTER — Other Ambulatory Visit: Payer: Self-pay

## 2023-02-18 DIAGNOSIS — R7989 Other specified abnormal findings of blood chemistry: Secondary | ICD-10-CM

## 2023-02-18 NOTE — Telephone Encounter (Signed)
B12 ordered this morning. Patient advised he does not have to be fasting

## 2023-02-19 ENCOUNTER — Telehealth: Payer: Self-pay | Admitting: *Deleted

## 2023-02-19 NOTE — Telephone Encounter (Addendum)
I called back to speak to the patient and left a message on the voicemail that the 1 thing that we want to know is he eating and drinking like a regular and then also does he drink any Ensure or boost and then the next thing is if he is not drinking enough then we could try working on that and eating better or if he wants to do the Ensure or boost or he can come over and we can do a nutritional evaluation with a nutritionist to see what they can do to make it better and I have left my direct number for him to call back so that we can know what to do. Pt wt 155 to 160 and now it is 150.

## 2023-02-20 ENCOUNTER — Ambulatory Visit (INDEPENDENT_AMBULATORY_CARE_PROVIDER_SITE_OTHER): Payer: Medicare HMO | Admitting: Nurse Practitioner

## 2023-02-20 ENCOUNTER — Ambulatory Visit (INDEPENDENT_AMBULATORY_CARE_PROVIDER_SITE_OTHER): Payer: Medicare HMO

## 2023-02-20 ENCOUNTER — Encounter (INDEPENDENT_AMBULATORY_CARE_PROVIDER_SITE_OTHER): Payer: Self-pay | Admitting: Nurse Practitioner

## 2023-02-20 VITALS — BP 154/73 | HR 42 | Resp 18 | Ht 69.0 in | Wt 153.4 lb

## 2023-02-20 DIAGNOSIS — S81801S Unspecified open wound, right lower leg, sequela: Secondary | ICD-10-CM | POA: Diagnosis not present

## 2023-02-20 DIAGNOSIS — I10 Essential (primary) hypertension: Secondary | ICD-10-CM | POA: Diagnosis not present

## 2023-02-20 DIAGNOSIS — M793 Panniculitis, unspecified: Secondary | ICD-10-CM

## 2023-02-20 DIAGNOSIS — I872 Venous insufficiency (chronic) (peripheral): Secondary | ICD-10-CM | POA: Diagnosis not present

## 2023-02-20 DIAGNOSIS — M79604 Pain in right leg: Secondary | ICD-10-CM | POA: Diagnosis not present

## 2023-02-21 NOTE — Progress Notes (Signed)
Subjective:    Patient ID: Casey Gallegos, male    DOB: Jul 15, 1935, 88 y.o.   MRN: [id number] Chief Complaint  Patient presents with   New Patient (Initial Visit)    Room-9  NP consult with le reflux    Greer Gabehart is an 88 year old male that presents today for evaluation of right lateral distal calf sore.  The area has been present for approximately a year.  Initially there was a wound and following various treatments the wound has been healed but the area itself is sore.  He notes that it is only sore when being pressed.  If not being touched it is not aching or throbbing.  The patient does have a history of venous disease.  He previously wore compression but he notes that he has not been wearing them due to the feeling that there causing worsening swelling above the level of compression.  He also notes that they are very uncomfortable.  Today noninvasive studies show no evidence of DVT in the right lower extremity.  No evidence of deep venous insufficiency.  The patient has deep venous insufficiency and small saphenous vein reflux.    Review of Systems  Cardiovascular:  Positive for leg swelling.  All other systems reviewed and are negative.      Objective:   Physical Exam Vitals reviewed.  HENT:     Head: Normocephalic.  Cardiovascular:     Rate and Rhythm: Normal rate.  Pulmonary:     Effort: Pulmonary effort is normal.  Musculoskeletal:     Right lower leg: Edema present.     Left lower leg: Edema present.  Skin:    General: Skin is warm and dry.  Neurological:     Mental Status: He is alert and oriented to person, place, and time.  Psychiatric:        Mood and Affect: Mood normal.        Behavior: Behavior normal.        Thought Content: Thought content normal.        Judgment: Judgment normal.     BP (!) 154/73 (BP Location: Right Arm)   Pulse (!) 42   Resp 18   Ht 5\' 9"  (1.753 m)   Wt 153 lb 6.4 oz (69.6 kg)   BMI 22.65 kg/m   Past Medical History:   Diagnosis Date   A-fib (HCC)    a.) CHA2DS2-VASc = 6 (age x2, HTN, DVT x2, vascular disease history). b.) rate/rhythm maintained on oral carvedilol; chronically anticoagulated using daily warfarin.   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   Bladder calculi    BPH (benign prostatic hyperplasia)    DDD (degenerative disc disease), lumbar    a.) s/p LEFT laminectomy L5   Diverticulosis    DOE (dyspnea on exertion)    DVT (deep venous thrombosis) (HCC)    Elevated PSA    GERD (gastroesophageal reflux disease)    H/O degenerative disc disease    HLD (hyperlipidemia)    Hypertension    Lipoma of arm    Lipomatosis    Long term current use of anticoagulant    a.) warfarin   Lung cancer (HCC)    Mass of upper lobe of left lung 04/24/2022  a.)  CT chest 04/24/2022: Multilobulated perihilar mass measuring 5.3 x 3.7 x 2.8 cm with associated LEFT hilar and mediastinal LAD; b.)  PET CT 05/07/2022: Hypermetabolic LEFT upper lobe lung mass (max SUV 15.1); ipsilateral nodal metastasis --> presuming a NSC histology, 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   NICM (nonischemic cardiomyopathy) (HCC) 11/22/2015   a.) TTE 11/22/2015: EF 40%. b.) TTE 12/27/2018: EF 45%. c.) TTE 08/30/2020: EF 45-50%.   Night sweats    Obesity    Osteoarthritis    RBBB (right bundle branch block)    Right inguinal hernia    VHD (valvular heart disease) 11/22/2015   a.) TTE 11/22/2015: EF 40%; mod MR, mod-sev TR, triv PR; mod BAE, mild RV enlargement. b.) TTE 12/27/2018: EF 45%; mod MR, mod-sev TR, triv PR; mod BAE, mild RV enlargement. c.) TTE 08/30/2020: EF 45-50%; mild MR/TR.    Social History   Socioeconomic History   Marital status: Married    Spouse name: Doris    Number of children: 3   Years of education: Not on file   Highest education level: Associate degree: occupational, Scientist, product/process development, or vocational program  Occupational History   Occupation: retired  Tobacco Use   Smoking status: Former    Types: Cigarettes    Quit date: 08/26/1971    Years since quitting: 51.5    Passive exposure: Never   Smokeless tobacco: Never   Tobacco comments:    Smoked for about 20 years.  Vaping Use   Vaping Use: Never used  Substance and Sexual Activity   Alcohol use: Not Currently   Drug use: No   Sexual activity: Not Currently  Other Topics Concern   Not on file  Social History Narrative   Not on file   Social Determinants of Health   Financial Resource Strain: Low Risk  (12/23/2022)   Overall Financial Resource Strain (CARDIA)    Difficulty of Paying Living Expenses: Not hard at all  Food Insecurity: No Food Insecurity (12/23/2022)   Hunger Vital Sign    Worried About Running Out of Food in the Last Year: Never true    Ran Out of Food in the Last Year: Never true  Transportation Needs: No Transportation Needs (12/23/2022)   PRAPARE - Administrator, Civil Service (Medical): No    Lack of Transportation (Non-Medical): No  Physical Activity: Sufficiently Active (12/23/2022)   Exercise Vital Sign    Days of Exercise per Week: 7 days    Minutes of Exercise per Session: 50 min  Stress: No Stress Concern Present (12/18/2021)   [employer] of Occupational Health - Occupational Stress Questionnaire    Feeling of Stress : Only a little  Social Connections: Moderately Integrated (12/23/2022)   Social Connection and Isolation Panel [NHANES]    Frequency of Communication with Friends and Family: Twice a week    Frequency of Social Gatherings with Friends and Family: Once a week    Attends Religious Services: More than 4 times per year    Active Member of [employer] or Organizations: No    Attends Banker Meetings: Never    Marital  Status: Married  Catering manager Violence: Not At Risk (12/23/2022)   Humiliation, Afraid, Rape, and Kick questionnaire    Fear of Current or Ex-Partner: No    Emotionally Abused: No    Physically Abused: No    Sexually Abused: No    Past Surgical History:  Procedure Laterality  Date   Bilateral Radical Keratotomy Bilateral 1997   COLONOSCOPY WITH PROPOFOL N/A 01/11/2016   Procedure: COLONOSCOPY WITH PROPOFOL;  Surgeon: Scot Jun, MD;  Location: [facility] ENDOSCOPY;  Service: Endoscopy;  Laterality: N/A; repeat 3 years, tubular adenoma   DENTAL SURGERY     Patient had implants   FLEXIBLE BRONCHOSCOPY Left 05/19/2022   Procedure: FLEXIBLE BRONCHOSCOPY;  Surgeon: Salena Saner, MD;  Location: [facility] ORS;  Service: Cardiopulmonary;  Laterality: Left;   GUM SURGERY     IR IMAGING GUIDED PORT INSERTION  06/13/2022   L4 - S1 decompression Left    PROSTATE BIOPSY     TONSILLECTOMY AND ADENOIDECTOMY  age 77   VIDEO BRONCHOSCOPY WITH ENDOBRONCHIAL ULTRASOUND Left 05/19/2022   Procedure: VIDEO BRONCHOSCOPY WITH ENDOBRONCHIAL ULTRASOUND;  Surgeon: Salena Saner, MD;  Location: [facility] ORS;  Service: Cardiopulmonary;  Laterality: Left;   XI ROBOTIC ASSISTED INGUINAL HERNIA REPAIR WITH MESH Right 10/21/2021   Procedure: XI ROBOTIC ASSISTED INGUINAL HERNIA REPAIR WITH MESH;  Surgeon: Campbell Lerner, MD;  Location: [facility] ORS;  Service: General;  Laterality: Right;    Family History  Problem Relation Age of Onset   Cancer Mother        secondary to cancinoma of the jaw from her dipping snuff.   Heart attack Father    CAD Father    Hypertension Sister    Diabetes Son        Type I diabetes   Prostate cancer Neg Hx    Kidney cancer Neg Hx     Allergies  Allergen Reactions   Cefdinir Nausea Only    hypersensitivity to smell   Doxycycline     Sun sensitivity   Prednisone     Hallucinations    Sertraline Other (See Comments)    Hallucinations        Latest Ref Rng & Units  02/13/2023   12:43 PM 01/16/2023    9:06 AM 12/19/2022   12:50 PM  CBC  WBC 4.0 - 10.5 K/uL 3.2  3.4  3.1   Hemoglobin 13.0 - 17.0 g/dL 16.1  09.6  04.5   Hematocrit 39.0 - 52.0 % 30.5  31.3  30.3   Platelets 150 - 400 K/uL 115  115  110       CMP     Component Value Date/Time   NA 139 02/13/2023 1243   NA 141 04/22/2022 1543   K 4.2 02/13/2023 1243   CL 108 02/13/2023 1243   CO2 24 02/13/2023 1243   GLUCOSE 93 02/13/2023 1243   BUN 23 02/13/2023 1243   BUN 18 04/22/2022 1543   CREATININE 0.99 02/13/2023 1243   CALCIUM 8.6 (L) 02/13/2023 1243   PROT 5.9 (L) 02/13/2023 1243   PROT 6.1 04/22/2022 1543   ALBUMIN 3.6 02/13/2023 1243   ALBUMIN 3.8 04/22/2022 1543   AST 20 02/13/2023 1243   ALT 16 02/13/2023 1243   ALKPHOS 49 02/13/2023 1243   BILITOT 1.2 02/13/2023 1243   BILITOT 1.1 04/22/2022 1543   EGFR 59 (L) 04/22/2022 1543   GFRNONAA >60 02/13/2023 1243     No results found.     Assessment & Plan:   1. Chronic venous insufficiency No surgery or intervention at this point in time.   The patient is CEAP [id number]   I have discussed with the patient venous insufficiency and why it  causes symptoms. I have discussed with the patient the chronic skin changes that accompany venous insufficiency and the long  term sequela such as infection and ulceration.  Patient will begin wearing graduated compression stockings or compression wraps on a daily basis.  The patient will put the compression on first thing in the morning and removing them in the evening. The patient is instructed specifically not to sleep in the compression.    In addition, behavioral modification including several periods of elevation of the lower extremities during the day will be continued. I have demonstrated that proper elevation is a position with the ankles at heart level.  The patient is instructed to begin routine exercise, especially walking on a daily basis   The patient will follow up in 2-3  months to reassess the degree of swelling and the control that graduated compression stockings or compression wraps  is offering.   At that time the patient can be assessed for a Lymph Pump depending on the effectiveness of conservative therapy and the control of the associated lymphedema.  2. Primary hypertension Continue antihypertensive medications as already ordered, these medications have been reviewed and there are no changes at this time.  3. Lipodermatosclerosis I suspect that the area that is tender and concerning for the patient is actually lipodermatosclerosis resulting from his chronic venous insufficiency.  He does have an upcoming evaluation with dermatology.  I will reach out for evaluation of feasibility of biopsy of the area to determine if this is indeed lipodermatosclerosis.   Current Outpatient Medications on File Prior to Visit  Medication Sig Dispense Refill   amLODipine (NORVASC) 5 MG tablet TAKE 1 TABLET (5 MG TOTAL) BY MOUTH DAILY. 90 tablet 3   benazepril (LOTENSIN) 40 MG tablet Take 1 tablet (40 mg total) by mouth daily. 90 tablet 1   carvedilol (COREG) 6.25 MG tablet TAKE 1 TABLET (6.25 MG TOTAL) BY MOUTH 2 (TWO) TIMES DAILY. 180 tablet 3   Cyanocobalamin (VITAMIN B 12 PO) Take 1 capsule by mouth 2 (two) times daily. 1000 mcg each capsule     doxazosin (CARDURA) 8 MG tablet TAKE 1 TABLET (8 MG TOTAL) BY MOUTH DAILY. 90 tablet 3   finasteride (PROSCAR) 5 MG tablet TAKE 1 TABLET EVERY DAY 90 tablet 3   gabapentin (NEURONTIN) 300 MG capsule TAKE 1 CAPSULE EVERY MORNING, 1 CAPSULE IN THE AFTERNOON AND 2 CAPSULES AT BEDTIME 360 capsule 2   Magnesium 500 MG TABS Take 500 mg by mouth daily.     Multiple Vitamin (MULTI-VITAMIN) tablet Take 1 tablet by mouth daily.      omeprazole (PRILOSEC) 20 MG capsule Take 20 mg by mouth daily.     oxyCODONE (OXY IR/ROXICODONE) 5 MG immediate release tablet Take 1 tablet (5 mg total) by mouth every 6 (six) hours as needed for severe pain.  90 tablet 0   triamterene-hydrochlorothiazide (MAXZIDE-25) 37.5-25 MG tablet TAKE 1/2 TABLET EVERY DAY 45 tablet 1   warfarin (COUMADIN) 5 MG tablet Take 1 tablet (5 mg total) by mouth daily. 90 tablet 1   Current Facility-Administered Medications on File Prior to Visit  Medication Dose Route Frequency Provider Last Rate Last Admin   heparin lock flush 100 UNIT/ML injection             There are no Patient Instructions on file for this visit. No follow-ups on file.   Georgiana Spinner, NP

## 2023-02-23 ENCOUNTER — Encounter: Payer: Self-pay | Admitting: Family Medicine

## 2023-02-23 ENCOUNTER — Ambulatory Visit (INDEPENDENT_AMBULATORY_CARE_PROVIDER_SITE_OTHER): Payer: Medicare HMO | Admitting: Family Medicine

## 2023-02-23 VITALS — BP 135/72 | HR 65 | Resp 13 | Ht 69.0 in | Wt 152.8 lb

## 2023-02-23 DIAGNOSIS — I10 Essential (primary) hypertension: Secondary | ICD-10-CM | POA: Diagnosis not present

## 2023-02-23 DIAGNOSIS — Z0000 Encounter for general adult medical examination without abnormal findings: Secondary | ICD-10-CM

## 2023-02-23 DIAGNOSIS — I480 Paroxysmal atrial fibrillation: Secondary | ICD-10-CM | POA: Diagnosis not present

## 2023-02-23 LAB — POCT INR
INR: 1.9 — AB (ref 2.0–3.0)
POC INR: 1.9
PT: 23.1

## 2023-02-23 NOTE — Progress Notes (Signed)
I,Vanessa  Vital,acting as a Neurosurgeon for [employer], MD.,have documented all relevant documentation on the behalf of Ronnald Ramp, MD,as directed by  Ronnald Ramp, MD while in the presence of Ronnald Ramp, MD.   Complete physical exam   [name]   DOB: [birth year]   88 y.o. Male  MRN: [id number] Visit Date: 02/23/2023  Today's healthcare provider: Ronnald Ramp, MD   Chief Complaint  Patient presents with   Annual Exam   Subjective    Casey Gallegos is a 88 y.o. male who presents today for a complete physical exam.   He reports consuming a general diet.  He generally feels well. He reports sleeping well. He does not have additional problems to discuss today.  HPI    Past Medical History:  Diagnosis Date   A-fib [facility])    a.) CHA2DS2-VASc = 6 (age x2, HTN, DVT x2, vascular disease history). b.) rate/rhythm maintained on oral carvedilol; chronically anticoagulated using daily warfarin.   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   Bladder calculi    BPH (benign prostatic hyperplasia)    DDD (degenerative disc disease), lumbar    a.) s/p LEFT laminectomy L5   Diverticulosis    DOE (dyspnea on exertion)    DVT (deep venous thrombosis) (HCC)    Elevated PSA    GERD (gastroesophageal reflux disease)    H/O degenerative disc disease    HLD (hyperlipidemia)    Hypertension    Lipoma of arm    Lipomatosis    Long term current use of anticoagulant    a.) warfarin   Lung cancer (HCC)    Mass of upper lobe of left lung 04/24/2022   a.)  CT chest 04/24/2022: Multilobulated perihilar mass measuring 5.3 x 3.7 x 2.8 cm with associated LEFT hilar and mediastinal LAD; b.)  PET CT 05/07/2022: Hypermetabolic LEFT  upper lobe lung mass (max SUV 15.1); ipsilateral nodal metastasis --> presuming a NSC histology, 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   NICM (nonischemic cardiomyopathy) (HCC) 11/22/2015   a.) TTE 11/22/2015: EF 40%. b.) TTE 12/27/2018: EF 45%. c.) TTE 08/30/2020: EF 45-50%.   Night sweats    Obesity    Osteoarthritis    RBBB (right bundle branch block)    Right inguinal hernia    VHD (valvular heart disease) 11/22/2015   a.) TTE 11/22/2015: EF 40%; mod MR, mod-sev TR, triv PR; mod BAE, mild RV enlargement. b.) TTE 12/27/2018: EF 45%; mod MR, mod-sev TR, triv PR; mod BAE, mild RV enlargement. c.) TTE 08/30/2020: EF 45-50%; mild MR/TR.   Past Surgical History:  Procedure Laterality Date   Bilateral Radical Keratotomy Bilateral 1997   COLONOSCOPY WITH PROPOFOL N/A 01/11/2016   Procedure: COLONOSCOPY WITH PROPOFOL;  Surgeon: Scot Jun, MD;  Location: [facility];  Service: Endoscopy;  Laterality: N/A; repeat 3 years, tubular adenoma   DENTAL SURGERY     Patient had implants   FLEXIBLE BRONCHOSCOPY Left 05/19/2022   Procedure: FLEXIBLE BRONCHOSCOPY;  Surgeon: Salena Saner, MD;  Location: [facility] ORS;  Service: Cardiopulmonary;  Laterality: Left;   GUM SURGERY     IR IMAGING GUIDED PORT INSERTION  06/13/2022   L4 - S1  decompression Left    PROSTATE BIOPSY     TONSILLECTOMY AND ADENOIDECTOMY  age 27   VIDEO BRONCHOSCOPY WITH ENDOBRONCHIAL ULTRASOUND Left 05/19/2022   Procedure: VIDEO BRONCHOSCOPY WITH ENDOBRONCHIAL ULTRASOUND;  Surgeon: Salena Saner, MD;  Location: [facility] ORS;  Service: Cardiopulmonary;  Laterality: Left;   XI ROBOTIC ASSISTED INGUINAL HERNIA REPAIR WITH MESH Right 10/21/2021   Procedure: XI ROBOTIC ASSISTED INGUINAL HERNIA REPAIR WITH MESH;  Surgeon: Campbell Lerner, MD;  Location: [facility] ORS;  Service: General;  Laterality: Right;   Social History   Socioeconomic History   Marital status: Married    Spouse name:  Doris   Number of children: 3   Years of education: Not on file   Highest education level: Associate degree: occupational, Scientist, product/process development, or vocational program  Occupational History   Occupation: retired  Tobacco Use   Smoking status: Former    Types: Cigarettes    Quit date: 08/26/1971    Years since quitting: 51.5    Passive exposure: Never   Smokeless tobacco: Never   Tobacco comments:    Smoked for about 20 years.  Vaping Use   Vaping Use: Never used  Substance and Sexual Activity   Alcohol use: Not Currently   Drug use: No   Sexual activity: Not Currently  Other Topics Concern   Not on file  Social History Narrative   Not on file   Social Determinants of Health   Financial Resource Strain: Low Risk  (12/23/2022)   Overall Financial Resource Strain (CARDIA)    Difficulty of Paying Living Expenses: Not hard at all  Food Insecurity: No Food Insecurity (12/23/2022)   Hunger Vital Sign    Worried About Running Out of Food in the Last Year: Never true    Ran Out of Food in the Last Year: Never true  Transportation Needs: No Transportation Needs (12/23/2022)   PRAPARE - Administrator, Civil Service (Medical): No    Lack of Transportation (Non-Medical): No  Physical Activity: Sufficiently Active (12/23/2022)   Exercise Vital Sign    Days of Exercise per Week: 7 days    Minutes of Exercise per Session: 50 min  Stress: No Stress Concern Present (12/18/2021)   [employer] of Occupational Health - Occupational Stress Questionnaire    Feeling of Stress : Only a little  Social Connections: Moderately Integrated (12/23/2022)   Social Connection and Isolation Panel [NHANES]    Frequency of Communication with Friends and Family: Twice a week    Frequency of Social Gatherings with Friends and Family: Once a week    Attends Religious Services: More than 4 times per year    Active Member of [employer] or Organizations: No    Attends Banker Meetings: Never     Marital Status: Married  Catering manager Violence: Not At Risk (12/23/2022)   Humiliation, Afraid, Rape, and Kick questionnaire    Fear of Current or Ex-Partner: No    Emotionally Abused: No    Physically Abused: No    Sexually Abused: No   Family Status  Relation Name Status   Mother  Deceased at age 90       died from cancer   Father  Deceased at age 73       with CAD and MI   Sister  Alive   Son  Alive   Neg Hx  (Not Specified)   Family History  Problem Relation Age of Onset   Cancer Mother  secondary to cancinoma of the jaw from her dipping snuff.   Heart attack Father    CAD Father    Hypertension Sister    Diabetes Son        Type I diabetes   Prostate cancer Neg Hx    Kidney cancer Neg Hx    Allergies  Allergen Reactions   Cefdinir Nausea Only    hypersensitivity to smell   Doxycycline     Sun sensitivity   Prednisone     Hallucinations    Sertraline Other (See Comments)    Hallucinations     Patient Care Team: Ronnald Ramp, MD as PCP - General (Family Medicine) Vanna Scotland, MD as Consulting Physician (Urology) Lamar Blinks, MD as Consulting Physician (Cardiology) Lockie Mola, MD as Referring Physician (Ophthalmology) Deirdre Evener, MD (Dermatology) Glory Buff, RN as Oncology Nurse Navigator Creig Hines, MD as Consulting Physician (Oncology) Salena Saner, MD as Consulting Physician (Pulmonary Disease)   Medications: Outpatient Medications Prior to Visit  Medication Sig   amLODipine (NORVASC) 5 MG tablet TAKE 1 TABLET (5 MG TOTAL) BY MOUTH DAILY.   benazepril (LOTENSIN) 40 MG tablet Take 1 tablet (40 mg total) by mouth daily.   carvedilol (COREG) 6.25 MG tablet TAKE 1 TABLET (6.25 MG TOTAL) BY MOUTH 2 (TWO) TIMES DAILY.   doxazosin (CARDURA) 8 MG tablet TAKE 1 TABLET (8 MG TOTAL) BY MOUTH DAILY.   finasteride (PROSCAR) 5 MG tablet TAKE 1 TABLET EVERY DAY   gabapentin (NEURONTIN) 300 MG capsule TAKE 1  CAPSULE EVERY MORNING, 1 CAPSULE IN THE AFTERNOON AND 2 CAPSULES AT BEDTIME   Magnesium 500 MG TABS Take 500 mg by mouth daily.   Multiple Vitamin (MULTI-VITAMIN) tablet Take 1 tablet by mouth daily.    oxyCODONE (OXY IR/ROXICODONE) 5 MG immediate release tablet Take 1 tablet (5 mg total) by mouth every 6 (six) hours as needed for severe pain.   triamterene-hydrochlorothiazide (MAXZIDE-25) 37.5-25 MG tablet TAKE 1/2 TABLET EVERY DAY   warfarin (COUMADIN) 5 MG tablet Take 1 tablet (5 mg total) by mouth daily.   Cyanocobalamin (VITAMIN B 12 PO) Take 1 capsule by mouth 2 (two) times daily. 1000 mcg each capsule (Patient not taking: Reported on 02/23/2023)   omeprazole (PRILOSEC) 20 MG capsule Take 20 mg by mouth daily. (Patient not taking: Reported on 02/23/2023)   Facility-Administered Medications Prior to Visit  Medication Dose Route Frequency Provider   heparin lock flush 100 UNIT/ML injection         Review of Systems  HENT:  Positive for hearing loss.   Cardiovascular:  Positive for leg swelling.      Objective    BP 135/72 (BP Location: Left Arm, Patient Position: Sitting, Cuff Size: Normal)   Pulse 65   Resp 13   Ht 5\' 9"  (1.753 m)   Wt 152 lb 12.8 oz (69.3 kg)   SpO2 98%   BMI 22.56 kg/m     Physical Exam Vitals reviewed.  Constitutional:      General: He is not in acute distress.    Appearance: Normal appearance. He is not ill-appearing, toxic-appearing or diaphoretic.  Eyes:     Conjunctiva/sclera: Conjunctivae normal.  Cardiovascular:     Rate and Rhythm: Regular rhythm. Bradycardia present.     Pulses: Normal pulses.     Heart sounds: Normal heart sounds. No murmur heard.    No friction rub. No gallop.  Pulmonary:     Effort: Pulmonary effort is normal.  No respiratory distress.     Breath sounds: Normal breath sounds. No stridor. No wheezing, rhonchi or rales.  Abdominal:     General: Bowel sounds are normal. There is no distension.     Palpations: Abdomen is  soft.     Tenderness: There is no abdominal tenderness.  Musculoskeletal:     Right lower leg: 2+ Pitting Edema present.     Left lower leg: 2+ Pitting Edema present.  Skin:    Findings: No erythema or rash.  Neurological:     Mental Status: He is alert and oriented to person, place, and time.       Last depression screening scores    01/06/2023   10:59 AM 12/23/2022    9:12 AM 02/20/2022    3:20 PM  PHQ 2/9 Scores  PHQ - 2 Score 0 0 2  PHQ- 9 Score   4    Last fall risk screening    01/06/2023   10:59 AM  Fall Risk   Falls in the past year? 0  Number falls in past yr: 0  Injury with Fall? 0  Risk for fall due to : No Fall Risks    Last Audit-C alcohol use screening    02/23/2023    8:38 AM  Alcohol Use Disorder Test (AUDIT)  1. How often do you have a drink containing alcohol? 0  2. How many drinks containing alcohol do you have on a typical day when you are drinking? 0  3. How often do you have six or more drinks on one occasion? 0  AUDIT-C Score 0   A score of 3 or more in women, and 4 or more in men indicates increased risk for alcohol abuse, EXCEPT if all of the points are from question 1   No results found for any visits on 02/23/23.  Assessment & Plan    Routine Health Maintenance and Physical Exam  Immunization History  Administered Date(s) Administered   Fluad Quad(high Dose 65+) 05/10/2019, 07/03/2021, 05/30/2022   Influenza, High Dose Seasonal PF 05/11/2015, 05/21/2016, 05/27/2017, 05/26/2018, 05/01/2020   PFIZER(Purple Top)SARS-COV-2 Vaccination 09/09/2019, 09/30/2019, 05/25/2020   Pneumococcal Conjugate-13 01/09/2014   Pneumococcal Polysaccharide-23 08/08/2004   Td 10/25/2008    Health Maintenance  Topic Date Due   Zoster Vaccines- Shingrix (1 of 2) Never done   DTaP/Tdap/Td (2 - Tdap) 10/26/2018   COVID-19 Vaccine (4 - 2023-24 season) 04/25/2022   INFLUENZA VACCINE  03/26/2023   Medicare Annual Wellness (AWV)  12/23/2023   Pneumonia Vaccine  24+ Years old  Completed   HPV VACCINES  Aged Out    Problem List Items Addressed This Visit     AF (paroxysmal atrial fibrillation) (HCC)    INR collected, below goal of 2-3 at 1.9 today  Continue warfarin, increased to 5mg  daily  Next INR check in 2 weeks       Relevant Orders   POCT INR   Essential (primary) hypertension   Annual physical exam - Primary    Chronic conditions are stable  Patient was counseled on benefits of regular physical activity with goal of 150 minutes of moderate to vigurous intensity 4 days per week  Patient was counseled to consume well balanced diet of fruits, vegetables, limited saturated fats and limited sugary foods and beverages with emphasis on consuming 6-8 glasses of water daily  Patient is UTD on age appropriate screenings         Return in about 6 months (around 08/26/2023) for CHRONIC  F/U.       The entirety of the information documented in the History of Present Illness, Review of Systems and Physical Exam were personally obtained by me. Portions of this information were initially documented by Lubertha Basque, CMA . I, Ronnald Ramp, MD have reviewed the documentation above for thoroughness and accuracy.      Ronnald Ramp, MD  [facility] [phone] (phone) [phone] (fax)  [facility]

## 2023-02-23 NOTE — Assessment & Plan Note (Addendum)
INR collected, below goal of 2-3 at 1.9 today  Continue warfarin, increased to 5mg  daily  Next INR check in 2 weeks

## 2023-02-23 NOTE — Patient Instructions (Signed)
Health Maintenance After Age 86 After age 86, you are at a higher risk for certain long-term diseases and infections as well as injuries from falls. Falls are a major cause of broken bones and head injuries in people who are older than age 86. Getting regular preventive care can help to keep you healthy and well. Preventive care includes getting regular testing and making lifestyle changes as recommended by your health care provider. Talk with your health care provider about: Which screenings and tests you should have. A screening is a test that checks for a disease when you have no symptoms. A diet and exercise plan that is right for you. What should I know about screenings and tests to prevent falls? Screening and testing are the best ways to find a health problem early. Early diagnosis and treatment give you the best chance of managing medical conditions that are common after age 86. Certain conditions and lifestyle choices may make you more likely to have a fall. Your health care provider may recommend: Regular vision checks. Poor vision and conditions such as cataracts can make you more likely to have a fall. If you wear glasses, make sure to get your prescription updated if your vision changes. Medicine review. Work with your health care provider to regularly review all of the medicines you are taking, including over-the-counter medicines. Ask your health care provider about any side effects that may make you more likely to have a fall. Tell your health care provider if any medicines that you take make you feel dizzy or sleepy. Strength and balance checks. Your health care provider may recommend certain tests to check your strength and balance while standing, walking, or changing positions. Foot health exam. Foot pain and numbness, as well as not wearing proper footwear, can make you more likely to have a fall. Screenings, including: Osteoporosis screening. Osteoporosis is a condition that causes  the bones to get weaker and break more easily. Blood pressure screening. Blood pressure changes and medicines to control blood pressure can make you feel dizzy. Depression screening. You may be more likely to have a fall if you have a fear of falling, feel depressed, or feel unable to do activities that you used to do. Alcohol use screening. Using too much alcohol can affect your balance and may make you more likely to have a fall. Follow these instructions at home: Lifestyle Do not drink alcohol if: Your health care provider tells you not to drink. If you drink alcohol: Limit how much you have to: 0-1 drink a day for women. 0-2 drinks a day for men. Know how much alcohol is in your drink. In the [place] one drink equals one 12 oz bottle of beer (355 mL), one 5 oz glass of wine (148 mL), or one 1 oz glass of hard liquor (44 mL). Do not use any products that contain nicotine or tobacco. These products include cigarettes, chewing tobacco, and vaping devices, such as e-cigarettes. If you need help quitting, ask your health care provider. Activity  Follow a regular exercise program to stay fit. This will help you maintain your balance. Ask your health care provider what types of exercise are appropriate for you. If you need a cane or walker, use it as recommended by your health care provider. Wear supportive shoes that have nonskid soles. Safety  Remove any tripping hazards, such as rugs, cords, and clutter. Install safety equipment such as grab bars in bathrooms and safety rails on stairs. Keep rooms and walkways   well-lit. General instructions Talk with your health care provider about your risks for falling. Tell your health care provider if: You fall. Be sure to tell your health care provider about all falls, even ones that seem minor. You feel dizzy, tiredness (fatigue), or off-balance. Take over-the-counter and prescription medicines only as told by your health care provider. These include  supplements. Eat a healthy diet and maintain a healthy weight. A healthy diet includes low-fat dairy products, low-fat (lean) meats, and fiber from whole grains, beans, and lots of fruits and vegetables. Stay current with your vaccines. Schedule regular health, dental, and eye exams. Summary Having a healthy lifestyle and getting preventive care can help to protect your health and wellness after age 86. Screening and testing are the best way to find a health problem early and help you avoid having a fall. Early diagnosis and treatment give you the best chance for managing medical conditions that are more common for people who are older than age 86. Falls are a major cause of broken bones and head injuries in people who are older than age 86. Take precautions to prevent a fall at home. Work with your health care provider to learn what changes you can make to improve your health and wellness and to prevent falls. This information is not intended to replace advice given to you by your health care provider. Make sure you discuss any questions you have with your health care provider. Document Revised: 12/31/2020 Document Reviewed: 12/31/2020 Elsevier Patient Education  2024 [employer]vier Inc.  

## 2023-02-23 NOTE — Assessment & Plan Note (Signed)
Chronic conditions are stable  Patient was counseled on benefits of regular physical activity with goal of 150 minutes of moderate to vigurous intensity 4 days per week  Patient was counseled to consume well balanced diet of fruits, vegetables, limited saturated fats and limited sugary foods and beverages with emphasis on consuming 6-8 glasses of water daily  Patient is UTD on age appropriate screenings

## 2023-02-25 ENCOUNTER — Ambulatory Visit: Payer: Medicare HMO

## 2023-02-27 ENCOUNTER — Encounter: Payer: Self-pay | Admitting: Pulmonary Disease

## 2023-03-02 ENCOUNTER — Encounter: Payer: Self-pay | Admitting: Dermatology

## 2023-03-02 ENCOUNTER — Ambulatory Visit (INDEPENDENT_AMBULATORY_CARE_PROVIDER_SITE_OTHER): Payer: Medicare HMO | Admitting: Dermatology

## 2023-03-02 VITALS — BP 140/60 | HR 58

## 2023-03-02 DIAGNOSIS — D229 Melanocytic nevi, unspecified: Secondary | ICD-10-CM

## 2023-03-02 DIAGNOSIS — M793 Panniculitis, unspecified: Secondary | ICD-10-CM

## 2023-03-02 DIAGNOSIS — L814 Other melanin hyperpigmentation: Secondary | ICD-10-CM

## 2023-03-02 DIAGNOSIS — D225 Melanocytic nevi of trunk: Secondary | ICD-10-CM | POA: Diagnosis not present

## 2023-03-02 DIAGNOSIS — L280 Lichen simplex chronicus: Secondary | ICD-10-CM | POA: Diagnosis not present

## 2023-03-02 DIAGNOSIS — L708 Other acne: Secondary | ICD-10-CM | POA: Diagnosis not present

## 2023-03-02 DIAGNOSIS — L821 Other seborrheic keratosis: Secondary | ICD-10-CM

## 2023-03-02 DIAGNOSIS — D2239 Melanocytic nevi of other parts of face: Secondary | ICD-10-CM | POA: Diagnosis not present

## 2023-03-02 DIAGNOSIS — D1801 Hemangioma of skin and subcutaneous tissue: Secondary | ICD-10-CM

## 2023-03-02 DIAGNOSIS — D235 Other benign neoplasm of skin of trunk: Secondary | ICD-10-CM

## 2023-03-02 MED ORDER — TRIAMCINOLONE ACETONIDE 0.1 % EX CREA
TOPICAL_CREAM | CUTANEOUS | 0 refills | Status: DC
Start: 2023-03-02 — End: 2023-09-25

## 2023-03-02 MED ORDER — TACROLIMUS 0.1 % EX OINT
TOPICAL_OINTMENT | Freq: Two times a day (BID) | CUTANEOUS | 2 refills | Status: DC
Start: 2023-03-02 — End: 2023-09-25

## 2023-03-02 NOTE — Patient Instructions (Addendum)
For right lower leg  Lipodermatosclerosis is a chronic inflammatory condition of unknown cause of the subcutaneous fat causing tenderness, discoloration and hardening of the involved skin, most commonly on the lower legs. Discussed that it may progress and gradually worsen over time, especially in the setting of chronic leg swelling. Daily compression stockings/hose is recommended.   Recommend to wear compression stocking as instructed by vein specialist  Start triamcinolone 0.1 cream - apply to affected area at right lower leg twice daily for 4 weeks. Avoid applying to face, groin, and axilla. Use as directed. Long-term use can cause thinning of the skin.  Topical steroids (such as triamcinolone, fluocinolone, fluocinonide, mometasone, clobetasol, halobetasol, betamethasone, hydrocortisone) can cause thinning and lightening of the skin if they are used for too long in the same area. Your physician has selected the right strength medicine for your problem and area affected on the body. Please use your medication only as directed by your physician to prevent side effects.   Start after 4 weeks Start tacrolimus ointment apply to affected area twice daily.     For thickened skin at buttock  Apply tacrolimus ointment - apply topically to affected area twice daily   Continue using desitin , aquaphor, vaseline  barrier cream to affected area daily  Avoid scratching area           Due to recent changes in healthcare laws, you may see results of your pathology and/or laboratory studies on MyChart before the doctors have had a chance to review them. We understand that in some cases there may be results that are confusing or concerning to you. Please understand that not all results are received at the same time and often the doctors may need to interpret multiple results in order to provide you with the best plan of care or course of treatment. Therefore, we ask that you please give [place] 2  business days to thoroughly review all your results before contacting the office for clarification. Should we see a critical lab result, you will be contacted sooner.   If You Need Anything After Your Visit  If you have any questions or concerns for your doctor, please call our main line at [phone] and press option 4 to reach your doctor's medical assistant. If no one answers, please leave a voicemail as directed and we will return your call as soon as possible. Messages left after 4 pm will be answered the following business day.   You may also send [place] a message via MyChart. We typically respond to MyChart messages within 1-2 business days.  For prescription refills, please ask your pharmacy to contact our office. Our fax number is [phone].  If you have an urgent issue when the clinic is closed that cannot wait until the next business day, you can page your doctor at the number below.    Please note that while we do our best to be available for urgent issues outside of office hours, we are not available 24/7.   If you have an urgent issue and are unable to reach [place], you may choose to seek medical care at your doctor's office, retail clinic, urgent care center, or emergency room.  If you have a medical emergency, please immediately call 911 or go to the emergency department.  Pager Numbers  - Dr. Gwen Pounds: [phone]  - Dr. Neale Burly: [phone]  - Dr. Roseanne Reno: [phone]  In the event of inclement weather, please call our main line at [phone] for an update on  the status of any delays or closures.  Dermatology Medication Tips: Please keep the boxes that topical medications come in in order to help keep track of the instructions about where and how to use these. Pharmacies typically print the medication instructions only on the boxes and not directly on the medication tubes.   If your medication is too expensive, please contact our office at [phone] option 4 or  send [place] a message through MyChart.   We are unable to tell what your co-pay for medications will be in advance as this is different depending on your insurance coverage. However, we may be able to find a substitute medication at lower cost or fill out paperwork to get insurance to cover a needed medication.   If a prior authorization is required to get your medication covered by your insurance company, please allow [place] 1-2 business days to complete this process.  Drug prices often vary depending on where the prescription is filled and some pharmacies may offer cheaper prices.  The website www.goodrx.com contains coupons for medications through different pharmacies. The prices here do not account for what the cost may be with help from insurance (it may be cheaper with your insurance), but the website can give you the price if you did not use any insurance.  - You can print the associated coupon and take it with your prescription to the pharmacy.  - You may also stop by our office during regular business hours and pick up a GoodRx coupon card.  - If you need your prescription sent electronically to a different pharmacy, notify our office through [facility] or by phone at [phone] option 4.     Si Usted Necesita Algo Despus de Su Visita  Tambin puede enviarnos un mensaje a travs de Clinical cytogeneticist. Por lo general respondemos a los mensajes de MyChart en el transcurso de 1 a 2 das hbiles.  Para renovar recetas, por favor pida a su farmacia que se ponga en contacto con nuestra oficina. Annie Sable de fax es [place] [phone].  Si tiene un asunto urgente cuando la clnica est cerrada y que no puede esperar hasta el siguiente da hbil, puede llamar/localizar a su doctor(a) al nmero que aparece a continuacin.   Por favor, tenga en cuenta que aunque hacemos todo lo posible para estar disponibles para asuntos urgentes fuera del horario de [place], no estamos disponibles las 24 horas del  da, los 7 [address]  [address] de la [place].   Si tiene un problema urgente y no puede comunicarse con nosotros, puede optar por buscar atencin mdica  en el consultorio de su doctor(a), en una clnica privada, en un centro de atencin urgente o en una sala de emergencias.  Si tiene Engineer, drilling, por favor llame inmediatamente al 911 o vaya a la sala de emergencias.  Nmeros de bper  - Dr. Gwen Pounds: [phone]  - Dra. Moye: [phone]  - Dra. Roseanne Reno: [phone]  En caso de inclemencias del [place], por favor llame a Lacy Duverney principal al [phone] para una actualizacin sobre el [place] de cualquier retraso o cierre.  Consejos para la medicacin en dermatologa: Por favor, guarde las cajas en las que vienen los medicamentos de uso tpico para ayudarle a seguir las instrucciones sobre dnde y cmo usarlos. Las farmacias generalmente imprimen las instrucciones del medicamento slo en las cajas y no directamente en los tubos del [place].   Si su medicamento es muy caro, por favor, pngase en contacto con nuestra oficina llamando al [phone]  y presione la opcin 4 o envenos un mensaje a travs de Clinical cytogeneticist.   No podemos decirle cul ser su copago por los medicamentos por adelantado ya que esto es diferente dependiendo de la cobertura de su seguro. Sin embargo, es posible que podamos encontrar un medicamento sustituto a Audiological scientist un formulario para que el seguro cubra el medicamento que se considera necesario.   Si se requiere una autorizacin previa para que su compaa de seguros [place] su medicamento, por favor permtanos de 1 a 2 das hbiles para completar [address]  Los precios de los medicamentos varan con frecuencia dependiendo del Environmental consultant de dnde se surte la receta y alguna farmacias pueden ofrecer precios ms baratos.  El sitio web www.goodrx.com tiene cupones para medicamentos de Health and safety inspector. Los precios aqu no tienen en cuenta lo que podra  costar con la ayuda del seguro (puede ser ms barato con su seguro), pero el sitio web puede darle el precio si no utiliz Tourist information centre manager.  - Puede imprimir el cupn correspondiente y llevarlo con su receta a la farmacia.  - Tambin puede pasar por nuestra oficina durante el horario de atencin regular y Education officer, museum una tarjeta de cupones de GoodRx.  - Si necesita que su receta se enve electrnicamente a una farmacia diferente, informe a nuestra oficina a travs de MyChart de [place] o por telfono llamando al [phone] y presione la opcin 4.

## 2023-03-02 NOTE — Progress Notes (Signed)
Follow-Up Visit   Subjective  Casey Gallegos is a 88 y.o. male who presents for the following: Patient here with wife today concerning a sore spot at right lower leg states vein specialist recommended to have looked at, several spots at back he would like checked, and rash like area at buttocks.   The following portions of the chart were reviewed this encounter and updated as appropriate: medications, allergies, medical history  Review of Systems:  No other skin or systemic complaints except as noted in HPI or Assessment and Plan.  Objective  Well appearing patient in no apparent distress; mood and affect are within normal limits.   A focused examination was performed of the following areas: Left lower leg, back, buttock  Relevant exam findings are noted in the Assessment and Plan.    Assessment & Plan    Lipodermatosclerosis   Exam: 4.5 x 2.5 cm violaceous plaque tender to touch  at right lateral lower leg  2 + pitting edema bilateral lower legs   Chronic and persistent condition with duration or expected duration over one year. Condition is bothersome/symptomatic for patient. Currently flared.   Lipodermatosclerosis is a chronic inflammatory condition of unknown cause of the subcutaneous fat causing tenderness, discoloration and hardening of the involved skin, most commonly on the lower legs. Discussed that it may progress and gradually worsen over time, especially in the setting of chronic leg swelling. Daily compression stockings/hose is recommended.  Treatment Plan:  Start TMC 0.1 % cream apply topically to aa bid for up to 4 weeks. Avoid applying to face, groin, and axilla. Use as directed. Long-term use can cause thinning of the skin.  Topical steroids (such as triamcinolone, fluocinolone, fluocinonide, mometasone, clobetasol, halobetasol, betamethasone, hydrocortisone) can cause thinning and lightening of the skin if they are used for too long in the same area. Your  physician has selected the right strength medicine for your problem and area affected on the body. Please use your medication only as directed by your physician to prevent side effects.   After 2-4 weeks, start Tacrolimus 0.1 % ointment apply topically  to affected areas bid  Recommend daily compression socks to lower legs  Will recheck in 4 weeks   Lichen Simplex Chronicus (LSC)   Exam: lichenified scaly plaque with small erosion at medial buttocks at lower gluteal cleft   Chronic and persistent condition with duration or expected duration over one year. Condition is bothersome/symptomatic for patient. Currently flared.   Treatment Plan:  Continue desitin/vaseline/aquaphor barrier cream to aa daily Start tacrolimus 0.1 % ointment - apply to aa bid  Avoid scratching/rubbing area   SEBORRHEIC KERATOSIS - Stuck-on, waxy, tan-brown papules and/or plaques  - Benign-appearing - Discussed benign etiology and prognosis. - Observe - Call for any changes  LENTIGINES Exam: scattered tan macules Due to sun exposure Treatment Plan: Benign-appearing, observe. Recommend daily broad spectrum sunscreen SPF 30+ to sun-exposed areas, reapply every 2 hours as needed.  Call for any changes  MELANOCYTIC NEVI Exam: Tan-brown and/or pink-flesh-colored symmetric macules and papules, including R medial cheek Treatment Plan: Benign appearing on exam today. Recommend observation. Call clinic for new or changing moles. Recommend daily use of broad spectrum spf 30+ sunscreen to sun-exposed areas.  Nevus  Exam:  Right posterior axilla 3 x 2 mm medium dark brown macule   Benign-appearing.  Observation.  Call clinic for new or changing lesions.  Recommend daily use of broad spectrum spf 30+ sunscreen to sun-exposed areas.  HEMANGIOMA Exam: red papule(s) Discussed benign nature. Recommend observation. Call for changes.   Dilated Pore of [place]   Exam: dilated pore at spinal mid back  Treatment  Plan: Benign. Observe.  No treatment recommended Discussed surgical removal if symptomatic   Return for august 1 follow up .  I, Asher Muir, CMA, am acting as scribe for Willeen Niece, MD.   Documentation: I have reviewed the above documentation for accuracy and completeness, and I agree with the above.  Willeen Niece, MD

## 2023-03-05 ENCOUNTER — Other Ambulatory Visit: Payer: Self-pay | Admitting: Family Medicine

## 2023-03-05 DIAGNOSIS — I10 Essential (primary) hypertension: Secondary | ICD-10-CM

## 2023-03-05 NOTE — Telephone Encounter (Signed)
Requested Prescriptions  Pending Prescriptions Disp Refills   triamterene-hydrochlorothiazide (MAXZIDE-25) 37.5-25 MG tablet [Pharmacy Med Name: TRIAMTERENE/HYDROCHLOROTHIAZIDE 37.5-25 MG Tablet] 45 tablet 1    Sig: TAKE 1/2 TABLET EVERY DAY     Cardiovascular: Diuretic Combos Failed - 03/05/2023  2:37 AM      Failed - Last BP in normal range    BP Readings from Last 1 Encounters:  03/02/23 (!) 140/60         Passed - K in normal range and within 180 days    Potassium  Date Value Ref Range Status  02/13/2023 4.2 3.5 - 5.1 mmol/L Final         Passed - Na in normal range and within 180 days    Sodium  Date Value Ref Range Status  02/13/2023 139 135 - 145 mmol/L Final  04/22/2022 141 134 - 144 mmol/L Final         Passed - Cr in normal range and within 180 days    Creatinine, Ser  Date Value Ref Range Status  02/13/2023 0.99 0.61 - 1.24 mg/dL Final         Passed - Valid encounter within last 6 months    Recent Outpatient Visits           1 week ago Annual physical exam   [facility] Simmons-Robinson, Tawanna Cooler, MD   1 month ago Chronic venous insufficiency   [place] [facility] Simmons-Robinson, [place], MD   6 months ago Essential (primary) hypertension   [place] [facility] [place], [place], MD   10 months ago Weight loss, unintentional   [facility] Bosie Clos, MD   1 year ago Weight loss   [facility] Bosie Clos, MD       Future Appointments             In 3 weeks Willeen Niece, MD [facility]

## 2023-03-11 ENCOUNTER — Ambulatory Visit (INDEPENDENT_AMBULATORY_CARE_PROVIDER_SITE_OTHER): Payer: Medicare HMO

## 2023-03-11 DIAGNOSIS — I480 Paroxysmal atrial fibrillation: Secondary | ICD-10-CM | POA: Diagnosis not present

## 2023-03-11 LAB — POCT INR
INR: 2.3 (ref 2.0–3.0)
POC INR: 2.3
PT: 27.2

## 2023-03-11 NOTE — Patient Instructions (Signed)
Description   5 mg daily except 4 mg on M - F. F/ U 4weeks

## 2023-03-13 ENCOUNTER — Inpatient Hospital Stay: Payer: Medicare HMO

## 2023-03-13 ENCOUNTER — Inpatient Hospital Stay: Payer: Medicare HMO | Attending: Oncology

## 2023-03-13 ENCOUNTER — Encounter: Payer: Self-pay | Admitting: Oncology

## 2023-03-13 ENCOUNTER — Inpatient Hospital Stay: Payer: Medicare HMO | Admitting: Oncology

## 2023-03-13 ENCOUNTER — Inpatient Hospital Stay (HOSPITAL_BASED_OUTPATIENT_CLINIC_OR_DEPARTMENT_OTHER): Payer: Medicare HMO | Admitting: Oncology

## 2023-03-13 VITALS — BP 123/65 | HR 67 | Temp 97.5°F | Ht 69.0 in | Wt 152.9 lb

## 2023-03-13 VITALS — BP 116/84 | HR 62

## 2023-03-13 DIAGNOSIS — Z5112 Encounter for antineoplastic immunotherapy: Secondary | ICD-10-CM

## 2023-03-13 DIAGNOSIS — C3412 Malignant neoplasm of upper lobe, left bronchus or lung: Secondary | ICD-10-CM | POA: Insufficient documentation

## 2023-03-13 DIAGNOSIS — N400 Enlarged prostate without lower urinary tract symptoms: Secondary | ICD-10-CM | POA: Insufficient documentation

## 2023-03-13 LAB — CBC WITH DIFFERENTIAL/PLATELET
Abs Immature Granulocytes: 0.02 10*3/uL (ref 0.00–0.07)
Basophils Absolute: 0 10*3/uL (ref 0.0–0.1)
Basophils Relative: 1 %
Eosinophils Absolute: 0.1 10*3/uL (ref 0.0–0.5)
Eosinophils Relative: 3 %
HCT: 30.3 % — ABNORMAL LOW (ref 39.0–52.0)
Hemoglobin: 10.3 g/dL — ABNORMAL LOW (ref 13.0–17.0)
Immature Granulocytes: 1 %
Lymphocytes Relative: 13 %
Lymphs Abs: 0.6 10*3/uL — ABNORMAL LOW (ref 0.7–4.0)
MCH: 33.8 pg (ref 26.0–34.0)
MCHC: 34 g/dL (ref 30.0–36.0)
MCV: 99.3 fL (ref 80.0–100.0)
Monocytes Absolute: 0.5 10*3/uL (ref 0.1–1.0)
Monocytes Relative: 11 %
Neutro Abs: 3 10*3/uL (ref 1.7–7.7)
Neutrophils Relative %: 71 %
Platelets: 110 10*3/uL — ABNORMAL LOW (ref 150–400)
RBC: 3.05 MIL/uL — ABNORMAL LOW (ref 4.22–5.81)
RDW: 14.8 % (ref 11.5–15.5)
WBC: 4.3 10*3/uL (ref 4.0–10.5)
nRBC: 0 % (ref 0.0–0.2)

## 2023-03-13 LAB — COMPREHENSIVE METABOLIC PANEL
ALT: 17 U/L (ref 0–44)
AST: 19 U/L (ref 15–41)
Albumin: 3.7 g/dL (ref 3.5–5.0)
Alkaline Phosphatase: 50 U/L (ref 38–126)
Anion gap: 6 (ref 5–15)
BUN: 22 mg/dL (ref 8–23)
CO2: 25 mmol/L (ref 22–32)
Calcium: 8.4 mg/dL — ABNORMAL LOW (ref 8.9–10.3)
Chloride: 108 mmol/L (ref 98–111)
Creatinine, Ser: 1.01 mg/dL (ref 0.61–1.24)
GFR, Estimated: >60 mL/min (ref >60)
Glucose, Bld: 100 mg/dL — ABNORMAL HIGH (ref 70–99)
Potassium: 3.9 mmol/L (ref 3.5–5.1)
Sodium: 139 mmol/L (ref 135–145)
Total Bilirubin: 1 mg/dL (ref 0.3–1.2)
Total Protein: 6 g/dL — ABNORMAL LOW (ref 6.5–8.1)

## 2023-03-13 MED ORDER — HEPARIN SOD (PORK) LOCK FLUSH 100 UNIT/ML IV SOLN
500.0000 [IU] | Freq: Once | INTRAVENOUS | Status: AC | PRN
  Administered 2023-03-13: 500 [IU]
  Filled 2023-03-13: qty 5

## 2023-03-13 MED ORDER — SODIUM CHLORIDE 0.9 % IV SOLN
Freq: Once | INTRAVENOUS | Status: AC
  Filled 2023-03-13: qty 250

## 2023-03-13 MED ORDER — SODIUM CHLORIDE 0.9 % IV SOLN
1500.0000 mg | Freq: Once | INTRAVENOUS | Status: AC
  Administered 2023-03-13: 1500 mg via INTRAVENOUS
  Filled 2023-03-13: qty 30

## 2023-03-13 MED ORDER — SODIUM CHLORIDE 0.9% FLUSH
10.0000 mL | INTRAVENOUS | Status: DC | PRN
  Administered 2023-03-13: 10 mL
  Filled 2023-03-13: qty 10

## 2023-03-13 NOTE — Patient Instructions (Signed)
[facility] [place] [facility]  Discharge Instructions: Thank you for choosing [facility] to provide your oncology and hematology care.  If you have a lab appointment with the Cancer Center, please go directly to the Cancer Center and check in at the registration area.  Wear comfortable clothing and clothing appropriate for easy access to any Portacath or PICC line.   We strive to give you quality time with your provider. You may need to reschedule your appointment if you arrive late (15 or more minutes).  Arriving late affects you and other patients whose appointments are after yours.  Also, if you miss three or more appointments without notifying the office, you may be dismissed from the clinic at the provider's discretion.      For prescription refill requests, have your pharmacy contact our office and allow 72 hours for refills to be completed.    Today you received the following chemotherapy and/or immunotherapy agents: durvalumab    To help prevent nausea and vomiting after your treatment, we encourage you to take your nausea medication as directed.  BELOW ARE SYMPTOMS THAT SHOULD BE REPORTED IMMEDIATELY: *FEVER GREATER THAN 100.4 F (38 C) OR HIGHER *CHILLS OR SWEATING *NAUSEA AND VOMITING THAT IS NOT CONTROLLED WITH YOUR NAUSEA MEDICATION *UNUSUAL SHORTNESS OF BREATH *UNUSUAL BRUISING OR BLEEDING *URINARY PROBLEMS (pain or burning when urinating, or frequent urination) *BOWEL PROBLEMS (unusual diarrhea, constipation, pain near the anus) TENDERNESS IN MOUTH AND THROAT WITH OR WITHOUT PRESENCE OF ULCERS (sore throat, sores in mouth, or a toothache) UNUSUAL RASH, SWELLING OR PAIN  UNUSUAL VAGINAL DISCHARGE OR ITCHING   Items with * indicate a potential emergency and should be followed up as soon as possible or go to the Emergency Department if any problems should occur.  Please show the CHEMOTHERAPY ALERT CARD or IMMUNOTHERAPY ALERT CARD at check-in to  the Emergency Department and triage nurse.  Should you have questions after your visit or need to cancel or reschedule your appointment, please contact [place] [facility] [place] [facility]  [phone] and follow the prompts.  Office hours are 8:00 a.m. to 4:30 p.m. Monday - Friday. Please note that voicemails left after 4:00 p.m. may not be returned until the following business day.  We are closed weekends and major holidays. You have access to a nurse at all times for urgent questions. Please call the main number to the clinic [phone] and follow the prompts.  For any non-urgent questions, you may also contact your provider using MyChart. We now offer e-Visits for anyone 18 and older to request care online for non-urgent symptoms. For details visit [email or web address].   Also download the MyChart app! Go to the app store, search "MyChart", open the app, select [place], and log in with your MyChart username and password.    

## 2023-03-13 NOTE — Progress Notes (Signed)
Hematology/Oncology Consult note [facility]  [phone] [phone]  Patient Care Team: Ronnald Ramp, MD as PCP - General (Family Medicine) Vanna Scotland, MD as Consulting Physician (Urology) Lamar Blinks, MD as Consulting Physician (Cardiology) Lockie Mola, MD as Referring Physician (Ophthalmology) Deirdre Evener, MD (Dermatology) Glory Buff, RN as Oncology Nurse Navigator Creig Hines, MD as Consulting Physician (Oncology) Salena Saner, MD as Consulting Physician (Pulmonary Disease)   Name of the [name]  [id number]  November 12, 1934   Date of visit: 03/13/23  Diagnosis- 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  He was found to have a left upper lobe lung mass measuring 5.5 x 3.7 x 2.8 cm.  It was confluent with the left hilar lymph node.  Enlarged AP window lymph node measuring 16 mm.  Enlarged left paratracheal lymph node.  Low-attenuation 7 mm lesion in the liver.  7 mm cyst in the uncinate process of the pancreas for which follow-up was not recommended.  Bilateral kidney cysts.  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  Prostatic enlargement.   PET CT is can showed hypermetabolic left upper lobe lung mass measuring 5.4 x 3.9 cm with an SUV of 15.1.  Ipsilateral mediastinal lymph node metastases with index lymph node measuring 1.7 with an SUV of 9.8.  No evidence of distant metastatic disease.  Biopsy of the left upper lobe as well as station 4R lymph node was consistent with squamous cell carcinoma   NGS testing did not show any actionable mutations.  PD-L1 35%.   MSI stable.  MRI brain was negative for metastatic disease   Patient completed concurrent chemoradiation with weekly CarboTaxol chemotherapy in December 2023.  Scans following that showed partial response to treatment.  Patient started adjuvant durvalumab in January 2024    Interval history- tolerating treatment well so far. Denies any complaints at this time  ECOG PS- 1 Pain scale- 0   Review of systems- Review of Systems  Constitutional:  Negative for chills, fever, malaise/fatigue and weight loss.  HENT:  Negative for congestion, ear discharge and nosebleeds.   Eyes:  Negative for blurred vision.  Respiratory:  Negative for cough, hemoptysis, sputum production, shortness of breath and wheezing.   Cardiovascular:  Negative for chest pain, palpitations, orthopnea and claudication.  Gastrointestinal:  Negative for abdominal pain, blood in stool, constipation, diarrhea, heartburn, melena, nausea and vomiting.  Genitourinary:  Negative for dysuria, flank pain, frequency, hematuria and urgency.  Musculoskeletal:  Negative for back pain, joint pain and myalgias.  Skin:  Negative for rash.  Neurological:  Negative for dizziness, tingling, focal weakness, seizures, weakness and headaches.  Endo/Heme/Allergies:  Does not bruise/bleed easily.  Psychiatric/Behavioral:  Negative for depression and suicidal ideas. The patient does not have insomnia.       Allergies  Allergen Reactions   Cefdinir Nausea Only    hypersensitivity to smell   Doxycycline     Sun sensitivity   Prednisone     Hallucinations    Sertraline Other (See Comments)    Hallucinations      Past Medical History:  Diagnosis Date   A-fib (HCC)  a.) CHA2DS2-VASc = 6 (age x2, HTN, DVT x2, vascular disease history). b.) rate/rhythm maintained on oral carvedilol; chronically anticoagulated using daily warfarin.   Actinic keratosis    Anemia    Aortic atherosclerosis (HCC)    Bilateral renal cysts    a.)  CT abdomen  pelvis 04/24/2022: Enhancing septation of the lateral aspect of the mid RIGHT kidney measuring 2.7 x 2.8 cm concerning for cystic renal neoplasm   Bladder calculi    BPH (benign prostatic hyperplasia)    DDD (degenerative disc disease), lumbar    a.) s/p LEFT laminectomy L5   Diverticulosis    DOE (dyspnea on exertion)    DVT (deep venous thrombosis) (HCC)    Elevated PSA    GERD (gastroesophageal reflux disease)    H/O degenerative disc disease    HLD (hyperlipidemia)    Hypertension    Lipoma of arm    Lipomatosis    Long term current use of anticoagulant    a.) warfarin   Lung cancer (HCC)    Mass of upper lobe of left lung 04/24/2022   a.)  CT chest 04/24/2022: Multilobulated perihilar mass measuring 5.3 x 3.7 x 2.8 cm with associated LEFT hilar and mediastinal LAD; b.)  PET CT 05/07/2022: Hypermetabolic LEFT upper lobe lung mass (max SUV 15.1); ipsilateral nodal metastasis --> presuming a NSC histology, 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   NICM (nonischemic cardiomyopathy) (HCC) 11/22/2015   a.) TTE 11/22/2015: EF 40%. b.) TTE 12/27/2018: EF 45%. c.) TTE 08/30/2020: EF 45-50%.   Night sweats    Obesity    Osteoarthritis    RBBB (right bundle branch block)    Right inguinal hernia    VHD (valvular heart disease) 11/22/2015   a.) TTE 11/22/2015: EF 40%; mod MR, mod-sev TR, triv PR; mod BAE, mild RV enlargement. b.) TTE 12/27/2018: EF 45%; mod MR, mod-sev TR, triv PR; mod BAE, mild RV enlargement. c.) TTE 08/30/2020: EF 45-50%; mild MR/TR.     Past Surgical History:  Procedure Laterality Date   Bilateral Radical Keratotomy Bilateral 1997   COLONOSCOPY WITH PROPOFOL N/A 01/11/2016   Procedure: COLONOSCOPY WITH PROPOFOL;  Surgeon: Scot Jun, MD;  Location: [facility] ENDOSCOPY;  Service: Endoscopy;  Laterality: N/A; repeat 3 years, tubular adenoma   DENTAL SURGERY     Patient had implants   FLEXIBLE BRONCHOSCOPY Left 05/19/2022    Procedure: FLEXIBLE BRONCHOSCOPY;  Surgeon: Salena Saner, MD;  Location: [facility] ORS;  Service: Cardiopulmonary;  Laterality: Left;   GUM SURGERY     IR IMAGING GUIDED PORT INSERTION  06/13/2022   L4 - S1 decompression Left    PROSTATE BIOPSY     TONSILLECTOMY AND ADENOIDECTOMY  age 63   VIDEO BRONCHOSCOPY WITH ENDOBRONCHIAL ULTRASOUND Left 05/19/2022   Procedure: VIDEO BRONCHOSCOPY WITH ENDOBRONCHIAL ULTRASOUND;  Surgeon: Salena Saner, MD;  Location: [facility] ORS;  Service: Cardiopulmonary;  Laterality: Left;   XI ROBOTIC ASSISTED INGUINAL HERNIA REPAIR WITH MESH Right 10/21/2021   Procedure: XI ROBOTIC ASSISTED INGUINAL HERNIA REPAIR WITH MESH;  Surgeon: Campbell Lerner, MD;  Location: [facility] ORS;  Service: General;  Laterality: Right;    Social History   Socioeconomic History   Marital status: Married    Spouse name: Doris   Number of children: 3   Years of education: Not on file   Highest education level: Associate degree: occupational, Scientist, product/process development, or vocational program  Occupational History   Occupation: retired  Tobacco Use  Smoking status: Former    Current packs/day: 0.00    Types: Cigarettes    Quit date: 08/26/1971    Years since quitting: 51.5    Passive exposure: Never   Smokeless tobacco: Never   Tobacco comments:    Smoked for about 20 years.  Vaping Use   Vaping status: Never Used  Substance and Sexual Activity   Alcohol use: Not Currently   Drug use: No   Sexual activity: Not Currently  Other Topics Concern   Not on file  Social History Narrative   Not on file   Social Determinants of Health   Financial Resource Strain: Low Risk  (12/23/2022)   Overall Financial Resource Strain (CARDIA)    Difficulty of Paying Living Expenses: Not hard at all  Food Insecurity: No Food Insecurity (12/23/2022)   Hunger Vital Sign    Worried About Running Out of Food in the Last Year: Never true    Ran Out of Food in the Last Year: Never true  Transportation Needs: No  Transportation Needs (12/23/2022)   PRAPARE - Administrator, Civil Service (Medical): No    Lack of Transportation (Non-Medical): No  Physical Activity: Sufficiently Active (12/23/2022)   Exercise Vital Sign    Days of Exercise per Week: 7 days    Minutes of Exercise per Session: 50 min  Stress: No Stress Concern Present (12/18/2021)   [employer] of Occupational Health - Occupational Stress Questionnaire    Feeling of Stress : Only a little  Social Connections: Moderately Integrated (12/23/2022)   Social Connection and Isolation Panel [NHANES]    Frequency of Communication with Friends and Family: Twice a week    Frequency of Social Gatherings with Friends and Family: Once a week    Attends Religious Services: More than 4 times per year    Active Member of [employer] or Organizations: No    Attends Banker Meetings: Never    Marital Status: Married  Catering manager Violence: Not At Risk (12/23/2022)   Humiliation, Afraid, Rape, and Kick questionnaire    Fear of Current or Ex-Partner: No    Emotionally Abused: No    Physically Abused: No    Sexually Abused: No    Family History  Problem Relation Age of Onset   Cancer Mother        secondary to cancinoma of the jaw from her dipping snuff.   Heart attack Father    CAD Father    Hypertension Sister    Diabetes Son        Type I diabetes   Prostate cancer Neg Hx    Kidney cancer Neg Hx      Current Outpatient Medications:    amLODipine (NORVASC) 5 MG tablet, TAKE 1 TABLET (5 MG TOTAL) BY MOUTH DAILY., Disp: 90 tablet, Rfl: 3   benazepril (LOTENSIN) 40 MG tablet, Take 1 tablet (40 mg total) by mouth daily., Disp: 90 tablet, Rfl: 1   carvedilol (COREG) 6.25 MG tablet, TAKE 1 TABLET (6.25 MG TOTAL) BY MOUTH 2 (TWO) TIMES DAILY., Disp: 180 tablet, Rfl: 3   doxazosin (CARDURA) 8 MG tablet, TAKE 1 TABLET (8 MG TOTAL) BY MOUTH DAILY., Disp: 90 tablet, Rfl: 3   finasteride (PROSCAR) 5 MG tablet, TAKE 1 TABLET  EVERY DAY, Disp: 90 tablet, Rfl: 3   gabapentin (NEURONTIN) 300 MG capsule, TAKE 1 CAPSULE EVERY MORNING, 1 CAPSULE IN THE AFTERNOON AND 2 CAPSULES AT BEDTIME, Disp: 360 capsule, Rfl: 2  Magnesium 500 MG TABS, Take 500 mg by mouth daily., Disp: , Rfl:    Multiple Vitamin (MULTI-VITAMIN) tablet, Take 1 tablet by mouth daily. , Disp: , Rfl:    oxyCODONE (OXY IR/ROXICODONE) 5 MG immediate release tablet, Take 1 tablet (5 mg total) by mouth every 6 (six) hours as needed for severe pain., Disp: 90 tablet, Rfl: 0   tacrolimus (PROTOPIC) 0.1 % ointment, Apply topically 2 (two) times daily. Apply as directed. Refer to patient print out, Disp: 60 g, Rfl: 2   triamcinolone cream (KENALOG) 0.1 %, Apply topically to affected area of right lower leg twice daily for 4 weeks. Avoid applying to face, groin, and axilla. Use as directed., Disp: 45 g, Rfl: 0   triamterene-hydrochlorothiazide (MAXZIDE-25) 37.5-25 MG tablet, TAKE 1/2 TABLET EVERY DAY, Disp: 45 tablet, Rfl: 1   warfarin (COUMADIN) 5 MG tablet, Take 1 tablet (5 mg total) by mouth daily., Disp: 90 tablet, Rfl: 1   Cyanocobalamin (VITAMIN B 12 PO), Take 1 capsule by mouth 2 (two) times daily. 1000 mcg each capsule (Patient not taking: Reported on 02/23/2023), Disp: , Rfl:    omeprazole (PRILOSEC) 20 MG capsule, Take 20 mg by mouth daily. (Patient not taking: Reported on 02/23/2023), Disp: , Rfl:  No current facility-administered medications for this visit.  Facility-Administered Medications Ordered in Other Visits:    durvalumab (IMFINZI) 1,500 mg in sodium chloride 0.9 % 100 mL chemo infusion, 1,500 mg, Intravenous, Once, Creig Hines, MD   heparin lock flush 100 UNIT/ML injection, , , ,    heparin lock flush 100 unit/mL, 500 Units, Intracatheter, Once PRN, Creig Hines, MD   sodium chloride flush (NS) 0.9 % injection 10 mL, 10 mL, Intracatheter, PRN, Creig Hines, MD, 10 mL at 03/13/23 1232  Physical exam:  Vitals:   03/13/23 1139  BP: 123/65   Pulse: 67  Temp: (!) 97.5 F (36.4 C)  TempSrc: Tympanic  Weight: 152 lb 14.4 oz (69.4 kg)  Height: 5\' 9"  (1.753 m)   Physical Exam Cardiovascular:     Rate and Rhythm: Normal rate and regular rhythm.     Heart sounds: Normal heart sounds.  Pulmonary:     Effort: Pulmonary effort is normal.     Breath sounds: Normal breath sounds.  Abdominal:     General: Bowel sounds are normal.     Palpations: Abdomen is soft.  Skin:    General: Skin is warm and dry.  Neurological:     Mental Status: He is alert and oriented to person, place, and time.         Latest Ref Rng & Units 03/13/2023   11:31 AM  CMP  Glucose 70 - 99 mg/dL 130   BUN 8 - 23 mg/dL 22   Creatinine 8.65 - 1.24 mg/dL 7.84   Sodium 696 - 295 mmol/L 139   Potassium 3.5 - 5.1 mmol/L 3.9   Chloride 98 - 111 mmol/L 108   CO2 22 - 32 mmol/L 25   Calcium 8.9 - 10.3 mg/dL 8.4   Total Protein 6.5 - 8.1 g/dL 6.0   Total Bilirubin 0.3 - 1.2 mg/dL 1.0   Alkaline Phos 38 - 126 U/L 50   AST 15 - 41 U/L 19   ALT 0 - 44 U/L 17       Latest Ref Rng & Units 03/13/2023   11:31 AM  CBC  WBC 4.0 - 10.5 K/uL 4.3   Hemoglobin 13.0 - 17.0 g/dL 10.3  Hematocrit 39.0 - 52.0 % 30.3   Platelets 150 - 400 K/uL 110     No images are attached to the encounter.  VAS [place] LOWER EXTREMITY VENOUS REFLUX  Result Date: 02/23/2023  Lower Venous Reflux Study Patient Name:  Casey Gallegos  Date of Exam:   02/20/2023 Medical Rec #: [id number]     Accession #:    [id number] Date of Birth: Dec 11, 1934     Patient Gender: M Patient Age:   36 years Exam Location:  [facility] Procedure:      VAS [place] LOWER EXTREMITY VENOUS REFLUX Referring Phys: Sheppard Plumber --------------------------------------------------------------------------------  Indications: Rt lateral distal calf sore.  Performing Technologist: Salvadore Farber RVT  Examination Guidelines: A complete evaluation includes B-mode imaging, spectral Doppler, color Doppler, and power Doppler as  needed of all accessible portions of each vessel. Bilateral testing is considered an integral part of a complete examination. Limited examinations for reoccurring indications may be performed as noted. The reflux portion of the exam is performed with the patient in reverse Trendelenburg. Significant venous reflux is defined as >500 ms in the superficial venous system, and >1 second in the deep venous system.  Venous Reflux Times +-------------+---------+------+-----------+------------+--------+ RIGHT        Reflux NoRefluxReflux TimeDiameter cmsComments                        Yes                                  +-------------+---------+------+-----------+------------+--------+ CFV                    yes   >1 second                      +-------------+---------+------+-----------+------------+--------+ FV mid                 yes   >1 second                      +-------------+---------+------+-----------+------------+--------+ Popliteal              yes   >1 second                      +-------------+---------+------+-----------+------------+--------+ SSV Pop Fossa                 >500 ms      .25              +-------------+---------+------+-----------+------------+--------+   Summary: Right: - No evidence of deep vein thrombosis seen in the right lower extremity, from the common femoral through the popliteal veins. - No evidence of superficial venous thrombosis in the right lower extremity. - No evidence of superficial venous reflux seen in the right greater saphenous vein. - No evidence of superficial venous reflux seen in the right short saphenous vein. - Venous reflux is noted in the right common femoral vein. - Venous reflux is noted in the right femoral vein. - Venous reflux is noted in the right popliteal vein. - Venous reflux is noted in the right short saphenous vein. - Rt distal PTA shows triphasic flow. Sore area shows moderate edematous tissue.  *See table(s)  above for measurements and observations. Electronically signed by Festus Barren MD on 02/23/2023 at 11:37:38 AM.    Final  Assessment and plan- Patient is a 88 y.o. male with history of stage III squamous cell carcinoma of the left upper lobe T3 N2 M0 s/p concurrent weekly CarboTaxol with radiation.  He had partial response to treatment. He is here for on treatment assessment prior to cycle 8 of adjuvant durvalumab  Counts ok to proceed with cycle 8 of survalumab today. I will see him 4 weeks for cycle 9 with scans prior. TSH to be added with todays labs or in 3 weeks. Plan is to complete 1 year of adjuvant treatment   Visit Diagnosis 1. Primary malignant neoplasm of left upper lobe of lung (HCC)   2. Encounter for antineoplastic immunotherapy      Dr. Owens Shark, MD, MPH [facility] [phone] 03/13/2023 12:45 PM

## 2023-03-13 NOTE — Patient Instructions (Signed)

## 2023-03-17 ENCOUNTER — Telehealth: Payer: Self-pay | Admitting: Oncology

## 2023-03-17 ENCOUNTER — Other Ambulatory Visit: Payer: Self-pay | Admitting: *Deleted

## 2023-03-17 DIAGNOSIS — C3412 Malignant neoplasm of upper lobe, left bronchus or lung: Secondary | ICD-10-CM

## 2023-03-17 NOTE — Telephone Encounter (Signed)
Pt called stating he is supposed to have a CT scheduled from Dr.Rao. I told him that there was no CT scheduled and no CT order from Dr.Rao, but there is an MRI order. Pt stated he was not aware of any MRI to be scheduled and that he didn't want this as this is the cancer center and it did not seem like there should be an MRI...  Pt would like a nurse to call him to clarify with him about what is to be scheduled and answer any questions he has.   Thank you

## 2023-03-17 NOTE — Telephone Encounter (Signed)
He is right. There is no need for mri brain. He only needs ct chest abdomen plevis with contrast in the next 1-2 weeks

## 2023-03-25 DIAGNOSIS — I482 Chronic atrial fibrillation, unspecified: Secondary | ICD-10-CM | POA: Diagnosis not present

## 2023-03-25 DIAGNOSIS — I420 Dilated cardiomyopathy: Secondary | ICD-10-CM | POA: Diagnosis not present

## 2023-03-25 DIAGNOSIS — C3412 Malignant neoplasm of upper lobe, left bronchus or lung: Secondary | ICD-10-CM | POA: Diagnosis not present

## 2023-03-25 DIAGNOSIS — G629 Polyneuropathy, unspecified: Secondary | ICD-10-CM | POA: Diagnosis not present

## 2023-03-26 ENCOUNTER — Ambulatory Visit (INDEPENDENT_AMBULATORY_CARE_PROVIDER_SITE_OTHER): Payer: Medicare HMO | Admitting: Dermatology

## 2023-03-26 VITALS — BP 142/91 | HR 57

## 2023-03-26 DIAGNOSIS — M793 Panniculitis, unspecified: Secondary | ICD-10-CM | POA: Diagnosis not present

## 2023-03-26 DIAGNOSIS — L280 Lichen simplex chronicus: Secondary | ICD-10-CM

## 2023-03-26 MED ORDER — BETAMETHASONE DIPROPIONATE 0.05 % EX CREA: TOPICAL_CREAM | CUTANEOUS | 0 refills | Status: DC

## 2023-03-26 NOTE — Patient Instructions (Addendum)
Stop Triamcinolone and start Betamethasone Diproprionate cream twice daily for two weeks. Use no more than 4 weeks.   Start over the counter Diclofenac (Voltaren gel) twice daily as needed OR a topical lidocaine or benzocaine cream PRN.   Lipodermatosclerosis is a chronic inflammatory condition of unknown cause of the subcutaneous fat causing tenderness, discoloration and hardening of the involved skin, most commonly on the lower legs. Discussed that it may progress and gradually worsen over time, especially in the setting of chronic leg swelling. Daily compression stockings/hose is recommended.  Due to recent changes in healthcare laws, you may see results of your pathology and/or laboratory studies on MyChart before the doctors have had a chance to review them. We understand that in some cases there may be results that are confusing or concerning to you. Please understand that not all results are received at the same time and often the doctors may need to interpret multiple results in order to provide you with the best plan of care or course of treatment. Therefore, we ask that you please give [place] 2 business days to thoroughly review all your results before contacting the office for clarification. Should we see a critical lab result, you will be contacted sooner.   If You Need Anything After Your Visit  If you have any questions or concerns for your doctor, please call our main line at [phone] and press option 4 to reach your doctor's medical assistant. If no one answers, please leave a voicemail as directed and we will return your call as soon as possible. Messages left after 4 pm will be answered the following business day.   You may also send [place] a message via MyChart. We typically respond to MyChart messages within 1-2 business days.  For prescription refills, please ask your pharmacy to contact our office. Our fax number is [phone].  If you have an urgent issue when the clinic is  closed that cannot wait until the next business day, you can page your doctor at the number below.    Please note that while we do our best to be available for urgent issues outside of office hours, we are not available 24/7.   If you have an urgent issue and are unable to reach [place], you may choose to seek medical care at your doctor's office, retail clinic, urgent care center, or emergency room.  If you have a medical emergency, please immediately call 911 or go to the emergency department.  Pager Numbers  - Dr. Gwen Pounds: [phone]  - Dr. Roseanne Reno: [phone]  In the event of inclement weather, please call our main line at [phone] for an update on the status of any delays or closures.  Dermatology Medication Tips: Please keep the boxes that topical medications come in in order to help keep track of the instructions about where and how to use these. Pharmacies typically print the medication instructions only on the boxes and not directly on the medication tubes.   If your medication is too expensive, please contact our office at [phone] option 4 or send [place] a message through MyChart.   We are unable to tell what your co-pay for medications will be in advance as this is different depending on your insurance coverage. However, we may be able to find a substitute medication at lower cost or fill out paperwork to get insurance to cover a needed medication.   If a prior authorization is required to get your medication covered by your insurance company, please allow  [place] 1-2 business days to complete this process.  Drug prices often vary depending on where the prescription is filled and some pharmacies may offer cheaper prices.  The website www.goodrx.com contains coupons for medications through different pharmacies. The prices here do not account for what the cost may be with help from insurance (it may be cheaper with your insurance), but the website can give you the price if you did  not use any insurance.  - You can print the associated coupon and take it with your prescription to the pharmacy.  - You may also stop by our office during regular business hours and pick up a GoodRx coupon card.  - If you need your prescription sent electronically to a different pharmacy, notify our office through [facility] or by phone at [phone] option 4.     Si Usted Necesita Algo Despus de Su Visita  Tambin puede enviarnos un mensaje a travs de Clinical cytogeneticist. Por lo general respondemos a los mensajes de MyChart en el transcurso de 1 a 2 das hbiles.  Para renovar recetas, por favor pida a su farmacia que se ponga en contacto con nuestra oficina. Annie Sable de fax es [place] [phone].  Si tiene un asunto urgente cuando la clnica est cerrada y que no puede esperar hasta el siguiente da hbil, puede llamar/localizar a su doctor(a) al nmero que aparece a continuacin.   Por favor, tenga en cuenta que aunque hacemos todo lo posible para estar disponibles para asuntos urgentes fuera del horario de [place], no estamos disponibles las 24 horas del da, los 7 [address]  [address] de la [place].   Si tiene un problema urgente y no puede comunicarse con nosotros, puede optar por buscar atencin mdica  en el consultorio de su doctor(a), en una clnica privada, en un centro de atencin urgente o en una sala de emergencias.  Si tiene Engineer, drilling, por favor llame inmediatamente al 911 o vaya a la sala de emergencias.  Nmeros de bper  - Dr. Gwen Pounds: [phone]  - Dra. Roseanne Reno: [phone]  En caso de inclemencias del [place], por favor llame a Lacy Duverney principal al [phone] para una actualizacin sobre el [place] de cualquier retraso o cierre.  Consejos para la medicacin en dermatologa: Por favor, guarde las cajas en las que vienen los medicamentos de uso tpico para ayudarle a seguir las instrucciones sobre dnde y cmo usarlos. Las farmacias generalmente imprimen las  instrucciones del medicamento slo en las cajas y no directamente en los tubos del [place].   Si su medicamento es muy caro, por favor, pngase en contacto con Rolm Gala llamando al [phone] y presione la opcin 4 o envenos un mensaje a travs de Clinical cytogeneticist.   No podemos decirle cul ser su copago por los medicamentos por adelantado ya que esto es diferente dependiendo de la cobertura de su seguro. Sin embargo, es posible que podamos encontrar un medicamento sustituto a Audiological scientist un formulario para que el seguro cubra el medicamento que se considera necesario.   Si se requiere una autorizacin previa para que su compaa de seguros [place] su medicamento, por favor permtanos de 1 a 2 das hbiles para completar [address]  Los precios de los medicamentos varan con frecuencia dependiendo del Environmental consultant de dnde se surte la receta y alguna farmacias pueden ofrecer precios ms baratos.  El sitio web www.goodrx.com tiene cupones para medicamentos de Health and safety inspector. Los precios aqu no tienen en cuenta lo que podra costar con la ayuda del  seguro (puede ser ms barato con su seguro), pero el sitio web puede darle el precio si no Visual merchandiser.  - Puede imprimir el cupn correspondiente y llevarlo con su receta a la farmacia.  - Tambin puede pasar por nuestra oficina durante el horario de atencin regular y Education officer, museum una tarjeta de cupones de GoodRx.  - Si necesita que su receta se enve electrnicamente a una farmacia diferente, informe a nuestra oficina a travs de MyChart de [place] o por telfono llamando al [phone] y presione la opcin 4.

## 2023-03-26 NOTE — Progress Notes (Signed)
   Follow-Up Visit   Subjective  Casey Gallegos is a 88 y.o. male who presents for the following: LSC of the medial buttocks at lower gluteal cleft improved with Tacrolimus ointment and OTC barrier cream, but Tacrolimus was $95.00. Lipodermatosclerosis of the R lower leg, patient has noticed no improvement using TMC 0.1%. Still very tender to touch.  The following portions of the chart were reviewed this encounter and updated as appropriate: medications, allergies, medical history  Review of Systems:  No other skin or systemic complaints except as noted in HPI or Assessment and Plan.  Objective  Well appearing patient in no apparent distress; mood and affect are within normal limits.   A focused examination was performed of the following areas: the face, legs, and buttocks   Relevant exam findings are noted in the Assessment and Plan.    Assessment & Plan   Lipodermatosclerosis    Exam: 4.5 x 2.5 cm violaceous plaque tender to touch  at right lateral lower leg   Chronic and persistent condition with duration or expected duration over one year. Condition is bothersome/symptomatic for patient. Currently flared.    Lipodermatosclerosis is a chronic inflammatory condition of unknown cause of the subcutaneous fat causing tenderness, discoloration and hardening of the involved skin, most commonly on the lower legs. Discussed that it may progress and gradually worsen over time, especially in the setting of chronic leg swelling. Daily compression stockings/hose is recommended.   Treatment Plan:   D/C TMC.  Start Betamethasone dipropionate cream to aa's BID up to 2-4 weeks. Caution skin atrophy with long-term use.  After switch to Tacrolimus 0.1% ointment QD-BID PRN.  Continue daily compression stockings Apply diclofenac (voltaren) gel twice a day to spots and/or topical lidocaine/benzocaine prn pain.    Topical steroids (such as triamcinolone, fluocinolone, fluocinonide, mometasone,  clobetasol, halobetasol, betamethasone, hydrocortisone) can cause thinning and lightening of the skin if they are used for too long in the same area. Your physician has selected the right strength medicine for your problem and area affected on the body. Please use your medication only as directed by your physician to prevent side effects.   Lichen Simplex Chronicus (LSC)    Exam: lichenified scaly plaque with small erosion at medial buttocks at lower gluteal cleft at previous visit. Pt declines exam today- says it has improved   Chronic and persistent condition with duration or expected duration over one year. Condition is improving with treatment but not currently at goal.    Treatment Plan:   Continue desitin/vaseline/aquaphor barrier cream to aa daily  Continue tacrolimus 0.1 % ointment - apply to aa bid. Avoid scratching/rubbing area  Recommend donut pillows when sitting.  Or cushioned seat  Return in about 3 months (around 06/26/2023).  Maylene Roes, CMA, am acting as scribe for Willeen Niece, MD .  Documentation: I have reviewed the above documentation for accuracy and completeness, and I agree with the above.  Willeen Niece, MD

## 2023-03-29 ENCOUNTER — Other Ambulatory Visit: Payer: Self-pay | Admitting: Family Medicine

## 2023-03-30 ENCOUNTER — Ambulatory Visit
Admission: RE | Admit: 2023-03-30 | Discharge: 2023-03-30 | Disposition: A | Discharge status: Home / Self Care | Payer: Medicare HMO | Source: Ambulatory Visit | Attending: Oncology | Admitting: Oncology

## 2023-03-30 DIAGNOSIS — C3412 Malignant neoplasm of upper lobe, left bronchus or lung: Secondary | ICD-10-CM | POA: Diagnosis not present

## 2023-03-30 DIAGNOSIS — I700 Atherosclerosis of aorta: Secondary | ICD-10-CM | POA: Diagnosis not present

## 2023-03-30 DIAGNOSIS — J90 Pleural effusion, not elsewhere classified: Secondary | ICD-10-CM | POA: Diagnosis not present

## 2023-03-30 MED ORDER — IOHEXOL 300 MG/ML  SOLN
100.0000 mL | Freq: Once | INTRAMUSCULAR | Status: AC | PRN
  Administered 2023-03-30: 100 mL via INTRAVENOUS

## 2023-03-30 NOTE — Telephone Encounter (Signed)
Requested medications are due for refill today.  yes  Requested medications are on the active medications list.  yes  Last refill. 11/03/2022 #90 1   Future visit scheduled.   no  Notes to clinic.  Provider to review.    Requested Prescriptions  Pending Prescriptions Disp Refills   warfarin (COUMADIN) 5 MG tablet [Pharmacy Med Name: WARFARIN SODIUM 5 MG Tablet] 90 tablet 3    Sig: TAKE 1 TABLET EVERY DAY     Hematology:  Anticoagulants - warfarin Failed - 03/29/2023  7:52 AM      Failed - Manual Review: If patient's warfarin is managed by Anti-Coag team, route request to them. If not, route request to the provider.      Failed - HCT in normal range and within 360 days    HCT  Date Value Ref Range Status  03/13/2023 30.3 (L) 39.0 - 52.0 % Final   Hematocrit  Date Value Ref Range Status  04/22/2022 37.3 (L) 37.5 - 51.0 % Final         Passed - INR in normal range and within 30 days    POC INR  Date Value Ref Range Status  03/11/2023 2.3  Final   INR  Date Value Ref Range Status  03/11/2023 2.3 2.0 - 3.0 Final         Passed - Patient is not pregnant      Passed - Valid encounter within last 3 months    Recent Outpatient Visits           1 month ago Annual physical exam   [facility] Simmons-Robinson, [place], MD   2 months ago Chronic venous insufficiency   [place] [facility] Simmons-Robinson, [place], MD   7 months ago Essential (primary) hypertension   [place] [facility] [place], [place], MD   11 months ago Weight loss, unintentional   [facility] Bosie Clos, MD   1 year ago Weight loss   [place] Bosie Clos, MD       Future Appointments             In 3 months Willeen Niece, MD [facility]

## 2023-04-07 ENCOUNTER — Other Ambulatory Visit: Payer: Self-pay | Admitting: Family Medicine

## 2023-04-07 DIAGNOSIS — G629 Polyneuropathy, unspecified: Secondary | ICD-10-CM

## 2023-04-08 ENCOUNTER — Ambulatory Visit: Payer: Medicare HMO

## 2023-04-10 ENCOUNTER — Inpatient Hospital Stay: Payer: Medicare HMO

## 2023-04-10 ENCOUNTER — Encounter: Payer: Self-pay | Admitting: Oncology

## 2023-04-10 ENCOUNTER — Inpatient Hospital Stay (HOSPITAL_BASED_OUTPATIENT_CLINIC_OR_DEPARTMENT_OTHER): Payer: Medicare HMO | Admitting: Oncology

## 2023-04-10 VITALS — BP 141/76 | HR 51 | Temp 95.5°F | Resp 18 | Ht 69.0 in | Wt 153.5 lb

## 2023-04-10 DIAGNOSIS — Z5112 Encounter for antineoplastic immunotherapy: Secondary | ICD-10-CM | POA: Insufficient documentation

## 2023-04-10 DIAGNOSIS — Z7189 Other specified counseling: Secondary | ICD-10-CM

## 2023-04-10 DIAGNOSIS — R599 Enlarged lymph nodes, unspecified: Secondary | ICD-10-CM | POA: Diagnosis not present

## 2023-04-10 DIAGNOSIS — C3412 Malignant neoplasm of upper lobe, left bronchus or lung: Secondary | ICD-10-CM

## 2023-04-10 DIAGNOSIS — Z79620 Long term (current) use of immunosuppressive biologic: Secondary | ICD-10-CM | POA: Insufficient documentation

## 2023-04-10 DIAGNOSIS — N281 Cyst of kidney, acquired: Secondary | ICD-10-CM | POA: Diagnosis not present

## 2023-04-10 DIAGNOSIS — N400 Enlarged prostate without lower urinary tract symptoms: Secondary | ICD-10-CM | POA: Diagnosis not present

## 2023-04-10 DIAGNOSIS — C771 Secondary and unspecified malignant neoplasm of intrathoracic lymph nodes: Secondary | ICD-10-CM | POA: Insufficient documentation

## 2023-04-10 LAB — CBC WITH DIFFERENTIAL/PLATELET
Abs Immature Granulocytes: 0.01 10*3/uL (ref 0.00–0.07)
Basophils Absolute: 0 10*3/uL (ref 0.0–0.1)
Basophils Relative: 1 %
Eosinophils Absolute: 0.1 10*3/uL (ref 0.0–0.5)
Eosinophils Relative: 3 %
HCT: 31.1 % — ABNORMAL LOW (ref 39.0–52.0)
Hemoglobin: 10.2 g/dL — ABNORMAL LOW (ref 13.0–17.0)
Immature Granulocytes: 0 %
Lymphocytes Relative: 15 %
Lymphs Abs: 0.5 10*3/uL — ABNORMAL LOW (ref 0.7–4.0)
MCH: 33.8 pg (ref 26.0–34.0)
MCHC: 32.8 g/dL (ref 30.0–36.0)
MCV: 103 fL — ABNORMAL HIGH (ref 80.0–100.0)
Monocytes Absolute: 0.4 10*3/uL (ref 0.1–1.0)
Monocytes Relative: 12 %
Neutro Abs: 2.1 10*3/uL (ref 1.7–7.7)
Neutrophils Relative %: 69 %
Platelets: 102 10*3/uL — ABNORMAL LOW (ref 150–400)
RBC: 3.02 MIL/uL — ABNORMAL LOW (ref 4.22–5.81)
RDW: 14.7 % (ref 11.5–15.5)
WBC: 3.1 10*3/uL — ABNORMAL LOW (ref 4.0–10.5)
nRBC: 0 % (ref 0.0–0.2)

## 2023-04-10 LAB — COMPREHENSIVE METABOLIC PANEL
ALT: 15 U/L (ref 0–44)
AST: 19 U/L (ref 15–41)
Albumin: 3.5 g/dL (ref 3.5–5.0)
Alkaline Phosphatase: 53 U/L (ref 38–126)
Anion gap: 5 (ref 5–15)
BUN: 21 mg/dL (ref 8–23)
CO2: 24 mmol/L (ref 22–32)
Calcium: 8.4 mg/dL — ABNORMAL LOW (ref 8.9–10.3)
Chloride: 110 mmol/L (ref 98–111)
Creatinine, Ser: 1 mg/dL (ref 0.61–1.24)
GFR, Estimated: >60 mL/min (ref >60)
Glucose, Bld: 104 mg/dL — ABNORMAL HIGH (ref 70–99)
Potassium: 3.9 mmol/L (ref 3.5–5.1)
Sodium: 139 mmol/L (ref 135–145)
Total Bilirubin: 1 mg/dL (ref 0.3–1.2)
Total Protein: 5.7 g/dL — ABNORMAL LOW (ref 6.5–8.1)

## 2023-04-10 LAB — TSH: TSH: 2.092 u[IU]/mL (ref 0.350–4.500)

## 2023-04-10 MED ORDER — SODIUM CHLORIDE 0.9 % IV SOLN
1500.0000 mg | Freq: Once | INTRAVENOUS | Status: AC
  Administered 2023-04-10: 1500 mg via INTRAVENOUS
  Filled 2023-04-10: qty 30

## 2023-04-10 MED ORDER — SODIUM CHLORIDE 0.9 % IV SOLN
Freq: Once | INTRAVENOUS | Status: AC
  Filled 2023-04-10: qty 250

## 2023-04-10 MED ORDER — HEPARIN SOD (PORK) LOCK FLUSH 100 UNIT/ML IV SOLN
500.0000 [IU] | Freq: Once | INTRAVENOUS | Status: AC | PRN
  Administered 2023-04-10: 500 [IU]
  Filled 2023-04-10: qty 5

## 2023-04-10 NOTE — Progress Notes (Signed)
Hematology/Oncology Consult note [facility]  [phone] [phone]) [phone]  Patient Care Team: Ronnald Ramp, MD as PCP - General (Family Medicine) Vanna Scotland, MD as Consulting Physician (Urology) Lamar Blinks, MD as Consulting Physician (Cardiology) Lockie Mola, MD as Referring Physician (Ophthalmology) Deirdre Evener, MD (Dermatology) Glory Buff, RN as Oncology Nurse Navigator Creig Hines, MD as Consulting Physician (Oncology) Salena Saner, MD as Consulting Physician (Pulmonary Disease)   Name of the [name]  [id number]  06/03/1935   Date of visit: 04/10/23  Diagnosis- 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  He was found to have a left upper lobe lung mass measuring 5.5 x 3.7 x 2.8 cm.  It was confluent with the left hilar lymph node.  Enlarged AP window lymph node measuring 16 mm.  Enlarged left paratracheal lymph node.  Low-attenuation 7 mm lesion in the liver.  7 mm cyst in the uncinate process of the pancreas for which follow-up was not recommended.  Bilateral kidney cysts.  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  Prostatic enlargement.   PET CT is can showed hypermetabolic left upper lobe lung mass measuring 5.4 x 3.9 cm with an SUV of 15.1.  Ipsilateral mediastinal lymph node metastases with index lymph node measuring 1.7 with an SUV of 9.8.  No evidence of distant metastatic disease.  Biopsy of the left upper lobe as well as station 4R lymph node was consistent with squamous cell carcinoma   NGS testing did not show any actionable mutations.  PD-L1 35%.  MSI  stable.  MRI brain was negative for metastatic disease   Patient completed concurrent chemoradiation with weekly CarboTaxol chemotherapy in December 2023.  Scans following that showed partial response to treatment.  Patient started adjuvant durvalumab in January 2024    Interval history-occasional nonproductive cough which is overall self-limited especially when he drinks liquids.  Denies other complaints at this time  ECOG PS- 1 Pain scale- 0   Review of systems- Review of Systems  Constitutional:  Negative for chills, fever, malaise/fatigue and weight loss.  HENT:  Negative for congestion, ear discharge and nosebleeds.   Eyes:  Negative for blurred vision.  Respiratory:  Positive for cough. Negative for hemoptysis, sputum production, shortness of breath and wheezing.   Cardiovascular:  Negative for chest pain, palpitations, orthopnea and claudication.  Gastrointestinal:  Negative for abdominal pain, blood in stool, constipation, diarrhea, heartburn, melena, nausea and vomiting.  Genitourinary:  Negative for dysuria, flank pain, frequency, hematuria and urgency.  Musculoskeletal:  Negative for back pain, joint pain and myalgias.  Skin:  Negative for rash.  Neurological:  Negative for dizziness, tingling, focal weakness, seizures, weakness and headaches.  Endo/Heme/Allergies:  Does not bruise/bleed easily.  Psychiatric/Behavioral:  Negative for depression and suicidal ideas. The patient does not have insomnia.       Allergies  Allergen Reactions   Cefdinir Nausea Only    hypersensitivity to smell   Doxycycline     Sun sensitivity   Prednisone     Hallucinations    Sertraline Other (See Comments)    Hallucinations      Past Medical History:  Diagnosis Date   A-fib [facility])    a.) CHA2DS2-VASc = 6 (age x2, HTN, DVT x2, vascular disease history). b.) rate/rhythm maintained on oral carvedilol; chronically anticoagulated using daily warfarin.   Actinic keratosis    Anemia     Aortic atherosclerosis (HCC)    Bilateral renal cysts    a.)  CT abdomen pelvis 04/24/2022: Enhancing septation of the lateral aspect of the mid RIGHT kidney measuring 2.7 x 2.8 cm concerning for cystic renal neoplasm   Bladder calculi    BPH (benign prostatic hyperplasia)    DDD (degenerative disc disease), lumbar    a.) s/p LEFT laminectomy L5   Diverticulosis    DOE (dyspnea on exertion)    DVT (deep venous thrombosis) (HCC)    Elevated PSA    GERD (gastroesophageal reflux disease)    H/O degenerative disc disease    HLD (hyperlipidemia)    Hypertension    Lipoma of arm    Lipomatosis    Long term current use of anticoagulant    a.) warfarin   Lung cancer (HCC)    Mass of upper lobe of left lung 04/24/2022   a.)  CT chest 04/24/2022: Multilobulated perihilar mass measuring 5.3 x 3.7 x 2.8 cm with associated LEFT hilar and mediastinal LAD; b.)  PET CT 05/07/2022: Hypermetabolic LEFT upper lobe lung mass (max SUV 15.1); ipsilateral nodal metastasis --> presuming a NSC histology, 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   NICM (nonischemic cardiomyopathy) (HCC) 11/22/2015   a.) TTE 11/22/2015: EF 40%. b.) TTE 12/27/2018: EF 45%. c.) TTE 08/30/2020: EF 45-50%.   Night sweats    Obesity    Osteoarthritis    RBBB (right bundle branch block)    Right inguinal hernia    VHD (valvular heart disease) 11/22/2015   a.) TTE 11/22/2015: EF 40%; mod MR, mod-sev TR, triv PR; mod BAE, mild RV enlargement. b.) TTE 12/27/2018: EF 45%; mod MR, mod-sev TR, triv PR; mod BAE, mild RV enlargement. c.) TTE 08/30/2020: EF 45-50%; mild MR/TR.     Past Surgical History:  Procedure Laterality Date   Bilateral Radical Keratotomy Bilateral 1997   COLONOSCOPY WITH PROPOFOL N/A 01/11/2016   Procedure: COLONOSCOPY WITH PROPOFOL;  Surgeon: Scot Jun, MD;  Location: [facility] ENDOSCOPY;  Service: Endoscopy;  Laterality: N/A; repeat 3 years, tubular adenoma   DENTAL  SURGERY     Patient had implants   FLEXIBLE BRONCHOSCOPY Left 05/19/2022   Procedure: FLEXIBLE BRONCHOSCOPY;  Surgeon: Salena Saner, MD;  Location: [facility] ORS;  Service: Cardiopulmonary;  Laterality: Left;   GUM SURGERY     IR IMAGING GUIDED PORT INSERTION  06/13/2022   L4 - S1 decompression Left    PROSTATE BIOPSY     TONSILLECTOMY AND ADENOIDECTOMY  age 62   VIDEO BRONCHOSCOPY WITH ENDOBRONCHIAL ULTRASOUND Left 05/19/2022   Procedure: VIDEO BRONCHOSCOPY WITH ENDOBRONCHIAL ULTRASOUND;  Surgeon: Salena Saner, MD;  Location: [facility] ORS;  Service: Cardiopulmonary;  Laterality: Left;   XI ROBOTIC ASSISTED INGUINAL HERNIA REPAIR WITH MESH Right 10/21/2021   Procedure: XI ROBOTIC ASSISTED INGUINAL HERNIA REPAIR WITH MESH;  Surgeon: Campbell Lerner, MD;  Location: [facility] ORS;  Service: General;  Laterality: Right;    Social History   Socioeconomic History   Marital status: Married    Spouse name: Doris   Number of children: 3   Years of education: Not on file   Highest education level: Associate degree: occupational, Scientist, product/process development, or vocational program  Occupational History   Occupation: retired  Tobacco Use   Smoking status: Former    Current packs/day: 0.00    Types: Cigarettes    Quit date: 08/26/1971    Years since quitting: 51.6    Passive exposure: Never   Smokeless tobacco: Never   Tobacco comments:    Smoked for about 20 years.  Vaping Use   Vaping status: Never Used  Substance and Sexual Activity   Alcohol use: Not Currently   Drug use: No   Sexual activity: Not Currently  Other Topics Concern   Not on file  Social History Narrative   Not on file   Social Determinants of Health   Financial Resource Strain: Low Risk  (03/20/2023)   Received from [facility]   Overall Financial Resource Strain (CARDIA)    Difficulty of Paying Living Expenses: Not very hard  Food Insecurity: No Food Insecurity (03/20/2023)   Received from [facility]   Hunger Vital Sign    Worried About Running Out of Food in the Last Year: Never true    Ran Out of Food in the Last Year: Never true  Transportation Needs: No Transportation Needs (03/20/2023)   Received from [facility] - Transportation    In the past 12 months, has lack of transportation kept you from medical appointments or from getting medications?: No    Lack of Transportation (Non-Medical): No  Physical Activity: Sufficiently Active (12/23/2022)   Exercise Vital Sign    Days of Exercise per Week: 7 days    Minutes of Exercise per Session: 50 min  Stress: No Stress Concern Present (12/18/2021)   [employer] of Occupational Health - Occupational Stress Questionnaire    Feeling of Stress : Only a little  Social Connections: Moderately Integrated (12/23/2022)   Social Connection and Isolation Panel [NHANES]    Frequency of Communication with Friends and Family: Twice a week    Frequency of Social Gatherings with Friends and Family: Once a week    Attends Religious Services: More than 4 times per year    Active Member of [employer] or Organizations: No    Attends Banker Meetings: Never    Marital Status: Married  Catering manager Violence: Not At Risk (12/23/2022)   Humiliation, Afraid, Rape, and Kick questionnaire    Fear of Current or Ex-Partner: No    Emotionally Abused: No    Physically Abused: No    Sexually Abused: No    Family History  Problem Relation Age of Onset   Cancer Mother        secondary to cancinoma of the jaw from her dipping snuff.   Heart attack Father    CAD Father    Hypertension Sister    Diabetes Son        Type I diabetes   Prostate cancer Neg Hx    Kidney cancer Neg Hx      Current Outpatient Medications:    amLODipine (NORVASC) 5 MG tablet, TAKE 1 TABLET (5 MG TOTAL) BY MOUTH DAILY., Disp: 90 tablet, Rfl: 3   benazepril (LOTENSIN) 40 MG tablet, Take 1 tablet (40 mg total) by mouth daily., Disp:  90 tablet, Rfl: 1   betamethasone dipropionate 0.05 % cream, Apply to aa BID PRN. Use no more than 4 weeks., Disp: 45 g, Rfl: 0   carvedilol (COREG) 6.25 MG tablet, TAKE 1 TABLET (6.25 MG TOTAL) BY MOUTH 2 (TWO) TIMES DAILY., Disp:  180 tablet, Rfl: 3   Cyanocobalamin (VITAMIN B 12 PO), Take 1 capsule by mouth 2 (two) times daily. 1000 mcg each capsule, Disp: , Rfl:    doxazosin (CARDURA) 8 MG tablet, TAKE 1 TABLET (8 MG TOTAL) BY MOUTH DAILY., Disp: 90 tablet, Rfl: 3   finasteride (PROSCAR) 5 MG tablet, TAKE 1 TABLET EVERY DAY, Disp: 90 tablet, Rfl: 3   gabapentin (NEURONTIN) 300 MG capsule, TAKE 1 CAPSULE EVERY MORNING, 1 CAPSULE IN THE AFTERNOON AND 2 CAPSULES AT BEDTIME, Disp: 360 capsule, Rfl: 3   Magnesium 500 MG TABS, Take 500 mg by mouth daily., Disp: , Rfl:    Multiple Vitamin (MULTI-VITAMIN) tablet, Take 1 tablet by mouth daily. , Disp: , Rfl:    omeprazole (PRILOSEC) 20 MG capsule, Take 20 mg by mouth daily., Disp: , Rfl:    oxyCODONE (OXY IR/ROXICODONE) 5 MG immediate release tablet, Take 1 tablet (5 mg total) by mouth every 6 (six) hours as needed for severe pain., Disp: 90 tablet, Rfl: 0   tacrolimus (PROTOPIC) 0.1 % ointment, Apply topically 2 (two) times daily. Apply as directed. Refer to patient print out, Disp: 60 g, Rfl: 2   triamcinolone cream (KENALOG) 0.1 %, Apply topically to affected area of right lower leg twice daily for 4 weeks. Avoid applying to face, groin, and axilla. Use as directed., Disp: 45 g, Rfl: 0   triamterene-hydrochlorothiazide (MAXZIDE-25) 37.5-25 MG tablet, TAKE 1/2 TABLET EVERY DAY, Disp: 45 tablet, Rfl: 1   warfarin (COUMADIN) 5 MG tablet, TAKE 1 TABLET EVERY DAY, Disp: 90 tablet, Rfl: 3 No current facility-administered medications for this visit.  Facility-Administered Medications Ordered in Other Visits:    heparin lock flush 100 UNIT/ML injection, , , ,   Physical exam:  Vitals:   04/10/23 0853 04/10/23 0859  BP: (!) 163/81 (!) 141/76  Pulse: 63  (!) 51  Resp: 18   Temp: (!) 95.5 F (35.3 C)   TempSrc: Tympanic   SpO2: 100%   Weight: 153 lb 8 oz (69.6 kg)   Height: 5\' 9"  (1.753 m)    Physical Exam Cardiovascular:     Rate and Rhythm: Normal rate and regular rhythm.     Heart sounds: Normal heart sounds.  Pulmonary:     Effort: Pulmonary effort is normal.     Breath sounds: Normal breath sounds.  Skin:    General: Skin is warm and dry.  Neurological:     Mental Status: He is alert and oriented to person, place, and time.         Latest Ref Rng & Units 04/10/2023    8:43 AM  CMP  Glucose 70 - 99 mg/dL 409   BUN 8 - 23 mg/dL 21   Creatinine 8.11 - 1.24 mg/dL 9.14   Sodium 782 - 956 mmol/L 139   Potassium 3.5 - 5.1 mmol/L 3.9   Chloride 98 - 111 mmol/L 110   CO2 22 - 32 mmol/L 24   Calcium 8.9 - 10.3 mg/dL 8.4   Total Protein 6.5 - 8.1 g/dL 5.7   Total Bilirubin 0.3 - 1.2 mg/dL 1.0   Alkaline Phos 38 - 126 U/L 53   AST 15 - 41 U/L 19   ALT 0 - 44 U/L 15       Latest Ref Rng & Units 04/10/2023    8:43 AM  CBC  WBC 4.0 - 10.5 K/uL 3.1   Hemoglobin 13.0 - 17.0 g/dL 21.3   Hematocrit 08.6 - 52.0 %  31.1   Platelets 150 - 400 K/uL 102     No images are attached to the encounter.  CT CHEST ABDOMEN PELVIS W CONTRAST  Result Date: 03/31/2023 CLINICAL DATA:  Left upper lobe lung cancer restaging * Tracking Code: BO * EXAM: CT CHEST, ABDOMEN, AND PELVIS WITH CONTRAST TECHNIQUE: Multidetector CT imaging of the chest, abdomen and pelvis was performed following the standard protocol during bolus administration of intravenous contrast. RADIATION DOSE REDUCTION: This exam was performed according to the departmental dose-optimization program which includes automated exposure control, adjustment of the mA and/or kV according to patient size and/or use of iterative reconstruction technique. CONTRAST:  OMNIPAQUE IOHEXOL 300 MG/ML  SOLN COMPARISON:  11/19/2022 FINDINGS: CT CHEST FINDINGS Cardiovascular: Right chest port  catheter. Aortic atherosclerosis. Cardiomegaly. Three-vessel coronary artery calcifications. No pericardial effusion. Mediastinum/Nodes: No persistently enlarged mediastinal, hilar, or axillary lymph nodes. Thyroid gland, trachea, and esophagus demonstrate no significant findings. Lungs/Pleura: Unchanged mass of the medial left upper lobe measuring 5.1 x 1.7 cm, with adjacent bandlike post treatment/post radiation fibrosis and consolidation of the perihilar left lung (series 4, image 64). Mild centrilobular emphysema. Diffuse bilateral bronchial wall thickening. Unchanged small left pleural effusion. Musculoskeletal: No chest wall abnormality. No acute osseous findings. CT ABDOMEN PELVIS FINDINGS Hepatobiliary: No solid liver abnormality is seen. Unchanged small hyperenhancing lesions of the inferior right lobe of the liver, hepatic segment VI, likely small flash filling hemangiomata (series 2, image 80). Contracted gallbladder. No gallstones, gallbladder wall thickening, or biliary dilatation. Pancreas: Unremarkable. No pancreatic ductal dilatation or surrounding inflammatory changes. Spleen: Normal in size without significant abnormality. Adrenals/Urinary Tract: Adrenal glands are unremarkable. Unchanged, thickly septated mixed solid and cystic exophytic lesion of the peripheral superior pole of the right kidney measuring 3.1 x 2.6 cm (series 2, image 67). Multiple additional simple and thinly septated benign bilateral renal cortical cysts. Kidneys are otherwise normal, without renal calculi, solid lesion, or hydronephrosis. Unchanged tiny calculi within the bladder (series 2, image 108). Stomach/Bowel: Stomach is within normal limits. Appendix appears normal. No evidence of bowel wall thickening, distention, or inflammatory changes. Moderate burden of stool throughout the colon and rectum. Vascular/Lymphatic: Aortic atherosclerosis. No enlarged abdominal or pelvic lymph nodes. Reproductive: Severe prostatomegaly.  Other: No abdominal wall hernia or abnormality. No ascites. Musculoskeletal: No acute osseous findings. IMPRESSION: 1. Unchanged mass of the medial left upper lobe, with adjacent bandlike post treatment/post radiation fibrosis and consolidation of the perihilar left lung. Unchanged small left pleural effusion. 2. No evidence of persistent lymphadenopathy or metastatic disease in the chest, abdomen, or pelvis. 3. 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 4. Unchanged small hyperenhancing lesions of the inferior right lobe of the liver, likely small flash filling hemangiomata. Attention on follow-up. 5. Emphysema and diffuse bilateral bronchial wall thickening. 6. Severe prostatomegaly. 7. Coronary artery disease. Aortic Atherosclerosis (ICD10-I70.0) and Emphysema (ICD10-J43.9). Electronically Signed   By: Jearld Lesch M.D.   On: 03/31/2023 07:16     Assessment and plan- Patient is a 88 y.o. male with history of stage III squamous cell carcinoma of the left upper lobe T3 N2 M0 s/p concurrent weekly CarboTaxol with radiation.  He had partial response to treatment.  He is here to discuss CT scan results and cycle 7 of adjuvant durvalumab  I have reviewed CT chest abdomen and pelvis images independently  and discussed findings with the patient.  Scans overall show stable radiation changes in the left upper lobe and no evidence of recurrent or progressive disease.  Right renal mass possible RCC has also been stable.  He has seen urology in the past and plan was continued surveillance for the same.  I offered him speech pathology referral given that he complains of cough when drinking liquids.  States that he has seen them in the past as well and does not wish to see them again at this time.  Counts otherwise okay to proceed with cycle  7 of adjuvant durvalumab today and I will see him back in 4 weeks for cycle 8   Visit Diagnosis 1. Primary malignant neoplasm of left upper lobe of lung (HCC)   2. Encounter for antineoplastic immunotherapy      Dr. Owens Shark, MD, MPH [facility] at [facility] [phone] 04/10/2023 12:13 PM

## 2023-04-10 NOTE — Patient Instructions (Signed)
[facility] [place] [facility]  Discharge Instructions: Thank you for choosing [facility] to provide your oncology and hematology care.  If you have a lab appointment with the Cancer Center, please go directly to the Cancer Center and check in at the registration area.  Wear comfortable clothing and clothing appropriate for easy access to any Portacath or PICC line.   We strive to give you quality time with your provider. You may need to reschedule your appointment if you arrive late (15 or more minutes).  Arriving late affects you and other patients whose appointments are after yours.  Also, if you miss three or more appointments without notifying the office, you may be dismissed from the clinic at the provider's discretion.      For prescription refill requests, have your pharmacy contact our office and allow 72 hours for refills to be completed.    Today you received the following chemotherapy and/or immunotherapy agents Imfinzi        To help prevent nausea and vomiting after your treatment, we encourage you to take your nausea medication as directed.  BELOW ARE SYMPTOMS THAT SHOULD BE REPORTED IMMEDIATELY: *FEVER GREATER THAN 100.4 F (38 C) OR HIGHER *CHILLS OR SWEATING *NAUSEA AND VOMITING THAT IS NOT CONTROLLED WITH YOUR NAUSEA MEDICATION *UNUSUAL SHORTNESS OF BREATH *UNUSUAL BRUISING OR BLEEDING *URINARY PROBLEMS (pain or burning when urinating, or frequent urination) *BOWEL PROBLEMS (unusual diarrhea, constipation, pain near the anus) TENDERNESS IN MOUTH AND THROAT WITH OR WITHOUT PRESENCE OF ULCERS (sore throat, sores in mouth, or a toothache) UNUSUAL RASH, SWELLING OR PAIN  UNUSUAL VAGINAL DISCHARGE OR ITCHING   Items with * indicate a potential emergency and should be followed up as soon as possible or go to the Emergency Department if any problems should occur.  Please show the CHEMOTHERAPY ALERT CARD or IMMUNOTHERAPY ALERT CARD at check-in to  the Emergency Department and triage nurse.  Should you have questions after your visit or need to cancel or reschedule your appointment, please contact [facility] [place] [facility]  [phone] and follow the prompts.  Office hours are 8:00 a.m. to 4:30 p.m. Monday - Friday. Please note that voicemails left after 4:00 p.m. may not be returned until the following business day.  We are closed weekends and major holidays. You have access to a nurse at all times for urgent questions. Please call the main number to the clinic [phone] and follow the prompts.  For any non-urgent questions, you may also contact your provider using MyChart. We now offer e-Visits for anyone 18 and older to request care online for non-urgent symptoms. For details visit [email or web address].   Also download the MyChart app! Go to the app store, search "MyChart", open the app, select [place], and log in with your MyChart username and password.    

## 2023-04-11 LAB — T4: T4, Total: 6.7 ug/dL (ref 4.5–12.0)

## 2023-04-22 DIAGNOSIS — I482 Chronic atrial fibrillation, unspecified: Secondary | ICD-10-CM | POA: Diagnosis not present

## 2023-04-24 ENCOUNTER — Ambulatory Visit (INDEPENDENT_AMBULATORY_CARE_PROVIDER_SITE_OTHER): Payer: Medicare HMO | Admitting: Vascular Surgery

## 2023-04-24 ENCOUNTER — Encounter (INDEPENDENT_AMBULATORY_CARE_PROVIDER_SITE_OTHER): Payer: Self-pay | Admitting: Vascular Surgery

## 2023-04-24 VITALS — BP 141/73 | HR 54 | Resp 16 | Wt 155.0 lb

## 2023-04-24 DIAGNOSIS — I420 Dilated cardiomyopathy: Secondary | ICD-10-CM | POA: Diagnosis not present

## 2023-04-24 DIAGNOSIS — I890 Lymphedema, not elsewhere classified: Secondary | ICD-10-CM

## 2023-04-24 DIAGNOSIS — I872 Venous insufficiency (chronic) (peripheral): Secondary | ICD-10-CM

## 2023-04-24 DIAGNOSIS — I480 Paroxysmal atrial fibrillation: Secondary | ICD-10-CM | POA: Diagnosis not present

## 2023-04-24 NOTE — Assessment & Plan Note (Signed)
Compression and elevation.  No role for intervention at this time.

## 2023-04-24 NOTE — Assessment & Plan Note (Signed)
Leg swelling from a combination of chronic lymphedema, multiple medical issues, and some venous disease.  Patient doing well with conservative therapy.  Will continue compression and elevation.  Follow-up in 1 year.

## 2023-04-24 NOTE — Progress Notes (Signed)
MRN : [id number]  Casey Gallegos is a 88 y.o. (August 01, 1935) male who presents with chief complaint of  Chief Complaint  Patient presents with   Follow-up    2 month follow up  .  History of Present Illness: Patient returns today in follow up of his leg swelling.  He was last seen about 2 months ago.  His legs are about the same as they were at that visit.  No new ulceration or infection.  No fevers or chills.  His legs are heavy and the outside of his right leg can be painful at times.  No chest pain or shortness of breath.  Current Outpatient Medications  Medication Sig Dispense Refill   amLODipine (NORVASC) 5 MG tablet TAKE 1 TABLET (5 MG TOTAL) BY MOUTH DAILY. 90 tablet 3   benazepril (LOTENSIN) 40 MG tablet Take 1 tablet (40 mg total) by mouth daily. 90 tablet 1   betamethasone dipropionate 0.05 % cream Apply to aa BID PRN. Use no more than 4 weeks. 45 g 0   carvedilol (COREG) 6.25 MG tablet TAKE 1 TABLET (6.25 MG TOTAL) BY MOUTH 2 (TWO) TIMES DAILY. 180 tablet 3   Cyanocobalamin (VITAMIN B 12 PO) Take 1 capsule by mouth 2 (two) times daily. 1000 mcg each capsule     doxazosin (CARDURA) 8 MG tablet TAKE 1 TABLET (8 MG TOTAL) BY MOUTH DAILY. 90 tablet 3   finasteride (PROSCAR) 5 MG tablet TAKE 1 TABLET EVERY DAY 90 tablet 3   gabapentin (NEURONTIN) 300 MG capsule TAKE 1 CAPSULE EVERY MORNING, 1 CAPSULE IN THE AFTERNOON AND 2 CAPSULES AT BEDTIME 360 capsule 3   Magnesium 500 MG TABS Take 500 mg by mouth daily.     Multiple Vitamin (MULTI-VITAMIN) tablet Take 1 tablet by mouth daily.      omeprazole (PRILOSEC) 20 MG capsule Take 20 mg by mouth daily.     oxyCODONE (OXY IR/ROXICODONE) 5 MG immediate release tablet Take 1 tablet (5 mg total) by mouth every 6 (six) hours as needed for severe pain. 90 tablet 0   tacrolimus (PROTOPIC) 0.1 % ointment Apply topically 2 (two) times daily. Apply as directed. Refer to patient print out 60 g 2   triamcinolone cream (KENALOG) 0.1 % Apply topically to  affected area of right lower leg twice daily for 4 weeks. Avoid applying to face, groin, and axilla. Use as directed. 45 g 0   triamterene-hydrochlorothiazide (MAXZIDE-25) 37.5-25 MG tablet TAKE 1/2 TABLET EVERY DAY 45 tablet 1   warfarin (COUMADIN) 5 MG tablet TAKE 1 TABLET EVERY DAY 90 tablet 3   No current facility-administered medications for this visit.   Facility-Administered Medications Ordered in Other Visits  Medication Dose Route Frequency Provider Last Rate Last Admin   heparin lock flush 100 UNIT/ML injection             Past Medical History:  Diagnosis Date   A-fib (HCC)    a.) CHA2DS2-VASc = 6 (age x2, HTN, DVT x2, vascular disease history). b.) rate/rhythm maintained on oral carvedilol; chronically anticoagulated using daily warfarin.   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   Bladder calculi    BPH (benign prostatic hyperplasia)    DDD (degenerative disc disease), lumbar  a.) s/p LEFT laminectomy L5   Diverticulosis    DOE (dyspnea on exertion)    DVT (deep venous thrombosis) (HCC)    Elevated PSA    GERD (gastroesophageal reflux disease)    H/O degenerative disc disease    HLD (hyperlipidemia)    Hypertension    Lipoma of arm    Lipomatosis    Long term current use of anticoagulant    a.) warfarin   Lung cancer (HCC)    Mass of upper lobe of left lung 04/24/2022   a.)  CT chest 04/24/2022: Multilobulated perihilar mass measuring 5.3 x 3.7 x 2.8 cm with associated LEFT hilar and mediastinal LAD; b.)  PET CT 05/07/2022: Hypermetabolic LEFT upper lobe lung mass (max SUV 15.1); ipsilateral nodal metastasis --> presuming a NSC histology, 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   NICM (nonischemic cardiomyopathy) (HCC)  11/22/2015   a.) TTE 11/22/2015: EF 40%. b.) TTE 12/27/2018: EF 45%. c.) TTE 08/30/2020: EF 45-50%.   Night sweats    Obesity    Osteoarthritis    RBBB (right bundle branch block)    Right inguinal hernia    VHD (valvular heart disease) 11/22/2015   a.) TTE 11/22/2015: EF 40%; mod MR, mod-sev TR, triv PR; mod BAE, mild RV enlargement. b.) TTE 12/27/2018: EF 45%; mod MR, mod-sev TR, triv PR; mod BAE, mild RV enlargement. c.) TTE 08/30/2020: EF 45-50%; mild MR/TR.    Past Surgical History:  Procedure Laterality Date   Bilateral Radical Keratotomy Bilateral 1997   COLONOSCOPY WITH PROPOFOL N/A 01/11/2016   Procedure: COLONOSCOPY WITH PROPOFOL;  Surgeon: Scot Jun, MD;  Location: [facility] ENDOSCOPY;  Service: Endoscopy;  Laterality: N/A; repeat 3 years, tubular adenoma   DENTAL SURGERY     Patient had implants   FLEXIBLE BRONCHOSCOPY Left 05/19/2022   Procedure: FLEXIBLE BRONCHOSCOPY;  Surgeon: Salena Saner, MD;  Location: [facility] ORS;  Service: Cardiopulmonary;  Laterality: Left;   GUM SURGERY     IR IMAGING GUIDED PORT INSERTION  06/13/2022   L4 - S1 decompression Left    PROSTATE BIOPSY     TONSILLECTOMY AND ADENOIDECTOMY  age 40   VIDEO BRONCHOSCOPY WITH ENDOBRONCHIAL ULTRASOUND Left 05/19/2022   Procedure: VIDEO BRONCHOSCOPY WITH ENDOBRONCHIAL ULTRASOUND;  Surgeon: Salena Saner, MD;  Location: [facility] ORS;  Service: Cardiopulmonary;  Laterality: Left;   XI ROBOTIC ASSISTED INGUINAL HERNIA REPAIR WITH MESH Right 10/21/2021   Procedure: XI ROBOTIC ASSISTED INGUINAL HERNIA REPAIR WITH MESH;  Surgeon: Campbell Lerner, MD;  Location: [facility] ORS;  Service: General;  Laterality: Right;     Social History   Tobacco Use   Smoking status: Former    Current packs/day: 0.00    Types: Cigarettes    Quit date: 08/26/1971    Years since quitting: 51.6    Passive exposure: Never   Smokeless tobacco: Never   Tobacco comments:    Smoked for about 20 years.  Vaping Use   Vaping status:  Never Used  Substance Use Topics   Alcohol use: Not Currently   Drug use: No       Family History  Problem Relation Age of Onset   Cancer Mother        secondary to cancinoma of the jaw from her dipping snuff.   Heart attack Father    CAD Father    Hypertension Sister    Diabetes Son        Type I diabetes  Prostate cancer Neg Hx    Kidney cancer Neg Hx      Allergies  Allergen Reactions   Cefdinir Nausea Only    hypersensitivity to smell   Doxycycline     Sun sensitivity   Prednisone     Hallucinations    Sertraline Other (See Comments)    Hallucinations      REVIEW OF SYSTEMS (Negative unless checked)  Constitutional: [] Weight loss  [] Fever  [] Chills Cardiac: [] Chest pain   [] Chest pressure   [x] Palpitations   [] Shortness of breath when laying flat   [] Shortness of breath at rest   [x] Shortness of breath with exertion. Vascular:  [] Pain in legs with walking   [] Pain in legs at rest   [] Pain in legs when laying flat   [] Claudication   [] Pain in feet when walking  [] Pain in feet at rest  [] Pain in feet when laying flat   [] History of DVT   [] Phlebitis   [x] Swelling in legs   [] Varicose veins   [] Non-healing ulcers Pulmonary:   [] Uses home oxygen   [] Productive cough   [] Hemoptysis   [] Wheeze  [] COPD   [] Asthma Neurologic:  [] Dizziness  [] Blackouts   [] Seizures   [] History of stroke   [] History of TIA  [] Aphasia   [] Temporary blindness   [] Dysphagia   [] Weakness or numbness in arms   [] Weakness or numbness in legs Musculoskeletal:  [] Arthritis   [] Joint swelling   [x] Joint pain   [] Low back pain Hematologic:  [] Easy bruising  [] Easy bleeding   [] Hypercoagulable state   [] Anemic   Gastrointestinal:  [] Blood in stool   [] Vomiting blood  [] Gastroesophageal reflux/heartburn   [] Abdominal pain Genitourinary:  [] Chronic kidney disease   [] Difficult urination  [] Frequent urination  [] Burning with urination   [] Hematuria Skin:  [] Rashes   [] Ulcers   [] Wounds Psychological:   [] History of anxiety   []  History of major depression.  Physical Examination  BP (!) 141/73 (BP Location: Left Arm)   Pulse (!) 54   Resp 16   Wt 155 lb (70.3 kg)   BMI 22.89 kg/m  Gen:  WD/WN, NAD.  Appears younger than stated age Head: [place]/AT, No temporalis wasting. Ear/Nose/Throat: Hearing grossly intact, nares w/o erythema or drainage Eyes: Conjunctiva clear. Sclera non-icteric Neck: Supple.  Trachea midline Pulmonary:  Good air movement, no use of accessory muscles.  Cardiac: Irregular Vascular:  Vessel Right Left  Radial Palpable Palpable       Musculoskeletal: M/S 5/5 throughout.  No deformity or atrophy.  1+ right lower extremity edema, 1-2+ left lower extremity edema. Neurologic: Sensation grossly intact in extremities.  Symmetrical.  Speech is fluent.  Psychiatric: Judgment intact, Mood & affect appropriate for pt's clinical situation. Dermatologic: No rashes or ulcers noted.  No cellulitis or open wounds.      Labs Recent Results (from the past 2160 hour(s))  POCT INR     Status: Abnormal   Collection Time: 02/04/23  1:33 PM  Result Value Ref Range   INR 1.9 (A) 2.0 - 3.0   POC INR 1.9    PT 23.1   Comprehensive metabolic panel     Status: Abnormal   Collection Time: 02/13/23 12:43 PM  Result Value Ref Range   Sodium 139 135 - 145 mmol/L   Potassium 4.2 3.5 - 5.1 mmol/L   Chloride 108 98 - 111 mmol/L   CO2 24 22 - 32 mmol/L   Glucose, Bld 93 70 - 99 mg/dL    Comment: Glucose  reference range applies only to samples taken after fasting for at least 8 hours.   BUN 23 8 - 23 mg/dL   Creatinine, Ser 4.09 0.61 - 1.24 mg/dL   Calcium 8.6 (L) 8.9 - 10.3 mg/dL   Total Protein 5.9 (L) 6.5 - 8.1 g/dL   Albumin 3.6 3.5 - 5.0 g/dL   AST 20 15 - 41 U/L   ALT 16 0 - 44 U/L   Alkaline Phosphatase 49 38 - 126 U/L   Total Bilirubin 1.2 0.3 - 1.2 mg/dL   GFR, Estimated >81 >19 mL/min    Comment: (NOTE) Calculated using the CKD-EPI Creatinine Equation (2021)     Anion gap 7 5 - 15    Comment: Performed at [facility], [address] [place], [place] [address]  CBC with Differential     Status: Abnormal   Collection Time: 02/13/23 12:43 PM  Result Value Ref Range   WBC 3.2 (L) 4.0 - 10.5 K/uL   RBC 3.06 (L) 4.22 - 5.81 MIL/uL   Hemoglobin 10.3 (L) 13.0 - 17.0 g/dL   HCT 95.6 (L) 21.3 - 08.6 %   MCV 99.7 80.0 - 100.0 fL   MCH 33.7 26.0 - 34.0 pg   MCHC 33.8 30.0 - 36.0 g/dL   RDW 57.8 46.9 - 62.9 %   Platelets 115 (L) 150 - 400 K/uL   nRBC 0.0 0.0 - 0.2 %   Neutrophils Relative % 62 %   Neutro Abs 2.0 1.7 - 7.7 K/uL   Lymphocytes Relative 19 %   Lymphs Abs 0.6 (L) 0.7 - 4.0 K/uL   Monocytes Relative 13 %   Monocytes Absolute 0.4 0.1 - 1.0 K/uL   Eosinophils Relative 5 %   Eosinophils Absolute 0.2 0.0 - 0.5 K/uL   Basophils Relative 1 %   Basophils Absolute 0.0 0.0 - 0.1 K/uL   Immature Granulocytes 0 %   Abs Immature Granulocytes 0.00 0.00 - 0.07 K/uL    Comment: Performed at [facility], [address] [place], [place] [address]  POCT INR     Status: Abnormal   Collection Time: 02/23/23  9:22 AM  Result Value Ref Range   INR 1.9 (A) 2.0 - 3.0   POC INR 1.9    PT 23.1   POCT INR     Status: None   Collection Time: 03/11/23  2:25 PM  Result Value Ref Range   INR 2.3 2.0 - 3.0   POC INR 2.3    PT 27.2   Comprehensive metabolic panel     Status: Abnormal   Collection Time: 03/13/23 11:31 AM  Result Value Ref Range   Sodium 139 135 - 145 mmol/L   Potassium 3.9 3.5 - 5.1 mmol/L   Chloride 108 98 - 111 mmol/L   CO2 25 22 - 32 mmol/L   Glucose, Bld 100 (H) 70 - 99 mg/dL    Comment: Glucose reference range applies only to samples taken after fasting for at least 8 hours.   BUN 22 8 - 23 mg/dL   Creatinine, Ser 3.24 0.61 - 1.24 mg/dL   Calcium 8.4 (L) 8.9 - 10.3 mg/dL   Total Protein 6.0 (L) 6.5 - 8.1 g/dL   Albumin 3.7 3.5 - 5.0 g/dL   AST 19 15 - 41 U/L   ALT 17 0 - 44 U/L   Alkaline Phosphatase 50 38 - 126  U/L   Total Bilirubin 1.0 0.3 - 1.2 mg/dL   GFR, Estimated >  60 >60 mL/min    Comment: (NOTE) Calculated using the CKD-EPI Creatinine Equation (2021)    Anion gap 6 5 - 15    Comment: Performed at [facility], [address] [place], [place] [address]  CBC with Differential     Status: Abnormal   Collection Time: 03/13/23 11:31 AM  Result Value Ref Range   WBC 4.3 4.0 - 10.5 K/uL   RBC 3.05 (L) 4.22 - 5.81 MIL/uL   Hemoglobin 10.3 (L) 13.0 - 17.0 g/dL   HCT 60.4 (L) 54.0 - 98.1 %   MCV 99.3 80.0 - 100.0 fL   MCH 33.8 26.0 - 34.0 pg   MCHC 34.0 30.0 - 36.0 g/dL   RDW 19.1 47.8 - 29.5 %   Platelets 110 (L) 150 - 400 K/uL   nRBC 0.0 0.0 - 0.2 %   Neutrophils Relative % 71 %   Neutro Abs 3.0 1.7 - 7.7 K/uL   Lymphocytes Relative 13 %   Lymphs Abs 0.6 (L) 0.7 - 4.0 K/uL   Monocytes Relative 11 %   Monocytes Absolute 0.5 0.1 - 1.0 K/uL   Eosinophils Relative 3 %   Eosinophils Absolute 0.1 0.0 - 0.5 K/uL   Basophils Relative 1 %   Basophils Absolute 0.0 0.0 - 0.1 K/uL   Immature Granulocytes 1 %   Abs Immature Granulocytes 0.02 0.00 - 0.07 K/uL    Comment: Performed at [facility], [address] [place], [place] [address]  TSH     Status: None   Collection Time: 04/10/23  8:43 AM  Result Value Ref Range   TSH 2.092 0.350 - 4.500 uIU/mL    Comment: Performed by a 3rd Generation assay with a functional sensitivity of <=0.01 uIU/mL. Performed at [facility], [address] [place], [place] [address]   T4     Status: None   Collection Time: 04/10/23  8:43 AM  Result Value Ref Range   T4, Total 6.7 4.5 - 12.0 ug/dL    Comment: (NOTE) Performed At: [facility] [address] [place], [place] [id number] Jolene Schimke MD [id number]   Comprehensive metabolic panel     Status: Abnormal   Collection Time: 04/10/23  8:43 AM  Result Value Ref Range   Sodium 139 135 - 145 mmol/L   Potassium 3.9 3.5 - 5.1 mmol/L   Chloride 110 98 - 111  mmol/L   CO2 24 22 - 32 mmol/L   Glucose, Bld 104 (H) 70 - 99 mg/dL    Comment: Glucose reference range applies only to samples taken after fasting for at least 8 hours.   BUN 21 8 - 23 mg/dL   Creatinine, Ser 5.36 0.61 - 1.24 mg/dL   Calcium 8.4 (L) 8.9 - 10.3 mg/dL   Total Protein 5.7 (L) 6.5 - 8.1 g/dL   Albumin 3.5 3.5 - 5.0 g/dL   AST 19 15 - 41 U/L   ALT 15 0 - 44 U/L   Alkaline Phosphatase 53 38 - 126 U/L   Total Bilirubin 1.0 0.3 - 1.2 mg/dL   GFR, Estimated >64 >40 mL/min    Comment: (NOTE) Calculated using the CKD-EPI Creatinine Equation (2021)    Anion gap 5 5 - 15    Comment: Performed at [facility], [address] [place], [place] [address]  CBC with Differential     Status: Abnormal   Collection Time: 04/10/23  8:43 AM  Result Value Ref Range   WBC 3.1 (L)  4.0 - 10.5 K/uL   RBC 3.02 (L) 4.22 - 5.81 MIL/uL   Hemoglobin 10.2 (L) 13.0 - 17.0 g/dL   HCT 78.2 (L) 95.6 - 21.3 %   MCV 103.0 (H) 80.0 - 100.0 fL   MCH 33.8 26.0 - 34.0 pg   MCHC 32.8 30.0 - 36.0 g/dL   RDW 08.6 57.8 - 46.9 %   Platelets 102 (L) 150 - 400 K/uL   nRBC 0.0 0.0 - 0.2 %   Neutrophils Relative % 69 %   Neutro Abs 2.1 1.7 - 7.7 K/uL   Lymphocytes Relative 15 %   Lymphs Abs 0.5 (L) 0.7 - 4.0 K/uL   Monocytes Relative 12 %   Monocytes Absolute 0.4 0.1 - 1.0 K/uL   Eosinophils Relative 3 %   Eosinophils Absolute 0.1 0.0 - 0.5 K/uL   Basophils Relative 1 %   Basophils Absolute 0.0 0.0 - 0.1 K/uL   Immature Granulocytes 0 %   Abs Immature Granulocytes 0.01 0.00 - 0.07 K/uL    Comment: Performed at [facility], [address] [place], [place] [address]    Radiology CT CHEST ABDOMEN PELVIS W CONTRAST  Result Date: 03/31/2023 CLINICAL DATA:  Left upper lobe lung cancer restaging * Tracking Code: BO * EXAM: CT CHEST, ABDOMEN, AND PELVIS WITH CONTRAST TECHNIQUE: Multidetector CT imaging of the chest, abdomen and pelvis was performed following the standard protocol during bolus  administration of intravenous contrast. RADIATION DOSE REDUCTION: This exam was performed according to the departmental dose-optimization program which includes automated exposure control, adjustment of the mA and/or kV according to patient size and/or use of iterative reconstruction technique. CONTRAST:  OMNIPAQUE IOHEXOL 300 MG/ML  SOLN COMPARISON:  11/19/2022 FINDINGS: CT CHEST FINDINGS Cardiovascular: Right chest port catheter. Aortic atherosclerosis. Cardiomegaly. Three-vessel coronary artery calcifications. No pericardial effusion. Mediastinum/Nodes: No persistently enlarged mediastinal, hilar, or axillary lymph nodes. Thyroid gland, trachea, and esophagus demonstrate no significant findings. Lungs/Pleura: Unchanged mass of the medial left upper lobe measuring 5.1 x 1.7 cm, with adjacent bandlike post treatment/post radiation fibrosis and consolidation of the perihilar left lung (series 4, image 64). Mild centrilobular emphysema. Diffuse bilateral bronchial wall thickening. Unchanged small left pleural effusion. Musculoskeletal: No chest wall abnormality. No acute osseous findings. CT ABDOMEN PELVIS FINDINGS Hepatobiliary: No solid liver abnormality is seen. Unchanged small hyperenhancing lesions of the inferior right lobe of the liver, hepatic segment VI, likely small flash filling hemangiomata (series 2, image 80). Contracted gallbladder. No gallstones, gallbladder wall thickening, or biliary dilatation. Pancreas: Unremarkable. No pancreatic ductal dilatation or surrounding inflammatory changes. Spleen: Normal in size without significant abnormality. Adrenals/Urinary Tract: Adrenal glands are unremarkable. Unchanged, thickly septated mixed solid and cystic exophytic lesion of the peripheral superior pole of the right kidney measuring 3.1 x 2.6 cm (series 2, image 67). Multiple additional simple and thinly septated benign bilateral renal cortical cysts. Kidneys are otherwise normal, without renal  calculi, solid lesion, or hydronephrosis. Unchanged tiny calculi within the bladder (series 2, image 108). Stomach/Bowel: Stomach is within normal limits. Appendix appears normal. No evidence of bowel wall thickening, distention, or inflammatory changes. Moderate burden of stool throughout the colon and rectum. Vascular/Lymphatic: Aortic atherosclerosis. No enlarged abdominal or pelvic lymph nodes. Reproductive: Severe prostatomegaly. Other: No abdominal wall hernia or abnormality. No ascites. Musculoskeletal: No acute osseous findings. IMPRESSION: 1. Unchanged mass of the medial left upper lobe, with adjacent bandlike post treatment/post radiation fibrosis and consolidation of the perihilar left lung. Unchanged small left pleural effusion. 2.  No evidence of persistent lymphadenopathy or metastatic disease in the chest, abdomen, or pelvis. 3. 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 4. Unchanged small hyperenhancing lesions of the inferior right lobe of the liver, likely small flash filling hemangiomata. Attention on follow-up. 5. Emphysema and diffuse bilateral bronchial wall thickening. 6. Severe prostatomegaly. 7. Coronary artery disease. Aortic Atherosclerosis (ICD10-I70.0) and Emphysema (ICD10-J43.9). Electronically Signed   By: Jearld Lesch M.D.   On: 03/31/2023 07:16    Assessment/Plan  Lymphedema Leg swelling from a combination of chronic lymphedema, multiple medical issues, and some venous disease.  Patient doing well with conservative therapy.  Will continue compression and elevation.  Follow-up in 1 year.  Cardiomyopathy, dilated, nonischemic (HCC) Can certainly worsen lower extremity swelling.  A-fib On anticoagulation  Chronic venous insufficiency Compression and elevation.  No role for intervention at this  time.    Festus Barren, MD  04/24/2023 12:54 PM    This note was created with Dragon medical transcription system.  Any errors from dictation are purely unintentional

## 2023-04-24 NOTE — Assessment & Plan Note (Signed)
Can certainly worsen lower extremity swelling. 

## 2023-04-24 NOTE — Assessment & Plan Note (Signed)
On anticoagulation 

## 2023-04-29 ENCOUNTER — Telehealth: Payer: Self-pay | Admitting: Oncology

## 2023-04-29 ENCOUNTER — Other Ambulatory Visit: Payer: Self-pay | Admitting: Oncology

## 2023-04-29 DIAGNOSIS — C3412 Malignant neoplasm of upper lobe, left bronchus or lung: Secondary | ICD-10-CM

## 2023-04-29 NOTE — Telephone Encounter (Signed)
Patients wife called to state they need to go out of town on Friday 13. She would like to r/s his appointments to the day before or the following week. Please advise

## 2023-04-29 NOTE — Telephone Encounter (Signed)
Treatment deferred by 1 week

## 2023-05-04 ENCOUNTER — Telehealth: Payer: Self-pay | Admitting: *Deleted

## 2023-05-04 NOTE — Telephone Encounter (Signed)
They are going out of town on 9/13 and wanted to change the appt. Dr Smith Robert reviewed and put the next  tx with him 9/20 at 10. They saw it on my chart and it is good for them.

## 2023-05-08 ENCOUNTER — Ambulatory Visit: Payer: Medicare HMO | Admitting: Oncology

## 2023-05-08 ENCOUNTER — Ambulatory Visit: Payer: Medicare HMO

## 2023-05-08 ENCOUNTER — Other Ambulatory Visit: Payer: Medicare HMO

## 2023-05-14 DIAGNOSIS — I482 Chronic atrial fibrillation, unspecified: Secondary | ICD-10-CM | POA: Diagnosis not present

## 2023-05-14 DIAGNOSIS — E782 Mixed hyperlipidemia: Secondary | ICD-10-CM | POA: Diagnosis not present

## 2023-05-14 DIAGNOSIS — I10 Essential (primary) hypertension: Secondary | ICD-10-CM | POA: Diagnosis not present

## 2023-05-15 ENCOUNTER — Encounter: Payer: Self-pay | Admitting: Oncology

## 2023-05-15 ENCOUNTER — Inpatient Hospital Stay (HOSPITAL_BASED_OUTPATIENT_CLINIC_OR_DEPARTMENT_OTHER): Payer: Medicare HMO | Attending: Oncology | Admitting: Oncology

## 2023-05-15 ENCOUNTER — Inpatient Hospital Stay: Payer: Medicare HMO

## 2023-05-15 ENCOUNTER — Inpatient Hospital Stay: Payer: Medicare HMO | Attending: Oncology

## 2023-05-15 VITALS — BP 143/69 | HR 53 | Temp 97.3°F | Resp 18 | Wt 154.8 lb

## 2023-05-15 DIAGNOSIS — D539 Nutritional anemia, unspecified: Secondary | ICD-10-CM | POA: Insufficient documentation

## 2023-05-15 DIAGNOSIS — Z86718 Personal history of other venous thrombosis and embolism: Secondary | ICD-10-CM | POA: Insufficient documentation

## 2023-05-15 DIAGNOSIS — Z79899 Other long term (current) drug therapy: Secondary | ICD-10-CM | POA: Insufficient documentation

## 2023-05-15 DIAGNOSIS — Z79621 Long term (current) use of calcineurin inhibitor: Secondary | ICD-10-CM | POA: Diagnosis not present

## 2023-05-15 DIAGNOSIS — Z7901 Long term (current) use of anticoagulants: Secondary | ICD-10-CM | POA: Insufficient documentation

## 2023-05-15 DIAGNOSIS — C3412 Malignant neoplasm of upper lobe, left bronchus or lung: Secondary | ICD-10-CM | POA: Diagnosis not present

## 2023-05-15 DIAGNOSIS — Z87891 Personal history of nicotine dependence: Secondary | ICD-10-CM | POA: Diagnosis not present

## 2023-05-15 DIAGNOSIS — Z5112 Encounter for antineoplastic immunotherapy: Secondary | ICD-10-CM | POA: Diagnosis not present

## 2023-05-15 DIAGNOSIS — Z923 Personal history of irradiation: Secondary | ICD-10-CM | POA: Diagnosis not present

## 2023-05-15 DIAGNOSIS — N400 Enlarged prostate without lower urinary tract symptoms: Secondary | ICD-10-CM | POA: Insufficient documentation

## 2023-05-15 DIAGNOSIS — C771 Secondary and unspecified malignant neoplasm of intrathoracic lymph nodes: Secondary | ICD-10-CM | POA: Diagnosis not present

## 2023-05-15 DIAGNOSIS — D649 Anemia, unspecified: Secondary | ICD-10-CM

## 2023-05-15 LAB — COMPREHENSIVE METABOLIC PANEL
ALT: 16 U/L (ref 0–44)
AST: 18 U/L (ref 15–41)
Albumin: 3.3 g/dL — ABNORMAL LOW (ref 3.5–5.0)
Alkaline Phosphatase: 51 U/L (ref 38–126)
Anion gap: 5 (ref 5–15)
BUN: 30 mg/dL — ABNORMAL HIGH (ref 8–23)
CO2: 25 mmol/L (ref 22–32)
Calcium: 8.3 mg/dL — ABNORMAL LOW (ref 8.9–10.3)
Chloride: 107 mmol/L (ref 98–111)
Creatinine, Ser: 1.05 mg/dL (ref 0.61–1.24)
GFR, Estimated: >60 mL/min (ref >60)
Glucose, Bld: 101 mg/dL — ABNORMAL HIGH (ref 70–99)
Potassium: 3.9 mmol/L (ref 3.5–5.1)
Sodium: 137 mmol/L (ref 135–145)
Total Bilirubin: 1.2 mg/dL (ref 0.3–1.2)
Total Protein: 5.7 g/dL — ABNORMAL LOW (ref 6.5–8.1)

## 2023-05-15 LAB — CBC WITH DIFFERENTIAL/PLATELET
Abs Immature Granulocytes: 0.02 10*3/uL (ref 0.00–0.07)
Basophils Absolute: 0 10*3/uL (ref 0.0–0.1)
Basophils Relative: 1 %
Eosinophils Absolute: 0.1 10*3/uL (ref 0.0–0.5)
Eosinophils Relative: 3 %
HCT: 30.3 % — ABNORMAL LOW (ref 39.0–52.0)
Hemoglobin: 10.2 g/dL — ABNORMAL LOW (ref 13.0–17.0)
Immature Granulocytes: 1 %
Lymphocytes Relative: 14 %
Lymphs Abs: 0.5 10*3/uL — ABNORMAL LOW (ref 0.7–4.0)
MCH: 34.1 pg — ABNORMAL HIGH (ref 26.0–34.0)
MCHC: 33.7 g/dL (ref 30.0–36.0)
MCV: 101.3 fL — ABNORMAL HIGH (ref 80.0–100.0)
Monocytes Absolute: 0.5 10*3/uL (ref 0.1–1.0)
Monocytes Relative: 13 %
Neutro Abs: 2.6 10*3/uL (ref 1.7–7.7)
Neutrophils Relative %: 68 %
Platelets: 101 10*3/uL — ABNORMAL LOW (ref 150–400)
RBC: 2.99 MIL/uL — ABNORMAL LOW (ref 4.22–5.81)
RDW: 14.2 % (ref 11.5–15.5)
WBC: 3.8 10*3/uL — ABNORMAL LOW (ref 4.0–10.5)
nRBC: 0 % (ref 0.0–0.2)

## 2023-05-15 MED ORDER — SODIUM CHLORIDE 0.9 % IV SOLN
Freq: Once | INTRAVENOUS | Status: AC
  Filled 2023-05-15: qty 250

## 2023-05-15 MED ORDER — HEPARIN SOD (PORK) LOCK FLUSH 100 UNIT/ML IV SOLN
500.0000 [IU] | Freq: Once | INTRAVENOUS | Status: AC | PRN
  Administered 2023-05-15: 500 [IU]
  Filled 2023-05-15: qty 5

## 2023-05-15 MED ORDER — SODIUM CHLORIDE 0.9 % IV SOLN
1500.0000 mg | Freq: Once | INTRAVENOUS | Status: AC
  Administered 2023-05-15: 1500 mg via INTRAVENOUS
  Filled 2023-05-15: qty 30

## 2023-05-15 NOTE — Progress Notes (Signed)
Hematology/Oncology Consult note [facility]  [phone] [phone]  Patient Care Team: Ronnald Ramp, MD as PCP - General (Family Medicine) Vanna Scotland, MD as Consulting Physician (Urology) Lamar Blinks, MD as Consulting Physician (Cardiology) Lockie Mola, MD as Referring Physician (Ophthalmology) Deirdre Evener, MD (Dermatology) Glory Buff, RN as Oncology Nurse Navigator Creig Hines, MD as Consulting Physician (Oncology) Salena Saner, MD as Consulting Physician (Pulmonary Disease)   Name of the [name]  [id number]  [birth year]   Date of visit: 05/15/23  Diagnosis- 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  He was found to have a left upper lobe lung mass measuring 5.5 x 3.7 x 2.8 cm.  It was confluent with the left hilar lymph node.  Enlarged AP window lymph node measuring 16 mm.  Enlarged left paratracheal lymph node.  Low-attenuation 7 mm lesion in the liver.  7 mm cyst in the uncinate process of the pancreas for which follow-up was not recommended.  Bilateral kidney cysts.  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  Prostatic enlargement.   PET CT is can showed hypermetabolic left upper lobe lung mass measuring 5.4 x 3.9 cm with an SUV of 15.1.  Ipsilateral mediastinal lymph node metastases with index lymph node measuring 1.7 with an SUV of 9.8.  No evidence of distant metastatic disease.  Biopsy of the left upper lobe as well as station 4R lymph node was consistent with squamous cell carcinoma   NGS testing did not show any actionable mutations.  PD-L1 35%.  MSI  stable.  MRI brain was negative for metastatic disease   Patient completed concurrent chemoradiation with weekly CarboTaxol chemotherapy in December 2023.  Scans following that showed partial response to treatment.  Patient started adjuvant durvalumab in January 2024  Interval history-reports occasional cough especially at night.  This is overall self-limited and patient does not wish to start any new medications for cough at this time  ECOG PS- 1 Pain scale- 0   Review of systems- Review of Systems  Respiratory:  Positive for cough.       Allergies  Allergen Reactions   Cefdinir Nausea Only    hypersensitivity to smell   Doxycycline     Sun sensitivity   Prednisone     Hallucinations    Sertraline Other (See Comments)    Hallucinations      Past Medical History:  Diagnosis Date   A-fib (HCC)    a.) CHA2DS2-VASc = 6 (age x2, HTN, DVT x2, vascular disease history). b.) rate/rhythm maintained on oral carvedilol; chronically anticoagulated using daily warfarin.   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   Bladder calculi    BPH (benign prostatic hyperplasia)    DDD (degenerative disc disease), lumbar    a.) s/p LEFT laminectomy L5   Diverticulosis    DOE (dyspnea on exertion)    DVT (deep venous thrombosis) (  HCC)    Elevated PSA    GERD (gastroesophageal reflux disease)    H/O degenerative disc disease    HLD (hyperlipidemia)    Hypertension    Lipoma of arm    Lipomatosis    Long term current use of anticoagulant    a.) warfarin   Lung cancer (HCC)    Mass of upper lobe of left lung 04/24/2022   a.)  CT chest 04/24/2022: Multilobulated perihilar mass measuring 5.3 x 3.7 x 2.8 cm with associated LEFT hilar and mediastinal LAD; b.)  PET CT 05/07/2022:  Hypermetabolic LEFT upper lobe lung mass (max SUV 15.1); ipsilateral nodal metastasis --> presuming a NSC histology, 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   NICM (nonischemic cardiomyopathy) (HCC) 11/22/2015   a.) TTE 11/22/2015: EF 40%. b.) TTE 12/27/2018: EF 45%. c.) TTE 08/30/2020: EF 45-50%.   Night sweats    Obesity    Osteoarthritis    RBBB (right bundle branch block)    Right inguinal hernia    VHD (valvular heart disease) 11/22/2015   a.) TTE 11/22/2015: EF 40%; mod MR, mod-sev TR, triv PR; mod BAE, mild RV enlargement. b.) TTE 12/27/2018: EF 45%; mod MR, mod-sev TR, triv PR; mod BAE, mild RV enlargement. c.) TTE 08/30/2020: EF 45-50%; mild MR/TR.     Past Surgical History:  Procedure Laterality Date   Bilateral Radical Keratotomy Bilateral 1997   COLONOSCOPY WITH PROPOFOL N/A 01/11/2016   Procedure: COLONOSCOPY WITH PROPOFOL;  Surgeon: Scot Jun, MD;  Location: [facility] ENDOSCOPY;  Service: Endoscopy;  Laterality: N/A; repeat 3 years, tubular adenoma   DENTAL SURGERY     Patient had implants   FLEXIBLE BRONCHOSCOPY Left 05/19/2022   Procedure: FLEXIBLE BRONCHOSCOPY;  Surgeon: Salena Saner, MD;  Location: [facility] ORS;  Service: Cardiopulmonary;  Laterality: Left;   GUM SURGERY     IR IMAGING GUIDED PORT INSERTION  06/13/2022   L4 - S1 decompression Left    PROSTATE BIOPSY     TONSILLECTOMY AND ADENOIDECTOMY  age 30   VIDEO BRONCHOSCOPY WITH ENDOBRONCHIAL ULTRASOUND Left 05/19/2022   Procedure: VIDEO BRONCHOSCOPY WITH ENDOBRONCHIAL ULTRASOUND;  Surgeon: Salena Saner, MD;  Location: [facility] ORS;  Service: Cardiopulmonary;  Laterality: Left;   XI ROBOTIC ASSISTED INGUINAL HERNIA REPAIR WITH MESH Right 10/21/2021   Procedure: XI ROBOTIC ASSISTED INGUINAL HERNIA REPAIR WITH MESH;  Surgeon: Campbell Lerner, MD;  Location: [facility] ORS;  Service: General;  Laterality: Right;    Social History   Socioeconomic History   Marital status:  Married    Spouse name: Doris   Number of children: 3   Years of education: Not on file   Highest education level: Associate degree: occupational, Scientist, product/process development, or vocational program  Occupational History   Occupation: retired  Tobacco Use   Smoking status: Former    Current packs/day: 0.00    Types: Cigarettes    Quit date: 08/26/1971    Years since quitting: 51.7    Passive exposure: Never   Smokeless tobacco: Never   Tobacco comments:    Smoked for about 20 years.  Vaping Use   Vaping status: Never Used  Substance and Sexual Activity   Alcohol use: Not Currently   Drug use: No   Sexual activity: Not Currently  Other Topics Concern   Not on file  Social History Narrative   Not on file   Social Determinants of Health   Financial Resource Strain: Low Risk  (03/20/2023)  Received from [facility]   Overall Financial Resource Strain (CARDIA)    Difficulty of Paying Living Expenses: Not very hard  Food Insecurity: No Food Insecurity (03/20/2023)   Received from [facility]   Hunger Vital Sign    Worried About Running Out of Food in the Last Year: Never true    Ran Out of Food in the Last Year: Never true  Transportation Needs: No Transportation Needs (03/20/2023)   Received from [facility] - Transportation    In the past 12 months, has lack of transportation kept you from medical appointments or from getting medications?: No    Lack of Transportation (Non-Medical): No  Physical Activity: Sufficiently Active (12/23/2022)   Exercise Vital Sign    Days of Exercise per Week: 7 days    Minutes of Exercise per Session: 50 min  Stress: No Stress Concern Present (12/18/2021)   [employer] of Occupational Health - Occupational Stress Questionnaire    Feeling of Stress : Only a little  Social Connections: Moderately Integrated (12/23/2022)   Social Connection and Isolation Panel [NHANES]    Frequency of Communication  with Friends and Family: Twice a week    Frequency of Social Gatherings with Friends and Family: Once a week    Attends Religious Services: More than 4 times per year    Active Member of [employer] or Organizations: No    Attends Banker Meetings: Never    Marital Status: Married  Catering manager Violence: Not At Risk (12/23/2022)   Humiliation, Afraid, Rape, and Kick questionnaire    Fear of Current or Ex-Partner: No    Emotionally Abused: No    Physically Abused: No    Sexually Abused: No    Family History  Problem Relation Age of Onset   Cancer Mother        secondary to cancinoma of the jaw from her dipping snuff.   Heart attack Father    CAD Father    Hypertension Sister    Diabetes Son        Type I diabetes   Prostate cancer Neg Hx    Kidney cancer Neg Hx      Current Outpatient Medications:    amLODipine (NORVASC) 5 MG tablet, TAKE 1 TABLET (5 MG TOTAL) BY MOUTH DAILY., Disp: 90 tablet, Rfl: 3   benazepril (LOTENSIN) 40 MG tablet, Take 1 tablet (40 mg total) by mouth daily., Disp: 90 tablet, Rfl: 1   betamethasone dipropionate 0.05 % cream, Apply to aa BID PRN. Use no more than 4 weeks., Disp: 45 g, Rfl: 0   carvedilol (COREG) 6.25 MG tablet, TAKE 1 TABLET (6.25 MG TOTAL) BY MOUTH 2 (TWO) TIMES DAILY., Disp: 180 tablet, Rfl: 3   Cyanocobalamin (VITAMIN B 12 PO), Take 1 capsule by mouth 2 (two) times daily. 1000 mcg each capsule, Disp: , Rfl:    doxazosin (CARDURA) 8 MG tablet, TAKE 1 TABLET (8 MG TOTAL) BY MOUTH DAILY., Disp: 90 tablet, Rfl: 3   finasteride (PROSCAR) 5 MG tablet, TAKE 1 TABLET EVERY DAY, Disp: 90 tablet, Rfl: 3   gabapentin (NEURONTIN) 300 MG capsule, TAKE 1 CAPSULE EVERY MORNING, 1 CAPSULE IN THE AFTERNOON AND 2 CAPSULES AT BEDTIME, Disp: 360 capsule, Rfl: 3   Magnesium 500 MG TABS, Take 500 mg by mouth daily., Disp: , Rfl:    Multiple Vitamin (MULTI-VITAMIN) tablet, Take 1 tablet by mouth daily. , Disp: , Rfl:  omeprazole (PRILOSEC) 20 MG  capsule, Take 20 mg by mouth daily., Disp: , Rfl:    oxyCODONE (OXY IR/ROXICODONE) 5 MG immediate release tablet, Take 1 tablet (5 mg total) by mouth every 6 (six) hours as needed for severe pain., Disp: 90 tablet, Rfl: 0   tacrolimus (PROTOPIC) 0.1 % ointment, Apply topically 2 (two) times daily. Apply as directed. Refer to patient print out, Disp: 60 g, Rfl: 2   triamcinolone cream (KENALOG) 0.1 %, Apply topically to affected area of right lower leg twice daily for 4 weeks. Avoid applying to face, groin, and axilla. Use as directed., Disp: 45 g, Rfl: 0   triamterene-hydrochlorothiazide (MAXZIDE-25) 37.5-25 MG tablet, TAKE 1/2 TABLET EVERY DAY, Disp: 45 tablet, Rfl: 1   warfarin (COUMADIN) 5 MG tablet, TAKE 1 TABLET EVERY DAY, Disp: 90 tablet, Rfl: 3 No current facility-administered medications for this visit.  Facility-Administered Medications Ordered in Other Visits:    heparin lock flush 100 UNIT/ML injection, , , ,   Physical exam: There were no vitals filed for this visit. Physical Exam Cardiovascular:     Rate and Rhythm: Normal rate and regular rhythm.     Heart sounds: Normal heart sounds.  Pulmonary:     Effort: Pulmonary effort is normal.     Breath sounds: Normal breath sounds.  Abdominal:     General: Bowel sounds are normal.     Palpations: Abdomen is soft.  Skin:    General: Skin is warm and dry.  Neurological:     Mental Status: He is alert and oriented to person, place, and time.         Latest Ref Rng & Units 04/10/2023    8:43 AM  CMP  Glucose 70 - 99 mg/dL 664   BUN 8 - 23 mg/dL 21   Creatinine 4.03 - 1.24 mg/dL 4.74   Sodium 259 - 563 mmol/L 139   Potassium 3.5 - 5.1 mmol/L 3.9   Chloride 98 - 111 mmol/L 110   CO2 22 - 32 mmol/L 24   Calcium 8.9 - 10.3 mg/dL 8.4   Total Protein 6.5 - 8.1 g/dL 5.7   Total Bilirubin 0.3 - 1.2 mg/dL 1.0   Alkaline Phos 38 - 126 U/L 53   AST 15 - 41 U/L 19   ALT 0 - 44 U/L 15       Latest Ref Rng & Units 04/10/2023     8:43 AM  CBC  WBC 4.0 - 10.5 K/uL 3.1   Hemoglobin 13.0 - 17.0 g/dL 87.5   Hematocrit 64.3 - 52.0 % 31.1   Platelets 150 - 400 K/uL 102      Assessment and plan- Patient is a 88 y.o. male with history of stage III squamous cell carcinoma of the left upper lobe T3 N2 M0 s/p concurrent weekly CarboTaxol with radiation.  He had partial response to treatment.  He is here for on treatment assessment prior to cycle 8 of adjuvant durvalumab  Counts okay to proceed with cycle 8 of adjuvant durvalumab today and I will see him back in 4 weeks for cycle 9.  Plan is to complete 14 cycles.  Pancytopenia: Patient has had macrocytic anemia dating back to 06/20/2022 when he first received chemotherapy.  At that time his white count was normal at 5.1 with a platelet count of 147.  Over the last 1 year his hemoglobin has remained around 10 and platelet counts have been fluctuating between 70s to 110s.  White count today  is 3.8 mainly with lymphopenia.  Neutrophil counts remain adequate.  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  Cytopenias are nonsevere and patient does not require a bone marrow biopsy at this time.  This will be continued to be monitored   Visit Diagnosis 1. Encounter for antineoplastic immunotherapy   2. Primary malignant neoplasm of left upper lobe of lung (HCC)   3. Normocytic anemia      Dr. Owens Shark, MD, MPH [facility] at [facility] [phone] 05/15/2023 10:20 AM

## 2023-05-15 NOTE — Patient Instructions (Signed)
[place] [facility]  Discharge Instructions: Thank you for choosing [facility] to provide your oncology and hematology care.  If you have a lab appointment with the Cancer Center, please go directly to the Cancer Center and check in at the registration area.  Wear comfortable clothing and clothing appropriate for easy access to any Portacath or PICC line.   We strive to give you quality time with your provider. You may need to reschedule your appointment if you arrive late (15 or more minutes).  Arriving late affects you and other patients whose appointments are after yours.  Also, if you miss three or more appointments without notifying the office, you may be dismissed from the clinic at the provider's discretion.      For prescription refill requests, have your pharmacy contact our office and allow 72 hours for refills to be completed.    Today you received the following chemotherapy and/or immunotherapy agents- Durvalumab      To help prevent nausea and vomiting after your treatment, we encourage you to take your nausea medication as directed.  BELOW ARE SYMPTOMS THAT SHOULD BE REPORTED IMMEDIATELY: *FEVER GREATER THAN 100.4 F (38 C) OR HIGHER *CHILLS OR SWEATING *NAUSEA AND VOMITING THAT IS NOT CONTROLLED WITH YOUR NAUSEA MEDICATION *UNUSUAL SHORTNESS OF BREATH *UNUSUAL BRUISING OR BLEEDING *URINARY PROBLEMS (pain or burning when urinating, or frequent urination) *BOWEL PROBLEMS (unusual diarrhea, constipation, pain near the anus) TENDERNESS IN MOUTH AND THROAT WITH OR WITHOUT PRESENCE OF ULCERS (sore throat, sores in mouth, or a toothache) UNUSUAL RASH, SWELLING OR PAIN  UNUSUAL VAGINAL DISCHARGE OR ITCHING   Items with * indicate a potential emergency and should be followed up as soon as possible or go to the Emergency Department if any problems should occur.  Please show the CHEMOTHERAPY ALERT CARD or IMMUNOTHERAPY ALERT CARD at check-in  to the Emergency Department and triage nurse.  Should you have questions after your visit or need to cancel or reschedule your appointment, please contact [place] [facility] AT [facility]  [phone] and follow the prompts.  Office hours are 8:00 a.m. to 4:30 p.m. Monday - Friday. Please note that voicemails left after 4:00 p.m. may not be returned until the following business day.  We are closed weekends and major holidays. You have access to a nurse at all times for urgent questions. Please call the main number to the clinic [phone] and follow the prompts.  For any non-urgent questions, you may also contact your provider using MyChart. We now offer e-Visits for anyone 76 and older to request care online for non-urgent symptoms. For details visit [email or web address].   Also download the MyChart app! Go to the app store, search "MyChart", open the app, select , and log in with your MyChart username and password.

## 2023-05-19 DIAGNOSIS — Z7901 Long term (current) use of anticoagulants: Secondary | ICD-10-CM | POA: Diagnosis not present

## 2023-05-19 DIAGNOSIS — I482 Chronic atrial fibrillation, unspecified: Secondary | ICD-10-CM | POA: Diagnosis not present

## 2023-05-25 IMAGING — US US EXTREM LOW*R* LIMITED
1 series · 14 of 25 positions shown · non-contrast
Comparison: None.

CLINICAL DATA: Right inguinal bulge for 1 month

EXAM:
ULTRASOUND right LOWER EXTREMITY LIMITED
TECHNIQUE: Ultrasound examination of the lower extremity soft tissues was
performed in the area of clinical concern.

[Series 1: us extrem low*right* limited · 0.07mm/px · 26 acquisitions, 14 frames shown]
[im 1/26]
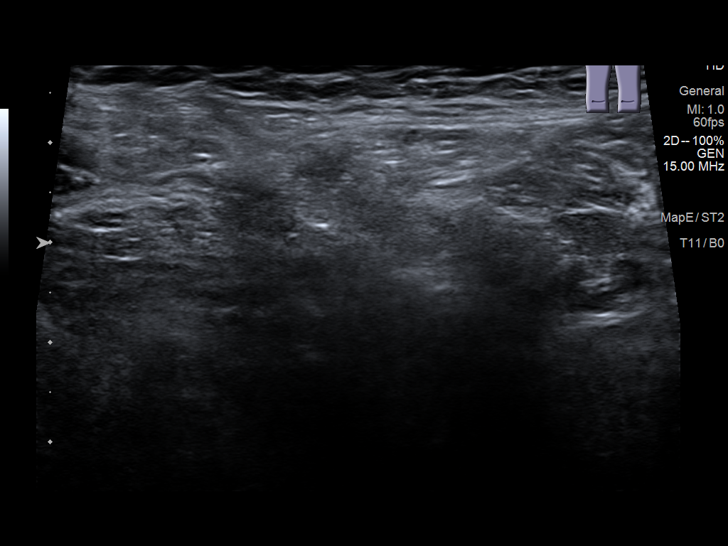
[im 3/26]
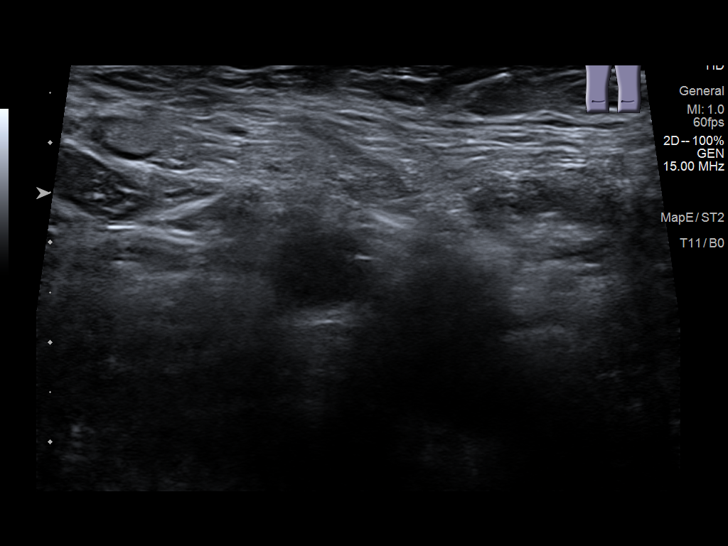
[im 5/26]
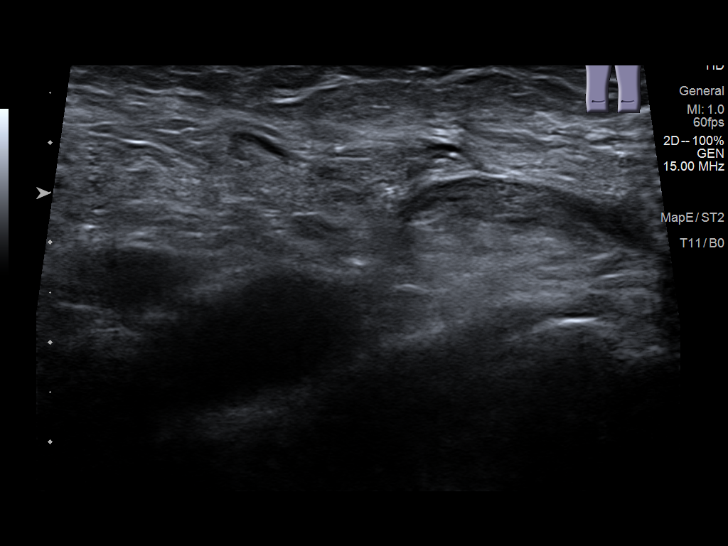
[im 7/26]
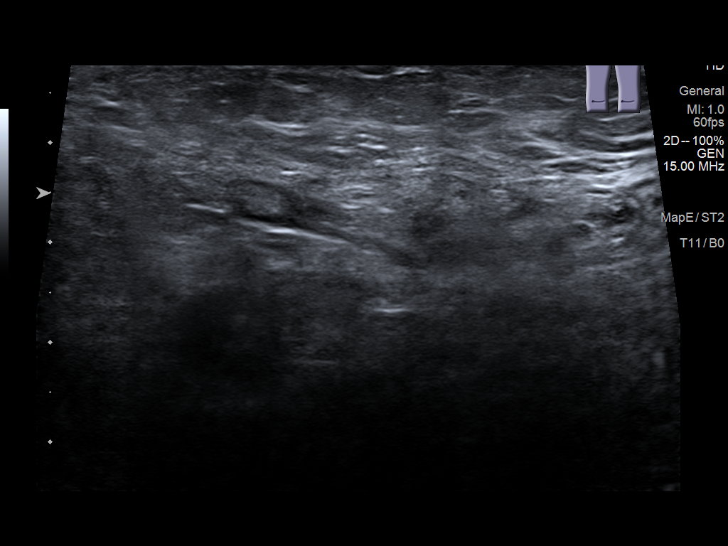
[im 9/26]
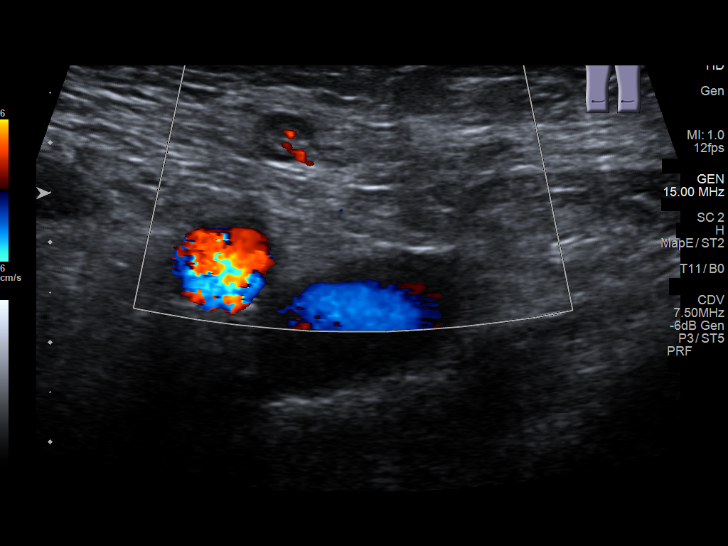
[im 10/26]
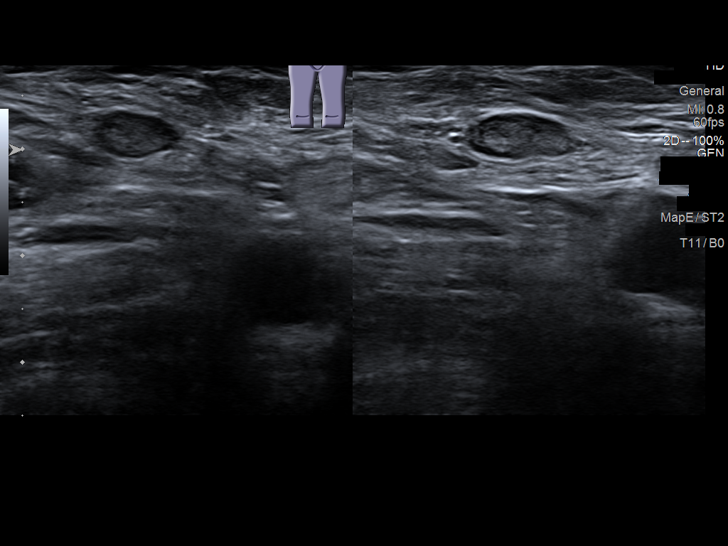
[im 12/26]
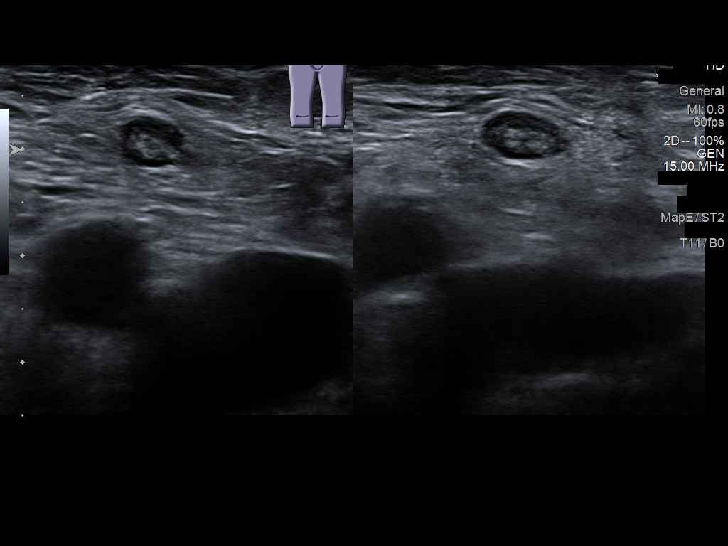
[im 14/26]
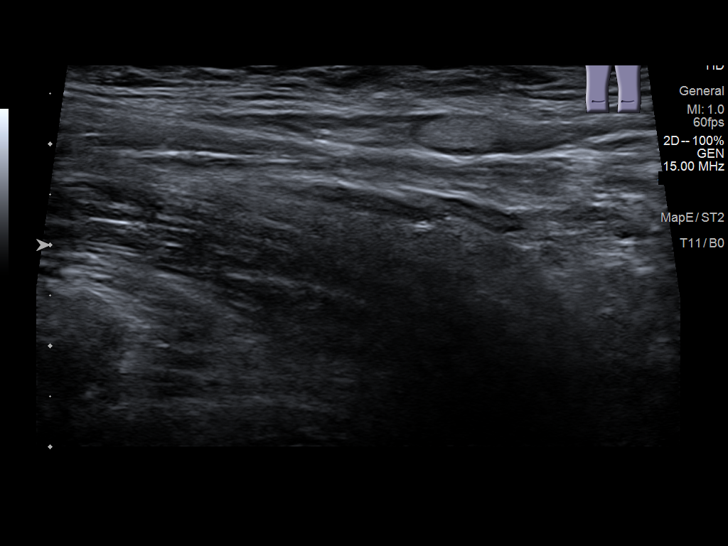
[im 16/26]
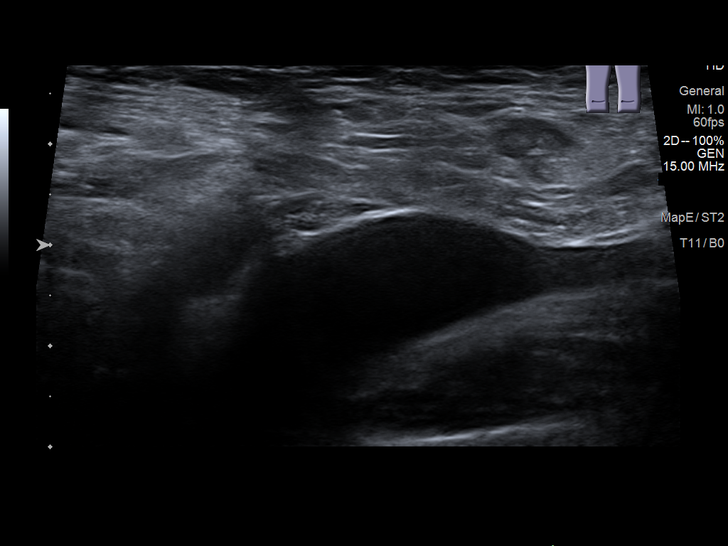
[im 17/26]
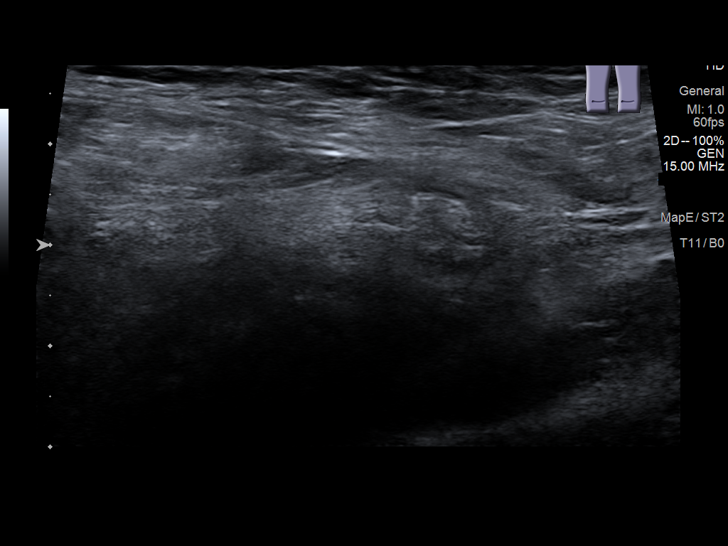
[im 19/26]
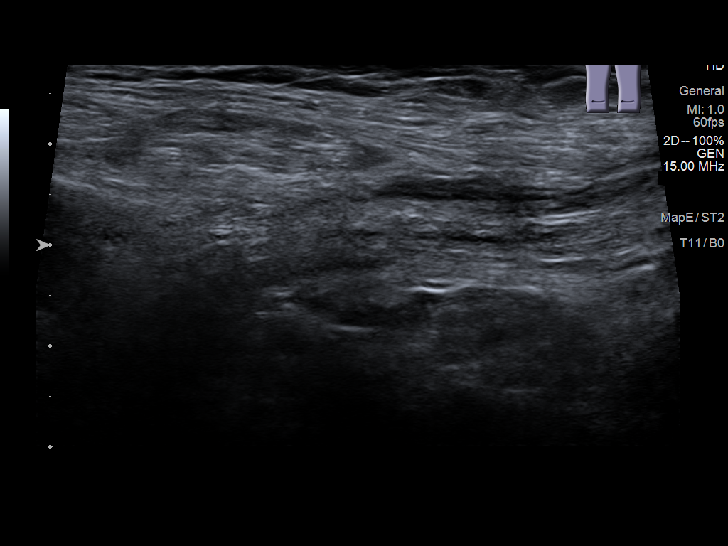
[im 21/26]
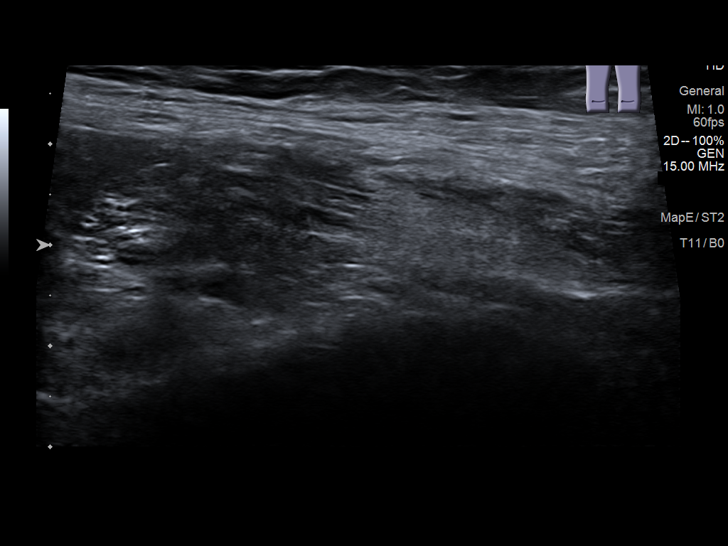
[im 23/26]
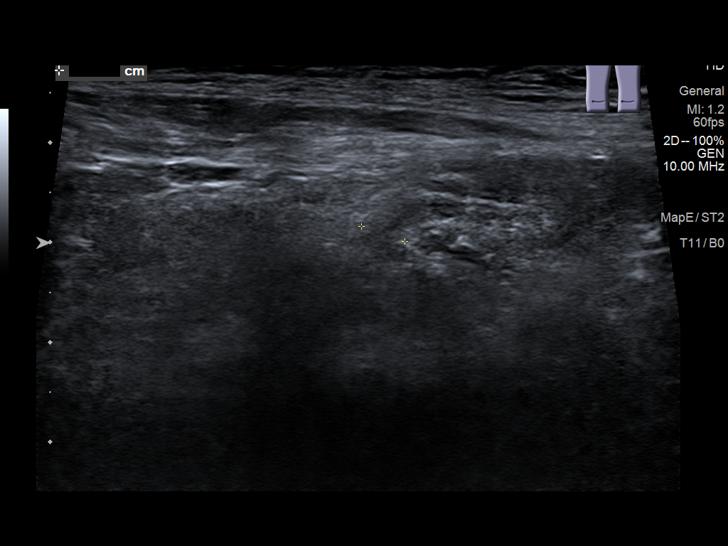
[im 26/26]
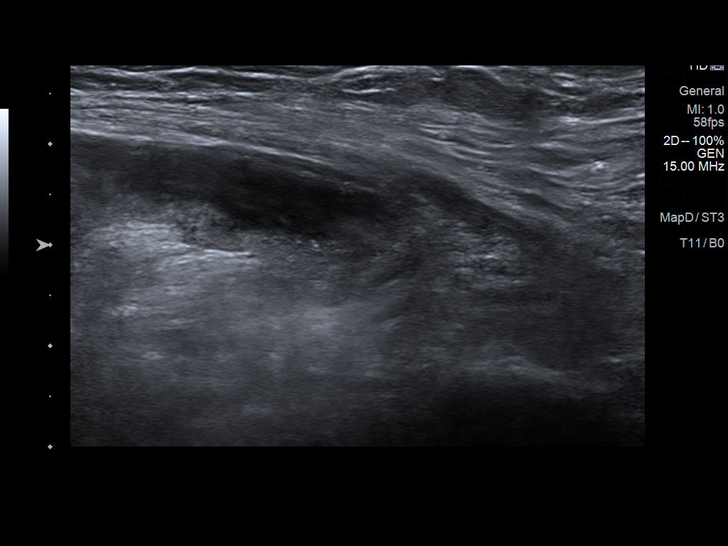

[14 of 25 positions shown; findings below may reference images not displayed]

FINDINGS: Targeted ultrasound of the right groin is performed at rest and with
Valsalva. There are several benign appearing lymph nodes. Positive
for bowel containing right groin hernia, best seen with Valsalva.
IMPRESSION: Positive for bowel containing right groin hernia

## 2023-06-12 ENCOUNTER — Inpatient Hospital Stay (HOSPITAL_BASED_OUTPATIENT_CLINIC_OR_DEPARTMENT_OTHER): Payer: Medicare HMO | Attending: Oncology | Admitting: Oncology

## 2023-06-12 ENCOUNTER — Inpatient Hospital Stay: Payer: Medicare HMO | Attending: Oncology

## 2023-06-12 ENCOUNTER — Inpatient Hospital Stay: Payer: Medicare HMO

## 2023-06-12 ENCOUNTER — Encounter: Payer: Self-pay | Admitting: Oncology

## 2023-06-12 VITALS — BP 153/77 | HR 45 | Temp 97.0°F | Ht 68.0 in | Wt 159.2 lb

## 2023-06-12 DIAGNOSIS — Z5112 Encounter for antineoplastic immunotherapy: Secondary | ICD-10-CM | POA: Diagnosis not present

## 2023-06-12 DIAGNOSIS — C3412 Malignant neoplasm of upper lobe, left bronchus or lung: Secondary | ICD-10-CM | POA: Insufficient documentation

## 2023-06-12 DIAGNOSIS — D61818 Other pancytopenia: Secondary | ICD-10-CM | POA: Diagnosis not present

## 2023-06-12 LAB — CBC WITH DIFFERENTIAL/PLATELET
Abs Immature Granulocytes: 0.01 10*3/uL (ref 0.00–0.07)
Basophils Absolute: 0 10*3/uL (ref 0.0–0.1)
Basophils Relative: 1 %
Eosinophils Absolute: 0.2 10*3/uL (ref 0.0–0.5)
Eosinophils Relative: 4 %
HCT: 32 % — ABNORMAL LOW (ref 39.0–52.0)
Hemoglobin: 10.8 g/dL — ABNORMAL LOW (ref 13.0–17.0)
Immature Granulocytes: 0 %
Lymphocytes Relative: 15 %
Lymphs Abs: 0.6 10*3/uL — ABNORMAL LOW (ref 0.7–4.0)
MCH: 34.1 pg — ABNORMAL HIGH (ref 26.0–34.0)
MCHC: 33.8 g/dL (ref 30.0–36.0)
MCV: 100.9 fL — ABNORMAL HIGH (ref 80.0–100.0)
Monocytes Absolute: 0.5 10*3/uL (ref 0.1–1.0)
Monocytes Relative: 12 %
Neutro Abs: 2.7 10*3/uL (ref 1.7–7.7)
Neutrophils Relative %: 68 %
Platelets: 121 10*3/uL — ABNORMAL LOW (ref 150–400)
RBC: 3.17 MIL/uL — ABNORMAL LOW (ref 4.22–5.81)
RDW: 14.4 % (ref 11.5–15.5)
WBC: 4.1 10*3/uL (ref 4.0–10.5)
nRBC: 0 % (ref 0.0–0.2)

## 2023-06-12 LAB — COMPREHENSIVE METABOLIC PANEL
ALT: 17 U/L (ref 0–44)
AST: 22 U/L (ref 15–41)
Albumin: 3.6 g/dL (ref 3.5–5.0)
Alkaline Phosphatase: 58 U/L (ref 38–126)
Anion gap: 5 (ref 5–15)
BUN: 19 mg/dL (ref 8–23)
CO2: 24 mmol/L (ref 22–32)
Calcium: 8.5 mg/dL — ABNORMAL LOW (ref 8.9–10.3)
Chloride: 109 mmol/L (ref 98–111)
Creatinine, Ser: 0.95 mg/dL (ref 0.61–1.24)
GFR, Estimated: >60 mL/min (ref >60)
Glucose, Bld: 115 mg/dL — ABNORMAL HIGH (ref 70–99)
Potassium: 3.9 mmol/L (ref 3.5–5.1)
Sodium: 138 mmol/L (ref 135–145)
Total Bilirubin: 1 mg/dL (ref 0.3–1.2)
Total Protein: 6 g/dL — ABNORMAL LOW (ref 6.5–8.1)

## 2023-06-12 MED ORDER — SODIUM CHLORIDE 0.9 % IV SOLN
Freq: Once | INTRAVENOUS | Status: AC
  Filled 2023-06-12: qty 250

## 2023-06-12 MED ORDER — HEPARIN SOD (PORK) LOCK FLUSH 100 UNIT/ML IV SOLN
500.0000 [IU] | Freq: Once | INTRAVENOUS | Status: DC
  Filled 2023-06-12: qty 5

## 2023-06-12 MED ORDER — SODIUM CHLORIDE 0.9 % IV SOLN
1500.0000 mg | Freq: Once | INTRAVENOUS | Status: AC
  Administered 2023-06-12: 1500 mg via INTRAVENOUS
  Filled 2023-06-12: qty 30

## 2023-06-12 MED ORDER — HEPARIN SOD (PORK) LOCK FLUSH 100 UNIT/ML IV SOLN
500.0000 [IU] | Freq: Once | INTRAVENOUS | Status: DC | PRN
  Filled 2023-06-12: qty 5

## 2023-06-12 MED ORDER — SODIUM CHLORIDE 0.9% FLUSH
10.0000 mL | Freq: Once | INTRAVENOUS | Status: AC
  Administered 2023-06-12: 10 mL via INTRAVENOUS
  Filled 2023-06-12: qty 10

## 2023-06-12 NOTE — Patient Instructions (Signed)
[facility] [place] [facility]  Discharge Instructions: Thank you for choosing [facility] to provide your oncology and hematology care.  If you have a lab appointment with the [facility], please go directly to the [facility] and check in at the registration area.  Wear comfortable clothing and clothing appropriate for easy access to any Portacath or PICC line.   We strive to give you quality time with your provider. You may need to reschedule your appointment if you arrive late (15 or more minutes).  Arriving late affects you and other patients whose appointments are after yours.  Also, if you miss three or more appointments without notifying the office, you may be dismissed from the clinic at the provider's discretion.      For prescription refill requests, have your pharmacy contact our office and allow 72 hours for refills to be completed.    Today you received the following chemotherapy and/or immunotherapy agents Imfinzi        To help prevent nausea and vomiting after your treatment, we encourage you to take your nausea medication as directed.  BELOW ARE SYMPTOMS THAT SHOULD BE REPORTED IMMEDIATELY: *FEVER GREATER THAN 100.4 F (38 C) OR HIGHER *CHILLS OR SWEATING *NAUSEA AND VOMITING THAT IS NOT CONTROLLED WITH YOUR NAUSEA MEDICATION *UNUSUAL SHORTNESS OF BREATH *UNUSUAL BRUISING OR BLEEDING *URINARY PROBLEMS (pain or burning when urinating, or frequent urination) *BOWEL PROBLEMS (unusual diarrhea, constipation, pain near the anus) TENDERNESS IN MOUTH AND THROAT WITH OR WITHOUT PRESENCE OF ULCERS (sore throat, sores in mouth, or a toothache) UNUSUAL RASH, SWELLING OR PAIN  UNUSUAL VAGINAL DISCHARGE OR ITCHING   Items with * indicate a potential emergency and should be followed up as soon as possible or go to the Emergency Department if any problems should occur.  Please show the CHEMOTHERAPY ALERT CARD or IMMUNOTHERAPY ALERT CARD at check-in to  the Emergency Department and triage nurse.  Should you have questions after your visit or need to cancel or reschedule your appointment, please contact [place] [facility] [place] [facility]  [phone] and follow the prompts.  Office hours are 8:00 a.m. to 4:30 p.m. Monday - Friday. Please note that voicemails left after 4:00 p.m. may not be returned until the following business day.  We are closed weekends and major holidays. You have access to a nurse at all times for urgent questions. Please call the main number to the clinic [phone] and follow the prompts.  For any non-urgent questions, you may also contact your provider using MyChart. We now offer e-Visits for anyone 18 and older to request care online for non-urgent symptoms. For details visit [email or web address].   Also download the MyChart app! Go to the app store, search "MyChart", open the app, select [place], and log in with your MyChart username and password.    

## 2023-06-12 NOTE — Progress Notes (Signed)
Hematology/Oncology Consult note [facility]  [phone] [phone]  Patient Care Team: Ronnald Ramp, MD as PCP - General (Family Medicine) Vanna Scotland, MD as Consulting Physician (Urology) Lamar Blinks, MD as Consulting Physician (Cardiology) Lockie Mola, MD as Referring Physician (Ophthalmology) Deirdre Evener, MD (Dermatology) Glory Buff, RN as Oncology Nurse Navigator Creig Hines, MD as Consulting Physician (Oncology) Salena Saner, MD as Consulting Physician (Pulmonary Disease)   Name of the [name]  [id number]  08/07/1935   Date of visit: 06/12/23  Diagnosis-  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  He was found to have a left upper lobe lung mass measuring 5.5 x 3.7 x 2.8 cm.  It was confluent with the left hilar lymph node.  Enlarged AP window lymph node measuring 16 mm.  Enlarged left paratracheal lymph node.  Low-attenuation 7 mm lesion in the liver.  7 mm cyst in the uncinate process of the pancreas for which follow-up was not recommended.  Bilateral kidney cysts.  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  Prostatic enlargement.   PET CT is can showed hypermetabolic left upper lobe lung mass measuring 5.4 x 3.9 cm with an SUV of 15.1.  Ipsilateral mediastinal lymph node metastases with index lymph node measuring 1.7 with an SUV of 9.8.  No evidence of distant metastatic disease.  Biopsy of the left upper lobe as well as station 4R lymph node was consistent with squamous cell carcinoma   NGS testing did not show any actionable mutations.  PD-L1 35%.   MSI stable.  MRI brain was negative for metastatic disease   Patient completed concurrent chemoradiation with weekly CarboTaxol chemotherapy in December 2023.  Scans following that showed partial response to treatment.  Patient started adjuvant durvalumab in January 2024  Interval history-tolerating durvalumab well so far.  Appetite and weight have remained stable.  Cough is occasional but self-limited.  Denies any worsening shortness of breath nausea vomiting or diarrhea  ECOG PS- 1 Pain scale- 0   Review of systems- Review of Systems  Constitutional:  Negative for chills, fever, malaise/fatigue and weight loss.  HENT:  Negative for congestion, ear discharge and nosebleeds.   Eyes:  Negative for blurred vision.  Respiratory:  Negative for cough, hemoptysis, sputum production, shortness of breath and wheezing.   Cardiovascular:  Negative for chest pain, palpitations, orthopnea and claudication.  Gastrointestinal:  Negative for abdominal pain, blood in stool, constipation, diarrhea, heartburn, melena, nausea and vomiting.  Genitourinary:  Negative for dysuria, flank pain, frequency, hematuria and urgency.  Musculoskeletal:  Negative for back pain, joint pain and myalgias.  Skin:  Negative for rash.  Neurological:  Negative for dizziness, tingling, focal weakness, seizures, weakness and headaches.  Endo/Heme/Allergies:  Does not bruise/bleed easily.  Psychiatric/Behavioral:  Negative for depression and suicidal ideas. The patient does not have insomnia.       Allergies  Allergen Reactions   Cefdinir Nausea Only    hypersensitivity to smell   Doxycycline     Sun sensitivity   Prednisone     Hallucinations    Sertraline Other (See Comments)  Hallucinations      Past Medical History:  Diagnosis Date   A-fib [facility])    a.) CHA2DS2-VASc = 6 (age x2, HTN, DVT x2, vascular disease history). b.) rate/rhythm maintained on oral carvedilol; chronically anticoagulated using daily  warfarin.   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   Bladder calculi    BPH (benign prostatic hyperplasia)    DDD (degenerative disc disease), lumbar    a.) s/p LEFT laminectomy L5   Diverticulosis    DOE (dyspnea on exertion)    DVT (deep venous thrombosis) (HCC)    Elevated PSA    GERD (gastroesophageal reflux disease)    H/O degenerative disc disease    HLD (hyperlipidemia)    Hypertension    Lipoma of arm    Lipomatosis    Long term current use of anticoagulant    a.) warfarin   Lung cancer (HCC)    Mass of upper lobe of left lung 04/24/2022   a.)  CT chest 04/24/2022: Multilobulated perihilar mass measuring 5.3 x 3.7 x 2.8 cm with associated LEFT hilar and mediastinal LAD; b.)  PET CT 05/07/2022: Hypermetabolic LEFT upper lobe lung mass (max SUV 15.1); ipsilateral nodal metastasis --> presuming a NSC histology, 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   NICM (nonischemic cardiomyopathy) (HCC) 11/22/2015   a.) TTE 11/22/2015: EF 40%. b.) TTE 12/27/2018: EF 45%. c.) TTE 08/30/2020: EF 45-50%.   Night sweats    Obesity    Osteoarthritis    RBBB (right bundle branch block)    Right inguinal hernia    VHD (valvular heart disease) 11/22/2015   a.) TTE 11/22/2015: EF 40%; mod MR, mod-sev TR, triv PR; mod BAE, mild RV enlargement. b.) TTE 12/27/2018: EF 45%; mod MR, mod-sev TR, triv PR; mod BAE, mild RV enlargement. c.) TTE 08/30/2020: EF 45-50%; mild MR/TR.     Past Surgical History:  Procedure Laterality Date   Bilateral Radical Keratotomy Bilateral 1997   COLONOSCOPY WITH PROPOFOL N/A 01/11/2016   Procedure: COLONOSCOPY WITH PROPOFOL;  Surgeon: Scot Jun, MD;  Location: [facility] ENDOSCOPY;  Service: Endoscopy;  Laterality: N/A;  repeat 3 years, tubular adenoma   DENTAL SURGERY     Patient had implants   FLEXIBLE BRONCHOSCOPY Left 05/19/2022   Procedure: FLEXIBLE BRONCHOSCOPY;  Surgeon: Salena Saner, MD;  Location: [facility] ORS;  Service: Cardiopulmonary;  Laterality: Left;   GUM SURGERY     IR IMAGING GUIDED PORT INSERTION  06/13/2022   L4 - S1 decompression Left    PROSTATE BIOPSY     TONSILLECTOMY AND ADENOIDECTOMY  age 89   VIDEO BRONCHOSCOPY WITH ENDOBRONCHIAL ULTRASOUND Left 05/19/2022   Procedure: VIDEO BRONCHOSCOPY WITH ENDOBRONCHIAL ULTRASOUND;  Surgeon: Salena Saner, MD;  Location: [facility] ORS;  Service: Cardiopulmonary;  Laterality: Left;   XI ROBOTIC ASSISTED INGUINAL HERNIA REPAIR WITH MESH Right 10/21/2021   Procedure: XI ROBOTIC ASSISTED INGUINAL HERNIA REPAIR WITH MESH;  Surgeon: Campbell Lerner, MD;  Location: [facility] ORS;  Service: General;  Laterality: Right;    Social History   Socioeconomic History   Marital status: Married    Spouse name: Doris   Number of children: 3   Years of education: Not on file   Highest  education level: Associate degree: occupational, Scientist, product/process development, or vocational program  Occupational History   Occupation: retired  Tobacco Use   Smoking status: Former    Current packs/day: 0.00    Types: Cigarettes    Quit date: 08/26/1971    Years since quitting: 51.8    Passive exposure: Never   Smokeless tobacco: Never   Tobacco comments:    Smoked for about 20 years.  Vaping Use   Vaping status: Never Used  Substance and Sexual Activity   Alcohol use: Not Currently   Drug use: No   Sexual activity: Not Currently  Other Topics Concern   Not on file  Social History Narrative   Not on file   Social Determinants of Health   Financial Resource Strain: Low Risk  (03/20/2023)   Received from [facility]   Overall Financial Resource Strain (CARDIA)    Difficulty of Paying Living Expenses: Not very hard  Food Insecurity: No Food Insecurity (03/20/2023)    Received from [facility]   Hunger Vital Sign    Worried About Running Out of Food in the Last Year: Never true    Ran Out of Food in the Last Year: Never true  Transportation Needs: No Transportation Needs (03/20/2023)   Received from [facility] - Transportation    In the past 12 months, has lack of transportation kept you from medical appointments or from getting medications?: No    Lack of Transportation (Non-Medical): No  Physical Activity: Sufficiently Active (12/23/2022)   Exercise Vital Sign    Days of Exercise per Week: 7 days    Minutes of Exercise per Session: 50 min  Stress: No Stress Concern Present (12/18/2021)   [employer] of Occupational Health - Occupational Stress Questionnaire    Feeling of Stress : Only a little  Social Connections: Moderately Integrated (12/23/2022)   Social Connection and Isolation Panel [NHANES]    Frequency of Communication with Friends and Family: Twice a week    Frequency of Social Gatherings with Friends and Family: Once a week    Attends Religious Services: More than 4 times per year    Active Member of [employer] or Organizations: No    Attends Banker Meetings: Never    Marital Status: Married  Catering manager Violence: Not At Risk (12/23/2022)   Humiliation, Afraid, Rape, and Kick questionnaire    Fear of Current or Ex-Partner: No    Emotionally Abused: No    Physically Abused: No    Sexually Abused: No    Family History  Problem Relation Age of Onset   Cancer Mother        secondary to cancinoma of the jaw from her dipping snuff.   Heart attack Father    CAD Father    Hypertension Sister    Diabetes Son        Type I diabetes   Prostate cancer Neg Hx    Kidney cancer Neg Hx      Current Outpatient Medications:    amLODipine (NORVASC) 5 MG tablet, TAKE 1 TABLET (5 MG TOTAL) BY MOUTH DAILY., Disp: 90 tablet, Rfl: 3   benazepril (LOTENSIN) 40 MG tablet, Take 1 tablet  (40 mg total) by mouth daily., Disp: 90 tablet, Rfl: 1   betamethasone dipropionate 0.05 % cream, Apply to aa BID PRN. Use no more than 4 weeks., Disp: 45 g, Rfl: 0   carvedilol (COREG) 6.25 MG tablet, TAKE 1 TABLET (  6.25 MG TOTAL) BY MOUTH 2 (TWO) TIMES DAILY., Disp: 180 tablet, Rfl: 3   Cyanocobalamin (VITAMIN B 12 PO), Take 1 capsule by mouth 2 (two) times daily. 1000 mcg each capsule, Disp: , Rfl:    doxazosin (CARDURA) 8 MG tablet, TAKE 1 TABLET (8 MG TOTAL) BY MOUTH DAILY., Disp: 90 tablet, Rfl: 3   finasteride (PROSCAR) 5 MG tablet, TAKE 1 TABLET EVERY DAY, Disp: 90 tablet, Rfl: 3   gabapentin (NEURONTIN) 300 MG capsule, TAKE 1 CAPSULE EVERY MORNING, 1 CAPSULE IN THE AFTERNOON AND 2 CAPSULES AT BEDTIME, Disp: 360 capsule, Rfl: 3   Magnesium 500 MG TABS, Take 500 mg by mouth daily., Disp: , Rfl:    Multiple Vitamin (MULTI-VITAMIN) tablet, Take 1 tablet by mouth daily. , Disp: , Rfl:    omeprazole (PRILOSEC) 20 MG capsule, Take 20 mg by mouth daily., Disp: , Rfl:    oxyCODONE (OXY IR/ROXICODONE) 5 MG immediate release tablet, Take 1 tablet (5 mg total) by mouth every 6 (six) hours as needed for severe pain., Disp: 90 tablet, Rfl: 0   tacrolimus (PROTOPIC) 0.1 % ointment, Apply topically 2 (two) times daily. Apply as directed. Refer to patient print out, Disp: 60 g, Rfl: 2   triamcinolone cream (KENALOG) 0.1 %, Apply topically to affected area of right lower leg twice daily for 4 weeks. Avoid applying to face, groin, and axilla. Use as directed., Disp: 45 g, Rfl: 0   triamterene-hydrochlorothiazide (MAXZIDE-25) 37.5-25 MG tablet, TAKE 1/2 TABLET EVERY DAY, Disp: 45 tablet, Rfl: 1   warfarin (COUMADIN) 5 MG tablet, TAKE 1 TABLET EVERY DAY, Disp: 90 tablet, Rfl: 3 No current facility-administered medications for this visit.  Facility-Administered Medications Ordered in Other Visits:    durvalumab (IMFINZI) 1,500 mg in sodium chloride 0.9 % 100 mL chemo infusion, 1,500 mg, Intravenous, Once, Creig Hines, MD   heparin lock flush 100 UNIT/ML injection, , , ,    heparin lock flush 100 unit/mL, 500 Units, Intravenous, Once, Creig Hines, MD   heparin lock flush 100 unit/mL, 500 Units, Intracatheter, Once PRN, Creig Hines, MD  Physical exam:  Vitals:   06/12/23 0924  BP: (!) 153/77  Pulse: (!) 45  Temp: (!) 97 F (36.1 C)  TempSrc: Tympanic  Weight: 159 lb 3.2 oz (72.2 kg)  Height: 5\' 8"  (1.727 m)   Physical Exam Cardiovascular:     Rate and Rhythm: Normal rate and regular rhythm.     Heart sounds: Normal heart sounds.  Pulmonary:     Effort: Pulmonary effort is normal.     Breath sounds: Normal breath sounds.  Skin:    General: Skin is warm and dry.  Neurological:     Mental Status: He is alert and oriented to person, place, and time.         Latest Ref Rng & Units 06/12/2023    9:07 AM  CMP  Glucose 70 - 99 mg/dL 811   BUN 8 - 23 mg/dL 19   Creatinine 9.14 - 1.24 mg/dL 7.82   Sodium 956 - 213 mmol/L 138   Potassium 3.5 - 5.1 mmol/L 3.9   Chloride 98 - 111 mmol/L 109   CO2 22 - 32 mmol/L 24   Calcium 8.9 - 10.3 mg/dL 8.5   Total Protein 6.5 - 8.1 g/dL 6.0   Total Bilirubin 0.3 - 1.2 mg/dL 1.0   Alkaline Phos 38 - 126 U/L 58   AST 15 - 41 U/L 22  ALT 0 - 44 U/L 17       Latest Ref Rng & Units 06/12/2023    9:07 AM  CBC  WBC 4.0 - 10.5 K/uL 4.1   Hemoglobin 13.0 - 17.0 g/dL 16.1   Hematocrit 09.6 - 52.0 % 32.0   Platelets 150 - 400 K/uL 121      Assessment and plan- Patient is a 88 y.o. male with history of stage III squamous cell carcinoma of the left upper lobe T3 N2 M0 s/p concurrent weekly CarboTaxol with radiation. He had partial response to treatment.  He is here for on treatment assessment prior to cycle 11 of adjuvant durvalumab  Counts okay to proceed with cycle 11 of adjuvant durvalumab today.  I will see him back in 4 weeks for cycle 12.  Ideally he receives 14 cycles but he did does not wish to take treatment in January due to  concerns of increased co-pay with new insurance.  I will see him back in 4 weeks for cycle 12 and I will plan to get a repeat CT scans at that time as well.  Pancytopenia: Overall mild.  Hemoglobin stable around 10 and platelets fluctuating between 100-120.  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  Continue to monitor   Visit Diagnosis 1. Primary malignant neoplasm of left upper lobe of lung (HCC)   2. Encounter for antineoplastic immunotherapy      Dr. Owens Shark, MD, MPH [facility] [phone] 06/12/2023 10:09 AM

## 2023-06-16 DIAGNOSIS — I482 Chronic atrial fibrillation, unspecified: Secondary | ICD-10-CM | POA: Diagnosis not present

## 2023-06-16 DIAGNOSIS — Z7901 Long term (current) use of anticoagulants: Secondary | ICD-10-CM | POA: Diagnosis not present

## 2023-06-30 DIAGNOSIS — I482 Chronic atrial fibrillation, unspecified: Secondary | ICD-10-CM | POA: Diagnosis not present

## 2023-06-30 DIAGNOSIS — I872 Venous insufficiency (chronic) (peripheral): Secondary | ICD-10-CM | POA: Diagnosis not present

## 2023-06-30 DIAGNOSIS — N400 Enlarged prostate without lower urinary tract symptoms: Secondary | ICD-10-CM | POA: Diagnosis not present

## 2023-06-30 DIAGNOSIS — Z23 Encounter for immunization: Secondary | ICD-10-CM | POA: Diagnosis not present

## 2023-06-30 DIAGNOSIS — I420 Dilated cardiomyopathy: Secondary | ICD-10-CM | POA: Diagnosis not present

## 2023-07-02 DIAGNOSIS — I482 Chronic atrial fibrillation, unspecified: Secondary | ICD-10-CM | POA: Diagnosis not present

## 2023-07-02 DIAGNOSIS — Z7901 Long term (current) use of anticoagulants: Secondary | ICD-10-CM | POA: Diagnosis not present

## 2023-07-10 ENCOUNTER — Inpatient Hospital Stay: Payer: Medicare HMO | Attending: Oncology

## 2023-07-10 ENCOUNTER — Other Ambulatory Visit: Payer: Self-pay | Admitting: Family Medicine

## 2023-07-10 ENCOUNTER — Encounter: Payer: Self-pay | Admitting: Oncology

## 2023-07-10 ENCOUNTER — Inpatient Hospital Stay: Payer: Medicare HMO

## 2023-07-10 ENCOUNTER — Inpatient Hospital Stay (HOSPITAL_BASED_OUTPATIENT_CLINIC_OR_DEPARTMENT_OTHER): Payer: Medicare HMO | Admitting: Oncology

## 2023-07-10 VITALS — BP 160/75 | HR 58 | Temp 97.1°F | Resp 18 | Wt 160.0 lb

## 2023-07-10 DIAGNOSIS — Z79620 Long term (current) use of immunosuppressive biologic: Secondary | ICD-10-CM | POA: Insufficient documentation

## 2023-07-10 DIAGNOSIS — N281 Cyst of kidney, acquired: Secondary | ICD-10-CM | POA: Diagnosis not present

## 2023-07-10 DIAGNOSIS — Z5112 Encounter for antineoplastic immunotherapy: Secondary | ICD-10-CM

## 2023-07-10 DIAGNOSIS — D61818 Other pancytopenia: Secondary | ICD-10-CM | POA: Insufficient documentation

## 2023-07-10 DIAGNOSIS — N400 Enlarged prostate without lower urinary tract symptoms: Secondary | ICD-10-CM | POA: Insufficient documentation

## 2023-07-10 DIAGNOSIS — C3412 Malignant neoplasm of upper lobe, left bronchus or lung: Secondary | ICD-10-CM

## 2023-07-10 DIAGNOSIS — C771 Secondary and unspecified malignant neoplasm of intrathoracic lymph nodes: Secondary | ICD-10-CM | POA: Diagnosis not present

## 2023-07-10 LAB — CBC WITH DIFFERENTIAL/PLATELET
Abs Immature Granulocytes: 0.01 10*3/uL (ref 0.00–0.07)
Basophils Absolute: 0 10*3/uL (ref 0.0–0.1)
Basophils Relative: 1 %
Eosinophils Absolute: 0.2 10*3/uL (ref 0.0–0.5)
Eosinophils Relative: 4 %
HCT: 31.1 % — ABNORMAL LOW (ref 39.0–52.0)
Hemoglobin: 10.3 g/dL — ABNORMAL LOW (ref 13.0–17.0)
Immature Granulocytes: 0 %
Lymphocytes Relative: 15 %
Lymphs Abs: 0.5 10*3/uL — ABNORMAL LOW (ref 0.7–4.0)
MCH: 33.7 pg (ref 26.0–34.0)
MCHC: 33.1 g/dL (ref 30.0–36.0)
MCV: 101.6 fL — ABNORMAL HIGH (ref 80.0–100.0)
Monocytes Absolute: 0.4 10*3/uL (ref 0.1–1.0)
Monocytes Relative: 11 %
Neutro Abs: 2.5 10*3/uL (ref 1.7–7.7)
Neutrophils Relative %: 69 %
Platelets: 109 10*3/uL — ABNORMAL LOW (ref 150–400)
RBC: 3.06 MIL/uL — ABNORMAL LOW (ref 4.22–5.81)
RDW: 14.2 % (ref 11.5–15.5)
WBC: 3.6 10*3/uL — ABNORMAL LOW (ref 4.0–10.5)
nRBC: 0 % (ref 0.0–0.2)

## 2023-07-10 LAB — COMPREHENSIVE METABOLIC PANEL
ALT: 16 U/L (ref 0–44)
AST: 20 U/L (ref 15–41)
Albumin: 3.5 g/dL (ref 3.5–5.0)
Alkaline Phosphatase: 58 U/L (ref 38–126)
Anion gap: 6 (ref 5–15)
BUN: 25 mg/dL — ABNORMAL HIGH (ref 8–23)
CO2: 24 mmol/L (ref 22–32)
Calcium: 8.6 mg/dL — ABNORMAL LOW (ref 8.9–10.3)
Chloride: 111 mmol/L (ref 98–111)
Creatinine, Ser: 1.19 mg/dL (ref 0.61–1.24)
GFR, Estimated: 59 mL/min — ABNORMAL LOW (ref >60)
Glucose, Bld: 132 mg/dL — ABNORMAL HIGH (ref 70–99)
Potassium: 3.7 mmol/L (ref 3.5–5.1)
Sodium: 141 mmol/L (ref 135–145)
Total Bilirubin: 0.9 mg/dL (ref <1.2)
Total Protein: 6 g/dL — ABNORMAL LOW (ref 6.5–8.1)

## 2023-07-10 LAB — TSH: TSH: 1.819 u[IU]/mL (ref 0.350–4.500)

## 2023-07-10 MED ORDER — SODIUM CHLORIDE 0.9% FLUSH
10.0000 mL | INTRAVENOUS | Status: DC | PRN
  Administered 2023-07-10: 10 mL
  Filled 2023-07-10: qty 10

## 2023-07-10 MED ORDER — SODIUM CHLORIDE 0.9 % IV SOLN
1500.0000 mg | Freq: Once | INTRAVENOUS | Status: AC
  Administered 2023-07-10: 1500 mg via INTRAVENOUS
  Filled 2023-07-10: qty 30

## 2023-07-10 MED ORDER — SODIUM CHLORIDE 0.9% FLUSH
10.0000 mL | INTRAVENOUS | Status: DC | PRN
  Administered 2023-07-10: 10 mL via INTRAVENOUS
  Filled 2023-07-10: qty 10

## 2023-07-10 MED ORDER — SODIUM CHLORIDE 0.9 % IV SOLN
Freq: Once | INTRAVENOUS | Status: AC
  Filled 2023-07-10: qty 250

## 2023-07-10 MED ORDER — HEPARIN SOD (PORK) LOCK FLUSH 100 UNIT/ML IV SOLN
500.0000 [IU] | Freq: Once | INTRAVENOUS | Status: AC | PRN
  Administered 2023-07-10: 500 [IU]
  Filled 2023-07-10: qty 5

## 2023-07-10 NOTE — Progress Notes (Signed)
Hematology/Oncology Consult note [facility]  [phone] [phone]  Patient Care Team: Ronnald Ramp, MD as PCP - General (Family Medicine) Vanna Scotland, MD as Consulting Physician (Urology) Lamar Blinks, MD as Consulting Physician (Cardiology) Lockie Mola, MD as Referring Physician (Ophthalmology) Deirdre Evener, MD (Dermatology) Glory Buff, RN as Oncology Nurse Navigator Creig Hines, MD as Consulting Physician (Oncology) Salena Saner, MD as Consulting Physician (Pulmonary Disease)   Name of the [name]  [id number]  [birth year]   Date of visit: 07/10/23  Diagnosis- 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  He was found to have a left upper lobe lung mass measuring 5.5 x 3.7 x 2.8 cm.  It was confluent with the left hilar lymph node.  Enlarged AP window lymph node measuring 16 mm.  Enlarged left paratracheal lymph node.  Low-attenuation 7 mm lesion in the liver.  7 mm cyst in the uncinate process of the pancreas for which follow-up was not recommended.  Bilateral kidney cysts.  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  Prostatic enlargement.   PET CT is can showed hypermetabolic left upper lobe lung mass measuring 5.4 x 3.9 cm with an SUV of 15.1.  Ipsilateral mediastinal lymph node metastases with index lymph node measuring 1.7 with an SUV of 9.8.  No evidence of distant metastatic disease.  Biopsy of the left upper lobe as well as station 4R lymph node was consistent with squamous cell carcinoma   NGS testing did not show any actionable mutations.  PD-L1 35%.   MSI stable.  MRI brain was negative for metastatic disease   Patient completed concurrent chemoradiation with weekly CarboTaxol chemotherapy in December 2023.  Scans following that showed partial response to treatment.  Patient started adjuvant durvalumab in January 2024    Interval history-tolerating treatments well without any significant side effects.  Occasional cough which is overall self-limited  ECOG PS- 1 Pain scale- 0   Review of systems- Review of Systems  Constitutional:  Negative for chills, fever, malaise/fatigue and weight loss.  HENT:  Negative for congestion, ear discharge and nosebleeds.   Eyes:  Negative for blurred vision.  Respiratory:  Negative for cough, hemoptysis, sputum production, shortness of breath and wheezing.   Cardiovascular:  Negative for chest pain, palpitations, orthopnea and claudication.  Gastrointestinal:  Negative for abdominal pain, blood in stool, constipation, diarrhea, heartburn, melena, nausea and vomiting.  Genitourinary:  Negative for dysuria, flank pain, frequency, hematuria and urgency.  Musculoskeletal:  Negative for back pain, joint pain and myalgias.  Skin:  Negative for rash.  Neurological:  Negative for dizziness, tingling, focal weakness, seizures, weakness and headaches.  Endo/Heme/Allergies:  Does not bruise/bleed easily.  Psychiatric/Behavioral:  Negative for depression and suicidal ideas. The patient does not have insomnia.       Allergies  Allergen Reactions   Cefdinir Nausea Only    hypersensitivity to smell   Doxycycline     Sun sensitivity   Prednisone     Hallucinations    Sertraline Other (See Comments)    Hallucinations      Past Medical History:  Diagnosis Date   A-fib (  HCC)    a.) CHA2DS2-VASc = 6 (age x2, HTN, DVT x2, vascular disease history). b.) rate/rhythm maintained on oral carvedilol; chronically anticoagulated using daily warfarin.   Actinic keratosis    Anemia    Aortic atherosclerosis (HCC)     Bilateral renal cysts    a.)  CT abdomen pelvis 04/24/2022: Enhancing septation of the lateral aspect of the mid RIGHT kidney measuring 2.7 x 2.8 cm concerning for cystic renal neoplasm   Bladder calculi    BPH (benign prostatic hyperplasia)    DDD (degenerative disc disease), lumbar    a.) s/p LEFT laminectomy L5   Diverticulosis    DOE (dyspnea on exertion)    DVT (deep venous thrombosis) (HCC)    Elevated PSA    GERD (gastroesophageal reflux disease)    H/O degenerative disc disease    HLD (hyperlipidemia)    Hypertension    Lipoma of arm    Lipomatosis    Long term current use of anticoagulant    a.) warfarin   Lung cancer (HCC)    Mass of upper lobe of left lung 04/24/2022   a.)  CT chest 04/24/2022: Multilobulated perihilar mass measuring 5.3 x 3.7 x 2.8 cm with associated LEFT hilar and mediastinal LAD; b.)  PET CT 05/07/2022: Hypermetabolic LEFT upper lobe lung mass (max SUV 15.1); ipsilateral nodal metastasis --> presuming a NSC histology, 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   NICM (nonischemic cardiomyopathy) (HCC) 11/22/2015   a.) TTE 11/22/2015: EF 40%. b.) TTE 12/27/2018: EF 45%. c.) TTE 08/30/2020: EF 45-50%.   Night sweats    Obesity    Osteoarthritis    RBBB (right bundle branch block)    Right inguinal hernia    VHD (valvular heart disease) 11/22/2015   a.) TTE 11/22/2015: EF 40%; mod MR, mod-sev TR, triv PR; mod BAE, mild RV enlargement. b.) TTE 12/27/2018: EF 45%; mod MR, mod-sev TR, triv PR; mod BAE, mild RV enlargement. c.) TTE 08/30/2020: EF 45-50%; mild MR/TR.     Past Surgical History:  Procedure Laterality Date   Bilateral Radical Keratotomy Bilateral 1997   COLONOSCOPY WITH PROPOFOL N/A 01/11/2016   Procedure: COLONOSCOPY WITH PROPOFOL;  Surgeon: Scot Jun, MD;  Location: [facility] ENDOSCOPY;  Service: Endoscopy;  Laterality: N/A; repeat 3 years, tubular adenoma   DENTAL SURGERY     Patient had implants    FLEXIBLE BRONCHOSCOPY Left 05/19/2022   Procedure: FLEXIBLE BRONCHOSCOPY;  Surgeon: Salena Saner, MD;  Location: [facility] ORS;  Service: Cardiopulmonary;  Laterality: Left;   GUM SURGERY     IR IMAGING GUIDED PORT INSERTION  06/13/2022   L4 - S1 decompression Left    PROSTATE BIOPSY     TONSILLECTOMY AND ADENOIDECTOMY  age 39   VIDEO BRONCHOSCOPY WITH ENDOBRONCHIAL ULTRASOUND Left 05/19/2022   Procedure: VIDEO BRONCHOSCOPY WITH ENDOBRONCHIAL ULTRASOUND;  Surgeon: Salena Saner, MD;  Location: [facility] ORS;  Service: Cardiopulmonary;  Laterality: Left;   XI ROBOTIC ASSISTED INGUINAL HERNIA REPAIR WITH MESH Right 10/21/2021   Procedure: XI ROBOTIC ASSISTED INGUINAL HERNIA REPAIR WITH MESH;  Surgeon: Campbell Lerner, MD;  Location: [facility] ORS;  Service: General;  Laterality: Right;    Social History   Socioeconomic History   Marital status: Married    Spouse name: Doris   Number of children: 3   Years of education: Not on file   Highest education level: Associate degree: occupational, Scientist, product/process development, or vocational program  Occupational History   Occupation:  retired  Tobacco Use   Smoking status: Former    Current packs/day: 0.00    Types: Cigarettes    Quit date: 08/26/1971    Years since quitting: 51.9    Passive exposure: Never   Smokeless tobacco: Never   Tobacco comments:    Smoked for about 20 years.  Vaping Use   Vaping status: Never Used  Substance and Sexual Activity   Alcohol use: Not Currently   Drug use: No   Sexual activity: Not Currently  Other Topics Concern   Not on file  Social History Narrative   Not on file   Social Determinants of Health   Financial Resource Strain: Low Risk  (03/20/2023)   Received from [facility]   Overall Financial Resource Strain (CARDIA)    Difficulty of Paying Living Expenses: Not very hard  Food Insecurity: No Food Insecurity (03/20/2023)   Received from [facility]   Hunger Vital Sign    Worried  About Running Out of Food in the Last Year: Never true    Ran Out of Food in the Last Year: Never true  Transportation Needs: No Transportation Needs (03/20/2023)   Received from [facility]    In the past 12 months, has lack of transportation kept you from medical appointments or from getting medications?: No    Lack of Transportation (Non-Medical): No  Physical Activity: Sufficiently Active (12/23/2022)   Exercise Vital Sign    Days of Exercise per Week: 7 days    Minutes of Exercise per Session: 50 min  Stress: No Stress Concern Present (12/18/2021)   [employer] of Occupational Health - Occupational Stress Questionnaire    Feeling of Stress : Only a little  Social Connections: Moderately Integrated (12/23/2022)   Social Connection and Isolation Panel [NHANES]    Frequency of Communication with Friends and Family: Twice a week    Frequency of Social Gatherings with Friends and Family: Once a week    Attends Religious Services: More than 4 times per year    Active Member of [employer] or Organizations: No    Attends Banker Meetings: Never    Marital Status: Married  Catering manager Violence: Not At Risk (12/23/2022)   Humiliation, Afraid, Rape, and Kick questionnaire    Fear of Current or Ex-Partner: No    Emotionally Abused: No    Physically Abused: No    Sexually Abused: No    Family History  Problem Relation Age of Onset   Cancer Mother        secondary to cancinoma of the jaw from her dipping snuff.   Heart attack Father    CAD Father    Hypertension Sister    Diabetes Son        Type I diabetes   Prostate cancer Neg Hx    Kidney cancer Neg Hx      Current Outpatient Medications:    amLODipine (NORVASC) 5 MG tablet, TAKE 1 TABLET (5 MG TOTAL) BY MOUTH DAILY., Disp: 90 tablet, Rfl: 3   benazepril (LOTENSIN) 40 MG tablet, Take 1 tablet (40 mg total) by mouth daily., Disp: 90 tablet, Rfl: 1   betamethasone  dipropionate 0.05 % cream, Apply to aa BID PRN. Use no more than 4 weeks., Disp: 45 g, Rfl: 0   carvedilol (COREG) 6.25 MG tablet, TAKE 1 TABLET (6.25 MG TOTAL) BY MOUTH 2 (TWO) TIMES DAILY., Disp: 180 tablet, Rfl: 3  Cyanocobalamin (VITAMIN B 12 PO), Take 1 capsule by mouth 2 (two) times daily. 1000 mcg each capsule, Disp: , Rfl:    doxazosin (CARDURA) 8 MG tablet, TAKE 1 TABLET (8 MG TOTAL) BY MOUTH DAILY., Disp: 90 tablet, Rfl: 3   finasteride (PROSCAR) 5 MG tablet, TAKE 1 TABLET EVERY DAY, Disp: 90 tablet, Rfl: 3   gabapentin (NEURONTIN) 300 MG capsule, TAKE 1 CAPSULE EVERY MORNING, 1 CAPSULE IN THE AFTERNOON AND 2 CAPSULES AT BEDTIME, Disp: 360 capsule, Rfl: 3   Magnesium 500 MG TABS, Take 500 mg by mouth daily., Disp: , Rfl:    Multiple Vitamin (MULTI-VITAMIN) tablet, Take 1 tablet by mouth daily. , Disp: , Rfl:    omeprazole (PRILOSEC) 20 MG capsule, Take 20 mg by mouth daily., Disp: , Rfl:    oxyCODONE (OXY IR/ROXICODONE) 5 MG immediate release tablet, Take 1 tablet (5 mg total) by mouth every 6 (six) hours as needed for severe pain., Disp: 90 tablet, Rfl: 0   tacrolimus (PROTOPIC) 0.1 % ointment, Apply topically 2 (two) times daily. Apply as directed. Refer to patient print out, Disp: 60 g, Rfl: 2   triamcinolone cream (KENALOG) 0.1 %, Apply topically to affected area of right lower leg twice daily for 4 weeks. Avoid applying to face, groin, and axilla. Use as directed., Disp: 45 g, Rfl: 0   triamterene-hydrochlorothiazide (MAXZIDE-25) 37.5-25 MG tablet, TAKE 1/2 TABLET EVERY DAY, Disp: 45 tablet, Rfl: 1   warfarin (COUMADIN) 5 MG tablet, TAKE 1 TABLET EVERY DAY, Disp: 90 tablet, Rfl: 3 No current facility-administered medications for this visit.  Facility-Administered Medications Ordered in Other Visits:    heparin lock flush 100 UNIT/ML injection, , , ,   Physical exam:  Vitals:   07/10/23 0912  BP: (!) 160/75  Pulse: (!) 58  Resp: 18  Temp: (!) 97.1 F (36.2 C)  TempSrc:  Tympanic  SpO2: 100%  Weight: 160 lb (72.6 kg)   Physical Exam Cardiovascular:     Rate and Rhythm: Normal rate and regular rhythm.     Heart sounds: Normal heart sounds.  Pulmonary:     Effort: Pulmonary effort is normal.     Breath sounds: Normal breath sounds.  Skin:    General: Skin is warm and dry.  Neurological:     Mental Status: He is alert and oriented to person, place, and time.         Latest Ref Rng & Units 07/10/2023    8:44 AM  CMP  Glucose 70 - 99 mg/dL 409   BUN 8 - 23 mg/dL 25   Creatinine 8.11 - 1.24 mg/dL 9.14   Sodium 782 - 956 mmol/L 141   Potassium 3.5 - 5.1 mmol/L 3.7   Chloride 98 - 111 mmol/L 111   CO2 22 - 32 mmol/L 24   Calcium 8.9 - 10.3 mg/dL 8.6   Total Protein 6.5 - 8.1 g/dL 6.0   Total Bilirubin <2.1 mg/dL 0.9   Alkaline Phos 38 - 126 U/L 58   AST 15 - 41 U/L 20   ALT 0 - 44 U/L 16       Latest Ref Rng & Units 07/10/2023    8:44 AM  CBC  WBC 4.0 - 10.5 K/uL 3.6   Hemoglobin 13.0 - 17.0 g/dL 30.8   Hematocrit 65.7 - 52.0 % 31.1   Platelets 150 - 400 K/uL 109      Assessment and plan- Patient is a 88 y.o. male with history of stage  III squamous cell carcinoma of the left upper lobe T3 N2 M0 s/p concurrent weekly CarboTaxol with radiation. He had partial response to treatment.  He is here for on treatment assessment prior to cycle 12 of adjuvant durvalumab  Counts okay to proceed with cycle 12 of adjuvant durvalumab today.  I will see him back in 4 weeks for cycle 13 which will be his last cycle.  Patient does not wish to take treatments next year if possible given his high co-pay.  Plan to repeat CT chest abdomen and pelvis with contrast in 2 weeks.  Pancytopenia: Overall stable.  Continue to monitor  Patient will continue to keep his port in place and it will be flushed every 3 months   Visit Diagnosis 1. Primary malignant neoplasm of left upper lobe of lung (HCC)   2. Encounter for antineoplastic immunotherapy      Dr.  Owens Shark, MD, MPH [facility] at [facility] [phone] 07/10/2023 9:34 AM

## 2023-07-10 NOTE — Telephone Encounter (Signed)
Requested Prescriptions  Pending Prescriptions Disp Refills   doxazosin (CARDURA) 8 MG tablet [Pharmacy Med Name: Doxazosin Mesylate Oral Tablet 8 MG] 90 tablet 1    Sig: TAKE 1 TABLET EVERY DAY     Cardiovascular:  Alpha Blockers Failed - 07/10/2023  2:53 AM      Failed - Last BP in normal range    BP Readings from Last 1 Encounters:  07/10/23 (!) 160/75         Passed - Valid encounter within last 6 months    Recent Outpatient Visits           4 months ago Annual physical exam   [place] [facility] Simmons-Robinson, [place], MD   6 months ago Chronic venous insufficiency   [facility]Rob[place], [place], MD   11 months ago Essential (primary) hypertension   [place] [facility]-Robinson, [place], MD   1 year ago Weight loss, unintentional   [facility] Bosie Clos, MD   1 year ago Weight loss   [facility] Bosie Clos, MD       Future Appointments             In 3 days Willeen Niece, MD [facility]

## 2023-07-10 NOTE — Patient Instructions (Signed)
[facility] - A DEPT OF [facility]  Discharge Instructions: Thank you for choosing [facility] to provide your oncology and hematology care.  If you have a lab appointment with the Cancer Center, please go directly to the Cancer Center and check in at the registration area.  Wear comfortable clothing and clothing appropriate for easy access to any Portacath or PICC line.   We strive to give you quality time with your provider. You may need to reschedule your appointment if you arrive late (15 or more minutes).  Arriving late affects you and other patients whose appointments are after yours.  Also, if you miss three or more appointments without notifying the office, you may be dismissed from the clinic at the provider's discretion.      For prescription refill requests, have your pharmacy contact our office and allow 72 hours for refills to be completed.    Today you received the following chemotherapy and/or immunotherapy agents: durvalumab    To help prevent nausea and vomiting after your treatment, we encourage you to take your nausea medication as directed.  BELOW ARE SYMPTOMS THAT SHOULD BE REPORTED IMMEDIATELY: *FEVER GREATER THAN 100.4 F (38 C) OR HIGHER *CHILLS OR SWEATING *NAUSEA AND VOMITING THAT IS NOT CONTROLLED WITH YOUR NAUSEA MEDICATION *UNUSUAL SHORTNESS OF BREATH *UNUSUAL BRUISING OR BLEEDING *URINARY PROBLEMS (pain or burning when urinating, or frequent urination) *BOWEL PROBLEMS (unusual diarrhea, constipation, pain near the anus) TENDERNESS IN MOUTH AND THROAT WITH OR WITHOUT PRESENCE OF ULCERS (sore throat, sores in mouth, or a toothache) UNUSUAL RASH, SWELLING OR PAIN  UNUSUAL VAGINAL DISCHARGE OR ITCHING   Items with * indicate a potential emergency and should be followed up as soon as possible or go to the Emergency Department if any problems should occur.  Please show the CHEMOTHERAPY ALERT CARD or IMMUNOTHERAPY  ALERT CARD at check-in to the Emergency Department and triage nurse.  Should you have questions after your visit or need to cancel or reschedule your appointment, please contact [facility] - A DEPT OF [facility]  [phone] and follow the prompts.  Office hours are 8:00 a.m. to 4:30 p.m. Monday - Friday. Please note that voicemails left after 4:00 p.m. may not be returned until the following business day.  We are closed weekends and major holidays. You have access to a nurse at all times for urgent questions. Please call the main number to the clinic [phone] and follow the prompts.  For any non-urgent questions, you may also contact your provider using MyChart. We now offer e-Visits for anyone 84 and older to request care online for non-urgent symptoms. For details visit [email or web address].   Also download the MyChart app! Go to the app store, search "MyChart", open the app, select [facility], and log in with your MyChart username and password.

## 2023-07-11 LAB — T4: T4, Total: 7.2 ug/dL (ref 4.5–12.0)

## 2023-07-12 ENCOUNTER — Other Ambulatory Visit: Payer: Self-pay

## 2023-07-13 ENCOUNTER — Ambulatory Visit (INDEPENDENT_AMBULATORY_CARE_PROVIDER_SITE_OTHER): Payer: Medicare HMO | Admitting: Dermatology

## 2023-07-13 ENCOUNTER — Encounter: Payer: Self-pay | Admitting: Dermatology

## 2023-07-13 DIAGNOSIS — M793 Panniculitis, unspecified: Secondary | ICD-10-CM | POA: Diagnosis not present

## 2023-07-13 DIAGNOSIS — L578 Other skin changes due to chronic exposure to nonionizing radiation: Secondary | ICD-10-CM | POA: Diagnosis not present

## 2023-07-13 DIAGNOSIS — L570 Actinic keratosis: Secondary | ICD-10-CM

## 2023-07-13 DIAGNOSIS — D2239 Melanocytic nevi of other parts of face: Secondary | ICD-10-CM | POA: Diagnosis not present

## 2023-07-13 DIAGNOSIS — L280 Lichen simplex chronicus: Secondary | ICD-10-CM

## 2023-07-13 DIAGNOSIS — W908XXA Exposure to other nonionizing radiation, initial encounter: Secondary | ICD-10-CM | POA: Diagnosis not present

## 2023-07-13 DIAGNOSIS — D229 Melanocytic nevi, unspecified: Secondary | ICD-10-CM

## 2023-07-13 NOTE — Patient Instructions (Addendum)
For Right lower leg Continue compression socks daily Continue Tacrolimus ointment 0.1% nightly before bed If area flares again restart Betamethasone dipropionate cream 1-2 times a day for 2 to 4 weeks, avoid face, groin, axilla  For Buttocks Continue Tacrolimus ointment 0.1% once daily as needed  Cryotherapy Aftercare  Wash gently with soap and water everyday.   Apply Vaseline and Band-Aid daily until healed.   Due to recent changes in healthcare laws, you may see results of your pathology and/or laboratory studies on MyChart before the doctors have had a chance to review them. We understand that in some cases there may be results that are confusing or concerning to you. Please understand that not all results are received at the same time and often the doctors may need to interpret multiple results in order to provide you with the best plan of care or course of treatment. Therefore, we ask that you please give [place] 2 business days to thoroughly review all your results before contacting the office for clarification. Should we see a critical lab result, you will be contacted sooner.   If You Need Anything After Your Visit  If you have any questions or concerns for your doctor, please call our main line at [phone] and press option 4 to reach your doctor's medical assistant. If no one answers, please leave a voicemail as directed and we will return your call as soon as possible. Messages left after 4 pm will be answered the following business day.   You may also send [place] a message via MyChart. We typically respond to MyChart messages within 1-2 business days.  For prescription refills, please ask your pharmacy to contact our office. Our fax number is [phone].  If you have an urgent issue when the clinic is closed that cannot wait until the next business day, you can page your doctor at the number below.    Please note that while we do our best to be available for urgent issues outside of  office hours, we are not available 24/7.   If you have an urgent issue and are unable to reach [place], you may choose to seek medical care at your doctor's office, retail clinic, urgent care center, or emergency room.  If you have a medical emergency, please immediately call 911 or go to the emergency department.  Pager Numbers  - Dr. Gwen Pounds: [phone]  - Dr. Roseanne Reno: [phone]  - Dr. Katrinka Blazing: [phone]   In the event of inclement weather, please call our main line at [phone] for an update on the status of any delays or closures.  Dermatology Medication Tips: Please keep the boxes that topical medications come in in order to help keep track of the instructions about where and how to use these. Pharmacies typically print the medication instructions only on the boxes and not directly on the medication tubes.   If your medication is too expensive, please contact our office at [phone] option 4 or send [place] a message through MyChart.   We are unable to tell what your co-pay for medications will be in advance as this is different depending on your insurance coverage. However, we may be able to find a substitute medication at lower cost or fill out paperwork to get insurance to cover a needed medication.   If a prior authorization is required to get your medication covered by your insurance company, please allow [place] 1-2 business days to complete this process.  Drug prices often vary depending on where the prescription  is filled and some pharmacies may offer cheaper prices.  The website www.goodrx.com contains coupons for medications through different pharmacies. The prices here do not account for what the cost may be with help from insurance (it may be cheaper with your insurance), but the website can give you the price if you did not use any insurance.  - You can print the associated coupon and take it with your prescription to the pharmacy.  - You may also stop by our office  during regular business hours and pick up a GoodRx coupon card.  - If you need your prescription sent electronically to a different pharmacy, notify our office through [facility] or by phone at [phone] option 4.     Si Usted Necesita Algo Despus de Su Visita  Tambin puede enviarnos un mensaje a travs de Clinical cytogeneticist. Por lo general respondemos a los mensajes de MyChart en el transcurso de 1 a 2 das hbiles.  Para renovar recetas, por favor pida a su farmacia que se ponga en contacto con nuestra oficina. Annie Sable de fax es [place] [phone].  Si tiene un asunto urgente cuando la clnica est cerrada y que no puede esperar hasta el siguiente da hbil, puede llamar/localizar a su doctor(a) al nmero que aparece a continuacin.   Por favor, tenga en cuenta que aunque hacemos todo lo posible para estar disponibles para asuntos urgentes fuera del horario de [place], no estamos disponibles las 24 horas del da, los 7 [address]  [address] de la [place].   Si tiene un problema urgente y no puede comunicarse con nosotros, puede optar por buscar atencin mdica  en el consultorio de su doctor(a), en una clnica privada, en un centro de atencin urgente o en una sala de emergencias.  Si tiene Engineer, drilling, por favor llame inmediatamente al 911 o vaya a la sala de emergencias.  Nmeros de bper  - Dr. Gwen Pounds: [phone]  - Dra. Roseanne Reno: [phone]  - Dr. Katrinka Blazing: [phone]   En caso de inclemencias del tiempo, por favor llame a Lacy Duverney principal al [phone] para una actualizacin sobre el [place] de cualquier retraso o cierre.  Consejos para la medicacin en dermatologa: Por favor, guarde las cajas en las que vienen los medicamentos de uso tpico para ayudarle a seguir las instrucciones sobre dnde y cmo usarlos. Las farmacias generalmente imprimen las instrucciones del medicamento slo en las cajas y no directamente en los tubos del [place].   Si su medicamento es  muy caro, por favor, pngase en contacto con Rolm Gala llamando al [phone] y presione la opcin 4 o envenos un mensaje a travs de Clinical cytogeneticist.   No podemos decirle cul ser su copago por los medicamentos por adelantado ya que esto es diferente dependiendo de la cobertura de su seguro. Sin embargo, es posible que podamos encontrar un medicamento sustituto a Audiological scientist un formulario para que el seguro cubra el medicamento que se considera necesario.   Si se requiere una autorizacin previa para que su compaa de seguros [place] su medicamento, por favor permtanos de 1 a 2 das hbiles para completar [address]  Los precios de los medicamentos varan con frecuencia dependiendo del Environmental consultant de dnde se surte la receta y alguna farmacias pueden ofrecer precios ms baratos.  El sitio web www.goodrx.com tiene cupones para medicamentos de Health and safety inspector. Los precios aqu no tienen en cuenta lo que podra costar con la ayuda del seguro (puede ser ms barato con su seguro), pero el sitio web  puede darle el precio si no utiliz [employer].  - Puede imprimir el cupn correspondiente y llevarlo con su receta a la farmacia.  - Tambin puede pasar por nuestra oficina durante el horario de atencin regular y Education officer, museum una tarjeta de cupones de GoodRx.  - Si necesita que su receta se enve electrnicamente a una farmacia diferente, informe a nuestra oficina a travs de MyChart de [place] o por telfono llamando al [phone] y presione la opcin 4.

## 2023-07-13 NOTE — Progress Notes (Signed)
Follow-Up Visit   Subjective  Casey Gallegos is a 88 y.o. male who presents for the following: Lipodermatosclerosis R lat lower leg used Diprolene oint at bedtime initally on leg, currently using Tacrolimus ointment, LSC gluteal cleft, barrier cream, Tacrolimus 0.1% oint at bedtime.  Both conditions are much improved. The patient has spots, moles and lesions to be evaluated, some may be new or changing and the patient may have concern these could be cancer.   The following portions of the chart were reviewed this encounter and updated as appropriate: medications, allergies, medical history  Review of Systems:  No other skin or systemic complaints except as noted in HPI or Assessment and Plan.  Objective  Well appearing patient in no apparent distress; mood and affect are within normal limits.   A focused examination was performed of the following areas: Right lower leg, face, neck  Relevant exam findings are noted in the Assessment and Plan.  L post auricular neck x 1, R post auricular neck x 2 (3) Pink scaly macules    Assessment & Plan   LIPODERMATOSCLEROSIS R lateral lower leg Exam: violaceous patch with some surrounding hyperpigmentation R lat lower leg, nontender  Chronic and persistent condition with duration or expected duration over one year. Condition is improving with treatment but not currently at goal.  Counseling: Lipodermatosclerosis is a chronic inflammatory condition of unknown cause of the subcutaneous fat causing tenderness, discoloration and hardening of the involved skin, most commonly on the lower legs.  It may progress and gradually worsen over time, especially in the setting of chronic leg swelling. Daily compression stockings/hose is recommended.  Topical and intralesional steroids; Therapeutic Ultrasound and some systemic medications may be helpful.  Consultation with Vascular specialists may yield other options.    Treatment Plan: Cont Compression  socks daily Cont Tacrolimus 0.1% oint qhs If starts to flare again restart Betamethasone Dipropionate cr qd/bid up to 2-4 weeks, avoid f/g/a   Lichen Simplex Chronicus (LSC) Gluteal cleft Exam: clear per patient, not examined today  Chronic condition with duration or expected duration over one year. Currently well-controlled.   Patient Education: -LSC is a common form of chronic neurodermatitis that presents as dry, patchy areas of skin that are scaly and thick. The hypertrophic epidermis generally seen is typically the result of habitual scratching or rubbing of a specific area of the skin.  Treatment Plan: -Encouraged pt to avoid sitting long time without short walking breaks Continue desitin/vaseline/aquaphor barrier cream to aa daily  Cont Tacrolimus ointment 0.1% qd prn flares  AK (actinic keratosis) (3) L post auricular neck x 1, R post auricular neck x 2  Vs ISKs  Actinic keratoses are precancerous spots that appear secondary to cumulative UV radiation exposure/sun exposure over time. They are chronic with expected duration over 1 year. A portion of actinic keratoses will progress to squamous cell carcinoma of the skin. It is not possible to reliably predict which spots will progress to skin cancer and so treatment is recommended to prevent development of skin cancer.  Recommend daily broad spectrum sunscreen SPF 30+ to sun-exposed areas, reapply every 2 hours as needed.  Recommend staying in the shade or wearing long sleeves, sun glasses (UVA+UVB protection) and wide brim hats (4-inch brim around the entire circumference of the hat). Call for new or changing lesions.  Destruction of lesion - L post auricular neck x 1, R post auricular neck x 2 (3)  Destruction method: cryotherapy   Informed consent: discussed and  consent obtained   Lesion destroyed using liquid nitrogen: Yes   Region frozen until ice ball extended beyond lesion: Yes   Outcome: patient tolerated procedure  well with no complications   Post-procedure details: wound care instructions given   Additional details:  Prior to procedure, discussed risks of blister formation, small wound, skin dyspigmentation, or rare scar following cryotherapy. Recommend Vaseline ointment to treated areas while healing.     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 Recommend observation. Call clinic for new or changing moles. Recommend daily use of broad spectrum spf 30+ sunscreen to sun-exposed areas.   ACTINIC DAMAGE - chronic, secondary to cumulative UV radiation exposure/sun exposure over time - diffuse scaly erythematous macules with underlying dyspigmentation - Recommend daily broad spectrum sunscreen SPF 30+ to sun-exposed areas, reapply every 2 hours as needed.  - Recommend staying in the shade or wearing long sleeves, sun glasses (UVA+UVB protection) and wide brim hats (4-inch brim around the entire circumference of the hat). - Call for new or changing lesions.   Return in about 6 months (around 01/10/2024) for lipodermatosclerosis, LSC, AKs.UBSE  I, Ardis Rowan, RMA, am acting as scribe for Willeen Niece, MD .   Documentation: I have reviewed the above documentation for accuracy and completeness, and I agree with the above.  Willeen Niece, MD

## 2023-07-20 DIAGNOSIS — I482 Chronic atrial fibrillation, unspecified: Secondary | ICD-10-CM | POA: Diagnosis not present

## 2023-07-20 DIAGNOSIS — Z7901 Long term (current) use of anticoagulants: Secondary | ICD-10-CM | POA: Diagnosis not present

## 2023-07-21 ENCOUNTER — Ambulatory Visit
Admission: RE | Admit: 2023-07-21 | Discharge: 2023-07-21 | Disposition: A | Discharge status: Home / Self Care | Payer: Medicare HMO | Source: Ambulatory Visit | Attending: Oncology | Admitting: Oncology

## 2023-07-21 DIAGNOSIS — J90 Pleural effusion, not elsewhere classified: Secondary | ICD-10-CM | POA: Diagnosis not present

## 2023-07-21 DIAGNOSIS — R932 Abnormal findings on diagnostic imaging of liver and biliary tract: Secondary | ICD-10-CM | POA: Diagnosis not present

## 2023-07-21 DIAGNOSIS — C3412 Malignant neoplasm of upper lobe, left bronchus or lung: Secondary | ICD-10-CM | POA: Insufficient documentation

## 2023-07-21 DIAGNOSIS — I700 Atherosclerosis of aorta: Secondary | ICD-10-CM | POA: Diagnosis not present

## 2023-07-21 MED ORDER — HEPARIN SOD (PORK) LOCK FLUSH 100 UNIT/ML IV SOLN
500.0000 [IU] | Freq: Once | INTRAVENOUS | Status: AC
  Administered 2023-07-21: 500 [IU] via INTRAVENOUS
  Filled 2023-07-21: qty 5

## 2023-07-21 MED ORDER — IOHEXOL 300 MG/ML  SOLN
100.0000 mL | Freq: Once | INTRAMUSCULAR | Status: AC | PRN
  Administered 2023-07-21: 100 mL via INTRAVENOUS

## 2023-07-21 MED ORDER — HEPARIN SOD (PORK) LOCK FLUSH 100 UNIT/ML IV SOLN
INTRAVENOUS | Status: AC
  Filled 2023-07-21: qty 5

## 2023-08-07 ENCOUNTER — Encounter: Payer: Self-pay | Admitting: Oncology

## 2023-08-07 ENCOUNTER — Inpatient Hospital Stay: Payer: Medicare HMO

## 2023-08-07 ENCOUNTER — Inpatient Hospital Stay: Payer: Medicare HMO | Attending: Oncology

## 2023-08-07 ENCOUNTER — Inpatient Hospital Stay (HOSPITAL_BASED_OUTPATIENT_CLINIC_OR_DEPARTMENT_OTHER): Payer: Medicare HMO | Admitting: Oncology

## 2023-08-07 VITALS — BP 146/66 | HR 57 | Resp 16

## 2023-08-07 VITALS — BP 129/70 | HR 63 | Temp 98.9°F | Resp 20 | Wt 154.3 lb

## 2023-08-07 DIAGNOSIS — Z79620 Long term (current) use of immunosuppressive biologic: Secondary | ICD-10-CM | POA: Diagnosis not present

## 2023-08-07 DIAGNOSIS — C771 Secondary and unspecified malignant neoplasm of intrathoracic lymph nodes: Secondary | ICD-10-CM | POA: Insufficient documentation

## 2023-08-07 DIAGNOSIS — C3412 Malignant neoplasm of upper lobe, left bronchus or lung: Secondary | ICD-10-CM

## 2023-08-07 DIAGNOSIS — N281 Cyst of kidney, acquired: Secondary | ICD-10-CM | POA: Insufficient documentation

## 2023-08-07 DIAGNOSIS — Z5112 Encounter for antineoplastic immunotherapy: Secondary | ICD-10-CM

## 2023-08-07 LAB — CBC WITH DIFFERENTIAL/PLATELET
Abs Immature Granulocytes: 0.03 10*3/uL (ref 0.00–0.07)
Basophils Absolute: 0.1 10*3/uL (ref 0.0–0.1)
Basophils Relative: 1 %
Eosinophils Absolute: 0.1 10*3/uL (ref 0.0–0.5)
Eosinophils Relative: 3 %
HCT: 31.4 % — ABNORMAL LOW (ref 39.0–52.0)
Hemoglobin: 10.5 g/dL — ABNORMAL LOW (ref 13.0–17.0)
Immature Granulocytes: 1 %
Lymphocytes Relative: 14 %
Lymphs Abs: 0.6 10*3/uL — ABNORMAL LOW (ref 0.7–4.0)
MCH: 33.8 pg (ref 26.0–34.0)
MCHC: 33.4 g/dL (ref 30.0–36.0)
MCV: 101 fL — ABNORMAL HIGH (ref 80.0–100.0)
Monocytes Absolute: 0.5 10*3/uL (ref 0.1–1.0)
Monocytes Relative: 12 %
Neutro Abs: 3 10*3/uL (ref 1.7–7.7)
Neutrophils Relative %: 69 %
Platelets: 153 10*3/uL (ref 150–400)
RBC: 3.11 MIL/uL — ABNORMAL LOW (ref 4.22–5.81)
RDW: 14.3 % (ref 11.5–15.5)
WBC: 4.4 10*3/uL (ref 4.0–10.5)
nRBC: 0 % (ref 0.0–0.2)

## 2023-08-07 LAB — COMPREHENSIVE METABOLIC PANEL
ALT: 14 U/L (ref 0–44)
AST: 17 U/L (ref 15–41)
Albumin: 3.4 g/dL — ABNORMAL LOW (ref 3.5–5.0)
Alkaline Phosphatase: 55 U/L (ref 38–126)
Anion gap: 5 (ref 5–15)
BUN: 25 mg/dL — ABNORMAL HIGH (ref 8–23)
CO2: 25 mmol/L (ref 22–32)
Calcium: 8.6 mg/dL — ABNORMAL LOW (ref 8.9–10.3)
Chloride: 110 mmol/L (ref 98–111)
Creatinine, Ser: 0.99 mg/dL (ref 0.61–1.24)
GFR, Estimated: >60 mL/min (ref >60)
Glucose, Bld: 99 mg/dL (ref 70–99)
Potassium: 4.3 mmol/L (ref 3.5–5.1)
Sodium: 140 mmol/L (ref 135–145)
Total Bilirubin: 0.5 mg/dL (ref <1.2)
Total Protein: 5.9 g/dL — ABNORMAL LOW (ref 6.5–8.1)

## 2023-08-07 LAB — TSH: TSH: 2.511 u[IU]/mL (ref 0.350–4.500)

## 2023-08-07 MED ORDER — SODIUM CHLORIDE 0.9 % IV SOLN
Freq: Once | INTRAVENOUS | Status: AC
  Filled 2023-08-07: qty 250

## 2023-08-07 MED ORDER — SODIUM CHLORIDE 0.9 % IV SOLN
1500.0000 mg | Freq: Once | INTRAVENOUS | Status: AC
  Administered 2023-08-07: 1500 mg via INTRAVENOUS
  Filled 2023-08-07: qty 30

## 2023-08-07 MED ORDER — HEPARIN SOD (PORK) LOCK FLUSH 100 UNIT/ML IV SOLN
500.0000 [IU] | Freq: Once | INTRAVENOUS | Status: AC | PRN
  Administered 2023-08-07: 500 [IU]
  Filled 2023-08-07: qty 5

## 2023-08-07 NOTE — Patient Instructions (Signed)
 [facility] - A DEPT OF [facility]  Discharge Instructions: Thank you for choosing [facility] to provide your oncology and hematology care.  If you have a lab appointment with the Cancer Center, please go directly to the Cancer Center and check in at the registration area.  Wear comfortable clothing and clothing appropriate for easy access to any Portacath or PICC line.   We strive to give you quality time with your provider. You may need to reschedule your appointment if you arrive late (15 or more minutes).  Arriving late affects you and other patients whose appointments are after yours.  Also, if you miss three or more appointments without notifying the office, you may be dismissed from the clinic at the provider's discretion.      For prescription refill requests, have your pharmacy contact our office and allow 72 hours for refills to be completed.    Today you received the following chemotherapy and/or immunotherapy agents durvalumab      To help prevent nausea and vomiting after your treatment, we encourage you to take your nausea medication as directed.  BELOW ARE SYMPTOMS THAT SHOULD BE REPORTED IMMEDIATELY: *FEVER GREATER THAN 100.4 F (38 C) OR HIGHER *CHILLS OR SWEATING *NAUSEA AND VOMITING THAT IS NOT CONTROLLED WITH YOUR NAUSEA MEDICATION *UNUSUAL SHORTNESS OF BREATH *UNUSUAL BRUISING OR BLEEDING *URINARY PROBLEMS (pain or burning when urinating, or frequent urination) *BOWEL PROBLEMS (unusual diarrhea, constipation, pain near the anus) TENDERNESS IN MOUTH AND THROAT WITH OR WITHOUT PRESENCE OF ULCERS (sore throat, sores in mouth, or a toothache) UNUSUAL RASH, SWELLING OR PAIN  UNUSUAL VAGINAL DISCHARGE OR ITCHING   Items with * indicate a potential emergency and should be followed up as soon as possible or go to the Emergency Department if any problems should occur.  Please show the CHEMOTHERAPY ALERT CARD or IMMUNOTHERAPY  ALERT CARD at check-in to the Emergency Department and triage nurse.  Should you have questions after your visit or need to cancel or reschedule your appointment, please contact [facility] - A DEPT OF [facility]  [phone] and follow the prompts.  Office hours are 8:00 a.m. to 4:30 p.m. Monday - Friday. Please note that voicemails left after 4:00 p.m. may not be returned until the following business day.  We are closed weekends and major holidays. You have access to a nurse at all times for urgent questions. Please call the main number to the clinic [phone] and follow the prompts.  For any non-urgent questions, you may also contact your provider using MyChart. We now offer e-Visits for anyone 82 and older to request care online for non-urgent symptoms. For details visit [email or web address].   Also download the MyChart app! Go to the app store, search "MyChart", open the app, select [place], and log in with your MyChart username and password.

## 2023-08-07 NOTE — Progress Notes (Signed)
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  He was found to have a left upper lobe lung mass measuring 5.5 x 3.7 x 2.8 cm.  It was confluent with the left hilar lymph node.  Enlarged AP window lymph node measuring 16 mm.  Enlarged left paratracheal lymph node.  Low-attenuation 7 mm lesion in the liver.  7 mm cyst in the uncinate process of the pancreas for which follow-up was not recommended.  Bilateral kidney cysts.  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  Prostatic enlargement.   PET CT is can showed hypermetabolic left upper lobe lung mass measuring 5.4 x 3.9 cm with an SUV of 15.1.  Ipsilateral mediastinal lymph node metastases with index lymph node measuring 1.7 with an SUV of 9.8.  No evidence of distant metastatic disease.  Biopsy of the left upper lobe as well as station 4R lymph node was consistent with squamous cell carcinoma   NGS testing did not show any actionable mutations.  PD-L1 35%.  MSI stable.  MRI brain was negative for metastatic disease   Patient completed concurrent chemoradiation  with weekly CarboTaxol chemotherapy in December 2023.  Scans following that showed partial response to treatment.  Patient started adjuvant durvalumab in January 2024    Interval history-he is doing well presently and denies any complaints at this time.  ECOG PS- 0 Pain scale- 0   Review of systems- Review of Systems  Constitutional:  Negative for chills, fever, malaise/fatigue and weight loss.  HENT:  Negative for congestion, ear discharge and nosebleeds.   Eyes:  Negative for blurred vision.  Respiratory:  Negative for cough, hemoptysis, sputum production, shortness of breath and wheezing.   Cardiovascular:  Negative for chest pain, palpitations, orthopnea and claudication.  Gastrointestinal:  Negative for abdominal pain, blood in stool, constipation, diarrhea, heartburn, melena, nausea and vomiting.  Genitourinary:  Negative for dysuria, flank pain, frequency, hematuria and urgency.  Musculoskeletal:  Negative for back pain, joint pain and myalgias.  Skin:  Negative for rash.  Neurological:  Negative for dizziness, tingling, focal weakness, seizures, weakness and headaches.  Endo/Heme/Allergies:  Does not bruise/bleed easily.  Psychiatric/Behavioral:  Negative for depression and suicidal ideas. The patient does not have insomnia.       Allergies  Allergen Reactions   Cefdinir Nausea Only    hypersensitivity to smell   Doxycycline     Sun sensitivity   Prednisone     Hallucinations    Sertraline Other (See Comments)    Hallucinations      Past Medical History:  Diagnosis Date   A-fib (HCC)    a.) CHA2DS2-VASc = 6 (age x2, HTN, DVT x2, vascular disease history).  b.) rate/rhythm maintained on oral carvedilol; chronically anticoagulated using daily warfarin.   Actinic keratosis    Anemia    Aortic atherosclerosis (HCC)    Bilateral renal cysts    a.)  CT abdomen pelvis 04/24/2022: Enhancing septation of the lateral aspect of the mid RIGHT kidney measuring 2.7 x 2.8 cm  concerning for cystic renal neoplasm   Bladder calculi    BPH (benign prostatic hyperplasia)    DDD (degenerative disc disease), lumbar    a.) s/p LEFT laminectomy L5   Diverticulosis    DOE (dyspnea on exertion)    DVT (deep venous thrombosis) (HCC)    Elevated PSA    GERD (gastroesophageal reflux disease)    H/O degenerative disc disease    HLD (hyperlipidemia)    Hypertension    Lipoma of arm    Lipomatosis    Long term current use of anticoagulant    a.) warfarin   Lung cancer (HCC)    Mass of upper lobe of left lung 04/24/2022   a.)  CT chest 04/24/2022: Multilobulated perihilar mass measuring 5.3 x 3.7 x 2.8 cm with associated LEFT hilar and mediastinal LAD; b.)  PET CT 05/07/2022: Hypermetabolic LEFT upper lobe lung mass (max SUV 15.1); ipsilateral nodal metastasis --> presuming a NSC histology, 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   NICM (nonischemic cardiomyopathy) (HCC) 11/22/2015   a.) TTE 11/22/2015: EF 40%. b.) TTE 12/27/2018: EF 45%. c.) TTE 08/30/2020: EF 45-50%.   Night sweats    Obesity    Osteoarthritis    RBBB (right bundle branch block)    Right inguinal hernia    VHD (valvular heart disease) 11/22/2015   a.) TTE 11/22/2015: EF 40%; mod MR, mod-sev TR, triv PR; mod BAE, mild RV enlargement. b.) TTE 12/27/2018: EF 45%; mod MR, mod-sev TR, triv PR; mod BAE, mild RV enlargement. c.) TTE 08/30/2020: EF 45-50%; mild MR/TR.     Past Surgical History:  Procedure Laterality Date   Bilateral Radical Keratotomy Bilateral 1997   COLONOSCOPY WITH PROPOFOL N/A 01/11/2016   Procedure: COLONOSCOPY WITH PROPOFOL;  Surgeon: Scot Jun, MD;  Location: [facility] ENDOSCOPY;  Service: Endoscopy;  Laterality: N/A; repeat 3 years, tubular adenoma   DENTAL SURGERY     Patient had implants   FLEXIBLE BRONCHOSCOPY Left 05/19/2022   Procedure: FLEXIBLE BRONCHOSCOPY;  Surgeon: Salena Saner, MD;  Location: [facility] ORS;  Service:  Cardiopulmonary;  Laterality: Left;   GUM SURGERY     IR IMAGING GUIDED PORT INSERTION  06/13/2022   L4 - S1 decompression Left    PROSTATE BIOPSY     TONSILLECTOMY AND ADENOIDECTOMY  age 62   VIDEO BRONCHOSCOPY WITH ENDOBRONCHIAL ULTRASOUND Left 05/19/2022   Procedure: VIDEO BRONCHOSCOPY WITH ENDOBRONCHIAL ULTRASOUND;  Surgeon: Salena Saner, MD;  Location: [facility] ORS;  Service: Cardiopulmonary;  Laterality: Left;   XI ROBOTIC ASSISTED INGUINAL HERNIA REPAIR WITH MESH Right 10/21/2021   Procedure: XI ROBOTIC ASSISTED INGUINAL HERNIA REPAIR WITH MESH;  Surgeon: Campbell Lerner, MD;  Location: [facility] ORS;  Service: General;  Laterality: Right;    Social History   Socioeconomic History   Marital status: Married    Spouse name: Doris   Number of children: 3   Years of education: Not on file   Highest education level: Associate degree: occupational, Scientist, product/process development, or vocational program  Occupational History   Occupation: retired  Tobacco Use   Smoking status: Former    Current packs/day: 0.00  Types: Cigarettes    Quit date: 08/26/1971    Years since quitting: 51.9    Passive exposure: Never   Smokeless tobacco: Never   Tobacco comments:    Smoked for about 20 years.  Vaping Use   Vaping status: Never Used  Substance and Sexual Activity   Alcohol use: Not Currently   Drug use: No   Sexual activity: Not Currently  Other Topics Concern   Not on file  Social History Narrative   Not on file   Social Drivers of Health   Financial Resource Strain: Low Risk  (03/20/2023)   Received from [facility]   Overall Financial Resource Strain (CARDIA)    Difficulty of Paying Living Expenses: Not very hard  Food Insecurity: No Food Insecurity (03/20/2023)   Received from [facility]   Hunger Vital Sign    Worried About Running Out of Food in the Last Year: Never true    Ran Out of Food in the Last Year: Never true  Transportation Needs: No Transportation  Needs (03/20/2023)   Received from [facility] - Transportation    In the past 12 months, has lack of transportation kept you from medical appointments or from getting medications?: No    Lack of Transportation (Non-Medical): No  Physical Activity: Sufficiently Active (12/23/2022)   Exercise Vital Sign    Days of Exercise per Week: 7 days    Minutes of Exercise per Session: 50 min  Stress: No Stress Concern Present (12/18/2021)   [employer] of Occupational Health - Occupational Stress Questionnaire    Feeling of Stress : Only a little  Social Connections: Moderately Integrated (12/23/2022)   Social Connection and Isolation Panel [NHANES]    Frequency of Communication with Friends and Family: Twice a week    Frequency of Social Gatherings with Friends and Family: Once a week    Attends Religious Services: More than 4 times per year    Active Member of [employer] or Organizations: No    Attends Banker Meetings: Never    Marital Status: Married  Catering manager Violence: Not At Risk (12/23/2022)   Humiliation, Afraid, Rape, and Kick questionnaire    Fear of Current or Ex-Partner: No    Emotionally Abused: No    Physically Abused: No    Sexually Abused: No    Family History  Problem Relation Age of Onset   Cancer Mother        secondary to cancinoma of the jaw from her dipping snuff.   Heart attack Father    CAD Father    Hypertension Sister    Diabetes Son        Type I diabetes   Prostate cancer Neg Hx    Kidney cancer Neg Hx      Current Outpatient Medications:    amLODipine (NORVASC) 5 MG tablet, TAKE 1 TABLET (5 MG TOTAL) BY MOUTH DAILY., Disp: 90 tablet, Rfl: 3   benazepril (LOTENSIN) 40 MG tablet, Take 1 tablet (40 mg total) by mouth daily., Disp: 90 tablet, Rfl: 1   betamethasone dipropionate 0.05 % cream, Apply to aa BID PRN. Use no more than 4 weeks., Disp: 45 g, Rfl: 0   carvedilol (COREG) 6.25 MG tablet, TAKE 1 TABLET (6.25  MG TOTAL) BY MOUTH 2 (TWO) TIMES DAILY., Disp: 180 tablet, Rfl: 3   Cyanocobalamin (VITAMIN B 12 PO), Take 1 capsule by mouth 2 (two) times daily. 1000 mcg each  capsule, Disp: , Rfl:    doxazosin (CARDURA) 8 MG tablet, TAKE 1 TABLET EVERY DAY, Disp: 90 tablet, Rfl: 1   finasteride (PROSCAR) 5 MG tablet, TAKE 1 TABLET EVERY DAY, Disp: 90 tablet, Rfl: 3   gabapentin (NEURONTIN) 300 MG capsule, TAKE 1 CAPSULE EVERY MORNING, 1 CAPSULE IN THE AFTERNOON AND 2 CAPSULES AT BEDTIME, Disp: 360 capsule, Rfl: 3   Magnesium 500 MG TABS, Take 500 mg by mouth daily., Disp: , Rfl:    Multiple Vitamin (MULTI-VITAMIN) tablet, Take 1 tablet by mouth daily. , Disp: , Rfl:    omeprazole (PRILOSEC) 20 MG capsule, Take 20 mg by mouth daily., Disp: , Rfl:    oxyCODONE (OXY IR/ROXICODONE) 5 MG immediate release tablet, Take 1 tablet (5 mg total) by mouth every 6 (six) hours as needed for severe pain., Disp: 90 tablet, Rfl: 0   tacrolimus (PROTOPIC) 0.1 % ointment, Apply topically 2 (two) times daily. Apply as directed. Refer to patient print out, Disp: 60 g, Rfl: 2   triamcinolone cream (KENALOG) 0.1 %, Apply topically to affected area of right lower leg twice daily for 4 weeks. Avoid applying to face, groin, and axilla. Use as directed., Disp: 45 g, Rfl: 0   triamterene-hydrochlorothiazide (MAXZIDE-25) 37.5-25 MG tablet, TAKE 1/2 TABLET EVERY DAY, Disp: 45 tablet, Rfl: 1   warfarin (COUMADIN) 5 MG tablet, TAKE 1 TABLET EVERY DAY, Disp: 90 tablet, Rfl: 3 No current facility-administered medications for this visit.  Facility-Administered Medications Ordered in Other Visits:    heparin lock flush 100 UNIT/ML injection, , , ,   Physical exam:  Vitals:   08/07/23 1015  BP: 129/70  Pulse: 63  Resp: 20  Temp: 98.9 F (37.2 C)  SpO2: 100%  Weight: 154 lb 4.8 oz (70 kg)   Physical Exam Cardiovascular:     Rate and Rhythm: Normal rate and regular rhythm.     Heart sounds: Normal heart sounds.  Pulmonary:     Effort:  Pulmonary effort is normal.     Breath sounds: Normal breath sounds.  Skin:    General: Skin is warm and dry.  Neurological:     Mental Status: He is alert and oriented to person, place, and time.         Latest Ref Rng & Units 08/07/2023    9:54 AM  CMP  Glucose 70 - 99 mg/dL 99   BUN 8 - 23 mg/dL 25   Creatinine 1.61 - 1.24 mg/dL 0.96   Sodium 045 - 409 mmol/L 140   Potassium 3.5 - 5.1 mmol/L 4.3   Chloride 98 - 111 mmol/L 110   CO2 22 - 32 mmol/L 25   Calcium 8.9 - 10.3 mg/dL 8.6   Total Protein 6.5 - 8.1 g/dL 5.9   Total Bilirubin <8.1 mg/dL 0.5   Alkaline Phos 38 - 126 U/L 55   AST 15 - 41 U/L 17   ALT 0 - 44 U/L 14       Latest Ref Rng & Units 08/07/2023    9:54 AM  CBC  WBC 4.0 - 10.5 K/uL 4.4   Hemoglobin 13.0 - 17.0 g/dL 19.1   Hematocrit 47.8 - 52.0 % 31.4   Platelets 150 - 400 K/uL 153     No images are attached to the encounter.  CT CHEST ABDOMEN PELVIS W CONTRAST Result Date: 07/26/2023 CLINICAL DATA:  Left upper lobe lung cancer restaging * Tracking Code: BO * EXAM: CT CHEST, ABDOMEN, AND PELVIS WITH CONTRAST  TECHNIQUE: Multidetector CT imaging of the chest, abdomen and pelvis was performed following the standard protocol during bolus administration of intravenous contrast. RADIATION DOSE REDUCTION: This exam was performed according to the departmental dose-optimization program which includes automated exposure control, adjustment of the mA and/or kV according to patient size and/or use of iterative reconstruction technique. CONTRAST:  OMNIPAQUE IOHEXOL 300 MG/ML  SOLN COMPARISON:  03/30/2023 FINDINGS: CT CHEST FINDINGS Cardiovascular: Right chest port catheter. Aortic atherosclerosis. Cardiomegaly. Three-vessel coronary artery calcifications. No pericardial effusion. Mediastinum/Nodes: No enlarged mediastinal, hilar, or axillary lymph nodes. Thyroid gland, trachea, and esophagus demonstrate no significant findings. Lungs/Pleura: Unchanged post treatment  appearance of the perihilar and suprahilar left lung, with treated mass and consolidation of the medial left upper lobe (series 4, image 66). Previously seen left pleural effusion is resolved. Mild underlying centrilobular emphysema. Musculoskeletal: No chest wall abnormality. No acute osseous findings. CT ABDOMEN PELVIS FINDINGS Hepatobiliary: Unchanged small hyperenhancing lesions of the inferior right lobe of the liver, hepatic segment VI (series 2, image 61, 83). No gallstones, gallbladder wall thickening, or biliary dilatation. Pancreas: Unremarkable. No pancreatic ductal dilatation or surrounding inflammatory changes. Spleen: Normal in size without significant abnormality. Adrenals/Urinary Tract: Adrenal glands are unremarkable. Slight interval enlargement of a thickly septated cyst in the lateral midportion of the right kidney measuring 3.3 x 2.9 cm, previously 3.1 x 2.6 cm (series 2, image 69). Multiple additional simple fluid signal renal cortical cysts bilaterally, for which no specific further follow-up or characterization is required. No calculi or hydronephrosis. Bladder is unremarkable. Stomach/Bowel: Stomach is within normal limits. Appendix appears normal. No evidence of bowel wall thickening, distention, or inflammatory changes. Vascular/Lymphatic: Aortic atherosclerosis. No enlarged abdominal or pelvic lymph nodes. Reproductive: Prostatomegaly. Other: No abdominal wall hernia or abnormality. No ascites. Musculoskeletal: No acute osseous findings. IMPRESSION: 1. Unchanged post treatment appearance of the perihilar and suprahilar left lung, with treated mass and consolidation of the medial left upper lobe. 2. Previously seen left pleural effusion is resolved. 3. No evidence of lymphadenopathy or metastatic disease in the chest, abdomen, or pelvis. 4. Unchanged small hyperenhancing lesions of the inferior right lobe of the liver, hepatic segment VI, most likely small flash filling hemangiomas or  perhaps focal nodular hyperplasias. Continued attention on follow-up. 5. Slight interval enlargement of a thickly septated cyst in the lateral midportion of the right kidney measuring 3.3 x 2.9 cm, previously 3.1 x 2.6 cm. 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 6. Emphysema. 7. Prostatomegaly. 8. Coronary artery disease. Aortic Atherosclerosis (ICD10-I70.0) and Emphysema (ICD10-J43.9). Electronically Signed   By: Jearld Lesch M.D.   On: 07/26/2023 14:54     Assessment and plan- Patient is a 88 y.o. male with history of stage III squamous cell carcinoma of the left upper lobe T3 N2 M0 s/p concurrent weekly CarboTaxol with radiation. He had partial response to treatment.  He is here for on treatment assessment prior to cycle 13 of adjuvant durvalumab  Counts okay to proceed with cycle 13 of adjuvant durvalumab today.  He does not wish to get 14 cycle and therefore we are ending his adjuvant treatment today.  CT chest abdomenAnd pelvis images were reviewed independently and discussed findings with the patient which does not show any evidence of recurrent or progressive disease.  Stable post fibrotic changes from radiation noted in the left upper lobe.  He has right renal cyst which is growing slowly and is concerning for possible  cystic renal cell carcinoma.  I will refer him back to urology for this.  Plan is to get CT chest chest abdomen pelvis with contrast again in about 3 months time and I will see him thereafter.  He will continue to get port flushes every 8 weeks   Visit Diagnosis 1. Primary malignant neoplasm of left upper lobe of lung (HCC)   2. Encounter for antineoplastic immunotherapy   3. Renal cyst      Dr. Owens Shark, MD, MPH [facility] [phone] 08/07/2023 2:01 PM

## 2023-08-09 LAB — T4: T4, Total: 7 ug/dL (ref 4.5–12.0)

## 2023-08-10 ENCOUNTER — Ambulatory Visit
Admission: RE | Admit: 2023-08-10 | Discharge: 2023-08-10 | Disposition: A | Discharge status: Home / Self Care | Payer: Medicare HMO | Source: Ambulatory Visit | Attending: Radiation Oncology | Admitting: Radiation Oncology

## 2023-08-10 ENCOUNTER — Encounter: Payer: Self-pay | Admitting: Radiation Oncology

## 2023-08-10 VITALS — BP 134/77 | HR 55 | Temp 98.5°F | Resp 16 | Ht 69.0 in | Wt 154.0 lb

## 2023-08-10 DIAGNOSIS — Z923 Personal history of irradiation: Secondary | ICD-10-CM | POA: Insufficient documentation

## 2023-08-10 DIAGNOSIS — R918 Other nonspecific abnormal finding of lung field: Secondary | ICD-10-CM

## 2023-08-10 DIAGNOSIS — C3412 Malignant neoplasm of upper lobe, left bronchus or lung: Secondary | ICD-10-CM | POA: Insufficient documentation

## 2023-08-10 DIAGNOSIS — Z9221 Personal history of antineoplastic chemotherapy: Secondary | ICD-10-CM | POA: Diagnosis not present

## 2023-08-10 DIAGNOSIS — R1310 Dysphagia, unspecified: Secondary | ICD-10-CM | POA: Insufficient documentation

## 2023-08-10 DIAGNOSIS — Z87891 Personal history of nicotine dependence: Secondary | ICD-10-CM | POA: Diagnosis not present

## 2023-08-10 NOTE — Progress Notes (Signed)
Radiation Oncology Follow up Note  Name: Casey Gallegos   Date:   08/10/2023 MRN:  [id number] DOB: June 19, 1935    This 88 y.o. male presents to the clinic today for 70-month follow-up status post concurrent chemoradiation therapy for stage IIIb (T3 N2 M0) squamous cell carcinoma the left lung.  REFERRING PROVIDER: Brett Albino*  HPI: Patient is an 88 year old male now out 11 months having completed concurrent chemoradiation therapy for stage IIIb squamous cell carcinoma the left lung.  Seen today in routine follow-up he is doing well.  He still states he has some sort of catch in his throat although he has had swallowing studies which are normal his weight is stable and he has no other dysphagia..  Patient has completed maintenance Durvalumab which he tolerated well.  Recent CT scan shows unchanged posttreatment appearance of the perihilar and suprahilar left lung with mass and consolidation consistent with radiation changes.  Previous left pleural effusion has resolved. COMPLICATIONS OF TREATMENT: none  FOLLOW UP COMPLIANCE: keeps appointments   PHYSICAL EXAM:  BP 134/77   Pulse (!) 55   Temp 98.5 F (36.9 C)   Resp 16   Ht 5\' 9"  (1.753 m)   Wt 154 lb (69.9 kg)   BMI 22.74 kg/m  Well-developed well-nourished patient in NAD. HEENT reveals PERLA, EOMI, discs not visualized.  Oral cavity is clear. No oral mucosal lesions are identified. Neck is clear without evidence of cervical or supraclavicular adenopathy. Lungs are clear to A&P. Cardiac examination is essentially unremarkable with regular rate and rhythm without murmur rub or thrill. Abdomen is benign with no organomegaly or masses noted. Motor sensory and DTR levels are equal and symmetric in the upper and lower extremities. Cranial nerves II through XII are grossly intact. Proprioception is intact. No peripheral adenopathy or edema is identified. No motor or sensory levels are noted. Crude visual fields are within normal  range.  RADIOLOGY RESULTS: CT scans reviewed compatible with above-stated findings  PLAN: Present time patient is doing well with CT evidence of stable disease no evidence of progression in his chest or remainder of his body.  And pleased with his overall progress.  I have asked to see him back in 6 months and then we will start once year follow-up appointments.  Patient continues close follow-up care and his CT scans are ordered through medical oncology.  Patient is to call with any concerns.  I would like to take this opportunity to thank you for allowing me to participate in the care of your patient.Carmina Miller, MD

## 2023-08-17 DIAGNOSIS — I482 Chronic atrial fibrillation, unspecified: Secondary | ICD-10-CM | POA: Diagnosis not present

## 2023-09-10 ENCOUNTER — Ambulatory Visit: Payer: Medicare HMO | Admitting: Urology

## 2023-09-18 ENCOUNTER — Encounter: Payer: Self-pay | Admitting: Oncology

## 2023-09-23 ENCOUNTER — Encounter: Payer: Self-pay | Admitting: Oncology

## 2023-09-25 ENCOUNTER — Ambulatory Visit (INDEPENDENT_AMBULATORY_CARE_PROVIDER_SITE_OTHER): Payer: HMO | Admitting: Urology

## 2023-09-25 ENCOUNTER — Other Ambulatory Visit: Payer: Self-pay

## 2023-09-25 ENCOUNTER — Encounter: Payer: Self-pay | Admitting: Urology

## 2023-09-25 ENCOUNTER — Encounter: Payer: Self-pay | Admitting: Oncology

## 2023-09-25 VITALS — BP 161/83 | HR 60 | Ht 69.0 in | Wt 155.0 lb

## 2023-09-25 DIAGNOSIS — N401 Enlarged prostate with lower urinary tract symptoms: Secondary | ICD-10-CM | POA: Diagnosis not present

## 2023-09-25 DIAGNOSIS — N2889 Other specified disorders of kidney and ureter: Secondary | ICD-10-CM

## 2023-09-25 DIAGNOSIS — N138 Other obstructive and reflux uropathy: Secondary | ICD-10-CM | POA: Diagnosis not present

## 2023-09-25 NOTE — Progress Notes (Signed)
I, Casey Gallegos, acting as a scribe for Casey Altes, MD., have documented all relevant documentation on the behalf of Casey Altes, MD, as directed by Casey Altes, MD while in the presence of Casey Altes, MD.  09/25/2023 10:58 AM   Casey Gallegos May 02, 1935 [id number]  Referring provider: Bosie Clos, MD [address] [place],  [place] [address]  Chief Complaint  Patient presents with   Results   Urologic history: 1. 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  Elected active surveillance  2. BPH with LUTS History of urinary retention TRUS for volume 2023 89cc, Dr. Apolinar Gallegos discussed HoLEP and he elected to defer as his voiding symptoms had significantly improved.  HPI: Casey Gallegos is a 89 y.o. male presents for follow-up  No complaints since his last visit Denies flank, abdominal, or pelvic pain.  No bothersome lower urinary tract symptoms.  He has had a slight interval increase in his cystic renal mass over the last year. CT November 2023, the mass measure 2.9 x 2.6 cm, and his most recent CT had increased to 3.3 x 2.9 cm.    PMH: Past Medical History:  Diagnosis Date   A-fib [facility])    a.) CHA2DS2-VASc = 6 (age x2, HTN, DVT x2, vascular disease history). b.) rate/rhythm maintained on oral carvedilol; chronically anticoagulated using daily warfarin.   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   Bladder calculi    BPH (benign prostatic hyperplasia)    DDD (degenerative disc disease), lumbar    a.) s/p LEFT laminectomy L5   Diverticulosis    DOE (dyspnea on exertion)    DVT (deep venous thrombosis) (HCC)    Elevated PSA    GERD (gastroesophageal reflux disease)    H/O degenerative disc  disease    HLD (hyperlipidemia)    Hypertension    Lipoma of arm    Lipomatosis    Long term current use of anticoagulant    a.) warfarin   Lung cancer (HCC)    Mass of upper lobe of left lung 04/24/2022   a.)  CT chest 04/24/2022: Multilobulated perihilar mass measuring 5.3 x 3.7 x 2.8 cm with associated LEFT hilar and mediastinal LAD; b.)  PET CT 05/07/2022: Hypermetabolic LEFT upper lobe lung mass (max SUV 15.1); ipsilateral nodal metastasis --> presuming a NSC histology, 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   NICM (nonischemic cardiomyopathy) (HCC) 11/22/2015   a.) TTE 11/22/2015: EF 40%. b.) TTE 12/27/2018: EF 45%. c.) TTE 08/30/2020: EF 45-50%.   Night sweats    Obesity    Osteoarthritis    RBBB (right bundle branch block)    Right inguinal hernia    VHD (valvular heart disease) 11/22/2015   a.) TTE 11/22/2015: EF 40%; mod MR, mod-sev TR, triv PR; mod BAE, mild RV enlargement. b.) TTE 12/27/2018: EF 45%; mod MR, mod-sev TR, triv PR; mod BAE, mild RV enlargement. c.) TTE 08/30/2020: EF 45-50%; mild MR/TR.    Surgical History: Past Surgical History:  Procedure Laterality Date   Bilateral Radical Keratotomy Bilateral 1997   COLONOSCOPY WITH PROPOFOL N/A 01/11/2016   Procedure: COLONOSCOPY WITH PROPOFOL;  Surgeon: Casey Jun, MD;  Location: [facility] ENDOSCOPY;  Service: Endoscopy;  Laterality: N/A; repeat 3 years, tubular adenoma   DENTAL SURGERY     Patient had implants   FLEXIBLE BRONCHOSCOPY Left 05/19/2022   Procedure: FLEXIBLE BRONCHOSCOPY;  Surgeon: Casey Saner, MD;  Location: [facility] ORS;  Service: Cardiopulmonary;  Laterality: Left;   GUM SURGERY     IR IMAGING GUIDED PORT INSERTION  06/13/2022   L4 - S1 decompression Left    PROSTATE BIOPSY     TONSILLECTOMY AND ADENOIDECTOMY  age 56   VIDEO BRONCHOSCOPY WITH ENDOBRONCHIAL ULTRASOUND Left 05/19/2022   Procedure: VIDEO BRONCHOSCOPY WITH ENDOBRONCHIAL ULTRASOUND;  Surgeon:  Casey Saner, MD;  Location: [facility] ORS;  Service: Cardiopulmonary;  Laterality: Left;   XI ROBOTIC ASSISTED INGUINAL HERNIA REPAIR WITH MESH Right 10/21/2021   Procedure: XI ROBOTIC ASSISTED INGUINAL HERNIA REPAIR WITH MESH;  Surgeon: Casey Lerner, MD;  Location: [facility] ORS;  Service: General;  Laterality: Right;    Home Medications:  Allergies as of 09/25/2023       Reactions   Cefdinir Nausea Only   hypersensitivity to smell   Doxycycline    Sun sensitivity   Prednisone    Hallucinations    Sertraline Other (See Comments)   Hallucinations         Medication List        Accurate as of September 25, 2023 10:58 AM. If you have any questions, ask your nurse or doctor.          STOP taking these medications    betamethasone dipropionate 0.05 % cream Stopped by: Casey Gallegos   omeprazole 20 MG capsule Commonly known as: PRILOSEC Stopped by: Casey Gallegos   tacrolimus 0.1 % ointment Commonly known as: PROTOPIC Stopped by: Casey Gallegos   triamcinolone cream 0.1 % Commonly known as: KENALOG Stopped by: Casey Gallegos       TAKE these medications    amLODipine 5 MG tablet Commonly known as: NORVASC TAKE 1 TABLET (5 MG TOTAL) BY MOUTH DAILY.   benazepril 40 MG tablet Commonly known as: LOTENSIN Take 1 tablet (40 mg total) by mouth daily.   carvedilol 6.25 MG tablet Commonly known as: COREG TAKE 1 TABLET (6.25 MG TOTAL) BY MOUTH 2 (TWO) TIMES DAILY.   doxazosin 8 MG tablet Commonly known as: CARDURA 2 tablets once daily What changed: Another medication with the same name was removed. Continue taking this medication, and follow the directions you see here. Changed by: Casey Gallegos   finasteride 5 MG tablet Commonly known as: PROSCAR TAKE 1 TABLET EVERY DAY   gabapentin 300 MG capsule Commonly known as: NEURONTIN TAKE 1 CAPSULE EVERY MORNING, 1 CAPSULE IN THE AFTERNOON AND 2 CAPSULES AT BEDTIME   Magnesium 500 MG Tabs Take 500 mg by  mouth daily.   Multi-Vitamin tablet Take 1 tablet by mouth daily.   oxyCODONE 5 MG immediate release tablet Commonly known as: Oxy IR/ROXICODONE Take 1 tablet (5 mg total) by mouth every 6 (six) hours as needed for severe pain.   triamterene-hydrochlorothiazide 37.5-25 MG tablet Commonly known as: MAXZIDE-25 TAKE 1/2 TABLET EVERY DAY   VITAMIN B 12 PO Take 1 capsule by mouth 2 (two) times daily. 1000 mcg each capsule   warfarin 5 MG tablet Commonly known as: COUMADIN Take as directed by the anticoagulation clinic. If you are unsure how to take this medication, talk to your nurse or doctor. Original instructions: TAKE 1 TABLET EVERY  DAY        Allergies:  Allergies  Allergen Reactions   Cefdinir Nausea Only    hypersensitivity to smell   Doxycycline     Sun sensitivity   Prednisone     Hallucinations    Sertraline Other (See Comments)    Hallucinations     Family History: Family History  Problem Relation Age of Onset   Cancer Mother        secondary to cancinoma of the jaw from her dipping snuff.   Heart attack Father    CAD Father    Hypertension Sister    Diabetes Son        Type I diabetes   Prostate cancer Neg Hx    Kidney cancer Neg Hx     Social History:  reports that he quit smoking about 52 years ago. His smoking use included cigarettes. He has never been exposed to tobacco smoke. He has never used smokeless tobacco. He reports that he does not currently use alcohol. He reports that he does not use drugs.   Physical Exam: BP (!) 161/83   Pulse 60   Ht 5\' 9"  (1.753 m)   Wt 155 lb (70.3 kg)   BMI 22.89 kg/m   Constitutional:  Alert and oriented, No acute distress. HEENT: [place] AT Respiratory: Normal respiratory effort, no increased work of breathing. GI: Abdomen is soft, nontender, nondistended, no abdominal masses Psychiatric: Normal mood and affect.   Pertinent Imaging: CT was personally reviewed and interpreted.   CT EXAM: CT CHEST,  ABDOMEN, AND PELVIS WITH CONTRAST   TECHNIQUE: Multidetector CT imaging of the chest, abdomen and pelvis was performed following the standard protocol during bolus administration of intravenous contrast.   RADIATION DOSE REDUCTION: This exam was performed according to the departmental dose-optimization program which includes automated exposure control, adjustment of the mA and/or kV according to patient size and/or use of iterative reconstruction technique.   CONTRAST:  OMNIPAQUE IOHEXOL 300 MG/ML  SOLN   COMPARISON:  03/30/2023   FINDINGS: CT CHEST FINDINGS   Cardiovascular: Right chest port catheter. Aortic atherosclerosis. Cardiomegaly. Three-vessel coronary artery calcifications. No pericardial effusion.   Mediastinum/Nodes: No enlarged mediastinal, hilar, or axillary lymph nodes. Thyroid gland, trachea, and esophagus demonstrate no significant findings.   Lungs/Pleura: Unchanged post treatment appearance of the perihilar and suprahilar left lung, with treated mass and consolidation of the medial left upper lobe (series 4, image 66). Previously seen left pleural effusion is resolved. Mild underlying centrilobular emphysema.   Musculoskeletal: No chest wall abnormality. No acute osseous findings.   CT ABDOMEN PELVIS FINDINGS   Hepatobiliary: Unchanged small hyperenhancing lesions of the inferior right lobe of the liver, hepatic segment VI (series 2, image 61, 83). No gallstones, gallbladder wall thickening, or biliary dilatation.   Pancreas: Unremarkable. No pancreatic ductal dilatation or surrounding inflammatory changes.   Spleen: Normal in size without significant abnormality.   Adrenals/Urinary Tract: Adrenal glands are unremarkable. Slight interval enlargement of a thickly septated cyst in the lateral midportion of the right kidney measuring 3.3 x 2.9 cm, previously 3.1 x 2.6 cm (series 2, image 69). Multiple additional simple fluid signal renal  cortical cysts bilaterally, for which no specific further follow-up or characterization is required. No calculi or hydronephrosis. Bladder is unremarkable.   Stomach/Bowel: Stomach is within normal limits. Appendix appears normal. No evidence of bowel wall thickening, distention, or inflammatory changes.   Vascular/Lymphatic: Aortic atherosclerosis. No enlarged abdominal or pelvic lymph nodes.   Reproductive:  Prostatomegaly.   Other: No abdominal wall hernia or abnormality. No ascites.   Musculoskeletal: No acute osseous findings.   IMPRESSION: 1. Unchanged post treatment appearance of the perihilar and suprahilar left lung, with treated mass and consolidation of the medial left upper lobe. 2. Previously seen left pleural effusion is resolved. 3. No evidence of lymphadenopathy or metastatic disease in the chest, abdomen, or pelvis. 4. Unchanged small hyperenhancing lesions of the inferior right lobe of the liver, hepatic segment VI, most likely small flash filling hemangiomas or perhaps focal nodular hyperplasias. Continued attention on follow-up. 5. Slight interval enlargement of a thickly septated cyst in the lateral midportion of the right kidney measuring 3.3 x 2.9 cm, previously 3.1 x 2.6 cm. 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 6. Emphysema. 7. Prostatomegaly. 8. Coronary artery disease.   Aortic Atherosclerosis (ICD10-I70.0) and Emphysema (ICD10-J43.9).     Electronically Signed   By: Jearld Lesch M.D.   On: 07/26/2023 14:54   Assessment & Plan:    1. Cystic renal mass Bosniak 4 cyst and we discussed there is a high probability for cystic renal cell carcinoma.  Discussed treatment is recommended for masses that increase 5 mm per year or more in size. Over the last year, the mass has increased 4 mm in size. Recommended IR evaluation to see if the mass would be amenable to  percutaneous ablation and he was in agreement with this.   I have reviewed the above documentation for accuracy and completeness, and I agree with the above.   Casey Altes, MD  [facility] [address] [place], [place] [address] [phone]

## 2023-09-26 DIAGNOSIS — N2889 Other specified disorders of kidney and ureter: Secondary | ICD-10-CM

## 2023-09-26 HISTORY — DX: Other specified disorders of kidney and ureter: N28.89

## 2023-09-28 ENCOUNTER — Telehealth: Payer: Self-pay | Admitting: Urology

## 2023-09-28 NOTE — Telephone Encounter (Signed)
His wife called wanting to talk to someone about his appointment that Emonte had on 1/31. He was a little confused when telling her what comes next. She would like a call back explaining the instructions for the next steps.

## 2023-09-28 NOTE — Telephone Encounter (Signed)
Left message on  voice mail for patient to return the call. Also left message that someone will be calling to schedule with IR .

## 2023-10-06 ENCOUNTER — Ambulatory Visit
Admission: RE | Admit: 2023-10-06 | Discharge: 2023-10-06 | Disposition: A | Discharge status: Home / Self Care | Payer: HMO | Source: Ambulatory Visit | Attending: Urology | Admitting: Urology

## 2023-10-06 ENCOUNTER — Encounter: Payer: Self-pay | Admitting: Oncology

## 2023-10-06 DIAGNOSIS — N2889 Other specified disorders of kidney and ureter: Secondary | ICD-10-CM

## 2023-10-06 HISTORY — PX: IR RADIOLOGIST EVAL & MGMT: IMG5224

## 2023-10-06 NOTE — Consult Note (Signed)
Chief Complaint: Patient was seen in consultation today for right renal mass at the request of Stoioff,Scott C  Referring Physician(s): Stoioff,Scott C  History of Present Illness: Casey Gallegos is a 89 y.o. 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  At the time of his initial workup, imaging also demonstrated a complex Bosniak 4 cyst exophytic from the posterolateral aspect of the upper pole of the right kidney.  He underwent concurrent chemoradiation therapy completed in December 2023 followed by durvalumab beginning in January 2024.  On routine surveillance imaging the right renal mass was found to have slightly enlarged.  He was evaluated by Dr. Lonna Cobb of urology.  Naturally, given his advanced age and known lung malignancy he is a suboptimal surgical candidate for total or partial nephrectomy.  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  Casey Gallegos is in his usual state of health.  He reports that he has never had any symptoms from either his lung cancer or this kidney mass.  He denies flank pain, hematuria or other symptoms.  He feels he is doing well.  His only complaint is the anxiety related to knowing that he has this growing lesion on his kidney.  Past Medical History:  Diagnosis Date   A-fib [facility])    a.) CHA2DS2-VASc = 6 (age x2, HTN, DVT x2, vascular disease history). b.) rate/rhythm maintained on oral carvedilol; chronically anticoagulated using daily warfarin.   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   Bladder calculi    BPH (benign prostatic hyperplasia)    DDD (degenerative disc  disease), lumbar    a.) s/p LEFT laminectomy L5   Diverticulosis    DOE (dyspnea on exertion)    DVT (deep venous thrombosis) (HCC)    Elevated PSA    GERD (gastroesophageal reflux disease)    H/O degenerative disc disease    HLD (hyperlipidemia)    Hypertension    Lipoma of arm    Lipomatosis    Long term current use of anticoagulant    a.) warfarin   Lung cancer (HCC)    Mass of upper lobe of left lung 04/24/2022   a.)  CT chest 04/24/2022: Multilobulated perihilar mass measuring 5.3 x 3.7 x 2.8 cm with associated LEFT hilar and mediastinal LAD; b.)  PET CT 05/07/2022: Hypermetabolic LEFT upper lobe lung mass (max SUV 15.1); ipsilateral nodal metastasis --> presuming a NSC histology, 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   NICM (nonischemic cardiomyopathy) (HCC) 11/22/2015   a.) TTE 11/22/2015: EF 40%. b.) TTE 12/27/2018: EF 45%. c.) TTE 08/30/2020: EF 45-50%.   Night sweats    Obesity    Osteoarthritis    RBBB (right bundle branch block)    Right inguinal hernia    VHD (valvular heart disease) 11/22/2015   a.) TTE 11/22/2015: EF 40%; mod MR, mod-sev TR, triv PR; mod BAE, mild RV enlargement. b.) TTE 12/27/2018: EF 45%; mod MR, mod-sev TR, triv PR; mod BAE, mild RV enlargement. c.) TTE 08/30/2020: EF 45-50%; mild MR/TR.    Past Surgical  History:  Procedure Laterality Date   Bilateral Radical Keratotomy Bilateral 1997   COLONOSCOPY WITH PROPOFOL N/A 01/11/2016   Procedure: COLONOSCOPY WITH PROPOFOL;  Surgeon: Scot Jun, MD;  Location: [facility] ENDOSCOPY;  Service: Endoscopy;  Laterality: N/A; repeat 3 years, tubular adenoma   DENTAL SURGERY     Patient had implants   FLEXIBLE BRONCHOSCOPY Left 05/19/2022   Procedure: FLEXIBLE BRONCHOSCOPY;  Surgeon: Salena Saner, MD;  Location: [facility] ORS;  Service: Cardiopulmonary;  Laterality: Left;   GUM SURGERY     IR IMAGING GUIDED PORT INSERTION  06/13/2022   IR RADIOLOGIST EVAL & MGMT   10/06/2023   L4 - S1 decompression Left    PROSTATE BIOPSY     TONSILLECTOMY AND ADENOIDECTOMY  age 42   VIDEO BRONCHOSCOPY WITH ENDOBRONCHIAL ULTRASOUND Left 05/19/2022   Procedure: VIDEO BRONCHOSCOPY WITH ENDOBRONCHIAL ULTRASOUND;  Surgeon: Salena Saner, MD;  Location: [facility] ORS;  Service: Cardiopulmonary;  Laterality: Left;   XI ROBOTIC ASSISTED INGUINAL HERNIA REPAIR WITH MESH Right 10/21/2021   Procedure: XI ROBOTIC ASSISTED INGUINAL HERNIA REPAIR WITH MESH;  Surgeon: Campbell Lerner, MD;  Location: [facility] ORS;  Service: General;  Laterality: Right;    Allergies: Cefdinir, Doxycycline, Prednisone, and Sertraline  Medications: Prior to Admission medications   Medication Sig Start Date End Date Taking? Authorizing Provider  amLODipine (NORVASC) 5 MG tablet TAKE 1 TABLET (5 MG TOTAL) BY MOUTH DAILY. 09/19/22   Simmons-Robinson, Makiera, MD  benazepril (LOTENSIN) 40 MG tablet Take 1 tablet (40 mg total) by mouth daily. 12/22/22   Simmons-Robinson, Makiera, MD  carvedilol (COREG) 6.25 MG tablet TAKE 1 TABLET (6.25 MG TOTAL) BY MOUTH 2 (TWO) TIMES DAILY. 09/19/22   Simmons-Robinson, Makiera, MD  Cyanocobalamin (VITAMIN B 12 PO) Take 1 capsule by mouth 2 (two) times daily. 1000 mcg each capsule    [provider]  doxazosin (CARDURA) 8 MG tablet 2 tablets once daily    [provider]  finasteride (PROSCAR) 5 MG tablet TAKE 1 TABLET EVERY DAY 02/12/23   McGowan, Carollee Herter A, PA-C  gabapentin (NEURONTIN) 300 MG capsule TAKE 1 CAPSULE EVERY MORNING, 1 CAPSULE IN THE AFTERNOON AND 2 CAPSULES AT BEDTIME 04/08/23   Simmons-Robinson, Makiera, MD  Magnesium 500 MG TABS Take 500 mg by mouth daily.    [provider]  Multiple Vitamin (MULTI-VITAMIN) tablet Take 1 tablet by mouth daily.     [provider]  oxyCODONE (OXY IR/ROXICODONE) 5 MG immediate release tablet Take 1 tablet (5 mg total) by mouth every 6 (six) hours as needed for severe pain. 12/03/22    Simmons-Robinson, Tawanna Cooler, MD  triamterene-hydrochlorothiazide (MAXZIDE-25) 37.5-25 MG tablet TAKE 1/2 TABLET EVERY DAY 03/05/23   Simmons-Robinson, Tawanna Cooler, MD  warfarin (COUMADIN) 5 MG tablet TAKE 1 TABLET EVERY DAY 03/30/23   Simmons-Robinson, Makiera, MD     Family History  Problem Relation Age of Onset   Cancer Mother        secondary to cancinoma of the jaw from her dipping snuff.   Heart attack Father    CAD Father    Hypertension Sister    Diabetes Son        Type I diabetes   Prostate cancer Neg Hx    Kidney cancer Neg Hx     Social History   Socioeconomic History   Marital status: Married    Spouse name: Doris   Number of children: 3   Years of education: Not on file   Highest education level: Associate degree:  occupational, Scientist, product/process development, or vocational program  Occupational History   Occupation: retired  Tobacco Use   Smoking status: Former    Current packs/day: 0.00    Types: Cigarettes    Quit date: 08/26/1971    Years since quitting: 52.1    Passive exposure: Never   Smokeless tobacco: Never   Tobacco comments:    Smoked for about 20 years.  Vaping Use   Vaping status: Never Used  Substance and Sexual Activity   Alcohol use: Not Currently   Drug use: No   Sexual activity: Not Currently  Other Topics Concern   Not on file  Social History Narrative   Not on file   Social Drivers of Health   Financial Resource Strain: Low Risk  (03/20/2023)   Received from [facility]   Overall Financial Resource Strain (CARDIA)    Difficulty of Paying Living Expenses: Not very hard  Food Insecurity: No Food Insecurity (03/20/2023)   Received from [facility]   Hunger Vital Sign    Worried About Running Out of Food in the Last Year: Never true    Ran Out of Food in the Last Year: Never true  Transportation Needs: No Transportation Needs (03/20/2023)   [facility]    In the past  12 months, has lack of transportation kept you from medical appointments or from getting medications?: No    Lack of Transportation (Non-Medical): No  Physical Activity: Sufficiently Active (12/23/2022)   Exercise Vital Sign    Days of Exercise per Week: 7 days    Minutes of Exercise per Session: 50 min  Stress: No Stress Concern Present (12/18/2021)   [employer] of Occupational Health - Occupational Stress Questionnaire    Feeling of Stress : Only a little  Social Connections: Moderately Integrated (12/23/2022)   Social Connection and Isolation Panel [NHANES]    Frequency of Communication with Friends and Family: Twice a week    Frequency of Social Gatherings with Friends and Family: Once a week    Attends Religious Services: More than 4 times per year    Active Member of [employer] or Organizations: No    Attends Banker Meetings: Never    Marital Status: Married    ECOG Status: 0 - Asymptomatic  Review of Systems: A 12 point ROS discussed and pertinent positives are indicated in the HPI above.  All other systems are negative.  Review of Systems  Vital Signs: BP (!) 193/94   Pulse (!) 57   SpO2 97%    Physical Exam Constitutional:      General: He is not in acute distress.    Appearance: Normal appearance. He is normal weight.  HENT:     Head: Normocephalic and atraumatic.  Eyes:     General: No scleral icterus. Cardiovascular:     Rate and Rhythm: Normal rate.  Pulmonary:     Effort: Pulmonary effort is normal.  Abdominal:     General: Abdomen is flat. There is no distension.     Palpations: Abdomen is soft.     Tenderness: There is no abdominal tenderness.  Skin:    General: Skin is warm and dry.  Neurological:     Mental Status: He is alert and oriented to person, place, and time.  Psychiatric:        Mood and Affect: Mood normal.        Behavior: Behavior normal.  Imaging: IR Radiologist Eval & Mgmt Result Date: 10/06/2023 EXAM: NEW  PATIENT OFFICE VISIT CHIEF COMPLAINT: SEE NOTE IN EPIC HISTORY OF PRESENT ILLNESS: SEE NOTE IN EPIC REVIEW OF SYSTEMS: SEE NOTE IN EPIC PHYSICAL EXAMINATION: SEE NOTE IN EPIC ASSESSMENT AND PLAN: SEE NOTE IN EPIC Electronically Signed   By: Malachy Moan M.D.   On: 10/06/2023 13:51    Labs:  CBC: Recent Labs    05/15/23 1007 06/12/23 0907 07/10/23 0844 08/07/23 0954  WBC 3.8* 4.1 3.6* 4.4  HGB 10.2* 10.8* 10.3* 10.5*  HCT 30.3* 32.0* 31.1* 31.4*  PLT 101* 121* 109* 153    COAGS: Recent Labs    02/23/23 0922 03/11/23 1425  INR 1.9*  1.9 2.3  2.3    BMP: Recent Labs    05/15/23 1007 06/12/23 0907 07/10/23 0844 08/07/23 0954  NA 137 138 141 140  K 3.9 3.9 3.7 4.3  CL 107 109 111 110  CO2 25 24 24 25   GLUCOSE 101* 115* 132* 99  BUN 30* 19 25* 25*  CALCIUM 8.3* 8.5* 8.6* 8.6*  CREATININE 1.05 0.95 1.19 0.99  GFRNONAA >60 >60 59* >60    LIVER FUNCTION TESTS: Recent Labs    05/15/23 1007 06/12/23 0907 07/10/23 0844 08/07/23 0954  BILITOT 1.2 1.0 0.9 0.5  AST 18 22 20 17   ALT 16 17 16 14   ALKPHOS 51 58 58 55  PROT 5.7* 6.0* 6.0* 5.9*  ALBUMIN 3.3* 3.6 3.5 3.4*    TUMOR MARKERS: No results for input(s): "AFPTM", "CEA", "CA199", "CHROMGRNA" in the last 8760 hours.  Assessment and Plan:  Extremely pleasant 89 year old gentleman with an enlarging enhancing cystic mass arising from the posterior lateral aspect of the upper pole of the right kidney.  The lesion was first diagnosed by imaging in August 2023 and has been managed expectantly with surveillance imaging.  More recent imaging from 07/21/2023 demonstrates slight interval enlargement to a size of 3.3 x 2.9 cm.  We discussed the natural history of enlarging complex enhancing cystic masses.  He understands that there is a greater than 70% probability this is a cystic renal cell carcinoma.  Given his advanced age and history of prior lung cancer, he is not an optimal surgical candidate.  However, he  is an excellent candidate for percutaneous thermal ablation.  In particular, I believe percutaneous microwave ablation would be ideal for this lesion type and location.  I explained the risks, benefits and alternatives to the procedure in detail.  He understands and is eager to proceed.  I was able to answer all of his and his wife's questions fully.  1.)  Please schedule for CT-guided percutaneous microwave ablation and concurrent biopsy of the right renal lesion to be performed under general anesthesia at [facility].   Thank you for this interesting consult.  I greatly enjoyed meeting MIKAL BLASDELL and look forward to participating in their care.  A copy of this report was sent to the requesting provider on this date.  Electronically Signed: Sterling Big 10/06/2023, 2:19 PM   I spent a total of 40 Minutes  in face to face in clinical consultation, greater than 50% of which was counseling/coordinating care for right complex cystic renal mass.

## 2023-10-09 ENCOUNTER — Other Ambulatory Visit: Payer: Self-pay | Admitting: Interventional Radiology

## 2023-10-09 ENCOUNTER — Inpatient Hospital Stay: Payer: HMO | Attending: Oncology

## 2023-10-09 ENCOUNTER — Encounter: Payer: Self-pay | Admitting: Oncology

## 2023-10-09 DIAGNOSIS — C3412 Malignant neoplasm of upper lobe, left bronchus or lung: Secondary | ICD-10-CM | POA: Insufficient documentation

## 2023-10-09 DIAGNOSIS — Z452 Encounter for adjustment and management of vascular access device: Secondary | ICD-10-CM | POA: Insufficient documentation

## 2023-10-09 DIAGNOSIS — N2889 Other specified disorders of kidney and ureter: Secondary | ICD-10-CM

## 2023-10-09 MED ORDER — SODIUM CHLORIDE 0.9% FLUSH
10.0000 mL | Freq: Once | INTRAVENOUS | Status: AC
  Administered 2023-10-09: 10 mL via INTRAVENOUS
  Filled 2023-10-09: qty 10

## 2023-10-09 MED ORDER — HEPARIN SOD (PORK) LOCK FLUSH 100 UNIT/ML IV SOLN
500.0000 [IU] | Freq: Once | INTRAVENOUS | Status: AC
  Administered 2023-10-09: 500 [IU] via INTRAVENOUS
  Filled 2023-10-09: qty 5

## 2023-10-12 ENCOUNTER — Encounter: Payer: Self-pay | Admitting: Oncology

## 2023-10-12 ENCOUNTER — Telehealth: Payer: Self-pay | Admitting: Urology

## 2023-10-12 NOTE — Telephone Encounter (Signed)
 Patient came in on 2/11 with a bill. His insurance was not ran for his visit on 1/31. Crystal put in correct insurance and added it to the visit date. I called patient and advised him it could take up to 45 days to be corrected and if he gets a bill before the 45 days then to throw those away. He should contact us after 45 days if bill is still not correct.

## 2023-10-20 ENCOUNTER — Other Ambulatory Visit: Payer: Self-pay

## 2023-10-20 ENCOUNTER — Encounter
Admission: RE | Admit: 2023-10-20 | Discharge: 2023-10-20 | Disposition: A | Discharge status: Home / Self Care | Payer: HMO | Source: Ambulatory Visit | Attending: Interventional Radiology | Admitting: Interventional Radiology

## 2023-10-20 VITALS — Ht 69.0 in | Wt 152.0 lb

## 2023-10-20 DIAGNOSIS — I482 Chronic atrial fibrillation, unspecified: Secondary | ICD-10-CM

## 2023-10-20 DIAGNOSIS — Z01810 Encounter for preprocedural cardiovascular examination: Secondary | ICD-10-CM

## 2023-10-20 DIAGNOSIS — Z01812 Encounter for preprocedural laboratory examination: Secondary | ICD-10-CM

## 2023-10-20 DIAGNOSIS — Z7901 Long term (current) use of anticoagulants: Secondary | ICD-10-CM

## 2023-10-20 DIAGNOSIS — I10 Essential (primary) hypertension: Secondary | ICD-10-CM

## 2023-10-20 HISTORY — DX: Cardiomyopathy, unspecified: I42.9

## 2023-10-20 HISTORY — DX: Venous insufficiency (chronic) (peripheral): I87.2

## 2023-10-20 HISTORY — DX: Essential (primary) hypertension: I10

## 2023-10-20 NOTE — Patient Instructions (Addendum)
 Your procedure is scheduled on: Monday, March 10 Report to the Registration Desk on the 1st floor of the [facility] at 7:30 am. Bonita Quin will be directed to the specials department for your procedure.  REMEMBER: Instructions that are not followed completely may result in serious medical risk, up to and including death; or upon the discretion of your surgeon and anesthesiologist your surgery may need to be rescheduled.  Do not eat food after midnight the night before surgery.  No gum chewing or hard candies.  You may however, drink CLEAR liquids up to 2 hours before you are scheduled to arrive for your surgery. Do not drink anything within 2 hours of your scheduled arrival time.  Clear liquids include: - water  - apple juice without pulp - gatorade (not RED colors) - black coffee or tea (Do NOT add milk or creamers to the coffee or tea) Do NOT drink anything that is not on this list.  One week prior to surgery: starting March 3 Stop Anti-inflammatories (NSAIDS) such as Advil, Aleve, Ibuprofen, Motrin, Naproxen, Naprosyn and Aspirin based products such as Excedrin, Goody's Powder, BC Powder. Stop ANY OVER THE COUNTER supplements until after surgery. Stop vitamin B, magnesium, multiple vitamins.  You may however, continue to take Tylenol if needed for pain up until the day of surgery.  Warfarin (Coumadin) - hold for 5 days before surgery. Last day to take is Tuesday, March 4. Resume AFTER surgery per surgeon instruction.  Continue taking all of your other prescription medications up until the day of surgery.  ON THE DAY OF SURGERY ONLY TAKE THESE MEDICATIONS WITH SIPS OF WATER:  Amlodipine Carvedilol Finasteride Gabapentin  No Alcohol for 24 hours before or after surgery.  No Smoking including e-cigarettes for 24 hours before surgery.  No chewable tobacco products for at least 6 hours before surgery.  No nicotine patches on the day of surgery.  Do not use any "recreational" drugs  for at least a week (preferably 2 weeks) before your surgery.  Please be advised that the combination of cocaine and anesthesia may have negative outcomes, up to and including death. If you test positive for cocaine, your surgery will be cancelled.  On the morning of surgery brush your teeth with toothpaste and water, you may rinse your mouth with mouthwash if you wish. Do not swallow any toothpaste or mouthwash.  Use CHG Soap as directed on instruction sheet.  Do not wear jewelry, make-up, hairpins, clips or nail polish.  For welded (permanent) jewelry: bracelets, anklets, waist bands, etc.  Please have this removed prior to surgery.  If it is not removed, there is a chance that hospital personnel will need to cut it off on the day of surgery.  Do not wear lotions, powders, or perfumes.   Do not shave body hair from the neck down 48 hours before surgery.  Contact lenses, hearing aids and dentures may not be worn into surgery.  Do not bring valuables to the hospital. [facility] is not responsible for any missing/lost belongings or valuables.   Notify your doctor if there is any change in your medical condition (cold, fever, infection).  Wear comfortable clothing (specific to your surgery type) to the hospital.  After surgery, you can help prevent lung complications by doing breathing exercises.  Take deep breaths and cough every 1-2 hours.   If you are being discharged the day of surgery, you will not be allowed to drive home. You will need a responsible individual to  drive you home and stay with you for 24 hours after surgery.   If you are taking public transportation, you will need to have a responsible individual with you.  Please call the Pre-admissions Testing Dept. at [phone] if you have any questions about these instructions.  Surgery Visitation Policy:  Patients having surgery or a procedure may have two visitors.  Children under the age of 66 must have an adult  with them who is not the patient.  Temporary Visitor Restrictions Due to increasing cases of flu, RSV and COVID-19: Children ages 86 and under will not be able to visit patients in [facility] hospitals under most circumstances.      Preparing for Surgery with CHLORHEXIDINE GLUCONATE (CHG) Soap  Chlorhexidine Gluconate (CHG) Soap  o An antiseptic cleaner that kills germs and bonds with the skin to continue killing germs even after washing  o Used for showering the night before surgery and morning of surgery  Before surgery, you can play an important role by reducing the number of germs on your skin.  CHG (Chlorhexidine gluconate) soap is an antiseptic cleanser which kills germs and bonds with the skin to continue killing germs even after washing.  Please do not use if you have an allergy to CHG or antibacterial soaps. If your skin becomes reddened/irritated stop using the CHG.  1. Shower the NIGHT BEFORE SURGERY and the MORNING OF SURGERY with CHG soap.  2. If you choose to wash your hair, wash your hair first as usual with your normal shampoo.  3. After shampooing, rinse your hair and body thoroughly to remove the shampoo.  4. Use CHG as you would any other liquid soap. You can apply CHG directly to the skin and wash gently with a scrungie or a clean washcloth.  5. Apply the CHG soap to your body only from the neck down. Do not use on open wounds or open sores. Avoid contact with your eyes, ears, mouth, and genitals (private parts). Wash face and genitals (private parts) with your normal soap.  6. Wash thoroughly, paying special attention to the area where your surgery will be performed.  7. Thoroughly rinse your body with warm water.  8. Do not shower/wash with your normal soap after using and rinsing off the CHG soap.  9. Pat yourself dry with a clean towel.  10. Wear clean pajamas to bed the night before surgery.  12. Place clean sheets on your bed the night of your  first shower and do not sleep with pets.  13. Shower again with the CHG soap on the day of surgery prior to arriving at the hospital.  14. Do not apply any deodorants/lotions/powders.  15. Please wear clean clothes to the hospital.

## 2023-10-21 ENCOUNTER — Encounter
Admission: RE | Admit: 2023-10-21 | Discharge: 2023-10-21 | Disposition: A | Discharge status: Home / Self Care | Payer: HMO | Source: Ambulatory Visit | Attending: Interventional Radiology | Admitting: Interventional Radiology

## 2023-10-21 DIAGNOSIS — Z01812 Encounter for preprocedural laboratory examination: Secondary | ICD-10-CM

## 2023-10-21 DIAGNOSIS — Z01818 Encounter for other preprocedural examination: Secondary | ICD-10-CM | POA: Insufficient documentation

## 2023-10-21 DIAGNOSIS — I10 Essential (primary) hypertension: Secondary | ICD-10-CM | POA: Diagnosis not present

## 2023-10-21 DIAGNOSIS — Z01810 Encounter for preprocedural cardiovascular examination: Secondary | ICD-10-CM | POA: Diagnosis not present

## 2023-10-21 DIAGNOSIS — I482 Chronic atrial fibrillation, unspecified: Secondary | ICD-10-CM | POA: Diagnosis not present

## 2023-10-21 LAB — BASIC METABOLIC PANEL
Anion gap: 5 (ref 5–15)
BUN: 33 mg/dL — ABNORMAL HIGH (ref 8–23)
CO2: 27 mmol/L (ref 22–32)
Calcium: 8.6 mg/dL — ABNORMAL LOW (ref 8.9–10.3)
Chloride: 108 mmol/L (ref 98–111)
Creatinine, Ser: 1.16 mg/dL (ref 0.61–1.24)
GFR, Estimated: >60 mL/min (ref >60)
Glucose, Bld: 83 mg/dL (ref 70–99)
Potassium: 4.1 mmol/L (ref 3.5–5.1)
Sodium: 140 mmol/L (ref 135–145)

## 2023-10-23 ENCOUNTER — Ambulatory Visit (INDEPENDENT_AMBULATORY_CARE_PROVIDER_SITE_OTHER): Payer: Medicare HMO | Admitting: Vascular Surgery

## 2023-10-30 ENCOUNTER — Other Ambulatory Visit (HOSPITAL_COMMUNITY): Payer: Self-pay | Admitting: Radiology

## 2023-10-30 DIAGNOSIS — N2889 Other specified disorders of kidney and ureter: Secondary | ICD-10-CM

## 2023-10-30 DIAGNOSIS — I872 Venous insufficiency (chronic) (peripheral): Secondary | ICD-10-CM | POA: Diagnosis not present

## 2023-10-30 DIAGNOSIS — C3492 Malignant neoplasm of unspecified part of left bronchus or lung: Secondary | ICD-10-CM | POA: Diagnosis not present

## 2023-10-30 DIAGNOSIS — I482 Chronic atrial fibrillation, unspecified: Secondary | ICD-10-CM | POA: Diagnosis not present

## 2023-10-30 DIAGNOSIS — I420 Dilated cardiomyopathy: Secondary | ICD-10-CM | POA: Diagnosis not present

## 2023-10-30 DIAGNOSIS — C3412 Malignant neoplasm of upper lobe, left bronchus or lung: Secondary | ICD-10-CM | POA: Diagnosis not present

## 2023-10-30 DIAGNOSIS — N281 Cyst of kidney, acquired: Secondary | ICD-10-CM | POA: Diagnosis not present

## 2023-10-30 NOTE — Progress Notes (Signed)
 Patient for CT guided percutaneous MWA and concurrent biopsy rt renal on Monday 11/02/23, I called and spoke with the patient on the phone and gave pre-procedure instructions. Pt was made aware to be here at 7:30a, last dose of Coumadin was Wed 10/28/23, NPO after MN prior to procedure as well as driver post procedure/recovery/discharge. Pt stated understanding.  Called 10/30/23.  Pre-Admit appt was Tues 2/25 and Wed 2/25.

## 2023-10-30 NOTE — Progress Notes (Signed)
 Perioperative / Anesthesia Services  Pre-Admission Testing Clinical Review / Pre-Operative Anesthesia Consult  Date: 11/01/23  Patient Demographics:  Name: Casey Gallegos DOB: [birth year] MRN:   [id number]  Planned Surgical Procedure(s):    Date/Time: 11/02/23 0830   Procedure: CT RENAL TUMOR ABLATION UNILATERAL   Diagnosis: Renal mass, right [N28.89]   Indications: Microwave Ablation   Location: [facility]      NOTE: Available PAT nursing documentation and vital signs have been reviewed. Clinical nursing staff has updated patient's PMH/PSHx, current medication list, and drug allergies/intolerances to ensure comprehensive history available to assist in medical decision making as it pertains to the aforementioned surgical procedure and anticipated anesthetic course. Extensive review of available clinical information personally performed. Casey Gallegos PMH and PSHx updated with any diagnoses/procedures that  may have been inadvertently omitted during his intake with the pre-admission testing department's nursing staff.  Clinical Discussion:  Casey Gallegos is a 89 y.o. male who is submitted for pre-surgical anesthesia review and clearance prior to him undergoing the above procedure. Patient is a Former Smoker (quit 08/1971). Pertinent PMH includes: atrial fibrillation, NICM, VHD, RBBB, DVT, aortic atherosclerosis, HTN, HLD, DOE, emphysema, NSCLC (SCC) LEFT upper lobe, anemia, GERD (no daily Tx), RIGHT renal mass, bladder calculi, BPH with BOO, OA, thoracic spondylosis, lumbar DDD (s/p laminectomy and L4-S1 decompression), BPH.   Patient is followed by cardiology Melton Alar, MD). He was last seen in the cardiology clinic on 05/14/2023; notes reviewed. At the time of his clinic visit, patient doing well overall from a cardiovascular perspective. Patient denied any chest pain, shortness of breath, PND, orthopnea, palpitations, significant peripheral edema, weakness,  fatigue, vertiginous symptoms, or presyncope/syncope. Patient with a past medical history significant for cardiovascular diagnoses. Documented physical exam was grossly benign, providing no evidence of acute exacerbation and/or decompensation of the patient's known cardiovascular conditions.  Most recent TTE performed on 08/30/2020 revealed a mildly reduced left ventricular systolic function with an EF of 45%. There septal hypokinesis observed. Right ventricular size and function normal.  RVSP = 38.3 mmHg.  There was mild mitral, tricuspid, and pulmonary valve regurgitation. All transvalvular gradients were noted to be normal providing no evidence suggestive of valvular stenosis. Aorta normal in size with no evidence of ectasia or aneurysmal dilatation.  Patient with an atrial fibrillation diagnosis; CHA2DS2-VASc Score = 6 (age x 2, HTN, DVT x 2, vascular disease). His rate and rhythm are currently being maintained on oral carvedilol. He is chronically anticoagulated using warfarin; reported to be compliant with therapy with no evidence or reports of GI/GU bleeding.  Blood pressure well controlled at 130/70 mmHg on currently prescribed CCB (amlodipine), ACEi (benazepril), beta-clocker (carvedilol), alpha-blocker (doxazosin), and diuretic (triamterene/hydrochlorothiazide) therapies. He is not on any type of lipid lowering therapies for his HLD diagnosis and further ASCVD prevention. He is not diabetic. Patient does not have an OSAH diagnosis. Functional capacity limited by age and his multiple medical comorbidities. He is able to complete his ADL/IADLs without significant cardiovascular limitation. Per the DASI, patient questionably able to complete 4 METS of physical activity without experiencing, at least to some degree, significant angina/anginal equivalent symptoms.  No changes were made to his medication regimen during his visit with cardiology.  Patient scheduled to follow-up with outpatient cardiology in 6  months or sooner if needed.  Casey Gallegos underwent CT and abdomen pelvis on 04/24/2022 revealing a 2.7 x 2.8 enhancing septation in the lateral aspect of the RIGHT kidney.  Finding concerning  for cystic renal neoplasm.  Subsequent imaging remained stable until 11/19/2022, at which time there was an interval increase in size to 3.0 x 2.6 with characterization being solid and cystic in the peripheral superior aspect of the RIGHT renal pole.  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  Most recent CT imaging of the noted lesion was performed on 07/21/2023, at which time there was further interval increase in size of the septated cystic lesion to 3.3 x 2.9 cm (previously 3.1 x 2.6 cm).  Patient was referred to interventional radiology for interventional considerations.  Patient subsequently scheduled for a CT-GUIDED RIGHT RENAL TUMOR ABLATION on 11/02/2023 with Dr. Malachy Moan, MD.  Given patient's past medical history significant for cardiovascular diagnoses, preprocedural clearance from patient's primary cardiologist was sought by the PAT team. Per cardiology, "this patient is optimized for surgery and may proceed with the planned procedural course with a MODERATE risk of significant perioperative cardiovascular complications".  Again, this patient is on daily oral anticoagulation therapy using warfarin.  Patient has been instructed on recommendations from his cardiology team for holding his warfarin dose for 5 days prior to his procedure with plans to restart as soon as postoperative bleeding risk to be minimized by his primary attending proceduralist.  Patient is aware that his last dose of warfarin should be on 10/27/2022.  Cardiology also indicated that patient will require enoxaparin bridging surrounding this procedure.  Anticoagulation clinic [facility]) to contact patient regarding specifics regarding enoxaparin bridge.  Patient denies previous perioperative complications with  anesthesia in the past. In review his EMR, it is noted that patient underwent a general anesthetic course here at [facility] ([facility]) in 04/2022 without documented complications.      10/20/2023   10:09 AM 10/06/2023    1:04 PM 09/25/2023    9:43 AM  Vitals with BMI  Height 5\' 9"   5\' 9"   Weight 152 lbs  155 lbs  BMI 22.44  22.88  Systolic  193 161  Diastolic  94 83  Pulse  57 60   Providers/Specialists:  NOTE: Primary physician provider listed below. Patient may have been seen by APP or partner within same practice.   PROVIDER ROLE / SPECIALTY LAST Waldemar Dickens, MD Interventional Radiology (Proceduralist) 10/06/2023  Bosie Clos, MD Primary Care Provider 06/30/2023  Clotilde Dieter, DO Cardiology 05/14/2023  Owens Shark, MD Medical Oncology 08/07/2023  Carmina Miller, MD Radiation Oncology 08/10/2023  Merri Ray, MD Physiatry 11/18/2021   Allergies:   Allergies  Allergen Reactions   Cefdinir Nausea Only    hypersensitivity to smell   Doxycycline     Sun sensitivity   Prednisone     Hallucinations    Sertraline Other (See Comments)    Hallucinations    Current Home Medications:    amLODipine (NORVASC) 5 MG tablet   benazepril (LOTENSIN) 40 MG tablet   carvedilol (COREG) 6.25 MG tablet   Cyanocobalamin (VITAMIN B 12 PO)   doxazosin (CARDURA) 8 MG tablet   finasteride (PROSCAR) 5 MG tablet   gabapentin (NEURONTIN) 300 MG capsule   Magnesium 500 MG TABS   Multiple Vitamin (MULTI-VITAMIN) tablet   oxyCODONE (OXY IR/ROXICODONE) 5 MG immediate release tablet   triamterene-hydrochlorothiazide (MAXZIDE-25) 37.5-25 MG tablet   warfarin (COUMADIN) 5 MG tablet   No current facility-administered medications for this encounter.    heparin lock flush 100 UNIT/ML injection   History:   Past Medical History:  Diagnosis Date  A-fib (HCC)    a.) CHA2DS2-VASc = 6 (age x2, HTN, DVT x2, vascular disease history).  b.) rate/rhythm maintained on oral carvedilol; chronically anticoagulated using daily warfarin.   Actinic keratosis    Adenomatous polyp    Allergic rhinitis    Anemia    Anticoagulated on warfarin    Aortic atherosclerosis (HCC)    Benign essential hypertension    Bilateral renal cysts    Bladder calculi    BPH with urinary obstruction    CAD (coronary artery disease)    Chronic venous insufficiency    DDD (degenerative disc disease), lumbar    a.) s/p LEFT laminectomy and L4-S1 decompression   Diverticulosis    DOE (dyspnea on exertion)    DVT (deep venous thrombosis) (HCC)    Elevated PSA    GERD (gastroesophageal reflux disease)    H/O degenerative disc disease    HLD (hyperlipidemia)    Lipoma of arm    Lipomatosis    Lung cancer (HCC)    NICM (nonischemic cardiomyopathy) (HCC) 11/22/2015   a.) TTE 11/22/2015: EF 40%. b.) TTE 12/27/2018: EF 45%. c.) TTE 08/30/2020: EF 45-50%.   Night sweats    Obesity    Osteoarthritis    Pulmonary emphysema (HCC)    RBBB (right bundle branch block)    Right inguinal hernia    Right renal mass 04/24/2022   a.) CT A/P 04/24/22: 2.7 x 2.8 cm enhancing septation lateral aspect RIGHT kidney concerning for cystic renal neoplasm; b.) CT chest 05/15/22: 3 cm RIGHT renal mass; c.) CT A/P 08/17/22: 2.9 x 2.6 cm exophytic mass; d.) CT A/P 11/19/22: 3.0 x 2.6 cm solid & cystic mass peripheral superior RIGHT renal pole c/w RCC (Bosniak IV); e.) CT A/P 03/30/23: 3.1 x 2.6 cm; f.) CT A/P 07/21/23: 3.3 x 2.9 cm   Squamous cell lung cancer (HCC) 04/24/2022   a.)  CT chest 04/24/2022: Multilobulated perihilar mass measuring 5.3 x 3.7 x 2.8 cm with associated LEFT hilar and mediastinal LAD; b.)  PET CT 05/07/2022: Hypermetabolic LEFT upper lobe lung mass (max SUV 15.1); ipsilateral nodal metastasis --> presuming a NSC histology, consistent with stage IIIb pulmonary neoplasm (T3 N2 M0) --> path (+) NSCLC (SCC)   Thoracic spondylosis    VHD (valvular heart  disease) 11/22/2015   a.) TTE 11/22/2015: EF 40%; mod MR, mod-sev TR, triv PR; mod BAE, mild RV enlargement. b.) TTE 12/27/2018: EF 45%; mod MR, mod-sev TR, triv PR; mod BAE, mild RV enlargement. c.) TTE 08/30/2020: EF 45-50%; mild MR/TR.   Past Surgical History:  Procedure Laterality Date   Bilateral Radical Keratotomy Bilateral 1997   COLONOSCOPY WITH PROPOFOL N/A 01/11/2016   Procedure: COLONOSCOPY WITH PROPOFOL;  Surgeon: Scot Jun, MD;  Location: [facility] ENDOSCOPY;  Service: Endoscopy;  Laterality: N/A; repeat 3 years, tubular adenoma   DENTAL SURGERY     Patient had implants   FLEXIBLE BRONCHOSCOPY Left 05/19/2022   Procedure: FLEXIBLE BRONCHOSCOPY;  Surgeon: Salena Saner, MD;  Location: [facility] ORS;  Service: Cardiopulmonary;  Laterality: Left;   GUM SURGERY     IR IMAGING GUIDED PORT INSERTION  06/13/2022   IR RADIOLOGIST EVAL & MGMT  10/06/2023   L4 - S1 decompression Left    PROSTATE BIOPSY     TONSILLECTOMY AND ADENOIDECTOMY  age 8   VIDEO BRONCHOSCOPY WITH ENDOBRONCHIAL ULTRASOUND Left 05/19/2022   Procedure: VIDEO BRONCHOSCOPY WITH ENDOBRONCHIAL ULTRASOUND;  Surgeon: Salena Saner, MD;  Location: [facility] ORS;  Service: Cardiopulmonary;  Laterality: Left;   XI ROBOTIC ASSISTED INGUINAL HERNIA REPAIR WITH MESH Right 10/21/2021   Procedure: XI ROBOTIC ASSISTED INGUINAL HERNIA REPAIR WITH MESH;  Surgeon: Campbell Lerner, MD;  Location: [facility] ORS;  Service: General;  Laterality: Right;   Family History  Problem Relation Age of Onset   Cancer Mother        secondary to cancinoma of the jaw from her dipping snuff.   Heart attack Father    CAD Father    Hypertension Sister    Diabetes Son        Type I diabetes   Prostate cancer Neg Hx    Kidney cancer Neg Hx    Social History   Tobacco Use   Smoking status: Former    Current packs/day: 0.00    Types: Cigarettes    Quit date: 08/26/1971    Years since quitting: 52.2    Passive exposure: Never   Smokeless tobacco:  Never   Tobacco comments:    Smoked for about 20 years.  Substance Use Topics   Alcohol use: Not Currently   Pertinent Clinical Results:  LABS:  Lab Results  Component Value Date   WBC 4.4 08/07/2023   HGB 10.5 (L) 08/07/2023   HCT 31.4 (L) 08/07/2023   MCV 101.0 (H) 08/07/2023   PLT 153 08/07/2023   Hospital Outpatient Visit on 10/21/2023  Component Date Value Ref Range Status   Sodium 10/21/2023 140  135 - 145 mmol/L Final   Potassium 10/21/2023 4.1  3.5 - 5.1 mmol/L Final   Chloride 10/21/2023 108  98 - 111 mmol/L Final   CO2 10/21/2023 27  22 - 32 mmol/L Final   Glucose, Bld 10/21/2023 83  70 - 99 mg/dL Final   Glucose reference range applies only to samples taken after fasting for at least 8 hours.   BUN 10/21/2023 33 (H)  8 - 23 mg/dL Final   Creatinine, Ser 10/21/2023 1.16  0.61 - 1.24 mg/dL Final   Calcium 16/05/9603 8.6 (L)  8.9 - 10.3 mg/dL Final   GFR, Estimated 10/21/2023 >60  >60 mL/min Final   Comment: (NOTE) Calculated using the CKD-EPI Creatinine Equation (2021)    Anion gap 10/21/2023 5  5 - 15 Final   Performed at [facility], [address] [place], [place] [address]    ECG: Date: 10/21/2023 Time ECG obtained: 1019 AM Rate: 50 bpm Rhythm:  Atrial fibrillation with slow ventricular response; RBBB Axis (leads I and aVF): left Intervals: QRS 146 ms. QTc 470 ms. ST segment and T wave changes: No evidence of acute T wave abnormalities or significant ST segment elevation or depression.  Evidence of a possible, age undetermined, prior infarct:  No Comparison: Similar to previous tracing obtained on 05/16/2022   IMAGING / PROCEDURES: CT CHEST ABDOMEN PELVIS W CONTRAST performed on 07/21/2023 Unchanged post treatment appearance of the perihilar and suprahilar left lung, with treated mass and consolidation of the medial left upper lobe. Previously seen left pleural effusion is resolved. No evidence of lymphadenopathy or metastatic disease in the  chest, abdomen, or pelvis. Unchanged small hyperenhancing lesions of the inferior right lobe of the liver, hepatic segment VI, most likely small flash filling hemangiomas or perhaps focal nodular hyperplasias. Continued attention on follow-up. Slight interval enlargement of a thickly septated cyst in the lateral midportion of the right kidney measuring 3.3 x 2.9 cm, previously 3.1 x 2.6 cm. This remains consistent with a slowly enlarging mixed solid and cystic renal cell carcinoma (  Bosniak category IV). Emphysema. Prostatomegaly. Coronary artery disease.  MR BRAIN W WO CONTRAST performed on 05/26/2022 No abnormal enhancement to suggest metastatic disease.  Remote insult in the subcortical high left frontal lobe with chronic blood products, nonenhancing. Additional small focus of remote hemorrhage in the superior right cerebellum, also nonenhancing.  Generalized cerebral volume loss.  No acute or subacute infarct, widespread hemorrhagic injury, hydrocephalus, or collection.  NM PET IMAGE INITIAL (PI) SKULL BASE TO THIGH performed on 05/07/2022 Left upper lobe primary bronchogenic carcinoma with ipsilateral nodal metastasis. Presuming non-small-cell histology, T3N2M0 or stage IIIB. Aortic atherosclerosis Coronary artery atherosclerosis  Emphysema Prostatomegaly Bladder calculi.  LONG TERM CARDIAC EVENT MONITOR STUDY performed on 06/04/2021 Predominant underlying atrial fibrillation with occasional PVCs.   Evidence of sick sinus syndrome and asymptomatic bradycardia with long RR intervals of >3 seconds noted.   Discussed potential for PPM placement for sick sinus syndrome if/when patient becomes more symptomatic.   TRANSTHORACIC ECHOCARDIOGRAM performed on 08/30/2020 LVEF 45-50% Mild left ventricular systolic dysfunction Normal right ventricular systolic function Mild MR and TR No AR or PR No valvular stenosis No pericardial effusion  Impression and Plan:  LIO WEHRLY has been  referred for pre-anesthesia review and clearance prior to him undergoing the planned anesthetic and procedural courses. Available labs, pertinent testing, and imaging results were personally reviewed by me in preparation for upcoming operative/procedural course. [facility] medical record has been updated following extensive record review and patient interview with PAT staff.   This patient has been appropriately cleared by cardiology with an overall MODERATE risk of experiencing significant perioperative cardiovascular complications. Based on clinical review performed today (11/01/23), barring any significant acute changes in the patient's overall condition, it is anticipated that he will be able to proceed with the planned surgical intervention. Any acute changes in clinical condition may necessitate his procedure being postponed and/or cancelled. Patient will meet with anesthesia team (MD and/or CRNA) on the day of his procedure for preoperative evaluation/assessment. Questions regarding anesthetic course will be fielded at that time.   Pre-surgical instructions were reviewed with the patient during his PAT appointment, and questions were fielded to satisfaction by PAT clinical staff. He has been instructed on which medications that he will need to hold prior to surgery, as well as the ones that have been deemed safe/appropriate to take on the day of his procedure. As part of the general education provided by PAT, patient made aware both verbally and in writing, that he would need to abstain from the use of any illegal substances during his perioperative course. He was advised that failure to follow the provided instructions could necessitate case cancellation or result in serious perioperative complications up to and including death. Patient encouraged to contact PAT and/or his surgeon's office to discuss any questions or concerns that may arise prior to surgery; verbalized understanding.   Quentin Mulling,  MSN, APRN, FNP-C, CEN [facility]  Perioperative Services Nurse Practitioner Phone: [phone] Fax: [phone] 11/01/23 9:07 PM  NOTE: This note has been prepared using Dragon dictation software. Despite my best ability to proofread, there is always the potential that unintentional transcriptional errors may still occur from this process.

## 2023-10-30 NOTE — Consult Note (Signed)
 Chief Complaint: Right renal lesion. Patient presents for right renal lesion biopsy and concurrent ablation.   Referring Physician(s): Dr. Irineo Axon  Supervising Physician: Malachy Moan  Patient Status: [facility] - Out-pt  History of Present Illness: Casey Gallegos is a 89 y.o. male utpatient. Former smoker. History of SCC of the LUL. HLD, DVT, CAD, HTN, A fib, RBBB, pulmonary emphysema, cardiomyopathy. Initially found to have an right renal lesion while being worked up for [facility] of LUL (August 2023). CT CAP from 11.26.24  shows the right renal lesion is enlarging. CT reads Slight interval enlargement of a thickly septated cyst in the lateral midportion of the right kidney measuring 3.3 x 2.9 cm, previously 3.1 x 2.6 cm (series 2, image 69). Multiple additional simple fluid signal renal cortical cysts bilaterally, for which no specific further follow-up or characterization is required. No calculi or hydronephrosis. The patient was seen for consultation in the Interventional Radiology Clinic  on 2.11.25 with IR Attending Dr. Malachy Moan. At that time a detailed discussion regarding the Patient's medical condition including but not limited to possible treatment options took place. Following that discussion the Patient and his wife elected to proceed with right renal lesion biopsy and concurrent percutaneous microwave ablation. The Patient presents today for ght renal lesion biopsy and concurrent percutaneous microwave ablation.   Currently without any significant complaints. Patient alert and laying in bed,calm. Denies any fevers, headache, chest pain, SOB, cough, abdominal pain, nausea, vomiting or bleeding.   Patient is on coumadin. Last dose approximately 5 days ago. All other labs and medications are within acceptable parameters. Allergies include cefdinir, doxycycline and prednisone. Patient has been NPO since midnight.   Return precautions and treatment recommendations and follow-up  discussed with the patient  who is agreeable with the plan.    Past Medical History:  Diagnosis Date   A-fib [facility])    a.) CHA2DS2-VASc = 6 (age x2, HTN, DVT x2, vascular disease history). b.) rate/rhythm maintained on oral carvedilol; chronically anticoagulated using daily warfarin.   Actinic keratosis    Adenomatous polyp    Allergic rhinitis    Anemia    Anticoagulated on warfarin    Aortic atherosclerosis (HCC)    Benign essential hypertension    Bilateral renal cysts    Bladder calculi    BPH with urinary obstruction    CAD (coronary artery disease)    Chronic venous insufficiency    DDD (degenerative disc disease), lumbar    a.) s/p LEFT laminectomy with L4-S1 decompression   Diverticulosis    DOE (dyspnea on exertion)    DVT (deep venous thrombosis) (HCC)    Elevated PSA    GERD (gastroesophageal reflux disease)    HLD (hyperlipidemia)    Lipoma of arm    Lipomatosis    NICM (nonischemic cardiomyopathy) (HCC) 11/22/2015   a.) TTE 11/22/2015: EF 40%. b.) TTE 12/27/2018: EF 45%. c.) TTE 08/30/2020: EF 45-50%.   Night sweats    Obesity    Osteoarthritis    Pulmonary emphysema (HCC)    RBBB (right bundle branch block)    Right inguinal hernia    Right renal mass 04/24/2022   a.) CT A/P 04/24/22: 2.7 x 2.8 cm enhancing septation lateral aspect RIGHT kidney concerning for cystic renal neoplasm; b.) CT chest 05/15/22: 3 cm RIGHT renal mass; c.) CT A/P 08/17/22: 2.9 x 2.6 cm exophytic mass; d.) CT A/P 11/19/22: 3.0 x 2.6 cm solid & cystic mass peripheral superior RIGHT renal pole c/w RCC (  Bosniak IV); e.) CT A/P 03/30/23: 3.1 x 2.6 cm; f.) CT A/P 07/21/23: 3.3 x 2.9 cm   Squamous cell lung cancer (HCC) 04/24/2022   a.)  CT chest 04/24/2022: Multilobulated perihilar mass measuring 5.3 x 3.7 x 2.8 cm with associated LEFT hilar and mediastinal LAD; b.)  PET CT 05/07/2022: Hypermetabolic LEFT upper lobe lung mass (max SUV 15.1); ipsilateral nodal metastasis --> presuming a NSC  histology, consistent with stage IIIb pulmonary neoplasm (T3 N2 M0) --> path (+) NSCLC (SCC)   Thoracic spondylosis    VHD (valvular heart disease) 11/22/2015   a.) TTE 11/22/2015: EF 40%; mod MR, mod-sev TR, triv PR; mod BAE, mild RV enlargement. b.) TTE 12/27/2018: EF 45%; mod MR, mod-sev TR, triv PR; mod BAE, mild RV enlargement. c.) TTE 08/30/2020: EF 45-50%; mild MR/TR.    Past Surgical History:  Procedure Laterality Date   Bilateral Radical Keratotomy Bilateral 1997   COLONOSCOPY WITH PROPOFOL N/A 01/11/2016   Procedure: COLONOSCOPY WITH PROPOFOL;  Surgeon: Scot Jun, MD;  Location: [facility] ENDOSCOPY;  Service: Endoscopy;  Laterality: N/A; repeat 3 years, tubular adenoma   DENTAL SURGERY     Patient had implants   FLEXIBLE BRONCHOSCOPY Left 05/19/2022   Procedure: FLEXIBLE BRONCHOSCOPY;  Surgeon: Salena Saner, MD;  Location: [facility] ORS;  Service: Cardiopulmonary;  Laterality: Left;   GUM SURGERY     IR IMAGING GUIDED PORT INSERTION  06/13/2022   IR RADIOLOGIST EVAL & MGMT  10/06/2023   L4 - S1 decompression Left    PROSTATE BIOPSY     TONSILLECTOMY AND ADENOIDECTOMY  age 37   VIDEO BRONCHOSCOPY WITH ENDOBRONCHIAL ULTRASOUND Left 05/19/2022   Procedure: VIDEO BRONCHOSCOPY WITH ENDOBRONCHIAL ULTRASOUND;  Surgeon: Salena Saner, MD;  Location: [facility] ORS;  Service: Cardiopulmonary;  Laterality: Left;   XI ROBOTIC ASSISTED INGUINAL HERNIA REPAIR WITH MESH Right 10/21/2021   Procedure: XI ROBOTIC ASSISTED INGUINAL HERNIA REPAIR WITH MESH;  Surgeon: Campbell Lerner, MD;  Location: [facility] ORS;  Service: General;  Laterality: Right;    Allergies: Cefdinir, Doxycycline, Prednisone, and Sertraline  Medications: Prior to Admission medications   Medication Sig Start Date End Date Taking? Authorizing Provider  amLODipine (NORVASC) 5 MG tablet TAKE 1 TABLET (5 MG TOTAL) BY MOUTH DAILY. 09/19/22   Simmons-Robinson, Makiera, MD  benazepril (LOTENSIN) 40 MG tablet Take 1 tablet (40 mg  total) by mouth daily. 12/22/22   Simmons-Robinson, Makiera, MD  carvedilol (COREG) 6.25 MG tablet TAKE 1 TABLET (6.25 MG TOTAL) BY MOUTH 2 (TWO) TIMES DAILY. 09/19/22   Simmons-Robinson, Makiera, MD  Cyanocobalamin (VITAMIN B 12 PO) Take 1 capsule by mouth 2 (two) times daily. 1000 mcg each capsule    [provider]  doxazosin (CARDURA) 8 MG tablet Take 8 mg by mouth at bedtime.    [provider]  finasteride (PROSCAR) 5 MG tablet TAKE 1 TABLET EVERY DAY 02/12/23   McGowan, Carollee Herter A, PA-C  gabapentin (NEURONTIN) 300 MG capsule TAKE 1 CAPSULE EVERY MORNING, 1 CAPSULE IN THE AFTERNOON AND 2 CAPSULES AT BEDTIME Patient taking differently: Take 300 mg by mouth 2 (two) times daily. 04/08/23   Simmons-Robinson, Tawanna Cooler, MD  Magnesium 500 MG TABS Take 500 mg by mouth daily.    [provider]  Multiple Vitamin (MULTI-VITAMIN) tablet Take 1 tablet by mouth daily.     [provider]  oxyCODONE (OXY IR/ROXICODONE) 5 MG immediate release tablet Take 1 tablet (5 mg total) by mouth every 6 (six) hours as needed for severe  pain. 12/03/22   Simmons-Robinson, Tawanna Cooler, MD  triamterene-hydrochlorothiazide (MAXZIDE-25) 37.5-25 MG tablet TAKE 1/2 TABLET EVERY DAY 03/05/23   Simmons-Robinson, Tawanna Cooler, MD  warfarin (COUMADIN) 5 MG tablet TAKE 1 TABLET EVERY DAY Patient taking differently: Take 5 mg by mouth daily. Thursday 2.5 mg; all other days 5 mg 03/30/23   Simmons-Robinson, Tawanna Cooler, MD     Family History  Problem Relation Age of Onset   Cancer Mother        secondary to cancinoma of the jaw from her dipping snuff.   Heart attack Father    CAD Father    Hypertension Sister    Diabetes Son        Type I diabetes   Prostate cancer Neg Hx    Kidney cancer Neg Hx     Social History   Socioeconomic History   Marital status: Married    Spouse name: Doris   Number of children: 3   Years of education: Not on file   Highest education level: Associate degree: occupational,  Scientist, product/process development, or vocational program  Occupational History   Occupation: retired  Tobacco Use   Smoking status: Former    Current packs/day: 0.00    Types: Cigarettes    Quit date: 08/26/1971    Years since quitting: 52.2    Passive exposure: Never   Smokeless tobacco: Never   Tobacco comments:    Smoked for about 20 years.  Vaping Use   Vaping status: Never Used  Substance and Sexual Activity   Alcohol use: Not Currently   Drug use: No   Sexual activity: Not Currently  Other Topics Concern   Not on file  Social History Narrative   Not on file   Social Drivers of Health   Financial Resource Strain: Low Risk  (03/20/2023)   Received from [facility]   Overall Financial Resource Strain (CARDIA)    Difficulty of Paying Living Expenses: Not very hard  Food Insecurity: No Food Insecurity (03/20/2023)   Received from [facility]   Hunger Vital Sign    Worried About Running Out of Food in the Last Year: Never true    Ran Out of Food in the Last Year: Never true  Transportation Needs: No Transportation Needs (03/20/2023)   Received from [facility] - Transportation    In the past 12 months, has lack of transportation kept you from medical appointments or from getting medications?: No    Lack of Transportation (Non-Medical): No  Physical Activity: Sufficiently Active (12/23/2022)   Exercise Vital Sign    Days of Exercise per Week: 7 days    Minutes of Exercise per Session: 50 min  Stress: No Stress Concern Present (12/18/2021)   [employer] of Occupational Health - Occupational Stress Questionnaire    Feeling of Stress : Only a little  Social Connections: Moderately Integrated (12/23/2022)   Social Connection and Isolation Panel [NHANES]    Frequency of Communication with Friends and Family: Twice a week    Frequency of Social Gatherings with Friends and Family: Once a week    Attends Religious Services: More than 4  times per year    Active Member of [employer] or Organizations: No    Attends Banker Meetings: Never    Marital Status: Married      Review of Systems: A 12 point ROS discussed and pertinent positives are indicated in the HPI above.  All other systems are negative.  Review  of Systems  Constitutional:  Negative for fever.  HENT:  Negative for congestion.   Respiratory:  Negative for cough and shortness of breath.   Cardiovascular:  Negative for chest pain.  Gastrointestinal:  Negative for abdominal pain.  Neurological:  Negative for headaches.  Psychiatric/Behavioral:  Negative for behavioral problems and confusion.     Vital Signs: BP (!) 161/92 (BP Location: Right Arm)   Pulse (!) 51   Temp (!) 97.5 F (36.4 C) (Oral)   Resp 11   Ht 5\' 9"  (1.753 m)   Wt 155 lb 11.2 oz (70.6 kg)   SpO2 97%   BMI 22.99 kg/m   Advance Care Plan: The advanced care plan/surrogate decision maker was discussed at the time of visit and documented in the medical record. wife   Physical Exam Vitals and nursing note reviewed.  Constitutional:      Appearance: He is well-developed.  HENT:     Head: Normocephalic.  Cardiovascular:     Rate and Rhythm: Bradycardia present. Rhythm irregular.  Pulmonary:     Effort: Pulmonary effort is normal.  Musculoskeletal:        General: Normal range of motion.     Cervical back: Normal range of motion.  Skin:    General: Skin is warm and dry.  Neurological:     General: No focal deficit present.     Mental Status: He is alert and oriented to person, place, and time. Mental status is at baseline.  Psychiatric:        Mood and Affect: Mood normal.        Behavior: Behavior normal.        Thought Content: Thought content normal.        Judgment: Judgment normal.     Imaging: IR Radiologist Eval & Mgmt Result Date: 10/06/2023 EXAM: NEW PATIENT OFFICE VISIT CHIEF COMPLAINT: SEE NOTE IN EPIC HISTORY OF PRESENT ILLNESS: SEE NOTE IN EPIC REVIEW  OF SYSTEMS: SEE NOTE IN EPIC PHYSICAL EXAMINATION: SEE NOTE IN EPIC ASSESSMENT AND PLAN: SEE NOTE IN EPIC Electronically Signed   By: Malachy Moan M.D.   On: 10/06/2023 13:51    Labs:  CBC: Recent Labs    05/15/23 1007 06/12/23 0907 07/10/23 0844 08/07/23 0954  WBC 3.8* 4.1 3.6* 4.4  HGB 10.2* 10.8* 10.3* 10.5*  HCT 30.3* 32.0* 31.1* 31.4*  PLT 101* 121* 109* 153    COAGS: Recent Labs    02/23/23 0922 03/11/23 1425  INR 1.9*  1.9 2.3  2.3    BMP: Recent Labs    06/12/23 0907 07/10/23 0844 08/07/23 0954 10/21/23 1022  NA 138 141 140 140  K 3.9 3.7 4.3 4.1  CL 109 111 110 108  CO2 24 24 25 27   GLUCOSE 115* 132* 99 83  BUN 19 25* 25* 33*  CALCIUM 8.5* 8.6* 8.6* 8.6*  CREATININE 0.95 1.19 0.99 1.16  GFRNONAA >60 59* >60 >60    LIVER FUNCTION TESTS: Recent Labs    05/15/23 1007 06/12/23 0907 07/10/23 0844 08/07/23 0954  BILITOT 1.2 1.0 0.9 0.5  AST 18 22 20 17   ALT 16 17 16 14   ALKPHOS 51 58 58 55  PROT 5.7* 6.0* 6.0* 5.9*  ALBUMIN 3.3* 3.6 3.5 3.4*      Assessment and Plan:  89 y.o. male outpatient. Former smoker. History of SCC of the LUL. HLD, DVT, CAD, HTN, A fib, RBBB, pulmonary emphysema, cardiomyopathy. Initially found to have an right renal lesion while being worked  up for [facility] of LUL (August 2023). CT CAP from 11.26.24  shows the right renal lesion is enlarging. CT reads Slight interval enlargement of a thickly septated cyst in the lateral midportion of the right kidney measuring 3.3 x 2.9 cm, previously 3.1 x 2.6 cm (series 2, image 69). Multiple additional simple fluid signal renal cortical cysts bilaterally, for which no specific further follow-up or characterization is required. No calculi or hydronephrosis. The patient was seen for consultation in the Interventional Radiology Clinic  on 2.11.25 with IR Attending Dr. Malachy Moan. At that time a detailed discussion regarding the Patient's medical condition including but not limited to  possible treatment options took place. Following that discussion the Patient and his wife elected to proceed with right renal lesion biopsy and concurrent percutaneous microwave ablation. The Patient presents today for ght renal lesion biopsy and concurrent percutaneous microwave ablation.    PLAN: IR Image guided right renal lesion biopsy and concurrent percutaneous microwave ablation  The Risks and benefits of embolization were discussed with the patient including, but not limited to bleeding, infection, vascular injury, post operative pain, or contrast induced renal failure.  This procedure involves the use of X-rays and because of the nature of the planned procedure, it is possible that we will have prolonged use of X-ray fluoroscopy.  Potential radiation risks to you include (but are not limited to) the following: - A slightly elevated risk for cancer several years later in life. This risk is typically less than 0.5% percent. This risk is low in comparison to the normal incidence of human cancer, which is 33% for women and 50% for men according to the American Cancer Society. - Radiation induced injury can include skin redness, resembling a rash, tissue breakdown / ulcers and hair loss (which can be temporary or permanent).   The likelihood of either of these occurring depends on the difficulty of the procedure and whether you are sensitive to radiation due to previous procedures, disease, or genetic conditions.   IF your procedure requires a prolonged use of radiation, you will be notified and given written instructions for further action.  It is your responsibility to monitor the irradiated area for the 2 weeks following the procedure and to notify your physician if you are concerned that you have suffered a radiation induced injury.    All of the patient's questions were answered, patient is agreeable to proceed. Consent signed and in chart.    Thank you for this interesting  consult.  I greatly enjoyed meeting DEMETRIC DUNNAWAY and look forward to participating in their care.  A copy of this report was sent to the requesting provider on this date.  Electronically Signed: Alene Mires, NP 11/02/2023, 7:52 AM   I spent a total of    25 Minutes in face to face in clinical consultation, greater than 50% of which was counseling/coordinating care for right renal lesion and concurrent microwave biopsy

## 2023-11-02 ENCOUNTER — Encounter: Payer: Self-pay | Admitting: Urgent Care

## 2023-11-02 ENCOUNTER — Other Ambulatory Visit: Payer: Self-pay

## 2023-11-02 ENCOUNTER — Ambulatory Visit
Admission: RE | Admit: 2023-11-02 | Discharge: 2023-11-02 | Disposition: A | Discharge status: Home / Self Care | Payer: HMO | Source: Ambulatory Visit | Attending: Interventional Radiology | Admitting: Interventional Radiology

## 2023-11-02 ENCOUNTER — Other Ambulatory Visit: Payer: Self-pay | Admitting: Interventional Radiology

## 2023-11-02 ENCOUNTER — Other Ambulatory Visit (HOSPITAL_COMMUNITY): Payer: Self-pay | Admitting: Radiology

## 2023-11-02 DIAGNOSIS — C641 Malignant neoplasm of right kidney, except renal pelvis: Secondary | ICD-10-CM | POA: Diagnosis not present

## 2023-11-02 DIAGNOSIS — I4891 Unspecified atrial fibrillation: Secondary | ICD-10-CM | POA: Diagnosis not present

## 2023-11-02 DIAGNOSIS — N2889 Other specified disorders of kidney and ureter: Secondary | ICD-10-CM

## 2023-11-02 DIAGNOSIS — D49511 Neoplasm of unspecified behavior of right kidney: Secondary | ICD-10-CM | POA: Insufficient documentation

## 2023-11-02 DIAGNOSIS — N281 Cyst of kidney, acquired: Secondary | ICD-10-CM

## 2023-11-02 HISTORY — DX: Emphysema, unspecified: J43.9

## 2023-11-02 HISTORY — DX: Benign prostatic hyperplasia with lower urinary tract symptoms: N13.8

## 2023-11-02 HISTORY — DX: Spondylosis without myelopathy or radiculopathy, thoracic region: M47.814

## 2023-11-02 HISTORY — DX: Benign neoplasm, unspecified site: D36.9

## 2023-11-02 HISTORY — DX: Allergic rhinitis, unspecified: J30.9

## 2023-11-02 HISTORY — DX: Atherosclerotic heart disease of native coronary artery without angina pectoris: I25.10

## 2023-11-02 HISTORY — DX: Other obstructive and reflux uropathy: N40.1

## 2023-11-02 HISTORY — DX: Long term (current) use of anticoagulants: Z79.01

## 2023-11-02 LAB — CBC
HCT: 35.2 % — ABNORMAL LOW (ref 39.0–52.0)
Hemoglobin: 11.6 g/dL — ABNORMAL LOW (ref 13.0–17.0)
MCH: 33.6 pg (ref 26.0–34.0)
MCHC: 33 g/dL (ref 30.0–36.0)
MCV: 102 fL — ABNORMAL HIGH (ref 80.0–100.0)
Platelets: 117 10*3/uL — ABNORMAL LOW (ref 150–400)
RBC: 3.45 MIL/uL — ABNORMAL LOW (ref 4.22–5.81)
RDW: 14.6 % (ref 11.5–15.5)
WBC: 3.8 10*3/uL — ABNORMAL LOW (ref 4.0–10.5)
nRBC: 0 % (ref 0.0–0.2)

## 2023-11-02 LAB — PROTIME-INR
INR: 1.3 — ABNORMAL HIGH (ref 0.8–1.2)
Prothrombin Time: 16.8 s — ABNORMAL HIGH (ref 11.4–15.2)

## 2023-11-02 LAB — BASIC METABOLIC PANEL
Anion gap: 6 (ref 5–15)
BUN: 23 mg/dL (ref 8–23)
CO2: 26 mmol/L (ref 22–32)
Calcium: 8.8 mg/dL — ABNORMAL LOW (ref 8.9–10.3)
Chloride: 109 mmol/L (ref 98–111)
Creatinine, Ser: 1.06 mg/dL (ref 0.61–1.24)
GFR, Estimated: >60 mL/min (ref >60)
Glucose, Bld: 109 mg/dL — ABNORMAL HIGH (ref 70–99)
Potassium: 3.6 mmol/L (ref 3.5–5.1)
Sodium: 141 mmol/L (ref 135–145)

## 2023-11-02 LAB — TYPE AND SCREEN
ABO/RH(D): A POS
Antibody Screen: NEGATIVE

## 2023-11-02 MED ORDER — PHENYLEPHRINE HCL-NACL 20-0.9 MG/250ML-% IV SOLN
INTRAVENOUS | Status: AC
  Filled 2023-11-02: qty 250

## 2023-11-02 MED ORDER — GLYCOPYRROLATE 0.2 MG/ML IJ SOLN
INTRAMUSCULAR | Status: DC | PRN
  Administered 2023-11-02: .2 mg via INTRAVENOUS

## 2023-11-02 MED ORDER — FENTANYL CITRATE (PF) 100 MCG/2ML IJ SOLN
INTRAMUSCULAR | Status: DC | PRN
  Administered 2023-11-02: 25 ug via INTRAVENOUS
  Administered 2023-11-02: 50 ug via INTRAVENOUS
  Administered 2023-11-02: 25 ug via INTRAVENOUS

## 2023-11-02 MED ORDER — HYDROCODONE-ACETAMINOPHEN 5-325 MG PO TABS: 1.0000 | ORAL_TABLET | ORAL | Status: DC | PRN

## 2023-11-02 MED ORDER — FENTANYL CITRATE (PF) 100 MCG/2ML IJ SOLN
INTRAMUSCULAR | Status: AC
  Filled 2023-11-02: qty 2

## 2023-11-02 MED ORDER — SUCCINYLCHOLINE CHLORIDE 200 MG/10ML IV SOSY
PREFILLED_SYRINGE | INTRAVENOUS | Status: DC | PRN
  Administered 2023-11-02: 80 mg via INTRAVENOUS

## 2023-11-02 MED ORDER — SODIUM CHLORIDE 0.9% FLUSH
3.0000 mL | Freq: Two times a day (BID) | INTRAVENOUS | Status: DC
Start: 2023-11-02 — End: 2023-11-03

## 2023-11-02 MED ORDER — PROPOFOL 10 MG/ML IV BOLUS
INTRAVENOUS | Status: DC | PRN
  Administered 2023-11-02: 110 mg via INTRAVENOUS

## 2023-11-02 MED ORDER — ONDANSETRON HCL 4 MG/2ML IJ SOLN
INTRAMUSCULAR | Status: DC | PRN
  Administered 2023-11-02 (×2): 4 mg via INTRAVENOUS

## 2023-11-02 MED ORDER — SUGAMMADEX SODIUM 200 MG/2ML IV SOLN
INTRAVENOUS | Status: DC | PRN
  Administered 2023-11-02: 200 mg via INTRAVENOUS

## 2023-11-02 MED ORDER — VANCOMYCIN HCL IN DEXTROSE 1-5 GM/200ML-% IV SOLN
INTRAVENOUS | Status: AC
  Filled 2023-11-02: qty 200

## 2023-11-02 MED ORDER — PROPOFOL 1000 MG/100ML IV EMUL
INTRAVENOUS | Status: AC
  Filled 2023-11-02: qty 100

## 2023-11-02 MED ORDER — ROCURONIUM BROMIDE 100 MG/10ML IV SOLN
INTRAVENOUS | Status: DC | PRN
  Administered 2023-11-02: 20 mg via INTRAVENOUS
  Administered 2023-11-02: 50 mg via INTRAVENOUS

## 2023-11-02 MED ORDER — DEXAMETHASONE SODIUM PHOSPHATE 10 MG/ML IJ SOLN
INTRAMUSCULAR | Status: DC | PRN
  Administered 2023-11-02: 5 mg via INTRAVENOUS

## 2023-11-02 MED ORDER — EPHEDRINE SULFATE-NACL 50-0.9 MG/10ML-% IV SOSY
PREFILLED_SYRINGE | INTRAVENOUS | Status: DC | PRN
  Administered 2023-11-02 (×2): 5 mg via INTRAVENOUS
  Administered 2023-11-02: 10 mg via INTRAVENOUS

## 2023-11-02 MED ORDER — VANCOMYCIN HCL IN DEXTROSE 1-5 GM/200ML-% IV SOLN
1000.0000 mg | Freq: Once | INTRAVENOUS | Status: AC
  Administered 2023-11-02: 1000 mg via INTRAVENOUS
  Filled 2023-11-02: qty 200

## 2023-11-02 MED ORDER — PHENYLEPHRINE HCL-NACL 20-0.9 MG/250ML-% IV SOLN
INTRAVENOUS | Status: DC | PRN
  Administered 2023-11-02: 10 ug/min via INTRAVENOUS

## 2023-11-02 MED ORDER — ONDANSETRON HCL 4 MG/2ML IJ SOLN: 4.0000 mg | Freq: Four times a day (QID) | INTRAMUSCULAR | Status: DC | PRN

## 2023-11-02 MED ORDER — LIDOCAINE HCL (CARDIAC) PF 100 MG/5ML IV SOSY
PREFILLED_SYRINGE | INTRAVENOUS | Status: DC | PRN
  Administered 2023-11-02: 80 mg via INTRAVENOUS

## 2023-11-02 MED ORDER — SODIUM CHLORIDE 0.9 % IV SOLN: INTRAVENOUS | Status: DC | PRN

## 2023-11-02 MED ORDER — PHENYLEPHRINE 80 MCG/ML (10ML) SYRINGE FOR IV PUSH (FOR BLOOD PRESSURE SUPPORT)
PREFILLED_SYRINGE | INTRAVENOUS | Status: DC | PRN
  Administered 2023-11-02: 80 ug via INTRAVENOUS

## 2023-11-02 MED ORDER — SODIUM CHLORIDE 0.9% FLUSH: 3.0000 mL | INTRAVENOUS | Status: DC | PRN

## 2023-11-02 NOTE — Transfer of Care (Signed)
 Immediate Anesthesia Transfer of Care Note  [name]  Procedure(s) Performed: CT RENAL TUMOR ABLATION UNILATERAL CT RENAL BIOPSY  Patient Location: PACU  Anesthesia Type:General  Level of Consciousness: awake, drowsy, and patient cooperative  Airway & Oxygen Therapy: Patient Spontanous Breathing and Patient connected to face mask oxygen  Post-op Assessment: Report given to RN and Post -op Vital signs reviewed and stable  Post vital signs: Reviewed and stable  Last Vitals:  Vitals Value Taken Time  BP 162/90 11/02/23 1051  Temp    Pulse 54 11/02/23 1058  Resp 11 11/02/23 1058  SpO2 99 % 11/02/23 1058  Vitals shown include unfiled device data.  Last Pain:  Vitals:   11/02/23 0751  TempSrc: Oral  PainSc: 0-No pain         Complications: No notable events documented.

## 2023-11-02 NOTE — Anesthesia Postprocedure Evaluation (Signed)
 Anesthesia Post Note  [name]  Procedure(s) Performed: CT RENAL TUMOR ABLATION UNILATERAL CT RENAL BIOPSY  Patient location during evaluation: PACU Anesthesia Type: General Level of consciousness: awake and alert Pain management: pain level controlled Vital Signs Assessment: post-procedure vital signs reviewed and stable Respiratory status: spontaneous breathing, nonlabored ventilation, respiratory function stable and patient connected to nasal cannula oxygen Cardiovascular status: blood pressure returned to baseline and stable Postop Assessment: no apparent nausea or vomiting Anesthetic complications: no  No notable events documented.   Last Vitals:  Vitals:   11/02/23 1200 11/02/23 1215  BP: (!) 138/107 (!) 147/89  Pulse: (!) 56 65  Resp: 14 16  Temp:    SpO2: 95% 95%    Last Pain:  Vitals:   11/02/23 1200  TempSrc:   PainSc: 5                  Stephanie Coup

## 2023-11-02 NOTE — Discharge Instructions (Signed)
-  Resume Coumadin 11/03/2023 -Dressing can be removed in 24 hours and you may apply a bandaid if you would like to

## 2023-11-02 NOTE — Anesthesia Preprocedure Evaluation (Signed)
 Anesthesia Evaluation  Patient identified by MRN, date of birth, ID band Patient awake    Reviewed: Allergy & Precautions, NPO status , Patient's Chart, lab work & pertinent test results  History of Anesthesia Complications Negative for: history of anesthetic complications  Airway Mallampati: III  TM Distance: >3 FB Neck ROM: full    Dental  (+) Chipped, Dental Advidsory Given   Pulmonary COPD, former smoker   Pulmonary exam normal breath sounds clear to auscultation       Cardiovascular hypertension, +CHF and + DOE  + dysrhythmias Atrial Fibrillation  Rhythm:Irregular Rate:Normal  ECG 05/16/22:  Atrial fibrillation with premature ventricular or aberrantly conducted complexes Right bundle branch block  Echo 08/30/20:  MILD LV SYSTOLIC DYSFUNCTION  NORMAL RIGHT VENTRICULAR SYSTOLIC FUNCTION  MILD VALVULAR REGURGITATION   NO VALVULAR STENOSIS  MILD MR, TR  EF 45-50%    Neuro/Psych negative neurological ROS  negative psych ROS   GI/Hepatic Neg liver ROS,GERD  Medicated and Controlled,,  Endo/Other  negative endocrine ROS    Renal/GU Renal disease     Musculoskeletal   Abdominal   Peds  Hematology negative hematology ROS (+)   Anesthesia Other Findings Past Medical History: No date: A-fib [facility])     Comment:  a.) CHA2DS2-VASc = 6 (age x2, HTN, DVT x2, vascular               disease history). b.) rate/rhythm maintained on oral               carvedilol; chronically anticoagulated using daily               warfarin. No date: Actinic keratosis No date: Adenomatous polyp No date: Allergic rhinitis No date: Anemia No date: Anticoagulated on warfarin No date: Aortic atherosclerosis (HCC) No date: Benign essential hypertension No date: Bilateral renal cysts No date: Bladder calculi No date: BPH with urinary obstruction No date: CAD (coronary artery disease) No date: Chronic venous insufficiency No date: DDD  (degenerative disc disease), lumbar     Comment:  a.) s/p LEFT laminectomy with L4-S1 decompression No date: Diverticulosis No date: DOE (dyspnea on exertion) No date: DVT (deep venous thrombosis) (HCC) No date: Elevated PSA No date: GERD (gastroesophageal reflux disease) No date: HLD (hyperlipidemia) No date: Lipoma of arm No date: Lipomatosis 11/22/2015: NICM (nonischemic cardiomyopathy) (HCC)     Comment:  a.) TTE 11/22/2015: EF 40%. b.) TTE 12/27/2018: EF 45%.               c.) TTE 08/30/2020: EF 45-50%. No date: Night sweats No date: Obesity No date: Osteoarthritis No date: Pulmonary emphysema (HCC) No date: RBBB (right bundle branch block) No date: Right inguinal hernia 04/24/2022: Right renal mass     Comment:  a.) CT A/P 04/24/22: 2.7 x 2.8 cm enhancing septation               lateral aspect RIGHT kidney concerning for cystic renal               neoplasm; b.) CT chest 05/15/22: 3 cm RIGHT renal mass;               c.) CT A/P 08/17/22: 2.9 x 2.6 cm exophytic mass; d.) CT               A/P 11/19/22: 3.0 x 2.6 cm solid & cystic mass peripheral              superior RIGHT renal pole c/w RCC (Bosniak  IV); e.) CT               A/P 03/30/23: 3.1 x 2.6 cm; f.) CT A/P 07/21/23: 3.3 x               2.9 cm 04/24/2022: Squamous cell lung cancer (HCC)     Comment:  a.)  CT chest 04/24/2022: Multilobulated perihilar mass               measuring 5.3 x 3.7 x 2.8 cm with associated LEFT hilar               and mediastinal LAD; b.)  PET CT 05/07/2022:               Hypermetabolic LEFT upper lobe lung mass (max SUV 15.1);               ipsilateral nodal metastasis --> presuming a NSC               histology, consistent with stage IIIb pulmonary neoplasm               (T3 N2 M0) --> path (+) NSCLC (SCC) No date: Thoracic spondylosis 11/22/2015: VHD (valvular heart disease)     Comment:  a.) TTE 11/22/2015: EF 40%; mod MR, mod-sev TR, triv PR;              mod BAE, mild RV enlargement. b.)  TTE 12/27/2018: EF 45%;              mod MR, mod-sev TR, triv PR; mod BAE, mild RV               enlargement. c.) TTE 08/30/2020: EF 45-50%; mild MR/TR.  Past Surgical History: 1997: Bilateral Radical Keratotomy; Bilateral 01/11/2016: COLONOSCOPY WITH PROPOFOL; N/A     Comment:  Procedure: COLONOSCOPY WITH PROPOFOL;  Surgeon: Scot Jun, MD;  Location: [facility] ENDOSCOPY;  Service:               Endoscopy;  Laterality: N/A; repeat 3 years, tubular               adenoma No date: DENTAL SURGERY     Comment:  Patient had implants 05/19/2022: FLEXIBLE BRONCHOSCOPY; Left     Comment:  Procedure: FLEXIBLE BRONCHOSCOPY;  Surgeon: Salena Saner, MD;  Location: [facility] ORS;  Service:               Cardiopulmonary;  Laterality: Left; No date: GUM SURGERY 06/13/2022: IR IMAGING GUIDED PORT INSERTION 10/06/2023: IR RADIOLOGIST EVAL & MGMT No date: L4 - S1 decompression; Left No date: PROSTATE BIOPSY age 46: TONSILLECTOMY AND ADENOIDECTOMY 05/19/2022: VIDEO BRONCHOSCOPY WITH ENDOBRONCHIAL ULTRASOUND; Left     Comment:  Procedure: VIDEO BRONCHOSCOPY WITH ENDOBRONCHIAL               ULTRASOUND;  Surgeon: Salena Saner, MD;  Location:               [facility] ORS;  Service: Cardiopulmonary;  Laterality: Left; 10/21/2021: XI ROBOTIC ASSISTED INGUINAL HERNIA REPAIR WITH MESH;  Right     Comment:  Procedure: XI ROBOTIC ASSISTED INGUINAL HERNIA REPAIR               WITH MESH;  Surgeon: Campbell Lerner, MD;  Location:  [facility] ORS;  Service: General;  Laterality: Right;  BMI    Body Mass Index: 22.99 kg/m      Reproductive/Obstetrics negative OB ROS                             Anesthesia Physical Anesthesia Plan  ASA: 3  Anesthesia Plan: General ETT   Post-op Pain Management:    Induction: Intravenous  PONV Risk Score and Plan: Ondansetron and Dexamethasone  Airway Management Planned: Oral ETT  Additional Equipment:    Intra-op Plan:   Post-operative Plan: Extubation in OR  Informed Consent: I have reviewed the patients History and Physical, chart, labs and discussed the procedure including the risks, benefits and alternatives for the proposed anesthesia with the patient or authorized representative who has indicated his/her understanding and acceptance.     Dental Advisory Given  Plan Discussed with: Anesthesiologist, CRNA and Surgeon  Anesthesia Plan Comments: (Patient consented for risks of anesthesia including but not limited to:  - adverse reactions to medications - damage to eyes, teeth, lips or other oral mucosa - nerve damage due to positioning  - sore throat or hoarseness - Damage to heart, brain, nerves, lungs, other parts of body or loss of life  Patient voiced understanding and assent.)       Anesthesia Quick Evaluation

## 2023-11-02 NOTE — Anesthesia Procedure Notes (Signed)
 Procedure Name: Intubation Date/Time: 11/02/2023 8:54 AM  Performed by: Mohammed Kindle, CRNAPre-anesthesia Checklist: Patient identified, Emergency Drugs available, Suction available and Patient being monitored Patient Re-evaluated:Patient Re-evaluated prior to induction Oxygen Delivery Method: Circle system utilized Preoxygenation: Pre-oxygenation with 100% oxygen Induction Type: IV induction Ventilation: Mask ventilation without difficulty Laryngoscope Size: McGrath and 3 Grade View: Grade I Tube type: Oral Tube size: 6.5 mm Number of attempts: 1 Airway Equipment and Method: Stylet and Oral airway Placement Confirmation: ETT inserted through vocal cords under direct vision, positive ETCO2 and breath sounds checked- equal and bilateral Secured at: 21 cm Tube secured with: Tape Dental Injury: Teeth and Oropharynx as per pre-operative assessment

## 2023-11-02 NOTE — Procedures (Signed)
 Interventional Radiology Procedure Note  Procedure: CT guided biopsy and MWA of right renal mass  Complications: None  Estimated Blood Loss: None  Recommendations: - Path sent - Bedrest x 4 hrs - DC home   Signed,  Sterling Big, MD

## 2023-11-05 LAB — SURGICAL PATHOLOGY

## 2023-11-12 DIAGNOSIS — I482 Chronic atrial fibrillation, unspecified: Secondary | ICD-10-CM | POA: Diagnosis not present

## 2023-11-12 DIAGNOSIS — Z7901 Long term (current) use of anticoagulants: Secondary | ICD-10-CM | POA: Diagnosis not present

## 2023-11-12 DIAGNOSIS — E782 Mixed hyperlipidemia: Secondary | ICD-10-CM | POA: Diagnosis not present

## 2023-11-12 DIAGNOSIS — I10 Essential (primary) hypertension: Secondary | ICD-10-CM | POA: Diagnosis not present

## 2023-11-12 DIAGNOSIS — R9431 Abnormal electrocardiogram [ECG] [EKG]: Secondary | ICD-10-CM | POA: Diagnosis not present

## 2023-11-19 ENCOUNTER — Ambulatory Visit
Admission: RE | Admit: 2023-11-19 | Discharge: 2023-11-19 | Disposition: A | Discharge status: Home / Self Care | Source: Ambulatory Visit | Attending: Radiology | Admitting: Radiology

## 2023-11-19 DIAGNOSIS — D3001 Benign neoplasm of right kidney: Secondary | ICD-10-CM | POA: Diagnosis not present

## 2023-11-19 DIAGNOSIS — N281 Cyst of kidney, acquired: Secondary | ICD-10-CM

## 2023-11-19 HISTORY — PX: IR RADIOLOGIST EVAL & MGMT: IMG5224

## 2023-11-19 NOTE — Progress Notes (Signed)
 Chief Complaint: Patient was seen in consultation today for No chief complaint on file.  at the request of Gallegos,Casey C  Referring Physician(s): Gallegos,Casey C  History of Present Illness: Casey Gallegos is a 89 y.o. 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  At the time of his initial workup, imaging also demonstrated a complex Bosniak 4 cyst exophytic from the posterolateral aspect of the upper pole of the right kidney.   He underwent concurrent chemoradiation therapy completed in December 2023 followed by durvalumab beginning in January 2024.   On routine surveillance imaging the right renal mass was found to have slightly enlarged.  He was evaluated by Dr. Lonna Gallegos of urology.  Naturally, given his advanced age and known lung malignancy he is a suboptimal surgical candidate for total or partial nephrectomy.   Casey Gallegos successfully underwent CT-guided biopsy and same time percutaneous thermal ablation on 11/02/2023.  His procedure was uneventful and he was discharged home the same day.  He and his wife come into the clinic to see me today for his 2-week follow-up evaluation.  Casey Gallegos reports that he is doing great.  He has had no pain, hematuria or any other symptoms following his ablation procedure.  He feels like he had nothing done.  We reviewed the pathology results which were consistent with a low-grade oncocytic tumor (LOT).  This is a benign/indolent tumor type and this biopsy diagnosis is very reassuring.  Past Medical History:  Diagnosis Date   A-fib [facility])    a.) CHA2DS2-VASc = 6 (age x2, HTN, DVT x2, vascular disease history). b.) rate/rhythm maintained on oral carvedilol; chronically anticoagulated using daily warfarin.   Actinic keratosis    Adenomatous polyp    Allergic rhinitis    Anemia    Anticoagulated on warfarin    Aortic atherosclerosis (HCC)    Benign essential  hypertension    Bilateral renal cysts    Bladder calculi    BPH with urinary obstruction    CAD (coronary artery disease)    Chronic venous insufficiency    DDD (degenerative disc disease), lumbar    a.) s/p LEFT laminectomy with L4-S1 decompression   Diverticulosis    DOE (dyspnea on exertion)    DVT (deep venous thrombosis) (HCC)    Elevated PSA    GERD (gastroesophageal reflux disease)    HLD (hyperlipidemia)    Lipoma of arm    Lipomatosis    NICM (nonischemic cardiomyopathy) (HCC) 11/22/2015   a.) TTE 11/22/2015: EF 40%. b.) TTE 12/27/2018: EF 45%. c.) TTE 08/30/2020: EF 45-50%.   Night sweats    Obesity    Osteoarthritis    Pulmonary emphysema (HCC)    RBBB (right bundle branch block)    Right inguinal hernia    Right renal mass 04/24/2022   a.) CT A/P 04/24/22: 2.7 x 2.8 cm enhancing septation lateral aspect RIGHT kidney concerning for cystic renal neoplasm; b.) CT chest 05/15/22: 3 cm RIGHT renal mass; c.) CT A/P 08/17/22: 2.9 x 2.6 cm exophytic mass; d.) CT A/P 11/19/22: 3.0 x 2.6 cm solid & cystic mass peripheral superior RIGHT renal pole c/w RCC (Bosniak IV); e.) CT A/P 03/30/23: 3.1 x 2.6 cm; f.) CT A/P 07/21/23: 3.3 x 2.9 cm   Squamous cell lung cancer (HCC) 04/24/2022   a.)  CT chest 04/24/2022: Multilobulated perihilar mass measuring 5.3 x 3.7 x 2.8 cm with associated LEFT hilar and  mediastinal LAD; b.)  PET CT 05/07/2022: Hypermetabolic LEFT upper lobe lung mass (max SUV 15.1); ipsilateral nodal metastasis --> presuming a NSC histology, consistent with stage IIIb pulmonary neoplasm (T3 N2 M0) --> path (+) NSCLC (SCC)   Thoracic spondylosis    VHD (valvular heart disease) 11/22/2015   a.) TTE 11/22/2015: EF 40%; mod MR, mod-sev TR, triv PR; mod BAE, mild RV enlargement. b.) TTE 12/27/2018: EF 45%; mod MR, mod-sev TR, triv PR; mod BAE, mild RV enlargement. c.) TTE 08/30/2020: EF 45-50%; mild MR/TR.    Past Surgical History:  Procedure Laterality Date   Bilateral  Radical Keratotomy Bilateral 1997   COLONOSCOPY WITH PROPOFOL N/A 01/11/2016   Procedure: COLONOSCOPY WITH PROPOFOL;  Surgeon: Casey Jun, MD;  Location: [facility] ENDOSCOPY;  Service: Endoscopy;  Laterality: N/A; repeat 3 years, tubular adenoma   DENTAL SURGERY     Patient had implants   FLEXIBLE BRONCHOSCOPY Left 05/19/2022   Procedure: FLEXIBLE BRONCHOSCOPY;  Surgeon: Casey Saner, MD;  Location: [facility] ORS;  Service: Cardiopulmonary;  Laterality: Left;   GUM SURGERY     IR IMAGING GUIDED PORT INSERTION  06/13/2022   IR RADIOLOGIST EVAL & MGMT  10/06/2023   IR RADIOLOGIST EVAL & MGMT  11/19/2023   L4 - S1 decompression Left    PROSTATE BIOPSY     TONSILLECTOMY AND ADENOIDECTOMY  age 59   VIDEO BRONCHOSCOPY WITH ENDOBRONCHIAL ULTRASOUND Left 05/19/2022   Procedure: VIDEO BRONCHOSCOPY WITH ENDOBRONCHIAL ULTRASOUND;  Surgeon: Casey Saner, MD;  Location: [facility] ORS;  Service: Cardiopulmonary;  Laterality: Left;   XI ROBOTIC ASSISTED INGUINAL HERNIA REPAIR WITH MESH Right 10/21/2021   Procedure: XI ROBOTIC ASSISTED INGUINAL HERNIA REPAIR WITH MESH;  Surgeon: Casey Lerner, MD;  Location: [facility] ORS;  Service: General;  Laterality: Right;    Allergies: Cefdinir, Doxycycline, Prednisone, and Sertraline  Medications: Prior to Admission medications   Medication Sig Start Date End Date Taking? Authorizing Provider  amLODipine (NORVASC) 5 MG tablet TAKE 1 TABLET (5 MG TOTAL) BY MOUTH DAILY. 09/19/22  Yes Gallegos, Makiera, MD  benazepril (LOTENSIN) 40 MG tablet Take 1 tablet (40 mg total) by mouth daily. 12/22/22  Yes Gallegos, Makiera, MD  carvedilol (COREG) 6.25 MG tablet TAKE 1 TABLET (6.25 MG TOTAL) BY MOUTH 2 (TWO) TIMES DAILY. 09/19/22  Yes Gallegos, Makiera, MD  Cyanocobalamin (VITAMIN B 12 PO) Take 1 capsule by mouth 2 (two) times daily. 1000 mcg each capsule   Yes [provider]  doxazosin (CARDURA) 8 MG tablet Take 8 mg by mouth at bedtime.   Yes  [provider]  finasteride (PROSCAR) 5 MG tablet TAKE 1 TABLET EVERY DAY 02/12/23  Yes Gallegos, Casey Herter A, PA-C  gabapentin (NEURONTIN) 300 MG capsule TAKE 1 CAPSULE EVERY MORNING, 1 CAPSULE IN THE AFTERNOON AND 2 CAPSULES AT BEDTIME Patient taking differently: Take 300 mg by mouth 2 (two) times daily. 04/08/23  Yes Gallegos, Makiera, MD  Magnesium 500 MG TABS Take 500 mg by mouth daily.   Yes [provider]  Multiple Vitamin (MULTI-VITAMIN) tablet Take 1 tablet by mouth daily.    Yes [provider]  oxyCODONE (OXY IR/ROXICODONE) 5 MG immediate release tablet Take 1 tablet (5 mg total) by mouth every 6 (six) hours as needed for severe pain. 12/03/22  Yes Gallegos, Makiera, MD  triamterene-hydrochlorothiazide (MAXZIDE-25) 37.5-25 MG tablet TAKE 1/2 TABLET EVERY DAY 03/05/23  Yes Gallegos, Makiera, MD  warfarin (COUMADIN) 5 MG tablet TAKE 1 TABLET EVERY DAY Patient taking differently: Take  5 mg by mouth daily. Thursday 2.5 mg; all other days 5 mg 03/30/23  Yes Gallegos, Makiera, MD     Family History  Problem Relation Age of Onset   Cancer Mother        secondary to cancinoma of the jaw from her dipping snuff.   Heart attack Father    CAD Father    Hypertension Sister    Diabetes Son        Type I diabetes   Prostate cancer Neg Hx    Kidney cancer Neg Hx     Social History   Socioeconomic History   Marital status: Married    Spouse name: Doris   Number of children: 3   Years of education: Not on file   Highest education level: Associate degree: occupational, Scientist, product/process development, or vocational program  Occupational History   Occupation: retired  Tobacco Use   Smoking status: Former    Current packs/day: 0.00    Types: Cigarettes    Quit date: 08/26/1971    Years since quitting: 52.2    Passive exposure: Never   Smokeless tobacco: Never   Tobacco comments:    Smoked for about 20 years.  Vaping Use   Vaping status: Never Used   Substance and Sexual Activity   Alcohol use: Not Currently   Drug use: No   Sexual activity: Not Currently  Other Topics Concern   Not on file  Social History Narrative   Not on file   Social Drivers of Health   Financial Resource Strain: Low Risk  (03/20/2023)   Received from [facility]   Overall Financial Resource Strain (CARDIA)    Difficulty of Paying Living Expenses: Not very hard  Food Insecurity: No Food Insecurity (03/20/2023)   Received from [facility]   Hunger Vital Sign    Worried About Running Out of Food in the Last Year: Never true    Ran Out of Food in the Last Year: Never true  Transportation Needs: No Transportation Needs (03/20/2023)   Received from [facility] - Transportation    In the past 12 months, has lack of transportation kept you from medical appointments or from getting medications?: No    Lack of Transportation (Non-Medical): No  Physical Activity: Sufficiently Active (12/23/2022)   Exercise Vital Sign    Days of Exercise per Week: 7 days    Minutes of Exercise per Session: 50 min  Stress: No Stress Concern Present (12/18/2021)   [employer] of Occupational Health - Occupational Stress Questionnaire    Feeling of Stress : Only a little  Social Connections: Moderately Integrated (12/23/2022)   Social Connection and Isolation Panel [NHANES]    Frequency of Communication with Friends and Family: Twice a week    Frequency of Social Gatherings with Friends and Family: Once a week    Attends Religious Services: More than 4 times per year    Active Member of [employer] or Organizations: No    Attends Banker Meetings: Never    Marital Status: Married    ECOG Status: 0 - Asymptomatic  Review of Systems: A 12 point ROS discussed and pertinent positives are indicated in the HPI above.  All other systems are negative.  Review of Systems  Vital Signs: BP 134/86   Pulse 65    Temp 97.7 F (36.5 C)   Resp 14   SpO2 97%   Advance Care Plan: The advanced care plan/surrogate  decision maker was discussed at the time of visit and the patient did not wish to discuss or was not able to name a surrogate decision maker or provide an advance care plan.    Physical Exam Constitutional:      General: He is not in acute distress.    Appearance: Normal appearance. He is normal weight.  HENT:     Head: Normocephalic and atraumatic.  Eyes:     General: No scleral icterus. Cardiovascular:     Rate and Rhythm: Normal rate.  Pulmonary:     Effort: Pulmonary effort is normal.  Abdominal:     General: Abdomen is flat. There is no distension.     Palpations: Abdomen is soft.     Tenderness: There is no abdominal tenderness.     Comments: Skin incisions completely healed  Skin:    General: Skin is warm and dry.  Neurological:     Mental Status: He is alert and oriented to person, place, and time.  Psychiatric:        Mood and Affect: Mood normal.        Behavior: Behavior normal.       Imaging: IR Radiologist Eval & Mgmt Result Date: 11/19/2023 EXAM: NEW PATIENT OFFICE VISIT CHIEF COMPLAINT: SEE NOTE IN EPIC HISTORY OF PRESENT ILLNESS: SEE NOTE IN EPIC REVIEW OF SYSTEMS: SEE NOTE IN EPIC PHYSICAL EXAMINATION: SEE NOTE IN EPIC ASSESSMENT AND PLAN: SEE NOTE IN EPIC Electronically Signed   By: Malachy Moan M.D.   On: 11/19/2023 10:45   CT RENAL TUMOR ABLATION UNILATERAL Result Date: 11/02/2023 INDICATION: 89 year old male with an enlarging enhancing cystic lesion arising from the upper pole of the right kidney. He is a poor operative candidate due to advanced age and medical comorbidities but is an excellent candidate for percutaneous ablation. He presents for simultaneous microwave ablation and CT-guided biopsy. EXAM: 1. CT-guided core biopsy of renal lesion 2. CT-guided percutaneous ablation of right renal lesion COMPARISON:  None Available. MEDICATIONS: None.  ANESTHESIA/SEDATION: General - as administered by the Anesthesia department FLUOROSCOPY: None. COMPLICATIONS: None immediate. TECHNIQUE: Informed written consent was obtained from the patient after a thorough discussion of the procedural risks, benefits and alternatives. All questions were addressed. Maximal Sterile Barrier Technique was utilized including caps, mask, sterile gowns, sterile gloves, sterile drape, hand hygiene and skin antiseptic. A timeout was performed prior to the initiation of the procedure. A planning CT scan was performed. The exophytic lesion from the lateral aspect of the upper pole of the right kidney was successfully identified. To prevent inadvertent transgression of the lung, the CT gantry was tilted by 15 degrees caudally to allow for a trajectory beginning lower and coming up under the posterior aspect of the lung. Additionally, the lesion abuts the liver surface anteriorly. Therefore, we will plan for hydro dissection to create above for between the mass and the liver to prevent non target heating of the liver capsule. A suitable skin entry sites for antenna probe placement and hydro dissection needle placement were identified and marked. The skin was sterilely prepped and draped in the standard fashion using chlorhexidine skin prep. Small dermatotomies were made. 22 gauge spinal needles were advanced along the planned trajectories in imaging obtained to ensure appropriate trajectory planning. The hydrodissection needle was carefully navigated into the anterior pararenal space between the liver and renal mass. Hydro dissection was then performed using saline. A 15 cm NeuWave PR XT probe was then selected and advanced through the right paraspinal  musculature and ultimately through the lesion. Additional axial CT imaging was performed confirming antenna placement and preservation of the lung base. A 17 gauge introducer needle was also advanced under imaging guidance and positioned at the  margin the mass. A single 2 cm 18 gauge core biopsy was obtained through the introducer needle using the bio Pince automated biopsy device. The specimens placed in formalin and delivered to pathology for further evaluation. Percutaneous thermal ablation was then performed. The antenna was powered at 65 watts and ablation maintained for 6 minutes. Imaging was performed during the ablation without contrast and following the ablation with intravenous contrast. The ablation zone appears to encompass the entirety of the lesion. No evidence of hemorrhage or other complication. The patient tolerated the procedure well. FINDINGS: Database administrator. IMPRESSION: 1. CT-guided core biopsy of right upper pole renal mass. 2. CT-guided percutaneous ablation of right upper pole renal mass. Electronically Signed   By: Malachy Moan M.D.   On: 11/02/2023 12:43   CT RENAL BIOPSY Result Date: 11/02/2023 INDICATION: 89 year old male with an enlarging enhancing cystic lesion arising from the upper pole of the right kidney. He is a poor operative candidate due to advanced age and medical comorbidities but is an excellent candidate for percutaneous ablation. He presents for simultaneous microwave ablation and CT-guided biopsy. EXAM: 1. CT-guided core biopsy of renal lesion 2. CT-guided percutaneous ablation of right renal lesion COMPARISON:  None Available. MEDICATIONS: None. ANESTHESIA/SEDATION: General - as administered by the Anesthesia department FLUOROSCOPY: None. COMPLICATIONS: None immediate. TECHNIQUE: Informed written consent was obtained from the patient after a thorough discussion of the procedural risks, benefits and alternatives. All questions were addressed. Maximal Sterile Barrier Technique was utilized including caps, mask, sterile gowns, sterile gloves, sterile drape, hand hygiene and skin antiseptic. A timeout was performed prior to the initiation of the procedure. A planning CT scan was performed. The exophytic  lesion from the lateral aspect of the upper pole of the right kidney was successfully identified. To prevent inadvertent transgression of the lung, the CT gantry was tilted by 15 degrees caudally to allow for a trajectory beginning lower and coming up under the posterior aspect of the lung. Additionally, the lesion abuts the liver surface anteriorly. Therefore, we will plan for hydro dissection to create above for between the mass and the liver to prevent non target heating of the liver capsule. A suitable skin entry sites for antenna probe placement and hydro dissection needle placement were identified and marked. The skin was sterilely prepped and draped in the standard fashion using chlorhexidine skin prep. Small dermatotomies were made. 22 gauge spinal needles were advanced along the planned trajectories in imaging obtained to ensure appropriate trajectory planning. The hydrodissection needle was carefully navigated into the anterior pararenal space between the liver and renal mass. Hydro dissection was then performed using saline. A 15 cm NeuWave PR XT probe was then selected and advanced through the right paraspinal musculature and ultimately through the lesion. Additional axial CT imaging was performed confirming antenna placement and preservation of the lung base. A 17 gauge introducer needle was also advanced under imaging guidance and positioned at the margin the mass. A single 2 cm 18 gauge core biopsy was obtained through the introducer needle using the bio Pince automated biopsy device. The specimens placed in formalin and delivered to pathology for further evaluation. Percutaneous thermal ablation was then performed. The antenna was powered at 65 watts and ablation maintained for 6 minutes. Imaging was performed during the ablation  without contrast and following the ablation with intravenous contrast. The ablation zone appears to encompass the entirety of the lesion. No evidence of hemorrhage or other  complication. The patient tolerated the procedure well. FINDINGS: Database administrator. IMPRESSION: 1. CT-guided core biopsy of right upper pole renal mass. 2. CT-guided percutaneous ablation of right upper pole renal mass. Electronically Signed   By: Malachy Moan M.D.   On: 11/02/2023 12:43    Labs:  CBC: Recent Labs    06/12/23 0907 07/10/23 0844 08/07/23 0954 11/02/23 0744  WBC 4.1 3.6* 4.4 3.8*  HGB 10.8* 10.3* 10.5* 11.6*  HCT 32.0* 31.1* 31.4* 35.2*  PLT 121* 109* 153 117*    COAGS: Recent Labs    02/23/23 0922 03/11/23 1425 11/02/23 0744  INR 1.9*  1.9 2.3  2.3 1.3*    BMP: Recent Labs    07/10/23 0844 08/07/23 0954 10/21/23 1022 11/02/23 0744  NA 141 140 140 141  K 3.7 4.3 4.1 3.6  CL 111 110 108 109  CO2 24 25 27 26   GLUCOSE 132* 99 83 109*  BUN 25* 25* 33* 23  CALCIUM 8.6* 8.6* 8.6* 8.8*  CREATININE 1.19 0.99 1.16 1.06  GFRNONAA 59* >60 >60 >60    LIVER FUNCTION TESTS: Recent Labs    05/15/23 1007 06/12/23 0907 07/10/23 0844 08/07/23 0954  BILITOT 1.2 1.0 0.9 0.5  AST 18 22 20 17   ALT 16 17 16 14   ALKPHOS 51 58 58 55  PROT 5.7* 6.0* 6.0* 5.9*  ALBUMIN 3.3* 3.6 3.5 3.4*    TUMOR MARKERS: No results for input(s): "AFPTM", "CEA", "CA199", "CHROMGRNA" in the last 8760 hours.  Assessment and Plan:  Very pleasant 89 year old gentleman now 2 weeks status post biopsy and percutaneous thermal ablation of right upper pole renal mass.  Biopsy came back positive for a low-grade oncocytic tumor (LOT).  He has completely recovered from his procedure and has had no adverse events or symptoms.  We will continue with routine surveillance imaging.  1.)  Renal protocol CT scan of the abdomen with and without contrast followed by a clinic visit in 6 months.  Dr. Smith Robert is ordering CT scans as well for follow-up of his lung cancer, we do not need to duplicate CT imaging if he has CT scans of the chest, abdomen and pelvis already scheduled for a similar time  frame.    Electronically Signed: Sterling Big 11/19/2023, 11:21 AM   I spent a total of  15 Minutes in face to face in clinical consultation, greater than 50% of which was counseling/coordinating care for right renal neoplasm (low-grade oncocytic neoplasm)

## 2023-12-04 DIAGNOSIS — Z7901 Long term (current) use of anticoagulants: Secondary | ICD-10-CM | POA: Diagnosis not present

## 2023-12-04 DIAGNOSIS — I482 Chronic atrial fibrillation, unspecified: Secondary | ICD-10-CM | POA: Diagnosis not present

## 2024-01-01 DIAGNOSIS — Z7901 Long term (current) use of anticoagulants: Secondary | ICD-10-CM | POA: Diagnosis not present

## 2024-01-01 DIAGNOSIS — I482 Chronic atrial fibrillation, unspecified: Secondary | ICD-10-CM | POA: Diagnosis not present

## 2024-01-12 ENCOUNTER — Encounter: Payer: Self-pay | Admitting: Dermatology

## 2024-01-12 ENCOUNTER — Encounter (INDEPENDENT_AMBULATORY_CARE_PROVIDER_SITE_OTHER): Payer: Self-pay

## 2024-01-12 ENCOUNTER — Ambulatory Visit: Payer: Medicare HMO | Admitting: Dermatology

## 2024-01-12 DIAGNOSIS — L820 Inflamed seborrheic keratosis: Secondary | ICD-10-CM

## 2024-01-12 DIAGNOSIS — Z1283 Encounter for screening for malignant neoplasm of skin: Secondary | ICD-10-CM

## 2024-01-12 DIAGNOSIS — D225 Melanocytic nevi of trunk: Secondary | ICD-10-CM | POA: Diagnosis not present

## 2024-01-12 DIAGNOSIS — L578 Other skin changes due to chronic exposure to nonionizing radiation: Secondary | ICD-10-CM

## 2024-01-12 DIAGNOSIS — L910 Hypertrophic scar: Secondary | ICD-10-CM

## 2024-01-12 DIAGNOSIS — M793 Panniculitis, unspecified: Secondary | ICD-10-CM | POA: Diagnosis not present

## 2024-01-12 DIAGNOSIS — R234 Changes in skin texture: Secondary | ICD-10-CM | POA: Diagnosis not present

## 2024-01-12 DIAGNOSIS — D692 Other nonthrombocytopenic purpura: Secondary | ICD-10-CM | POA: Diagnosis not present

## 2024-01-12 DIAGNOSIS — L821 Other seborrheic keratosis: Secondary | ICD-10-CM | POA: Diagnosis not present

## 2024-01-12 DIAGNOSIS — D2239 Melanocytic nevi of other parts of face: Secondary | ICD-10-CM

## 2024-01-12 DIAGNOSIS — L814 Other melanin hyperpigmentation: Secondary | ICD-10-CM

## 2024-01-12 DIAGNOSIS — D229 Melanocytic nevi, unspecified: Secondary | ICD-10-CM

## 2024-01-12 DIAGNOSIS — W908XXA Exposure to other nonionizing radiation, initial encounter: Secondary | ICD-10-CM | POA: Diagnosis not present

## 2024-01-12 DIAGNOSIS — D1801 Hemangioma of skin and subcutaneous tissue: Secondary | ICD-10-CM

## 2024-01-12 DIAGNOSIS — L89151 Pressure ulcer of sacral region, stage 1: Secondary | ICD-10-CM

## 2024-01-12 NOTE — Patient Instructions (Addendum)
 Start Opzelura cream once or twice daily to affected area at buttocks.   Apply Desitin over Opzelura.   Use Donut pillow when sitting.       Cryotherapy Aftercare  Wash gently with soap and water everyday.   Apply Vaseline Jelly daily until healed.     Recommend daily broad spectrum sunscreen SPF 30+ to sun-exposed areas, reapply every 2 hours as needed. Call for new or changing lesions.  Staying in the shade or wearing long sleeves, sun glasses (UVA+UVB protection) and wide brim hats (4-inch brim around the entire circumference of the hat) are also recommended for sun protection.      Due to recent changes in healthcare laws, you may see results of your pathology and/or laboratory studies on MyChart before the doctors have had a chance to review them. We understand that in some cases there may be results that are confusing or concerning to you. Please understand that not all results are received at the same time and often the doctors may need to interpret multiple results in order to provide you with the best plan of care or course of treatment. Therefore, we ask that you please give us  2 business days to thoroughly review all your results before contacting the office for clarification. Should we see a critical lab result, you will be contacted sooner.   If You Need Anything After Your Visit  If you have any questions or concerns for your doctor, please call our main line at [phone] and press option 4 to reach your doctor's medical assistant. If no one answers, please leave a voicemail as directed and we will return your call as soon as possible. Messages left after 4 pm will be answered the following business day.   You may also send us  a message via MyChart. We typically respond to MyChart messages within 1-2 business days.  For prescription refills, please ask your pharmacy to contact our office. Our fax number is [phone].  If you have an urgent issue when the clinic is  closed that cannot wait until the next business day, you can page your doctor at the number below.    Please note that while we do our best to be available for urgent issues outside of office hours, we are not available 24/7.   If you have an urgent issue and are unable to reach us , you may choose to seek medical care at your doctor's office, retail clinic, urgent care center, or emergency room.  If you have a medical emergency, please immediately call 911 or go to the emergency department.  Pager Numbers  - Dr. Bary Likes: [phone]  - Dr. Annette Barters: [phone]  - Dr. Felipe Horton: [phone]   In the event of inclement weather, please call our main line at [phone] for an update on the status of any delays or closures.  Dermatology Medication Tips: Please keep the boxes that topical medications come in in order to help keep track of the instructions about where and how to use these. Pharmacies typically print the medication instructions only on the boxes and not directly on the medication tubes.   If your medication is too expensive, please contact our office at [phone] option 4 or send us  a message through MyChart.   We are unable to tell what your co-pay for medications will be in advance as this is different depending on your insurance coverage. However, we may be able to find a substitute medication at lower cost or fill out paperwork to get  insurance to cover a needed medication.   If a prior authorization is required to get your medication covered by your insurance company, please allow us  1-2 business days to complete this process.  Drug prices often vary depending on where the prescription is filled and some pharmacies may offer cheaper prices.  The website www.goodrx.com contains coupons for medications through different pharmacies. The prices here do not account for what the cost may be with help from insurance (it may be cheaper with your insurance), but the website can  give you the price if you did not use any insurance.  - You can print the associated coupon and take it with your prescription to the pharmacy.  - You may also stop by our office during regular business hours and pick up a GoodRx coupon card.  - If you need your prescription sent electronically to a different pharmacy, notify our office through [facility] or by phone at [phone] option 4.     Si Usted Necesita Algo Despus de Su Visita  Tambin puede enviarnos un mensaje a travs de Clinical cytogeneticist. Por lo general respondemos a los mensajes de MyChart en el transcurso de 1 a 2 das hbiles.  Para renovar recetas, por favor pida a su farmacia que se ponga en contacto con nuestra oficina. Franz Jacks de fax es [place] [phone].  Si tiene un asunto urgente cuando la clnica est cerrada y que no puede esperar hasta el siguiente da hbil, puede llamar/localizar a su doctor(a) al nmero que aparece a continuacin.   Por favor, tenga en cuenta que aunque hacemos todo lo posible para estar disponibles para asuntos urgentes fuera del horario de [place], no estamos disponibles las 24 horas del da, los 7 [address]  [address] de la [place].   Si tiene un problema urgente y no puede comunicarse con nosotros, puede optar por buscar atencin mdica  en el consultorio de su doctor(a), en una clnica privada, en un centro de atencin urgente o en una sala de emergencias.  Si tiene Engineer, drilling, por favor llame inmediatamente al 911 o vaya a la sala de emergencias.  Nmeros de bper  - Dr. Bary Likes: [phone]  - Dra. Annette Barters: [phone]  - Dr. Felipe Horton: [phone]   En caso de inclemencias del tiempo, por favor llame a Lajuan Pila principal al [phone] para una actualizacin sobre el [place] de cualquier retraso o cierre.  Consejos para la medicacin en dermatologa: Por favor, guarde las cajas en las que vienen los medicamentos de uso tpico para ayudarle a seguir las instrucciones sobre  dnde y cmo usarlos. Las farmacias generalmente imprimen las instrucciones del medicamento slo en las cajas y no directamente en los tubos del [place].   Si su medicamento es muy caro, por favor, pngase en contacto con Bettyjane Brunet llamando al [phone] y presione la opcin 4 o envenos un mensaje a travs de Clinical cytogeneticist.   No podemos decirle cul ser su copago por los medicamentos por adelantado ya que esto es diferente dependiendo de la cobertura de su seguro. Sin embargo, es posible que podamos encontrar un medicamento sustituto a Audiological scientist un formulario para que el seguro cubra el medicamento que se considera necesario.   Si se requiere una autorizacin previa para que su compaa de seguros [place] su medicamento, por favor permtanos de 1 a 2 das hbiles para completar este proceso.  Los precios de los medicamentos varan con frecuencia dependiendo del Environmental consultant de dnde se surte la receta y Careers adviser pueden  ofrecer precios ms baratos.  El sitio web www.goodrx.com tiene cupones para medicamentos de Health and safety inspector. Los precios aqu no tienen en cuenta lo que podra costar con la ayuda del seguro (puede ser ms barato con su seguro), pero el sitio web puede darle el precio si no utiliz Tourist information centre manager.  - Puede imprimir el cupn correspondiente y llevarlo con su receta a la farmacia.  - Tambin puede pasar por nuestra oficina durante el horario de atencin regular y Education officer, museum una tarjeta de cupones de GoodRx.  - Si necesita que su receta se enve electrnicamente a una farmacia diferente, informe a nuestra oficina a travs de MyChart de [place] o por telfono llamando al [phone] y presione la opcin 4.

## 2024-01-12 NOTE — Progress Notes (Signed)
 Follow-Up Visit   Subjective  Casey Gallegos is a 89 y.o. male who presents for the following: Skin Cancer Screening and Upper Body Skin Exam. Hx of AKs. No personal Hx of skin cancer or dysplastic nevi. Recheck AKs Tx at last visit. Left and right post auricular neck. 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 Is no longer using triamcinolone  or Tacrolimus  (cost $115).  Started using [employer] brand pain relief balm. He states that cured his leg. Balm has camphor and menthol.   6 month follow up for [facility] at gluteal cleft. Used Tacrolimus , has helped some but still having trouble. No longer using Tacrolimus . Uses cushion to sit, uses Desitin. Painful. Denies itching.   The patient presents for Upper Body Skin Exam (UBSE) for skin cancer screening and mole check. The patient has spots, moles and lesions to be evaluated, some may be new or changing and the patient may have concern these could be cancer.    The following portions of the chart were reviewed this encounter and updated as appropriate: medications, allergies, medical history  Review of Systems:  No other skin or systemic complaints except as noted in HPI or Assessment and Plan.  Objective  Well appearing patient in no apparent distress; mood and affect are within normal limits.  All skin waist up and right lower leg examined. Relevant physical exam findings are noted in the Assessment and Plan.  Neck - Posterior x4 (4) Excoriated firm keratotic papules  Assessment & Plan   INFLAMED SEBORRHEIC KERATOSIS (4) Neck - Posterior x4 (4) Probable ISKs  Symptomatic, irritating, patient would like treated. Destruction of lesion - Neck - Posterior x4 (4)  Destruction method: cryotherapy   Informed consent: discussed and consent obtained   Lesion destroyed using liquid nitrogen: Yes   Region frozen until ice ball extended beyond lesion: Yes   Outcome:  patient tolerated procedure well with no complications   Post-procedure details: wound care instructions given   Additional details:  Prior to procedure, discussed risks of blister formation, small wound, skin dyspigmentation, or rare scar following cryotherapy. Recommend Vaseline ointment to treated areas while healing.   Skin cancer screening performed today.  Actinic Damage - Chronic condition, secondary to cumulative UV/sun exposure - diffuse scaly erythematous macules with underlying dyspigmentation - Recommend daily broad spectrum sunscreen SPF 30+ to sun-exposed areas, reapply every 2 hours as needed.  - Staying in the shade or wearing long sleeves, sun glasses (UVA+UVB protection) and wide brim hats (4-inch brim around the entire circumference of the hat) are also recommended for sun protection.  - Call for new or changing lesions.  Lentigines, Seborrheic Keratoses, Hemangiomas - Benign normal skin lesions. Face, neck, torso, arms. - Benign-appearing - Call for any changes  Melanocytic Nevi - Tan-brown and/or pink-flesh-colored symmetric macules and papules - 7.45mm flesh papule with red macule c/w telangiectasia inferior to nevus at right medial cheek at nasolabial.  - 4 x 3 mm med dark brown macule with notch at right lateral upper back. - Benign appearing on exam today - Observation - Call clinic for new or changing moles - Recommend daily use of broad spectrum spf 30+ sunscreen to sun-exposed areas.    LIPODERMATOSCLEROSIS R lateral lower leg Exam: Firm hyperpigmented violaceous plaque at right lower leg, non tender   Chronic and persistent condition with duration or expected duration over one year. Condition is improving with treatment but not  currently at goal.   Counseling: Lipodermatosclerosis is a chronic inflammatory condition of unknown cause of the subcutaneous fat causing tenderness, discoloration and hardening of the involved skin, most commonly on the lower  legs.  It may progress and gradually worsen over time, especially in the setting of chronic leg swelling. Daily compression stockings/hose is recommended.  Topical and intralesional steroids; Therapeutic Ultrasound and some systemic medications may be helpful.  Consultation with Vascular specialists may yield other options.      Treatment Plan: Cont Compression socks daily Okay to use [employer] pain relief balm as needed   HYPERTROPHIC SCAR Exam: Firm smooth flesh papule at right thenar hand, pt states he had h/o recent trauma to area  Benign-appearing.  Call clinic for new or changing lesions. Discussed option of intralesional triamcinolone  if this becomes bothersome.   Treatment Plan: Observe. Recheck on f/up  Purpura - Chronic; persistent and recurrent.  Treatable, but not curable. 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 Condition is symptomatic/ bothersome to patient. Not currently at goal. Tacrolimus  not helping.   Lichen simplex chronicus [name] is a persistent itchy area of thickened skin that is induced by chronic rubbing and/or scratching (chronic dermatitis).  These areas may be pink, hyperpigmented and may have excoriations and bumps (prurigo nodules- PN).  LSC/PN is commonly observed in uncontrolled atopic dermatitis and other forms of eczema, and in other itchy skin conditions (eg, insect bites, scabies).  Sometimes it is not possible to know initial cause of LSC/PN if it has been present for a long time.  It generally responds  well to treatment with high potency topical steroids.  It is important to stop rubbing/scratching the area in order to break the itch-scratch-rash-itch cycle, in order for the rash to resolve.   Treatment Plan: Start Opzelura cream once or twice daily to affected area at buttocks.  Samples x3 given.  Lot: [id number] Exp: 10/22/2024  Apply Desitin over Opzelura.   Use Donut pillow when sitting.   Avoid picking/rubbing/scratching   Return in about 6 months (around 07/14/2024) for AK Follow Up.  I, Darcie Easterly, CMA, am acting as scribe for Artemio Larry, MD.   Documentation: I have reviewed the above documentation for accuracy and completeness, and I agree with the above.  Artemio Larry, MD

## 2024-01-29 DIAGNOSIS — Z7901 Long term (current) use of anticoagulants: Secondary | ICD-10-CM | POA: Diagnosis not present

## 2024-01-29 DIAGNOSIS — I482 Chronic atrial fibrillation, unspecified: Secondary | ICD-10-CM | POA: Diagnosis not present

## 2024-02-02 DIAGNOSIS — I482 Chronic atrial fibrillation, unspecified: Secondary | ICD-10-CM | POA: Diagnosis not present

## 2024-02-02 DIAGNOSIS — C3412 Malignant neoplasm of upper lobe, left bronchus or lung: Secondary | ICD-10-CM | POA: Diagnosis not present

## 2024-02-02 DIAGNOSIS — Z1331 Encounter for screening for depression: Secondary | ICD-10-CM | POA: Diagnosis not present

## 2024-02-02 DIAGNOSIS — I420 Dilated cardiomyopathy: Secondary | ICD-10-CM | POA: Diagnosis not present

## 2024-02-02 DIAGNOSIS — I872 Venous insufficiency (chronic) (peripheral): Secondary | ICD-10-CM | POA: Diagnosis not present

## 2024-02-02 DIAGNOSIS — Z0000 Encounter for general adult medical examination without abnormal findings: Secondary | ICD-10-CM | POA: Diagnosis not present

## 2024-02-05 ENCOUNTER — Inpatient Hospital Stay: Payer: HMO | Attending: Oncology

## 2024-02-05 DIAGNOSIS — Z452 Encounter for adjustment and management of vascular access device: Secondary | ICD-10-CM | POA: Diagnosis not present

## 2024-02-05 DIAGNOSIS — C3412 Malignant neoplasm of upper lobe, left bronchus or lung: Secondary | ICD-10-CM | POA: Diagnosis not present

## 2024-02-05 DIAGNOSIS — Z95828 Presence of other vascular implants and grafts: Secondary | ICD-10-CM

## 2024-02-05 MED ORDER — SODIUM CHLORIDE 0.9% FLUSH
10.0000 mL | Freq: Once | INTRAVENOUS | Status: AC
  Administered 2024-02-05: 10 mL via INTRAVENOUS
  Filled 2024-02-05: qty 10

## 2024-02-05 MED ORDER — HEPARIN SOD (PORK) LOCK FLUSH 100 UNIT/ML IV SOLN
500.0000 [IU] | Freq: Once | INTRAVENOUS | Status: AC
  Administered 2024-02-05: 500 [IU] via INTRAVENOUS
  Filled 2024-02-05: qty 5

## 2024-02-08 ENCOUNTER — Ambulatory Visit: Payer: Medicare HMO | Admitting: Radiation Oncology

## 2024-02-12 ENCOUNTER — Ambulatory Visit
Admission: RE | Admit: 2024-02-12 | Discharge: 2024-02-12 | Disposition: A | Discharge status: Home / Self Care | Source: Ambulatory Visit | Attending: Oncology | Admitting: Oncology

## 2024-02-12 DIAGNOSIS — I700 Atherosclerosis of aorta: Secondary | ICD-10-CM | POA: Diagnosis not present

## 2024-02-12 DIAGNOSIS — N400 Enlarged prostate without lower urinary tract symptoms: Secondary | ICD-10-CM | POA: Diagnosis not present

## 2024-02-12 DIAGNOSIS — C3412 Malignant neoplasm of upper lobe, left bronchus or lung: Secondary | ICD-10-CM | POA: Diagnosis not present

## 2024-02-12 DIAGNOSIS — N2889 Other specified disorders of kidney and ureter: Secondary | ICD-10-CM | POA: Diagnosis not present

## 2024-02-12 MED ORDER — IOHEXOL 300 MG/ML  SOLN
85.0000 mL | Freq: Once | INTRAMUSCULAR | Status: AC | PRN
  Administered 2024-02-12: 85 mL via INTRAVENOUS

## 2024-02-15 ENCOUNTER — Other Ambulatory Visit: Payer: Self-pay | Admitting: *Deleted

## 2024-02-15 ENCOUNTER — Ambulatory Visit
Admission: RE | Admit: 2024-02-15 | Discharge: 2024-02-15 | Disposition: A | Discharge status: Home / Self Care | Payer: Self-pay | Source: Ambulatory Visit | Attending: Radiation Oncology | Admitting: Radiation Oncology

## 2024-02-15 ENCOUNTER — Encounter: Payer: Self-pay | Admitting: Radiation Oncology

## 2024-02-15 VITALS — BP 138/82 | HR 84 | Temp 98.2°F | Resp 16 | Wt 148.0 lb

## 2024-02-15 DIAGNOSIS — R918 Other nonspecific abnormal finding of lung field: Secondary | ICD-10-CM

## 2024-02-15 DIAGNOSIS — C3412 Malignant neoplasm of upper lobe, left bronchus or lung: Secondary | ICD-10-CM | POA: Insufficient documentation

## 2024-02-15 DIAGNOSIS — Z923 Personal history of irradiation: Secondary | ICD-10-CM | POA: Insufficient documentation

## 2024-02-15 DIAGNOSIS — R1310 Dysphagia, unspecified: Secondary | ICD-10-CM | POA: Insufficient documentation

## 2024-02-15 DIAGNOSIS — Z9221 Personal history of antineoplastic chemotherapy: Secondary | ICD-10-CM | POA: Insufficient documentation

## 2024-02-15 DIAGNOSIS — Z87891 Personal history of nicotine dependence: Secondary | ICD-10-CM | POA: Diagnosis not present

## 2024-02-15 NOTE — Progress Notes (Signed)
 Radiation Oncology Follow up Note  Name: Casey Gallegos   Date:   02/15/2024 MRN:  [id number] DOB: [birth year]    This 89 y.o. 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  I looked at his area of the throat really do not see anything discernible although I am recommending an ENT evaluation  COMPLICATIONS OF TREATMENT: none  FOLLOW UP COMPLIANCE: keeps appointments   PHYSICAL EXAM:  BP 138/82   Pulse 84   Temp 98.2 F (36.8 C) (Tympanic)   Resp 16   Wt 148 lb (67.1 kg)   BMI 21.86 kg/m  Well-developed well-nourished patient in NAD. HEENT reveals PERLA, EOMI, discs not visualized.  Oral cavity is clear. No oral mucosal lesions are identified. Neck is clear without evidence of cervical or supraclavicular adenopathy. Lungs are clear to A&P. Cardiac examination is essentially unremarkable with regular rate and rhythm without murmur rub or thrill. Abdomen is benign with no organomegaly or masses noted. Motor sensory and DTR levels are equal and symmetric in the upper and lower extremities. Cranial nerves II through XII are grossly intact. Proprioception is intact. No peripheral adenopathy or edema is identified. No motor or sensory levels are noted. Crude visual  fields are within normal range.  RADIOLOGY RESULTS: CT scan reviewed compatible with above-stated findings  PLAN: Present time patient is doing well with no evidence of progressive disease by CT criteria him setting up with [facility] for evaluation of his upper airway I think he needs direct laryngoscopy.  That will be arranged in the next several weeks.  I have asked to see him back in 6 months for follow-up with a CT scan.  Should there be any pathology on ENT examination will evaluate that.  Patient knows to call with any concerns.  I would like to take this opportunity to thank you for allowing me to participate in the care of your patient.SABRA Marcey Penton, MD

## 2024-02-25 DIAGNOSIS — R0989 Other specified symptoms and signs involving the circulatory and respiratory systems: Secondary | ICD-10-CM | POA: Diagnosis not present

## 2024-02-25 DIAGNOSIS — R1310 Dysphagia, unspecified: Secondary | ICD-10-CM | POA: Diagnosis not present

## 2024-02-29 ENCOUNTER — Inpatient Hospital Stay: Attending: Oncology | Admitting: Oncology

## 2024-02-29 ENCOUNTER — Encounter: Payer: Self-pay | Admitting: Oncology

## 2024-02-29 VITALS — BP 115/66 | HR 49 | Temp 96.6°F | Resp 20 | Wt 146.2 lb

## 2024-02-29 DIAGNOSIS — Z79899 Other long term (current) drug therapy: Secondary | ICD-10-CM | POA: Insufficient documentation

## 2024-02-29 DIAGNOSIS — C3412 Malignant neoplasm of upper lobe, left bronchus or lung: Secondary | ICD-10-CM | POA: Insufficient documentation

## 2024-02-29 DIAGNOSIS — Z85118 Personal history of other malignant neoplasm of bronchus and lung: Secondary | ICD-10-CM | POA: Diagnosis not present

## 2024-02-29 DIAGNOSIS — R059 Cough, unspecified: Secondary | ICD-10-CM | POA: Insufficient documentation

## 2024-02-29 DIAGNOSIS — Z87891 Personal history of nicotine dependence: Secondary | ICD-10-CM | POA: Insufficient documentation

## 2024-02-29 DIAGNOSIS — Z08 Encounter for follow-up examination after completed treatment for malignant neoplasm: Secondary | ICD-10-CM | POA: Diagnosis not present

## 2024-02-29 DIAGNOSIS — Z923 Personal history of irradiation: Secondary | ICD-10-CM | POA: Diagnosis not present

## 2024-02-29 DIAGNOSIS — Z9221 Personal history of antineoplastic chemotherapy: Secondary | ICD-10-CM | POA: Insufficient documentation

## 2024-02-29 NOTE — Progress Notes (Signed)
 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  He was found to have a left upper lobe lung mass measuring 5.5 x 3.7 x 2.8 cm.  It was confluent with the left hilar lymph node.  Enlarged AP window lymph node measuring 16 mm.  Enlarged left paratracheal lymph node.  Low-attenuation 7 mm lesion in the liver.  7 mm cyst in the uncinate process of the pancreas for which follow-up was not recommended.  Bilateral kidney cysts.  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  Prostatic enlargement.   PET CT is can showed hypermetabolic left upper lobe lung mass measuring 5.4 x 3.9 cm with an SUV of 15.1.  Ipsilateral mediastinal lymph node metastases with index lymph node measuring 1.7 with an SUV of 9.8.  No evidence of distant metastatic disease.  Biopsy of the left upper lobe as well as station 4R lymph node was consistent with squamous cell carcinoma   NGS testing did not show any actionable mutations.  PD-L1 35%.  MSI stable.  MRI brain was negative for metastatic disease   Patient completed concurrent chemoradiation with weekly CarboTaxol  chemotherapy in December 2023.  Scans following that showed partial response to treatment.  Patient started adjuvant durvalumab  in January 2024.  Patient remains in remission since then    Interval history-patient has been having a nagging nonproductive cough ever since he completed radiation therapy.  Also reports occasional difficulty swallowing.  He was seen by ENT as well recently  ECOG PS- 1 Pain scale- 0   Review of systems- Review of Systems  Constitutional:  Negative for chills, fever, malaise/fatigue and weight loss.  HENT:  Negative for congestion, ear discharge and nosebleeds.   Eyes:  Negative for blurred vision.  Respiratory:  Positive for cough. Negative for hemoptysis, sputum production, shortness of breath and wheezing.   Cardiovascular:  Negative for chest pain, palpitations, orthopnea and claudication.  Gastrointestinal:  Negative for abdominal pain, blood in stool, constipation, diarrhea, heartburn, melena, nausea and vomiting.       Dysphagia  Genitourinary:  Negative for dysuria, flank pain, frequency, hematuria and urgency.  Musculoskeletal:  Negative for back pain, joint pain and myalgias.  Skin:  Negative for rash.  Neurological:  Negative for dizziness, tingling, focal weakness, seizures, weakness and headaches.  Endo/Heme/Allergies:  Does not bruise/bleed easily.  Psychiatric/Behavioral:  Negative for depression and suicidal ideas. The patient does not have insomnia.       Allergies  Allergen Reactions   Cefdinir  Nausea Only    hypersensitivity to smell   Doxycycline      Sun sensitivity   Prednisone     Hallucinations    Sertraline  Other (See Comments)    Hallucinations  Past Medical History:  Diagnosis Date   [facility])    a.) CHA2DS2-VASc = 6 (age x2, HTN, DVT x2, vascular disease history). b.) rate/rhythm maintained on oral carvedilol ; chronically anticoagulated using daily warfarin.   Actinic keratosis    Adenomatous polyp    Allergic  rhinitis    Anemia    Anticoagulated on warfarin    Aortic atherosclerosis (HCC)    Benign essential hypertension    Bilateral renal cysts    Bladder calculi    BPH with urinary obstruction    CAD (coronary artery disease)    Chronic venous insufficiency    DDD (degenerative disc disease), lumbar    a.) s/p LEFT laminectomy with L4-S1 decompression   Diverticulosis    DOE (dyspnea on exertion)    DVT (deep venous thrombosis) (HCC)    Elevated PSA    GERD (gastroesophageal reflux disease)    HLD (hyperlipidemia)    Lipoma of arm    Lipomatosis    NICM (nonischemic cardiomyopathy) (HCC) 11/22/2015   a.) TTE 11/22/2015: EF 40%. b.) TTE 12/27/2018: EF 45%. c.) TTE 08/30/2020: EF 45-50%.   Night sweats    Obesity    Osteoarthritis    Pulmonary emphysema (HCC)    RBBB (right bundle branch block)    Right inguinal hernia    Right renal mass 04/24/2022   a.) CT A/P 04/24/22: 2.7 x 2.8 cm enhancing septation lateral aspect RIGHT kidney concerning for cystic renal neoplasm; b.) CT chest 05/15/22: 3 cm RIGHT renal mass; c.) CT A/P 08/17/22: 2.9 x 2.6 cm exophytic mass; d.) CT A/P 11/19/22: 3.0 x 2.6 cm solid & cystic mass peripheral superior RIGHT renal pole c/w RCC (Bosniak IV); e.) CT A/P 03/30/23: 3.1 x 2.6 cm; f.) CT A/P 07/21/23: 3.3 x 2.9 cm   Squamous cell lung cancer (HCC) 04/24/2022   a.)  CT chest 04/24/2022: Multilobulated perihilar mass measuring 5.3 x 3.7 x 2.8 cm with associated LEFT hilar and mediastinal LAD; b.)  PET CT 05/07/2022: Hypermetabolic LEFT upper lobe lung mass (max SUV 15.1); ipsilateral nodal metastasis --> presuming a NSC histology, consistent with stage IIIb pulmonary neoplasm (T3 N2 M0) --> path (+) NSCLC (SCC)   Thoracic spondylosis    VHD (valvular heart disease) 11/22/2015   a.) TTE 11/22/2015: EF 40%; mod MR, mod-sev TR, triv PR; mod BAE, mild RV enlargement. b.) TTE 12/27/2018: EF 45%; mod MR, mod-sev TR, triv PR; mod BAE, mild RV enlargement. c.) TTE  08/30/2020: EF 45-50%; mild MR/TR.     Past Surgical History:  Procedure Laterality Date   Bilateral Radical Keratotomy Bilateral 1997   COLONOSCOPY WITH PROPOFOL  N/A 01/11/2016   Procedure: COLONOSCOPY WITH PROPOFOL ;  Surgeon: Lamar ONEIDA Holmes, MD;  Location: [facility] ENDOSCOPY;  Service: Endoscopy;  Laterality: N/A; repeat 3 years, tubular adenoma   DENTAL SURGERY     Patient had implants   FLEXIBLE BRONCHOSCOPY Left 05/19/2022   Procedure: FLEXIBLE BRONCHOSCOPY;  Surgeon: Tamea Dedra CROME, MD;  Location: [facility] ORS;  Service: Cardiopulmonary;  Laterality: Left;   GUM SURGERY     IR IMAGING GUIDED PORT INSERTION  06/13/2022   IR RADIOLOGIST EVAL & MGMT  10/06/2023   IR RADIOLOGIST EVAL & MGMT  11/19/2023   L4 - S1 decompression Left    PROSTATE BIOPSY     TONSILLECTOMY AND ADENOIDECTOMY  age 32   VIDEO BRONCHOSCOPY WITH ENDOBRONCHIAL ULTRASOUND Left 05/19/2022   Procedure: VIDEO BRONCHOSCOPY WITH ENDOBRONCHIAL ULTRASOUND;  Surgeon: Tamea Dedra CROME, MD;  Location:  [facility] ORS;  Service: Cardiopulmonary;  Laterality: Left;   XI ROBOTIC ASSISTED INGUINAL HERNIA REPAIR WITH MESH Right 10/21/2021   Procedure: XI ROBOTIC ASSISTED INGUINAL HERNIA REPAIR WITH MESH;  Surgeon: Lane Shope, MD;  Location: [facility] ORS;  Service: General;  Laterality: Right;    Social History   Socioeconomic History   Marital status: Married    Spouse name: Doris   Number of children: 3   Years of education: Not on file   Highest education level: Associate degree: occupational, Scientist, product/process development, or vocational program  Occupational History   Occupation: retired  Tobacco Use   Smoking status: Former    Current packs/day: 0.00    Types: Cigarettes    Quit date: 08/26/1971    Years since quitting: 52.5    Passive exposure: Never   Smokeless tobacco: Never   Tobacco comments:    Smoked for about 20 years.  Vaping Use   Vaping status: Never Used  Substance and Sexual Activity   Alcohol use: Not Currently   Drug use:  No   Sexual activity: Not Currently  Other Topics Concern   Not on file  Social History Narrative   Not on file   Social Drivers of Health   Financial Resource Strain: Low Risk  (02/02/2024)   Received from [facility]   Overall Financial Resource Strain (CARDIA)    Difficulty of Paying Living Expenses: Not hard at all  Food Insecurity: No Food Insecurity (02/02/2024)   Received from [facility]   Hunger Vital Sign    Within the past 12 months, you worried that your food would run out before you got the money to buy more.: Never true    Within the past 12 months, the food you bought just didn't last and you didn't have money to get more.: Never true  Transportation Needs: No Transportation Needs (02/02/2024)   Received from [facility] - Transportation    In the past 12 months, has lack of transportation kept you from medical appointments or from getting medications?: No    Lack of Transportation (Non-Medical): No  Physical Activity: Sufficiently Active (12/23/2022)   Exercise Vital Sign    Days of Exercise per Week: 7 days    Minutes of Exercise per Session: 50 min  Stress: No Stress Concern Present (12/18/2021)   [employer] of Occupational Health - Occupational Stress Questionnaire    Feeling of Stress : Only a little  Social Connections: Moderately Integrated (12/23/2022)   Social Connection and Isolation Panel    Frequency of Communication with Friends and Family: Twice a week    Frequency of Social Gatherings with Friends and Family: Once a week    Attends Religious Services: More than 4 times per year    Active Member of [employer] or Organizations: No    Attends Banker Meetings: Never    Marital Status: Married  Catering manager Violence: Not At Risk (12/23/2022)   Humiliation, Afraid, Rape, and Kick questionnaire    Fear of Current or Ex-Partner: No    Emotionally Abused: No    Physically  Abused: No    Sexually Abused: No    Family History  Problem Relation Age of Onset   Cancer Mother        secondary to cancinoma of the jaw from her dipping snuff.   Heart attack Father    CAD Father    Hypertension Sister    Diabetes Son  Type I diabetes   Prostate cancer Neg Hx    Kidney cancer Neg Hx      Current Outpatient Medications:    amLODipine  (NORVASC ) 5 MG tablet, TAKE 1 TABLET (5 MG TOTAL) BY MOUTH DAILY., Disp: 90 tablet, Rfl: 3   benazepril  (LOTENSIN ) 40 MG tablet, Take 1 tablet (40 mg total) by mouth daily., Disp: 90 tablet, Rfl: 1   carvedilol  (COREG ) 6.25 MG tablet, TAKE 1 TABLET (6.25 MG TOTAL) BY MOUTH 2 (TWO) TIMES DAILY., Disp: 180 tablet, Rfl: 3   Cyanocobalamin  (VITAMIN B 12 PO), Take 1 capsule by mouth 2 (two) times daily. 1000 mcg each capsule, Disp: , Rfl:    doxazosin  (CARDURA ) 8 MG tablet, Take 8 mg by mouth at bedtime., Disp: , Rfl:    finasteride  (PROSCAR ) 5 MG tablet, TAKE 1 TABLET EVERY DAY, Disp: 90 tablet, Rfl: 3   gabapentin  (NEURONTIN ) 300 MG capsule, TAKE 1 CAPSULE EVERY MORNING, 1 CAPSULE IN THE AFTERNOON AND 2 CAPSULES AT BEDTIME, Disp: 360 capsule, Rfl: 3   Magnesium 500 MG TABS, Take 500 mg by mouth daily., Disp: , Rfl:    Multiple Vitamin (MULTI-VITAMIN) tablet, Take 1 tablet by mouth daily. , Disp: , Rfl:    oxyCODONE  (OXY IR/ROXICODONE ) 5 MG immediate release tablet, Take 1 tablet (5 mg total) by mouth every 6 (six) hours as needed for severe pain., Disp: 90 tablet, Rfl: 0   triamterene -hydrochlorothiazide (MAXZIDE-25) 37.5-25 MG tablet, TAKE 1/2 TABLET EVERY DAY, Disp: 45 tablet, Rfl: 1   warfarin (COUMADIN ) 5 MG tablet, TAKE 1 TABLET EVERY DAY, Disp: 90 tablet, Rfl: 3 No current facility-administered medications for this visit.  Facility-Administered Medications Ordered in Other Visits:    heparin  lock flush 100 UNIT/ML injection, , , ,   Physical exam:  Vitals:   02/29/24 0940  BP: 115/66  Pulse: (!) 49  Resp: 20  Temp:  (!) 96.6 F (35.9 C)  SpO2: 100%  Weight: 146 lb 3.2 oz (66.3 kg)   Physical Exam Cardiovascular:     Rate and Rhythm: Normal rate and regular rhythm.     Heart sounds: Normal heart sounds.  Pulmonary:     Effort: Pulmonary effort is normal.     Breath sounds: Normal breath sounds.  Skin:    General: Skin is warm and dry.  Neurological:     Mental Status: He is alert and oriented to person, place, and time.      I have personally reviewed labs listed below:    Latest Ref Rng & Units 11/02/2023    7:44 AM  CMP  Glucose 70 - 99 mg/dL 890   BUN 8 - 23 mg/dL 23   Creatinine 9.38 - 1.24 mg/dL 8.93   Sodium 864 - 854 mmol/L 141   Potassium 3.5 - 5.1 mmol/L 3.6   Chloride 98 - 111 mmol/L 109   CO2 22 - 32 mmol/L 26   Calcium 8.9 - 10.3 mg/dL 8.8       Latest Ref Rng & Units 11/02/2023    7:44 AM  CBC  WBC 4.0 - 10.5 K/uL 3.8   Hemoglobin 13.0 - 17.0 g/dL 88.3   Hematocrit 60.9 - 52.0 % 35.2   Platelets 150 - 400 K/uL 117    I have personally reviewed Radiology images listed below: No images are attached to the encounter.  CT CHEST ABDOMEN PELVIS W CONTRAST Result Date: 02/12/2024 CLINICAL DATA:  LEFT lung neoplasm. Renal ablation. Immunotherapy ongoing. * Tracking Code: BO *  EXAM: CT CHEST, ABDOMEN, AND PELVIS WITH CONTRAST TECHNIQUE: Multidetector CT imaging of the chest, abdomen and pelvis was performed following the standard protocol during bolus administration of intravenous contrast. RADIATION DOSE REDUCTION: This exam was performed according to the departmental dose-optimization program which includes automated exposure control, adjustment of the mA and/or kV according to patient size and/or use of iterative reconstruction technique. CONTRAST:  85mL OMNIPAQUE  IOHEXOL  300 MG/ML  SOLN COMPARISON:  CT 07/21/2023 FINDINGS: CT CHEST FINDINGS Cardiovascular: No significant vascular findings. Normal heart size. No pericardial effusion. Port in the anterior chest wall with tip in  distal SVC. Mediastinum/Nodes: No axillary or supraclavicular adenopathy. No mediastinal or hilar adenopathy. No pericardial fluid. Esophagus normal. Lungs/Pleura: LEFT perihilar angular consolidation unchanged from prior typical of post radiation change. No new nodularity. Suspicious nodules in LEFT or RIGHT lung. Musculoskeletal: No aggressive osseous lesion. CT ABDOMEN AND PELVIS FINDINGS Hepatobiliary: No focal hepatic lesion. No biliary ductal dilatation. Gallbladder is normal. Common bile duct is normal. Pancreas: Pancreas is normal. No ductal dilatation. No pancreatic inflammation. Spleen: Normal spleen Adrenals/urinary tract: Adrenal glands normal. Interval contraction of the exophytic cystic lesion along the margin the RIGHT kidney. Lesion measures 2.5 cm (67/2) decreased from 3.3 cm. Percutaneous ablation of lesion on 11/02/2023. Benign cystic lesion lower pole of the RIGHT kidney are unchanged. No hydronephrosis or ureters and bladder normal. Stomach/Bowel: Stomach, small bowel, appendix, and cecum are normal. The colon and rectosigmoid colon are normal. Vascular/Lymphatic: Abdominal aorta is normal caliber with atherosclerotic calcification. There is no retroperitoneal or periportal lymphadenopathy. No pelvic lymphadenopathy. Reproductive: Prostate enlarged measuring 6.1 cm diameter Other: No free fluid. Musculoskeletal: No aggressive osseous lesion. Multiple levels of endplate sclerosis and osteophytosis consistent with degenerative disc osteophytic disease. IMPRESSION: CHEST: 1. Stable post radiation change in the LEFT perihilar region. 2. No evidence of lung cancer recurrence. 3. No evidence of thoracic metastasis. PELVIS: 1. No evidence of metastatic disease in the abdomen pelvis. 2. Interval contraction of exophytic cystic lesion along the margin of the RIGHT kidney following microwave ablation. 3. Benign Bosniak 1 renal cysts of the RIGHT kidney. No follow-up imaging is recommended. JACR 2018 Feb;  [id number], Management of the Incidental Renal Mass on CT, [employer] 2021; [id number], Bosniak Classification of Cystic Renal Masses, Version 2019. 4.  Aortic Atherosclerosis (ICD10-I70.0). Electronically Signed   By: Jackquline Boxer M.D.   On: 02/12/2024 12:48     Assessment and plan- Patient is a 89 y.o. male with history of stage III squamous cell carcinoma of the left upper lobe T3 N2 M0 s/p concurrent weekly CarboTaxol with radiation.  He is currently in remission and this is a routine follow-up visit  I have reviewed CT chest abdomen pelvis images independently and discussed findings with the patient.  Patient completed concurrent chemoradiation in December 2024.  His present scans do not reveal any evidence of recurrent or progressive disease.  Stable postradiation changes in the left perihilar region.  He has also undergone microwave ablation of his right kidney lesion which is presently showing shrinkage.  I will plan to get repeat scans in about 5 months and see him thereafter.  Cough: This was evaluated by ENT and exam did not show any abnormal findings.  He is on benazepril  which was thought to be a possible reason for his cough and he will be discussing with his PCP if that medication can be changed.  However patient has been on benazepril  for many years and states that the cough symptoms only started  after radiation.  Patient also reports some subjective dysphagia and was referred to GI for consideration of EGD.  He has yet to hear from them.   Visit Diagnosis 1. Encounter for follow-up surveillance of lung cancer      Dr. Annah Skene, MD, MPH [facility] [phone] 02/29/2024 12:57 PM

## 2024-03-04 DIAGNOSIS — Z7901 Long term (current) use of anticoagulants: Secondary | ICD-10-CM | POA: Diagnosis not present

## 2024-03-04 DIAGNOSIS — I482 Chronic atrial fibrillation, unspecified: Secondary | ICD-10-CM | POA: Diagnosis not present

## 2024-03-28 ENCOUNTER — Encounter: Payer: Self-pay | Admitting: Dermatology

## 2024-03-28 ENCOUNTER — Ambulatory Visit: Admitting: Dermatology

## 2024-03-28 DIAGNOSIS — L89151 Pressure ulcer of sacral region, stage 1: Secondary | ICD-10-CM

## 2024-03-28 DIAGNOSIS — L578 Other skin changes due to chronic exposure to nonionizing radiation: Secondary | ICD-10-CM | POA: Diagnosis not present

## 2024-03-28 DIAGNOSIS — L219 Seborrheic dermatitis, unspecified: Secondary | ICD-10-CM

## 2024-03-28 DIAGNOSIS — L814 Other melanin hyperpigmentation: Secondary | ICD-10-CM

## 2024-03-28 DIAGNOSIS — Z1283 Encounter for screening for malignant neoplasm of skin: Secondary | ICD-10-CM | POA: Diagnosis not present

## 2024-03-28 DIAGNOSIS — D229 Melanocytic nevi, unspecified: Secondary | ICD-10-CM | POA: Diagnosis not present

## 2024-03-28 DIAGNOSIS — Z7189 Other specified counseling: Secondary | ICD-10-CM

## 2024-03-28 DIAGNOSIS — D692 Other nonthrombocytopenic purpura: Secondary | ICD-10-CM | POA: Diagnosis not present

## 2024-03-28 DIAGNOSIS — Z79899 Other long term (current) drug therapy: Secondary | ICD-10-CM

## 2024-03-28 DIAGNOSIS — D1801 Hemangioma of skin and subcutaneous tissue: Secondary | ICD-10-CM

## 2024-03-28 DIAGNOSIS — W908XXA Exposure to other nonionizing radiation, initial encounter: Secondary | ICD-10-CM | POA: Diagnosis not present

## 2024-03-28 DIAGNOSIS — L299 Pruritus, unspecified: Secondary | ICD-10-CM

## 2024-03-28 MED ORDER — TACROLIMUS 0.1 % EX OINT: TOPICAL_OINTMENT | CUTANEOUS | 1 refills | Status: AC

## 2024-03-28 MED ORDER — KETOCONAZOLE 2 % EX CREA
TOPICAL_CREAM | CUTANEOUS | 11 refills | Status: DC
Start: 2024-03-28 — End: 2024-06-27

## 2024-03-28 NOTE — Patient Instructions (Signed)

## 2024-03-28 NOTE — Progress Notes (Signed)
 Follow-Up Visit   Subjective  Casey Gallegos is a 89 y.o. male who presents for the following: Skin Cancer Screening and Full Body Skin Exam, hx of precancers. Patient c/o sore spot on his tail bone for ~2 years, will not go away, tried Opzelura cream in the past no help.  The patient presents for Total-Body Skin Exam (TBSE) for skin cancer screening and mole check. The patient has spots, moles and lesions to be evaluated, some may be new or changing and the patient may have concern these could be cancer.  The following portions of the chart were reviewed this encounter and updated as appropriate: medications, allergies, medical history  Review of Systems:  No other skin or systemic complaints except as noted in HPI or Assessment and Plan.  Objective  Well appearing patient in no apparent distress; mood and affect are within normal limits.  A full examination was performed including scalp, head, eyes, ears, nose, lips, neck, chest, axillae, abdomen, back, buttocks, bilateral upper extremities, bilateral lower extremities, hands, feet, fingers, toes, fingernails, and toenails. All findings within normal limits unless otherwise noted below.   Relevant physical exam findings are noted in the Assessment and Plan.    Assessment & Plan   SKIN CANCER SCREENING PERFORMED TODAY.  ACTINIC DAMAGE - Chronic condition, secondary to cumulative UV/sun exposure - diffuse scaly erythematous macules with underlying dyspigmentation - Recommend daily broad spectrum sunscreen SPF 30+ to sun-exposed areas, reapply every 2 hours as needed.  - Staying in the shade or wearing long sleeves, sun glasses (UVA+UVB protection) and wide brim hats (4-inch brim around the entire circumference of the hat) are also recommended for sun protection.  - Call for new or changing lesions.  LENTIGINES, SEBORRHEIC KERATOSES, HEMANGIOMAS - Benign normal skin lesions - Benign-appearing - Call for any changes  MELANOCYTIC  NEVI - Tan-brown and/or pink-flesh-colored symmetric macules and papules - Benign appearing on exam today - Observation - Call clinic for new or changing moles - Recommend daily use of broad spectrum spf 30+ sunscreen to sun-exposed areas.   Purpura - Chronic; persistent and recurrent.  Treatable, but not curable. Arms  - Violaceous macules and patches - Benign - Related to trauma, age, sun damage and/or use of blood thinners, chronic use of topical and/or oral steroids - Observe - Can use OTC arnica containing moisturizer such as Dermend Bruise Formula if desired - Call for worsening or other concerns   VASCULAR BIRTHMARK  Left knee  Observe    Pressure sore = Impending DECUBITUS ULCER Failed Opzelura cream  sacral coccyx area- with erythema and pain  Start Tacrolimus  apply to sore area on buttock twice a day  No cure can only control   SEBORRHEIC DERMATITIS Exam: Pink patches with greasy scale on the face  Chronic and persistent condition with duration or expected duration over one year. Condition is symptomatic/ bothersome to patient. Not currently at goal.  Seborrheic Dermatitis is a chronic persistent rash characterized by pinkness and scaling most commonly of the mid face but also can occur on the scalp (dandruff), ears; mid chest, mid back and groin.  It tends to be exacerbated by stress and cooler weather.  People who have neurologic disease may experience new onset or exacerbation of existing seborrheic dermatitis.  The condition is not curable but treatable and can be controlled. Treatment Plan: Start Ketoconazole  cream apply to areas on his forehead and nose qd-bid     Return in about 1 year (around 03/28/2025) for  TBSE .  I, Fay Kirks, CMA, am acting as scribe for Alm Rhyme, MD .   Documentation: I have reviewed the above documentation for accuracy and completeness, and I agree with the above.  Alm Rhyme, MD

## 2024-03-29 ENCOUNTER — Telehealth: Payer: Self-pay

## 2024-03-29 ENCOUNTER — Other Ambulatory Visit: Payer: Self-pay

## 2024-03-29 MED ORDER — MOMETASONE FUROATE 0.1 % EX CREA: 1.0000 | TOPICAL_CREAM | CUTANEOUS | 1 refills | Status: DC

## 2024-03-29 NOTE — Telephone Encounter (Signed)
 Patient and spouse aware of medication change. aw

## 2024-03-29 NOTE — Telephone Encounter (Signed)
 Patient and spouse came into office regarding medications from yesterday's office visit. He has already tried the Tacrolimus  Ointment with no improvement. Spouse brought tubes of Opzelura and Tacrolimus  to confirm past treatments.   Patient is asking for different alternative.

## 2024-04-01 DIAGNOSIS — I482 Chronic atrial fibrillation, unspecified: Secondary | ICD-10-CM | POA: Diagnosis not present

## 2024-04-01 DIAGNOSIS — Z7901 Long term (current) use of anticoagulants: Secondary | ICD-10-CM | POA: Diagnosis not present

## 2024-04-19 ENCOUNTER — Other Ambulatory Visit: Payer: Self-pay | Admitting: Urology

## 2024-04-19 DIAGNOSIS — N138 Other obstructive and reflux uropathy: Secondary | ICD-10-CM

## 2024-04-19 NOTE — Telephone Encounter (Signed)
 WILL NEED TO SCHEDULE A F/U APPT IN JANUARY TO REFILL MED AGAIN

## 2024-04-29 DIAGNOSIS — I482 Chronic atrial fibrillation, unspecified: Secondary | ICD-10-CM | POA: Diagnosis not present

## 2024-05-13 DIAGNOSIS — I482 Chronic atrial fibrillation, unspecified: Secondary | ICD-10-CM | POA: Diagnosis not present

## 2024-05-13 DIAGNOSIS — I10 Essential (primary) hypertension: Secondary | ICD-10-CM | POA: Diagnosis not present

## 2024-05-13 DIAGNOSIS — E782 Mixed hyperlipidemia: Secondary | ICD-10-CM | POA: Diagnosis not present

## 2024-05-13 DIAGNOSIS — R9431 Abnormal electrocardiogram [ECG] [EKG]: Secondary | ICD-10-CM | POA: Diagnosis not present

## 2024-05-27 DIAGNOSIS — I482 Chronic atrial fibrillation, unspecified: Secondary | ICD-10-CM | POA: Diagnosis not present

## 2024-05-27 DIAGNOSIS — Z7901 Long term (current) use of anticoagulants: Secondary | ICD-10-CM | POA: Diagnosis not present

## 2024-06-06 ENCOUNTER — Other Ambulatory Visit

## 2024-06-06 ENCOUNTER — Other Ambulatory Visit: Payer: Self-pay | Admitting: Interventional Radiology

## 2024-06-06 ENCOUNTER — Inpatient Hospital Stay: Payer: HMO | Attending: Oncology

## 2024-06-06 DIAGNOSIS — Z452 Encounter for adjustment and management of vascular access device: Secondary | ICD-10-CM | POA: Diagnosis not present

## 2024-06-06 DIAGNOSIS — N2889 Other specified disorders of kidney and ureter: Secondary | ICD-10-CM

## 2024-06-06 DIAGNOSIS — C3412 Malignant neoplasm of upper lobe, left bronchus or lung: Secondary | ICD-10-CM | POA: Insufficient documentation

## 2024-06-07 ENCOUNTER — Inpatient Hospital Stay: Admission: RE | Admit: 2024-06-07

## 2024-06-23 ENCOUNTER — Other Ambulatory Visit: Payer: Self-pay | Admitting: Dermatology

## 2024-06-23 DIAGNOSIS — M793 Panniculitis, unspecified: Secondary | ICD-10-CM

## 2024-06-24 DIAGNOSIS — Z7901 Long term (current) use of anticoagulants: Secondary | ICD-10-CM | POA: Diagnosis not present

## 2024-06-24 DIAGNOSIS — I482 Chronic atrial fibrillation, unspecified: Secondary | ICD-10-CM | POA: Diagnosis not present

## 2024-06-27 ENCOUNTER — Ambulatory Visit: Admitting: Dermatology

## 2024-06-27 DIAGNOSIS — L578 Other skin changes due to chronic exposure to nonionizing radiation: Secondary | ICD-10-CM | POA: Diagnosis not present

## 2024-06-27 DIAGNOSIS — L219 Seborrheic dermatitis, unspecified: Secondary | ICD-10-CM

## 2024-06-27 DIAGNOSIS — M793 Panniculitis, unspecified: Secondary | ICD-10-CM | POA: Diagnosis not present

## 2024-06-27 DIAGNOSIS — W908XXA Exposure to other nonionizing radiation, initial encounter: Secondary | ICD-10-CM | POA: Diagnosis not present

## 2024-06-27 DIAGNOSIS — Z7189 Other specified counseling: Secondary | ICD-10-CM | POA: Diagnosis not present

## 2024-06-27 DIAGNOSIS — L570 Actinic keratosis: Secondary | ICD-10-CM | POA: Diagnosis not present

## 2024-06-27 DIAGNOSIS — Z872 Personal history of diseases of the skin and subcutaneous tissue: Secondary | ICD-10-CM | POA: Diagnosis not present

## 2024-06-27 MED ORDER — KETOCONAZOLE 2 % EX CREA: TOPICAL_CREAM | CUTANEOUS | 11 refills | Status: DC

## 2024-06-27 NOTE — Progress Notes (Signed)
 Follow-Up Visit   Subjective  Casey Gallegos is a 89 y.o. male who presents for the following: Actinic keratosis.  The patient has spots, moles and lesions to be evaluated, some may be new or changing. He has seborrheic dermatitis of the face, improving with ketoconazole  2% cream. He states he needs new prescription. Also has area on lower leg that doesn't clear up and gets sore and swollen.  Has been seen by vascular surgery and ultrasound was normal.   The following portions of the chart were reviewed this encounter and updated as appropriate: medications, allergies, medical history  Review of Systems:  No other skin or systemic complaints except as noted in HPI or Assessment and Plan.  Objective  Well appearing patient in no apparent distress; mood and affect are within normal limits.  A focused examination was performed of the following areas: face  Relevant exam findings are noted in the Assessment and Plan.  right ear helix x 1 Pink scaly macule.   Assessment & Plan  AK (ACTINIC KERATOSIS) right ear helix x 1 Actinic keratoses are precancerous spots that appear secondary to cumulative UV radiation exposure/sun exposure over time. They are chronic with expected duration over 1 year. A portion of actinic keratoses will progress to squamous cell carcinoma of the skin. It is not possible to reliably predict which spots will progress to skin cancer and so treatment is recommended to prevent development of skin cancer.  Recommend daily broad spectrum sunscreen SPF 30+ to sun-exposed areas, reapply every 2 hours as needed.  Recommend staying in the shade or wearing long sleeves, sun glasses (UVA+UVB protection) and wide brim hats (4-inch brim around the entire circumference of the hat). Call for new or changing lesions. Destruction of lesion - right ear helix x 1  Destruction method: cryotherapy   Informed consent: discussed and consent obtained   Lesion destroyed using liquid  nitrogen: Yes   Region frozen until ice ball extended beyond lesion: Yes   Outcome: patient tolerated procedure well with no complications   Post-procedure details: wound care instructions given   Additional details:  Prior to procedure, discussed risks of blister formation, small wound, skin dyspigmentation, or rare scar following cryotherapy. Recommend Vaseline ointment to treated areas while healing.    ACTINIC DAMAGE - chronic, secondary to cumulative UV radiation exposure/sun exposure over time - diffuse scaly erythematous macules with underlying dyspigmentation - Recommend daily broad spectrum sunscreen SPF 30+ to sun-exposed areas, reapply every 2 hours as needed.  - Recommend staying in the shade or wearing long sleeves, sun glasses (UVA+UVB protection) and wide brim hats (4-inch brim around the entire circumference of the hat). - Call for new or changing lesions.   SEBORRHEIC DERMATITIS Exam: face clear today  Chronic and persistent condition with duration or expected duration over one year. Condition is improving with treatment but not currently at goal.    Seborrheic Dermatitis is a chronic persistent rash characterized by pinkness and scaling most commonly of the mid face but also can occur on the scalp (dandruff), ears; mid chest, mid back and groin.  It tends to be exacerbated by stress and cooler weather.  People who have neurologic disease may experience new onset or exacerbation of existing seborrheic dermatitis.  The condition is not curable but treatable and can be controlled.  Treatment Plan: Continue ketoconazole  2% cream once to twice daily to affected areas face.  HISTORY OF PRESSURE SORE sacral coccyx area - not examined today. Patient states area  is bruised only, worse when sitting on firm surfaces.  D/c mometasone  cream on buttocks area. Recommend moisturizer to affected area buttocks. Continue cushioned pillow when sitting to avoid pressure.     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   Chronic and persistent condition with duration or expected duration over one year. Condition is symptomatic/ bothersome to patient. Not currently at goal.   Counseling: Lipodermatosclerosis is a chronic inflammatory condition of unknown cause of the subcutaneous fat causing tenderness, discoloration and hardening of the involved skin, most commonly on the lower legs.  It may progress and gradually worsen over time, especially in the setting of chronic leg swelling. Daily compression stockings/hose is recommended.  Topical and intralesional steroids; Therapeutic Ultrasound and some systemic medications may be helpful.  Consultation with Vascular specialists may yield other options.      Treatment Plan: Cont Compression socks daily Start mometasone  cream twice daily to aa prn flares (swelling/tenderness). Pt has at home. Patient has appointment at the wound center 07/11/24.   Return as scheduled with Dr Hester, for TBSE.  IAndrea Kerns, CMA, am acting as scribe for Rexene Rattler, MD .   Documentation: I have reviewed the above documentation for accuracy and completeness, and I agree with the above.  Rexene Rattler, MD

## 2024-06-27 NOTE — Patient Instructions (Addendum)
 Mometasone  Cream - apply to area on right lower leg twice daily up to 5 days a week until improved. Stop using mometasone  on buttocks area. Recommend moisturizer to affected area buttocks. Continue cushioned pillow when sitting to avoid pressure.   Lipodermatosclerosis is a chronic inflammatory condition of unknown cause of the subcutaneous fat causing tenderness, discoloration and hardening of the involved skin, most commonly on the lower legs.  It may progress and gradually worsen over time, especially in the setting of chronic leg swelling. Daily compression stockings/hose is recommended.  Topical and intralesional steroids; Therapeutic Ultrasound and some systemic medications may be helpful.  Consultation with Vascular specialists may yield other options.     Due to recent changes in healthcare laws, you may see results of your pathology and/or laboratory studies on MyChart before the doctors have had a chance to review them. We understand that in some cases there may be results that are confusing or concerning to you. Please understand that not all results are received at the same time and often the doctors may need to interpret multiple results in order to provide you with the best plan of care or course of treatment. Therefore, we ask that you please give us  2 business days to thoroughly review all your results before contacting the office for clarification. Should we see a critical lab result, you will be contacted sooner.   If You Need Anything After Your Visit  If you have any questions or concerns for your doctor, please call our main line at [phone] and press option 4 to reach your doctor's medical assistant. If no one answers, please leave a voicemail as directed and we will return your call as soon as possible. Messages left after 4 pm will be answered the following business day.   You may also send us  a message via MyChart. We typically respond to MyChart messages within 1-2 business  days.  For prescription refills, please ask your pharmacy to contact our office. Our fax number is [phone].  If you have an urgent issue when the clinic is closed that cannot wait until the next business day, you can page your doctor at the number below.    Please note that while we do our best to be available for urgent issues outside of office hours, we are not available 24/7.   If you have an urgent issue and are unable to reach us , you may choose to seek medical care at your doctor's office, retail clinic, urgent care center, or emergency room.  If you have a medical emergency, please immediately call 911 or go to the emergency department.  Pager Numbers  - Dr. Hester: [phone]  - Dr. Jackquline: [phone]  - Dr. Claudene: [phone]   - Dr. Raymund: [phone]  In the event of inclement weather, please call our main line at [phone] for an update on the status of any delays or closures.  Dermatology Medication Tips: Please keep the boxes that topical medications come in in order to help keep track of the instructions about where and how to use these. Pharmacies typically print the medication instructions only on the boxes and not directly on the medication tubes.   If your medication is too expensive, please contact our office at [phone] option 4 or send us  a message through MyChart.   We are unable to tell what your co-pay for medications will be in advance as this is different depending on your insurance coverage. However, we may be able to find  a substitute medication at lower cost or fill out paperwork to get insurance to cover a needed medication.   If a prior authorization is required to get your medication covered by your insurance company, please allow us  1-2 business days to complete this process.  Drug prices often vary depending on where the prescription is filled and some pharmacies may offer cheaper prices.  The website www.goodrx.com contains  coupons for medications through different pharmacies. The prices here do not account for what the cost may be with help from insurance (it may be cheaper with your insurance), but the website can give you the price if you did not use any insurance.  - You can print the associated coupon and take it with your prescription to the pharmacy.  - You may also stop by our office during regular business hours and pick up a GoodRx coupon card.  - If you need your prescription sent electronically to a different pharmacy, notify our office through [facility] or by phone at [phone] option 4.     Si Usted Necesita Algo Despus de Su Visita  Tambin puede enviarnos un mensaje a travs de Clinical Cytogeneticist. Por lo general respondemos a los mensajes de MyChart en el transcurso de 1 a 2 das hbiles.  Para renovar recetas, por favor pida a su farmacia que se ponga en contacto con nuestra oficina. Randi lakes de fax es [place] [phone].  Si tiene un asunto urgente cuando la clnica est cerrada y que no puede esperar hasta el siguiente da hbil, puede llamar/localizar a su doctor(a) al nmero que aparece a continuacin.   Por favor, tenga en cuenta que aunque hacemos todo lo posible para estar disponibles para asuntos urgentes fuera del horario de [place], no estamos disponibles las 24 horas del da, los 7 [address]  [address] de la [place].   Si tiene un problema urgente y no puede comunicarse con nosotros, puede optar por buscar atencin mdica  en el consultorio de su doctor(a), en una clnica privada, en un centro de atencin urgente o en una sala de emergencias.  Si tiene engineer, drilling, por favor llame inmediatamente al 911 o vaya a la sala de emergencias.  Nmeros de bper  - Dr. Hester: [phone]  - Dra. Jackquline: [phone]  - Dr. Claudene: [phone]  - Dra. Kitts: [phone]  En caso de inclemencias del [place], por favor llame a nuestra lnea principal al [phone] para una  actualizacin sobre el estado de cualquier retraso o cierre.  Consejos para la medicacin en dermatologa: Por favor, guarde las cajas en las que vienen los medicamentos de uso tpico para ayudarle a seguir las instrucciones sobre dnde y cmo usarlos. Las farmacias generalmente imprimen las instrucciones del medicamento slo en las cajas y no directamente en los tubos del [place].   Si su medicamento es muy caro, por favor, pngase en contacto con landry rieger llamando al [phone] y presione la opcin 4 o envenos un mensaje a travs de Clinical Cytogeneticist.   No podemos decirle cul ser su copago por los medicamentos por adelantado ya que esto es diferente dependiendo de la cobertura de su seguro. Sin embargo, es posible que podamos encontrar un medicamento sustituto a audiological scientist un formulario para que el seguro cubra el medicamento que se considera necesario.   Si se requiere una autorizacin previa para que su compaa de seguros cubra su medicamento, por favor permtanos de 1 a 2 das hbiles para completar este proceso.  Los precios de los [place]  varan con frecuencia dependiendo del lugar de dnde se surte la receta y alguna farmacias pueden ofrecer precios ms baratos.  El sitio web www.goodrx.com tiene cupones para medicamentos de health and safety inspector. Los precios aqu no tienen en cuenta lo que podra costar con la ayuda del seguro (puede ser ms barato con su seguro), pero el sitio web puede darle el precio si no utiliz tourist information centre manager.  - Puede imprimir el cupn correspondiente y llevarlo con su receta a la farmacia.  - Tambin puede pasar por nuestra oficina durante el horario de atencin regular y education officer, museum una tarjeta de cupones de GoodRx.  - Si necesita que su receta se enve electrnicamente a una farmacia diferente, informe a nuestra oficina a travs de MyChart de [place] o por telfono llamando al [phone] y presione la opcin 4.

## 2024-06-29 ENCOUNTER — Ambulatory Visit
Admission: RE | Admit: 2024-06-29 | Discharge: 2024-06-29 | Disposition: A | Discharge status: Home / Self Care | Source: Ambulatory Visit | Attending: Physician Assistant | Admitting: Physician Assistant

## 2024-06-29 ENCOUNTER — Encounter: Attending: Physician Assistant | Admitting: Physician Assistant

## 2024-06-29 ENCOUNTER — Other Ambulatory Visit: Payer: Self-pay | Admitting: Physician Assistant

## 2024-06-29 DIAGNOSIS — M7989 Other specified soft tissue disorders: Secondary | ICD-10-CM | POA: Diagnosis not present

## 2024-06-29 DIAGNOSIS — Z8529 Personal history of malignant neoplasm of other respiratory and intrathoracic organs: Secondary | ICD-10-CM | POA: Diagnosis not present

## 2024-06-29 DIAGNOSIS — I10 Essential (primary) hypertension: Secondary | ICD-10-CM | POA: Diagnosis not present

## 2024-06-29 DIAGNOSIS — I480 Paroxysmal atrial fibrillation: Secondary | ICD-10-CM | POA: Diagnosis not present

## 2024-06-29 DIAGNOSIS — R2241 Localized swelling, mass and lump, right lower limb: Secondary | ICD-10-CM | POA: Diagnosis not present

## 2024-06-29 DIAGNOSIS — M79661 Pain in right lower leg: Secondary | ICD-10-CM | POA: Diagnosis not present

## 2024-07-05 ENCOUNTER — Other Ambulatory Visit: Payer: Self-pay | Admitting: Physician Assistant

## 2024-07-05 DIAGNOSIS — I482 Chronic atrial fibrillation, unspecified: Secondary | ICD-10-CM | POA: Diagnosis not present

## 2024-07-05 DIAGNOSIS — R2241 Localized swelling, mass and lump, right lower limb: Secondary | ICD-10-CM

## 2024-07-05 DIAGNOSIS — R1314 Dysphagia, pharyngoesophageal phase: Secondary | ICD-10-CM | POA: Diagnosis not present

## 2024-07-05 DIAGNOSIS — Z923 Personal history of irradiation: Secondary | ICD-10-CM | POA: Diagnosis not present

## 2024-07-05 DIAGNOSIS — Z7901 Long term (current) use of anticoagulants: Secondary | ICD-10-CM | POA: Diagnosis not present

## 2024-07-11 ENCOUNTER — Encounter: Admitting: Physician Assistant

## 2024-07-12 ENCOUNTER — Ambulatory Visit
Admission: RE | Admit: 2024-07-12 | Discharge: 2024-07-12 | Disposition: A | Discharge status: Home / Self Care | Source: Ambulatory Visit | Attending: Physician Assistant | Admitting: Physician Assistant

## 2024-07-12 DIAGNOSIS — R2241 Localized swelling, mass and lump, right lower limb: Secondary | ICD-10-CM | POA: Diagnosis not present

## 2024-07-12 MED ORDER — GADOBUTROL 1 MMOL/ML IV SOLN
6.0000 mL | Freq: Once | INTRAVENOUS | Status: AC | PRN
  Administered 2024-07-12: 6 mL via INTRAVENOUS

## 2024-07-14 DIAGNOSIS — C3492 Malignant neoplasm of unspecified part of left bronchus or lung: Secondary | ICD-10-CM | POA: Diagnosis not present

## 2024-07-14 DIAGNOSIS — C3412 Malignant neoplasm of upper lobe, left bronchus or lung: Secondary | ICD-10-CM | POA: Diagnosis not present

## 2024-07-14 DIAGNOSIS — I420 Dilated cardiomyopathy: Secondary | ICD-10-CM | POA: Diagnosis not present

## 2024-07-14 DIAGNOSIS — I482 Chronic atrial fibrillation, unspecified: Secondary | ICD-10-CM | POA: Diagnosis not present

## 2024-07-14 DIAGNOSIS — Z23 Encounter for immunization: Secondary | ICD-10-CM | POA: Diagnosis not present

## 2024-07-14 DIAGNOSIS — Z7901 Long term (current) use of anticoagulants: Secondary | ICD-10-CM | POA: Diagnosis not present

## 2024-07-26 NOTE — Anesthesia Preprocedure Evaluation (Signed)
 Anesthesia Evaluation  Patient identified by MRN, date of birth, ID band Patient awake    Reviewed: Allergy & Precautions, NPO status , Patient's Chart, lab work & pertinent test results  History of Anesthesia Complications Negative for: history of anesthetic complications  Airway Mallampati: III  TM Distance: >3 FB Neck ROM: full    Dental  (+) Chipped, Dental Advidsory Given   Pulmonary COPD, former smoker History of squamous cell carcinoma of the lung - difficulty swallowing and a persistent cough since undergoing radiation treatment for lung cancer in 2023   Pulmonary exam normal breath sounds clear to auscultation       Cardiovascular hypertension, + CAD, +CHF and + DOE  + dysrhythmias Atrial Fibrillation  Rhythm:Irregular Rate:Normal  ECG 05/16/22:  Atrial fibrillation with premature ventricular or aberrantly conducted complexes Right bundle branch block  Echo 08/30/20:  MILD LV SYSTOLIC DYSFUNCTION  NORMAL RIGHT VENTRICULAR SYSTOLIC FUNCTION  MILD VALVULAR REGURGITATION   NO VALVULAR STENOSIS  MILD MR, TR  EF 45-50%    Neuro/Psych negative neurological ROS  negative psych ROS   GI/Hepatic Neg liver ROS,GERD  Medicated and Controlled,,  Endo/Other  negative endocrine ROS    Renal/GU Renal disease     Musculoskeletal   Abdominal Normal abdominal exam  (+)   Peds  Hematology negative hematology ROS (+)   Anesthesia Other Findings Cardiology visit 9/25: reviewed  Past Medical History: No date: A-fib [facility])     Comment:  a.) CHA2DS2-VASc = 6 (age x2, HTN, DVT x2, vascular               disease history). b.) rate/rhythm maintained on oral               carvedilol ; chronically anticoagulated using daily               warfarin. No date: Actinic keratosis No date: Adenomatous polyp No date: Allergic rhinitis No date: Anemia No date: Anticoagulated on warfarin No date: Aortic atherosclerosis (HCC) No  date: Benign essential hypertension No date: Bilateral renal cysts No date: Bladder calculi No date: BPH with urinary obstruction No date: CAD (coronary artery disease) No date: Chronic venous insufficiency No date: DDD (degenerative disc disease), lumbar     Comment:  a.) s/p LEFT laminectomy with L4-S1 decompression No date: Diverticulosis No date: DOE (dyspnea on exertion) No date: DVT (deep venous thrombosis) (HCC) No date: Elevated PSA No date: GERD (gastroesophageal reflux disease) No date: HLD (hyperlipidemia) No date: Lipoma of arm No date: Lipomatosis 11/22/2015: NICM (nonischemic cardiomyopathy) (HCC)     Comment:  a.) TTE 11/22/2015: EF 40%. b.) TTE 12/27/2018: EF 45%.               c.) TTE 08/30/2020: EF 45-50%. No date: Night sweats No date: Obesity No date: Osteoarthritis No date: Pulmonary emphysema (HCC) No date: RBBB (right bundle branch block) No date: Right inguinal hernia 04/24/2022: Right renal mass     Comment:  a.) CT A/P 04/24/22: 2.7 x 2.8 cm enhancing septation               lateral aspect RIGHT kidney concerning for cystic renal               neoplasm; b.) CT chest 05/15/22: 3 cm RIGHT renal mass;               c.) CT A/P 08/17/22: 2.9 x 2.6 cm exophytic mass; d.) CT  A/P 11/19/22: 3.0 x 2.6 cm solid & cystic mass peripheral              superior RIGHT renal pole c/w RCC (Bosniak IV); e.) CT               A/P 03/30/23: 3.1 x 2.6 cm; f.) CT A/P 07/21/23: 3.3 x               2.9 cm 04/24/2022: Squamous cell lung cancer (HCC)     Comment:  a.)  CT chest 04/24/2022: Multilobulated perihilar mass               measuring 5.3 x 3.7 x 2.8 cm with associated LEFT hilar               and mediastinal LAD; b.)  PET CT 05/07/2022:               Hypermetabolic LEFT upper lobe lung mass (max SUV 15.1);               ipsilateral nodal metastasis --> presuming a NSC               histology, consistent with stage IIIb pulmonary neoplasm               (T3  N2 M0) --> path (+) NSCLC (SCC) No date: Thoracic spondylosis 11/22/2015: VHD (valvular heart disease)     Comment:  a.) TTE 11/22/2015: EF 40%; mod MR, mod-sev TR, triv PR;              mod BAE, mild RV enlargement. b.) TTE 12/27/2018: EF 45%;              mod MR, mod-sev TR, triv PR; mod BAE, mild RV               enlargement. c.) TTE 08/30/2020: EF 45-50%; mild MR/TR.  Past Surgical History: 1997: Bilateral Radical Keratotomy; Bilateral 01/11/2016: COLONOSCOPY WITH PROPOFOL ; N/A     Comment:  Procedure: COLONOSCOPY WITH PROPOFOL ;  Surgeon: Lamar ONEIDA Holmes, MD;  Location: [facility] ENDOSCOPY;  Service:               Endoscopy;  Laterality: N/A; repeat 3 years, tubular               adenoma No date: DENTAL SURGERY     Comment:  Patient had implants 05/19/2022: FLEXIBLE BRONCHOSCOPY; Left     Comment:  Procedure: FLEXIBLE BRONCHOSCOPY;  Surgeon: Tamea Dedra CROME, MD;  Location: [facility] ORS;  Service:               Cardiopulmonary;  Laterality: Left; No date: GUM SURGERY 06/13/2022: IR IMAGING GUIDED PORT INSERTION 10/06/2023: IR RADIOLOGIST EVAL & MGMT No date: L4 - S1 decompression; Left No date: PROSTATE BIOPSY age 89: TONSILLECTOMY AND ADENOIDECTOMY 05/19/2022: VIDEO BRONCHOSCOPY WITH ENDOBRONCHIAL ULTRASOUND; Left     Comment:  Procedure: VIDEO BRONCHOSCOPY WITH ENDOBRONCHIAL               ULTRASOUND;  Surgeon: Tamea Dedra CROME, MD;  Location:               [facility] ORS;  Service: Cardiopulmonary;  Laterality: Left; 10/21/2021: XI ROBOTIC ASSISTED INGUINAL HERNIA REPAIR WITH MESH;  Right     Comment:  Procedure: XI ROBOTIC ASSISTED INGUINAL HERNIA REPAIR  WITH MESH;  Surgeon: Lane Shope, MD;  Location:               [facility] ORS;  Service: General;  Laterality: Right;  BMI    Body Mass Index: 22.99 kg/m      Reproductive/Obstetrics negative OB ROS                              Anesthesia Physical Anesthesia  Plan  ASA: 3  Anesthesia Plan: General   Post-op Pain Management:    Induction: Intravenous  PONV Risk Score and Plan: TIVA  Airway Management Planned: Natural Airway  Additional Equipment:   Intra-op Plan:   Post-operative Plan:   Informed Consent: I have reviewed the patients History and Physical, chart, labs and discussed the procedure including the risks, benefits and alternatives for the proposed anesthesia with the patient or authorized representative who has indicated his/her understanding and acceptance.     Dental Advisory Given  Plan Discussed with: Anesthesiologist, CRNA and Surgeon  Anesthesia Plan Comments:         Anesthesia Quick Evaluation

## 2024-07-27 ENCOUNTER — Encounter: Payer: Self-pay | Admitting: Anesthesiology

## 2024-07-27 ENCOUNTER — Ambulatory Visit
Admission: RE | Admit: 2024-07-27 | Discharge: 2024-07-27 | Disposition: A | Attending: Gastroenterology | Admitting: Gastroenterology

## 2024-07-27 ENCOUNTER — Encounter
Admission: RE | Disposition: A | Discharge status: Home / Self Care | Payer: Self-pay | Source: Home / Self Care | Attending: Gastroenterology

## 2024-07-27 ENCOUNTER — Ambulatory Visit: Payer: Self-pay | Admitting: Anesthesiology

## 2024-07-27 ENCOUNTER — Encounter: Payer: Self-pay | Admitting: Gastroenterology

## 2024-07-27 DIAGNOSIS — I4891 Unspecified atrial fibrillation: Secondary | ICD-10-CM | POA: Diagnosis not present

## 2024-07-27 DIAGNOSIS — I5022 Chronic systolic (congestive) heart failure: Secondary | ICD-10-CM | POA: Diagnosis not present

## 2024-07-27 DIAGNOSIS — Z87891 Personal history of nicotine dependence: Secondary | ICD-10-CM | POA: Diagnosis not present

## 2024-07-27 DIAGNOSIS — K449 Diaphragmatic hernia without obstruction or gangrene: Secondary | ICD-10-CM | POA: Diagnosis not present

## 2024-07-27 DIAGNOSIS — I2510 Atherosclerotic heart disease of native coronary artery without angina pectoris: Secondary | ICD-10-CM | POA: Diagnosis not present

## 2024-07-27 DIAGNOSIS — Z923 Personal history of irradiation: Secondary | ICD-10-CM | POA: Diagnosis not present

## 2024-07-27 DIAGNOSIS — R1314 Dysphagia, pharyngoesophageal phase: Secondary | ICD-10-CM | POA: Diagnosis not present

## 2024-07-27 DIAGNOSIS — Z85118 Personal history of other malignant neoplasm of bronchus and lung: Secondary | ICD-10-CM | POA: Diagnosis not present

## 2024-07-27 DIAGNOSIS — I110 Hypertensive heart disease with heart failure: Secondary | ICD-10-CM | POA: Diagnosis not present

## 2024-07-27 DIAGNOSIS — R1310 Dysphagia, unspecified: Secondary | ICD-10-CM | POA: Diagnosis not present

## 2024-07-27 DIAGNOSIS — J449 Chronic obstructive pulmonary disease, unspecified: Secondary | ICD-10-CM | POA: Diagnosis not present

## 2024-07-27 HISTORY — PX: ESOPHAGOGASTRODUODENOSCOPY: SHX5428

## 2024-07-27 SURGERY — EGD (ESOPHAGOGASTRODUODENOSCOPY)
Anesthesia: General

## 2024-07-27 MED ORDER — ONDANSETRON HCL 4 MG/2ML IJ SOLN
INTRAMUSCULAR | Status: DC | PRN
  Administered 2024-07-27: 4 mg via INTRAVENOUS

## 2024-07-27 MED ORDER — GLYCOPYRROLATE 0.2 MG/ML IJ SOLN
INTRAMUSCULAR | Status: DC | PRN
  Administered 2024-07-27: .1 mg via INTRAVENOUS

## 2024-07-27 MED ORDER — LIDOCAINE HCL (CARDIAC) PF 100 MG/5ML IV SOSY
PREFILLED_SYRINGE | INTRAVENOUS | Status: DC | PRN
  Administered 2024-07-27: 40 mg via INTRAVENOUS

## 2024-07-27 MED ORDER — SODIUM CHLORIDE 0.9 % IV SOLN
INTRAVENOUS | Status: DC
  Administered 2024-07-27: 20 mL/h via INTRAVENOUS

## 2024-07-27 MED ORDER — PROPOFOL 10 MG/ML IV BOLUS
INTRAVENOUS | Status: DC | PRN
  Administered 2024-07-27: 20 mg via INTRAVENOUS
  Administered 2024-07-27: 35 mg via INTRAVENOUS

## 2024-07-27 NOTE — H&P (Signed)
 Ruel Kung , MD [address], [address], [place], [place], [address] Phone: [phone] Fax: [phone]  Primary Care Physician:  Bertrum Charlie CROME, MD   Pre-Procedure History & Physical: HPI:  Casey Gallegos is a 89 y.o. male is here for an endoscopy    Past Medical History:  Diagnosis Date   A-fib [facility])    a.) CHA2DS2-VASc = 37 (age x2, HTN, DVT x2, vascular disease history). b.) rate/rhythm maintained on oral carvedilol ; chronically anticoagulated using daily warfarin.   Actinic keratosis    Adenomatous polyp    Allergic rhinitis    Anemia    Anticoagulated on warfarin    Aortic atherosclerosis    Benign essential hypertension    Bilateral renal cysts    Bladder calculi    BPH with urinary obstruction    CAD (coronary artery disease)    Chronic venous insufficiency    DDD (degenerative disc disease), lumbar    a.) s/p LEFT laminectomy with L4-S1 decompression   Diverticulosis    DOE (dyspnea on exertion)    DVT (deep venous thrombosis) (HCC)    Elevated PSA    GERD (gastroesophageal reflux disease)    HLD (hyperlipidemia)    Lipoma of arm    Lipomatosis    NICM (nonischemic cardiomyopathy) (HCC) 11/22/2015   a.) TTE 11/22/2015: EF 40%. b.) TTE 12/27/2018: EF 45%. c.) TTE 08/30/2020: EF 45-50%.   Night sweats    Obesity    Osteoarthritis    Pulmonary emphysema (HCC)    RBBB (right bundle branch block)    Right inguinal hernia    Right renal mass 04/24/2022   a.) CT A/P 04/24/22: 2.7 x 2.8 cm enhancing septation lateral aspect RIGHT kidney concerning for cystic renal neoplasm; b.) CT chest 05/15/22: 3 cm RIGHT renal mass; c.) CT A/P 08/17/22: 2.9 x 2.6 cm exophytic mass; d.) CT A/P 11/19/22: 3.0 x 2.6 cm solid & cystic mass peripheral superior RIGHT renal pole c/w RCC (Bosniak IV); e.) CT A/P 03/30/23: 3.1 x 2.6 cm; f.) CT A/P 07/21/23: 3.3 x 2.9 cm   Squamous cell lung cancer (HCC) 04/24/2022   a.)  CT chest 04/24/2022: Multilobulated perihilar mass measuring  5.3 x 3.7 x 2.8 cm with associated LEFT hilar and mediastinal LAD; b.)  PET CT 05/07/2022: Hypermetabolic LEFT upper lobe lung mass (max SUV 15.1); ipsilateral nodal metastasis --> presuming a NSC histology, consistent with stage IIIb pulmonary neoplasm (T3 N2 M0) --> path (+) NSCLC (SCC)   Thoracic spondylosis    VHD (valvular heart disease) 11/22/2015   a.) TTE 11/22/2015: EF 40%; mod MR, mod-sev TR, triv PR; mod BAE, mild RV enlargement. b.) TTE 12/27/2018: EF 45%; mod MR, mod-sev TR, triv PR; mod BAE, mild RV enlargement. c.) TTE 08/30/2020: EF 45-50%; mild MR/TR.    Past Surgical History:  Procedure Laterality Date   Bilateral Radical Keratotomy Bilateral 1997   COLONOSCOPY WITH PROPOFOL  N/A 01/11/2016   Procedure: COLONOSCOPY WITH PROPOFOL ;  Surgeon: Lamar ONEIDA Holmes, MD;  Location: [facility] ENDOSCOPY;  Service: Endoscopy;  Laterality: N/A; repeat 3 years, tubular adenoma   DENTAL SURGERY     Patient had implants   FLEXIBLE BRONCHOSCOPY Left 05/19/2022   Procedure: FLEXIBLE BRONCHOSCOPY;  Surgeon: Tamea Dedra CROME, MD;  Location: [facility] ORS;  Service: Cardiopulmonary;  Laterality: Left;   GUM SURGERY     IR IMAGING GUIDED PORT INSERTION  06/13/2022   IR RADIOLOGIST EVAL & MGMT  10/06/2023   IR RADIOLOGIST EVAL & MGMT  11/19/2023   L4 - S1 decompression Left    PROSTATE BIOPSY     TONSILLECTOMY AND ADENOIDECTOMY  age 89   VIDEO BRONCHOSCOPY WITH ENDOBRONCHIAL ULTRASOUND Left 05/19/2022   Procedure: VIDEO BRONCHOSCOPY WITH ENDOBRONCHIAL ULTRASOUND;  Surgeon: Tamea Dedra CROME, MD;  Location: [facility] ORS;  Service: Cardiopulmonary;  Laterality: Left;   XI ROBOTIC ASSISTED INGUINAL HERNIA REPAIR WITH MESH Right 10/21/2021   Procedure: XI ROBOTIC ASSISTED INGUINAL HERNIA REPAIR WITH MESH;  Surgeon: Lane Shope, MD;  Location: [facility] ORS;  Service: General;  Laterality: Right;    Prior to Admission medications   Medication Sig Start Date End Date Taking? Authorizing Provider  amLODipine   (NORVASC ) 5 MG tablet TAKE 1 TABLET (5 MG TOTAL) BY MOUTH DAILY. 09/19/22   Simmons-Robinson, Rockie, MD  benazepril  (LOTENSIN ) 40 MG tablet Take 1 tablet (40 mg total) by mouth daily. 12/22/22   Simmons-Robinson, Makiera, MD  carvedilol  (COREG ) 6.25 MG tablet TAKE 1 TABLET (6.25 MG TOTAL) BY MOUTH 2 (TWO) TIMES DAILY. 09/19/22   Simmons-Robinson, Rockie, MD  Cyanocobalamin  (VITAMIN B 12 PO) Take 1 capsule by mouth 2 (two) times daily. 1000 mcg each capsule    [provider]  doxazosin  (CARDURA ) 8 MG tablet Take 8 mg by mouth at bedtime.    [provider]  finasteride  (PROSCAR ) 5 MG tablet TAKE 1 TABLET BY MOUTH EVERY DAY 04/19/24   McGowan, Clotilda A, PA-C  gabapentin  (NEURONTIN ) 300 MG capsule TAKE 1 CAPSULE EVERY MORNING, 1 CAPSULE IN THE AFTERNOON AND 2 CAPSULES AT BEDTIME 04/08/23   Simmons-Robinson, Makiera, MD  ketoconazole  (NIZORAL ) 2 % cream Apply to dry skin on his forehead and nose qd-bid 06/27/24   Jackquline Sawyer, MD  Magnesium 500 MG TABS Take 500 mg by mouth daily.    [provider]  mometasone  (ELOCON ) 0.1 % cream Apply 1 Application topically as directed. Mometasone  cream bid up to 5 days per week (M-F only) aa buttocks 03/29/24   Hester Alm BROCKS, MD  Multiple Vitamin (MULTI-VITAMIN) tablet Take 1 tablet by mouth daily.     [provider]  oxyCODONE  (OXY IR/ROXICODONE ) 5 MG immediate release tablet Take 1 tablet (5 mg total) by mouth every 6 (six) hours as needed for severe pain. 12/03/22   Simmons-Robinson, Rockie, MD  tacrolimus  (PROTOPIC ) 0.1 % ointment Apply to irritated area on the buttock qd-bid 03/28/24   Hester Alm BROCKS, MD  triamterene -hydrochlorothiazide (MAXZIDE-25) 37.5-25 MG tablet TAKE 1/2 TABLET EVERY DAY 03/05/23   Simmons-Robinson, Rockie, MD  warfarin (COUMADIN ) 5 MG tablet TAKE 1 TABLET EVERY DAY 03/30/23   Simmons-Robinson, Rockie, MD    Allergies as of 07/07/2024 - Review Complete 06/27/2024  Allergen Reaction Noted   Cefdinir   Nausea Only 04/29/2016   Doxycycline   02/28/2015   Prednisone  01/10/2016   Sertraline  Other (See Comments) 04/29/2016    Family History  Problem Relation Age of Onset   Cancer Mother        secondary to cancinoma of the jaw from her dipping snuff.   Heart attack Father    CAD Father    Hypertension Sister    Diabetes Son        Type I diabetes   Prostate cancer Neg Hx    Kidney cancer Neg Hx     Social History   Socioeconomic History   Marital status: Married    Spouse name: Doris   Number of children: 3   Years of education: Not on file   Highest education level:  Associate degree: occupational, technical, or vocational program  Occupational History   Occupation: retired  Tobacco Use   Smoking status: Former    Current packs/day: 0.00    Types: Cigarettes    Quit date: 08/26/1971    Years since quitting: 52.9    Passive exposure: Never   Smokeless tobacco: Never   Tobacco comments:    Smoked for about 20 years.  Vaping Use   Vaping status: Never Used  Substance and Sexual Activity   Alcohol use: Not Currently   Drug use: No   Sexual activity: Not Currently  Other Topics Concern   Not on file  Social History Narrative   Not on file   Social Drivers of Health   Financial Resource Strain: Low Risk  (02/02/2024)   Received from [facility]   Overall Financial Resource Strain (CARDIA)    Difficulty of Paying Living Expenses: Not hard at all  Food Insecurity: No Food Insecurity (02/02/2024)   Received from [facility]   Hunger Vital Sign    Within the past 12 months, you worried that your food would run out before you got the money to buy more.: Never true    Within the past 12 months, the food you bought just didn't last and you didn't have money to get more.: Never true  Transportation Needs: No Transportation Needs (02/02/2024)   Received from [facility] - Transportation    In the past 12 months,  has lack of transportation kept you from medical appointments or from getting medications?: No    Lack of Transportation (Non-Medical): No  Physical Activity: Sufficiently Active (12/23/2022)   Exercise Vital Sign    Days of Exercise per Week: 7 days    Minutes of Exercise per Session: 50 min  Stress: No Stress Concern Present (12/18/2021)   [employer] of Occupational [employer] - Occupational Stress Questionnaire    Feeling of Stress : Only a little  Social Connections: Moderately Integrated (12/23/2022)   Social Connection and Isolation Panel    Frequency of Communication with Friends and Family: Twice a week    Frequency of Social Gatherings with Friends and Family: Once a week    Attends Religious Services: More than 4 times per year    Active Member of [employer] or Organizations: No    Attends Banker Meetings: Never    Marital Status: Married  Catering Manager Violence: Not At Risk (12/23/2022)   Humiliation, Afraid, Rape, and Kick questionnaire    Fear of Current or Ex-Partner: No    Emotionally Abused: No    Physically Abused: No    Sexually Abused: No    Review of Systems: See HPI, otherwise negative ROS  Physical Exam: There were no vitals taken for this visit. General:   Alert,  pleasant and cooperative in NAD Head:  Normocephalic and atraumatic. Neck:  Supple; no masses or thyromegaly. Lungs:  Clear throughout to auscultation, normal respiratory effort.    Heart:  +S1, +S2, Regular rate and rhythm, No edema. Abdomen:  Soft, nontender and nondistended. Normal bowel sounds, without guarding, and without rebound.   Neurologic:  Alert and  oriented x4;  grossly normal neurologically.  Impression/Plan: Casey Gallegos is here for an endoscopy  to be performed for  evaluation of Dysphagia.     Risks, benefits, limitations, and alternatives regarding endoscopy have been reviewed with the patient.  Questions have been answered.  All parties agreeable.  Ruel Kung, MD  07/27/2024, 8:53 AM

## 2024-07-27 NOTE — Op Note (Signed)
 [facility] Gastroenterology Patient Name: Casey Gallegos Procedure Date: 07/27/2024 9:18 AM MRN: [id number] Account #: [id number] Date of Birth: 02/25/1935 Admit Type: Outpatient Age: 89 Room: [facility] ENDO ROOM 3 Gender: Male Note Status: Finalized Instrument Name: Upper GI Scope [phone] Procedure:             Upper GI endoscopy Indications:           Dysphagia Providers:             Ruel Kung MD, MD Referring MD:          Charlie CROME. Bertrum, MD (Referring MD) Medicines:             Monitored Anesthesia Care Complications:         No immediate complications. Procedure:             Pre-Anesthesia Assessment:                        - Prior to the procedure, a History and Physical was                         performed, and patient medications, allergies and                         sensitivities were reviewed. The patient's tolerance                         of previous anesthesia was reviewed.                        - The risks and benefits of the procedure and the                         sedation options and risks were discussed with the                         patient. All questions were answered and informed                         consent was obtained.                        - ASA Grade Assessment: II - A patient with mild                         systemic disease.                        After obtaining informed consent, the endoscope was                         passed under direct vision. Throughout the procedure,                         the patient's blood pressure, pulse, and oxygen                          saturations were monitored continuously. The Endoscope                         was  introduced through the mouth, and advanced to the                         third part of duodenum. The upper GI endoscopy was                         accomplished with ease. The patient tolerated the                         procedure well. Findings:      The examined duodenum was  normal.      A medium-sized hiatal hernia was present.      No appreciable esophageal motility was noted. In addition, a patulous       lower esophageal sphincter was found. There was no resistance to       endoscope advancement into the stomach. The Z-line was regular. The       gastroesophageal junction and cardia were normal on retroflexed view.       Biopsies were taken with a cold forceps for histology. Impression:            - Normal examined duodenum.                        - Medium-sized hiatal hernia.                        - The examination was suspicious for aperistalsis.                         Biopsied. Recommendation:        - Await pathology results.                        - Discharge patient to home (with escort).                        - Resume previous diet.                        - Continue present medications.                        - Resume warfarin today                        At office visitr in 6-8 weeks will discuss further                         options of testing such as barium and manometry . Will                         also plan to review any medications contributing to                         the issue Procedure Code(s):     --- Professional ---                        [phone], Esophagogastroduodenoscopy, flexible,  transoral; with biopsy, single or multiple Diagnosis Code(s):     --- Professional ---                        K44.9, Diaphragmatic hernia without obstruction or                         gangrene                        R13.10, Dysphagia, unspecified CPT copyright 2022 American Medical Association. All rights reserved. The codes documented in this report are preliminary and upon coder review may  be revised to meet current compliance requirements. Ruel Kung, MD Ruel Kung MD, MD 07/27/2024 9:37:39 AM This report has been signed electronically. Number of Addenda: 0 Note Initiated On: 07/27/2024 9:18 AM Estimated Blood  Loss:  Estimated blood loss: none.      [facility]

## 2024-07-27 NOTE — Transfer of Care (Signed)
 Immediate Anesthesia Transfer of Care Note  [name]  Procedure(s) Performed: EGD (ESOPHAGOGASTRODUODENOSCOPY)  Patient Location: PACU  Anesthesia Type:MAC  Level of Consciousness: drowsy  Airway & Oxygen  Therapy: Patient Spontanous Breathing and Patient connected to nasal cannula oxygen   Post-op Assessment: Report given to RN, Post -op Vital signs reviewed and stable, and Patient moving all extremities  Post vital signs: Reviewed and stable  Last Vitals:  Vitals Value Taken Time  BP    Temp    Pulse    Resp    SpO2      Last Pain:  Vitals:   07/27/24 0902  TempSrc: Temporal  PainSc: 0-No pain         Complications: No notable events documented.

## 2024-07-27 NOTE — Anesthesia Postprocedure Evaluation (Signed)
 Anesthesia Post Note  [name]  Procedure(s) Performed: EGD (ESOPHAGOGASTRODUODENOSCOPY)  Patient location during evaluation: Endoscopy Anesthesia Type: General Level of consciousness: awake and alert Pain management: pain level controlled Vital Signs Assessment: post-procedure vital signs reviewed and stable Respiratory status: spontaneous breathing, nonlabored ventilation and respiratory function stable Cardiovascular status: blood pressure returned to baseline and stable Postop Assessment: no apparent nausea or vomiting Anesthetic complications: no   No notable events documented.   Last Vitals:  Vitals:   07/27/24 0940 07/27/24 0948  BP: 132/73 105/77  Pulse: 62 68  Resp: 13 (!) 21  Temp:    SpO2: 98% 98%    Last Pain:  Vitals:   07/27/24 0948  TempSrc:   PainSc: 0-No pain                 Camellia Merilee Louder

## 2024-07-28 LAB — SURGICAL PATHOLOGY

## 2024-08-01 ENCOUNTER — Ambulatory Visit: Payer: Self-pay | Admitting: Surgery

## 2024-08-01 ENCOUNTER — Ambulatory Visit
Admission: RE | Admit: 2024-08-01 | Discharge: 2024-08-01 | Disposition: A | Source: Ambulatory Visit | Attending: Oncology

## 2024-08-01 DIAGNOSIS — Z08 Encounter for follow-up examination after completed treatment for malignant neoplasm: Secondary | ICD-10-CM

## 2024-08-01 DIAGNOSIS — R2241 Localized swelling, mass and lump, right lower limb: Secondary | ICD-10-CM | POA: Diagnosis not present

## 2024-08-01 MED ORDER — IOHEXOL 300 MG/ML  SOLN
85.0000 mL | Freq: Once | INTRAMUSCULAR | Status: AC | PRN
  Administered 2024-08-01: 85 mL via INTRAVENOUS

## 2024-08-01 NOTE — H&P (View-Only) (Signed)
 Subjective:   CC: Mass of right lower leg [R22.41]  HPI:  referred by Andre Cherylynn Dustman III, PA for evaluation of above.   History of Present Illness Casey Gallegos is an 89 year old male who presents with a persistent sore spot on his leg.  He reports a sore spot on his leg for about three years with no relief despite evaluation by several physicians and multiple treatments. The area is painful when bumped, more swollen in the morning, and hurts more when he does not wear compression socks.    Past Medical History:  has a past medical history of Allergic rhinitis, Atrial fibrillation (CMS/HHS-HCC), Cardiomyopathy, secondary (CMS/HHS-HCC), DVT (deep venous thrombosis) (CMS/HHS-HCC) (2011), History of adenomatous polyp of colon (11/19/2015), History of pneumonia, Hyperlipidemia, Hypertension, Inguinal hernia, Obesity, Osteoarthritis, and VHD (valvular heart disease).  Past Surgical History:  has a past surgical history that includes BACK SURGERY; Colonoscopy (04/11/2005); Colonoscopy (01/11/2016); and Inguinal hernia repair.  Family History: family history includes Cancer in his mother; Diabetes in his son; Myocardial Infarction (Heart attack) in his father; No Known Problems in his sister and sister.  Social History:  reports that he quit smoking about 40 years ago. His smoking use included cigarettes. He has never used smokeless tobacco. He reports that he does not drink alcohol and does not use drugs.  Current Medications: has a current medication list which includes the following prescription(s): amlodipine , benazepril , carvedilol , cyanocobalamin  (vitamin b-12), doxazosin , doxazosin , finasteride , gabapentin , magnesium oxide, multivitamin, oxycodone , oxycodone -acetaminophen , triamcinolone , triamterene -hydrochlorothiazide, warfarin, warfarin, and warfarin.  Allergies:  Allergies  Allergen Reactions   Omnicef  [Cefdinir ] Nausea   Prednisone Hallucination   Zoloft  [Sertraline ] Hallucination     ROS:  A 15 point review of systems was performed and pertinent positives and negatives noted in HPI   Objective:     BP (!) 163/88   Pulse 72   Ht 172.7 cm (5' 8)   Wt 68.9 kg (152 lb)   BMI 23.11 kg/m   Constitutional :  No distress, cooperative, alert  Lymphatics/Throat:  Supple with no lymphadenopathy  Respiratory:  Clear to auscultation bilaterally  Cardiovascular:  Regular rate and rhythm  Gastrointestinal: Soft, non-tender, non-distended, no organomegaly.  Musculoskeletal: Steady gait and movement  Skin: Cool and moist, large marble size lesion, left lower leg, slightly lateral to tibial ridge, minimal chronic dark skin discoloration around it. No TTP, somewhat mobile and smooth.  Psychiatric: Normal affect, non-agitated, not confused         LABS:  N/a   RADS: EXAM: MR RIGHT LOWER EXTREMITY WITH AND WITHOUT INTRAVENOUS CONTRAST 07/12/2024 04:13:35 PM  TECHNIQUE: Multiplanar magnetic resonance images of the right lower extremity with and without intravenous contrast.  CONTRAST: 6 mL of Gadavist .  COMPARISON: Radiographs 06/29/2024.  CLINICAL HISTORY: Right lateral leg mass for 2 years, area of interested indicated with markers.  FINDINGS:  SOFT TISSUES: Bilateral circumferential subcutaneous edema in both calves. Edema tracks deep to the medial head of the gastrocnemius adjacent to the plantaris tendon. Moderately atrophic tibialis anterior muscle. Distal achilles tendinopathy with mild fusiform expansion. Ovoid enhancing 1.8 x 1.0 x 1.9 cm subcutaneous lesion corresponding to the palpable abnormality with accurately defined margins, accentuated CT signal, and intermediate T1 signal as shown on image 33 series 13. Small adjacent venous or lymphatic channels tangential to this lesion. Enhancement is homogeneous and diffuse. Just cephalad to this lesion, there is some adjacent mildly accentuated subcutaneous infiltrative enhancement as on image 27  series 13. Possibilities for the  lesion include vascular malformation, peripheral nerve sheath tumor, or desmoid tumor. Entities such as soft tissue sarcoma or cutaneous lymphoma considered substantially less likely. If the mass is reducible on palpation, then it could be a venous varix. The superficial margin of the mass is 0.2 cm deep to the cutaneous surface. Tissue diagnosis may be indicated.  JOINTS: Unremarkable. No dislocation or significant effusion.  BONES: No acute fracture or focal osseous lesion.  IMPRESSION: 1. Ovoid enhancing 1.8 x 1.0 x 1.9 cm subcutaneous lesion in the right lateral leg corresponding to the palpable abnormality, with homogeneous diffuse enhancement and small adjacent venous or lymphatic channels, with mildly accentuated subcutaneous infiltrative enhancement just cephalad; differential considerations include vascular malformation, peripheral nerve sheath tumor, lymph node, or desmoid tumor; entities such as soft tissue sarcoma or cutaneous lymphoma are substantially less likely, if the mass is reducible on palpation it could be a venous varix. Tissue diagnosis may be indicated. 2. Bilateral circumferential subcutaneous edema in both calves, tracking deep to the medial head of the gastrocnemius adjacent to the plantaris tendon. 3. Moderately atrophic tibialis anterior muscle. 4. Distal Achilles tendinopathy with mild fusiform expansion.  Electronically signed by: Ryan Salvage MD 07/15/2024 09:34 AM EST RP Workstation: [id number]   Assessment:      Mass of right lower leg [R22.41]- will proceed with excisional biopsy and also to relieve associated symptoms.  Plan:     1. Mass of right lower leg [R22.41] Discussed surgical excision.  Alternatives include continued observation.  Benefits include possible symptom relief, pathologic evaluation, improved cosmesis. Discussed the risk of surgery including recurrence, chronic pain, post-op infxn, poor  cosmesis, poor/delayed wound healing, and possible re-operation to address said risks. The risks of general anesthetic, if used, includes MI, CVA, sudden death or even reaction to anesthetic medications also discussed.  Typical post-op recovery time of 3-5 days with possible activity restrictions were also discussed.  The patient verbalized understanding and all questions were answered to the patient's satisfaction.  2. Patient has elected to proceed with surgical treatment. Procedure will be scheduled. Supine position.  Hold coumadin  per pCP  labs/images/medications/previous chart entries reviewed personally and relevant changes/updates noted above.

## 2024-08-01 NOTE — H&P (Signed)
 Subjective:   CC: Mass of right lower leg [R22.41]  HPI:  referred by Andre Cherylynn Dustman III, PA for evaluation of above.   History of Present Illness Casey Gallegos is an 89 year old male who presents with a persistent sore spot on his leg.  He reports a sore spot on his leg for about three years with no relief despite evaluation by several physicians and multiple treatments. The area is painful when bumped, more swollen in the morning, and hurts more when he does not wear compression socks.    Past Medical History:  has a past medical history of Allergic rhinitis, Atrial fibrillation (CMS/HHS-HCC), Cardiomyopathy, secondary (CMS/HHS-HCC), DVT (deep venous thrombosis) (CMS/HHS-HCC) (2011), History of adenomatous polyp of colon (11/19/2015), History of pneumonia, Hyperlipidemia, Hypertension, Inguinal hernia, Obesity, Osteoarthritis, and VHD (valvular heart disease).  Past Surgical History:  has a past surgical history that includes BACK SURGERY; Colonoscopy (04/11/2005); Colonoscopy (01/11/2016); and Inguinal hernia repair.  Family History: family history includes Cancer in his mother; Diabetes in his son; Myocardial Infarction (Heart attack) in his father; No Known Problems in his sister and sister.  Social History:  reports that he quit smoking about 40 years ago. His smoking use included cigarettes. He has never used smokeless tobacco. He reports that he does not drink alcohol and does not use drugs.  Current Medications: has a current medication list which includes the following prescription(s): amlodipine , benazepril , carvedilol , cyanocobalamin  (vitamin b-12), doxazosin , doxazosin , finasteride , gabapentin , magnesium oxide, multivitamin, oxycodone , oxycodone -acetaminophen , triamcinolone , triamterene -hydrochlorothiazide, warfarin, warfarin, and warfarin.  Allergies:  Allergies  Allergen Reactions   Omnicef  [Cefdinir ] Nausea   Prednisone Hallucination   Zoloft  [Sertraline ] Hallucination     ROS:  A 15 point review of systems was performed and pertinent positives and negatives noted in HPI   Objective:     BP (!) 163/88   Pulse 72   Ht 172.7 cm (5' 8)   Wt 68.9 kg (152 lb)   BMI 23.11 kg/m   Constitutional :  No distress, cooperative, alert  Lymphatics/Throat:  Supple with no lymphadenopathy  Respiratory:  Clear to auscultation bilaterally  Cardiovascular:  Regular rate and rhythm  Gastrointestinal: Soft, non-tender, non-distended, no organomegaly.  Musculoskeletal: Steady gait and movement  Skin: Cool and moist, large marble size lesion, left lower leg, slightly lateral to tibial ridge, minimal chronic dark skin discoloration around it. No TTP, somewhat mobile and smooth.  Psychiatric: Normal affect, non-agitated, not confused         LABS:  N/a   RADS: EXAM: MR RIGHT LOWER EXTREMITY WITH AND WITHOUT INTRAVENOUS CONTRAST 07/12/2024 04:13:35 PM  TECHNIQUE: Multiplanar magnetic resonance images of the right lower extremity with and without intravenous contrast.  CONTRAST: 6 mL of Gadavist .  COMPARISON: Radiographs 06/29/2024.  CLINICAL HISTORY: Right lateral leg mass for 2 years, area of interested indicated with markers.  FINDINGS:  SOFT TISSUES: Bilateral circumferential subcutaneous edema in both calves. Edema tracks deep to the medial head of the gastrocnemius adjacent to the plantaris tendon. Moderately atrophic tibialis anterior muscle. Distal achilles tendinopathy with mild fusiform expansion. Ovoid enhancing 1.8 x 1.0 x 1.9 cm subcutaneous lesion corresponding to the palpable abnormality with accurately defined margins, accentuated CT signal, and intermediate T1 signal as shown on image 33 series 13. Small adjacent venous or lymphatic channels tangential to this lesion. Enhancement is homogeneous and diffuse. Just cephalad to this lesion, there is some adjacent mildly accentuated subcutaneous infiltrative enhancement as on image 27  series 13. Possibilities for the  lesion include vascular malformation, peripheral nerve sheath tumor, or desmoid tumor. Entities such as soft tissue sarcoma or cutaneous lymphoma considered substantially less likely. If the mass is reducible on palpation, then it could be a venous varix. The superficial margin of the mass is 0.2 cm deep to the cutaneous surface. Tissue diagnosis may be indicated.  JOINTS: Unremarkable. No dislocation or significant effusion.  BONES: No acute fracture or focal osseous lesion.  IMPRESSION: 1. Ovoid enhancing 1.8 x 1.0 x 1.9 cm subcutaneous lesion in the right lateral leg corresponding to the palpable abnormality, with homogeneous diffuse enhancement and small adjacent venous or lymphatic channels, with mildly accentuated subcutaneous infiltrative enhancement just cephalad; differential considerations include vascular malformation, peripheral nerve sheath tumor, lymph node, or desmoid tumor; entities such as soft tissue sarcoma or cutaneous lymphoma are substantially less likely, if the mass is reducible on palpation it could be a venous varix. Tissue diagnosis may be indicated. 2. Bilateral circumferential subcutaneous edema in both calves, tracking deep to the medial head of the gastrocnemius adjacent to the plantaris tendon. 3. Moderately atrophic tibialis anterior muscle. 4. Distal Achilles tendinopathy with mild fusiform expansion.  Electronically signed by: Ryan Salvage MD 07/15/2024 09:34 AM EST RP Workstation: [id number]   Assessment:      Mass of right lower leg [R22.41]- will proceed with excisional biopsy and also to relieve associated symptoms.  Plan:     1. Mass of right lower leg [R22.41] Discussed surgical excision.  Alternatives include continued observation.  Benefits include possible symptom relief, pathologic evaluation, improved cosmesis. Discussed the risk of surgery including recurrence, chronic pain, post-op infxn, poor  cosmesis, poor/delayed wound healing, and possible re-operation to address said risks. The risks of general anesthetic, if used, includes MI, CVA, sudden death or even reaction to anesthetic medications also discussed.  Typical post-op recovery time of 3-5 days with possible activity restrictions were also discussed.  The patient verbalized understanding and all questions were answered to the patient's satisfaction.  2. Patient has elected to proceed with surgical treatment. Procedure will be scheduled. Supine position.  Hold coumadin  per pCP  labs/images/medications/previous chart entries reviewed personally and relevant changes/updates noted above.

## 2024-08-04 ENCOUNTER — Inpatient Hospital Stay
Admission: RE | Admit: 2024-08-04 | Discharge: 2024-08-04 | Attending: Interventional Radiology | Admitting: Interventional Radiology

## 2024-08-04 ENCOUNTER — Encounter: Payer: Self-pay | Admitting: Oncology

## 2024-08-04 DIAGNOSIS — N2889 Other specified disorders of kidney and ureter: Secondary | ICD-10-CM

## 2024-08-04 HISTORY — PX: IR RADIOLOGIST EVAL & MGMT: IMG5224

## 2024-08-04 NOTE — Progress Notes (Signed)
 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  At the time of his initial workup, imaging also demonstrated a complex Bosniak 4 cyst exophytic from the posterolateral aspect of the upper pole of the right kidney.   He underwent concurrent chemoradiation therapy completed in December 2023 followed by durvalumab  beginning in January 2024.   On routine surveillance imaging the right renal mass was found to have slightly enlarged.  He was evaluated by Dr. Twylla of urology.  Naturally, given his advanced age and known lung malignancy he is a suboptimal surgical candidate for total or partial nephrectomy.   Casey Gallegos successfully underwent CT-guided biopsy and same time percutaneous thermal ablation on 11/02/2023.  His procedure was uneventful and he was discharged home the same day.  Pathology results which were consistent with a low-grade oncocytic tumor (LOT).  This is a benign/indolent tumor type and this biopsy diagnosis is very reassuring.   He and his wife come into the clinic to see me today for his 9 month follow-up evaluation.  Casey Gallegos reports that he is doing great.  He has had no pain, hematuria or any other symptoms.   CT CAP 02/12/24 - Interval contraction of exophytic cystic lesion along the margin of the RIGHT kidney following microwave ablation.  CT CAP 06/01/24 - Not yet officially read but by my review there is further contraction of the treated lesion and no evidence of recurrence.    Past Medical History:  Diagnosis Date   A-fib [facility])    a.) CHA2DS2-VASc = 6 (age x2, HTN, DVT x2, vascular disease history). b.) rate/rhythm maintained on oral  carvedilol ; chronically anticoagulated using daily warfarin.   Actinic keratosis    Adenomatous polyp    Allergic rhinitis    Anemia    Anticoagulated on warfarin    Aortic atherosclerosis    Benign essential hypertension    Bilateral renal cysts    Bladder calculi    BPH with urinary obstruction    CAD (coronary artery disease)    Chronic venous insufficiency    DDD (degenerative disc disease), lumbar    a.) s/p LEFT laminectomy with L4-S1 decompression   Diverticulosis    DOE (dyspnea on exertion)    DVT (deep venous thrombosis) (HCC)    Elevated PSA    GERD (gastroesophageal reflux disease)    HLD (hyperlipidemia)    Lipoma of arm    Lipomatosis    NICM (nonischemic cardiomyopathy) (HCC) 11/22/2015   a.) TTE 11/22/2015: EF 40%. b.) TTE 12/27/2018: EF 45%. c.) TTE 08/30/2020: EF 45-50%.   Night sweats    Obesity    Osteoarthritis    Pulmonary emphysema (HCC)    RBBB (right bundle branch block)    Right inguinal hernia    Right renal mass 04/24/2022   a.) CT A/P 04/24/22: 2.7 x 2.8 cm enhancing septation lateral aspect RIGHT kidney concerning for cystic renal neoplasm; b.) CT chest 05/15/22: 3 cm RIGHT renal mass; c.) CT A/P 08/17/22: 2.9 x 2.6 cm exophytic mass; d.) CT A/P 11/19/22: 3.0 x 2.6 cm solid & cystic mass peripheral superior RIGHT renal pole c/w RCC (Bosniak IV); e.) CT A/P 03/30/23: 3.1 x 2.6 cm;  f.) CT A/P 07/21/23: 3.3 x 2.9 cm   Squamous cell lung cancer (HCC) 04/24/2022   a.)  CT chest 04/24/2022: Multilobulated perihilar mass measuring 5.3 x 3.7 x 2.8 cm with associated LEFT hilar and mediastinal LAD; b.)  PET CT 05/07/2022: Hypermetabolic LEFT upper lobe lung mass (max SUV 15.1); ipsilateral nodal metastasis --> presuming a NSC histology, consistent with stage IIIb pulmonary neoplasm (T3 N2 M0) --> path (+) NSCLC (SCC)   Thoracic spondylosis    VHD (valvular heart disease) 11/22/2015   a.) TTE 11/22/2015: EF 40%; mod MR, mod-sev TR, triv PR; mod BAE, mild RV  enlargement. b.) TTE 12/27/2018: EF 45%; mod MR, mod-sev TR, triv PR; mod BAE, mild RV enlargement. c.) TTE 08/30/2020: EF 45-50%; mild MR/TR.    Past Surgical History:  Procedure Laterality Date   Bilateral Radical Keratotomy Bilateral 1997   COLONOSCOPY WITH PROPOFOL  N/A 01/11/2016   Procedure: COLONOSCOPY WITH PROPOFOL ;  Surgeon: Lamar ONEIDA Holmes, MD;  Location[facility] ENDOSCOPY;  Service: Endoscopy;  Laterality: N/A; repeat 3 years, tubular adenoma   DENTAL SURGERY     Patient had implants   ESOPHAGOGASTRODUODENOSCOPY N/A 07/27/2024   Procedure: EGD (ESOPHAGOGASTRODUODENOSCOPY);  Surgeon: Therisa Bi, MD;  Location: [facility] ENDOSCOPY;  Service: Gastroenterology;  Laterality: N/A;   FLEXIBLE BRONCHOSCOPY Left 05/19/2022   Procedure: FLEXIBLE BRONCHOSCOPY;  Surgeon: Tamea Dedra CROME, MD;  Location: [facility] ORS;  Service: Cardiopulmonary;  Laterality: Left;   GUM SURGERY     IR IMAGING GUIDED PORT INSERTION  06/13/2022   IR RADIOLOGIST EVAL & MGMT  10/06/2023   IR RADIOLOGIST EVAL & MGMT  11/19/2023   IR RADIOLOGIST EVAL & MGMT  08/04/2024   L4 - S1 decompression Left    PROSTATE BIOPSY     TONSILLECTOMY AND ADENOIDECTOMY  age 39   VIDEO BRONCHOSCOPY WITH ENDOBRONCHIAL ULTRASOUND Left 05/19/2022   Procedure: VIDEO BRONCHOSCOPY WITH ENDOBRONCHIAL ULTRASOUND;  Surgeon: Tamea Dedra CROME, MD;  Location: [facility] ORS;  Service: Cardiopulmonary;  Laterality: Left;   XI ROBOTIC ASSISTED INGUINAL HERNIA REPAIR WITH MESH Right 10/21/2021   Procedure: XI ROBOTIC ASSISTED INGUINAL HERNIA REPAIR WITH MESH;  Surgeon: Lane Shope, MD;  Location: [facility] ORS;  Service: General;  Laterality: Right;    Allergies: Cefdinir , Doxycycline , Prednisone, and Sertraline   Medications: Prior to Admission medications  Medication Sig Start Date End Date Taking? Authorizing Provider  amLODipine  (NORVASC ) 5 MG tablet TAKE 1 TABLET (5 MG TOTAL) BY MOUTH DAILY. 09/19/22   Simmons-Robinson, Rockie, MD  benazepril  (LOTENSIN )  40 MG tablet Take 1 tablet (40 mg total) by mouth daily. 12/22/22   Simmons-Robinson, Makiera, MD  carvedilol  (COREG ) 6.25 MG tablet TAKE 1 TABLET (6.25 MG TOTAL) BY MOUTH 2 (TWO) TIMES DAILY. 09/19/22   Simmons-Robinson, Rockie, MD  Cyanocobalamin  (VITAMIN B 12 PO) Take 1 capsule by mouth 2 (two) times daily. 1000 mcg each capsule    [provider]  doxazosin  (CARDURA ) 8 MG tablet Take 8 mg by mouth at bedtime.    [provider]  finasteride  (PROSCAR ) 5 MG tablet TAKE 1 TABLET BY MOUTH EVERY DAY 04/19/24   McGowan, Clotilda A, PA-C  gabapentin  (NEURONTIN ) 300 MG capsule TAKE 1 CAPSULE EVERY MORNING, 1 CAPSULE IN THE AFTERNOON AND 2 CAPSULES AT BEDTIME 04/08/23   Simmons-Robinson, Makiera, MD  ketoconazole  (NIZORAL ) 2 % cream Apply to dry skin on his forehead and nose qd-bid 06/27/24   Jackquline Sawyer, MD  Magnesium 500 MG TABS Take 500 mg by mouth daily.    [provider]  mometasone  (ELOCON ) 0.1 % cream Apply 1 Application topically as directed. Mometasone  cream bid up to 5 days per week (M-F only) aa buttocks 03/29/24   Hester Alm BROCKS, MD  Multiple Vitamin (MULTI-VITAMIN) tablet Take 1 tablet by mouth daily.     [provider]  oxyCODONE  (OXY IR/ROXICODONE ) 5 MG immediate release tablet Take 1 tablet (5 mg total) by mouth every 6 (six) hours as needed for severe pain. 12/03/22   Simmons-Robinson, Rockie, MD  tacrolimus  (PROTOPIC ) 0.1 % ointment Apply to irritated area on the buttock qd-bid 03/28/24   Hester Alm BROCKS, MD  triamterene -hydrochlorothiazide (MAXZIDE-25) 37.5-25 MG tablet TAKE 1/2 TABLET EVERY DAY 03/05/23   Simmons-Robinson, Rockie, MD  warfarin (COUMADIN ) 5 MG tablet TAKE 1 TABLET EVERY DAY 03/30/23   Simmons-Robinson, Rockie, MD     Family History  Problem Relation Age of Onset   Cancer Mother        secondary to cancinoma of the jaw from her dipping snuff.   Heart attack Father    CAD Father    Hypertension Sister    Diabetes Son        Type  I diabetes   Prostate cancer Neg Hx    Kidney cancer Neg Hx     Social History   Socioeconomic History   Marital status: Married    Spouse name: Doris   Number of children: 3   Years of education: Not on file   Highest education level: Associate degree: occupational, scientist, product/process development, or vocational program  Occupational History   Occupation: retired  Tobacco Use   Smoking status: Former    Current packs/day: 0.00    Types: Cigarettes    Quit date: 08/26/1971    Years since quitting: 52.9    Passive exposure: Never   Smokeless tobacco: Never   Tobacco comments:    Smoked for about 20 years.  Vaping Use   Vaping status: Never Used  Substance and Sexual Activity   Alcohol use: Not Currently   Drug use: No   Sexual activity: Not Currently  Other Topics Concern   Not on file  Social History Narrative   Not on file   Social Drivers of Health   Tobacco Use: Medium Risk (08/01/2024)   Received from [facility]   Patient History    Smoking Tobacco Use: Former    Smokeless Tobacco Use: Never    Passive Exposure: Not on file  Financial Resource Strain: Low Risk  (08/01/2024)   Received from [facility]   Overall Financial Resource Strain (CARDIA)    Difficulty of Paying Living Expenses: Not hard at all  Food Insecurity: No Food Insecurity (08/01/2024)   Received from [facility]   Epic    Within the past 12 months, you worried that your food would run out before you got the money to buy more.: Never true    Within the past 12 months, the food you bought just didn't last and you didn't have money to get more.: Never true  Transportation Needs: No Transportation Needs (08/01/2024)   Received from [facility] - Transportation    In the past 12 months, has lack of transportation kept you from medical appointments or from getting medications?: No    Lack of Transportation (Non-Medical): No  Physical  Activity: Sufficiently Active (12/23/2022)   Exercise Vital Sign    Days of Exercise per Week: 7 days    Minutes  of Exercise per Session: 50 min  Stress: No Stress Concern Present (12/18/2021)   [employer] - Occupational Stress Questionnaire    Feeling of Stress : Only a little  Social Connections: Moderately Integrated (12/23/2022)   Social Connection and Isolation Panel    Frequency of Communication with Friends and Family: Twice a week    Frequency of Social Gatherings with Friends and Family: Once a week    Attends Religious Services: More than 4 times per year    Active Member of Clubs or Organizations: No    Attends Banker Meetings: Never    Marital Status: Married  Depression (PHQ2-9): Low Risk (01/06/2023)   Depression (PHQ2-9)    PHQ-2 Score: 0  Alcohol Screen: Low Risk (02/23/2023)   Alcohol Screen    Last Alcohol Screening Score (AUDIT): 0  Housing: Low Risk  (08/01/2024)   Received from [facility]   Epic    In the last 12 months, was there a time when you were not able to pay the mortgage or rent on time?: No    In the past 12 months, how many times have you moved where you were living?: 0    At any time in the past 12 months, were you homeless or living in a shelter (including now)?: No  Utilities: Not At Risk (02/02/2024)   Received from [facility]   Epic    In the past 12 months has the electric, gas, oil, or water company threatened to shut off services in your home?: No  Health Literacy: Not on file    ECOG Status: 0 - Asymptomatic  Review of Systems: A 12 point ROS discussed and pertinent positives are indicated in the HPI above.  All other systems are negative.  Review of Systems  Vital Signs: BP (!) 170/85   Pulse 63   Temp 98.2 F (36.8 C)   Resp 16   Wt 68 kg   SpO2 98%   BMI 22.81 kg/m   Advance Care Plan: The advanced care plan/surrogate decision maker was discussed at  the time of visit and the patient did not wish to discuss or was not able to name a surrogate decision maker or provide an advance care plan.    Physical Exam Constitutional:      General: He is not in acute distress.    Appearance: Normal appearance. He is normal weight.  Eyes:     General: No scleral icterus. Cardiovascular:     Rate and Rhythm: Normal rate.  Pulmonary:     Effort: Pulmonary effort is normal.  Abdominal:     General: There is no distension.     Tenderness: There is no abdominal tenderness. There is no guarding.  Skin:    General: Skin is warm and dry.  Neurological:     Mental Status: He is alert and oriented to person, place, and time.  Psychiatric:        Mood and Affect: Mood normal.        Behavior: Behavior normal.       Imaging: IR Radiologist Eval & Mgmt Result Date: 08/04/2024 EXAM: ESTABLISHED PATIENT OFFICE VISIT CHIEF COMPLAINT: SEE NOTE IN EPIC HISTORY OF PRESENT ILLNESS: SEE NOTE IN EPIC REVIEW OF SYSTEMS: SEE NOTE IN EPIC PHYSICAL EXAMINATION: SEE NOTE IN EPIC ASSESSMENT AND PLAN: SEE NOTE IN EPIC Electronically Signed   By: Wilkie Lent M.D.   On: 08/04/2024 08:36   MR  TIBIA FIBULA RIGHT W WO CONTRAST Result Date: 07/15/2024 EXAM: MR RIGHT LOWER EXTREMITY WITH AND WITHOUT INTRAVENOUS CONTRAST 07/12/2024 04:13:35 PM TECHNIQUE: Multiplanar magnetic resonance images of the right lower extremity with and without intravenous contrast. CONTRAST: 6 mL of Gadavist . COMPARISON: Radiographs 06/29/2024. CLINICAL HISTORY: Right lateral leg mass for 2 years, area of interested indicated with markers. FINDINGS: SOFT TISSUES: Bilateral circumferential subcutaneous edema in both calves. Edema tracks deep to the medial head of the gastrocnemius adjacent to the plantaris tendon. Moderately atrophic tibialis anterior muscle. Distal achilles tendinopathy with mild fusiform expansion. Ovoid enhancing 1.8 x 1.0 x 1.9 cm subcutaneous lesion corresponding to the  palpable abnormality with accurately defined margins, accentuated CT signal, and intermediate T1 signal as shown on image 33 series 13. Small adjacent venous or lymphatic channels tangential to this lesion. Enhancement is homogeneous and diffuse. Just cephalad to this lesion, there is some adjacent mildly accentuated subcutaneous infiltrative enhancement as on image 27 series 13. Possibilities for the lesion include vascular malformation, peripheral nerve sheath tumor, or desmoid tumor. Entities such as soft tissue sarcoma or cutaneous lymphoma considered substantially less likely. If the mass is reducible on palpation, then it could be a venous varix. The superficial margin of the mass is 0.2 cm deep to the cutaneous surface. Tissue diagnosis may be indicated. JOINTS: Unremarkable. No dislocation or significant effusion. BONES: No acute fracture or focal osseous lesion. IMPRESSION: 1. Ovoid enhancing 1.8 x 1.0 x 1.9 cm subcutaneous lesion in the right lateral leg corresponding to the palpable abnormality, with homogeneous diffuse enhancement and small adjacent venous or lymphatic channels, with mildly accentuated subcutaneous infiltrative enhancement just cephalad; differential considerations include vascular malformation, peripheral nerve sheath tumor, lymph node, or desmoid tumor; entities such as soft tissue sarcoma or cutaneous lymphoma are substantially less likely, if the mass is reducible on palpation it could be a venous varix. Tissue diagnosis may be indicated. 2. Bilateral circumferential subcutaneous edema in both calves, tracking deep to the medial head of the gastrocnemius adjacent to the plantaris tendon. 3. Moderately atrophic tibialis anterior muscle. 4. Distal Achilles tendinopathy with mild fusiform expansion. Electronically signed by: Ryan Salvage MD 07/15/2024 09:34 AM EST RP Workstation: [id number]    Labs:  CBC: Recent Labs    08/07/23 0954 11/02/23 0744  WBC 4.4 3.8*  HGB  10.5* 11.6*  HCT 31.4* 35.2*  PLT 153 117*    COAGS: Recent Labs    11/02/23 0744  INR 1.3*    BMP: Recent Labs    08/07/23 0954 10/21/23 1022 11/02/23 0744  NA 140 140 141  K 4.3 4.1 3.6  CL 110 108 109  CO2 25 27 26   GLUCOSE 99 83 109*  BUN 25* 33* 23  CALCIUM 8.6* 8.6* 8.8*  CREATININE 0.99 1.16 1.06  GFRNONAA >60 >60 >60    LIVER FUNCTION TESTS: Recent Labs    08/07/23 0954  BILITOT 0.5  AST 17  ALT 14  ALKPHOS 55  PROT 5.9*  ALBUMIN 3.4*    TUMOR MARKERS: No results for input(s): AFPTM, CEA, CA199, CHROMGRNA in the last 8760 hours.  Assessment and Plan:  Very pleasant 89 year old gentleman now 9 months status post biopsy and percutaneous thermal ablation of right upper pole renal mass.  Biopsy came back positive for a low-grade oncocytic tumor (LOT).  Initial surveillance imaging shows a complete response to therapy.    1.)  Renal protocol CT scan of the abdomen with and without contrast followed by a clinic visit in 1  year.  Dr. Melanee is ordering CT scans as well for follow-up of his lung cancer, we do not need to duplicate CT imaging if he has CT scans of the chest, abdomen and pelvis already scheduled for a similar time frame.    Electronically Signed: Wilkie MARLA Lent 08/04/2024, 8:58 AM   I spent a total of  15 Minutes in face to face in clinical consultation, greater than 50% of which was counseling/coordinating care for right renal low-grade oncocytic tumor

## 2024-08-07 ENCOUNTER — Other Ambulatory Visit: Payer: Self-pay

## 2024-08-09 ENCOUNTER — Encounter: Payer: Self-pay | Admitting: Oncology

## 2024-08-09 ENCOUNTER — Encounter: Payer: Self-pay | Admitting: Surgery

## 2024-08-09 ENCOUNTER — Other Ambulatory Visit: Payer: Self-pay

## 2024-08-09 ENCOUNTER — Inpatient Hospital Stay
Admission: RE | Admit: 2024-08-09 | Discharge: 2024-08-09 | Disposition: A | Source: Ambulatory Visit | Attending: Surgery

## 2024-08-09 DIAGNOSIS — I38 Endocarditis, valve unspecified: Secondary | ICD-10-CM

## 2024-08-09 DIAGNOSIS — Z01812 Encounter for preprocedural laboratory examination: Secondary | ICD-10-CM

## 2024-08-09 DIAGNOSIS — I10 Essential (primary) hypertension: Secondary | ICD-10-CM

## 2024-08-09 DIAGNOSIS — I480 Paroxysmal atrial fibrillation: Secondary | ICD-10-CM

## 2024-08-09 NOTE — Patient Instructions (Addendum)
 Your procedure is scheduled on: 08/23/2024 Tuesday Report to the Registration Desk on the 1st floor of the [facility]. To find out your arrival time, please call [phone] between 1PM - 3PM on: Monday, 08/22/2024  If your arrival time is 6:00 am, do not arrive before that time as the [facility] entrance doors do not open until 6:00 am.  REMEMBER: Instructions that are not followed completely may result in serious medical risk, up to and including death; or upon the discretion of your surgeon and anesthesiologist your surgery may need to be rescheduled.  Do not eat food or drink after midnight the night before surgery.  No gum chewing or hard candies.    One week prior to surgery: Stop Anti-inflammatories (NSAIDS) groups such as Advil , Aleve, Ibuprofen , Motrin , Naproxen, Naprosyn and Aspirin based products such as Excedrin, Goody's Powder, BC Powder. Stop ANY OVER THE COUNTER supplements until after surgery like multivitamin, B-12 and magnesium   You may however, continue to take Tylenol  if needed for pain up until the day of surgery.  Follow instructions regarding coumadin .   Continue taking all of your other prescription medications up until the day of surgery.  ON THE DAY OF SURGERY ONLY TAKE THESE MEDICATIONS WITH SIPS OF WATER:  amLODipine  (NORVASC ) carvedilol  (COREG ) finasteride  (PROSCAR ) gabapentin  (NEURONTIN ) doxazosin  (CARDURA )     No Alcohol for 24 hours before or after surgery.  No Smoking including e-cigarettes for 24 hours before surgery.  No chewable tobacco products for at least 6 hours before surgery.  No nicotine patches on the day of surgery.  Do not use any recreational drugs for at least a week (preferably 2 weeks) before your surgery.  Please be advised that the combination of cocaine and anesthesia may have negative outcomes, up to and including death. If you test positive for cocaine, your surgery will be cancelled.  On the morning of  surgery brush your teeth with toothpaste and water, you may rinse your mouth with mouthwash if you wish. Do not swallow any toothpaste or mouthwash.  Use CHG Soap or wipes as directed on instruction sheet.  Do not wear jewelry, make-up, hairpins, clips or nail polish.  Do not wear lotions, powders, or perfumes.   Do not shave body hair from the neck down 48 hours before surgery.  Contact lenses, hearing aids and dentures may not be worn into surgery.  Do not bring valuables to the hospital. [facility] is not responsible for any missing/lost belongings or valuables.   Notify your doctor if there is any change in your medical condition (cold, fever, infection).  Wear comfortable clothing (specific to your surgery type) to the hospital.  After surgery, you can help prevent lung complications by doing breathing exercises.  Take deep breaths and cough every 1-2 hours. Your doctor may order a device called an Incentive Spirometer to help you take deep breaths.   If you are being discharged the day of surgery, you will not be allowed to drive home. You will need a responsible individual to drive you home and stay with you for 24 hours after surgery.    Please call the Pre-admissions Testing Dept. at [phone] if you have any questions about these instructions.  Surgery Visitation Policy:  Patients having surgery or a procedure may have two visitors.  Children under the age of 16 must have an adult with them who is not the patient.    Merchandiser, Retail to address health-related social needs:  [email or web address]  Preparing for Surgery with CHLORHEXIDINE  GLUCONATE (CHG) Soap  Chlorhexidine  Gluconate (CHG) Soap  o An antiseptic cleaner that kills germs and bonds with the skin to continue killing germs even after washing  o Used for showering  the night before surgery and morning of surgery  Before surgery, you can play an important role by reducing the number of germs on your skin.  CHG (Chlorhexidine  gluconate) soap is an antiseptic cleanser which kills germs and bonds with the skin to continue killing germs even after washing.  Please do not use if you have an allergy to CHG or antibacterial soaps. If your skin becomes reddened/irritated stop using the CHG.  1. Shower the NIGHT BEFORE SURGERY with CHG soap.  2. If you choose to wash your hair, wash your hair first as usual with your normal shampoo.  3. After shampooing, rinse your hair and body thoroughly to remove the shampoo.  4. Use CHG as you would any other liquid soap. You can apply CHG directly to the skin and wash gently with a clean washcloth.  5. Apply the CHG soap to your body only from the neck down. Do not use on open wounds or open sores. Avoid contact with your eyes, ears, mouth, and genitals (private parts). Wash face and genitals (private parts) with your normal soap.  6. Wash thoroughly, paying special attention to the area where your surgery will be performed.  7. Thoroughly rinse your body with warm water.  8. Do not shower/wash with your normal soap after using and rinsing off the CHG soap.  9. Do not use lotions, oils, etc., after showering with CHG.  10. Pat yourself dry with a clean towel.  11. Wear clean pajamas to bed the night before surgery.  12. Place clean sheets on your bed the night of your shower and do not sleep with pets.  13. Do not apply any deodorants/lotions/powders.  14. Please wear clean clothes to the hospital.  15. Remember to brush your teeth with your regular toothpaste.

## 2024-08-15 ENCOUNTER — Other Ambulatory Visit: Payer: Self-pay | Admitting: *Deleted

## 2024-08-15 ENCOUNTER — Encounter: Payer: Self-pay | Admitting: Surgery

## 2024-08-15 DIAGNOSIS — C3412 Malignant neoplasm of upper lobe, left bronchus or lung: Secondary | ICD-10-CM

## 2024-08-15 NOTE — Progress Notes (Signed)
 " Perioperative / Anesthesia Services  Pre-Admission Testing Clinical Review / Pre-Operative Anesthesia Consult  Date: 08/15/2024  PATIENT DEMOGRAPHICS: Name: Casey Gallegos DOB: 11/02/1934 MRN:   [id number]  Note: Available PAT nursing documentation and vital signs have been reviewed. Clinical nursing staff has updated patient's PMH/PSHx, current medication list, and drug allergies/intolerances to ensure complete and comprehensive history available to assist care teams in MDM as it pertains to the aforementioned surgical procedure and anticipated anesthetic course. Extensive review of available clinical information personally performed. Nursing documentation reviewed. [place] PMH and PSHx updated with any diagnoses and/or procedures that I have knowledge of that may have been inadvertently omitted during his intake with the pre-admission testing department's nursing staff.  PLANNED SURGICAL PROCEDURE(S):   Case: [id number] Date/Time: 08/23/24 0715   Procedure: EXCISION MASS LOWER EXTREMITIES (Right: Leg Lower)   Anesthesia type: General   Diagnosis: Lower leg mass, right [R22.41]   Pre-op diagnosis: lower leg mass, right R44.41   Location: [facility] OR ROOM 07 / [facility]S [facility] AN[facility]   Surgeons: Tye Millet, DO        CLINICAL DISCUSSION: Casey Gallegos is a 89 y.o. male who is submitted for pre-surgical anesthesia review and clearance prior to him undergoing the above procedure. Patient is a Former Smoker (quit 08/1971). Pertinent PMH includes: atrial fibrillation, NICM, VHD, RBBB, DVT, aortic atherosclerosis, HTN, HLD, DOE, emphysema, NSCLC (SCC) LEFT upper lobe, anemia, GERD (no daily Tx), RIGHT lower extremity mass, bladder calculi, BPH with BOO, OA, thoracic spondylosis, thoracolumbar DDD (s/p laminectomy and L4-S1 decompression), BPH.   Patient is followed by cardiology Juanice, MD). He was last seen in the cardiology clinic on 05/13/2024; notes reviewed. At the time of his  clinic visit, patient doing well overall from a cardiovascular perspective. Patient denied any chest pain, shortness of breath, PND, orthopnea, palpitations, significant peripheral edema, weakness, fatigue, vertiginous symptoms, or presyncope/syncope. Patient with a past medical history significant for cardiovascular diagnoses. Documented physical exam was grossly benign, providing no evidence of acute exacerbation and/or decompensation of the patient's known cardiovascular conditions.  Most recent TTE performed on 08/30/2020 revealed a mildly reduced left ventricular systolic function with an EF of 45%. There septal hypokinesis observed. Right ventricular size and function normal.  RVSP = 38.3 mmHg.  There was mild mitral, tricuspid, and pulmonary valve regurgitation. All transvalvular gradients were noted to be normal providing no evidence suggestive of valvular stenosis. Aorta normal in size with no evidence of ectasia or aneurysmal dilatation.  Patient with an atrial fibrillation diagnosis; CHA2DS2-VASc Score = 6 (age x 2, HTN, DVT x 2, vascular disease). His rate and rhythm are currently being maintained on oral carvedilol . He is chronically anticoagulated using warfarin; reported to be compliant with therapy with no evidence or reports of GI/GU bleeding.  Blood pressure reasonably controlled at 140/82 mmHg on currently prescribed CCB (amlodipine ), ACEi (benazepril ), beta-clocker (carvedilol ), alpha-blocker (doxazosin ), and diuretic (triamterene /hydrochlorothiazide) therapies. He is not on any type of lipid lowering therapies for his HLD diagnosis and further ASCVD prevention. He is not diabetic. Patient does not have an OSAH diagnosis. Functional capacity limited by age and his multiple medical comorbidities. He is able to complete his ADL/IADLs without significant cardiovascular limitation. Per the DASI, patient questionably able to complete 4 METS of physical activity without experiencing, at least to  some degree, significant angina/anginal equivalent symptoms.  No changes were made to his medication regimen during his visit with cardiology.  Patient scheduled to follow-up with outpatient cardiology  in 6 months or sooner if needed.   Casey Gallegos is scheduled for an elective EXCISION MASS LOWER EXTREMITIES (Right: Leg Lower) on 08/23/2024 with Dr. Henriette Pierre, DO. Given patient's past medical history significant for cardiovascular diagnoses, presurgical cardiac clearance was sought by the PAT team. Per cardiology, this patient is optimized for surgery and may proceed with the planned procedural course with a MODERATE risk of significant perioperative cardiovascular complications.  Again, this patient is on daily oral anticoagulation therapy using warfarin.  Patient has been instructed on recommendations from his cardiology team for holding his warfarin dose for 2 days prior to his procedure with plans to restart as soon as postoperative bleeding risk to be minimized by his primary attending surgeon.  Patient is aware that his last dose of warfarin should be on 08/20/2024.  Cardiology also indicated that patient will not require enoxaparin bridging surrounding this procedure.    Patient denies previous perioperative complications with anesthesia in the past. In review his EMR, it is noted that patient underwent a general anesthetic course here at [facility] ([facility]) in 07/2024 without documented complications.   MOST RECENT VITAL SIGNS:    08/04/2024    8:09 AM 07/27/2024    9:48 AM 07/27/2024    9:40 AM  Vitals with BMI  Weight 150 lbs    BMI 22.81    Systolic 170 105 867  Diastolic 85 77 73  Pulse 63 68 62   PROVIDERS/SPECIALISTS: NOTE: Primary physician provider listed below. Patient may have been seen by APP or partner within same practice.   PROVIDER ROLE / SPECIALTY LAST SHERLEAN Pierre Henriette, DO General Surgery (Surgeon) 08/01/2024  Bertrum Charlie CROME,  MD Primary Care Provider 07/14/2024  Dewane Shiner, DO Cardiology 05/13/2024  Melanee Piggs, MD Medical Oncology 02/29/2024  Lenn Aran, MD Radiation Oncology 02/15/2024  Avanell Katz, MD Physiatry 11/18/2021   ALLERGIES: Allergies[1]  CURRENT HOME MEDICATIONS:  amLODipine  (NORVASC ) 5 MG tablet   benazepril  (LOTENSIN ) 40 MG tablet   carvedilol  (COREG ) 6.25 MG tablet   Cyanocobalamin  (VITAMIN B 12 PO)   doxazosin  (CARDURA ) 8 MG tablet   finasteride  (PROSCAR ) 5 MG tablet   gabapentin  (NEURONTIN ) 300 MG capsule   Magnesium 500 MG TABS   Multiple Vitamin (MULTI-VITAMIN) tablet   oxyCODONE  (OXY IR/ROXICODONE ) 5 MG immediate release tablet   triamterene -hydrochlorothiazide (MAXZIDE-25) 37.5-25 MG tablet   warfarin (COUMADIN ) 4 MG tablet   warfarin (COUMADIN ) 5 MG tablet   ketoconazole  (NIZORAL ) 2 % cream   mometasone  (ELOCON ) 0.1 % cream   tacrolimus  (PROTOPIC ) 0.1 % ointment    heparin  lock flush 100 UNIT/ML injection   HISTORY: Past Medical History:  Diagnosis Date   A-fib (HCC)    a.) CHA2DS2-VASc = 6 (age x2, HTN, DVT x2, vascular disease history). b.) rate/rhythm maintained on oral carvedilol ; chronically anticoagulated using daily warfarin.   Actinic keratosis    Adenomatous polyp    Allergic rhinitis    Anemia    Anticoagulated on warfarin    Aortic atherosclerosis    Benign essential hypertension    Bilateral renal cysts    Bladder calculi    BPH with urinary obstruction    CAD (coronary artery disease)    Chronic venous insufficiency    DDD (degenerative disc disease), thoracolumbar    a.) s/p LEFT laminectomy with L4-S1 decompression   Diverticulosis    DOE (dyspnea on exertion)    DVT (deep venous thrombosis) (HCC)  Elevated PSA    GERD (gastroesophageal reflux disease)    HLD (hyperlipidemia)    Inguinal hernia    Lipoma of arm    Lipomatosis    NICM (nonischemic cardiomyopathy) (HCC) 11/22/2015   a.) TTE 11/22/2015: EF 40%. b.) TTE  12/27/2018: EF 45%. c.) TTE 08/30/2020: EF 45-50%.   Night sweats    Obesity    Osteoarthritis    Pulmonary emphysema (HCC)    RBBB (right bundle branch block)    Right inguinal hernia    Right renal mass 04/24/2022   a.) CT A/P 04/24/22: 2.7 x 2.8 cm enhancing septation lateral aspect RIGHT kidney concerning for cystic renal neoplasm; b.) CT chest 05/15/22: 3 cm RIGHT renal mass; c.) CT A/P 08/17/22: 2.9 x 2.6 cm exophytic mass; d.) CT A/P 11/19/22: 3.0 x 2.6 cm solid & cystic mass peripheral superior RIGHT renal pole c/w RCC (Bosniak IV); e.) CT A/P 03/30/23: 3.1 x 2.6 cm; f.) CT A/P 07/21/23: 3.3 x 2.9 cm   Squamous cell lung cancer (HCC) 04/24/2022   a.)  CT chest 04/24/2022: Multilobulated perihilar mass measuring 5.3 x 3.7 x 2.8 cm with associated LEFT hilar and mediastinal LAD; b.)  PET CT 05/07/2022: Hypermetabolic LEFT upper lobe lung mass (max SUV 15.1); ipsilateral nodal metastasis --> presuming a NSC histology, consistent with stage IIIb pulmonary neoplasm (T3 N2 M0) --> path (+) NSCLC (SCC)   Thoracic spondylosis    VHD (valvular heart disease) 11/22/2015   a.) TTE 11/22/2015: EF 40%; mod MR, mod-sev TR, triv PR; mod BAE, mild RV enlargement. b.) TTE 12/27/2018: EF 45%; mod MR, mod-sev TR, triv PR; mod BAE, mild RV enlargement. c.) TTE 08/30/2020: EF 45-50%; mild MR/TR.   Past Surgical History:  Procedure Laterality Date   Bilateral Radical Keratotomy Bilateral 1997   COLONOSCOPY WITH PROPOFOL  N/A 01/11/2016   Procedure: COLONOSCOPY WITH PROPOFOL ;  Surgeon: Lamar ONEIDA Holmes, MD;  Location: [facility] ENDOSCOPY;  Service: Endoscopy;  Laterality: N/A; repeat 3 years, tubular adenoma   DENTAL SURGERY     Patient had implants   ESOPHAGOGASTRODUODENOSCOPY N/A 07/27/2024   Procedure: EGD (ESOPHAGOGASTRODUODENOSCOPY);  Surgeon: Therisa Bi, MD;  Location: [facility] ENDOSCOPY;  Service: Gastroenterology;  Laterality: N/A;   FLEXIBLE BRONCHOSCOPY Left 05/19/2022   Procedure: FLEXIBLE BRONCHOSCOPY;   Surgeon: Tamea Dedra CROME, MD;  Location: [facility] ORS;  Service: Cardiopulmonary;  Laterality: Left;   GUM SURGERY     IR IMAGING GUIDED PORT INSERTION  06/13/2022   IR RADIOLOGIST EVAL & MGMT  10/06/2023   IR RADIOLOGIST EVAL & MGMT  11/19/2023   IR RADIOLOGIST EVAL & MGMT  08/04/2024   L4 - S1 decompression Left    PROSTATE BIOPSY     TONSILLECTOMY AND ADENOIDECTOMY  age 64   VIDEO BRONCHOSCOPY WITH ENDOBRONCHIAL ULTRASOUND Left 05/19/2022   Procedure: VIDEO BRONCHOSCOPY WITH ENDOBRONCHIAL ULTRASOUND;  Surgeon: Tamea Dedra CROME, MD;  Location: [facility] ORS;  Service: Cardiopulmonary;  Laterality: Left;   XI ROBOTIC ASSISTED INGUINAL HERNIA REPAIR WITH MESH Right 10/21/2021   Procedure: XI ROBOTIC ASSISTED INGUINAL HERNIA REPAIR WITH MESH;  Surgeon: Lane Shope, MD;  Location: [facility] ORS;  Service: General;  Laterality: Right;   Family History  Problem Relation Age of Onset   Cancer Mother        secondary to cancinoma of the jaw from her dipping snuff.   Heart attack Father    CAD Father    Hypertension Sister    Diabetes Son        Type  I diabetes   Prostate cancer Neg Hx    Kidney cancer Neg Hx    Social History   Tobacco Use   Smoking status: Former    Current packs/day: 0.00    Types: Cigarettes    Quit date: 08/26/1971    Years since quitting: 53.0    Passive exposure: Never   Smokeless tobacco: Never   Tobacco comments:    Smoked for about 20 years.  Substance Use Topics   Alcohol use: Not Currently   LABS:  Infusion on 08/16/2024  Component Date Value Ref Range Status   Sodium 08/16/2024 142  135 - 145 mmol/L Final   Potassium 08/16/2024 4.0  3.5 - 5.1 mmol/L Final   Chloride 08/16/2024 109  98 - 111 mmol/L Final   CO2 08/16/2024 24  22 - 32 mmol/L Final   Glucose, Bld 08/16/2024 101 (H)  70 - 99 mg/dL Final   Glucose reference range applies only to samples taken after fasting for at least 8 hours.   BUN 08/16/2024 19  8 - 23 mg/dL Final   Creatinine 87/76/7974  0.97  0.61 - 1.24 mg/dL Final   Calcium 87/76/7974 8.7 (L)  8.9 - 10.3 mg/dL Final   Total Protein 87/76/7974 6.1 (L)  6.5 - 8.1 g/dL Final   Albumin 87/76/7974 4.0  3.5 - 5.0 g/dL Final   AST 87/76/7974 21  15 - 41 U/L Final   ALT 08/16/2024 14  0 - 44 U/L Final   Alkaline Phosphatase 08/16/2024 64  38 - 126 U/L Final   Total Bilirubin 08/16/2024 0.8  0.0 - 1.2 mg/dL Final   GFR, Estimated 08/16/2024 >60  >60 mL/min Final   Comment: (NOTE) Calculated using the CKD-EPI Creatinine Equation (2021)    Anion gap 08/16/2024 9  5 - 15 Final   Performed at [facility], [address] [place], [place] [address]   WBC 08/16/2024 4.0  4.0 - 10.5 K/uL Final   RBC 08/16/2024 3.48 (L)  4.22 - 5.81 MIL/uL Final   Hemoglobin 08/16/2024 11.6 (L)  13.0 - 17.0 g/dL Final   HCT 87/76/7974 34.7 (L)  39.0 - 52.0 % Final   MCV 08/16/2024 99.7  80.0 - 100.0 fL Final   MCH 08/16/2024 33.3  26.0 - 34.0 pg Final   MCHC 08/16/2024 33.4  30.0 - 36.0 g/dL Final   RDW 87/76/7974 14.6  11.5 - 15.5 % Final   Platelets 08/16/2024 116 (L)  150 - 400 K/uL Final   nRBC 08/16/2024 0.0  0.0 - 0.2 % Final   Neutrophils Relative % 08/16/2024 68  % Final   Neutro Abs 08/16/2024 2.7  1.7 - 7.7 K/uL Final   Lymphocytes Relative 08/16/2024 17  % Final   Lymphs Abs 08/16/2024 0.7  0.7 - 4.0 K/uL Final   Monocytes Relative 08/16/2024 11  % Final   Monocytes Absolute 08/16/2024 0.5  0.1 - 1.0 K/uL Final   Eosinophils Relative 08/16/2024 3  % Final   Eosinophils Absolute 08/16/2024 0.1  0.0 - 0.5 K/uL Final   Basophils Relative 08/16/2024 1  % Final   Basophils Absolute 08/16/2024 0.0  0.0 - 0.1 K/uL Final   Immature Granulocytes 08/16/2024 0  % Final   Abs Immature Granulocytes 08/16/2024 0.01  0.00 - 0.07 K/uL Final   Performed at [facility], [address] [place], [place] [address]  Hospital Outpatient Visit on 08/16/2024  Component Date Value Ref Range Status   WBC 08/16/2024 3.8 (  L)  4.0 - 10.5 K/uL  Final   RBC 08/16/2024 3.52 (L)  4.22 - 5.81 MIL/uL Final   Hemoglobin 08/16/2024 11.7 (L)  13.0 - 17.0 g/dL Final   HCT 87/76/7974 35.7 (L)  39.0 - 52.0 % Final   MCV 08/16/2024 101.4 (H)  80.0 - 100.0 fL Final   MCH 08/16/2024 33.2  26.0 - 34.0 pg Final   MCHC 08/16/2024 32.8  30.0 - 36.0 g/dL Final   RDW 87/76/7974 14.8  11.5 - 15.5 % Final   Platelets 08/16/2024 122 (L)  150 - 400 K/uL Final   nRBC 08/16/2024 0.0  0.0 - 0.2 % Final   Performed at [facility], [address] [place], [place] [address]  Admission on 07/27/2024, Discharged on 07/27/2024   ECG: Date: 05/13/2024  Time ECG obtained: 0928 AM Rate: 49 bpm Rhythm: atrial fibrillation; RBBB Axis (leads I and aVF): left Intervals: QRS 138 ms. QTc 444 ms. ST segment and T wave changes: No evidence of acute T wave abnormalities or significant ST segment elevation or depression.  Evidence of a possible, age undetermined, prior infarct:  Yes; lateral Comparison: Similar to previous tracing obtained on 11/12/2023   IMAGING / PROCEDURES: CT CHEST ABDOMEN PELVIS W CONTRAST performed on 08/01/2024 Stable medial left upper lobe/perihilar opacity, likely post-radiation change. No findings suspicious for metastatic disease Improving right upper pole hyperdense renal lesion, status post percutaneous ablation Stable 2.0 cm left upper pole hyperdense renal lesion, chronic.  MR BRAIN W WO CONTRAST performed on 05/26/2022 No abnormal enhancement to suggest metastatic disease.  Remote insult in the subcortical high left frontal lobe with chronic blood products, nonenhancing. Additional small focus of remote hemorrhage in the superior right cerebellum, also nonenhancing.  Generalized cerebral volume loss.  No acute or subacute infarct, widespread hemorrhagic injury, hydrocephalus, or collection.   NM PET IMAGE INITIAL (PI) SKULL BASE TO THIGH performed on 05/07/2022 Left upper lobe primary bronchogenic carcinoma with  ipsilateral nodal metastasis. Presuming non-small-cell histology, T3N2M0 or stage IIIB. Aortic atherosclerosis Coronary artery atherosclerosis  Emphysema Prostatomegaly Bladder calculi.   LONG TERM CARDIAC EVENT MONITOR STUDY performed on 06/04/2021 Predominant underlying atrial fibrillation with occasional PVCs.   Evidence of sick sinus syndrome and asymptomatic bradycardia with long RR intervals of >3 seconds noted.   Discussed potential for PPM placement for sick sinus syndrome if/when patient becomes more symptomatic.   TRANSTHORACIC ECHOCARDIOGRAM performed on 08/30/2020 LVEF 45-50% Mild left ventricular systolic dysfunction Normal right ventricular systolic function Mild MR and TR No AR or PR No valvular stenosis No pericardial effusion  IMPRESSION AND PLAN: Casey Gallegos has been referred for pre-anesthesia review and clearance prior to him undergoing the planned anesthetic and procedural courses. Available labs, pertinent testing, and imaging results were personally reviewed by me in preparation for upcoming operative/procedural course. [facility] medical record has been updated following extensive record review and patient interview with PAT staff.   This patient has been appropriately cleared by cardiology with an overall MODERATE risk of patient experiencing significant perioperative cardiovascular complications. here at [facility] Based on clinical review performed today (08/15/2024), barring any significant acute changes in the patient's overall condition, it is anticipated that he will be able to proceed with the planned surgical intervention. Any acute changes in clinical condition may necessitate his procedure being postponed and/or cancelled. Patient will meet with anesthesia team (MD and/or CRNA) on the day of his procedure for preoperative evaluation/assessment. Questions regarding anesthetic course  will be fielded at that time.    Pre-surgical instructions were reviewed with the patient during his PAT appointment, and questions were fielded to satisfaction by PAT clinical staff. He has been instructed on which medications that he will need to hold prior to surgery, as well as the ones that have been deemed safe/appropriate to take on the day of his procedure. As part of the general education provided by PAT, patient made aware both verbally and in writing, that he would need to abstain from the use of any illegal substances during his perioperative course. He was advised that failure to follow the provided instructions could necessitate case cancellation or result in serious perioperative complications up to and including death. Patient encouraged to contact PAT and/or his surgeon's office to discuss any questions or concerns that may arise prior to surgery; verbalized understanding.   Dorise Pereyra, MSN, APRN, FNP-C, CEN [facility]  Perioperative Services Nurse Practitioner Phone: [phone] Fax: [phone] 08/15/2024 9:59 AM  NOTE: This note has been prepared using Dragon dictation software. Despite my best ability to proofread, there is always the potential that unintentional transcriptional errors may still occur from this process.     [1]  Allergies Allergen Reactions   Cefdinir  Nausea Only    hypersensitivity to smell   Doxycycline      Sun sensitivity   Prednisone     Hallucinations    Sertraline  Other (See Comments)    Hallucinations    "

## 2024-08-16 ENCOUNTER — Encounter: Payer: Self-pay | Admitting: Urgent Care

## 2024-08-16 ENCOUNTER — Encounter
Admission: RE | Admit: 2024-08-16 | Discharge: 2024-08-16 | Disposition: A | Discharge status: Home / Self Care | Source: Ambulatory Visit | Attending: Surgery | Admitting: Surgery

## 2024-08-16 ENCOUNTER — Encounter: Payer: Self-pay | Admitting: Oncology

## 2024-08-16 ENCOUNTER — Inpatient Hospital Stay: Attending: Oncology

## 2024-08-16 ENCOUNTER — Inpatient Hospital Stay: Admitting: Oncology

## 2024-08-16 VITALS — BP 182/85 | HR 52 | Temp 98.6°F | Resp 20 | Wt 156.5 lb

## 2024-08-16 DIAGNOSIS — Z923 Personal history of irradiation: Secondary | ICD-10-CM | POA: Diagnosis not present

## 2024-08-16 DIAGNOSIS — I480 Paroxysmal atrial fibrillation: Secondary | ICD-10-CM | POA: Diagnosis not present

## 2024-08-16 DIAGNOSIS — C3412 Malignant neoplasm of upper lobe, left bronchus or lung: Secondary | ICD-10-CM | POA: Diagnosis not present

## 2024-08-16 DIAGNOSIS — Z85528 Personal history of other malignant neoplasm of kidney: Secondary | ICD-10-CM | POA: Diagnosis not present

## 2024-08-16 DIAGNOSIS — Z85118 Personal history of other malignant neoplasm of bronchus and lung: Secondary | ICD-10-CM

## 2024-08-16 DIAGNOSIS — I38 Endocarditis, valve unspecified: Secondary | ICD-10-CM | POA: Diagnosis not present

## 2024-08-16 DIAGNOSIS — Z9221 Personal history of antineoplastic chemotherapy: Secondary | ICD-10-CM | POA: Insufficient documentation

## 2024-08-16 DIAGNOSIS — I4891 Unspecified atrial fibrillation: Secondary | ICD-10-CM

## 2024-08-16 DIAGNOSIS — Z01812 Encounter for preprocedural laboratory examination: Secondary | ICD-10-CM | POA: Diagnosis not present

## 2024-08-16 DIAGNOSIS — Z7901 Long term (current) use of anticoagulants: Secondary | ICD-10-CM | POA: Insufficient documentation

## 2024-08-16 DIAGNOSIS — Z08 Encounter for follow-up examination after completed treatment for malignant neoplasm: Secondary | ICD-10-CM | POA: Diagnosis not present

## 2024-08-16 DIAGNOSIS — Z01818 Encounter for other preprocedural examination: Secondary | ICD-10-CM | POA: Diagnosis present

## 2024-08-16 LAB — CMP (CANCER CENTER ONLY)
ALT: 14 U/L (ref 0–44)
AST: 21 U/L (ref 15–41)
Albumin: 4 g/dL (ref 3.5–5.0)
Alkaline Phosphatase: 64 U/L (ref 38–126)
Anion gap: 9 (ref 5–15)
BUN: 19 mg/dL (ref 8–23)
CO2: 24 mmol/L (ref 22–32)
Calcium: 8.7 mg/dL — ABNORMAL LOW (ref 8.9–10.3)
Chloride: 109 mmol/L (ref 98–111)
Creatinine: 0.97 mg/dL (ref 0.61–1.24)
GFR, Estimated: >60 mL/min
Glucose, Bld: 101 mg/dL — ABNORMAL HIGH (ref 70–99)
Potassium: 4 mmol/L (ref 3.5–5.1)
Sodium: 142 mmol/L (ref 135–145)
Total Bilirubin: 0.8 mg/dL (ref 0.0–1.2)
Total Protein: 6.1 g/dL — ABNORMAL LOW (ref 6.5–8.1)

## 2024-08-16 LAB — CBC WITH DIFFERENTIAL/PLATELET
Abs Immature Granulocytes: 0.01 K/uL (ref 0.00–0.07)
Basophils Absolute: 0 K/uL (ref 0.0–0.1)
Basophils Relative: 1 %
Eosinophils Absolute: 0.1 K/uL (ref 0.0–0.5)
Eosinophils Relative: 3 %
HCT: 34.7 % — ABNORMAL LOW (ref 39.0–52.0)
Hemoglobin: 11.6 g/dL — ABNORMAL LOW (ref 13.0–17.0)
Immature Granulocytes: 0 %
Lymphocytes Relative: 17 %
Lymphs Abs: 0.7 K/uL (ref 0.7–4.0)
MCH: 33.3 pg (ref 26.0–34.0)
MCHC: 33.4 g/dL (ref 30.0–36.0)
MCV: 99.7 fL (ref 80.0–100.0)
Monocytes Absolute: 0.5 K/uL (ref 0.1–1.0)
Monocytes Relative: 11 %
Neutro Abs: 2.7 K/uL (ref 1.7–7.7)
Neutrophils Relative %: 68 %
Platelets: 116 K/uL — ABNORMAL LOW (ref 150–400)
RBC: 3.48 MIL/uL — ABNORMAL LOW (ref 4.22–5.81)
RDW: 14.6 % (ref 11.5–15.5)
WBC: 4 K/uL (ref 4.0–10.5)
nRBC: 0 % (ref 0.0–0.2)

## 2024-08-16 LAB — BASIC METABOLIC PANEL WITH GFR
Anion gap: 8 (ref 5–15)
BUN: 21 mg/dL (ref 8–23)
CO2: 27 mmol/L (ref 22–32)
Calcium: 8.9 mg/dL (ref 8.9–10.3)
Chloride: 106 mmol/L (ref 98–111)
Creatinine, Ser: 1.06 mg/dL (ref 0.61–1.24)
GFR, Estimated: >60 mL/min
Glucose, Bld: 95 mg/dL (ref 70–99)
Potassium: 3.9 mmol/L (ref 3.5–5.1)
Sodium: 141 mmol/L (ref 135–145)

## 2024-08-16 LAB — CBC
HCT: 35.7 % — ABNORMAL LOW (ref 39.0–52.0)
Hemoglobin: 11.7 g/dL — ABNORMAL LOW (ref 13.0–17.0)
MCH: 33.2 pg (ref 26.0–34.0)
MCHC: 32.8 g/dL (ref 30.0–36.0)
MCV: 101.4 fL — ABNORMAL HIGH (ref 80.0–100.0)
Platelets: 122 K/uL — ABNORMAL LOW (ref 150–400)
RBC: 3.52 MIL/uL — ABNORMAL LOW (ref 4.22–5.81)
RDW: 14.8 % (ref 11.5–15.5)
WBC: 3.8 K/uL — ABNORMAL LOW (ref 4.0–10.5)
nRBC: 0 % (ref 0.0–0.2)

## 2024-08-16 NOTE — Progress Notes (Signed)
 "    Hematology/Oncology Consult note [facility]  [phone] [phone]  Patient Care Team: Bertrum Charlie CROME, MD as PCP - General (Family Medicine) Penne Knee, MD (Inactive) as Consulting Physician (Urology) Mittie Gaskin, MD as Referring Physician (Ophthalmology) Verdene Gills, RN as Oncology Nurse Navigator Melanee Annah BROCKS, MD as Consulting Physician (Oncology) Tamea Dedra CROME, MD as Consulting Physician (Pulmonary Disease)   Name of the [name]  [id number]  [birth year]   Date of visit: 08/16/2024  Diagnosis- history of  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  He was found to have a left upper lobe lung mass measuring 5.5 x 3.7 x 2.8 cm.  It was confluent with the left hilar lymph node.  Enlarged AP window lymph node measuring 16 mm.  Enlarged left paratracheal lymph node.  Low-attenuation 7 mm lesion in the liver.  7 mm cyst in the uncinate process of the pancreas for which follow-up was not recommended.  Bilateral kidney cysts.  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  Prostatic enlargement.   PET CT is can showed hypermetabolic left upper lobe lung mass measuring 5.4 x 3.9 cm with an SUV of 15.1.  Ipsilateral mediastinal lymph node metastases with index lymph node measuring 1.7 with an SUV of 9.8.  No evidence of distant metastatic disease.  Biopsy of the left upper lobe as well as station 4R lymph node was consistent with squamous cell carcinoma   NGS testing did not show any actionable mutations.  PD-L1 35%.  MSI stable.  MRI brain was negative for metastatic disease   Patient completed concurrent  chemoradiation with weekly CarboTaxol chemotherapy in December 2023.  Scans following that showed partial response to treatment.  Patient started adjuvant durvalumab  in January 2024.  Patient remains in remission since then      Interval history- History of Present Illness   Casey Gallegos is an 89 year old male with stage III left lower lobe lung cancer in remission and left renal cell carcinoma post-ablation who presents for routine oncology follow-up and review of recent CT imaging.  He denies respiratory symptoms except for intermittent coughing episodes triggered by swallowing cold liquids.  He experiences intermittent dysphagia, particularly with cold liquids, which provokes coughing. He manages this by consuming beverages at room temperature.       ECOG PS- 1 Pain scale- 0   Review of systems- Review of Systems  Constitutional:  Negative for chills, fever, malaise/fatigue and weight loss.  HENT:  Negative for congestion, ear discharge and nosebleeds.   Eyes:  Negative for blurred vision.  Respiratory:  Positive for cough. Negative for hemoptysis, sputum production, shortness of breath and wheezing.   Cardiovascular:  Negative for chest pain, palpitations, orthopnea and claudication.  Gastrointestinal:  Negative for abdominal pain, blood in stool, constipation, diarrhea, heartburn, melena, nausea and vomiting.  Genitourinary:  Negative for dysuria, flank pain, frequency, hematuria and urgency.  Musculoskeletal:  Negative for back pain, joint pain and myalgias.  Skin:  Negative for rash.  Neurological:  Negative for dizziness, tingling, focal weakness, seizures, weakness and headaches.  Endo/Heme/Allergies:  Does not bruise/bleed easily.  Psychiatric/Behavioral:  Negative for depression and suicidal ideas. The patient  does not have insomnia.       Allergies[1]   Past Medical History:  Diagnosis Date   A-fib [facility])    a.) CHA2DS2-VASc = 6 (age x2, HTN, DVT x2, vascular disease  history). b.) rate/rhythm maintained on oral carvedilol ; chronically anticoagulated using daily warfarin.   Actinic keratosis    Adenomatous polyp    Allergic rhinitis    Anemia    Anticoagulated on warfarin    Aortic atherosclerosis    Benign essential hypertension    Bilateral renal cysts    Bladder calculi    BPH with urinary obstruction    CAD (coronary artery disease)    Chronic venous insufficiency    DDD (degenerative disc disease), thoracolumbar    a.) s/p LEFT laminectomy with L4-S1 decompression   Diverticulosis    DOE (dyspnea on exertion)    DVT (deep venous thrombosis) (HCC)    Elevated PSA    GERD (gastroesophageal reflux disease)    HLD (hyperlipidemia)    Inguinal hernia    Lipoma of arm    Lipomatosis    NICM (nonischemic cardiomyopathy) (HCC) 11/22/2015   a.) TTE 11/22/2015: EF 40%. b.) TTE 12/27/2018: EF 45%. c.) TTE 08/30/2020: EF 45-50%.   Night sweats    Obesity    Osteoarthritis    Pulmonary emphysema (HCC)    RBBB (right bundle branch block)    Right inguinal hernia    Right renal mass 04/24/2022   a.) CT A/P 04/24/22: 2.7 x 2.8 cm enhancing septation lateral aspect RIGHT kidney concerning for cystic renal neoplasm; b.) CT chest 05/15/22: 3 cm RIGHT renal mass; c.) CT A/P 08/17/22: 2.9 x 2.6 cm exophytic mass; d.) CT A/P 11/19/22: 3.0 x 2.6 cm solid & cystic mass peripheral superior RIGHT renal pole c/w RCC (Bosniak IV); e.) CT A/P 03/30/23: 3.1 x 2.6 cm; f.) CT A/P 07/21/23: 3.3 x 2.9 cm   Squamous cell lung cancer (HCC) 04/24/2022   a.)  CT chest 04/24/2022: Multilobulated perihilar mass measuring 5.3 x 3.7 x 2.8 cm with associated LEFT hilar and mediastinal LAD; b.)  PET CT 05/07/2022: Hypermetabolic LEFT upper lobe lung mass (max SUV 15.1); ipsilateral nodal metastasis --> presuming a NSC histology, consistent with stage IIIb pulmonary neoplasm (T3 N2 M0) --> path (+) NSCLC (SCC)   Thoracic spondylosis    VHD (valvular heart disease) 11/22/2015   a.)  TTE 11/22/2015: EF 40%; mod MR, mod-sev TR, triv PR; mod BAE, mild RV enlargement. b.) TTE 12/27/2018: EF 45%; mod MR, mod-sev TR, triv PR; mod BAE, mild RV enlargement. c.) TTE 08/30/2020: EF 45-50%; mild MR/TR.     Past Surgical History:  Procedure Laterality Date   Bilateral Radical Keratotomy Bilateral 1997   COLONOSCOPY WITH PROPOFOL  N/A 01/11/2016   Procedure: COLONOSCOPY WITH PROPOFOL ;  Surgeon: Lamar ONEIDA Holmes, MD;  Location: [facility] ENDOSCOPY;  Service: Endoscopy;  Laterality: N/A; repeat 3 years, tubular adenoma   DENTAL SURGERY     Patient had implants   ESOPHAGOGASTRODUODENOSCOPY N/A 07/27/2024   Procedure: EGD (ESOPHAGOGASTRODUODENOSCOPY);  Surgeon: Therisa Bi, MD;  Location: [facility] ENDOSCOPY;  Service: Gastroenterology;  Laterality: N/A;   FLEXIBLE BRONCHOSCOPY Left 05/19/2022   Procedure: FLEXIBLE BRONCHOSCOPY;  Surgeon: Tamea Dedra CROME, MD;  Location: [facility] ORS;  Service: Cardiopulmonary;  Laterality: Left;   GUM SURGERY     IR IMAGING GUIDED PORT INSERTION  06/13/2022   IR RADIOLOGIST EVAL & MGMT  10/06/2023   IR RADIOLOGIST EVAL & MGMT  11/19/2023   IR RADIOLOGIST EVAL &  MGMT  08/04/2024   L4 - S1 decompression Left    PROSTATE BIOPSY     TONSILLECTOMY AND ADENOIDECTOMY  age 75   VIDEO BRONCHOSCOPY WITH ENDOBRONCHIAL ULTRASOUND Left 05/19/2022   Procedure: VIDEO BRONCHOSCOPY WITH ENDOBRONCHIAL ULTRASOUND;  Surgeon: Tamea Dedra CROME, MD;  Location: [facility] ORS;  Service: Cardiopulmonary;  Laterality: Left;   XI ROBOTIC ASSISTED INGUINAL HERNIA REPAIR WITH MESH Right 10/21/2021   Procedure: XI ROBOTIC ASSISTED INGUINAL HERNIA REPAIR WITH MESH;  Surgeon: Lane Shope, MD;  Location: [facility] ORS;  Service: General;  Laterality: Right;    Social History   Socioeconomic History   Marital status: Married    Spouse name: Doris   Number of children: 3   Years of education: Not on file   Highest education level: Associate degree: occupational, scientist, product/process development, or vocational program   Occupational History   Occupation: retired  Tobacco Use   Smoking status: Former    Current packs/day: 0.00    Types: Cigarettes    Quit date: 08/26/1971    Years since quitting: 53.0    Passive exposure: Never   Smokeless tobacco: Never   Tobacco comments:    Smoked for about 20 years.  Vaping Use   Vaping status: Never Used  Substance and Sexual Activity   Alcohol use: Not Currently   Drug use: No   Sexual activity: Not Currently  Other Topics Concern   Not on file  Social History Narrative   Not on file   Social Drivers of Health   Tobacco Use: Medium Risk (08/16/2024)   Patient History    Smoking Tobacco Use: Former    Smokeless Tobacco Use: Never    Passive Exposure: Never  Physicist, Medical Strain: Low Risk  (08/01/2024)   Received from [facility]   Overall Financial Resource Strain (CARDIA)    Difficulty of Paying Living Expenses: Not hard at all  Food Insecurity: No Food Insecurity (08/01/2024)   Received from [facility]   Epic    Within the past 12 months, you worried that your food would run out before you got the money to buy more.: Never true    Within the past 12 months, the food you bought just didn't last and you didn't have money to get more.: Never true  Transportation Needs: No Transportation Needs (08/01/2024)   Received from [facility] - Transportation    In the past 12 months, has lack of transportation kept you from medical appointments or from getting medications?: No    Lack of Transportation (Non-Medical): No  Physical Activity: Sufficiently Active (12/23/2022)   Exercise Vital Sign    Days of Exercise per Week: 7 days    Minutes of Exercise per Session: 50 min  Stress: No Stress Concern Present (12/18/2021)   [employer] of Occupational Health - Occupational Stress Questionnaire    Feeling of Stress : Only a little  Social Connections: Moderately Integrated (12/23/2022)    Social Connection and Isolation Panel    Frequency of Communication with Friends and Family: Twice a week    Frequency of Social Gatherings with Friends and Family: Once a week    Attends Religious Services: More than 4 times per year    Active Member of [employer] or Organizations: No    Attends Banker Meetings: Never    Marital Status: Married  Catering Manager Violence: Not At Risk (12/23/2022)   Humiliation, Afraid, Rape, and Kick questionnaire  Fear of Current or Ex-Partner: No    Emotionally Abused: No    Physically Abused: No    Sexually Abused: No  Depression (PHQ2-9): Low Risk (01/06/2023)   Depression (PHQ2-9)    PHQ-2 Score: 0  Alcohol Screen: Low Risk (02/23/2023)   Alcohol Screen    Last Alcohol Screening Score (AUDIT): 0  Housing: Low Risk  (08/01/2024)   [facility] from [facility]   Epic    In the last 12 months, was there a time when you were not able to pay the mortgage or rent on time?: No    In the past 12 months, how many times have you moved where you were living?: 0    At any time in the past 12 months, were you homeless or living in a shelter (including now)?: No  Utilities: Not At Risk (02/02/2024)   Received from [facility]   Epic    In the past 12 months has the electric, gas, oil, or water company threatened to shut off services in your home?: No  Health Literacy: Not on file    Family History  Problem Relation Age of Onset   Cancer Mother        secondary to cancinoma of the jaw from her dipping snuff.   Heart attack Father    CAD Father    Hypertension Sister    Diabetes Son        Type I diabetes   Prostate cancer Neg Hx    Kidney cancer Neg Hx     Current Medications[2]  Physical exam:  Vitals:   08/16/24 0952  BP: (!) 182/85  Pulse: (!) 52  Resp: 20  Temp: 98.6 F (37 C)  SpO2: 100%  Weight: 156 lb 8 oz (71 kg)   Physical Exam Cardiovascular:     Rate and Rhythm: Normal rate and  regular rhythm.     Heart sounds: Normal heart sounds.  Pulmonary:     Effort: Pulmonary effort is normal.     Breath sounds: Normal breath sounds.  Abdominal:     General: Bowel sounds are normal.     Palpations: Abdomen is soft.  Skin:    General: Skin is warm and dry.  Neurological:     Mental Status: He is alert and oriented to person, place, and time.      I have personally reviewed labs listed below:    Latest Ref Rng & Units 08/16/2024    9:40 AM  CMP  Glucose 70 - 99 mg/dL 898   BUN 8 - 23 mg/dL 19   Creatinine 9.38 - 1.24 mg/dL 9.02   Sodium 864 - 854 mmol/L 142   Potassium 3.5 - 5.1 mmol/L 4.0   Chloride 98 - 111 mmol/L 109   CO2 22 - 32 mmol/L 24   Calcium 8.9 - 10.3 mg/dL 8.7   Total Protein 6.5 - 8.1 g/dL 6.1   Total Bilirubin 0.0 - 1.2 mg/dL 0.8   Alkaline Phos 38 - 126 U/L 64   AST 15 - 41 U/L 21   ALT 0 - 44 U/L 14       Latest Ref Rng & Units 08/16/2024    9:40 AM  CBC  WBC 4.0 - 10.5 K/uL 4.0   Hemoglobin 13.0 - 17.0 g/dL 88.3   Hematocrit 60.9 - 52.0 % 34.7   Platelets 150 - 400 K/uL 116    I have personally reviewed Radiology images listed below: No  images are attached to the encounter.  CT CHEST ABDOMEN PELVIS W CONTRAST Result Date: 08/06/2024 EXAM: CT CHEST, ABDOMEN AND PELVIS WITH CONTRAST 08/01/2024 09:41:59 AM TECHNIQUE: CT of the chest, abdomen and pelvis was performed with the administration of 85 mL of iohexol  (OMNIPAQUE ) 300 MG/ML solution. Multiplanar reformatted images are provided for review. Automated exposure control, iterative reconstruction, and/or weight based adjustment of the mA/kV was utilized to reduce the radiation dose to as low as reasonably achievable. COMPARISON: 02/12/2024 CLINICAL HISTORY: Primary malignant neoplasm of left upper lobe of lung on immunology tx. FINDINGS: CHEST: MEDIASTINUM AND LYMPH NODES: Right chest port terminating at the cavoatrial junction. Mild cardiomegaly. Mild 3-vessel coronary atherosclerosis.  The central airways are clear. No mediastinal, hilar or axillary lymphadenopathy. LUNGS AND PLEURA: Mild centrilobular emphysema. Stable medial left upper lobe/perihilar opacity, likely reflecting radiation changes, without interval progression to suggest tumor recurrence viable tumor. Trace bilateral pleural effusions. No pneumothorax. ABDOMEN AND PELVIS: LIVER: The liver is unremarkable. GALLBLADDER AND BILE DUCTS: Gallbladder is unremarkable. No biliary ductal dilatation. SPLEEN: No acute abnormality. PANCREAS: No acute abnormality. ADRENAL GLANDS: No acute abnormality. KIDNEYS, URETERS AND BLADDER: Multiple simple bilateral renal cysts, measuring up to 6.2 cm in the right lower kidney. Per consensus, no follow-up is needed for simple Bosniak type 1 and 2 renal cysts, unless the patient has a malignancy history or risk factors. 2.1 cm hyperdense lesion in the right upper kidney (image 23), with continued decrease in size since prior percutaneous ablation. 2.0 cm hyperdense lesion in the left upper kidney, unchanged. No stones in the kidneys or ureters. No hydronephrosis. No perinephric or periureteral stranding. Urinary bladder is unremarkable. GI AND BOWEL: Stomach demonstrates no acute abnormality. Mild to moderate colonic stool burden. There is no bowel obstruction. REPRODUCTIVE ORGANS: Prostatomegaly, suggesting BPH. PERITONEUM AND RETROPERITONEUM: No ascites. No free air. VASCULATURE: Aorta is normal in caliber. Mild thoracic aortic atherosclerosis. ABDOMINAL AND PELVIS LYMPH NODES: No lymphadenopathy. BONES AND SOFT TISSUES: Degenerative changes of the visualized thoracolumbar spine. No acute osseous abnormality. No focal soft tissue abnormality. IMPRESSION: 1. Stable medial left upper lobe/perihilar opacity, likely post-radiation change. 2. No findings suspicious for metastatic disease. 3. Improving right upper pole hyperdense renal lesion, status post percutaneous ablation. 4. Stable 2.0 cm left upper  pole hyperdense renal lesion, chronic. Electronically signed by: Pinkie Pebbles MD 08/06/2024 06:57 PM EST RP Workstation: [id number]   IR Radiologist Eval & Mgmt Result Date: 08/04/2024 EXAM: ESTABLISHED PATIENT OFFICE VISIT CHIEF COMPLAINT: SEE NOTE IN EPIC HISTORY OF PRESENT ILLNESS: SEE NOTE IN EPIC REVIEW OF SYSTEMS: SEE NOTE IN EPIC PHYSICAL EXAMINATION: SEE NOTE IN EPIC ASSESSMENT AND PLAN: SEE NOTE IN EPIC Electronically Signed   By: Wilkie Lent M.D.   On: 08/04/2024 08:36     Assessment and plan- Patient is a 89 y.o. 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 CT shows post-radiation scarring, no new malignancy or lymphadenopathy. - Ordered repeat CT scan in six months.  Left renal cell carcinoma post-ablation Post-ablation with reduced left renal mass size. No progression or recurrence. - Ordered repeat CT scan in six months.  Stable acquired kidney cyst Stable on imaging without concerning features. - Ordered repeat CT scan in six months.  Indwelling vascular access  port management He chose to retain it. - Scheduled port flush every three months.         Visit Diagnosis 1. Primary malignant neoplasm of left upper lobe of lung (HCC)   2. Encounter for follow-up surveillance of lung cancer      Dr. Annah Skene, MD, MPH [facility] [phone] 08/16/2024 12:20 PM                   [1]  Allergies Allergen Reactions   Cefdinir  Nausea Only    hypersensitivity to smell   Doxycycline      Sun sensitivity   Prednisone     Hallucinations    Sertraline  Other (See Comments)    Hallucinations   [2]  Current Outpatient Medications:    amLODipine   (NORVASC ) 5 MG tablet, TAKE 1 TABLET (5 MG TOTAL) BY MOUTH DAILY., Disp: 90 tablet, Rfl: 3   benazepril  (LOTENSIN ) 40 MG tablet, Take 1 tablet (40 mg total) by mouth daily., Disp: 90 tablet, Rfl: 1   carvedilol  (COREG ) 6.25 MG tablet, TAKE 1 TABLET (6.25 MG TOTAL) BY MOUTH 2 (TWO) TIMES DAILY., Disp: 180 tablet, Rfl: 3   Cyanocobalamin  (VITAMIN B 12 PO), Take 1,000 mcg by mouth 2 (two) times daily., Disp: , Rfl:    doxazosin  (CARDURA ) 8 MG tablet, Take 8 mg by mouth at bedtime., Disp: , Rfl:    finasteride  (PROSCAR ) 5 MG tablet, TAKE 1 TABLET BY MOUTH EVERY DAY, Disp: 90 tablet, Rfl: 1   gabapentin  (NEURONTIN ) 300 MG capsule, TAKE 1 CAPSULE EVERY MORNING, 1 CAPSULE IN THE AFTERNOON AND 2 CAPSULES AT BEDTIME, Disp: 360 capsule, Rfl: 3   Magnesium 500 MG TABS, Take 500 mg by mouth daily., Disp: , Rfl:    Multiple Vitamin (MULTI-VITAMIN) tablet, Take 1 tablet by mouth daily. , Disp: , Rfl:    oxyCODONE  (OXY IR/ROXICODONE ) 5 MG immediate release tablet, Take 1 tablet (5 mg total) by mouth every 6 (six) hours as needed for severe pain., Disp: 90 tablet, Rfl: 0   tacrolimus  (PROTOPIC ) 0.1 % ointment, Apply to irritated area on the buttock qd-bid, Disp: 100 g, Rfl: 1   triamterene -hydrochlorothiazide (MAXZIDE-25) 37.5-25 MG tablet, TAKE 1/2 TABLET EVERY DAY, Disp: 45 tablet, Rfl: 1   warfarin (COUMADIN ) 4 MG tablet, Take 4 mg by mouth See admin instructions. Take 4 mg by mouth on Tuesday and Thursday - Sunday, Disp: , Rfl:    warfarin (COUMADIN ) 5 MG tablet, TAKE 1 TABLET EVERY DAY (Patient taking differently: Take 5 mg by mouth See admin instructions. Take 5 mg by mouth Monday and Wednesday.), Disp: 90 tablet, Rfl: 3   ketoconazole  (NIZORAL ) 2 % cream, Apply to dry skin on his forehead and nose qd-bid (Patient not taking: Reported on 08/16/2024), Disp: 30 g, Rfl: 11   mometasone  (ELOCON ) 0.1 % cream, Apply 1 Application topically as directed. Mometasone  cream bid up to 5 days per week (M-F only) aa buttocks  (Patient not taking: Reported on 08/16/2024), Disp: 45 g, Rfl: 1 No current facility-administered medications for this visit.  Facility-Administered Medications Ordered in Other Visits:    heparin  lock flush 100 UNIT/ML injection, , , ,   "

## 2024-08-23 ENCOUNTER — Ambulatory Visit: Payer: Self-pay | Admitting: Urgent Care

## 2024-08-23 ENCOUNTER — Other Ambulatory Visit: Payer: Self-pay

## 2024-08-23 ENCOUNTER — Encounter
Admission: RE | Disposition: A | Discharge status: Home / Self Care | Payer: Self-pay | Source: Home / Self Care | Attending: Surgery

## 2024-08-23 ENCOUNTER — Ambulatory Visit
Admission: RE | Admit: 2024-08-23 | Discharge: 2024-08-23 | Disposition: A | Discharge status: Home / Self Care | Source: Home / Self Care | Attending: Surgery | Admitting: Surgery

## 2024-08-23 ENCOUNTER — Encounter: Payer: Self-pay | Admitting: Oncology

## 2024-08-23 DIAGNOSIS — Z87891 Personal history of nicotine dependence: Secondary | ICD-10-CM | POA: Insufficient documentation

## 2024-08-23 DIAGNOSIS — R2241 Localized swelling, mass and lump, right lower limb: Secondary | ICD-10-CM | POA: Diagnosis present

## 2024-08-23 DIAGNOSIS — K219 Gastro-esophageal reflux disease without esophagitis: Secondary | ICD-10-CM | POA: Diagnosis not present

## 2024-08-23 DIAGNOSIS — I10 Essential (primary) hypertension: Secondary | ICD-10-CM | POA: Insufficient documentation

## 2024-08-23 DIAGNOSIS — Z01812 Encounter for preprocedural laboratory examination: Secondary | ICD-10-CM

## 2024-08-23 DIAGNOSIS — C7651 Malignant neoplasm of right lower limb: Secondary | ICD-10-CM | POA: Insufficient documentation

## 2024-08-23 DIAGNOSIS — I4891 Unspecified atrial fibrillation: Secondary | ICD-10-CM | POA: Diagnosis not present

## 2024-08-23 DIAGNOSIS — Z7901 Long term (current) use of anticoagulants: Secondary | ICD-10-CM

## 2024-08-23 DIAGNOSIS — I2510 Atherosclerotic heart disease of native coronary artery without angina pectoris: Secondary | ICD-10-CM | POA: Insufficient documentation

## 2024-08-23 HISTORY — DX: Unilateral inguinal hernia, without obstruction or gangrene, not specified as recurrent: K40.90

## 2024-08-23 HISTORY — PX: EXCISION MASS LOWER EXTREMETIES: SHX6705

## 2024-08-23 HISTORY — DX: Other intervertebral disc degeneration, thoracolumbar region: M51.35

## 2024-08-23 LAB — PROTIME-INR
INR: 1.5 — ABNORMAL HIGH (ref 0.8–1.2)
Prothrombin Time: 18.8 s — ABNORMAL HIGH (ref 11.4–15.2)

## 2024-08-23 SURGERY — EXCISION MASS LOWER EXTREMITIES
Anesthesia: General | Site: Leg Lower | Laterality: Right

## 2024-08-23 MED ORDER — PHENYLEPHRINE HCL-NACL 20-0.9 MG/250ML-% IV SOLN
INTRAVENOUS | Status: AC
  Filled 2024-08-23: qty 250

## 2024-08-23 MED ORDER — CHLORHEXIDINE GLUCONATE 0.12 % MT SOLN
OROMUCOSAL | Status: AC
  Filled 2024-08-23: qty 15

## 2024-08-23 MED ORDER — BUPIVACAINE-EPINEPHRINE (PF) 0.5% -1:200000 IJ SOLN
INTRAMUSCULAR | Status: DC | PRN
  Administered 2024-08-23: 4 mL

## 2024-08-23 MED ORDER — PROPOFOL 10 MG/ML IV BOLUS
INTRAVENOUS | Status: AC
  Filled 2024-08-23: qty 40

## 2024-08-23 MED ORDER — ACETAMINOPHEN 10 MG/ML IV SOLN
INTRAVENOUS | Status: AC
  Filled 2024-08-23: qty 100

## 2024-08-23 MED ORDER — ACETAMINOPHEN 10 MG/ML IV SOLN
INTRAVENOUS | Status: DC | PRN
  Administered 2024-08-23: 1000 mg via INTRAVENOUS

## 2024-08-23 MED ORDER — VANCOMYCIN HCL IN DEXTROSE 1-5 GM/200ML-% IV SOLN
1000.0000 mg | INTRAVENOUS | Status: AC
  Administered 2024-08-23: 1000 mg via INTRAVENOUS

## 2024-08-23 MED ORDER — BUPIVACAINE-EPINEPHRINE (PF) 0.5% -1:200000 IJ SOLN
INTRAMUSCULAR | Status: AC
  Filled 2024-08-23: qty 30

## 2024-08-23 MED ORDER — VANCOMYCIN HCL IN DEXTROSE 1-5 GM/200ML-% IV SOLN
INTRAVENOUS | Status: AC
  Filled 2024-08-23: qty 200

## 2024-08-23 MED ORDER — PROPOFOL 500 MG/50ML IV EMUL
INTRAVENOUS | Status: DC | PRN
  Administered 2024-08-23: 100 ug/kg/min via INTRAVENOUS

## 2024-08-23 MED ORDER — ACETAMINOPHEN 325 MG PO TABS
650.0000 mg | ORAL_TABLET | Freq: Three times a day (TID) | ORAL | 0 refills | Status: AC | PRN
  Filled 2024-08-23: qty 40, 7d supply, fill #0

## 2024-08-23 MED ORDER — CHLORHEXIDINE GLUCONATE 0.12 % MT SOLN
15.0000 mL | Freq: Once | OROMUCOSAL | Status: AC
  Administered 2024-08-23: 15 mL via OROMUCOSAL

## 2024-08-23 MED ORDER — FENTANYL CITRATE (PF) 100 MCG/2ML IJ SOLN
INTRAMUSCULAR | Status: AC
  Filled 2024-08-23: qty 2

## 2024-08-23 MED ORDER — EPHEDRINE SULFATE-NACL 50-0.9 MG/10ML-% IV SOSY
PREFILLED_SYRINGE | INTRAVENOUS | Status: DC | PRN
  Administered 2024-08-23 (×2): 5 mg via INTRAVENOUS

## 2024-08-23 MED ORDER — ORAL CARE MOUTH RINSE: 15.0000 mL | Freq: Once | OROMUCOSAL | Status: AC

## 2024-08-23 MED ORDER — PROPOFOL 10 MG/ML IV BOLUS
INTRAVENOUS | Status: DC | PRN
  Administered 2024-08-23 (×2): 20 mg via INTRAVENOUS

## 2024-08-23 MED ORDER — 0.9 % SODIUM CHLORIDE (POUR BTL) OPTIME
TOPICAL | Status: DC | PRN
  Administered 2024-08-23: 500 mL

## 2024-08-23 MED ORDER — LACTATED RINGERS IV SOLN: INTRAVENOUS | Status: DC

## 2024-08-23 MED ORDER — PROPOFOL 1000 MG/100ML IV EMUL
INTRAVENOUS | Status: AC
  Filled 2024-08-23: qty 100

## 2024-08-23 MED ORDER — CHLORHEXIDINE GLUCONATE CLOTH 2 % EX PADS: 6.0000 | MEDICATED_PAD | Freq: Once | CUTANEOUS | Status: DC

## 2024-08-23 SURGICAL SUPPLY — 22 items
BLADE SURG 15 STRL LF DISP TIS (BLADE) ×1 IMPLANT
DERMABOND ADVANCED .7 DNX12 (GAUZE/BANDAGES/DRESSINGS) ×1 IMPLANT
DRAPE LAPAROTOMY 100X77 ABD (DRAPES) ×1 IMPLANT
DRSG MEPILEX FLEX 3X3 (GAUZE/BANDAGES/DRESSINGS) IMPLANT
DRSG XEROFORM 1X8 (GAUZE/BANDAGES/DRESSINGS) IMPLANT
ELECTRODE REM PT RTRN 9FT ADLT (ELECTROSURGICAL) ×2 IMPLANT
GAUZE 4X4 16PLY ~~LOC~~+RFID DBL (SPONGE) IMPLANT
GLOVE BIOGEL PI IND STRL 7.0 (GLOVE) ×1 IMPLANT
GLOVE SURG SYN 6.5 PF PI (GLOVE) ×3 IMPLANT
GOWN STRL REUS W/ TWL LRG LVL3 (GOWN DISPOSABLE) ×3 IMPLANT
KIT TURNOVER KIT A (KITS) ×1 IMPLANT
MANIFOLD NEPTUNE II (INSTRUMENTS) ×1 IMPLANT
NEEDLE HYPO 22X1.5 SAFETY MO (MISCELLANEOUS) ×1 IMPLANT
NS IRRIG 500ML POUR BTL (IV SOLUTION) ×1 IMPLANT
PACK BASIN MINOR ARMC (MISCELLANEOUS) ×1 IMPLANT
SOLN STERILE WATER 500 ML (IV SOLUTION) ×1 IMPLANT
SUT MNCRL AB 4-0 PS2 18 (SUTURE) ×1 IMPLANT
SUT SILK 3 0 SH 30 (SUTURE) IMPLANT
SUT VIC AB 3-0 SH 27X BRD (SUTURE) IMPLANT
SYR 10ML LL (SYRINGE) ×1 IMPLANT
SYR BULB IRRIG 60ML STRL (SYRINGE) ×1 IMPLANT
TRAP FLUID SMOKE EVACUATOR (MISCELLANEOUS) ×1 IMPLANT

## 2024-08-23 NOTE — Op Note (Signed)
 Pre-Op Dx: right lower leg mass Post-Op Dx: same Anesthesia: MAC EBL: Complications:  none apparent Specimen: right lower leg mass Procedure: excisional biopsy of right lower leg mass Surgeon: Tye  Indications for procedure: See H&P  Description of Procedure:  Consent obtained, time out performed.  Patient placed in supine position.  Area sterilized and draped in usual position.  Local infused to area previously marked.  5cm incision made through dermis with 15blade and irregular shaped, soft, almost fungating type mass noted in subcutaneous layer.  The  4cm x 4.3cm x 3.8cm mass then removed from surrounding tissue completely using electrocautery, passed off field pending pathology.    Portion of the mass noted to be attached to dermis through fiber type adhesions, and was abutting the deeper muscle but came off the base with minimal traction.  No visible vessel like structures on the deeper aspect but some bleeding was noted during separation of the mass from overlying dermis. Mass friable but able to remove all grossly abnormal tissue within the field in one piece.  Wound hemostasis noted, then closed in two layer fashion with 3-0 vicryl in interrupted fashion for deep dermal layer, then running 4-0 monocryl in subcuticular fashion for epidermal layer.  Wound then dressed with dermabond.  Pt tolerated procedure well, and transferred to PACU in stable condition. Sponge and instrument count correct at end of procedure.

## 2024-08-23 NOTE — Interval H&P Note (Signed)
 History and Physical Interval Note:  08/23/2024 8:24 AM  Casey Gallegos  has presented today for surgery, with the diagnosis of lower leg mass, right R44.41.  The various methods of treatment have been discussed with the patient and family. After consideration of risks, benefits and other options for treatment, the patient has consented to  Procedures: EXCISION MASS LOWER EXTREMITIES (Right) as a surgical intervention.  The patient's history has been reviewed, patient examined, no change in status, stable for surgery.  I have reviewed the patient's chart and labs.  Questions were answered to the patient's satisfaction.     Kylin Dubs Tye

## 2024-08-23 NOTE — Discharge Instructions (Addendum)
 Removal, Care After This sheet gives you information about how to care for yourself after your procedure. Your health care provider may also give you more specific instructions. If you have problems or questions, contact your health care provider. What can I expect after the procedure? After the procedure, it is common to have: Soreness. Bruising. Itching. Follow these instructions at home: site care Follow instructions from your health care provider about how to take care of your site. Make sure you: KEEP CURRENT DRESSING ON FOR 48HRS, THEN OK TO REMOVE AND SHOWER Wash your hands with soap and water before and after you change your bandage (dressing). If soap and water are not available, use hand sanitizer. Leave stitches (sutures), skin glue, or adhesive strips in place. These skin closures may need to stay in place for 2 weeks or longer. If adhesive strip edges start to loosen and curl up, you may trim the loose edges. Do not remove adhesive strips completely unless your health care provider tells you to do that. If the area bleeds or bruises, apply gentle pressure for 10 minutes.   Check your site every day for signs of infection. Check for: Redness, swelling, or pain. Fluid or blood. Warmth. Pus or a bad smell.  General instructions Rest and then return to your normal activities as told by your health care provider.  tylenol  as needed for discomfort.     Use percoct only when tylenol   is not enough to control pain.  325-650mg  every 8hrs to max of 3000mg /24hrs (including the 325mg  in every norco dose) for the tylenol .   RESUME COUMADIN  IN 48HRS Keep all follow-up visits as told by your health care provider. This is important. Contact a health care provider if: You have redness, swelling, or pain around your site. You have fluid or blood coming from your site. Your site feels warm to the touch. You have pus or a bad smell coming from your site. You have a fever. Your sutures,  skin glue, or adhesive strips loosen or come off sooner than expected. Get help right away if: You have bleeding that does not stop with pressure or a dressing. Summary After the procedure, it is common to have some soreness, bruising, and itching at the site. Follow instructions from your health care provider about how to take care of your site. Check your site every day for signs of infection. Contact a health care provider if you have redness, swelling, or pain around your site, or your site feels warm to the touch. Keep all follow-up visits as told by your health care provider. This is important. This information is not intended to replace advice given to you by your health care provider. Make sure you discuss any questions you have with your health care provider. Document Released: 09/07/2015 Document Revised: 02/08/2018 Document Reviewed: 02/08/2018 Elsevier Interactive Patient Education  [employer].

## 2024-08-23 NOTE — Anesthesia Postprocedure Evaluation (Signed)
"   Anesthesia Post Note  [name]  Procedure(s) Performed: EXCISION MASS LOWER EXTREMITIES (Right: Leg Lower)  Patient location during evaluation: PACU Anesthesia Type: General Level of consciousness: awake and alert Pain management: pain level controlled Vital Signs Assessment: post-procedure vital signs reviewed and stable Respiratory status: spontaneous breathing, nonlabored ventilation, respiratory function stable and patient connected to nasal cannula oxygen  Cardiovascular status: blood pressure returned to baseline and stable Postop Assessment: no apparent nausea or vomiting Anesthetic complications: no   No notable events documented.   Last Vitals:  Vitals:   08/23/24 0815 08/23/24 0830  BP: 120/68 (!) 150/86  Pulse: (!) 47 (!) 48  Resp: 10 17  Temp: (!) 36.3 C   SpO2: 100% 100%    Last Pain:  Vitals:   08/23/24 0815  TempSrc:   PainSc: Asleep                 [name]      "

## 2024-08-23 NOTE — Transfer of Care (Signed)
 Immediate Anesthesia Transfer of Care Note  [name]  Procedure(s) Performed: EXCISION MASS LOWER EXTREMITIES (Right: Leg Lower)  Patient Location: PACU  Anesthesia Type:General  Level of Consciousness: drowsy  Airway & Oxygen  Therapy: Patient Spontanous Breathing and Patient connected to face mask oxygen   Post-op Assessment: Report given to RN  Post vital signs: stable  Last Vitals:  Vitals Value Taken Time  BP    Temp    Pulse    Resp    SpO2      Last Pain:  Vitals:   08/23/24 0614  TempSrc: Tympanic  PainSc: 0-No pain         Complications: No notable events documented.

## 2024-08-23 NOTE — Anesthesia Preprocedure Evaluation (Addendum)
 "                                  Anesthesia Evaluation  Patient identified by MRN, date of birth, ID band Patient awake    Reviewed: Allergy & Precautions, NPO status , Patient's Chart, lab work & pertinent test results  History of Anesthesia Complications Negative for: history of anesthetic complications  Airway Mallampati: III  TM Distance: >3 FB Neck ROM: full    Dental  (+) Chipped   Pulmonary COPD, former smoker   Pulmonary exam normal        Cardiovascular hypertension, + CAD, + Peripheral Vascular Disease, +CHF and + DOE  + dysrhythmias Atrial Fibrillation   ECHO 2022 INTERPRETATION  MILD LV SYSTOLIC DYSFUNCTION (See above)  NORMAL RIGHT VENTRICULAR SYSTOLIC FUNCTION  MILD VALVULAR REGURGITATION (See above)  NO VALVULAR STENOSIS  MILD MR, TR  EF 45-50%   Per cardiology, this patient is optimized for surgery and may proceed with the planned procedural course with a MODERATE risk of significant perioperative cardiovascular complications.    Neuro/Psych  Neuromuscular disease  negative psych ROS   GI/Hepatic Neg liver ROS,GERD  ,,  Endo/Other  negative endocrine ROS    Renal/GU Renal disease  negative genitourinary   Musculoskeletal   Abdominal   Peds  Hematology  (+) Blood dyscrasia, anemia   Anesthesia Other Findings Past Medical History: No date: A-fib [facility])     Comment:  a.) CHA2DS2-VASc = 6 (age x2, HTN, DVT x2, vascular               disease history). b.) rate/rhythm maintained on oral               carvedilol ; chronically anticoagulated using daily               warfarin. No date: Actinic keratosis No date: Adenomatous polyp No date: Allergic rhinitis No date: Anemia No date: Anticoagulated on warfarin No date: Aortic atherosclerosis No date: Benign essential hypertension No date: Bilateral renal cysts No date: Bladder calculi No date: BPH with urinary obstruction No date: CAD (coronary artery disease) No date: Chronic  venous insufficiency No date: DDD (degenerative disc disease), thoracolumbar     Comment:  a.) s/p LEFT laminectomy with L4-S1 decompression No date: Diverticulosis No date: DOE (dyspnea on exertion) No date: DVT (deep venous thrombosis) (HCC) No date: Elevated PSA No date: GERD (gastroesophageal reflux disease) No date: HLD (hyperlipidemia) No date: Inguinal hernia No date: Lipoma of arm No date: Lipomatosis 11/22/2015: NICM (nonischemic cardiomyopathy) (HCC)     Comment:  a.) TTE 11/22/2015: EF 40%. b.) TTE 12/27/2018: EF 45%.               c.) TTE 08/30/2020: EF 45-50%. No date: Night sweats No date: Obesity No date: Osteoarthritis No date: Pulmonary emphysema (HCC) No date: RBBB (right bundle branch block) No date: Right inguinal hernia 04/24/2022: Right renal mass     Comment:  a.) CT A/P 04/24/22: 2.7 x 2.8 cm enhancing septation               lateral aspect RIGHT kidney concerning for cystic renal               neoplasm; b.) CT chest 05/15/22: 3 cm RIGHT renal mass;               c.) CT A/P 08/17/22: 2.9 x 2.6 cm exophytic  mass; d.) CT               A/P 11/19/22: 3.0 x 2.6 cm solid & cystic mass peripheral              superior RIGHT renal pole c/w RCC (Bosniak IV); e.) CT               A/P 03/30/23: 3.1 x 2.6 cm; f.) CT A/P 07/21/23: 3.3 x               2.9 cm 04/24/2022: Squamous cell lung cancer (HCC)     Comment:  a.)  CT chest 04/24/2022: Multilobulated perihilar mass               measuring 5.3 x 3.7 x 2.8 cm with associated LEFT hilar               and mediastinal LAD; b.)  PET CT 05/07/2022:               Hypermetabolic LEFT upper lobe lung mass (max SUV 15.1);               ipsilateral nodal metastasis --> presuming a NSC               histology, consistent with stage IIIb pulmonary neoplasm               (T3 N2 M0) --> path (+) NSCLC (SCC) No date: Thoracic spondylosis 11/22/2015: VHD (valvular heart disease)     Comment:  a.) TTE 11/22/2015: EF 40%; mod MR,  mod-sev TR, triv PR;              mod BAE, mild RV enlargement. b.) TTE 12/27/2018: EF 45%;              mod MR, mod-sev TR, triv PR; mod BAE, mild RV               enlargement. c.) TTE 08/30/2020: EF 45-50%; mild MR/TR.  Past Surgical History: 1997: Bilateral Radical Keratotomy; Bilateral 01/11/2016: COLONOSCOPY WITH PROPOFOL ; N/A     Comment:  Procedure: COLONOSCOPY WITH PROPOFOL ;  Surgeon: Lamar ONEIDA Holmes, MD;  Location: [facility] ENDOSCOPY;  Service:               Endoscopy;  Laterality: N/A; repeat 3 years, tubular               adenoma No date: DENTAL SURGERY     Comment:  Patient had implants 07/27/2024: ESOPHAGOGASTRODUODENOSCOPY; N/A     Comment:  Procedure: EGD (ESOPHAGOGASTRODUODENOSCOPY);  Surgeon:               Therisa Bi, MD;  Location: [facility] ENDOSCOPY;  Service:               Gastroenterology;  Laterality: N/A; 05/19/2022: FLEXIBLE BRONCHOSCOPY; Left     Comment:  Procedure: FLEXIBLE BRONCHOSCOPY;  Surgeon: Tamea Dedra CROME, MD;  Location: [facility] ORS;  Service:               Cardiopulmonary;  Laterality: Left; No date: GUM SURGERY 06/13/2022: IR IMAGING GUIDED PORT INSERTION 10/06/2023: IR RADIOLOGIST EVAL & MGMT 11/19/2023: IR RADIOLOGIST EVAL & MGMT 08/04/2024: IR RADIOLOGIST EVAL & MGMT No date: L4 - S1 decompression; Left No date: PROSTATE BIOPSY age 42: TONSILLECTOMY AND ADENOIDECTOMY 05/19/2022: VIDEO BRONCHOSCOPY  WITH ENDOBRONCHIAL ULTRASOUND; Left     Comment:  Procedure: VIDEO BRONCHOSCOPY WITH ENDOBRONCHIAL               ULTRASOUND;  Surgeon: Tamea Dedra CROME, MD;  Location:               [facility] ORS;  Service: Cardiopulmonary;  Laterality: Left; 10/21/2021: XI ROBOTIC ASSISTED INGUINAL HERNIA REPAIR WITH MESH;  Right     Comment:  Procedure: XI ROBOTIC ASSISTED INGUINAL HERNIA REPAIR               WITH MESH;  Surgeon: Lane Shope, MD;  Location:               [facility] ORS;  Service: General;  Laterality: Right;  BMI    Body Mass  Index: 22.45 kg/m      Reproductive/Obstetrics negative OB ROS                              Anesthesia Physical Anesthesia Plan  ASA: 4  Anesthesia Plan: General   Post-op Pain Management: Toradol IV (intra-op)* and Ofirmev  IV (intra-op)*   Induction: Intravenous  PONV Risk Score and Plan: 2 and Propofol  infusion and TIVA  Airway Management Planned: Natural Airway and Nasal Cannula  Additional Equipment:   Intra-op Plan:   Post-operative Plan:   Informed Consent: I have reviewed the patients History and Physical, chart, labs and discussed the procedure including the risks, benefits and alternatives for the proposed anesthesia with the patient or authorized representative who has indicated his/her understanding and acceptance.     Dental Advisory Given  Plan Discussed with: Anesthesiologist, CRNA and Surgeon  Anesthesia Plan Comments: (Patient consented for risks of anesthesia including but not limited to:  - adverse reactions to medications - risk of airway placement if required - damage to eyes, teeth, lips or other oral mucosa - nerve damage due to positioning  - sore throat or hoarseness - Damage to heart, brain, nerves, lungs, other parts of body or loss of life  Patient voiced understanding and assent.)         Anesthesia Quick Evaluation  "

## 2024-08-24 ENCOUNTER — Encounter: Payer: Self-pay | Admitting: Surgery

## 2024-08-25 ENCOUNTER — Encounter: Payer: Self-pay | Admitting: Oncology

## 2024-08-26 ENCOUNTER — Encounter: Payer: Self-pay | Admitting: Oncology

## 2024-08-30 LAB — SURGICAL PATHOLOGY

## 2024-09-01 ENCOUNTER — Ambulatory Visit: Admitting: Radiation Oncology

## 2024-10-07 ENCOUNTER — Inpatient Hospital Stay: Payer: HMO

## 2024-11-14 ENCOUNTER — Inpatient Hospital Stay: Payer: Self-pay | Attending: Oncology

## 2025-01-17 ENCOUNTER — Ambulatory Visit

## 2025-02-14 ENCOUNTER — Inpatient Hospital Stay

## 2025-02-14 ENCOUNTER — Inpatient Hospital Stay: Admitting: Oncology

## 2025-03-28 ENCOUNTER — Ambulatory Visit: Admitting: Dermatology
